# Patient Record
Sex: Male | Born: 1935 | Race: Black or African American | Hispanic: No | Smoking: Never smoker
Health system: Southern US, Community
[De-identification: ages and names within clinical notes are randomized; demographics above are authoritative.]

## PROBLEM LIST (undated history)

## (undated) DIAGNOSIS — M1990 Unspecified osteoarthritis, unspecified site: Secondary | ICD-10-CM

## (undated) DIAGNOSIS — D649 Anemia, unspecified: Secondary | ICD-10-CM

## (undated) DIAGNOSIS — R600 Localized edema: Secondary | ICD-10-CM

## (undated) DIAGNOSIS — E119 Type 2 diabetes mellitus without complications: Secondary | ICD-10-CM

## (undated) DIAGNOSIS — I209 Angina pectoris, unspecified: Secondary | ICD-10-CM

## (undated) DIAGNOSIS — K219 Gastro-esophageal reflux disease without esophagitis: Secondary | ICD-10-CM

## (undated) DIAGNOSIS — D72820 Lymphocytosis (symptomatic): Secondary | ICD-10-CM

## (undated) DIAGNOSIS — C9110 Chronic lymphocytic leukemia of B-cell type not having achieved remission: Secondary | ICD-10-CM

## (undated) DIAGNOSIS — Z8719 Personal history of other diseases of the digestive system: Secondary | ICD-10-CM

## (undated) DIAGNOSIS — Z8489 Family history of other specified conditions: Secondary | ICD-10-CM

## (undated) HISTORY — DX: Chronic lymphocytic leukemia of B-cell type not having achieved remission: C91.10

## (undated) HISTORY — DX: Unspecified osteoarthritis, unspecified site: M19.90

## (undated) HISTORY — PX: EYE SURGERY: SHX253

## (undated) HISTORY — DX: Lymphocytosis (symptomatic): D72.820

## (undated) HISTORY — PX: CATARACT EXTRACTION W/ INTRAOCULAR LENS  IMPLANT, BILATERAL: SHX1307

---

## 1999-01-14 ENCOUNTER — Encounter: Admission: RE | Admit: 1999-01-14 | Discharge: 1999-04-14 | Payer: Self-pay | Admitting: Endocrinology

## 2004-04-04 ENCOUNTER — Ambulatory Visit: Payer: Self-pay | Admitting: Oncology

## 2004-09-16 ENCOUNTER — Encounter (INDEPENDENT_AMBULATORY_CARE_PROVIDER_SITE_OTHER): Payer: Self-pay | Admitting: *Deleted

## 2004-09-16 ENCOUNTER — Ambulatory Visit (HOSPITAL_COMMUNITY): Admission: RE | Admit: 2004-09-16 | Discharge: 2004-09-16 | Payer: Self-pay | Admitting: *Deleted

## 2005-04-03 ENCOUNTER — Ambulatory Visit: Payer: Self-pay | Admitting: Oncology

## 2005-04-06 ENCOUNTER — Ambulatory Visit: Payer: Self-pay | Admitting: Oncology

## 2005-10-01 ENCOUNTER — Ambulatory Visit: Payer: Self-pay | Admitting: Oncology

## 2006-03-31 ENCOUNTER — Ambulatory Visit: Payer: Self-pay | Admitting: Oncology

## 2006-04-05 LAB — COMPREHENSIVE METABOLIC PANEL
Alkaline Phosphatase: 51 U/L (ref 39–117)
Creatinine, Ser: 1.03 mg/dL (ref 0.40–1.50)
Glucose, Bld: 154 mg/dL — ABNORMAL HIGH (ref 70–99)
Sodium: 140 mEq/L (ref 135–145)
Total Bilirubin: 0.3 mg/dL (ref 0.3–1.2)
Total Protein: 6.4 g/dL (ref 6.0–8.3)

## 2006-04-05 LAB — CBC WITH DIFFERENTIAL/PLATELET
Eosinophils Absolute: 0.9 10*3/uL — ABNORMAL HIGH (ref 0.0–0.5)
LYMPH%: 52.1 % — ABNORMAL HIGH (ref 14.0–48.0)
MCHC: 34 g/dL (ref 32.0–35.9)
MCV: 88.3 fL (ref 81.6–98.0)
MONO%: 6.1 % (ref 0.0–13.0)
NEUT#: 3.8 10*3/uL (ref 1.5–6.5)
Platelets: 257 10*3/uL (ref 145–400)
RBC: 4.65 10*6/uL (ref 4.20–5.71)

## 2006-04-05 LAB — LACTATE DEHYDROGENASE: LDH: 136 U/L (ref 94–250)

## 2007-02-16 ENCOUNTER — Emergency Department (HOSPITAL_COMMUNITY): Admission: EM | Admit: 2007-02-16 | Discharge: 2007-02-16 | Payer: Self-pay | Admitting: Emergency Medicine

## 2007-03-31 ENCOUNTER — Ambulatory Visit: Payer: Self-pay | Admitting: Oncology

## 2007-04-04 LAB — CBC WITH DIFFERENTIAL/PLATELET
BASO%: 0.3 % (ref 0.0–2.0)
EOS%: 3.7 % (ref 0.0–7.0)
MCH: 30.8 pg (ref 28.0–33.4)
MCV: 89.3 fL (ref 81.6–98.0)
MONO%: 5.5 % (ref 0.0–13.0)
NEUT#: 2.8 10*3/uL (ref 1.5–6.5)
RBC: 4.51 10*6/uL (ref 4.20–5.71)
RDW: 14.3 % (ref 11.2–14.6)

## 2007-04-04 LAB — COMPREHENSIVE METABOLIC PANEL
AST: 22 U/L (ref 0–37)
Albumin: 4 g/dL (ref 3.5–5.2)
Alkaline Phosphatase: 55 U/L (ref 39–117)
Potassium: 4.3 mEq/L (ref 3.5–5.3)
Sodium: 138 mEq/L (ref 135–145)
Total Protein: 6.2 g/dL (ref 6.0–8.3)

## 2008-03-30 ENCOUNTER — Ambulatory Visit: Payer: Self-pay | Admitting: Oncology

## 2008-04-03 ENCOUNTER — Encounter (HOSPITAL_COMMUNITY): Payer: Self-pay | Admitting: Oncology

## 2008-04-03 ENCOUNTER — Other Ambulatory Visit: Admission: RE | Admit: 2008-04-03 | Discharge: 2008-04-03 | Payer: Self-pay | Admitting: Oncology

## 2008-04-03 LAB — CBC WITH DIFFERENTIAL/PLATELET
Basophils Absolute: 0.1 10*3/uL (ref 0.0–0.1)
EOS%: 2 % (ref 0.0–7.0)
Eosinophils Absolute: 0.4 10*3/uL (ref 0.0–0.5)
HCT: 40.5 % (ref 38.4–49.9)
HGB: 13.7 g/dL (ref 13.0–17.1)
MCH: 30.6 pg (ref 27.2–33.4)
MONO#: 0.8 10*3/uL (ref 0.1–0.9)
NEUT#: 2.9 10*3/uL (ref 1.5–6.5)
NEUT%: 16.4 % — ABNORMAL LOW (ref 39.0–75.0)
RDW: 13.6 % (ref 11.0–14.6)
WBC: 17.9 10*3/uL — ABNORMAL HIGH (ref 4.0–10.3)
lymph#: 13.7 10*3/uL — ABNORMAL HIGH (ref 0.9–3.3)

## 2008-04-03 LAB — COMPREHENSIVE METABOLIC PANEL
Albumin: 4.1 g/dL (ref 3.5–5.2)
Alkaline Phosphatase: 57 U/L (ref 39–117)
BUN: 16 mg/dL (ref 6–23)
CO2: 30 mEq/L (ref 19–32)
Calcium: 9 mg/dL (ref 8.4–10.5)
Chloride: 101 mEq/L (ref 96–112)
Glucose, Bld: 191 mg/dL — ABNORMAL HIGH (ref 70–99)
Potassium: 4.2 mEq/L (ref 3.5–5.3)
Sodium: 141 mEq/L (ref 135–145)
Total Protein: 6.1 g/dL (ref 6.0–8.3)

## 2009-04-02 ENCOUNTER — Ambulatory Visit: Payer: Self-pay | Admitting: Oncology

## 2009-04-04 LAB — CBC WITH DIFFERENTIAL/PLATELET
HCT: 38.6 % (ref 38.4–49.9)
HGB: 13 g/dL (ref 13.0–17.1)
MCHC: 33.7 g/dL (ref 32.0–36.0)
WBC: 30.6 10*3/uL — ABNORMAL HIGH (ref 4.0–10.3)
nRBC: 0 % (ref 0–0)

## 2009-04-04 LAB — LACTATE DEHYDROGENASE: LDH: 153 U/L (ref 94–250)

## 2009-04-04 LAB — MANUAL DIFFERENTIAL
ANC (CHCC manual diff): 4.6 10*3/uL (ref 1.5–6.5)
Band Neutrophils: 0 % (ref 0–10)
Basophil: 0 % (ref 0–2)
Blasts: 0 % (ref 0–0)
PLT EST: ADEQUATE
PROMYELO: 0 % (ref 0–0)
SEG: 15 % — ABNORMAL LOW (ref 38–77)
nRBC: 0 % (ref 0–0)

## 2009-04-04 LAB — COMPREHENSIVE METABOLIC PANEL
ALT: 10 U/L (ref 0–53)
Albumin: 4.3 g/dL (ref 3.5–5.2)
CO2: 30 mEq/L (ref 19–32)
Calcium: 9.3 mg/dL (ref 8.4–10.5)
Chloride: 99 mEq/L (ref 96–112)
Creatinine, Ser: 1.02 mg/dL (ref 0.40–1.50)
Total Protein: 6.4 g/dL (ref 6.0–8.3)

## 2009-10-01 ENCOUNTER — Ambulatory Visit: Payer: Self-pay | Admitting: Oncology

## 2009-10-04 LAB — MANUAL DIFFERENTIAL
ANC (CHCC manual diff): 3.4 10*3/uL (ref 1.5–6.5)
Band Neutrophils: 0 % (ref 0–10)
Blasts: 0 % (ref 0–0)
EOS: 1 % (ref 0–7)
LYMPH: 88 % — ABNORMAL HIGH (ref 14–49)
Other Cell: 0 % (ref 0–0)
PROMYELO: 0 % (ref 0–0)
SEG: 9 % — ABNORMAL LOW (ref 38–77)
nRBC: 0 % (ref 0–0)

## 2009-10-04 LAB — COMPREHENSIVE METABOLIC PANEL
Albumin: 4.1 g/dL (ref 3.5–5.2)
BUN: 15 mg/dL (ref 6–23)
CO2: 27 mEq/L (ref 19–32)
Calcium: 9.1 mg/dL (ref 8.4–10.5)
Chloride: 101 mEq/L (ref 96–112)
Glucose, Bld: 236 mg/dL — ABNORMAL HIGH (ref 70–99)
Potassium: 3.8 mEq/L (ref 3.5–5.3)

## 2009-10-04 LAB — CBC WITH DIFFERENTIAL/PLATELET
HCT: 39.2 % (ref 38.4–49.9)
HGB: 13 g/dL (ref 13.0–17.1)
MCV: 90.9 fL (ref 79.3–98.0)
RDW: 15 % — ABNORMAL HIGH (ref 11.0–14.6)
WBC: 37.4 10*3/uL — ABNORMAL HIGH (ref 4.0–10.3)

## 2010-04-08 ENCOUNTER — Other Ambulatory Visit (HOSPITAL_COMMUNITY): Payer: Self-pay | Admitting: Oncology

## 2010-04-08 ENCOUNTER — Encounter (HOSPITAL_BASED_OUTPATIENT_CLINIC_OR_DEPARTMENT_OTHER): Payer: PRIVATE HEALTH INSURANCE | Admitting: Oncology

## 2010-04-08 DIAGNOSIS — C9110 Chronic lymphocytic leukemia of B-cell type not having achieved remission: Secondary | ICD-10-CM

## 2010-04-08 DIAGNOSIS — Z856 Personal history of leukemia: Secondary | ICD-10-CM

## 2010-04-08 DIAGNOSIS — D72820 Lymphocytosis (symptomatic): Secondary | ICD-10-CM

## 2010-04-08 LAB — COMPREHENSIVE METABOLIC PANEL
ALT: 10 U/L (ref 0–53)
AST: 18 U/L (ref 0–37)
Albumin: 4.4 g/dL (ref 3.5–5.2)
Alkaline Phosphatase: 66 U/L (ref 39–117)
Calcium: 9.5 mg/dL (ref 8.4–10.5)
Chloride: 96 mEq/L (ref 96–112)
Potassium: 3.9 mEq/L (ref 3.5–5.3)
Sodium: 137 mEq/L (ref 135–145)

## 2010-04-08 LAB — CBC WITH DIFFERENTIAL/PLATELET
BASO%: 0.6 % (ref 0.0–2.0)
EOS%: 0.6 % (ref 0.0–7.0)
HGB: 13 g/dL (ref 13.0–17.1)
MCH: 30.4 pg (ref 27.2–33.4)
MCHC: 33.8 g/dL (ref 32.0–36.0)
RBC: 4.26 10*6/uL (ref 4.20–5.82)
RDW: 14.2 % (ref 11.0–14.6)
lymph#: 33.4 10*3/uL — ABNORMAL HIGH (ref 0.9–3.3)

## 2010-04-09 ENCOUNTER — Other Ambulatory Visit (HOSPITAL_COMMUNITY): Payer: Self-pay | Admitting: Oncology

## 2010-04-09 ENCOUNTER — Ambulatory Visit (HOSPITAL_COMMUNITY)
Admission: RE | Admit: 2010-04-09 | Discharge: 2010-04-09 | Disposition: A | Discharge status: Home / Self Care | Payer: Medicare Other | Source: Ambulatory Visit | Attending: Oncology | Admitting: Oncology

## 2010-04-09 DIAGNOSIS — C801 Malignant (primary) neoplasm, unspecified: Secondary | ICD-10-CM

## 2010-04-09 DIAGNOSIS — Z856 Personal history of leukemia: Secondary | ICD-10-CM | POA: Insufficient documentation

## 2010-04-15 ENCOUNTER — Encounter (HOSPITAL_COMMUNITY): Payer: Self-pay

## 2010-04-15 ENCOUNTER — Ambulatory Visit (HOSPITAL_COMMUNITY)
Admission: RE | Admit: 2010-04-15 | Discharge: 2010-04-15 | Disposition: A | Discharge status: Home / Self Care | Payer: Medicare Other | Source: Ambulatory Visit | Attending: Oncology | Admitting: Oncology

## 2010-04-15 DIAGNOSIS — K4090 Unilateral inguinal hernia, without obstruction or gangrene, not specified as recurrent: Secondary | ICD-10-CM | POA: Insufficient documentation

## 2010-04-15 DIAGNOSIS — K5730 Diverticulosis of large intestine without perforation or abscess without bleeding: Secondary | ICD-10-CM | POA: Insufficient documentation

## 2010-04-15 DIAGNOSIS — N281 Cyst of kidney, acquired: Secondary | ICD-10-CM | POA: Insufficient documentation

## 2010-04-15 DIAGNOSIS — R161 Splenomegaly, not elsewhere classified: Secondary | ICD-10-CM | POA: Insufficient documentation

## 2010-04-15 DIAGNOSIS — Z856 Personal history of leukemia: Secondary | ICD-10-CM

## 2010-04-15 DIAGNOSIS — C9110 Chronic lymphocytic leukemia of B-cell type not having achieved remission: Secondary | ICD-10-CM | POA: Insufficient documentation

## 2010-04-15 DIAGNOSIS — N289 Disorder of kidney and ureter, unspecified: Secondary | ICD-10-CM | POA: Insufficient documentation

## 2010-04-15 MED ORDER — IOHEXOL 300 MG/ML  SOLN
100.0000 mL | Freq: Once | INTRAMUSCULAR | Status: AC | PRN
  Administered 2010-04-15: 100 mL via INTRAVENOUS

## 2010-04-15 MED ORDER — IOHEXOL 300 MG/ML  SOLN: 100.0000 mL | Freq: Once | INTRAMUSCULAR | Status: AC | PRN

## 2010-06-13 NOTE — Op Note (Signed)
NAME:  [name], Michael NO.:  [id number]   MEDICAL RECORD NO.:  [id number]          PATIENT TYPE:  AMB   LOCATION:  ENDO                         FACILITY:  [facility]   PHYSICIAN:  Georgiana Spinner, M.D.    DATE OF BIRTH:  1930/06/03   DATE OF PROCEDURE:  09/16/2004  DATE OF DISCHARGE:                                 OPERATIVE REPORT   PROCEDURE:  Colonoscopy.   INDICATION:  Colon cancer screening.   ANESTHESIA:  Demerol 20, Versed 2 milligrams.   PROCEDURE:  With the patient mildly sedated left lateral decubitus position,  a rectal examination was  performed which was unremarkable. The Olympus  videoscopic colonoscope was inserted into the rectum and passed under direct  vision to the cecum identified by ileocecal valve and appendiceal orifice,  both which were photographed.  From this point the colonoscope was slowly  withdrawn taking circumferential views of colonic mucosa stopping only in  the rectum which appeared normal on direct and showed hemorrhoids on  retroflexed view.  The endoscope was straightened withdrawn. The patient's  vital signs and pulse oximeter remained stable. The patient tolerated  procedure well without apparent complication.   FINDINGS:  Internal hemorrhoids, otherwise unremarkable colonoscopic  examination to the cecum.   PLAN:  Consider repeat examination possibly 5 years           ______________________________  Georgiana Spinner, M.D.     GMO/MEDQ  D:  09/16/2004  T:  09/16/2004  Job:  [id number]

## 2010-08-07 ENCOUNTER — Other Ambulatory Visit (HOSPITAL_COMMUNITY): Payer: Self-pay | Admitting: Oncology

## 2010-08-07 ENCOUNTER — Encounter (HOSPITAL_BASED_OUTPATIENT_CLINIC_OR_DEPARTMENT_OTHER): Payer: Medicare Other | Admitting: Oncology

## 2010-08-07 DIAGNOSIS — D72820 Lymphocytosis (symptomatic): Secondary | ICD-10-CM

## 2010-08-07 DIAGNOSIS — C9110 Chronic lymphocytic leukemia of B-cell type not having achieved remission: Secondary | ICD-10-CM

## 2010-08-07 LAB — COMPREHENSIVE METABOLIC PANEL
AST: 13 U/L (ref 0–37)
Albumin: 3.9 g/dL (ref 3.5–5.2)
BUN: 10 mg/dL (ref 6–23)
Calcium: 9.2 mg/dL (ref 8.4–10.5)
Chloride: 101 mEq/L (ref 96–112)
Creatinine, Ser: 0.97 mg/dL (ref 0.50–1.35)
Glucose, Bld: 232 mg/dL — ABNORMAL HIGH (ref 70–99)
Potassium: 4.4 mEq/L (ref 3.5–5.3)

## 2010-08-07 LAB — CBC WITH DIFFERENTIAL/PLATELET
Basophils Absolute: 0.2 10*3/uL — ABNORMAL HIGH (ref 0.0–0.1)
EOS%: 0.9 % (ref 0.0–7.0)
HGB: 12.3 g/dL — ABNORMAL LOW (ref 13.0–17.1)
LYMPH%: 78.5 % — ABNORMAL HIGH (ref 14.0–49.0)
MCH: 30.6 pg (ref 27.2–33.4)
MCV: 90.3 fL (ref 79.3–98.0)
MONO%: 5.4 % (ref 0.0–14.0)
Platelets: 173 10*3/uL (ref 140–400)
RBC: 4.01 10*6/uL — ABNORMAL LOW (ref 4.20–5.82)
RDW: 14.7 % — ABNORMAL HIGH (ref 11.0–14.6)

## 2010-08-07 LAB — LACTATE DEHYDROGENASE: LDH: 162 U/L (ref 94–250)

## 2010-10-09 ENCOUNTER — Other Ambulatory Visit (HOSPITAL_COMMUNITY): Payer: Self-pay | Admitting: Oncology

## 2010-10-09 ENCOUNTER — Encounter (HOSPITAL_BASED_OUTPATIENT_CLINIC_OR_DEPARTMENT_OTHER): Payer: Medicare Other | Admitting: Oncology

## 2010-10-09 DIAGNOSIS — D72820 Lymphocytosis (symptomatic): Secondary | ICD-10-CM

## 2010-10-09 DIAGNOSIS — C9110 Chronic lymphocytic leukemia of B-cell type not having achieved remission: Secondary | ICD-10-CM

## 2010-10-09 LAB — MANUAL DIFFERENTIAL
ALC: 38.5 10*3/uL — ABNORMAL HIGH (ref 0.9–3.3)
ANC (CHCC manual diff): 3.9 10*3/uL (ref 1.5–6.5)
Blasts: 0 % (ref 0–0)
LYMPH: 89 % — ABNORMAL HIGH (ref 14–49)
Metamyelocytes: 0 % (ref 0–0)
Myelocytes: 0 % (ref 0–0)
PROMYELO: 0 % (ref 0–0)
Variant Lymph: 0 % (ref 0–0)

## 2010-10-09 LAB — CBC WITH DIFFERENTIAL/PLATELET
HCT: 36.2 % — ABNORMAL LOW (ref 38.4–49.9)
MCHC: 33.8 g/dL (ref 32.0–36.0)
RBC: 3.99 10*6/uL — ABNORMAL LOW (ref 4.20–5.82)

## 2010-10-17 LAB — CBC
Hemoglobin: 13.5
MCHC: 35
MCV: 87.9
RBC: 4.39
WBC: 15.1 — ABNORMAL HIGH

## 2010-10-17 LAB — URINALYSIS, ROUTINE W REFLEX MICROSCOPIC
Glucose, UA: 100 — AB
Nitrite: NEGATIVE
Specific Gravity, Urine: 1.019
pH: 6

## 2010-10-17 LAB — BASIC METABOLIC PANEL
GFR calc Af Amer: >60
GFR calc non Af Amer: 55 — ABNORMAL LOW
Potassium: 4.1
Sodium: 137

## 2010-10-17 LAB — DIFFERENTIAL
Basophils Relative: 0
Eosinophils Relative: 0
Lymphs Abs: 6.3 — ABNORMAL HIGH
Monocytes Absolute: 0.2
Monocytes Relative: 1 — ABNORMAL LOW
Neutrophils Relative %: 57

## 2010-10-17 LAB — PATHOLOGIST SMEAR REVIEW

## 2010-10-17 LAB — INFLUENZA A+B VIRUS AG-DIRECT(RAPID)
Inflenza A Ag: NEGATIVE
Influenza B Ag: NEGATIVE

## 2010-11-06 ENCOUNTER — Encounter: Payer: Self-pay | Admitting: Nurse Practitioner

## 2010-12-08 ENCOUNTER — Encounter: Payer: Self-pay | Admitting: Oncology

## 2010-12-08 ENCOUNTER — Other Ambulatory Visit (HOSPITAL_COMMUNITY): Payer: Self-pay | Admitting: Oncology

## 2010-12-08 ENCOUNTER — Ambulatory Visit (HOSPITAL_BASED_OUTPATIENT_CLINIC_OR_DEPARTMENT_OTHER): Payer: Medicare Other | Admitting: Oncology

## 2010-12-08 ENCOUNTER — Telehealth: Payer: Self-pay | Admitting: Oncology

## 2010-12-08 ENCOUNTER — Other Ambulatory Visit (HOSPITAL_BASED_OUTPATIENT_CLINIC_OR_DEPARTMENT_OTHER): Payer: Medicare Other | Admitting: Lab

## 2010-12-08 VITALS — BP 136/64 | HR 76 | Temp 97.9°F | Ht 68.5 in | Wt 190.3 lb

## 2010-12-08 DIAGNOSIS — K4090 Unilateral inguinal hernia, without obstruction or gangrene, not specified as recurrent: Secondary | ICD-10-CM

## 2010-12-08 DIAGNOSIS — C9110 Chronic lymphocytic leukemia of B-cell type not having achieved remission: Secondary | ICD-10-CM

## 2010-12-08 DIAGNOSIS — E119 Type 2 diabetes mellitus without complications: Secondary | ICD-10-CM

## 2010-12-08 DIAGNOSIS — D72820 Lymphocytosis (symptomatic): Secondary | ICD-10-CM

## 2010-12-08 DIAGNOSIS — D7289 Other specified disorders of white blood cells: Secondary | ICD-10-CM

## 2010-12-08 DIAGNOSIS — R609 Edema, unspecified: Secondary | ICD-10-CM

## 2010-12-08 HISTORY — DX: Chronic lymphocytic leukemia of B-cell type not having achieved remission: C91.10

## 2010-12-08 LAB — COMPREHENSIVE METABOLIC PANEL
Albumin: 4.1 g/dL (ref 3.5–5.2)
Alkaline Phosphatase: 74 U/L (ref 39–117)
CO2: 30 mEq/L (ref 19–32)
Calcium: 9.6 mg/dL (ref 8.4–10.5)
Chloride: 100 mEq/L (ref 96–112)
Glucose, Bld: 123 mg/dL — ABNORMAL HIGH (ref 70–99)
Potassium: 4.1 mEq/L (ref 3.5–5.3)
Sodium: 140 mEq/L (ref 135–145)
Total Protein: 6.4 g/dL (ref 6.0–8.3)

## 2010-12-08 LAB — CBC WITH DIFFERENTIAL/PLATELET
Basophils Absolute: 0.2 10*3/uL — ABNORMAL HIGH (ref 0.0–0.1)
Eosinophils Absolute: 0.3 10*3/uL (ref 0.0–0.5)
HCT: 39.1 % (ref 38.4–49.9)
HGB: 13.1 g/dL (ref 13.0–17.1)
MONO#: 0.7 10*3/uL (ref 0.1–0.9)
NEUT#: 4.2 10*3/uL (ref 1.5–6.5)
NEUT%: 8.1 % — ABNORMAL LOW (ref 39.0–75.0)
RDW: 14.7 % — ABNORMAL HIGH (ref 11.0–14.6)
lymph#: 46.7 10*3/uL — ABNORMAL HIGH (ref 0.9–3.3)

## 2010-12-08 LAB — URIC ACID: Uric Acid, Serum: 6.4 mg/dL (ref 4.0–7.8)

## 2010-12-08 NOTE — Telephone Encounter (Signed)
gv pt appt schedule for march °

## 2010-12-08 NOTE — Progress Notes (Signed)
This office note has been dictated.  #[id number]

## 2010-12-08 NOTE — Progress Notes (Signed)
CC:   [name], M.D.  HISTORY:  I saw Famous Eisenhardt today for followup of his chronic lymphocytic leukemia confirmed by flow cytometry on 04/03/2008.  We have been following the patient since January 2000 for lymphocytosis.  Mr. Huaracha was last seen by [place] on 08/07/2010.  He denies any changes in his condition, feels generally well.  He is currently 77 years old.  He denies any fever, night sweats, any significant changes in his condition.  In general, he feels well.  Medicines are reviewed and recorded.  The patient sees Dr. Adela Lank for high diabetes mellitus insulin dependent.  That diagnosis goes back to the mid 1970s.  PHYSICAL EXAMINATION:  Mr. Ta looks well.  As stated, he is 77 years old.  Weight is 190 pounds, height 5 feet 8 and 1/2 inches, body surface area 2.06 m squared.  Blood pressure 150/74.  Other vital signs are normal.  There is no scleral icterus.  Mouth and pharynx are benign. There is no peripheral adenopathy palpable.  Heart/lungs:  Normal. Abdomen:  Benign with no organomegaly or masses palpable.  The patient continues to have a left inguinal hernia.  There is 1 to 2+ edema of the lower legs bilaterally.  No petechiae or purpura.  LABORATORY DATA:  Today white count 52.2, ANC 4.2, hemoglobin 13.1, hematocrit 39.1, platelets 169,000.  Absolute lymphocyte count is 46.7, up from 26.0 on 08/07/2010 when the white count was 33.1.  The differential is 8% neutrophils, 90% lymphocytes.  Chemistries from 08/07/2010 were notable for a glucose of 232, otherwise normal.  We have a chest x-ray 2 views from 04/09/2010.  There were no significant findings.  We have a CT scan of the abdomen and pelvis from 04/08/2010. Splenomegaly was evident.  Spleen length was 14 cm, transverse diameter 11 cm and the AP diameter was 6.9 cm.  There were no focal splenic lesions.  Liver was normal in size.  No pathologic lymph nodes were identified in the abdomen or pelvis.   There is a left inguinal/scrotal hernia.  Colonic diverticulosis was also noted.  IMPRESSION AND PLAN:  Mr. Ewalt white count continues to rise along with his ANC.  Nevertheless he feels fine.  I do not think any therapeutic intervention is indicated at this time.  I should mention that Mr. Poage was seen by Dr. Jeani Hawking on 10/27/2010.  He noted that the patient had a normal colonoscopy in 2006.  Dr. Elnoria Howard felt that the patient did not require a followup colonoscopy.  Given his age, he thought that the risks outweighed the benefits.  We will plan to see Mr. Gewirtz in 4 months.  At that time we will check CBC, chemistries and quantitative immunoglobulins.    ______________________________ Samul Dada, M.D. DSM/MEDQ  D:  12/08/2010  T:  12/08/2010  Job:  [id number]

## 2011-04-07 ENCOUNTER — Other Ambulatory Visit (HOSPITAL_BASED_OUTPATIENT_CLINIC_OR_DEPARTMENT_OTHER): Payer: Medicare Other | Admitting: Lab

## 2011-04-07 ENCOUNTER — Ambulatory Visit (HOSPITAL_BASED_OUTPATIENT_CLINIC_OR_DEPARTMENT_OTHER): Payer: Medicare Other | Admitting: Oncology

## 2011-04-07 ENCOUNTER — Encounter: Payer: Self-pay | Admitting: Oncology

## 2011-04-07 ENCOUNTER — Telehealth: Payer: Self-pay | Admitting: Oncology

## 2011-04-07 VITALS — BP 140/69 | HR 75 | Temp 98.2°F | Ht 68.5 in | Wt 192.6 lb

## 2011-04-07 DIAGNOSIS — R609 Edema, unspecified: Secondary | ICD-10-CM

## 2011-04-07 DIAGNOSIS — C9110 Chronic lymphocytic leukemia of B-cell type not having achieved remission: Secondary | ICD-10-CM

## 2011-04-07 LAB — CBC WITH DIFFERENTIAL/PLATELET
MCH: 30.4 pg (ref 27.2–33.4)
MCV: 91.7 fL (ref 79.3–98.0)
Platelets: 216 10*3/uL (ref 140–400)
RBC: 4.11 10*6/uL — ABNORMAL LOW (ref 4.20–5.82)
WBC: 68.9 10*3/uL (ref 4.0–10.3)

## 2011-04-07 LAB — MANUAL DIFFERENTIAL
Band Neutrophils: 0 % (ref 0–10)
EOS: 0 % (ref 0–7)
MONO: 1 % (ref 0–14)
Myelocytes: 0 % (ref 0–0)
Other Cell: 0 % (ref 0–0)
SEG: 6 % — ABNORMAL LOW (ref 38–77)
nRBC: 0 % (ref 0–0)

## 2011-04-07 NOTE — Progress Notes (Signed)
CC:   Michael [name], M.D.  PROBLEM LIST: 1. Chronic lymphocytic leukemia with lymphocytosis, 1st noted in     January 2000.  Confirmatory flow cytometry studies were carried out     on 04/03/2008.  The patient has had a slowly rising white count     without lymphadenopathy.  He was noted to have slight enlargement     of the spleen on CT scans carried out on 04/15/2010 and thus is Rai     stage II.  He remains asymptomatic. 2. Insulin-dependent diabetes mellitus with diagnosis going back to     the mid 1970s. 3. 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 4. 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 5. History of peptic ulcer disease. 6. Hiatal hernia. 7. Osteoarthritis. 8. Recurrent sinus problems related to allergies. 9. Colonic diverticulosis. 10.Chronic edema of the lower legs.  MEDICATIONS: 1. Aspirin 325 mg daily. 2. Donnatal 1 tablet every 8 hours as needed. 3. Glucotrol 10 mg daily. 4. Novolin 70/30, 35 units twice daily with a meal. 5. Metformin 1000 mg twice daily. 6. Prilosec 20 mg daily. 7. Demadex 20 mg daily.  HISTORY:  Michael [name] is seen today for followup of his chronic lymphocytic leukemia dating back to January 2000, when he had just a mild lymphocytosis.  He has had a rising white count, but remains asymptomatic.  Flow cytometry was carried out on 04/03/2008.  This confirmed the diagnosis of chronic lymphocytic leukemia.  The patient is without any complaints today.  His main problem has been some right shoulder pain, undergoing workup and possibly due to a torn rotator cuff.  He has not yet had an MRI, but has had x-rays.  The patient has had no infections.  According to our records, his last pneumonia shot was back on April 28, 1999.  The patient thinks he has had a pneumonia shot since then,.  We will need to check with Dr. Juleen China to see if that is true.  The patient apparently declined a flu shot a few months ago. He does suffer from chronic  edema of the legs.  I suggested that he may want to try some compressive stockings.  PHYSICAL EXAMINATION:  General:  Michael [name] is a generally healthy- appearing gentleman, now 78 years old with birthday in [birth year].  Vital Signs:  Weight is 192.6 pounds, height 5 feet 8-1/2 inches, body surface area 2.05 sq m.  Blood pressure 140/69.  Other vital signs are normal. HEENT:  There is no scleral icterus.  Mouth and pharynx are benign. Lymph:  There is no peripheral adenopathy palpable in the neck, supraclavicular, axillary, or inguinal areas.  Heart and Lungs:  Normal. Abdomen:  Benign with no organomegaly or masses palpable.  There is a left inguinal hernia.  Extremities:  The patient has 2+ edema of both lower legs.  No petechiae or purpura.  Neurologic Exam:  Grossly normal.  LABORATORY DATA:  Today white count 68.9, ANC 12.5, hemoglobin 13, hematocrit 37.7, platelets 216,000.  ANC 4.1, absolute lymphocyte count 64.1, segs were 6%, lymphs 93%.  On 12/08/2010 at the time of the patient's last office visit, his white count was 52.2 and on 09/13 was 43.3.  Chemistries today are pending.  Also, quantitative immunoglobulins are pending.  Chemistries from 12/08/2010 notable for a glucose of 123.  BUN was 15, creatinine 1.02.  Uric acid 6.4, total protein 6.4 with an albumin of 4.1, and thus globulins are 2.3.  Liver function tests are  normal.  IMAGING STUDIES: 1. Chest x-ray, 2-view, from 04/09/2010 showed no acute findings. 2. CT scan of abdomen and pelvis with IV contrast on 04/15/2010 showed     splenomegaly.  Spleen length was 14 cm.  Transverse diameter was 11     cm and AP diameter was 6.9 cm.  There were no focal splenic     lesions.  Liver was normal in size and in enhancement.  There was     diverticulosis of the colon.  There were no pathologically enlarged     lymph nodes identified in the abdomen or pelvis.  There were some     mesenteric lymph nodes and retroperitoneal lymph  nodes that were     seen, but were not felt to be enlarged.  IMPRESSION AND PLAN:  Clinically Michael [name] continues to do well with no symptoms.  His white count is slowly rising and he does have some enlargement of his spleen on CT scan.  However, the spleen is not palpable.  Hemoglobin, hematocrit, and platelets have been fairly stable and are essentially normal.  We are checking quantitative immunoglobulins today.  I suggested the patient might use some compressive stockings and apply them 1st thing in the morning to help deal with his chronic edema of both legs.  We will make inquiry as to the patient's last Pneumovax.  According to our records, Pneumovax was last given on 04/28/1999.  This probably needs to be repeated if he has not had a Pneumovax more recently or previously.  We will check with Dr. Marylen Ponto office.  Michael [name] will return for re-evaluation in 4 months, at which time we will check CBC and chemistries.    ______________________________ Samul Dada, M.D. DSM/MEDQ  D:  04/07/2011  T:  04/07/2011  Job:  [id number]

## 2011-04-07 NOTE — Telephone Encounter (Signed)
gv pt appt schedule for July.  

## 2011-04-07 NOTE — Progress Notes (Signed)
This office note has been dictated.  #[id number]

## 2011-04-08 LAB — IGA: IgA: 84 mg/dL (ref 68–379)

## 2011-04-08 LAB — COMPREHENSIVE METABOLIC PANEL
ALT: 11 U/L (ref 0–53)
AST: 21 U/L (ref 0–37)
CO2: 28 mEq/L (ref 19–32)
Calcium: 9.2 mg/dL (ref 8.4–10.5)
Chloride: 102 mEq/L (ref 96–112)
Sodium: 139 mEq/L (ref 135–145)
Total Protein: 6.1 g/dL (ref 6.0–8.3)

## 2011-04-08 LAB — LACTATE DEHYDROGENASE: LDH: 199 U/L (ref 94–250)

## 2011-08-06 ENCOUNTER — Encounter: Payer: Self-pay | Admitting: Oncology

## 2011-08-06 ENCOUNTER — Ambulatory Visit (HOSPITAL_BASED_OUTPATIENT_CLINIC_OR_DEPARTMENT_OTHER): Payer: Medicare Other | Admitting: Oncology

## 2011-08-06 ENCOUNTER — Other Ambulatory Visit (HOSPITAL_BASED_OUTPATIENT_CLINIC_OR_DEPARTMENT_OTHER): Payer: Medicare Other | Admitting: Lab

## 2011-08-06 VITALS — BP 122/64 | HR 81 | Temp 97.7°F | Ht 68.5 in | Wt 193.3 lb

## 2011-08-06 DIAGNOSIS — C9110 Chronic lymphocytic leukemia of B-cell type not having achieved remission: Secondary | ICD-10-CM

## 2011-08-06 DIAGNOSIS — E119 Type 2 diabetes mellitus without complications: Secondary | ICD-10-CM

## 2011-08-06 LAB — CBC WITH DIFFERENTIAL/PLATELET
BASO%: 0.4 % (ref 0.0–2.0)
Basophils Absolute: 0.3 10*3/uL — ABNORMAL HIGH (ref 0.0–0.1)
EOS%: 0.4 % (ref 0.0–7.0)
HCT: 36.9 % — ABNORMAL LOW (ref 38.4–49.9)
HGB: 12.1 g/dL — ABNORMAL LOW (ref 13.0–17.1)
MCH: 30.1 pg (ref 27.2–33.4)
MCHC: 32.9 g/dL (ref 32.0–36.0)
MCV: 91.4 fL (ref 79.3–98.0)
MONO%: 0.6 % (ref 0.0–14.0)
NEUT%: 5.9 % — ABNORMAL LOW (ref 39.0–75.0)
RDW: 15.2 % — ABNORMAL HIGH (ref 11.0–14.6)
lymph#: 65.6 10*3/uL — ABNORMAL HIGH (ref 0.9–3.3)

## 2011-08-06 NOTE — Progress Notes (Signed)
This office note has been dictated.  #[id number]

## 2011-08-06 NOTE — Progress Notes (Signed)
CC:   [name], M.D.  PROBLEM LIST:  1. Chronic lymphocytic leukemia with lymphocytosis, 1st noted in  January 2000. Confirmatory flow cytometry studies were carried out  on 04/03/2008. The patient has had a slowly rising white count  without lymphadenopathy. He was noted to have slight enlargement  of the spleen on CT scans carried out on 04/15/2010 and thus is Rai  stage II. He remains asymptomatic.  2. Insulin-dependent diabetes mellitus with diagnosis going back to  the mid 1970s.  3. 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  4. 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  5. History of peptic ulcer disease.  6. Hiatal hernia.  7. Osteoarthritis.  8. Recurrent sinus problems related to allergies.  9. Colonic diverticulosis.  10.Chronic edema of the lower legs. 11.Left inguinal hernia, onset 2000.   MEDICATIONS:  1. Aspirin 325 mg daily.  2. Donnatal 1 tablet every 8 hours as needed.  3. Glucotrol 10 mg daily.  4. Novolin 70/30, 35 units twice daily with a meal.  5. Metformin 1000 mg twice daily.  6. Prilosec 20 mg daily.  7. Demadex 20 mg daily.  Pneumovax given 04/28/1999, 03/26/2010.  SMOKING HISTORY:  The patient has never smoked.    HISTORY:  I saw Michael [name] today for followup of his chronic lymphocytic leukemia dating back to January 2000 when he had a mild lymphocytosis.  Over the years his white count has risen but he remains asymptomatic.  Flow studies carried out on 04/03/2008 confirmed the diagnosis of chronic lymphocytic leukemia (CLL).  The patient is without any major complaints today, states that his energy is suboptimal but denies any changes.  He is active around his home.  He occasionally has some night sweats.  Denies any fever or infections.  He bruises easily but denies any bleeding.  He is on aspirin.  He continues to have swelling of his legs and wears compressive stockings with some improvement.  PHYSICAL EXAMINATION:   General:  The patient shows little change.  He will be 78 years old on Monday.  Looks well for his age.  Vital Signs: Weight 193.3 pounds which is stable.  Height 5 feet 8-1/2 inches.  Body surface area 2.06 sq m.  Blood pressure 122/64.  Other vital signs are normal.  HEENT:  There is no scleral icterus.  Mouth and pharynx are benign.  There is no peripheral adenopathy palpable.  Heart/lungs: Normal.  Abdomen:  Benign with no organomegaly or masses palpable.  I could not appreciate any splenomegaly.  There is a large left inguinal hernia, easily reducible and asymptomatic.  Extremities:  2 to 3+ edema in both lower legs.  The patient is wearing compressive stockings.  No petechiae or purpura.  Neurologic:  Normal.  He does not have a Port-A- Cath or central catheter.  LABORATORY DATA:  White count 70.8, ANC 4.1, hemoglobin 12.1, hematocrit 36.9, platelets 161,000.  Absolute lymphocyte count 65.6.  Chemistries today are pending.  Chemistries from 04/07/2011 notable for a glucose of 124.  BUN was 13, creatinine 0.90.  Albumin 4.0, LDH 199.  Quantitative immunoglobulins were notable for an IgM level that was low at 34 with normal being 41-251.  IgG and IgA levels were normal.  IMAGING STUDIES:  1. Chest x-ray, 2-view, from 04/09/2010 showed no acute findings.  2. CT scan of abdomen and pelvis with IV contrast on 04/15/2010 showed  splenomegaly. Spleen length was 14 cm. Transverse diameter was 11  cm and  AP diameter was 6.9 cm. There were no focal splenic  lesions. Liver was normal in size and in enhancement. There was  diverticulosis of the colon. There were no pathologically enlarged  lymph nodes identified in the abdomen or pelvis. There were some  mesenteric lymph nodes and retroperitoneal lymph nodes that were  seen, but were not felt to be enlarged.   IMPRESSION AND PLAN:  Michael [name] continues to do well with essentially stable disease which is asymptomatic.  The patient's last  CT scan of the abdomen and pelvis was on 04/15/2010 and showed mild splenomegaly.  The spleen is not palpable on physical exam.  The patient was advised to obtain a flu shot in the fall.  He and his wife apparently do not get flu shots as a matter of routine. The patient does not require any diagnostic or therapeutic intervention at this time.  We will plan to see Michael [name] again in 4 months at which time we will check CBC and chemistries.  He can be see the mid-level for that appointment.    ______________________________ Samul Dada, M.D. DSM/MEDQ  D:  08/06/2011  T:  08/06/2011  Job:  [id number]

## 2011-08-07 ENCOUNTER — Telehealth: Payer: Self-pay | Admitting: Oncology

## 2011-08-07 LAB — COMPREHENSIVE METABOLIC PANEL
ALT: 11 U/L (ref 0–53)
AST: 18 U/L (ref 0–37)
Alkaline Phosphatase: 83 U/L (ref 39–117)
BUN: 13 mg/dL (ref 6–23)
Chloride: 103 mEq/L (ref 96–112)
Creatinine, Ser: 0.95 mg/dL (ref 0.50–1.35)

## 2011-08-07 NOTE — Telephone Encounter (Signed)
lmonvm for pt confirming appt for 11/11 and mailed schedule.

## 2011-11-27 ENCOUNTER — Telehealth: Payer: Self-pay | Admitting: Oncology

## 2011-11-27 NOTE — Telephone Encounter (Signed)
S/w pt re appt for 11/4. R/s from 11/11.

## 2011-11-30 ENCOUNTER — Other Ambulatory Visit (HOSPITAL_BASED_OUTPATIENT_CLINIC_OR_DEPARTMENT_OTHER): Payer: Medicare Other | Admitting: Lab

## 2011-11-30 ENCOUNTER — Encounter: Payer: Self-pay | Admitting: Family

## 2011-11-30 ENCOUNTER — Ambulatory Visit (HOSPITAL_BASED_OUTPATIENT_CLINIC_OR_DEPARTMENT_OTHER): Payer: Medicare Other | Admitting: Family

## 2011-11-30 ENCOUNTER — Telehealth: Payer: Self-pay | Admitting: Oncology

## 2011-11-30 VITALS — BP 116/82 | HR 86 | Temp 98.7°F | Resp 20 | Ht 68.5 in | Wt 195.0 lb

## 2011-11-30 DIAGNOSIS — C9110 Chronic lymphocytic leukemia of B-cell type not having achieved remission: Secondary | ICD-10-CM

## 2011-11-30 DIAGNOSIS — R161 Splenomegaly, not elsewhere classified: Secondary | ICD-10-CM

## 2011-11-30 LAB — CBC WITH DIFFERENTIAL/PLATELET
Basophils Absolute: 0.1 10*3/uL (ref 0.0–0.1)
EOS%: 0.7 % (ref 0.0–7.0)
Eosinophils Absolute: 0.5 10*3/uL (ref 0.0–0.5)
HCT: 36.9 % — ABNORMAL LOW (ref 38.4–49.9)
HGB: 12 g/dL — ABNORMAL LOW (ref 13.0–17.1)
LYMPH%: 91.8 % — ABNORMAL HIGH (ref 14.0–49.0)
MCH: 29.6 pg (ref 27.2–33.4)
MCV: 91.1 fL (ref 79.3–98.0)
MONO%: 1.9 % (ref 0.0–14.0)
NEUT#: 3.9 10*3/uL (ref 1.5–6.5)
NEUT%: 5.4 % — ABNORMAL LOW (ref 39.0–75.0)
Platelets: 149 10*3/uL (ref 140–400)
RDW: 15.3 % — ABNORMAL HIGH (ref 11.0–14.6)

## 2011-11-30 LAB — COMPREHENSIVE METABOLIC PANEL (CC13)
Alkaline Phosphatase: 94 U/L (ref 40–150)
CO2: 30 mEq/L — ABNORMAL HIGH (ref 22–29)
Creatinine: 0.9 mg/dL (ref 0.7–1.3)
Glucose: 107 mg/dl — ABNORMAL HIGH (ref 70–99)
Sodium: 140 mEq/L (ref 136–145)
Total Bilirubin: 0.23 mg/dL (ref 0.20–1.20)
Total Protein: 6.6 g/dL (ref 6.4–8.3)

## 2011-11-30 LAB — TECHNOLOGIST REVIEW

## 2011-11-30 LAB — LACTATE DEHYDROGENASE (CC13): LDH: 215 U/L (ref 125–220)

## 2011-11-30 NOTE — Patient Instructions (Addendum)
Remember to wear compression stockings as much as possible for your leg swelling. Please elevate your legs as much as possible to help alleviate some of the swelling. Please contact us if you have any questions or concerns.

## 2011-11-30 NOTE — Telephone Encounter (Signed)
gv and printed pt appt schedule for March 2014 °

## 2011-11-30 NOTE — Progress Notes (Signed)
Patient ID: Michael [name], male   DOB: Mar 26, 1930, 78 y.o.   MRN: [id number] CSN: [id number]  CC: [name], M.D.   Problem List: Michael [name] is a 78 y.o. African-American male with a problem list consisting of:  1. Chronic lymphocytic leukemia with lymphocytosis, first noted in January 2000. Confirmatory flow cytometry studies were carried out on 04/03/2008. The patient has had a slowly rising white count without lymphadenopathy. He was noted to have slight enlargement of the spleen on CT scans carried out on 04/15/2010 and thus is Rai stage II. He remains asymptomatic.  2. Insulin-dependent diabetes mellitus with diagnosis going back to the mid 1970s.  3. 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 History of peptic ulcer disease 6. Hiatal hernia 7. Osteoarthritis 8. Recurrent sinus problems related to allergies 9. Colonic diverticulosis 10.Chronic edema of the lower legs 11.Left inguinal hernia, onset 2000 12.Pneumovax given 04/28/1999, 03/26/2010.  Dr. Arline Asp and I saw Mr. [name] today for follow up of his chronic lymphocytic leukemia dating back to January 2000 when he had a mild lymphocytosis. Flow studies carried out on 04/03/2008 confirmed the diagnosis of chronic lymphocytic leukemia (CLL). Over the years his white count has risen but he remains asymptomatic.The patient is without any major complaints today.   His energy level remains suboptimal but denies any changes. He continues to have swelling of his legs and wears compressive stockings with some improvement.  He is active around his home and in his church. He denies any other symptomatology including night sweats, fever, unusual bleeding, chills, N/V/D or constipation. He bruises easily but denies any bleeding. He is on a daily aspirin regimen.     Past Medical History: Past Medical History  Diagnosis Date  . Diabetes mellitus   . cll dx'd 2000    no  rx  . Lymphocytosis   . CLL (chronic lymphoblastic leukemia)   . Arthritis   . Leukemia, chronic lymphoid 12/08/2010    Surgical History: No past surgical history on file.  Current Medications: Current Outpatient Prescriptions  Medication Sig Dispense Refill  . aspirin 325 MG tablet Take 325 mg by mouth daily.        Marland Kitchen atropine-PHENObarbital-scopolamine-hyoscyamine (DONNATAL) 16.2 MG tablet Take 1 tablet by mouth every 8 (eight) hours as needed.        Marland Kitchen glipiZIDE (GLUCOTROL) 10 MG tablet Take 10 mg by mouth daily.       . insulin NPH-insulin regular (NOVOLIN 70/30) (70-30) 100 UNIT/ML injection Inject 35 Units into the skin 2 (two) times daily with a meal. Sliding scale      . metFORMIN (GLUCOPHAGE) 1000 MG tablet Take 1,000 mg by mouth 2 (two) times daily with a meal.       . omeprazole (PRILOSEC) 20 MG capsule Take 20 mg by mouth daily.        . Tamsulosin HCl (FLOMAX) 0.4 MG CAPS Take 0.4 mg by mouth daily.      Marland Kitchen torsemide (DEMADEX) 20 MG tablet Take 20 mg by mouth daily.        . [DISCONTINUED] insulin aspart protamine-insulin aspart (NOVOLOG 70/30) (70-30) 100 UNIT/ML injection Inject 35 Units into the skin 2 (two) times daily with a meal.       . [DISCONTINUED] pantoprazole (PROTONIX) 40 MG tablet Take 40 mg by mouth daily.        . [DISCONTINUED] sitaGLIPtin (JANUVIA) 100 MG tablet Take 100 mg by mouth  daily.          Allergies: No Known Allergies  Family History: Family History  Problem Relation Age of Onset  . Cancer Mother   . Hypertension Father   . Diabetes Father     Social History: History  Substance Use Topics  . Smoking status: Never Smoker   . Smokeless tobacco: Current User    Types: Snuff  . Alcohol Use: No    Review of Systems: 10 Point review of systems was completed and is negative except as noted above.   Physical Exam:   Blood pressure 116/82, pulse 86, temperature 98.7 F (37.1 C), temperature source Oral, resp. rate 20, height 5' 8.5"  (1.74 m), weight 195 lb (88.451 kg).  General appearance: Alert, cooperative, well nourished, no apparent distress and no distress Head: Normocephalic, without obvious abnormality, atraumatic Eyes: Conjunctivae/corneas clear, arcus senilis, PERRLA, EOMI Nose: Nares, septum and mucosa are normal, no drainage, maxillary and frontal sinus tenderness Neck: No adenopathy, supple, symmetrical, trachea midline, thyroid not enlarged, no tenderness Resp: Clear to auscultation bilaterally, diminished bibasilar breath sounds Cardio: Regular rate and rhythm, S1, S2 normal, no murmur, click, rub or gallop GI: Soft, non-tender, distended, hypoactive bowel sounds, no organomegaly Extremities: Extremities atraumatic, no cyanosis, bilateral LE +3 edema, bilateral LE skin lacerations Lymph nodes: Cervical, supraclavicular, and axillary nodes normal Neurologic: Grossly normal   Laboratory Data: Results for orders placed in visit on 11/30/11 (from the past 48 hour(s))  CBC WITH DIFFERENTIAL     Status: Abnormal   Collection Time   11/30/11 12:26 PM      Component Value Range Comment   WBC 73.1 (*) 4.0 - 10.3 10e3/uL    NEUT# 3.9  1.5 - 6.5 10e3/uL    HGB 12.0 (*) 13.0 - 17.1 g/dL    HCT 16.1 (*) 09.6 - 49.9 %    Platelets 149  140 - 400 10e3/uL    MCV 91.1  79.3 - 98.0 fL    MCH 29.6  27.2 - 33.4 pg    MCHC 32.5  32.0 - 36.0 g/dL    RBC 0.45 (*) 4.09 - 5.82 10e6/uL    RDW 15.3 (*) 11.0 - 14.6 %    lymph# 67.2 (*) 0.9 - 3.3 10e3/uL    MONO# 1.4 (*) 0.1 - 0.9 10e3/uL    Eosinophils Absolute 0.5  0.0 - 0.5 10e3/uL    Basophils Absolute 0.1  0.0 - 0.1 10e3/uL    NEUT% 5.4 (*) 39.0 - 75.0 %    LYMPH% 91.8 (*) 14.0 - 49.0 %    MONO% 1.9  0.0 - 14.0 %    EOS% 0.7  0.0 - 7.0 %    BASO% 0.2  0.0 - 2.0 %   COMPREHENSIVE METABOLIC PANEL (CC13)     Status: Abnormal   Collection Time   11/30/11 12:26 PM      Component Value Range Comment   Sodium 140  136 - 145 mEq/L    Potassium 4.1  3.5 - 5.1 mEq/L     Chloride 103  98 - 107 mEq/L    CO2 30 (*) 22 - 29 mEq/L    Glucose 107 (*) 70 - 99 mg/dl    BUN 81.1  7.0 - 91.4 mg/dL    Creatinine 0.9  0.7 - 1.3 mg/dL    Total Bilirubin 7.82  0.20 - 1.20 mg/dL    Alkaline Phosphatase 94  40 - 150 U/L    AST 18  5 -  34 U/L    ALT 12  0 - 55 U/L    Total Protein 6.6  6.4 - 8.3 g/dL    Albumin 3.7  3.5 - 5.0 g/dL    Calcium 9.6  8.4 - 16.1 mg/dL   LACTATE DEHYDROGENASE (CC13)     Status: Normal   Collection Time   11/30/11 12:26 PM      Component Value Range Comment   LDH 215  125 - 220 U/L   TECHNOLOGIST REVIEW     Status: Normal   Collection Time   11/30/11 12:26 PM      Component Value Range Comment   Technologist Review Variant lymphs present, moderate smudge cells        Imaging Studies: 1. Chest x-ray, 2-view, from 04/09/2010 showed no acute findings.  2. CT scan of abdomen and pelvis with IV contrast on 04/15/2010 showed splenomegaly. Spleen length was 14 cm. Transverse diameter was 11 cm and AP diameter was 6.9 cm. There were no focal splenic lesions. Liver was normal in size and in enhancement. There was diverticulosis of the colon. There were no pathologically enlarged lymph nodes identified in the abdomen or pelvis. There were some mesenteric lymph nodes and retroperitoneal lymph nodes that were seen, but were not felt to be enlarged   Impression/Plan: Mr. Tangonan continues to do well with essentially stable disease.  He remains asymptomatic. The patient's last CT scan of the abdomen and pelvis was on 04/15/2010 and showed mild splenomegaly. The spleen is not palpable on physical exam. The patient was advised to obtain a flu shot this fall, but when he recently had an annual physical examination with his PCP, he declined the influenza vaccination.  Dr. Arline Asp states the patient does not require any diagnostic or therapeutic intervention at this time.   We will plan to see Mr. Loveday again in 4 months at which time we will check CBC  and chemistries. Mr. Kalafut is asked to contact us in the interim if he has any questions or concerns.   Larina Bras, NP-C 11/30/2011, 12:31 PM

## 2011-12-07 ENCOUNTER — Ambulatory Visit: Payer: Medicare Other | Admitting: Oncology

## 2011-12-07 ENCOUNTER — Other Ambulatory Visit: Payer: Medicare Other | Admitting: Lab

## 2012-03-07 ENCOUNTER — Telehealth: Payer: Self-pay | Admitting: Oncology

## 2012-03-07 NOTE — Telephone Encounter (Signed)
s.w. pt and advised pt that time of appt changed

## 2012-03-28 ENCOUNTER — Ambulatory Visit (HOSPITAL_BASED_OUTPATIENT_CLINIC_OR_DEPARTMENT_OTHER): Payer: Medicare Other | Admitting: Oncology

## 2012-03-28 ENCOUNTER — Encounter: Payer: Self-pay | Admitting: Oncology

## 2012-03-28 ENCOUNTER — Ambulatory Visit: Payer: Medicare Other | Admitting: Oncology

## 2012-03-28 ENCOUNTER — Other Ambulatory Visit: Payer: Medicare Other | Admitting: Lab

## 2012-03-28 ENCOUNTER — Telehealth: Payer: Self-pay | Admitting: Oncology

## 2012-03-28 ENCOUNTER — Other Ambulatory Visit (HOSPITAL_BASED_OUTPATIENT_CLINIC_OR_DEPARTMENT_OTHER): Payer: Medicare Other | Admitting: Lab

## 2012-03-28 VITALS — BP 131/66 | HR 73 | Temp 98.0°F | Resp 18 | Ht 68.5 in | Wt 199.5 lb

## 2012-03-28 DIAGNOSIS — C9110 Chronic lymphocytic leukemia of B-cell type not having achieved remission: Secondary | ICD-10-CM

## 2012-03-28 LAB — CBC WITH DIFFERENTIAL/PLATELET
BASO%: 0.3 % (ref 0.0–2.0)
EOS%: 0.5 % (ref 0.0–7.0)
MCH: 29.5 pg (ref 27.2–33.4)
MCHC: 32.2 g/dL (ref 32.0–36.0)
RDW: 16.5 % — ABNORMAL HIGH (ref 11.0–14.6)
lymph#: 85.3 10*3/uL — ABNORMAL HIGH (ref 0.9–3.3)

## 2012-03-28 LAB — COMPREHENSIVE METABOLIC PANEL (CC13)
AST: 21 U/L (ref 5–34)
Albumin: 3.5 g/dL (ref 3.5–5.0)
Alkaline Phosphatase: 91 U/L (ref 40–150)
Potassium: 4.5 mEq/L (ref 3.5–5.1)
Sodium: 140 mEq/L (ref 136–145)
Total Protein: 6.5 g/dL (ref 6.4–8.3)

## 2012-03-28 LAB — LACTATE DEHYDROGENASE (CC13): LDH: 221 U/L (ref 125–245)

## 2012-03-28 NOTE — Progress Notes (Signed)
This office note has been dictated.  #[id number]

## 2012-03-28 NOTE — Progress Notes (Signed)
CC:   Michael [name], M.D.  PROBLEM LIST:  1. Chronic lymphocytic leukemia with lymphocytosis, 1st noted in  January 2000. Confirmatory flow cytometry studies were carried out  on 04/03/2008. The patient has had a slowly rising white count  without lymphadenopathy. He was noted to have slight enlargement  of the spleen on CT scans carried out on 04/15/2010 and thus is Rai  stage II. He remains asymptomatic.  2. Insulin-dependent diabetes mellitus with diagnosis going back to  the mid 1970s.  3. 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  4. 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  5. History of peptic ulcer disease.  6. Hiatal hernia.  7. Osteoarthritis.  8. Recurrent sinus problems related to allergies.  9. Colonic diverticulosis.  10. Chronic edema of the lower legs.  11. Left inguinal hernia, onset 2000.   MEDICATIONS:  Reviewed and recorded. Current Outpatient Prescriptions  Medication Sig Dispense Refill  . aspirin 325 MG tablet Take 325 mg by mouth daily.        Marland Kitchen atropine-PHENObarbital-scopolamine-hyoscyamine (DONNATAL) 16.2 MG tablet Take 1 tablet by mouth every 8 (eight) hours as needed.        Marland Kitchen glipiZIDE (GLUCOTROL) 10 MG tablet Take 10 mg by mouth daily.       . insulin NPH-insulin regular (NOVOLIN 70/30) (70-30) 100 UNIT/ML injection Inject 35 Units into the skin 2 (two) times daily with a meal. Sliding scale      . metFORMIN (GLUCOPHAGE) 1000 MG tablet Take 1,000 mg by mouth 2 (two) times daily with a meal.       . ramipril (ALTACE) 5 MG tablet Take 5 mg by mouth daily.      . Tamsulosin HCl (FLOMAX) 0.4 MG CAPS Take 0.4 mg by mouth daily.      Marland Kitchen torsemide (DEMADEX) 20 MG tablet Take 20 mg by mouth daily.        . [DISCONTINUED] insulin aspart protamine-insulin aspart (NOVOLOG 70/30) (70-30) 100 UNIT/ML injection Inject 35 Units into the skin 2 (two) times daily with a meal.       . [DISCONTINUED] pantoprazole (PROTONIX) 40 MG tablet Take 40 mg by mouth  daily.        . [DISCONTINUED] sitaGLIPtin (JANUVIA) 100 MG tablet Take 100 mg by mouth daily.         No current facility-administered medications for this visit.    IMMUNIZATIONS: Pneumovax given 04/28/1999, 03/26/2010. Flu shot has been discussed in the past.  Patient declines.   SMOKING HISTORY: The patient has never smoked.   HISTORY:  Michael [name] was seen today for followup of his chronic lymphocytic leukemia dating back to January 2000 when he had a mild lymphocytosis.  Over the years, his white count has risen, but he remains asymptomatic.  The patient was last seen by [place] on 08/06/2011.  His major problem appears to be year-long allergies.  He denies any symptoms from his CLL, specifically infections, fever, night sweats, bleeding, or bruising problems.  He has chronic fatigue.  He will be turning 82 in July.  In general, he has been doing well without any major health problems since his last visit.  Patient was last seen by [place] on 11/30/2011 and prior to that on 08/06/2011.  PHYSICAL EXAMINATION:  Patient shows little change.  Basically looks well for his age, soon to be 43.  Weight is 199 pounds 8 ounces.  Height 5 feet 8-1/2 inches.  Body surface area 2.06 meters squared.  Blood pressure 131/66.  Other vital signs are normal.  HEENT: There is no scleral icterus. Mouth and pharynx are benign. There is no peripheral adenopathy palpable. Heart/lungs: Normal. Abdomen: Benign with no organomegaly or masses palpable. I could not appreciate any splenomegaly. There is a large left inguinal hernia, easily reducible and asymptomatic. Extremities: 2 to 3+ edema  in both lower legs. The patient is wearing compressive stockings. No  petechiae or purpura. Neurologic: Normal. He does not have a Port-A-  Cath or central catheter.   LABORATORY DATA:  White count is 70.0 with an ANC of 3.7 and an absolute lymphocyte count of 85.3.  There are 4% neutrophils, 95%  lymphocytes. Hemoglobin 11.5, hematocrit 35.7, platelets 148,000.  On 11/30/2011 white count was 73.1 and on 08/06/2011 70.8.  Chemistries from today were normal except for a glucose of 171.  Albumin was 3.5, LDH 221. Quantitative immunoglobulins on 08/06/2011 showed an IgG level of 841, IgA of 79, and an IgM of 38.  IMAGING STUDIES:  1. Chest x-ray, 2-view, from 04/09/2010 showed no acute findings.  2. CT scan of abdomen and pelvis with IV contrast on 04/15/2010 showed  splenomegaly. Spleen length was 14 cm. Transverse diameter was 11  cm and AP diameter was 6.9 cm. There were no focal splenic  lesions. Liver was normal in size and in enhancement. There was  diverticulosis of the colon. There were no pathologically enlarged  lymph nodes identified in the abdomen or pelvis. There were some  mesenteric lymph nodes and retroperitoneal lymph nodes that were  seen, but were not felt to be enlarged.   IMPRESSION AND PLAN: Mr. Michael [name] continues to do well with clinically stable disease, although his white count is rising.  The patient's last CT scan of the abdomen and pelvis on 04/15/2010 showed a mild splenomegaly.  Spleen is not palpable on physical exam.  We will check a CBC in 3 months.  We will plan to see Mr. Michael [name] again in 6 months, at which time we will check CBC and chemistries.  The patient does not require any diagnostic or therapeutic intervention at this time.    ______________________________ Samul Dada, M.D. DSM/MEDQ  D:  03/28/2012  T:  03/28/2012  Job:  [id number]

## 2012-03-28 NOTE — Telephone Encounter (Signed)
Gave pt appt for lab and MD on June 2014 and September labs and MD

## 2012-06-29 ENCOUNTER — Other Ambulatory Visit (HOSPITAL_BASED_OUTPATIENT_CLINIC_OR_DEPARTMENT_OTHER): Payer: Medicare Other | Admitting: Lab

## 2012-06-29 ENCOUNTER — Encounter: Payer: Self-pay | Admitting: Oncology

## 2012-06-29 DIAGNOSIS — C9110 Chronic lymphocytic leukemia of B-cell type not having achieved remission: Secondary | ICD-10-CM

## 2012-06-29 LAB — MANUAL DIFFERENTIAL
Band Neutrophils: 0 % (ref 0–10)
EOS: 0 % (ref 0–7)
LYMPH: 95 % — ABNORMAL HIGH (ref 14–49)
MONO: 0 % (ref 0–14)
Other Cell: 0 % (ref 0–0)
SEG: 5 % — ABNORMAL LOW (ref 38–77)
nRBC: 0 % (ref 0–0)

## 2012-06-29 LAB — CBC WITH DIFFERENTIAL/PLATELET
HCT: 36.8 % — ABNORMAL LOW (ref 38.4–49.9)
HGB: 11.7 g/dL — ABNORMAL LOW (ref 13.0–17.1)
MCV: 93.6 fL (ref 79.3–98.0)
Platelets: 130 10*3/uL — ABNORMAL LOW (ref 140–400)
RBC: 3.93 10*6/uL — ABNORMAL LOW (ref 4.20–5.82)
WBC: 102.6 10*3/uL (ref 4.0–10.3)

## 2012-06-29 NOTE — Progress Notes (Signed)
My dictated note from 03/28/2012 contains an error regarding the white count which really was 90,000, not 70,000 as stated.

## 2012-09-29 ENCOUNTER — Ambulatory Visit (HOSPITAL_BASED_OUTPATIENT_CLINIC_OR_DEPARTMENT_OTHER): Payer: Medicare Other | Admitting: Internal Medicine

## 2012-09-29 ENCOUNTER — Encounter: Payer: Self-pay | Admitting: Internal Medicine

## 2012-09-29 ENCOUNTER — Other Ambulatory Visit (HOSPITAL_BASED_OUTPATIENT_CLINIC_OR_DEPARTMENT_OTHER): Payer: Medicare Other | Admitting: Lab

## 2012-09-29 ENCOUNTER — Telehealth: Payer: Self-pay | Admitting: Internal Medicine

## 2012-09-29 VITALS — BP 120/61 | HR 78 | Temp 97.1°F | Resp 18 | Ht 68.5 in | Wt 189.3 lb

## 2012-09-29 DIAGNOSIS — D72820 Lymphocytosis (symptomatic): Secondary | ICD-10-CM

## 2012-09-29 DIAGNOSIS — R5381 Other malaise: Secondary | ICD-10-CM

## 2012-09-29 DIAGNOSIS — M7989 Other specified soft tissue disorders: Secondary | ICD-10-CM

## 2012-09-29 DIAGNOSIS — C9110 Chronic lymphocytic leukemia of B-cell type not having achieved remission: Secondary | ICD-10-CM

## 2012-09-29 LAB — CBC WITH DIFFERENTIAL/PLATELET
Basophils Absolute: 0.2 10*3/uL — ABNORMAL HIGH (ref 0.0–0.1)
EOS%: 0.5 % (ref 0.0–7.0)
LYMPH%: 93.6 % — ABNORMAL HIGH (ref 14.0–49.0)
MCH: 30.4 pg (ref 27.2–33.4)
MCV: 93.7 fL (ref 79.3–98.0)
MONO%: 1.3 % (ref 0.0–14.0)
RBC: 3.8 10*6/uL — ABNORMAL LOW (ref 4.20–5.82)
RDW: 16 % — ABNORMAL HIGH (ref 11.0–14.6)

## 2012-09-29 LAB — COMPREHENSIVE METABOLIC PANEL (CC13)
Alkaline Phosphatase: 89 U/L (ref 40–150)
Creatinine: 1 mg/dL (ref 0.7–1.3)
Glucose: 205 mg/dl — ABNORMAL HIGH (ref 70–140)
Sodium: 139 mEq/L (ref 136–145)
Total Bilirubin: 0.28 mg/dL (ref 0.20–1.20)
Total Protein: 6.4 g/dL (ref 6.4–8.3)

## 2012-09-29 LAB — LACTATE DEHYDROGENASE (CC13): LDH: 224 U/L (ref 125–245)

## 2012-09-29 NOTE — Progress Notes (Signed)
Hematology and Oncology Follow Up Visit  Michael [name] [id number] 04-Nov-1930 79 y.o. 09/29/2012 10:35 PM Michael Sites, MD   Principle Diagnosis: Chronic Lymphoid Leukemia  Prior Therapy: None  Current therapy: None  Interim History:  Michael [name] was seen today for followup of his chronic lymphocytic leukemia dating back to January 2000 when he had a mild lymphocytosis.  Over the years, his white count has risen, but he remains asymptomatic. He was last seen by Dr. Arline Asp on 03/28/2012.  His major problem appears to be year-long allergies. Again, he denies any symptoms from his CLL, specifically infections, fever, night sweats, bleeding, or bruising problems or early saiteity.  He has chronic fatigue. He reports eating well over the summer with some weight gain.  In addition, he still has swelling in his ankles and lower extremities at the end of day.   He denies recent hospitalizations and reports compliance to medicines.   Medications: I have reviewed the patient's current medications.  Current Outpatient Prescriptions  Medication Sig Dispense Refill  . aspirin 325 MG tablet Take 325 mg by mouth daily.        Marland Kitchen atropine-PHENObarbital-scopolamine-hyoscyamine (DONNATAL) 16.2 MG tablet Take 1 tablet by mouth every 8 (eight) hours as needed.        . cephALEXin (KEFLEX) 500 MG capsule 4 (four) times daily.       Marland Kitchen glipiZIDE (GLUCOTROL) 10 MG tablet Take 10 mg by mouth 2 (two) times daily before a meal.       . insulin NPH-insulin regular (NOVOLIN 70/30) (70-30) 100 UNIT/ML injection Inject 35 Units into the skin 2 (two) times daily with a meal. Sliding scale      . metFORMIN (GLUCOPHAGE) 1000 MG tablet Take 1,000 mg by mouth 2 (two) times daily with a meal.       . ramipril (ALTACE) 5 MG tablet Take 5 mg by mouth daily.      . Tamsulosin HCl (FLOMAX) 0.4 MG CAPS Take 0.4 mg by mouth daily.      Marland Kitchen torsemide (DEMADEX) 20 MG tablet Take 20 mg by mouth daily.        . [DISCONTINUED]  insulin aspart protamine-insulin aspart (NOVOLOG 70/30) (70-30) 100 UNIT/ML injection Inject 35 Units into the skin 2 (two) times daily with a meal.       . [DISCONTINUED] pantoprazole (PROTONIX) 40 MG tablet Take 40 mg by mouth daily.        . [DISCONTINUED] sitaGLIPtin (JANUVIA) 100 MG tablet Take 100 mg by mouth daily.         No current facility-administered medications for this visit.     Allergies: No Known Allergies  Past Medical History, Surgical history, Social history, and Family History were reviewed and updated.  Review of Systems: Constitutional:  Negative for fever, chills, night sweats, anorexia, weight loss, pain. Cardiovascular: no chest pain or dyspnea on exertion Respiratory: no cough, shortness of breath, or wheezing Neurological: no TIA or stroke symptoms Dermatological: negative for rash ENT: negative for - epistaxis Skin: Negative. Gastrointestinal: no abdominal pain, change in bowel habits, or black or bloody stools early satiety Genito-Urinary: no dysuria, trouble voiding, or hematuria Hematological and Lymphatic: negative for - bleeding problems or blood clots Musculoskeletal: negative for - gait disturbance Remaining ROS negative. Physical Exam: Blood pressure 120/61, pulse 78, temperature 97.1 F (36.2 C), temperature source Oral, resp. rate 18, height 5' 8.5" (1.74 m), weight 189 lb 4.8 oz (85.866 kg). ECOG:  0/1 General appearance: alert, cooperative, no distress  and mildly obese Head: Normocephalic, without obvious abnormality, atraumatic Neck: no adenopathy, supple, symmetrical, trachea midline and thyroid not enlarged, symmetric, no tenderness/mass/nodules Lymph nodes: Cervical, supraclavicular, and axillary nodes normal. Heart:regular rate and rhythm, S1, S2 normal, no murmur, click, rub or gallop Lung:chest clear, no wheezing, rales, normal symmetric air entry, Heart exam - S1, S2 normal, no murmur, no gallop, rate regular Abdomin: soft,  non-tender, without masses or organomegaly Left inguinal hernia, easily reducible and asymptomatic EXT:2+ edema bilaterally  Lab Results: Lab Results  Component Value Date   WBC 88.0* 09/29/2012   HGB 11.5* 09/29/2012   HCT 35.6* 09/29/2012   MCV 93.7 09/29/2012   PLT 146 09/29/2012     Chemistry      Component Value Date/Time   NA 139 09/29/2012 1018   NA 140 08/06/2011 1411   K 4.4 09/29/2012 1018   K 4.1 08/06/2011 1411   CL 104 03/28/2012 1426   CL 103 08/06/2011 1411   CO2 25 09/29/2012 1018   CO2 28 08/06/2011 1411   BUN 14.4 09/29/2012 1018   BUN 13 08/06/2011 1411   CREATININE 1.0 09/29/2012 1018   CREATININE 0.95 08/06/2011 1411      Component Value Date/Time   CALCIUM 9.4 09/29/2012 1018   CALCIUM 9.0 08/06/2011 1411   ALKPHOS 89 09/29/2012 1018   ALKPHOS 83 08/06/2011 1411   AST 28 09/29/2012 1018   AST 18 08/06/2011 1411   ALT 15 09/29/2012 1018   ALT 11 08/06/2011 1411   BILITOT 0.28 09/29/2012 1018   BILITOT 0.3 08/06/2011 1411      Radiological Studies: 1. Chest x-ray, 2-view, from 04/09/2010 showed no acute findings.  2. CT scan of abdomen and pelvis with IV contrast on 04/15/2010 showed splenomegaly. Spleen length was 14 cm. Transverse diameter was 11  cm and AP diameter was 6.9 cm. There were no focal splenic lesions. Liver was normal in size and in enhancement. There was  diverticulosis of the colon. There were no pathologically enlarged lymph nodes identified in the abdomen or pelvis. There were some  mesenteric lymph nodes and retroperitoneal lymph nodes that were seen, but were not felt to be enlarged.  Impression and Plan: 1. Chronic lymphocytic leukemia with lymphocytosis, 1st noted in January 2000. Confirmatory flow cytometry studies were carried out  on 04/03/2008. The patient has had a slowly rising white count without lymphadenopathy. He was noted to have slight enlargement  of the spleen on CT scans carried out on 04/15/2010 and thus is Rai stage II. He remains asymptomatic. He  continues to do well with clinically stable disease, although his white count is rising over the past year. Today's WBC value of 88K is lower than 92K collected at [facility] on 09/16/2012 and lower than 102K collected in June,2014.  Over the past six months his lymphocytes have not doubled and he has not developed any symptoms.    However, given his overall increasing trend, we  will check a CBC in 3 months.  We will  Also plan to see Mr. Vidales again at that time to assess for the presence of symptoms.   We will also obtain a CXR.   If he develops symptoms (i.e., enlarging spleen resulting in early saiety)  and/or his white count continues to increase in the presence of anemia or thrombocytopenia, we will obtain a CT of his abdomen and pelvis.  His physiological age supports he should tolarate treatment.   Spent more than half the  time coordinating care. Patient provided a CLL handout.    Sohail Capraro, MD 9/4/201410:35 PM

## 2012-09-29 NOTE — Patient Instructions (Addendum)
1. Patient should return for CXR, labs prior to visit in 3 months. 2. Call us with any additional symptoms.  Chronic Lymphocytic Leukemia Chronic lymphocytic leukemia (CLL) is a type of cancer of the bone marrow and blood cells. Bone marrow is the soft, spongy tissue inside your bone. In CLL, the bone marrow makes too many white blood cells that usually fight infection in the body (lymphocytes). CLL is the most common type of adult leukemia.  CAUSES  No one knows the exact cause of CLL. There is a higher risk of CLL in people who:   Are over 35 years of age.  Are white.  Are male.  Have a family history of CLL or other cancers of the lymph system.  Are of [place] or [place] European Jewish descent.  Have been exposed to certain chemicals, such as Agent Orange (used in the [place] War) or other herbicides or insecticides. SYMPTOMS  At first, some people do not have symptoms of chronic lymphocytic leukemia. After a while, people may notice some symptoms, such as:   Feeling more tired than usual, even after rest.  Unplanned weight loss.  Heavy sweating at night.  Fevers.  Shortness of breath.  Decreased energy.  Paleness.  Painless, swollen lymph nodes.  A feeling of fullness in the upper left part of the abdomen.  Easy bruising and/or bleeding.  More frequent infections. DIAGNOSIS   During a physical exam, your caregiver may notice an enlarged spleen, liver and/or lymph nodes.  Blood and bone marrow tests are performed to identify the presence of cancer cells.  A CT scan may be done to look for swelling or abnormalities in your spleen, liver, and lymph nodes. TREATMENT  Treatment options for CLL depend on the stage and the presence of symptoms. There are a number of types of treatment used for this condition, including:  Targeted drugs. These are drugs that interfere with chemicals that leukemia cells need in order to grow and multiply.  Chemotherapy drugs.  These medications kill cells that are multiplying quickly, such as leukemia cells.  Biological therapy. This treatment boosts the ability of the patient's own immune system to fight the leukemia cells.  Bone marrow or peripheral blood stem cell transplant. This treatment allows the patient to receive very high doses of chemotherapy and/or radiation. These high doses kill the cancer cells, but also destroy the bone marrow. After treatment is complete, the patient is given donor bone marrow or stem cells, which will replace the bone marrow. HOME CARE INSTRUCTIONS   Because you have an increased risk of infection, practice good hand washing and avoid being around people who are ill or crowded places.  Because you have an increased risk of bleeding and bruising, avoid contact sports or other rough activities.  Only take over-the-counter or prescription medicines for pain, discomfort or fever as directed by your caregiver.  Although some of your treatments might affect your appetite, try to eat regular, healthy meals.  If you develop any side effects, such as nausea, diarrhea, rash, white patches in your mouth, a sore throat, difficulty swallowing, or severe fatigue, tell your caregiver. He or she may have recommendations of things you can do to improve symptoms.  Consider learning some ways to cope with the stress of having a chronic illness, such as yoga, meditation, or participating in a support group. SEEK IMMEDIATE MEDICAL CARE IF:  You develop an unexplained oral temperature of 102 F (38.9 C) or more.  You develop chest  pains.  You develop a severe stiff neck or headache.  You have trouble breathing or feel short of breath.  You feel very lightheaded or pass out.  You notice pain, swelling or redness anywhere in your legs.  You have pain in your belly (abdomen).  You develop new bruises that are getting bigger.  You have painful or more swollen lymph nodes.  You develop  bleeding from your gums, nose, or in your urine or stools.  You are unable to stop throwing up (vomiting).  You cannot keep liquids down.  You feel depressed. Document Released: 05/31/2008 Document Revised: 04/06/2011 Document Reviewed: 05/31/2008 [facility] Patient Information 2014 [place], [place].

## 2012-09-29 NOTE — Telephone Encounter (Signed)
gv and printed appt sched and avs for Dec....pt aware of xray

## 2012-12-29 ENCOUNTER — Telehealth: Payer: Self-pay | Admitting: Internal Medicine

## 2012-12-29 ENCOUNTER — Ambulatory Visit (HOSPITAL_COMMUNITY): Payer: Medicare Other

## 2012-12-29 NOTE — Telephone Encounter (Signed)
pt was scheduled w/CP2 12/9 and moved to NG. this is a [facility] pt and was moved to CP1. s/w pt confirming appt for lb/fu 12/9 @ 10:30am. pt will also do his cxr that same day @ 9:30am. lmonvm for Sheralyn Boatman in [place] informing her that pt did not have cxr today and was planning on coming 12/9 same say as f/u about 9:30am (walk in appt

## 2013-01-03 ENCOUNTER — Ambulatory Visit (HOSPITAL_BASED_OUTPATIENT_CLINIC_OR_DEPARTMENT_OTHER): Payer: Medicare Other | Admitting: Internal Medicine

## 2013-01-03 ENCOUNTER — Ambulatory Visit (HOSPITAL_COMMUNITY)
Admission: RE | Admit: 2013-01-03 | Discharge: 2013-01-03 | Disposition: A | Discharge status: Home / Self Care | Payer: Medicare Other | Source: Ambulatory Visit | Attending: Internal Medicine | Admitting: Internal Medicine

## 2013-01-03 ENCOUNTER — Other Ambulatory Visit (HOSPITAL_BASED_OUTPATIENT_CLINIC_OR_DEPARTMENT_OTHER): Payer: Medicare Other | Admitting: Lab

## 2013-01-03 VITALS — BP 117/64 | HR 73 | Temp 96.8°F | Resp 18 | Ht 68.5 in | Wt 188.7 lb

## 2013-01-03 DIAGNOSIS — C9110 Chronic lymphocytic leukemia of B-cell type not having achieved remission: Secondary | ICD-10-CM

## 2013-01-03 LAB — COMPREHENSIVE METABOLIC PANEL (CC13)
Albumin: 3.5 g/dL (ref 3.5–5.0)
Alkaline Phosphatase: 86 U/L (ref 40–150)
BUN: 14.3 mg/dL (ref 7.0–26.0)
CO2: 27 mEq/L (ref 22–29)
Calcium: 9.1 mg/dL (ref 8.4–10.4)
Chloride: 104 mEq/L (ref 98–109)
Glucose: 133 mg/dl (ref 70–140)
Potassium: 4.4 mEq/L (ref 3.5–5.1)
Sodium: 142 mEq/L (ref 136–145)
Total Protein: 6.4 g/dL (ref 6.4–8.3)

## 2013-01-03 LAB — CBC WITH DIFFERENTIAL/PLATELET
Basophils Absolute: 0.2 10*3/uL — ABNORMAL HIGH (ref 0.0–0.1)
Eosinophils Absolute: 0.3 10*3/uL (ref 0.0–0.5)
HGB: 11.2 g/dL — ABNORMAL LOW (ref 13.0–17.1)
MONO#: 0.5 10*3/uL (ref 0.1–0.9)
MONO%: 0.5 % (ref 0.0–14.0)
NEUT#: 3.4 10*3/uL (ref 1.5–6.5)
RBC: 3.63 10*6/uL — ABNORMAL LOW (ref 4.20–5.82)
RDW: 15.3 % — ABNORMAL HIGH (ref 11.0–14.6)
WBC: 91.5 10*3/uL (ref 4.0–10.3)
lymph#: 87 10*3/uL — ABNORMAL HIGH (ref 0.9–3.3)

## 2013-01-03 LAB — TECHNOLOGIST REVIEW

## 2013-01-03 NOTE — Patient Instructions (Signed)
Chronic Lymphocytic Leukemia Chronic lymphocytic leukemia (CLL) is a type of cancer of the bone marrow and blood cells. Bone marrow is the soft, spongy tissue inside your bone. In CLL, the bone marrow makes too many white blood cells that usually fight infection in the body (lymphocytes). CLL usually gets worse slowly and is the most common type of adult leukemia.  RISK FACTORS No one knows the exact cause of CLL. There is a higher risk of CLL in people who:   Are older than 50 years.  Are white.  Are male.  Have a family history of CLL or other cancers of the lymph system.  Are of [place] Jewish or Eastern European Jewish descent.  Have been exposed to certain chemicals, such as Agent Orange (used in the [place] War) or other herbicides or insecticides. SYMPTOMS  At first, there may be no symptoms of chronic lymphocytic leukemia. After a while, some symptoms may occur, such as:   Feeling more tired than usual, even after rest.  Unplanned weight loss.  Heavy sweating at night.  Fevers.  Shortness of breath.  Decreased energy.  Paleness.  Painless, swollen lymph nodes.  A feeling of fullness in the upper left part of the abdomen.  Easy bruising or bleeding.  More frequent infections. DIAGNOSIS  Your health care provider may perform the following exams and tests to diagnose CLL:  Physical exam to check for an enlarged spleen, liver, or lymph nodes.  Blood and bone marrow tests to identify the presence of cancer cells. These may include tests such as complete blood count, flow cytometry, immunophenotyping, and fluorescence in situ hybridization (FISH).  CT scan to look for swelling or abnormalities in your spleen, liver, and lymph nodes. TREATMENT  Treatment options for CLL depend on the stage and the presence of symptoms. There are a number of types of treatment used for this condition, including:  Observation.  Targeted drugs. These are drugs that interfere with  chemicals that leukemia cells need in order to grow and multiply. They identify and attack specific cancer cells without harming normal cells.  Chemotherapy drugs. These medicines kill cells that are multiplying quickly, such as leukemia cells.  Radiation.  Surgery to remove the spleen.  Biological therapy. This treatment boosts the ability of your own immune system to fight the leukemia cells.  Bone marrow or peripheral blood stem cell transplant. This treatment allows the patient to receive very high doses of chemotherapy and/or radiation. These high doses kill the cancer cells but also destroy the bone marrow. After treatment is complete, you are given donor bone marrow or stem cells, which will replace the bone marrow. HOME CARE INSTRUCTIONS   Because you have an increased risk of infection, practice good hand washing and avoid being around people who are ill or being in crowded places.  Because you have an increased risk of bleeding and bruising, avoid contact sports or other rough activities.  Only take over-the-counter or prescription medicines for pain, discomfort, or fever as directed by your health care provider.  Although some of your treatments might affect your appetite, try to eat regular, healthy meals.  If you develop any side effects, such as nausea, diarrhea, rash, white patches in your mouth, a sore throat, difficulty swallowing, or severe fatigue, tell your health care provider. He or she may have recommendations of things you can do to improve symptoms.  Consider learning some ways to cope with the stress of having a chronic illness, such as yoga, meditation,   or participating in a support group. SEEK MEDICAL CARE IF:  You develop chest pains.  You notice pain, swelling or redness anywhere in your legs.  You have pain in your belly (abdomen).  You develop new bruises that are getting bigger.  You have painful or more swollen lymph nodes.  You develop bleeding  from your gums, nose, or in your urine or stools.  You are unable to stop throwing up (vomiting).  You cannot keep liquids down.  You feel lightheaded.  You have a fever or persistent symptoms for more than 2 3 days.  You develop a severe stiff neck or headache. SEEK IMMEDIATE MEDICAL CARE IF:  You have trouble breathing or feel short of breath.  You faint. Document Released: 05/31/2008 Document Revised: 09/14/2012 Document Reviewed: 07/07/2012 [employer] Patient Information 2014 [employer].  

## 2013-01-04 ENCOUNTER — Telehealth: Payer: Self-pay | Admitting: Internal Medicine

## 2013-01-05 NOTE — Progress Notes (Signed)
Hematology and Oncology Follow Up Visit  Michael [name] [id number] 12-14-1930 79 y.o. 01/05/2013 7:37 AM Michael Sites, MD   Chief Complaint: Chronic Lymphoid Leukemia  Principle Diagnosis: Chronic Lymphoid Leukemia  Prior Therapy: None  Current therapy: None  Interim History:  Michael [name] was seen today for followup of his chronic lymphocytic leukemia dating back to January 2000 when he had a mildlymphocytosis.  Over the years, his white count has risen, but he remains asymptomatic. He was last seen by me on 09/29/2012.  His major problem appears to be year-long allergies. Again, he denies any symptoms from his CLL, specifically infections, fever, night sweats, bleeding, or bruising problems or early saiteity.  He has chronic fatigue. He reports eating well.  In addition, he still has swelling in his ankles and lower extremities at the end of day.   He denies recent hospitalizations and reports compliance to medicines.   Medications: I have reviewed the patient's current medications.  Current Outpatient Prescriptions  Medication Sig Dispense Refill  . aspirin 325 MG tablet Take 325 mg by mouth daily.        Marland Kitchen atropine-PHENObarbital-scopolamine-hyoscyamine (DONNATAL) 16.2 MG tablet Take 1 tablet by mouth every 8 (eight) hours as needed.        . cephALEXin (KEFLEX) 500 MG capsule 4 (four) times daily.       Marland Kitchen glipiZIDE (GLUCOTROL) 10 MG tablet Take 10 mg by mouth 2 (two) times daily before a meal.       . insulin NPH-insulin regular (NOVOLIN 70/30) (70-30) 100 UNIT/ML injection Inject 35 Units into the skin 2 (two) times daily with a meal. Sliding scale      . metFORMIN (GLUCOPHAGE) 1000 MG tablet Take 1,000 mg by mouth 2 (two) times daily with a meal.       . ramipril (ALTACE) 5 MG tablet Take 5 mg by mouth daily.      . Tamsulosin HCl (FLOMAX) 0.4 MG CAPS Take 0.4 mg by mouth daily.      Marland Kitchen torsemide (DEMADEX) 20 MG tablet Take 20 mg by mouth daily.        . [DISCONTINUED]  insulin aspart protamine-insulin aspart (NOVOLOG 70/30) (70-30) 100 UNIT/ML injection Inject 35 Units into the skin 2 (two) times daily with a meal.       . [DISCONTINUED] pantoprazole (PROTONIX) 40 MG tablet Take 40 mg by mouth daily.        . [DISCONTINUED] sitaGLIPtin (JANUVIA) 100 MG tablet Take 100 mg by mouth daily.         No current facility-administered medications for this visit.     Allergies: No Known Allergies  Past Medical History, Surgical history, Social history, and Family History were reviewed and updated.  Review of Systems: Constitutional:  Negative for fever, chills, night sweats, anorexia, weight loss, pain. Cardiovascular: no chest pain or dyspnea on exertion Respiratory: no cough, shortness of breath, or wheezing Neurological: no TIA or stroke symptoms Dermatological: negative for rash ENT: negative for - epistaxis Skin: Negative. Gastrointestinal: no abdominal pain, change in bowel habits, or black or bloody stools early satiety Genito-Urinary: no dysuria, trouble voiding, or hematuria Hematological and Lymphatic: negative for - bleeding problems or blood clots Musculoskeletal: negative for - gait disturbance Remaining ROS negative. Physical Exam: Blood pressure 117/64, pulse 73, temperature 96.8 F (36 C), temperature source Oral, resp. rate 18, height 5' 8.5" (1.74 m), weight 188 lb 11.2 oz (85.594 kg). ECOG:  0/1 General appearance: alert, cooperative, no distress and mildly obese  Head: Normocephalic, without obvious abnormality, atraumatic Neck: no adenopathy, supple, symmetrical, trachea midline and thyroid not enlarged, symmetric, no tenderness/mass/nodules Lymph nodes: Cervical, supraclavicular, and axillary nodes normal. Heart:regular rate and rhythm, S1, S2 normal, no murmur, click, rub or gallop Lung:chest clear, no wheezing, rales, normal symmetric air entry, Heart exam - S1, S2 normal, no murmur, no gallop, rate regular Abdomin: soft,  non-tender, without masses or organomegaly Left inguinal hernia, easily reducible and asymptomatic EXT:2+ edema bilaterally  Lab Results: Lab Results  Component Value Date   WBC 91.5* 01/03/2013   HGB 11.2* 01/03/2013   HCT 34.7* 01/03/2013   MCV 95.6 01/03/2013   PLT 148 01/03/2013     Chemistry      Component Value Date/Time   NA 142 01/03/2013 1037   NA 140 08/06/2011 1411   K 4.4 01/03/2013 1037   K 4.1 08/06/2011 1411   CL 104 03/28/2012 1426   CL 103 08/06/2011 1411   CO2 27 01/03/2013 1037   CO2 28 08/06/2011 1411   BUN 14.3 01/03/2013 1037   BUN 13 08/06/2011 1411   CREATININE 1.1 01/03/2013 1037   CREATININE 0.95 08/06/2011 1411      Component Value Date/Time   CALCIUM 9.1 01/03/2013 1037   CALCIUM 9.0 08/06/2011 1411   ALKPHOS 86 01/03/2013 1037   ALKPHOS 83 08/06/2011 1411   AST 21 01/03/2013 1037   AST 18 08/06/2011 1411   ALT 13 01/03/2013 1037   ALT 11 08/06/2011 1411   BILITOT 0.23 01/03/2013 1037   BILITOT 0.3 08/06/2011 1411     Radiological Studies: 1. Chest x-ray, 2-view, from 04/09/2010 showed no acute findings.  2. CT scan of abdomen and pelvis with IV contrast on 04/15/2010 showed splenomegaly. Spleen length was 14 cm. Transverse diameter was 11 cm and AP diameter was 6.9 cm. There were no focal splenic lesions. Liver was normal in size and in enhancement. There was diverticulosis of the colon. There were no pathologically enlarged lymph nodes identified in the abdomen or pelvis. There were some mesenteric lymph nodes and retroperitoneal lymph nodes that were seen, but were not felt to be enlarged.  Impression and Plan: 1. Chronic lymphocytic leukemia with lymphocytosis, 1st noted in January 2000.  --  He remains asymptomatic. He continues to do well with clinically stable disease, although his white count is rising over the past year. --Confirmatory flow cytometry studies were carried out on 04/03/2008. The patient has had a slowly rising white count without  lymphadenopathy. He was noted to have slight enlargement  of the spleen on CT scans carried out on 04/15/2010 and thus is Rai stage II. He remains asymptomatic.  --Today's WBC value of 91.5 K.   Stable from 88K last visit and 92K collected at [facility] on 09/16/2012 and lower than 102K collected in June,2014.  Over the past six months his lymphocytes have not doubled and he has not developed any symptoms.  -- However, given his overall increasing trend, we  will check a CBC in 3 months.  We will  Also plan to see Michael [name] again at that time to assess for the presence of symptoms.    If he develops symptoms (i.e., enlarging spleen resulting in early saiety)  and/or his white count continues to increase in the presence of anemia or thrombocytopenia, we will obtain a CT of his abdomen and pelvis.  His physiological age supports he should tolorate treatment.   Spent more than half the time coordinating care.  Michael Acree, MD 12/11/20147:37 AM

## 2013-04-03 ENCOUNTER — Other Ambulatory Visit (HOSPITAL_BASED_OUTPATIENT_CLINIC_OR_DEPARTMENT_OTHER): Payer: Medicare Other

## 2013-04-03 DIAGNOSIS — C9110 Chronic lymphocytic leukemia of B-cell type not having achieved remission: Secondary | ICD-10-CM

## 2013-04-03 LAB — CBC WITH DIFFERENTIAL/PLATELET
BASO%: 0.2 % (ref 0.0–2.0)
BASOS ABS: 0.2 10*3/uL — AB (ref 0.0–0.1)
EOS%: 0.3 % (ref 0.0–7.0)
Eosinophils Absolute: 0.2 10*3/uL (ref 0.0–0.5)
HCT: 37.6 % — ABNORMAL LOW (ref 38.4–49.9)
HEMOGLOBIN: 11.9 g/dL — AB (ref 13.0–17.1)
LYMPH%: 95.1 % — AB (ref 14.0–49.0)
MCH: 30.3 pg (ref 27.2–33.4)
MCHC: 31.7 g/dL — ABNORMAL LOW (ref 32.0–36.0)
MCV: 95.6 fL (ref 79.3–98.0)
MONO#: 0.7 10*3/uL (ref 0.1–0.9)
MONO%: 0.8 % (ref 0.0–14.0)
NEUT%: 3.6 % — ABNORMAL LOW (ref 39.0–75.0)
NEUTROS ABS: 3 10*3/uL (ref 1.5–6.5)
PLATELETS: 133 10*3/uL — AB (ref 140–400)
RBC: 3.94 10*6/uL — AB (ref 4.20–5.82)
RDW: 15.8 % — AB (ref 11.0–14.6)
WBC: 82.5 10*3/uL — AB (ref 4.0–10.3)
lymph#: 78.5 10*3/uL — ABNORMAL HIGH (ref 0.9–3.3)

## 2013-04-03 LAB — TECHNOLOGIST REVIEW

## 2013-07-03 ENCOUNTER — Other Ambulatory Visit (HOSPITAL_BASED_OUTPATIENT_CLINIC_OR_DEPARTMENT_OTHER): Payer: Medicare Other

## 2013-07-03 ENCOUNTER — Telehealth: Payer: Self-pay | Admitting: Internal Medicine

## 2013-07-03 ENCOUNTER — Ambulatory Visit (HOSPITAL_BASED_OUTPATIENT_CLINIC_OR_DEPARTMENT_OTHER): Payer: Medicare Other | Admitting: Internal Medicine

## 2013-07-03 ENCOUNTER — Encounter: Payer: Self-pay | Admitting: Internal Medicine

## 2013-07-03 VITALS — BP 113/74 | HR 66 | Temp 98.1°F | Resp 20 | Ht 68.5 in | Wt 185.3 lb

## 2013-07-03 DIAGNOSIS — C9110 Chronic lymphocytic leukemia of B-cell type not having achieved remission: Secondary | ICD-10-CM

## 2013-07-03 DIAGNOSIS — R5381 Other malaise: Secondary | ICD-10-CM

## 2013-07-03 DIAGNOSIS — D72820 Lymphocytosis (symptomatic): Secondary | ICD-10-CM

## 2013-07-03 DIAGNOSIS — R5383 Other fatigue: Secondary | ICD-10-CM

## 2013-07-03 LAB — COMPREHENSIVE METABOLIC PANEL (CC13)
ALT: 10 U/L (ref 0–55)
ANION GAP: 8 meq/L (ref 3–11)
AST: 20 U/L (ref 5–34)
Albumin: 3.6 g/dL (ref 3.5–5.0)
Alkaline Phosphatase: 71 U/L (ref 40–150)
BILIRUBIN TOTAL: 0.32 mg/dL (ref 0.20–1.20)
BUN: 13.9 mg/dL (ref 7.0–26.0)
CO2: 29 meq/L (ref 22–29)
CREATININE: 1 mg/dL (ref 0.7–1.3)
Calcium: 9 mg/dL (ref 8.4–10.4)
Chloride: 105 mEq/L (ref 98–109)
GLUCOSE: 141 mg/dL — AB (ref 70–140)
Potassium: 4.4 mEq/L (ref 3.5–5.1)
Sodium: 142 mEq/L (ref 136–145)
Total Protein: 6.3 g/dL — ABNORMAL LOW (ref 6.4–8.3)

## 2013-07-03 LAB — CBC WITH DIFFERENTIAL/PLATELET
BASO%: 0.2 % (ref 0.0–2.0)
Basophils Absolute: 0.2 10e3/uL — ABNORMAL HIGH (ref 0.0–0.1)
EOS%: 0.3 % (ref 0.0–7.0)
Eosinophils Absolute: 0.3 10e3/uL (ref 0.0–0.5)
HCT: 35.7 % — ABNORMAL LOW (ref 38.4–49.9)
HGB: 11.5 g/dL — ABNORMAL LOW (ref 13.0–17.1)
LYMPH%: 93.7 % — ABNORMAL HIGH (ref 14.0–49.0)
MCH: 31 pg (ref 27.2–33.4)
MCHC: 32.3 g/dL (ref 32.0–36.0)
MCV: 96.1 fL (ref 79.3–98.0)
MONO#: 1.5 10e3/uL — ABNORMAL HIGH (ref 0.1–0.9)
MONO%: 1.9 % (ref 0.0–14.0)
NEUT#: 3 10e3/uL (ref 1.5–6.5)
NEUT%: 3.9 % — ABNORMAL LOW (ref 39.0–75.0)
Platelets: 148 10e3/uL (ref 140–400)
RBC: 3.71 10e6/uL — ABNORMAL LOW (ref 4.20–5.82)
RDW: 15.1 % — ABNORMAL HIGH (ref 11.0–14.6)
WBC: 78 10e3/uL (ref 4.0–10.3)
lymph#: 73.1 10e3/uL — ABNORMAL HIGH (ref 0.9–3.3)

## 2013-07-03 LAB — LACTATE DEHYDROGENASE (CC13): LDH: 205 U/L (ref 125–245)

## 2013-07-03 LAB — TECHNOLOGIST REVIEW

## 2013-07-03 NOTE — Telephone Encounter (Signed)
Gave  pt appt for lab on Sept and December lab and MD

## 2013-07-03 NOTE — Progress Notes (Signed)
Hematology and Oncology Follow Up Visit  Michael [name] [id number] 05-02-1930 80 y.o. 07/03/2013 10:46 AM Dwan Bolt, MD   Chief Complaint: Chronic Lymphoid Leukemia  Principle Diagnosis: Chronic Lymphoid Leukemia  Prior Therapy: None  Current therapy: None  Interim History:  Michael [name] was seen today for followup of his chronic lymphocytic leukemia dating back to January 2000 when he had a mild lymphocytosis.  Over the years, his white count has risen, but he remains asymptomatic. He was last seen by me on 01/03/2013.  His major problem appears to be year-long allergies. Again, he denies any symptoms from his CLL, specifically infections, fever, night sweats, bleeding, or bruising problems or early saiteity.  He has chronic fatigue. He reports eating well.  He denies recent hospitalizations and reports compliance to medicines. He will see Dr. Wilson Singer next week on 07/07/2013.   Medications: I have reviewed the patient's current medications.  Current Outpatient Prescriptions  Medication Sig Dispense Refill  . aspirin 325 MG tablet Take 325 mg by mouth daily.        Marland Kitchen atropine-PHENObarbital-scopolamine-hyoscyamine (DONNATAL) 16.2 MG tablet Take 1 tablet by mouth every 8 (eight) hours as needed.        Marland Kitchen glipiZIDE (GLUCOTROL) 10 MG tablet Take 10 mg by mouth 2 (two) times daily before a meal.       . insulin NPH-insulin regular (NOVOLIN 70/30) (70-30) 100 UNIT/ML injection Inject 35 Units into the skin 2 (two) times daily with a meal. Sliding scale      . metFORMIN (GLUCOPHAGE) 1000 MG tablet Take 1,000 mg by mouth 2 (two) times daily with a meal.       . ONE TOUCH ULTRA TEST test strip       . ramipril (ALTACE) 5 MG tablet Take 5 mg by mouth daily.      . Tamsulosin HCl (FLOMAX) 0.4 MG CAPS Take 0.4 mg by mouth daily.      Marland Kitchen torsemide (DEMADEX) 20 MG tablet Take 20 mg by mouth daily.        . [DISCONTINUED] insulin aspart protamine-insulin aspart (NOVOLOG 70/30) (70-30) 100  UNIT/ML injection Inject 35 Units into the skin 2 (two) times daily with a meal.       . [DISCONTINUED] pantoprazole (PROTONIX) 40 MG tablet Take 40 mg by mouth daily.        . [DISCONTINUED] sitaGLIPtin (JANUVIA) 100 MG tablet Take 100 mg by mouth daily.         No current facility-administered medications for this visit.     Allergies: No Known Allergies  Past Medical History, Surgical history, Social history, and Family History were reviewed and updated.  Review of Systems: Constitutional:  Negative for fever, chills, night sweats, anorexia, weight loss, pain. Cardiovascular: no chest pain or dyspnea on exertion Respiratory: no cough, shortness of breath, or wheezing Neurological: no TIA or stroke symptoms Dermatological: negative for rash ENT: negative for - epistaxis Skin: Negative. Gastrointestinal: no abdominal pain, change in bowel habits, or black or bloody stools early satiety Genito-Urinary: no dysuria, trouble voiding, or hematuria Hematological and Lymphatic: negative for - bleeding problems or blood clots Musculoskeletal: negative for - gait disturbance Remaining ROS negative. Physical Exam: Blood pressure 113/74, pulse 66, temperature 98.1 F (36.7 C), temperature source Oral, resp. rate 20, height 5' 8.5" (1.74 m), weight 185 lb 4.8 oz (84.052 kg). ECOG:  0/1 General appearance: alert, cooperative, no distress and mildly obese Head: Normocephalic, without obvious abnormality, atraumatic Neck: no adenopathy, supple, symmetrical, trachea  midline and thyroid not enlarged, symmetric, no tenderness/mass/nodules Lymph nodes: Cervical, supraclavicular, and axillary nodes normal. Heart:regular rate and rhythm, S1, S2 normal, no murmur, click, rub or gallop Lung:chest clear, no wheezing, rales, normal symmetric air entry, Heart exam - S1, S2 normal, no murmur, no gallop, rate regular Abdomin: soft, non-tender, without masses or organomegaly Left inguinal hernia, easily  reducible and asymptomatic EXT:2+ edema bilaterally  Lab Results: Lab Results  Component Value Date   WBC 78.0* 07/03/2013   HGB 11.5* 07/03/2013   HCT 35.7* 07/03/2013   MCV 96.1 07/03/2013   PLT 148 07/03/2013     Chemistry      Component Value Date/Time   NA 142 01/03/2013 1037   NA 140 08/06/2011 1411   K 4.4 01/03/2013 1037   K 4.1 08/06/2011 1411   CL 104 03/28/2012 1426   CL 103 08/06/2011 1411   CO2 27 01/03/2013 1037   CO2 28 08/06/2011 1411   BUN 14.3 01/03/2013 1037   BUN 13 08/06/2011 1411   CREATININE 1.1 01/03/2013 1037   CREATININE 0.95 08/06/2011 1411      Component Value Date/Time   CALCIUM 9.1 01/03/2013 1037   CALCIUM 9.0 08/06/2011 1411   ALKPHOS 86 01/03/2013 1037   ALKPHOS 83 08/06/2011 1411   AST 21 01/03/2013 1037   AST 18 08/06/2011 1411   ALT 13 01/03/2013 1037   ALT 11 08/06/2011 1411   BILITOT 0.23 01/03/2013 1037   BILITOT 0.3 08/06/2011 1411     Radiological Studies: 1. Chest x-ray, 2-view, from 04/09/2010 showed no acute findings.  2. CT scan of abdomen and pelvis with IV contrast on 04/15/2010 showed splenomegaly. Spleen length was 14 cm. Transverse diameter was 11 cm and AP diameter was 6.9 cm. There were no focal splenic lesions. Liver was normal in size and in enhancement. There was diverticulosis of the colon. There were no pathologically enlarged lymph nodes identified in the abdomen or pelvis. There were some mesenteric lymph nodes and retroperitoneal lymph nodes that were seen, but were not felt to be enlarged.  Impression and Plan: 1. Chronic lymphocytic leukemia with lymphocytosis, 1st noted in January 2000.  --  He remains asymptomatic. He continues to do well with clinically stable disease, although his white count is rising over the past year. --Confirmatory flow cytometry studies were carried out on 04/03/2008. The patient has had a slowly rising white count without lymphadenopathy. He was noted to have slight enlargement  of the spleen on CT scans carried  out on 04/15/2010 and thus is Rai stage II. He remains asymptomatic.  --Today's WBC value of 78 is stable from 82.5 K three months ago.    Stable from 91.5K last visit and 88K collected on 09/29/2012.  Over the past six months his lymphocytes have not doubled and he has not developed any symptoms.  -- However, given his overall increasing trend, we  will check a CBC in 3 months.  We will  Also plan to see Michael [name] again at that time to assess for the presence of symptoms.    If he develops symptoms (i.e., enlarging spleen resulting in early saiety)  and/or his white count continues to increase in the presence of anemia or thrombocytopenia, we will obtain a CT of his abdomen and pelvis.  His physiological age supports he should tolorate treatment.   Spent more than half the time coordinating care.    Concha Norway, MD 6/8/201510:46 AM

## 2013-10-04 ENCOUNTER — Other Ambulatory Visit (HOSPITAL_BASED_OUTPATIENT_CLINIC_OR_DEPARTMENT_OTHER): Payer: Medicare Other

## 2013-10-04 DIAGNOSIS — C9110 Chronic lymphocytic leukemia of B-cell type not having achieved remission: Secondary | ICD-10-CM

## 2013-10-04 LAB — CBC WITH DIFFERENTIAL/PLATELET
BASO%: 1 % (ref 0.0–2.0)
BASOS ABS: 0.8 10*3/uL — AB (ref 0.0–0.1)
EOS%: 0.3 % (ref 0.0–7.0)
Eosinophils Absolute: 0.3 10*3/uL (ref 0.0–0.5)
HEMATOCRIT: 36.1 % — AB (ref 38.4–49.9)
HEMOGLOBIN: 11.3 g/dL — AB (ref 13.0–17.1)
LYMPH%: 94 % — AB (ref 14.0–49.0)
MCH: 30.8 pg (ref 27.2–33.4)
MCHC: 31.3 g/dL — ABNORMAL LOW (ref 32.0–36.0)
MCV: 98.4 fL — AB (ref 79.3–98.0)
MONO#: 0.4 10*3/uL (ref 0.1–0.9)
MONO%: 0.6 % (ref 0.0–14.0)
NEUT#: 3.3 10*3/uL (ref 1.5–6.5)
NEUT%: 4.1 % — AB (ref 39.0–75.0)
PLATELETS: 159 10*3/uL (ref 140–400)
RBC: 3.67 10*6/uL — ABNORMAL LOW (ref 4.20–5.82)
RDW: 15.5 % — ABNORMAL HIGH (ref 11.0–14.6)
WBC: 79.1 10*3/uL (ref 4.0–10.3)
lymph#: 74.3 10*3/uL — ABNORMAL HIGH (ref 0.9–3.3)

## 2013-10-04 LAB — TECHNOLOGIST REVIEW

## 2013-12-25 ENCOUNTER — Telehealth: Payer: Self-pay | Admitting: Hematology and Oncology

## 2013-12-25 NOTE — Telephone Encounter (Signed)
moved CP1 12/8 to NG 12/17. s/w wife and mailed schedule.

## 2014-01-02 ENCOUNTER — Ambulatory Visit: Payer: Medicare Other

## 2014-01-02 ENCOUNTER — Other Ambulatory Visit: Payer: Medicare Other

## 2014-01-11 ENCOUNTER — Telehealth: Payer: Self-pay | Admitting: Hematology and Oncology

## 2014-01-11 ENCOUNTER — Other Ambulatory Visit (HOSPITAL_BASED_OUTPATIENT_CLINIC_OR_DEPARTMENT_OTHER): Payer: Medicare Other

## 2014-01-11 ENCOUNTER — Ambulatory Visit (HOSPITAL_BASED_OUTPATIENT_CLINIC_OR_DEPARTMENT_OTHER): Payer: Medicare Other | Admitting: Hematology and Oncology

## 2014-01-11 ENCOUNTER — Encounter: Payer: Self-pay | Admitting: Hematology and Oncology

## 2014-01-11 VITALS — BP 132/55 | HR 66 | Temp 98.0°F | Resp 18 | Ht 68.5 in | Wt 185.7 lb

## 2014-01-11 DIAGNOSIS — C9110 Chronic lymphocytic leukemia of B-cell type not having achieved remission: Secondary | ICD-10-CM

## 2014-01-11 DIAGNOSIS — D630 Anemia in neoplastic disease: Secondary | ICD-10-CM

## 2014-01-11 LAB — COMPREHENSIVE METABOLIC PANEL (CC13)
ALT: 13 U/L (ref 0–55)
AST: 22 U/L (ref 5–34)
Albumin: 3.8 g/dL (ref 3.5–5.0)
Alkaline Phosphatase: 90 U/L (ref 40–150)
Anion Gap: 9 mEq/L (ref 3–11)
BUN: 16.4 mg/dL (ref 7.0–26.0)
CO2: 27 mEq/L (ref 22–29)
Calcium: 9.1 mg/dL (ref 8.4–10.4)
Chloride: 104 mEq/L (ref 98–109)
Creatinine: 1.1 mg/dL (ref 0.7–1.3)
EGFR: 70 mL/min/{1.73_m2} — AB (ref >90)
GLUCOSE: 127 mg/dL (ref 70–140)
Potassium: 4.4 mEq/L (ref 3.5–5.1)
Sodium: 140 mEq/L (ref 136–145)
Total Bilirubin: 0.37 mg/dL (ref 0.20–1.20)
Total Protein: 6.5 g/dL (ref 6.4–8.3)

## 2014-01-11 LAB — CBC WITH DIFFERENTIAL/PLATELET
BASO%: 0.1 % (ref 0.0–2.0)
Basophils Absolute: 0.1 10*3/uL (ref 0.0–0.1)
EOS ABS: 0.2 10*3/uL (ref 0.0–0.5)
EOS%: 0.2 % (ref 0.0–7.0)
HCT: 36.2 % — ABNORMAL LOW (ref 38.4–49.9)
HGB: 11.6 g/dL — ABNORMAL LOW (ref 13.0–17.1)
LYMPH%: 94.5 % — AB (ref 14.0–49.0)
MCH: 31.5 pg (ref 27.2–33.4)
MCHC: 31.9 g/dL — ABNORMAL LOW (ref 32.0–36.0)
MCV: 98.9 fL — AB (ref 79.3–98.0)
MONO#: 0.6 10*3/uL (ref 0.1–0.9)
MONO%: 0.7 % (ref 0.0–14.0)
NEUT%: 4.5 % — ABNORMAL LOW (ref 39.0–75.0)
NEUTROS ABS: 3.6 10*3/uL (ref 1.5–6.5)
PLATELETS: 160 10*3/uL (ref 140–400)
RBC: 3.67 10*6/uL — ABNORMAL LOW (ref 4.20–5.82)
RDW: 15 % — ABNORMAL HIGH (ref 11.0–14.6)
WBC: 78.7 10*3/uL (ref 4.0–10.3)
lymph#: 74.4 10*3/uL — ABNORMAL HIGH (ref 0.9–3.3)

## 2014-01-11 LAB — LACTATE DEHYDROGENASE (CC13): LDH: 241 U/L (ref 125–245)

## 2014-01-11 LAB — TECHNOLOGIST REVIEW

## 2014-01-11 NOTE — Assessment & Plan Note (Signed)
This is likely anemia of chronic disease. The patient denies recent history of bleeding such as epistaxis, hematuria or hematochezia. He is asymptomatic from the anemia. We will observe for now.  

## 2014-01-11 NOTE — Telephone Encounter (Signed)
Gave avs & cal for Sept.  °

## 2014-01-11 NOTE — Progress Notes (Signed)
[place] progress notes  Patient Care Team: Anda Kraft, MD as PCP - General (Endocrinology) Heath Lark, MD as Consulting Physician (Hematology and Oncology)  CHIEF COMPLAINTS/PURPOSE OF VISIT:  CLL with mild anemia  HISTORY OF PRESENTING ILLNESS:  Michael [name] 80 y.o. male was transferred to my care after his prior physician has left.  I reviewed the patient's records extensive and collaborated the history with the patient. Summary of his history is as follows: The patient was diagnosed with CLL when he was found to have lymphocytosis since the year of 2000. He was observed. He denies new lymphadenopathy or recent infection. Appetite is stable, denies recent weight loss. No fevers or chills.  MEDICAL HISTORY:  Past Medical History  Diagnosis Date  . Diabetes mellitus   . cll dx'd 2000    no rx  . Lymphocytosis   . CLL (chronic lymphoblastic leukemia)   . Arthritis   . Leukemia, chronic lymphoid 12/08/2010    SURGICAL HISTORY: Past Surgical History  Procedure Laterality Date  . Cataract extraction      SOCIAL HISTORY: History   Social History  . Marital Status: Married    Spouse Name: N/A    Number of Children: N/A  . Years of Education: N/A   Occupational History  . Not on file.   Social History Main Topics  . Smoking status: Never Smoker   . Smokeless tobacco: Current User    Types: Snuff  . Alcohol Use: No  . Drug Use: No  . Sexual Activity: Not on file   Other Topics Concern  . Not on file   Social History Narrative    FAMILY HISTORY: Family History  Problem Relation Age of Onset  . Cancer Mother   . Hypertension Father   . Diabetes Father     ALLERGIES:  has No Known Allergies.  MEDICATIONS:  Current Outpatient Prescriptions  Medication Sig Dispense Refill  . aspirin 325 MG tablet Take 325 mg by mouth daily.      Marland Kitchen atropine-PHENObarbital-scopolamine-hyoscyamine (DONNATAL) 16.2 MG tablet Take 1 tablet by  mouth every 8 (eight) hours as needed.      Marland Kitchen glipiZIDE (GLUCOTROL) 10 MG tablet Take 10 mg by mouth 2 (two) times daily before a meal.     . insulin NPH-insulin regular (NOVOLIN 70/30) (70-30) 100 UNIT/ML injection Inject 35 Units into the skin 2 (two) times daily with a meal. Sliding scale    . metFORMIN (GLUCOPHAGE) 1000 MG tablet Take 1,000 mg by mouth 2 (two) times daily with a meal.     . ONE TOUCH ULTRA TEST test strip     . ramipril (ALTACE) 5 MG tablet Take 5 mg by mouth daily.    . Tamsulosin HCl (FLOMAX) 0.4 MG CAPS Take 0.4 mg by mouth daily.    Marland Kitchen torsemide (DEMADEX) 20 MG tablet Take 20 mg by mouth daily.      . [DISCONTINUED] insulin aspart protamine-insulin aspart (NOVOLOG 70/30) (70-30) 100 UNIT/ML injection Inject 35 Units into the skin 2 (two) times daily with a meal.     . [DISCONTINUED] pantoprazole (PROTONIX) 40 MG tablet Take 40 mg by mouth daily.      . [DISCONTINUED] sitaGLIPtin (JANUVIA) 100 MG tablet Take 100 mg by mouth daily.       No current facility-administered medications for this visit.    REVIEW OF SYSTEMS:   Constitutional: Denies fevers, chills or abnormal night sweats Eyes: Denies blurriness of vision, double vision or watery  eyes Ears, nose, mouth, throat, and face: Denies mucositis or sore throat Respiratory: Denies cough, dyspnea or wheezes Cardiovascular: Denies palpitation, chest discomfort or lower extremity swelling Gastrointestinal:  Denies nausea, heartburn or change in bowel habits Skin: Denies abnormal skin rashes Lymphatics: Denies new lymphadenopathy or easy bruising Neurological:Denies numbness, tingling or new weaknesses Behavioral/Psych: Mood is stable, no new changes  All other systems were reviewed with the patient and are negative.  PHYSICAL EXAMINATION: ECOG PERFORMANCE STATUS: 0 - Asymptomatic  Filed Vitals:   01/11/14 1239  BP: 132/55  Pulse: 66  Temp: 98 F (36.7 C)  Resp: 18   Filed Weights   01/11/14 1239  Weight:  185 lb 11.2 oz (84.233 kg)    GENERAL:alert, no distress and comfortable SKIN: skin color, texture, turgor are normal, no rashes or significant lesions EYES: normal, conjunctiva are pink and non-injected, sclera clear OROPHARYNX:no exudate, normal lips, buccal mucosa, and tongue  NECK: supple, thyroid normal size, non-tender, without nodularity LYMPH:  no palpable lymphadenopathy in the cervical, axillary or inguinal LUNGS: clear to auscultation and percussion with normal breathing effort HEART: regular rate & rhythm and no murmurs without lower extremity edema ABDOMEN:abdomen soft, non-tender and normal bowel sounds Musculoskeletal:no cyanosis of digits and no clubbing  PSYCH: alert & oriented x 3 with fluent speech NEURO: no focal motor/sensory deficits  LABORATORY DATA:  I have reviewed the data as listed Lab Results  Component Value Date   WBC 78.7* 01/11/2014   HGB 11.6* 01/11/2014   HCT 36.2* 01/11/2014   MCV 98.9* 01/11/2014   PLT 160 01/11/2014    Recent Labs  07/03/13 1017 01/11/14 1227  NA 142 140  K 4.4 4.4  CO2 29 27  GLUCOSE 141* 127  BUN 13.9 16.4  CREATININE 1.0 1.1  CALCIUM 9.0 9.1  PROT 6.3* 6.5  ALBUMIN 3.6 3.8  AST 20 22  ALT 10 13  ALKPHOS 71 90  BILITOT 0.32 0.37   ASSESSMENT & PLAN:  Leukemia, chronic lymphoid His total white blood cell count is stable. I recommend 9 months visit with history, physical examination and blood work. He has no symptoms. Continue observation.  Anemia in neoplastic disease This is likely anemia of chronic disease. The patient denies recent history of bleeding such as epistaxis, hematuria or hematochezia. He is asymptomatic from the anemia. We will observe for now.    Orders Placed This Encounter  Procedures  . CBC with Differential    Standing Status: Future     Number of Occurrences:      Standing Expiration Date: 02/15/2015  . Comprehensive metabolic panel    Standing Status: Future     Number of  Occurrences:      Standing Expiration Date: 02/15/2015  . Lactate dehydrogenase    Standing Status: Future     Number of Occurrences:      Standing Expiration Date: 02/15/2015    All questions were answered. The patient knows to call the clinic with any problems, questions or concerns. I spent 15 minutes counseling the patient face to face. The total time spent in the appointment was 20 minutes and more than 50% was on counseling.     [facility], [place], MD 01/11/2014 2:59 PM

## 2014-01-11 NOTE — Assessment & Plan Note (Signed)
His total white blood cell count is stable. I recommend 9 months visit with history, physical examination and blood work. He has no symptoms. Continue observation.

## 2014-02-02 ENCOUNTER — Telehealth: Payer: Self-pay | Admitting: *Deleted

## 2014-02-02 NOTE — Telephone Encounter (Signed)
Opened in error

## 2014-07-11 ENCOUNTER — Other Ambulatory Visit: Payer: Self-pay | Admitting: Hematology and Oncology

## 2014-07-11 DIAGNOSIS — C9110 Chronic lymphocytic leukemia of B-cell type not having achieved remission: Secondary | ICD-10-CM

## 2014-10-11 ENCOUNTER — Other Ambulatory Visit (HOSPITAL_BASED_OUTPATIENT_CLINIC_OR_DEPARTMENT_OTHER): Payer: Medicare Other

## 2014-10-11 ENCOUNTER — Other Ambulatory Visit (HOSPITAL_COMMUNITY)
Admission: RE | Admit: 2014-10-11 | Discharge: 2014-10-11 | Disposition: A | Discharge status: Home / Self Care | Payer: Medicare Other | Source: Ambulatory Visit | Attending: Hematology and Oncology | Admitting: Hematology and Oncology

## 2014-10-11 ENCOUNTER — Encounter: Payer: Self-pay | Admitting: Hematology and Oncology

## 2014-10-11 ENCOUNTER — Ambulatory Visit (HOSPITAL_BASED_OUTPATIENT_CLINIC_OR_DEPARTMENT_OTHER): Payer: Medicare Other | Admitting: Hematology and Oncology

## 2014-10-11 ENCOUNTER — Telehealth: Payer: Self-pay | Admitting: Hematology and Oncology

## 2014-10-11 VITALS — BP 109/49 | HR 60 | Temp 98.5°F | Resp 18 | Wt 183.8 lb

## 2014-10-11 DIAGNOSIS — D630 Anemia in neoplastic disease: Secondary | ICD-10-CM

## 2014-10-11 DIAGNOSIS — D6959 Other secondary thrombocytopenia: Secondary | ICD-10-CM | POA: Diagnosis not present

## 2014-10-11 DIAGNOSIS — D696 Thrombocytopenia, unspecified: Secondary | ICD-10-CM | POA: Diagnosis not present

## 2014-10-11 DIAGNOSIS — C9110 Chronic lymphocytic leukemia of B-cell type not having achieved remission: Secondary | ICD-10-CM | POA: Diagnosis not present

## 2014-10-11 LAB — COMPREHENSIVE METABOLIC PANEL (CC13)
ALT: 10 U/L (ref 0–55)
AST: 21 U/L (ref 5–34)
Albumin: 3.7 g/dL (ref 3.5–5.0)
Alkaline Phosphatase: 75 U/L (ref 40–150)
Anion Gap: 4 mEq/L (ref 3–11)
BUN: 17.8 mg/dL (ref 7.0–26.0)
CHLORIDE: 109 meq/L (ref 98–109)
CO2: 29 mEq/L (ref 22–29)
Calcium: 9.4 mg/dL (ref 8.4–10.4)
Creatinine: 1 mg/dL (ref 0.7–1.3)
EGFR: 79 mL/min/{1.73_m2} — ABNORMAL LOW (ref >90)
Glucose: 101 mg/dl (ref 70–140)
POTASSIUM: 4.8 meq/L (ref 3.5–5.1)
Sodium: 142 mEq/L (ref 136–145)
Total Bilirubin: 0.38 mg/dL (ref 0.20–1.20)
Total Protein: 6.3 g/dL — ABNORMAL LOW (ref 6.4–8.3)

## 2014-10-11 LAB — CBC WITH DIFFERENTIAL/PLATELET
BASO%: 0.2 % (ref 0.0–2.0)
Basophils Absolute: 0.2 10*3/uL — ABNORMAL HIGH (ref 0.0–0.1)
EOS%: 0.2 % (ref 0.0–7.0)
Eosinophils Absolute: 0.2 10*3/uL (ref 0.0–0.5)
HCT: 33.5 % — ABNORMAL LOW (ref 38.4–49.9)
HGB: 10.9 g/dL — ABNORMAL LOW (ref 13.0–17.1)
LYMPH%: 94.2 % — ABNORMAL HIGH (ref 14.0–49.0)
MCH: 32.4 pg (ref 27.2–33.4)
MCHC: 32.4 g/dL (ref 32.0–36.0)
MCV: 99.9 fL — ABNORMAL HIGH (ref 79.3–98.0)
MONO#: 0.8 10*3/uL (ref 0.1–0.9)
MONO%: 1 % (ref 0.0–14.0)
NEUT#: 3.4 10*3/uL (ref 1.5–6.5)
NEUT%: 4.4 % — ABNORMAL LOW (ref 39.0–75.0)
Platelets: 131 10*3/uL — ABNORMAL LOW (ref 140–400)
RBC: 3.36 10*6/uL — ABNORMAL LOW (ref 4.20–5.82)
RDW: 15.1 % — ABNORMAL HIGH (ref 11.0–14.6)
WBC: 78 10*3/uL (ref 4.0–10.3)
lymph#: 73.5 10*3/uL — ABNORMAL HIGH (ref 0.9–3.3)

## 2014-10-11 LAB — TECHNOLOGIST REVIEW

## 2014-10-11 LAB — LACTATE DEHYDROGENASE (CC13): LDH: 244 U/L (ref 125–245)

## 2014-10-11 NOTE — Assessment & Plan Note (Signed)
This is likely anemia of chronic disease. The patient denies recent history of bleeding such as epistaxis, hematuria or hematochezia. He is asymptomatic from the anemia. We will observe for now.  

## 2014-10-11 NOTE — Progress Notes (Signed)
[name] PROGRESS NOTE  Patient Care Team: Anda Kraft, MD as PCP - General (Endocrinology) Heath Lark, MD as Consulting Physician (Hematology and Oncology)  SUMMARY OF ONCOLOGIC HISTORY:  I reviewed the patient's records extensive and collaborated the history with the patient. Summary of his history is as follows: The patient was diagnosed with CLL when he was found to have lymphocytosis since the year of 2000. He was observed.   INTERVAL HISTORY: Please see below for problem oriented charting. He feels well. He denies new lymphadenopathy or recent infection. Appetite is stable, denies recent weight loss. No fevers or chills  REVIEW OF SYSTEMS:   Constitutional: Denies fevers, chills or abnormal weight loss Eyes: Denies blurriness of vision Ears, nose, mouth, throat, and face: Denies mucositis or sore throat Respiratory: Denies cough, dyspnea or wheezes Cardiovascular: Denies palpitation, chest discomfort or lower extremity swelling Gastrointestinal:  Denies nausea, heartburn or change in bowel habits Skin: Denies abnormal skin rashes Lymphatics: Denies new lymphadenopathy or easy bruising Neurological:Denies numbness, tingling or new weaknesses Behavioral/Psych: Mood is stable, no new changes  All other systems were reviewed with the patient and are negative.  I have reviewed the past medical history, past surgical history, social history and family history with the patient and they are unchanged from previous note.  ALLERGIES:  has No Known Allergies.  MEDICATIONS:  Current Outpatient Prescriptions  Medication Sig Dispense Refill  . aspirin 325 MG tablet Take 325 mg by mouth daily.      Marland Kitchen atropine-PHENObarbital-scopolamine-hyoscyamine (DONNATAL) 16.2 MG tablet Take 1 tablet by mouth every 8 (eight) hours as needed.      Marland Kitchen glipiZIDE (GLUCOTROL) 10 MG tablet Take 10 mg by mouth 2 (two) times daily before a meal.     . insulin NPH-insulin regular  (NOVOLIN 70/30) (70-30) 100 UNIT/ML injection Inject 35 Units into the skin 2 (two) times daily with a meal. Sliding scale    . metFORMIN (GLUCOPHAGE) 1000 MG tablet Take 1,000 mg by mouth 2 (two) times daily with a meal.     . ONE TOUCH ULTRA TEST test strip     . ramipril (ALTACE) 5 MG tablet Take 5 mg by mouth daily.    . Tamsulosin HCl (FLOMAX) 0.4 MG CAPS Take 0.4 mg by mouth daily.    Marland Kitchen torsemide (DEMADEX) 20 MG tablet Take 20 mg by mouth daily.      . [DISCONTINUED] insulin aspart protamine-insulin aspart (NOVOLOG 70/30) (70-30) 100 UNIT/ML injection Inject 35 Units into the skin 2 (two) times daily with a meal.     . [DISCONTINUED] pantoprazole (PROTONIX) 40 MG tablet Take 40 mg by mouth daily.      . [DISCONTINUED] sitaGLIPtin (JANUVIA) 100 MG tablet Take 100 mg by mouth daily.       No current facility-administered medications for this visit.    PHYSICAL EXAMINATION: ECOG PERFORMANCE STATUS: 0 - Asymptomatic  Filed Vitals:   10/11/14 1144  BP: 109/49  Pulse: 60  Temp: 98.5 F (36.9 C)  Resp: 18   Filed Weights   10/11/14 1144  Weight: 183 lb 12.8 oz (83.371 kg)    GENERAL:alert, no distress and comfortable SKIN: skin color, texture, turgor are normal, no rashes or significant lesions EYES: normal, Conjunctiva are pink and non-injected, sclera clear OROPHARYNX:no exudate, no erythema and lips, buccal mucosa, and tongue normal  NECK: supple, thyroid normal size, non-tender, without nodularity LYMPH:  no palpable lymphadenopathy in the cervical, axillary or inguinal LUNGS: clear to auscultation  and percussion with normal breathing effort HEART: regular rate & rhythm and no murmurs and no lower extremity edema ABDOMEN:abdomen soft, non-tender and normal bowel sounds Musculoskeletal:no cyanosis of digits and no clubbing  NEURO: alert & oriented x 3 with fluent speech, no focal motor/sensory deficits  LABORATORY DATA:  I have reviewed the data as listed    Component  Value Date/Time   NA 142 10/11/2014 1116   NA 140 08/06/2011 1411   K 4.8 10/11/2014 1116   K 4.1 08/06/2011 1411   CL 104 03/28/2012 1426   CL 103 08/06/2011 1411   CO2 29 10/11/2014 1116   CO2 28 08/06/2011 1411   GLUCOSE 101 10/11/2014 1116   GLUCOSE 171* 03/28/2012 1426   GLUCOSE 154* 08/06/2011 1411   BUN 17.8 10/11/2014 1116   BUN 13 08/06/2011 1411   CREATININE 1.0 10/11/2014 1116   CREATININE 0.95 08/06/2011 1411   CALCIUM 9.4 10/11/2014 1116   CALCIUM 9.0 08/06/2011 1411   PROT 6.3* 10/11/2014 1116   PROT 6.3 08/06/2011 1411   ALBUMIN 3.7 10/11/2014 1116   ALBUMIN 4.0 08/06/2011 1411   AST 21 10/11/2014 1116   AST 18 08/06/2011 1411   ALT 10 10/11/2014 1116   ALT 11 08/06/2011 1411   ALKPHOS 75 10/11/2014 1116   ALKPHOS 83 08/06/2011 1411   BILITOT 0.38 10/11/2014 1116   BILITOT 0.3 08/06/2011 1411   GFRNONAA 55* 02/16/2007 0805   GFRAA  02/16/2007 0805    >60        The eGFR has been calculated using the MDRD equation. This calculation has not been validated in all clinical    No results found for: SPEP, UPEP  Lab Results  Component Value Date   WBC 78.0* 10/11/2014   NEUTROABS 3.4 10/11/2014   HGB 10.9* 10/11/2014   HCT 33.5* 10/11/2014   MCV 99.9* 10/11/2014   PLT 131* 10/11/2014      Chemistry      Component Value Date/Time   NA 142 10/11/2014 1116   NA 140 08/06/2011 1411   K 4.8 10/11/2014 1116   K 4.1 08/06/2011 1411   CL 104 03/28/2012 1426   CL 103 08/06/2011 1411   CO2 29 10/11/2014 1116   CO2 28 08/06/2011 1411   BUN 17.8 10/11/2014 1116   BUN 13 08/06/2011 1411   CREATININE 1.0 10/11/2014 1116   CREATININE 0.95 08/06/2011 1411      Component Value Date/Time   CALCIUM 9.4 10/11/2014 1116   CALCIUM 9.0 08/06/2011 1411   ALKPHOS 75 10/11/2014 1116   ALKPHOS 83 08/06/2011 1411   AST 21 10/11/2014 1116   AST 18 08/06/2011 1411   ALT 10 10/11/2014 1116   ALT 11 08/06/2011 1411   BILITOT 0.38 10/11/2014 1116   BILITOT 0.3  08/06/2011 1411      ASSESSMENT & PLAN:  Leukemia, chronic lymphoid His total white blood cell count is stable. I recommend 12 months visit with history, physical examination and blood work. He has no symptoms. Continue observation.    Anemia in neoplastic disease This is likely anemia of chronic disease. The patient denies recent history of bleeding such as epistaxis, hematuria or hematochezia. He is asymptomatic from the anemia. We will observe for now.     Thrombocytopenia This is likely related to the CLL. He is not symptomatic. Continue observation only.   Orders Placed This Encounter  Procedures  . CBC with Differential/Platelet    Standing Status: Future     Number  of Occurrences:      Standing Expiration Date: 11/15/2015  . Comprehensive metabolic panel    Standing Status: Future     Number of Occurrences:      Standing Expiration Date: 11/15/2015  . Lactate dehydrogenase    Standing Status: Future     Number of Occurrences:      Standing Expiration Date: 11/15/2015   All questions were answered. The patient knows to call the clinic with any problems, questions or concerns. No barriers to learning was detected. I spent 15 minutes counseling the patient face to face. The total time spent in the appointment was 20 minutes and more than 50% was on counseling and review of test results     [facility], [place], MD 10/11/2014 3:16 PM

## 2014-10-11 NOTE — Telephone Encounter (Signed)
per pof to sch pt appt-gave pt copy of avs °

## 2014-10-11 NOTE — Assessment & Plan Note (Signed)
His total white blood cell count is stable. I recommend 12 months visit with history, physical examination and blood work. He has no symptoms. Continue observation.

## 2014-10-11 NOTE — Assessment & Plan Note (Signed)
This is likely related to the CLL. He is not symptomatic. Continue observation only.

## 2014-10-18 ENCOUNTER — Encounter: Payer: Self-pay | Admitting: Hematology and Oncology

## 2014-10-18 LAB — TISSUE HYBRIDIZATION TO NCBH

## 2014-10-18 LAB — FISH, PERIPHERAL BLOOD

## 2014-10-23 ENCOUNTER — Other Ambulatory Visit: Payer: Self-pay | Admitting: Hematology and Oncology

## 2014-10-23 ENCOUNTER — Ambulatory Visit: Payer: Medicare Other | Admitting: Hematology and Oncology

## 2014-10-23 ENCOUNTER — Telehealth: Payer: Self-pay | Admitting: Hematology and Oncology

## 2014-10-23 ENCOUNTER — Other Ambulatory Visit (HOSPITAL_BASED_OUTPATIENT_CLINIC_OR_DEPARTMENT_OTHER): Payer: Medicare Other

## 2014-10-23 DIAGNOSIS — D630 Anemia in neoplastic disease: Secondary | ICD-10-CM

## 2014-10-23 DIAGNOSIS — C9110 Chronic lymphocytic leukemia of B-cell type not having achieved remission: Secondary | ICD-10-CM

## 2014-10-23 DIAGNOSIS — D696 Thrombocytopenia, unspecified: Secondary | ICD-10-CM | POA: Diagnosis not present

## 2014-10-23 LAB — CBC WITH DIFFERENTIAL/PLATELET
BASO%: 0.1 % (ref 0.0–2.0)
Basophils Absolute: 0.1 10*3/uL (ref 0.0–0.1)
EOS%: 0.2 % (ref 0.0–7.0)
Eosinophils Absolute: 0.2 10*3/uL (ref 0.0–0.5)
HCT: 33.6 % — ABNORMAL LOW (ref 38.4–49.9)
HGB: 10.9 g/dL — ABNORMAL LOW (ref 13.0–17.1)
LYMPH%: 94.4 % — AB (ref 14.0–49.0)
MCH: 32.3 pg (ref 27.2–33.4)
MCHC: 32.5 g/dL (ref 32.0–36.0)
MCV: 99.6 fL — AB (ref 79.3–98.0)
MONO#: 0.6 10*3/uL (ref 0.1–0.9)
MONO%: 0.7 % (ref 0.0–14.0)
NEUT#: 3.8 10*3/uL (ref 1.5–6.5)
NEUT%: 4.6 % — AB (ref 39.0–75.0)
PLATELETS: 159 10*3/uL (ref 140–400)
RBC: 3.38 10*6/uL — AB (ref 4.20–5.82)
RDW: 15 % — ABNORMAL HIGH (ref 11.0–14.6)
WBC: 83 10*3/uL (ref 4.0–10.3)
lymph#: 78.3 10*3/uL — ABNORMAL HIGH (ref 0.9–3.3)

## 2014-10-23 LAB — COMPREHENSIVE METABOLIC PANEL (CC13)
ALT: 11 U/L (ref 0–55)
ANION GAP: 8 meq/L (ref 3–11)
AST: 21 U/L (ref 5–34)
Albumin: 3.8 g/dL (ref 3.5–5.0)
Alkaline Phosphatase: 87 U/L (ref 40–150)
BUN: 15.2 mg/dL (ref 7.0–26.0)
CHLORIDE: 104 meq/L (ref 98–109)
CO2: 28 meq/L (ref 22–29)
Calcium: 9.1 mg/dL (ref 8.4–10.4)
Creatinine: 1.1 mg/dL (ref 0.7–1.3)
EGFR: 73 mL/min/{1.73_m2} — AB (ref >90)
Glucose: 119 mg/dl (ref 70–140)
POTASSIUM: 4.6 meq/L (ref 3.5–5.1)
Sodium: 140 mEq/L (ref 136–145)
Total Bilirubin: 0.33 mg/dL (ref 0.20–1.20)
Total Protein: 6.4 g/dL (ref 6.4–8.3)

## 2014-10-23 LAB — LACTATE DEHYDROGENASE (CC13): LDH: 258 U/L — ABNORMAL HIGH (ref 125–245)

## 2014-10-23 LAB — TECHNOLOGIST REVIEW

## 2014-10-23 NOTE — Telephone Encounter (Signed)
s.w. pt wife and advised on March 2017 appt....ok and aware

## 2015-04-10 ENCOUNTER — Telehealth: Payer: Self-pay | Admitting: Hematology and Oncology

## 2015-04-10 NOTE — Telephone Encounter (Signed)
returned call adn s.w pt and r/s appt pt ok adn aware of new d.t

## 2015-04-11 ENCOUNTER — Ambulatory Visit: Payer: Medicare Other | Admitting: Hematology and Oncology

## 2015-04-11 ENCOUNTER — Other Ambulatory Visit: Payer: Medicare Other

## 2015-04-18 ENCOUNTER — Other Ambulatory Visit: Payer: Self-pay | Admitting: Hematology and Oncology

## 2015-04-18 DIAGNOSIS — C9110 Chronic lymphocytic leukemia of B-cell type not having achieved remission: Secondary | ICD-10-CM

## 2015-04-22 ENCOUNTER — Other Ambulatory Visit (HOSPITAL_BASED_OUTPATIENT_CLINIC_OR_DEPARTMENT_OTHER): Payer: Medicare Other

## 2015-04-22 ENCOUNTER — Encounter: Payer: Self-pay | Admitting: Hematology and Oncology

## 2015-04-22 ENCOUNTER — Encounter: Payer: Self-pay | Admitting: *Deleted

## 2015-04-22 ENCOUNTER — Telehealth: Payer: Self-pay | Admitting: Hematology and Oncology

## 2015-04-22 ENCOUNTER — Ambulatory Visit (HOSPITAL_BASED_OUTPATIENT_CLINIC_OR_DEPARTMENT_OTHER): Payer: Medicare Other | Admitting: Hematology and Oncology

## 2015-04-22 VITALS — BP 117/62 | HR 69 | Temp 98.4°F | Resp 18 | Ht 68.5 in | Wt 173.8 lb

## 2015-04-22 DIAGNOSIS — R634 Abnormal weight loss: Secondary | ICD-10-CM

## 2015-04-22 DIAGNOSIS — D61818 Other pancytopenia: Secondary | ICD-10-CM | POA: Insufficient documentation

## 2015-04-22 DIAGNOSIS — C9110 Chronic lymphocytic leukemia of B-cell type not having achieved remission: Secondary | ICD-10-CM

## 2015-04-22 DIAGNOSIS — R5383 Other fatigue: Secondary | ICD-10-CM | POA: Diagnosis not present

## 2015-04-22 LAB — COMPREHENSIVE METABOLIC PANEL
ALBUMIN: 3.6 g/dL (ref 3.5–5.0)
ALK PHOS: 59 U/L (ref 40–150)
ALT: 11 U/L (ref 0–55)
ANION GAP: 6 meq/L (ref 3–11)
AST: 23 U/L (ref 5–34)
BILIRUBIN TOTAL: 0.38 mg/dL (ref 0.20–1.20)
BUN: 18.4 mg/dL (ref 7.0–26.0)
CO2: 30 mEq/L — ABNORMAL HIGH (ref 22–29)
Calcium: 9.1 mg/dL (ref 8.4–10.4)
Chloride: 105 mEq/L (ref 98–109)
Creatinine: 1.1 mg/dL (ref 0.7–1.3)
EGFR: 71 mL/min/{1.73_m2} — AB (ref >90)
Glucose: 103 mg/dl (ref 70–140)
Potassium: 4.5 mEq/L (ref 3.5–5.1)
Sodium: 141 mEq/L (ref 136–145)
TOTAL PROTEIN: 6.1 g/dL — AB (ref 6.4–8.3)

## 2015-04-22 LAB — TECHNOLOGIST REVIEW

## 2015-04-22 LAB — CBC WITH DIFFERENTIAL/PLATELET
BASO%: 0.2 % (ref 0.0–2.0)
Basophils Absolute: 0.2 10*3/uL — ABNORMAL HIGH (ref 0.0–0.1)
EOS%: 0.1 % (ref 0.0–7.0)
Eosinophils Absolute: 0.1 10*3/uL (ref 0.0–0.5)
HCT: 30.8 % — ABNORMAL LOW (ref 38.4–49.9)
HGB: 9.8 g/dL — ABNORMAL LOW (ref 13.0–17.1)
LYMPH%: 95.4 % — ABNORMAL HIGH (ref 14.0–49.0)
MCH: 32.1 pg (ref 27.2–33.4)
MCHC: 32 g/dL (ref 32.0–36.0)
MCV: 100.5 fL — ABNORMAL HIGH (ref 79.3–98.0)
MONO#: 1 10*3/uL — ABNORMAL HIGH (ref 0.1–0.9)
MONO%: 1.3 % (ref 0.0–14.0)
NEUT#: 2.3 10*3/uL (ref 1.5–6.5)
NEUT%: 3 % — ABNORMAL LOW (ref 39.0–75.0)
Platelets: 94 10*3/uL — ABNORMAL LOW (ref 140–400)
RBC: 3.06 10*6/uL — ABNORMAL LOW (ref 4.20–5.82)
RDW: 16.8 % — ABNORMAL HIGH (ref 11.0–14.6)
WBC: 75.4 10*3/uL (ref 4.0–10.3)
lymph#: 71.8 10*3/uL — ABNORMAL HIGH (ref 0.9–3.3)

## 2015-04-22 LAB — LACTATE DEHYDROGENASE: LDH: 216 U/L (ref 125–245)

## 2015-04-22 MED ORDER — IBRUTINIB 140 MG PO CAPS
420.0000 mg | ORAL_CAPSULE | Freq: Every day | ORAL | Status: DC
Start: 2015-04-22 — End: 2015-10-14

## 2015-04-22 NOTE — Progress Notes (Signed)
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 He returns for further follow-up. He has progressive fatigue and weight loss of 10 pounds since I saw him. Denies recent infection. 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 on Tuesdays, Thursdays and Saturdays  REVIEW OF SYSTEMS:   Constitutional: Denies fevers, chills or abnormal weight loss Eyes: Denies blurriness of vision Ears, nose, mouth, throat, and face: Denies mucositis or sore throat Respiratory: Denies cough, dyspnea or wheezes Cardiovascular: Denies palpitation, chest discomfort or lower extremity swelling Gastrointestinal:  Denies nausea, heartburn or change in bowel habits Skin: Denies abnormal skin rashes Lymphatics: Denies new lymphadenopathy or easy bruising Neurological:Denies numbness, tingling or new weaknesses Behavioral/Psych: Mood is stable, no new changes  All other systems were reviewed with the patient and are negative.  I have reviewed the past medical history, past surgical history, social history and family history with the patient and they are unchanged from previous note.  ALLERGIES:  has No Known Allergies.  MEDICATIONS:  Current Outpatient Prescriptions  Medication Sig Dispense Refill  . aspirin 325 MG tablet Take 325 mg by mouth daily.      Marland Kitchen atropine-PHENObarbital-scopolamine-hyoscyamine (DONNATAL) 16.2 MG tablet Take 1 tablet by mouth every 8 (eight) hours as needed.      Marland Kitchen glipiZIDE (GLUCOTROL) 10 MG tablet  Take 10 mg by mouth 2 (two) times daily before a meal.     . insulin NPH-insulin regular (NOVOLIN 70/30) (70-30) 100 UNIT/ML injection Inject 35 Units into the skin 2 (two) times daily with a meal. Sliding scale    . metFORMIN (GLUCOPHAGE) 1000 MG tablet Take 1,000 mg by mouth 2 (two) times daily with a meal.     . ONE TOUCH ULTRA TEST test strip     . ramipril (ALTACE) 5 MG tablet Take 5 mg by mouth daily.    . Tamsulosin HCl (FLOMAX) 0.4 MG CAPS Take 0.4 mg by mouth daily.    Marland Kitchen torsemide (DEMADEX) 20 MG tablet Take 20 mg by mouth daily.      Marland Kitchen ibrutinib (IMBRUVICA) 140 MG capsul Take 3 capsules (420 mg total) by mouth daily. 90 capsule 6  . [DISCONTINUED] insulin aspart protamine-insulin aspart (NOVOLOG 70/30) (70-30) 100 UNIT/ML injection Inject 35 Units into the skin 2 (two) times daily with a meal.     . [DISCONTINUED] pantoprazole (PROTONIX) 40 MG tablet Take 40 mg by mouth daily.      . [DISCONTINUED] sitaGLIPtin (JANUVIA) 100 MG tablet Take 100 mg by mouth daily.       No current facility-administered medications for this visit.    PHYSICAL EXAMINATION: ECOG PERFORMANCE STATUS: 1 - Symptomatic but completely ambulatory  Filed Vitals:   04/22/15 1142  BP: 117/62  Pulse: 69  Temp: 98.4 F (36.9 C)  Resp: 18   Filed Weights   04/22/15 1142  Weight: 173 lb 12.8 oz (78.835 kg)    GENERAL:alert, no distress and comfortable SKIN: skin color, texture, turgor are normal, no rashes or significant lesions EYES: normal, Conjunctiva are pink and non-injected, sclera clear OROPHARYNX:no  exudate, no erythema and lips, buccal mucosa, and tongue normal  NECK: supple, thyroid normal size, non-tender, without nodularity LYMPH:  He has palpable lymphadenopathy at the base of his neck and axilla LUNGS: clear to auscultation and percussion with normal breathing effort HEART: regular rate & rhythm and no murmurs and no lower extremity edema ABDOMEN:abdomen soft, non-tender and normal bowel  sounds Musculoskeletal:no cyanosis of digits and no clubbing  NEURO: alert & oriented x 3 with fluent speech, no focal motor/sensory deficits  LABORATORY DATA:  I have reviewed the data as listed    Component Value Date/Time   NA 141 04/22/2015 1133   NA 140 08/06/2011 1411   K 4.5 04/22/2015 1133   K 4.1 08/06/2011 1411   CL 104 03/28/2012 1426   CL 103 08/06/2011 1411   CO2 30* 04/22/2015 1133   CO2 28 08/06/2011 1411   GLUCOSE 103 04/22/2015 1133   GLUCOSE 171* 03/28/2012 1426   GLUCOSE 154* 08/06/2011 1411   BUN 18.4 04/22/2015 1133   BUN 13 08/06/2011 1411   CREATININE 1.1 04/22/2015 1133   CREATININE 0.95 08/06/2011 1411   CALCIUM 9.1 04/22/2015 1133   CALCIUM 9.0 08/06/2011 1411   PROT 6.1* 04/22/2015 1133   PROT 6.3 08/06/2011 1411   ALBUMIN 3.6 04/22/2015 1133   ALBUMIN 4.0 08/06/2011 1411   AST 23 04/22/2015 1133   AST 18 08/06/2011 1411   ALT 11 04/22/2015 1133   ALT 11 08/06/2011 1411   ALKPHOS 59 04/22/2015 1133   ALKPHOS 83 08/06/2011 1411   BILITOT 0.38 04/22/2015 1133   BILITOT 0.3 08/06/2011 1411   GFRNONAA 55* 02/16/2007 0805   GFRAA  02/16/2007 0805    >60        The eGFR has been calculated using the MDRD equation. This calculation has not been validated in all clinical    No results found for: SPEP, UPEP  Lab Results  Component Value Date   WBC 75.4* 04/22/2015   NEUTROABS 2.3 04/22/2015   HGB 9.8* 04/22/2015   HCT 30.8* 04/22/2015   MCV 100.5* 04/22/2015   PLT 94* 04/22/2015      Chemistry      Component Value Date/Time   NA 141 04/22/2015 1133   NA 140 08/06/2011 1411   K 4.5 04/22/2015 1133   K 4.1 08/06/2011 1411   CL 104 03/28/2012 1426   CL 103 08/06/2011 1411   CO2 30* 04/22/2015 1133   CO2 28 08/06/2011 1411   BUN 18.4 04/22/2015 1133   BUN 13 08/06/2011 1411   CREATININE 1.1 04/22/2015 1133   CREATININE 0.95 08/06/2011 1411      Component Value Date/Time   CALCIUM 9.1 04/22/2015 1133   CALCIUM 9.0 08/06/2011  1411   ALKPHOS 59 04/22/2015 1133   ALKPHOS 83 08/06/2011 1411   AST 23 04/22/2015 1133   AST 18 08/06/2011 1411   ALT 11 04/22/2015 1133   ALT 11 08/06/2011 1411   BILITOT 0.38 04/22/2015 1133   BILITOT 0.3 08/06/2011 1411      ASSESSMENT & PLAN:  CLL (chronic lymphocytic leukemia) The patient had progressive fatigue, weight loss and pancytopenia with worsening anemia and thrombocytopenia. He has clinical palpable lymphadenopathy. I recommend treatment due to symptomatic disease. I will order a CT scan of the chest, abdomen and pelvis for evaluation. As the patient is a diabetic, I recommend holding off metformin, ACE inhibitor and diuretic therapy the day before, the day of and the day after CT scan and  I will result with him next month. The decision was made based on publication at the [facility]. It is a category 1 recommendation from [employer]CC[employer] for first line treatment for elderly patients.  Ibrutinib versus Ofatumumab in Previously Treated Chronic Lymphoid Leukemia Gasper Lloyd, M.D., Edmonia Lynch. Owens Shark, M.D., Ph.D., Douglass Rivers, M.D., Link Snuffer, M.D., Jeanne Ivan, M.D., Serita Sheller, M.B., B.S., Lauris Chroman, M.D., Hilary Hertz. Marti Sleigh, M.D., Macarthur Critchley, M.B., B.S., Ph.D., Chrisandra Carota, M.D., Milly Jakob, M.D., Carey Bullocks. Aris Lot, M.D., Jeanine Luz. Valera Castle, M.D., Mahala Menghini, M.D., Yvonne Kendall, M.D., Harland German, M.D., Ph.D., Laury Deep, M.D., Evelena Leyden, M.D., Assunta Gambles, M.D., Esau Grew, M.D., Wyatt Mage, M.D., Joyce Gross, M.D., Olena Mater, M.D., Minda Ditto, M.D., William Dalton, M.D., Stark Klein, Ph.D., F.R.C.Path., Carmelina Peal, M.D., Neal Dy, M.D., Gentry Fitz, M.D., Vida Rigger. Sherrian Divers, M.D., Lenoard Aden, Ph.D., Earnestine Mealing, M.D., Oretha Caprice, D.Rippey., Lowry Ram, M.D., and Raford Pitcher, M.B., Ch.B., Ph.D., for the [employer]* [facility] [id number] 17, 2014DOI:  [id number]  The chemotherapy consists of Ibrutinib, a Bruton's tyrosine kinase inhibitor. We discussed the role of chemotherapy. The intent is for palliative.   Ibrutinib is taken as a single agent oral treatment indefinitely until unexpected side-effects or lack of response.  In this multicenter, open-label, phase 3 study, we randomly assigned 391 patients with relapsed or refractory CLL or SLL to receive daily ibrutinib or the anti-CD20 antibody ofatumumab. The primary end point was the duration of progression-free survival, with the duration of overall survival and the overall response rate as secondary end points.  At a median follow-up of 9.4 months, ibrutinib significantly improved progression-free survival; the median duration was not reached in the ibrutinib group (with a rate of progression-free survival of 88% at 6 months), as compared with a median of 8.1 months in the ofatumumab group (hazard ratio for progression or death in the ibrutinib group, 0.22; P<0.001). Ibrutinib also significantly improved overall survival (hazard ratio for death, 0.43; P=0.005). At 12 months, the overall survival rate was 90% in the ibrutinib group and 81% in the ofatumumab group. The overall response rate was significantly higher in the ibrutinib group than in the ofatumumab group (42.6% vs. 4.1%, P<0.001). An additional 20% of ibrutinib-treated patients had a partial response with lymphocytosis. Similar effects were observed regardless of whether patients had a chromosome 17p13.1 deletion or resistance to purine analogues.   The most frequent nonhematologic adverse events were diarrhea, fatigue, pyrexia, and nausea in the ibrutinib group and fatigue, infusion-related reactions, and cough in the ofatumumab group.  Some of the short term side-effects included, though not limited to, risk of fatigue, weight loss, tumor lysis syndrome, risk of allergic reactions, pancytopenia, bruising, diarrhea,  transient leukocytosis, life-threatening infections, need for transfusions of blood products, nausea, vomiting, change in bowel habits, admission to hospital for various reasons, and risks of death.   The patient is aware that the response rates discussed earlier is not guaranteed.    After a long discussion, patient made an informed decision to proceed with the prescribed plan of care.   I will get my pharmacist involved in insurance prior authorization. I plan to recheck CBC on a weekly basis and see him back a month for toxicity review.  Pancytopenia, acquired [facility]) This is likely due to her disease. The patient denies recent history of bleeding such as epistaxis, hematuria or hematochezia. He is asymptomatic from the anemia & low platelets. We  will observe for now.    Orders Placed This Encounter  Procedures  . CT Chest W Contrast    Standing Status: Future     Number of Occurrences:      Standing Expiration Date: 06/21/2016    Order Specific Question:  Reason for Exam (SYMPTOM  OR DIAGNOSIS REQUIRED)    Answer:  staging CLL/lymphoma    Order Specific Question:  Preferred imaging location?    Answer:  [facility]  . CT Abdomen Pelvis W Contrast    Standing Status: Future     Number of Occurrences:      Standing Expiration Date: 07/22/2016    Order Specific Question:  Reason for Exam (SYMPTOM  OR DIAGNOSIS REQUIRED)    Answer:  staging CLL/lymphoma    Order Specific Question:  Preferred imaging location?    Answer:  [facility]  . CBC with Differential/Platelet    Standing Status: Standing     Number of Occurrences: 9     Standing Expiration Date: 04/21/2016  . Comprehensive metabolic panel    Standing Status: Standing     Number of Occurrences: 9     Standing Expiration Date: 04/21/2016   All questions were answered. The patient knows to call the clinic with any problems, questions or concerns. No barriers to learning was detected. I spent 30 minutes  counseling the patient face to face. The total time spent in the appointment was 40 minutes and more than 50% was on counseling and review of test results     [facility], [place], MD 04/22/2015 12:38 PM

## 2015-04-22 NOTE — Patient Instructions (Signed)
Ibrutinib capsules What is this medicine? IBRUTINIB (eye BROO ti nib) is a medicine that targets proteins in cancer cells and stops the cancer cells from growing. It is used to treat mantle cell lymphoma, chronic lymphocytic leukemia, small lymphocytic lymphoma, and Waldenstrom macroglobulinemia. This medicine may be used for other purposes; ask your health care provider or pharmacist if you have questions. What should I tell my health care provider before I take this medicine? They need to know if you have any of these conditions: -bleeding disorders -diabetes -heart disease -high blood pressure -high cholesterol -history of irregular heartbeat -infection -liver disease -recent surgery -smoke tobacco -take medicines that treat or prevent blood clots -an unusual or allergic reaction to ibrutinib, other medicines, foods, dyes, or preservatives -pregnant or trying to get pregnant -breast-feeding How should I use this medicine? Take this medicine by mouth with a glass of water. Follow the directions on the prescription label. Do not cut, crush or chew this medicine. Do not take with grapefruit juice or eat [place] oranges. Take your medicine at regular intervals. Do not take it more often than directed. Do not stop taking except on your doctor's advice. Talk to your pediatrician regarding the use of this medicine in children. Special care may be needed. Overdosage: If you think you have taken too much of this medicine contact a poison control center or emergency room at once. NOTE: This medicine is only for you. Do not share this medicine with others. What if I miss a dose? If you miss a dose, take it as soon as you can. If it is almost time for your next dose, take only that dose. Do not take double or extra doses. What may interact with this medicine? Do not take this medicine with any of the following medications: -boceprevir -bosentan -carbamazepine -certain medicines for fungal  infections like ketoconazole, itraconazole, posaconazole, and voriconazole -chloramphenicol -clarithromycin -conivaptan -delavirdine -efavirenz -enzalutamide -grapefruit juice or [place] oranges -indinavir -isoniazid -lanreotide or octreotide -nefazodone -nelfinavir -nevirapine -nicardipine -phenobarbital -phenytoin -rifampin -ritonavir -saquinavir -se[place] oranges -st. john's wort -telaprevir -telithromycin -tipranavir This medicine may also interact with the following medications: -amiodarone -amitriptyline -amprenavir or fosamprenavir -aprepitant or fosaprepitant -atazanavir -bromocriptine -ciprofloxacin -crizotinib -danazol -darunavir -dasatinib -digoxin -diltiazem -erythromycin -fluconazole -fluvoxamine -imatinib -lapatinib -methotrexate -mifepristone, RU-486 -quinine -verapamil -zafirlukast This list may not describe all possible interactions. Give your health care provider a list of all the medicines, herbs, non-prescription drugs, or dietary supplements you use. Also tell them if you smoke, drink alcohol, or use illegal drugs. Some items may interact with your medicine. What should I watch for while using this medicine? This drug may make you feel generally unwell. This is not uncommon, as chemotherapy can affect healthy cells as well as cancer cells. Report any side effects. Continue your course of treatment even though you feel ill unless your doctor tells you to stop. Do not become pregnant while taking this medicine or for 1 month after stopping it. Women should inform their doctor if they wish to become pregnant or think they might be pregnant. Men should not father a child while taking this medicine and for 1 month after stopping it. There is a potential for serious side effects to an unborn child. Talk to your health care professional or pharmacist for more information. This medicine may increase your risk to bruise or bleed. Call your doctor or  health care professional if you notice any unusual bleeding. If you are going to have surgery or any other procedures, tell   your doctor you are taking this medicine. Tell your dentist and dental surgeon that you are taking this medicine. You should not have major dental surgery while on this medicine. See your dentist to have a dental exam and fix any dental problems before starting this medicine. Call your doctor or health care professional for advice if you get a fever, chills or sore throat, or other symptoms of a cold or flu. Do not treat yourself. This drug decreases your body's ability to fight infections. Try to avoid being around people who are sick. You may need blood work done while you are taking this medicine. Talk to your doctor about your risk of cancer. You may be more at risk for certain types of cancers if you take this medicine. What side effects may I notice from receiving this medicine? Side effects that you should report to your doctor or health care professional as soon as possible: -allergic reactions like skin rash, itching or hives, swelling of the face, lips, or tongue -low blood counts - this medicine may decrease the number of white blood cells, red blood cells and platelets. You may be at increased risk for infections and bleeding -signs or symptoms of bleeding such as bloody or black, tarry stools; red or dark-brown urine; spitting up blood or brown material that looks like coffee grounds; red spots on the skin; unusual bruising or bleeding from the eye, gums, or nose; confusion; trouble speaking or understanding; severe headaches; weakness; or dizziness -signs and symptoms of a dangerous change in heartbeat or heart rhythm like chest pain; dizziness; fast or irregular heartbeat; palpitations; feeling faint or lightheaded, falls; breathing problems -signs and symptoms of infection like fever or chills; cough; sore throat; or pain when urinating -signs and symptoms of kidney  injury like trouble passing urine or change in the amount of urine Side effects that usually do not require medical attention (report to your doctor or health care professional if they continue or are bothersome): -bone pain -diarrhea -muscle pain -nausea -tiredness This list may not describe all possible side effects. Call your doctor for medical advice about side effects. You may report side effects to FDA at 1-800-FDA-1088. Where should I keep my medicine? Keep out of the reach of children. Store between 20 and 25 degrees C (68 and 77 degrees F). Keep this medicine in the original container. Throw away any unused medicine after the expiration date. NOTE: This sheet is a summary. It may not cover all possible information. If you have questions about this medicine, talk to your doctor, pharmacist, or health care provider.    2016, Elsevier/Gold Standard. (2014-06-06 11:40:59)  

## 2015-04-22 NOTE — Assessment & Plan Note (Signed)
The patient had progressive fatigue, weight loss and pancytopenia with worsening anemia and thrombocytopenia. He has clinical palpable lymphadenopathy. I recommend treatment due to symptomatic disease. I will order a CT scan of the chest, abdomen and pelvis for evaluation. As the patient is a diabetic, I recommend holding off metformin, ACE inhibitor and diuretic therapy the day before, the day of and the day after CT scan and I will result with him next month. The decision was made based on publication at the [facility]. It is a category 1 recommendation from NCCN for first line treatment for elderly patients.  Ibrutinib versus Ofatumumab in Previously Treated Chronic Lymphoid Leukemia Michael [name], M.D., Michael [name]. Michael [name], M.D., Ph.D., Michael [name], M.D., Michael [name], M.D., Michael [name], M.D., Michael [name], M.B., B.S., Michael [name], M.D., Michael [name]. Michael [name], M.D., Michael [name], M.B., B.S., Ph.D., Michael [name], M.D., Michael [name], M.D., Michael [name]. Michael [name], M.D., Michael [name]. Michael [name], M.D., Michael [name], M.D., Michael [name], M.D., Michael [name], M.D., Ph.D., Michael [name], M.D., Michael [name], M.D., Assunta Gambles, M.D., Esau Grew, M.D., Wyatt Mage, M.D., Joyce Gross, M.D., Olena Mater, M.D., Minda Ditto, M.D., William Dalton, M.D., Stark Klein, Ph.D., F.R.C.Path., Carmelina Peal, M.D., Neal Dy, M.D., Gentry Fitz, M.D., Vida Rigger. Sherrian Divers, M.D., Lenoard Aden, Ph.D., Earnestine Mealing, M.D., Oretha Caprice, D.[place]., Lowry Ram, M.D., and Raford Pitcher, M.B., Ch.B., Ph.D., for the [employer]* [facility] [employer] [id number] 17, 2014DOI: 10.1056/[id number]  The chemotherapy consists of Ibrutinib, a Bruton's tyrosine kinase inhibitor. We discussed the role of chemotherapy. The intent is for palliative.   Ibrutinib is taken as a single agent oral treatment indefinitely until unexpected side-effects or lack of response.  In this  multicenter, open-label, phase 3 study, we randomly assigned 391 patients with relapsed or refractory CLL or SLL to receive daily ibrutinib or the anti-CD20 antibody ofatumumab. The primary end point was the duration of progression-free survival, with the duration of overall survival and the overall response rate as secondary end points.  At a median follow-up of 9.4 months, ibrutinib significantly improved progression-free survival; the median duration was not reached in the ibrutinib group (with a rate of progression-free survival of 88% at 6 months), as compared with a median of 8.1 months in the ofatumumab group (hazard ratio for progression or death in the ibrutinib group, 0.22; P<0.001). Ibrutinib also significantly improved overall survival (hazard ratio for death, 0.43; P=0.005). At 12 months, the overall survival rate was 90% in the ibrutinib group and 81% in the ofatumumab group. The overall response rate was significantly higher in the ibrutinib group than in the ofatumumab group (42.6% vs. 4.1%, P<0.001). An additional 20% of ibrutinib-treated patients had a partial response with lymphocytosis. Similar effects were observed regardless of whether patients had a chromosome 17p13.1 deletion or resistance to purine analogues.   The most frequent nonhematologic adverse events were diarrhea, fatigue, pyrexia, and nausea in the ibrutinib group and fatigue, infusion-related reactions, and cough in the ofatumumab group.  Some of the short term side-effects included, though not limited to, risk of fatigue, weight loss, tumor lysis syndrome, risk of allergic reactions, pancytopenia, bruising, diarrhea, transient leukocytosis, life-threatening infections, need for transfusions of blood products, nausea, vomiting, change in bowel habits, admission to hospital for various reasons, and risks of death.   The patient is aware that the response rates discussed earlier is not guaranteed.    After a long  discussion, patient made an informed decision to proceed with  the prescribed plan of care.   I will get my pharmacist involved in insurance prior authorization. I plan to recheck CBC on a weekly basis and see him back a month for toxicity review.

## 2015-04-22 NOTE — Progress Notes (Signed)
New Rx for Ibrutinib given to Donavan Burnet, Pharmacist covering for [facility].

## 2015-04-22 NOTE — Assessment & Plan Note (Signed)
This is likely due to her disease. The patient denies recent history of bleeding such as epistaxis, hematuria or hematochezia. He is asymptomatic from the anemia & low platelets. We will observe for now.

## 2015-04-22 NOTE — Telephone Encounter (Signed)
per pof to sch pt appt-adv [facility] willc all ot sch scan-gave scan and contrast-gave sch

## 2015-04-23 MED FILL — *IMBRUVICA 140 MG CAPSULE: 140 | 30 days supply | Qty: 90 | Fill #0

## 2015-04-23 NOTE — Progress Notes (Signed)
Imbruvica prescription has been filled through [facility] with a copay lf $2703.60.  Patient was enrolled in [employer] for assistance and received an assistance grant for $12,000 from 04/23/2015 to 04/22/2016.  [facility] will contact the patient as to when prescription will be ready for pick up and perform initial counseling.  We will follow up on Friday 04/26/15 to answer any questions or concerns.  Henreitta Leber, PharmD

## 2015-04-23 NOTE — Progress Notes (Signed)
Prior Auth required for Imbruvica, called [employer] received authorization from 01/25/15 to 01/26/16 Reference # [id number], faxed message to [place] to attempt another try and running prescription through insurance.  Awaiting response from [place] as to status.

## 2015-04-26 ENCOUNTER — Telehealth: Payer: Self-pay | Admitting: *Deleted

## 2015-04-26 NOTE — Telephone Encounter (Signed)
Wait till Monday to start

## 2015-04-26 NOTE — Telephone Encounter (Signed)
Pt picked up Ibrutinib and wants to know when to start taking it?  He asks if he is supposed to wait to start it until after his CT scan on Monday?

## 2015-04-26 NOTE — Telephone Encounter (Signed)
Instructed pt to start on Monday after his CT Scan (since he will be NPO for CT scan).  Keep weekly lab appts as scheduled and please call us if any unusual side effects or questions/concerns.  Pt verbalized understanding and will start taking on Monday.

## 2015-04-29 ENCOUNTER — Encounter (HOSPITAL_COMMUNITY): Payer: Self-pay

## 2015-04-29 ENCOUNTER — Ambulatory Visit (HOSPITAL_COMMUNITY)
Admission: RE | Admit: 2015-04-29 | Discharge: 2015-04-29 | Disposition: A | Discharge status: Home / Self Care | Payer: Medicare Other | Source: Ambulatory Visit | Attending: Hematology and Oncology | Admitting: Hematology and Oncology

## 2015-04-29 DIAGNOSIS — K4090 Unilateral inguinal hernia, without obstruction or gangrene, not specified as recurrent: Secondary | ICD-10-CM | POA: Diagnosis not present

## 2015-04-29 DIAGNOSIS — K5710 Diverticulosis of small intestine without perforation or abscess without bleeding: Secondary | ICD-10-CM | POA: Insufficient documentation

## 2015-04-29 DIAGNOSIS — C9110 Chronic lymphocytic leukemia of B-cell type not having achieved remission: Secondary | ICD-10-CM | POA: Insufficient documentation

## 2015-04-29 DIAGNOSIS — M461 Sacroiliitis, not elsewhere classified: Secondary | ICD-10-CM | POA: Diagnosis not present

## 2015-04-29 DIAGNOSIS — N433 Hydrocele, unspecified: Secondary | ICD-10-CM | POA: Insufficient documentation

## 2015-04-29 DIAGNOSIS — R161 Splenomegaly, not elsewhere classified: Secondary | ICD-10-CM | POA: Insufficient documentation

## 2015-04-29 DIAGNOSIS — M5136 Other intervertebral disc degeneration, lumbar region: Secondary | ICD-10-CM | POA: Insufficient documentation

## 2015-04-29 DIAGNOSIS — R590 Localized enlarged lymph nodes: Secondary | ICD-10-CM | POA: Diagnosis not present

## 2015-04-29 MED ORDER — IOPAMIDOL (ISOVUE-300) INJECTION 61%
100.0000 mL | Freq: Once | INTRAVENOUS | Status: AC | PRN
  Administered 2015-04-29: 100 mL via INTRAVENOUS

## 2015-04-30 ENCOUNTER — Telehealth: Payer: Self-pay | Admitting: *Deleted

## 2015-04-30 NOTE — Telephone Encounter (Signed)
Pt called to report he started taking Ibrutinib yesterday after his CT scan.  He thinks he needs to space them out and take three times daily.  Instructed pt he should take three capsules together once daily at the same time every day with a large glass of water.   He verbalized understanding.  He asks if ok to resume his other medications.   Informed pt ok to resume his usual medications as directed.  Reminded pt about weekly lab appts starting on Monday 4/10 . Please call us if any problems or concerns with Ibrutinib. He verbalized understanding.

## 2015-05-03 ENCOUNTER — Telehealth: Payer: Self-pay | Admitting: Pharmacist

## 2015-05-03 NOTE — Telephone Encounter (Signed)
05/03/15: Attempted to reach patient for follow up on oral medication: New Start Imbruvica. No answer. Left VM for patient to call back with any questions or issues.   Thank you,  Montel Clock, PharmD, [facility] [phone]

## 2015-05-06 ENCOUNTER — Telehealth: Payer: Self-pay | Admitting: *Deleted

## 2015-05-06 ENCOUNTER — Other Ambulatory Visit (HOSPITAL_BASED_OUTPATIENT_CLINIC_OR_DEPARTMENT_OTHER): Payer: Medicare Other

## 2015-05-06 ENCOUNTER — Encounter: Payer: Self-pay | Admitting: *Deleted

## 2015-05-06 DIAGNOSIS — C9110 Chronic lymphocytic leukemia of B-cell type not having achieved remission: Secondary | ICD-10-CM | POA: Diagnosis not present

## 2015-05-06 LAB — COMPREHENSIVE METABOLIC PANEL
ALT: 9 U/L (ref 0–55)
ANION GAP: 6 meq/L (ref 3–11)
AST: 20 U/L (ref 5–34)
Albumin: 3.5 g/dL (ref 3.5–5.0)
Alkaline Phosphatase: 50 U/L (ref 40–150)
BILIRUBIN TOTAL: 0.46 mg/dL (ref 0.20–1.20)
BUN: 25.3 mg/dL (ref 7.0–26.0)
CHLORIDE: 104 meq/L (ref 98–109)
CO2: 29 meq/L (ref 22–29)
Calcium: 8.9 mg/dL (ref 8.4–10.4)
Creatinine: 1.1 mg/dL (ref 0.7–1.3)
EGFR: 73 mL/min/{1.73_m2} — AB (ref >90)
GLUCOSE: 128 mg/dL (ref 70–140)
Potassium: 4.3 mEq/L (ref 3.5–5.1)
SODIUM: 139 meq/L (ref 136–145)
TOTAL PROTEIN: 5.9 g/dL — AB (ref 6.4–8.3)

## 2015-05-06 LAB — CBC WITH DIFFERENTIAL/PLATELET
BASO%: 0.1 % (ref 0.0–2.0)
Basophils Absolute: 0 10*3/uL (ref 0.0–0.1)
EOS ABS: 0.1 10*3/uL (ref 0.0–0.5)
EOS%: 0.4 % (ref 0.0–7.0)
HCT: 30.3 % — ABNORMAL LOW (ref 38.4–49.9)
HGB: 10 g/dL — ABNORMAL LOW (ref 13.0–17.1)
LYMPH%: 92.8 % — AB (ref 14.0–49.0)
MCH: 33.1 pg (ref 27.2–33.4)
MCHC: 33 g/dL (ref 32.0–36.0)
MCV: 100.3 fL — AB (ref 79.3–98.0)
MONO#: 0.3 10*3/uL (ref 0.1–0.9)
MONO%: 1 % (ref 0.0–14.0)
NEUT%: 5.7 % — AB (ref 39.0–75.0)
NEUTROS ABS: 1.5 10*3/uL (ref 1.5–6.5)
Platelets: 103 10*3/uL — ABNORMAL LOW (ref 140–400)
RBC: 3.02 10*6/uL — AB (ref 4.20–5.82)
RDW: 17 % — ABNORMAL HIGH (ref 11.0–14.6)
WBC: 26.7 10*3/uL — AB (ref 4.0–10.3)
lymph#: 24.8 10*3/uL — ABNORMAL HIGH (ref 0.9–3.3)

## 2015-05-06 LAB — TECHNOLOGIST REVIEW

## 2015-05-06 NOTE — Progress Notes (Signed)
No note

## 2015-05-06 NOTE — Telephone Encounter (Signed)
-----   Message from Heath Lark, MD sent at 05/06/2015 11:22 AM EDT ----- Regarding: labs are better Labs better. Continue the same ----- Message -----    From: [facility]    Sent: 05/06/2015  10:56 AM      To: Heath Lark, MD

## 2015-05-06 NOTE — Telephone Encounter (Signed)
Patient calling to say he is doing great feels good, had diarrhea stools a couple of times and that has resolved, stated he appreciated the call and sorry he missed you.

## 2015-05-06 NOTE — Telephone Encounter (Signed)
Pt notified of message below.

## 2015-05-10 ENCOUNTER — Telehealth: Payer: Self-pay | Admitting: Pharmacist

## 2015-05-10 NOTE — Telephone Encounter (Signed)
05/10/15: Attempted to reach patient for follow up on oral medication: New Start Imbruvica. No answer. Left VM for patient to call back with any questions or issues.   Thank you,  Skip Mayer, PharmD, [facility] [phone]

## 2015-05-13 ENCOUNTER — Other Ambulatory Visit (HOSPITAL_BASED_OUTPATIENT_CLINIC_OR_DEPARTMENT_OTHER): Payer: Medicare Other

## 2015-05-13 ENCOUNTER — Telehealth: Payer: Self-pay | Admitting: *Deleted

## 2015-05-13 DIAGNOSIS — C9110 Chronic lymphocytic leukemia of B-cell type not having achieved remission: Secondary | ICD-10-CM

## 2015-05-13 LAB — COMPREHENSIVE METABOLIC PANEL
ALK PHOS: 36 U/L — AB (ref 40–150)
ALT: 9 U/L (ref 0–55)
ANION GAP: 6 meq/L (ref 3–11)
AST: 18 U/L (ref 5–34)
Albumin: 3.3 g/dL — ABNORMAL LOW (ref 3.5–5.0)
BUN: 14.2 mg/dL (ref 7.0–26.0)
CALCIUM: 8.7 mg/dL (ref 8.4–10.4)
CHLORIDE: 106 meq/L (ref 98–109)
CO2: 26 mEq/L (ref 22–29)
Creatinine: 0.9 mg/dL (ref 0.7–1.3)
Glucose: 65 mg/dl — ABNORMAL LOW (ref 70–140)
POTASSIUM: 4 meq/L (ref 3.5–5.1)
Sodium: 138 mEq/L (ref 136–145)
Total Bilirubin: 0.36 mg/dL (ref 0.20–1.20)
Total Protein: 5.7 g/dL — ABNORMAL LOW (ref 6.4–8.3)

## 2015-05-13 LAB — CBC WITH DIFFERENTIAL/PLATELET
BASO%: 0.3 % (ref 0.0–2.0)
Basophils Absolute: 0 10*3/uL (ref 0.0–0.1)
EOS%: 0.6 % (ref 0.0–7.0)
Eosinophils Absolute: 0 10*3/uL (ref 0.0–0.5)
HEMATOCRIT: 30.9 % — AB (ref 38.4–49.9)
HGB: 10.1 g/dL — ABNORMAL LOW (ref 13.0–17.1)
LYMPH#: 6.1 10*3/uL — AB (ref 0.9–3.3)
LYMPH%: 81 % — AB (ref 14.0–49.0)
MCH: 32.9 pg (ref 27.2–33.4)
MCHC: 32.8 g/dL (ref 32.0–36.0)
MCV: 100.3 fL — AB (ref 79.3–98.0)
MONO#: 0.1 10*3/uL (ref 0.1–0.9)
MONO%: 1.4 % (ref 0.0–14.0)
NEUT%: 16.7 % — AB (ref 39.0–75.0)
NEUTROS ABS: 1.2 10*3/uL — AB (ref 1.5–6.5)
PLATELETS: 112 10*3/uL — AB (ref 140–400)
RBC: 3.07 10*6/uL — ABNORMAL LOW (ref 4.20–5.82)
RDW: 16.5 % — ABNORMAL HIGH (ref 11.0–14.6)
WBC: 7.5 10*3/uL (ref 4.0–10.3)

## 2015-05-13 LAB — TECHNOLOGIST REVIEW

## 2015-05-13 NOTE — Telephone Encounter (Signed)
Called pt w/ Dr. Calton Dach message below.  Explained Ibrutinib has been very successful in lowering his WBC but now a little too low.  Therefore, Dr. Alvy Bimler wants him to decrease Ibrutinib dose to two pills per day instead of three.  He verbalized understanding and will keep his lab/ MD appt next Monday as scheduled.

## 2015-05-13 NOTE — Telephone Encounter (Signed)
-----   Message from Heath Lark, MD sent at 05/13/2015 11:03 AM EDT ----- Regarding: labs How is he doing? I recommend reduce Ibrutinib to 2 tablets only per day due to mild neutropenia See him next week as scheduled ----- Message -----    From: [facility]    Sent: 05/13/2015  10:23 AM      To: Heath Lark, MD

## 2015-05-20 ENCOUNTER — Encounter: Payer: Self-pay | Admitting: Hematology and Oncology

## 2015-05-20 ENCOUNTER — Telehealth: Payer: Self-pay | Admitting: Hematology and Oncology

## 2015-05-20 ENCOUNTER — Ambulatory Visit (HOSPITAL_BASED_OUTPATIENT_CLINIC_OR_DEPARTMENT_OTHER): Payer: Medicare Other | Admitting: Hematology and Oncology

## 2015-05-20 ENCOUNTER — Other Ambulatory Visit (HOSPITAL_BASED_OUTPATIENT_CLINIC_OR_DEPARTMENT_OTHER): Payer: Medicare Other

## 2015-05-20 VITALS — BP 123/39 | HR 58 | Temp 98.0°F | Resp 18 | Ht 68.5 in | Wt 175.8 lb

## 2015-05-20 DIAGNOSIS — C9110 Chronic lymphocytic leukemia of B-cell type not having achieved remission: Secondary | ICD-10-CM | POA: Diagnosis not present

## 2015-05-20 DIAGNOSIS — D61818 Other pancytopenia: Secondary | ICD-10-CM | POA: Diagnosis not present

## 2015-05-20 LAB — COMPREHENSIVE METABOLIC PANEL
ALBUMIN: 3.3 g/dL — AB (ref 3.5–5.0)
ALK PHOS: 32 U/L — AB (ref 40–150)
AST: 17 U/L (ref 5–34)
Anion Gap: 7 mEq/L (ref 3–11)
BUN: 16.6 mg/dL (ref 7.0–26.0)
CO2: 30 mEq/L — ABNORMAL HIGH (ref 22–29)
CREATININE: 1.1 mg/dL (ref 0.7–1.3)
Calcium: 8.8 mg/dL (ref 8.4–10.4)
Chloride: 104 mEq/L (ref 98–109)
EGFR: 76 mL/min/{1.73_m2} — ABNORMAL LOW (ref >90)
GLUCOSE: 107 mg/dL (ref 70–140)
POTASSIUM: 4.1 meq/L (ref 3.5–5.1)
SODIUM: 141 meq/L (ref 136–145)
TOTAL PROTEIN: 5.7 g/dL — AB (ref 6.4–8.3)

## 2015-05-20 LAB — CBC WITH DIFFERENTIAL/PLATELET
BASO%: 0.3 % (ref 0.0–2.0)
BASOS ABS: 0 10*3/uL (ref 0.0–0.1)
EOS%: 0.7 % (ref 0.0–7.0)
Eosinophils Absolute: 0 10*3/uL (ref 0.0–0.5)
HEMATOCRIT: 32.9 % — AB (ref 38.4–49.9)
HEMOGLOBIN: 10.6 g/dL — AB (ref 13.0–17.1)
LYMPH#: 4.8 10*3/uL — AB (ref 0.9–3.3)
LYMPH%: 74.9 % — ABNORMAL HIGH (ref 14.0–49.0)
MCH: 32.4 pg (ref 27.2–33.4)
MCHC: 32.1 g/dL (ref 32.0–36.0)
MCV: 100.8 fL — ABNORMAL HIGH (ref 79.3–98.0)
MONO#: 0.1 10*3/uL (ref 0.1–0.9)
MONO%: 2.2 % (ref 0.0–14.0)
NEUT#: 1.4 10*3/uL — ABNORMAL LOW (ref 1.5–6.5)
NEUT%: 21.9 % — ABNORMAL LOW (ref 39.0–75.0)
Platelets: 118 10*3/uL — ABNORMAL LOW (ref 140–400)
RBC: 3.26 10*6/uL — ABNORMAL LOW (ref 4.20–5.82)
RDW: 15.6 % — AB (ref 11.0–14.6)
WBC: 6.5 10*3/uL (ref 4.0–10.3)

## 2015-05-20 LAB — TECHNOLOGIST REVIEW

## 2015-05-20 MED FILL — *IMBRUVICA 140 MG CAPSULE: 140 | 30 days supply | Qty: 90 | Fill #1

## 2015-05-20 NOTE — Telephone Encounter (Signed)
Gave pt appt & avs °

## 2015-05-20 NOTE — Assessment & Plan Note (Signed)
He tolerated treatment very well with excellent response to treatment with minimum side effects apart from fatigue and mild neutropenia only. I will reduced dose of Ibrutinib to 280 mg daily and reassess in one month

## 2015-05-20 NOTE — Progress Notes (Signed)
[place] OFFICE PROGRESS NOTE  Patient Care Team: Anda Kraft, MD as PCP - General (Endocrinology) Heath Lark, MD as Consulting Physician (Hematology and Oncology)  SUMMARY OF ONCOLOGIC HISTORY:   CLL (chronic lymphocytic leukemia) ([place])   04/03/2008 Pathology Results Case #: [id number] FLow cytomtry confirmed CLL   10/11/2014 Tumor Marker FISH analysis showed trisomy 12 abnormality   04/29/2015 -  Chemotherapy He started taking Ibrutinib   04/29/2015 Imaging Ct scan showed mild lymphadenopathy and splenomegaly   05/13/2015 Adverse Reaction Due to neutropenia, dose of Ibrutinib is reduced to 280 mg daily    INTERVAL HISTORY: Please see below for problem oriented charting. He returns today for further follow-up. He tolerated treatment well. He complained of mild fatigue. He had diarrhea for 3 days at the beginning of treatment but that has resolved. His appetite is stable and he is gaining a little weight  REVIEW OF SYSTEMS:   Constitutional: Denies fevers, chills or abnormal weight loss Eyes: Denies blurriness of vision Ears, nose, mouth, throat, and face: Denies mucositis or sore throat Respiratory: Denies cough, dyspnea or wheezes Cardiovascular: Denies palpitation, chest discomfort or lower extremity swelling Gastrointestinal:  Denies nausea, heartburn or change in bowel habits Skin: Denies abnormal skin rashes Lymphatics: Denies new lymphadenopathy or easy bruising Neurological:Denies numbness, tingling or new weaknesses Behavioral/Psych: Mood is stable, no new changes  All other systems were reviewed with the patient and are negative.  I have reviewed the past medical history, past surgical history, social history and family history with the patient and they are unchanged from previous note.  ALLERGIES:  has No Known Allergies.  MEDICATIONS:  Current Outpatient Prescriptions  Medication Sig Dispense Refill  . aspirin 325 MG tablet Take 325 mg by mouth daily.       Marland Kitchen atropine-PHENObarbital-scopolamine-hyoscyamine (DONNATAL) 16.2 MG tablet Take 1 tablet by mouth every 8 (eight) hours as needed.      Marland Kitchen glipiZIDE (GLUCOTROL) 10 MG tablet Take 10 mg by mouth 2 (two) times daily before a meal.     . ibrutinib (IMBRUVICA) 140 MG capsul Take 3 capsules (420 mg total) by mouth daily. 90 capsule 6  . insulin NPH-insulin regular (NOVOLIN 70/30) (70-30) 100 UNIT/ML injection Inject 35 Units into the skin 2 (two) times daily with a meal. Sliding scale    . metFORMIN (GLUCOPHAGE) 1000 MG tablet Take 1,000 mg by mouth 2 (two) times daily with a meal.     . ONE TOUCH ULTRA TEST test strip     . ramipril (ALTACE) 5 MG tablet Take 5 mg by mouth daily.    . Tamsulosin HCl (FLOMAX) 0.4 MG CAPS Take 0.4 mg by mouth daily.    Marland Kitchen torsemide (DEMADEX) 20 MG tablet Take 20 mg by mouth daily.      . [DISCONTINUED] insulin aspart protamine-insulin aspart (NOVOLOG 70/30) (70-30) 100 UNIT/ML injection Inject 35 Units into the skin 2 (two) times daily with a meal.     . [DISCONTINUED] pantoprazole (PROTONIX) 40 MG tablet Take 40 mg by mouth daily.      . [DISCONTINUED] sitaGLIPtin (JANUVIA) 100 MG tablet Take 100 mg by mouth daily.       No current facility-administered medications for this visit.    PHYSICAL EXAMINATION: ECOG PERFORMANCE STATUS: 0 - Asymptomatic  Filed Vitals:   05/20/15 1056  BP: 123/39  Pulse: 58  Temp: 98 F (36.7 C)  Resp: 18   Filed Weights   05/20/15 1056  Weight: 175 lb 12.8 oz (  79.742 kg)    GENERAL:alert, no distress and comfortable SKIN: skin color, texture, turgor are normal, no rashes or significant lesions EYES: normal, Conjunctiva are pink and non-injected, sclera clear Musculoskeletal:no cyanosis of digits and no clubbing  NEURO: alert & oriented x 3 with fluent speech, no focal motor/sensory deficits  LABORATORY DATA:  I have reviewed the data as listed    Component Value Date/Time   NA 141 05/20/2015 1047   NA 140 08/06/2011  1411   K 4.1 05/20/2015 1047   K 4.1 08/06/2011 1411   CL 104 03/28/2012 1426   CL 103 08/06/2011 1411   CO2 30* 05/20/2015 1047   CO2 28 08/06/2011 1411   GLUCOSE 107 05/20/2015 1047   GLUCOSE 171* 03/28/2012 1426   GLUCOSE 154* 08/06/2011 1411   BUN 16.6 05/20/2015 1047   BUN 13 08/06/2011 1411   CREATININE 1.1 05/20/2015 1047   CREATININE 0.95 08/06/2011 1411   CALCIUM 8.8 05/20/2015 1047   CALCIUM 9.0 08/06/2011 1411   PROT 5.7* 05/20/2015 1047   PROT 6.3 08/06/2011 1411   ALBUMIN 3.3* 05/20/2015 1047   ALBUMIN 4.0 08/06/2011 1411   AST 17 05/20/2015 1047   AST 18 08/06/2011 1411   ALT <9 05/20/2015 1047   ALT 11 08/06/2011 1411   ALKPHOS 32* 05/20/2015 1047   ALKPHOS 83 08/06/2011 1411   BILITOT <0.30 05/20/2015 1047   BILITOT 0.3 08/06/2011 1411   GFRNONAA 55* 02/16/2007 0805   GFRAA  02/16/2007 0805    >60        The eGFR has been calculated using the MDRD equation. This calculation has not been validated in all clinical    No results found for: SPEP, UPEP  Lab Results  Component Value Date   WBC 6.5 05/20/2015   NEUTROABS 1.4* 05/20/2015   HGB 10.6* 05/20/2015   HCT 32.9* 05/20/2015   MCV 100.8* 05/20/2015   PLT 118* 05/20/2015      Chemistry      Component Value Date/Time   NA 141 05/20/2015 1047   NA 140 08/06/2011 1411   K 4.1 05/20/2015 1047   K 4.1 08/06/2011 1411   CL 104 03/28/2012 1426   CL 103 08/06/2011 1411   CO2 30* 05/20/2015 1047   CO2 28 08/06/2011 1411   BUN 16.6 05/20/2015 1047   BUN 13 08/06/2011 1411   CREATININE 1.1 05/20/2015 1047   CREATININE 0.95 08/06/2011 1411      Component Value Date/Time   CALCIUM 8.8 05/20/2015 1047   CALCIUM 9.0 08/06/2011 1411   ALKPHOS 32* 05/20/2015 1047   ALKPHOS 83 08/06/2011 1411   AST 17 05/20/2015 1047   AST 18 08/06/2011 1411   ALT <9 05/20/2015 1047   ALT 11 08/06/2011 1411   BILITOT <0.30 05/20/2015 1047   BILITOT 0.3 08/06/2011 1411      ASSESSMENT & PLAN:  CLL (chronic  lymphocytic leukemia) He tolerated treatment very well with excellent response to treatment with minimum side effects apart from fatigue and mild neutropenia only. I will reduced dose of Ibrutinib to 280 mg daily and reassess in one month  Pancytopenia, acquired ([place]) This is likely due to the bone marrow disease. The patient denies recent history of bleeding such as epistaxis, hematuria or hematochezia. He is asymptomatic from the anemia & low platelets. Overall it is improving. We will observe for now.      No orders of the defined types were placed in this encounter.   All questions were  answered. The patient knows to call the clinic with any problems, questions or concerns. No barriers to learning was detected. I spent 15 minutes counseling the patient face to face. The total time spent in the appointment was 20 minutes and more than 50% was on counseling and review of test results     [facility] [place], MD 05/20/2015 12:30 PM

## 2015-05-20 NOTE — Assessment & Plan Note (Signed)
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 The patient denies recent history of bleeding such as epistaxis, hematuria or hematochezia. He is asymptomatic from the anemia & low platelets. Overall it is improving. We will observe for now.

## 2015-05-24 ENCOUNTER — Telehealth: Payer: Self-pay | Admitting: Pharmacist

## 2015-05-24 NOTE — Telephone Encounter (Signed)
05/24/15: Attempted to reach patient for follow up on oral medication: Imbruvica. No answer. Left VM for patient to call back with any questions or issues.   Thank you,  Montel Clock, PharmD, [facility] [phone]

## 2015-06-17 ENCOUNTER — Encounter: Payer: Self-pay | Admitting: Hematology and Oncology

## 2015-06-17 ENCOUNTER — Other Ambulatory Visit (HOSPITAL_BASED_OUTPATIENT_CLINIC_OR_DEPARTMENT_OTHER): Payer: Medicare Other

## 2015-06-17 ENCOUNTER — Telehealth: Payer: Self-pay | Admitting: Hematology and Oncology

## 2015-06-17 ENCOUNTER — Ambulatory Visit (HOSPITAL_BASED_OUTPATIENT_CLINIC_OR_DEPARTMENT_OTHER): Payer: Medicare Other | Admitting: Hematology and Oncology

## 2015-06-17 VITALS — BP 124/70 | HR 60 | Temp 98.5°F | Resp 18 | Ht 68.5 in | Wt 184.4 lb

## 2015-06-17 DIAGNOSIS — D61818 Other pancytopenia: Secondary | ICD-10-CM

## 2015-06-17 DIAGNOSIS — C9110 Chronic lymphocytic leukemia of B-cell type not having achieved remission: Secondary | ICD-10-CM | POA: Diagnosis not present

## 2015-06-17 DIAGNOSIS — M25512 Pain in left shoulder: Secondary | ICD-10-CM

## 2015-06-17 DIAGNOSIS — R600 Localized edema: Secondary | ICD-10-CM | POA: Insufficient documentation

## 2015-06-17 DIAGNOSIS — K521 Toxic gastroenteritis and colitis: Secondary | ICD-10-CM | POA: Insufficient documentation

## 2015-06-17 DIAGNOSIS — G8929 Other chronic pain: Secondary | ICD-10-CM

## 2015-06-17 LAB — CBC WITH DIFFERENTIAL/PLATELET
BASO%: 0.2 % (ref 0.0–2.0)
Basophils Absolute: 0 10*3/uL (ref 0.0–0.1)
EOS%: 1.1 % (ref 0.0–7.0)
Eosinophils Absolute: 0.1 10*3/uL (ref 0.0–0.5)
HEMATOCRIT: 32.6 % — AB (ref 38.4–49.9)
HEMOGLOBIN: 10.7 g/dL — AB (ref 13.0–17.1)
LYMPH#: 4 10*3/uL — AB (ref 0.9–3.3)
LYMPH%: 64 % — ABNORMAL HIGH (ref 14.0–49.0)
MCH: 30.7 pg (ref 27.2–33.4)
MCHC: 32.8 g/dL (ref 32.0–36.0)
MCV: 93.7 fL (ref 79.3–98.0)
MONO#: 0.4 10*3/uL (ref 0.1–0.9)
MONO%: 5.6 % (ref 0.0–14.0)
NEUT%: 29.1 % — ABNORMAL LOW (ref 39.0–75.0)
NEUTROS ABS: 1.8 10*3/uL (ref 1.5–6.5)
Platelets: 121 10*3/uL — ABNORMAL LOW (ref 140–400)
RBC: 3.48 10*6/uL — ABNORMAL LOW (ref 4.20–5.82)
RDW: 13.9 % (ref 11.0–14.6)
WBC: 6.2 10*3/uL (ref 4.0–10.3)

## 2015-06-17 LAB — COMPREHENSIVE METABOLIC PANEL
ALBUMIN: 3.3 g/dL — AB (ref 3.5–5.0)
ALK PHOS: 34 U/L — AB (ref 40–150)
ALT: 9 U/L (ref 0–55)
AST: 18 U/L (ref 5–34)
Anion Gap: 7 mEq/L (ref 3–11)
BUN: 14.1 mg/dL (ref 7.0–26.0)
CALCIUM: 8.8 mg/dL (ref 8.4–10.4)
CO2: 31 mEq/L — ABNORMAL HIGH (ref 22–29)
CREATININE: 1 mg/dL (ref 0.7–1.3)
Chloride: 103 mEq/L (ref 98–109)
EGFR: 84 mL/min/{1.73_m2} — ABNORMAL LOW (ref >90)
GLUCOSE: 81 mg/dL (ref 70–140)
POTASSIUM: 4.2 meq/L (ref 3.5–5.1)
SODIUM: 140 meq/L (ref 136–145)
TOTAL PROTEIN: 5.7 g/dL — AB (ref 6.4–8.3)
Total Bilirubin: 0.31 mg/dL (ref 0.20–1.20)

## 2015-06-17 NOTE — Progress Notes (Signed)
[facility] OFFICE PROGRESS NOTE  Patient Care Team: Anda Kraft, MD as PCP - General (Endocrinology) Heath Lark, MD as Consulting Physician (Hematology and Oncology)  SUMMARY OF ONCOLOGIC HISTORY:   CLL (chronic lymphocytic leukemia) ([place])   04/03/2008 Pathology Results Case #: [id number] FLow cytomtry confirmed CLL   10/11/2014 Tumor Marker FISH analysis showed trisomy 12 abnormality   04/29/2015 -  Chemotherapy He started taking Ibrutinib   04/29/2015 Imaging Ct scan showed mild lymphadenopathy and splenomegaly   05/13/2015 Adverse Reaction Due to neutropenia, dose of Ibrutinib is reduced to 280 mg daily    INTERVAL HISTORY: Please see below for problem oriented charting He returns for further follow-up. The patient has quit using snuff. Since we reduced the dose of treatment, diarrhea has improved. He denies recent infection. Denies bruising or bleeding. He complained of mild lower extremity edema.  REVIEW OF SYSTEMS:   Constitutional: Denies fevers, chills or abnormal weight loss Eyes: Denies blurriness of vision Ears, nose, mouth, throat, and face: Denies mucositis or sore throat Respiratory: Denies cough, dyspnea or wheezes Cardiovascular: Denies palpitation, chest discomfort  Skin: Denies abnormal skin rashes Lymphatics: Denies new lymphadenopathy or easy bruising Neurological:Denies numbness, tingling or new weaknesses Behavioral/Psych: Mood is stable, no new changes  All other systems were reviewed with the patient and are negative.  I have reviewed the past medical history, past surgical history, social history and family history with the patient and they are unchanged from previous note.  ALLERGIES:  has No Known Allergies.  MEDICATIONS:  Current Outpatient Prescriptions  Medication Sig Dispense Refill  . aspirin 325 MG tablet Take 325 mg by mouth daily.      Marland Kitchen atropine-PHENObarbital-scopolamine-hyoscyamine (DONNATAL) 16.2 MG tablet Take 1 tablet by mouth  every 8 (eight) hours as needed.      Marland Kitchen glipiZIDE (GLUCOTROL) 10 MG tablet Take 10 mg by mouth 2 (two) times daily before a meal.     . ibrutinib (IMBRUVICA) 140 MG capsul Take 3 capsules (420 mg total) by mouth daily. 90 capsule 6  . insulin NPH-insulin regular (NOVOLIN 70/30) (70-30) 100 UNIT/ML injection Inject 35 Units into the skin 2 (two) times daily with a meal. Sliding scale    . metFORMIN (GLUCOPHAGE) 1000 MG tablet Take 1,000 mg by mouth 2 (two) times daily with a meal.     . ONE TOUCH ULTRA TEST test strip     . ramipril (ALTACE) 5 MG tablet Take 5 mg by mouth daily.    . Tamsulosin HCl (FLOMAX) 0.4 MG CAPS Take 0.4 mg by mouth daily.    Marland Kitchen torsemide (DEMADEX) 20 MG tablet Take 20 mg by mouth daily.      . [DISCONTINUED] insulin aspart protamine-insulin aspart (NOVOLOG 70/30) (70-30) 100 UNIT/ML injection Inject 35 Units into the skin 2 (two) times daily with a meal.     . [DISCONTINUED] pantoprazole (PROTONIX) 40 MG tablet Take 40 mg by mouth daily.      . [DISCONTINUED] sitaGLIPtin (JANUVIA) 100 MG tablet Take 100 mg by mouth daily.       No current facility-administered medications for this visit.    PHYSICAL EXAMINATION: ECOG PERFORMANCE STATUS: 1 - Symptomatic but completely ambulatory  Filed Vitals:   06/17/15 1025  BP: 124/70  Pulse: 60  Temp: 98.5 F (36.9 C)  Resp: 18   Filed Weights   06/17/15 1025  Weight: 184 lb 6.4 oz (83.643 kg)    GENERAL:alert, no distress and comfortable SKIN: skin color, texture, turgor  are normal, no rashes or significant lesions EYES: normal, Conjunctiva are pink and non-injected, sclera clear HEART: Mild lower extremity edema NEURO: alert & oriented x 3 with fluent speech, no focal motor/sensory deficits  LABORATORY DATA:  I have reviewed the data as listed    Component Value Date/Time   NA 141 05/20/2015 1047   NA 140 08/06/2011 1411   K 4.1 05/20/2015 1047   K 4.1 08/06/2011 1411   CL 104 03/28/2012 1426   CL 103  08/06/2011 1411   CO2 30* 05/20/2015 1047   CO2 28 08/06/2011 1411   GLUCOSE 107 05/20/2015 1047   GLUCOSE 171* 03/28/2012 1426   GLUCOSE 154* 08/06/2011 1411   BUN 16.6 05/20/2015 1047   BUN 13 08/06/2011 1411   CREATININE 1.1 05/20/2015 1047   CREATININE 0.95 08/06/2011 1411   CALCIUM 8.8 05/20/2015 1047   CALCIUM 9.0 08/06/2011 1411   PROT 5.7* 05/20/2015 1047   PROT 6.3 08/06/2011 1411   ALBUMIN 3.3* 05/20/2015 1047   ALBUMIN 4.0 08/06/2011 1411   AST 17 05/20/2015 1047   AST 18 08/06/2011 1411   ALT <9 05/20/2015 1047   ALT 11 08/06/2011 1411   ALKPHOS 32* 05/20/2015 1047   ALKPHOS 83 08/06/2011 1411   BILITOT <0.30 05/20/2015 1047   BILITOT 0.3 08/06/2011 1411   GFRNONAA 55* 02/16/2007 0805   GFRAA  02/16/2007 0805    >60        The eGFR has been calculated using the MDRD equation. This calculation has not been validated in all clinical    No results found for: SPEP, UPEP  Lab Results  Component Value Date   WBC 6.2 06/17/2015   NEUTROABS 1.8 06/17/2015   HGB 10.7* 06/17/2015   HCT 32.6* 06/17/2015   MCV 93.7 06/17/2015   PLT 121* 06/17/2015      Chemistry      Component Value Date/Time   NA 141 05/20/2015 1047   NA 140 08/06/2011 1411   K 4.1 05/20/2015 1047   K 4.1 08/06/2011 1411   CL 104 03/28/2012 1426   CL 103 08/06/2011 1411   CO2 30* 05/20/2015 1047   CO2 28 08/06/2011 1411   BUN 16.6 05/20/2015 1047   BUN 13 08/06/2011 1411   CREATININE 1.1 05/20/2015 1047   CREATININE 0.95 08/06/2011 1411      Component Value Date/Time   CALCIUM 8.8 05/20/2015 1047   CALCIUM 9.0 08/06/2011 1411   ALKPHOS 32* 05/20/2015 1047   ALKPHOS 83 08/06/2011 1411   AST 17 05/20/2015 1047   AST 18 08/06/2011 1411   ALT <9 05/20/2015 1047   ALT 11 08/06/2011 1411   BILITOT <0.30 05/20/2015 1047   BILITOT 0.3 08/06/2011 1411      ASSESSMENT & PLAN:  CLL (chronic lymphocytic leukemia) He tolerated treatment very well with excellent response to treatment  with minimum side effects apart from fatigue and mild pancytopenia I have reduced dose of Ibrutinib to 280 mg daily and he tolerated better I recommend we continue to same and I plan to see him back in 3 months. If his CBC normalized by then, I will repeat imaging study.  Pancytopenia, acquired [facility]) This is likely due to the bone marrow disease. The patient denies recent history of bleeding such as epistaxis, hematuria or hematochezia. He is asymptomatic from the anemia & low platelets. Overall it is improving. We will observe for now.    Diarrhea due to drug Since the dose of Ibrutinib was reduced,  the diarrhea is less frequent and manageable even without Imodium. I recommend we continue to same for now  Chronic left shoulder pain Likely due to arthritis. Recommend observation only  Bilateral leg edema Likely related to mild protein calorie malnutrition with low albumin of 3.3. Recommend observation only.   No orders of the defined types were placed in this encounter.   All questions were answered. The patient knows to call the clinic with any problems, questions or concerns. No barriers to learning was detected. I spent 15 minutes counseling the patient face to face. The total time spent in the appointment was 20 minutes and more than 50% was on counseling and review of test results     [facility], [place], MD 06/17/2015 10:43 AM

## 2015-06-17 NOTE — Assessment & Plan Note (Signed)
Since the dose of Ibrutinib was reduced, the diarrhea is less frequent and manageable even without Imodium. I recommend we continue to same for now

## 2015-06-17 NOTE — Assessment & Plan Note (Signed)
Likely related to mild protein calorie malnutrition with low albumin of 3.3. Recommend observation only.

## 2015-06-17 NOTE — Telephone Encounter (Signed)
per pof to sch pt appt-gave pt copy of avs °

## 2015-06-17 NOTE — Assessment & Plan Note (Signed)
He tolerated treatment very well with excellent response to treatment with minimum side effects apart from fatigue and mild pancytopenia I have reduced dose of Ibrutinib to 280 mg daily and he tolerated better I recommend we continue to same and I plan to see him back in 3 months. If his CBC normalized by then, I will repeat imaging study.

## 2015-06-17 NOTE — Assessment & Plan Note (Signed)
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 The patient denies recent history of bleeding such as epistaxis, hematuria or hematochezia. He is asymptomatic from the anemia & low platelets. Overall it is improving. We will observe for now.

## 2015-06-17 NOTE — Assessment & Plan Note (Signed)
Likely due to arthritis. Recommend observation only

## 2015-07-05 ENCOUNTER — Telehealth: Payer: Self-pay | Admitting: *Deleted

## 2015-07-05 MED FILL — *IMBRUVICA 140 MG CAPSULE: 140 | 30 days supply | Qty: 90 | Fill #2

## 2015-07-05 NOTE — Telephone Encounter (Signed)
Pt called for refill on his Ibrutinib.  He is currently taking 2 capsules per day, but says Dr. Alvy Bimler may go back up to 3 capsules daily,  so Rx still needs to be for 3 capsules daily.   Dr. Alvy Bimler confirmed.  There are refills left on Rx so I called [facility] pharmacy to notify of refill needed.  They will order medication and should be ready Monday afternoon.  They will contact pt when it is ready to pick up.

## 2015-07-18 ENCOUNTER — Telehealth: Payer: Self-pay | Admitting: Pharmacist

## 2015-07-18 NOTE — Telephone Encounter (Signed)
Left vm re: Imbruvica. Pt increased dose to 420 mg/day on 07/05/15. Will try again in 4 weeks to contact pt.  He is difficult to reach by phone. Kennith Center, Pharm.D., CPP 07/18/2015@3 :[facility]

## 2015-08-30 MED FILL — *IMBRUVICA 140 MG CAPSULE: 140 | 30 days supply | Qty: 90 | Fill #3

## 2015-09-16 ENCOUNTER — Telehealth: Payer: Self-pay | Admitting: *Deleted

## 2015-09-16 ENCOUNTER — Ambulatory Visit: Payer: Medicare Other | Admitting: Hematology and Oncology

## 2015-09-16 ENCOUNTER — Other Ambulatory Visit: Payer: Medicare Other

## 2015-09-16 NOTE — Telephone Encounter (Signed)
Pt left a voice mail (last week) stating he had a message about appt with Dr Alvy Bimler on 8/21 or 9/11. He is OK with the 9/11 appt. Unsure who called patient regarding this appt.

## 2015-09-18 ENCOUNTER — Telehealth: Payer: Self-pay | Admitting: Hematology and Oncology

## 2015-09-18 NOTE — Telephone Encounter (Signed)
Spoke with patient this morning re new appointment for lab/fu 9/18 @ 1 pm. R/s from 8/21 per patient.

## 2015-10-14 ENCOUNTER — Other Ambulatory Visit (HOSPITAL_BASED_OUTPATIENT_CLINIC_OR_DEPARTMENT_OTHER): Payer: Medicare Other

## 2015-10-14 ENCOUNTER — Ambulatory Visit (HOSPITAL_BASED_OUTPATIENT_CLINIC_OR_DEPARTMENT_OTHER): Payer: Medicare Other | Admitting: Hematology and Oncology

## 2015-10-14 ENCOUNTER — Telehealth: Payer: Self-pay | Admitting: Hematology and Oncology

## 2015-10-14 ENCOUNTER — Encounter: Payer: Self-pay | Admitting: Hematology and Oncology

## 2015-10-14 VITALS — BP 125/48 | HR 57 | Temp 98.4°F | Resp 18 | Ht 68.5 in | Wt 193.0 lb

## 2015-10-14 DIAGNOSIS — R600 Localized edema: Secondary | ICD-10-CM

## 2015-10-14 DIAGNOSIS — C9110 Chronic lymphocytic leukemia of B-cell type not having achieved remission: Secondary | ICD-10-CM | POA: Diagnosis not present

## 2015-10-14 DIAGNOSIS — D630 Anemia in neoplastic disease: Secondary | ICD-10-CM | POA: Diagnosis not present

## 2015-10-14 LAB — CBC WITH DIFFERENTIAL/PLATELET
BASO%: 0.5 % (ref 0.0–2.0)
BASOS ABS: 0 10*3/uL (ref 0.0–0.1)
EOS%: 1.7 % (ref 0.0–7.0)
Eosinophils Absolute: 0.1 10*3/uL (ref 0.0–0.5)
HEMATOCRIT: 32.6 % — AB (ref 38.4–49.9)
HGB: 10.7 g/dL — ABNORMAL LOW (ref 13.0–17.1)
LYMPH#: 3.2 10*3/uL (ref 0.9–3.3)
LYMPH%: 50.9 % — AB (ref 14.0–49.0)
MCH: 27.3 pg (ref 27.2–33.4)
MCHC: 32.8 g/dL (ref 32.0–36.0)
MCV: 83.2 fL (ref 79.3–98.0)
MONO#: 0.4 10*3/uL (ref 0.1–0.9)
MONO%: 6.5 % (ref 0.0–14.0)
NEUT#: 2.6 10*3/uL (ref 1.5–6.5)
NEUT%: 40.4 % (ref 39.0–75.0)
PLATELETS: 158 10*3/uL (ref 140–400)
RBC: 3.92 10*6/uL — ABNORMAL LOW (ref 4.20–5.82)
RDW: 16 % — ABNORMAL HIGH (ref 11.0–14.6)
WBC: 6.4 10*3/uL (ref 4.0–10.3)

## 2015-10-14 LAB — COMPREHENSIVE METABOLIC PANEL
ALT: 10 U/L (ref 0–55)
ANION GAP: 7 meq/L (ref 3–11)
AST: 18 U/L (ref 5–34)
Albumin: 2.9 g/dL — ABNORMAL LOW (ref 3.5–5.0)
Alkaline Phosphatase: 51 U/L (ref 40–150)
BUN: 14.5 mg/dL (ref 7.0–26.0)
CALCIUM: 8.5 mg/dL (ref 8.4–10.4)
CHLORIDE: 106 meq/L (ref 98–109)
CO2: 28 mEq/L (ref 22–29)
CREATININE: 1 mg/dL (ref 0.7–1.3)
EGFR: 80 mL/min/{1.73_m2} — AB (ref >90)
Glucose: 128 mg/dl (ref 70–140)
POTASSIUM: 4.2 meq/L (ref 3.5–5.1)
Sodium: 142 mEq/L (ref 136–145)
Total Bilirubin: <0.3 mg/dL (ref 0.20–1.20)
Total Protein: 5.7 g/dL — ABNORMAL LOW (ref 6.4–8.3)

## 2015-10-14 MED ORDER — IBRUTINIB 140 MG PO CAPS: 280.0000 mg | ORAL_CAPSULE | Freq: Every day | ORAL | 6 refills | Status: DC

## 2015-10-14 MED FILL — *IMBRUVICA 140 MG 120CT: 140 | 30 days supply | Qty: 60 | Fill #0

## 2015-10-14 NOTE — Assessment & Plan Note (Signed)
This is likely anemia of chronic disease. The patient denies recent history of bleeding such as epistaxis, hematuria or hematochezia. He is asymptomatic from the anemia. We will observe for now.  

## 2015-10-14 NOTE — Progress Notes (Signed)
[facility] [place] OFFICE PROGRESS NOTE  Patient Care Team: Anda Kraft, MD as PCP - General (Endocrinology) Heath Lark, MD as Consulting Physician (Hematology and Oncology)  SUMMARY OF ONCOLOGIC HISTORY:   CLL (chronic lymphocytic leukemia) ([place])   04/03/2008 Pathology Results    Case #: [id number] FLow cytomtry confirmed CLL      10/11/2014 Tumor Marker    FISH analysis showed trisomy 12 abnormality      04/29/2015 -  Chemotherapy    He started taking Ibrutinib      04/29/2015 Imaging    Ct scan showed mild lymphadenopathy and splenomegaly      05/13/2015 Adverse Reaction    Due to neutropenia, dose of Ibrutinib is reduced to 280 mg daily       INTERVAL HISTORY: Please see below for problem oriented charting. He returns for follow-up. He feels well. He has chronic lower extremity edema, unchanged. Denies recent diarrhea. No increased recent bruising.   REVIEW OF SYSTEMS:   Constitutional: Denies fevers, chills or abnormal weight loss Eyes: Denies blurriness of vision Ears, nose, mouth, throat, and face: Denies mucositis or sore throat Respiratory: Denies cough, dyspnea or wheezes Cardiovascular: Denies palpitation, chest discomfort Gastrointestinal:  Denies nausea, heartburn or change in bowel habits Skin: Denies abnormal skin rashes Lymphatics: Denies new lymphadenopathy or easy bruising Neurological:Denies numbness, tingling or new weaknesses Behavioral/Psych: Mood is stable, no new changes  All other systems were reviewed with the patient and are negative.  I have reviewed the past medical history, past surgical history, social history and family history with the patient and they are unchanged from previous note.  ALLERGIES:  has No Known Allergies.  MEDICATIONS:  Current Outpatient Prescriptions  Medication Sig Dispense Refill  . aspirin 325 MG tablet Take 325 mg by mouth daily.      Marland Kitchen atropine-PHENObarbital-scopolamine-hyoscyamine (DONNATAL) 16.2 MG  tablet Take 1 tablet by mouth every 8 (eight) hours as needed.      Marland Kitchen glipiZIDE (GLUCOTROL) 10 MG tablet Take 10 mg by mouth 2 (two) times daily before a meal.     . ibrutinib (IMBRUVICA) 140 MG capsul Take 2 capsules (280 mg total) by mouth daily. 90 capsule 6  . insulin NPH-insulin regular (NOVOLIN 70/30) (70-30) 100 UNIT/ML injection Inject 35 Units into the skin 2 (two) times daily with a meal. Sliding scale    . metFORMIN (GLUCOPHAGE) 1000 MG tablet Take 1,000 mg by mouth 2 (two) times daily with a meal.     . ONE TOUCH ULTRA TEST test strip     . ramipril (ALTACE) 5 MG tablet Take 5 mg by mouth daily.    . Tamsulosin HCl (FLOMAX) 0.4 MG CAPS Take 0.4 mg by mouth daily.    Marland Kitchen torsemide (DEMADEX) 20 MG tablet Take 20 mg by mouth daily.       No current facility-administered medications for this visit.     PHYSICAL EXAMINATION: ECOG PERFORMANCE STATUS: 1 - Symptomatic but completely ambulatory  Vitals:   10/14/15 1305  BP: (!) 125/48  Pulse: (!) 57  Resp: 18  Temp: 98.4 F (36.9 C)   Filed Weights   10/14/15 1305  Weight: 193 lb (87.5 kg)    GENERAL:alert, no distress and comfortable SKIN: skin color, texture, turgor are normal, no rashes or significant lesions EYES: normal, Conjunctiva are pink and non-injected, sclera clear OROPHARYNX:no exudate, no erythema and lips, buccal mucosa, and tongue normal  NECK: supple, thyroid normal size, non-tender, without nodularity LYMPH:  no palpable  lymphadenopathy in the cervical, axillary or inguinal LUNGS: clear to auscultation and percussion with normal breathing effort HEART: regular rate & rhythm and no murmurs with moderate bilateral lower extremity edema ABDOMEN:abdomen soft, non-tender and normal bowel sounds Musculoskeletal:no cyanosis of digits and no clubbing  NEURO: alert & oriented x 3 with fluent speech, no focal motor/sensory deficits  LABORATORY DATA:  I have reviewed the data as listed    Component Value Date/Time    NA 142 10/14/2015 1239   K 4.2 10/14/2015 1239   CL 104 03/28/2012 1426   CO2 28 10/14/2015 1239   GLUCOSE 128 10/14/2015 1239   GLUCOSE 171 (H) 03/28/2012 1426   BUN 14.5 10/14/2015 1239   CREATININE 1.0 10/14/2015 1239   CALCIUM 8.5 10/14/2015 1239   PROT 5.7 (L) 10/14/2015 1239   ALBUMIN 2.9 (L) 10/14/2015 1239   AST 18 10/14/2015 1239   ALT 10 10/14/2015 1239   ALKPHOS 51 10/14/2015 1239   BILITOT <0.30 10/14/2015 1239   GFRNONAA 55 (L) 02/16/2007 0805   GFRAA  02/16/2007 0805    >60        The eGFR has been calculated using the MDRD equation. This calculation has not been validated in all clinical    No results found for: SPEP, UPEP  Lab Results  Component Value Date   WBC 6.4 10/14/2015   NEUTROABS 2.6 10/14/2015   HGB 10.7 (L) 10/14/2015   HCT 32.6 (L) 10/14/2015   MCV 83.2 10/14/2015   PLT 158 10/14/2015      Chemistry      Component Value Date/Time   NA 142 10/14/2015 1239   K 4.2 10/14/2015 1239   CL 104 03/28/2012 1426   CO2 28 10/14/2015 1239   BUN 14.5 10/14/2015 1239   CREATININE 1.0 10/14/2015 1239      Component Value Date/Time   CALCIUM 8.5 10/14/2015 1239   ALKPHOS 51 10/14/2015 1239   AST 18 10/14/2015 1239   ALT 10 10/14/2015 1239   BILITOT <0.30 10/14/2015 1239     ASSESSMENT & PLAN:  CLL (chronic lymphocytic leukemia) He tolerated treatment very well with excellent response to treatment with minimum side effects apart from fatigue and mild pancytopenia I have reduced dose of Ibrutinib to 280 mg daily and he tolerated better I recommend we continue to same and I plan to see him back in 3 months with repeat imaging study.  Anemia in neoplastic disease This is likely anemia of chronic disease. The patient denies recent history of bleeding such as epistaxis, hematuria or hematochezia. He is asymptomatic from the anemia. We will observe for now.   Bilateral leg edema Likely related to mild protein calorie malnutrition with mildly  reduced low albumin. Recommend observation only.   Orders Placed This Encounter  Procedures  . CT CHEST W CONTRAST    Standing Status:   Future    Standing Expiration Date:   11/17/2016    Order Specific Question:   Reason for exam:    Answer:   staging CLL, assess response to Rx    Order Specific Question:   Preferred imaging location?    Answer:   [facility]  . CT ABDOMEN PELVIS W CONTRAST    Standing Status:   Future    Standing Expiration Date:   11/17/2016    Order Specific Question:   Reason for exam:    Answer:   staging CLL, assess response to Rx    Order Specific Question:  Preferred imaging location?    Answer:   [facility]  . CBC with Differential/Platelet    Standing Status:   Future    Standing Expiration Date:   11/17/2016  . Comprehensive metabolic panel    Standing Status:   Future    Standing Expiration Date:   11/17/2016  . Lactate dehydrogenase    Standing Status:   Future    Standing Expiration Date:   11/17/2016   All questions were answered. The patient knows to call the clinic with any problems, questions or concerns. No barriers to learning was detected. I spent 15 minutes counseling the patient face to face. The total time spent in the appointment was 20 minutes and more than 50% was on counseling and review of test results     [facility], [place], MD 10/14/2015 1:31 PM

## 2015-10-14 NOTE — Telephone Encounter (Signed)
Avs report and appointment schedule given to patient, per 10/14/15 los. °

## 2015-10-14 NOTE — Assessment & Plan Note (Signed)
He tolerated treatment very well with excellent response to treatment with minimum side effects apart from fatigue and mild pancytopenia I have reduced dose of Ibrutinib to 280 mg daily and he tolerated better I recommend we continue to same and I plan to see him back in 3 months with repeat imaging study.

## 2015-10-14 NOTE — Assessment & Plan Note (Signed)
Likely related to mild protein calorie malnutrition with mildly reduced low albumin. Recommend observation only.

## 2015-11-13 ENCOUNTER — Other Ambulatory Visit: Payer: Self-pay | Admitting: *Deleted

## 2015-11-13 MED FILL — IMBRUVICA 140 MG CAPSULE: 140 | 30 days supply | Qty: 60 | Fill #1

## 2015-12-13 ENCOUNTER — Telehealth: Payer: Self-pay | Admitting: *Deleted

## 2015-12-13 MED FILL — IMBRUVICA 140 MG CAPSULE: 140 | 30 days supply | Qty: 60 | Fill #2

## 2015-12-13 NOTE — Telephone Encounter (Signed)
Pt left VM this morning stating he needed a refill on a "medication" sent to his "pharmacy."   Called pt back and s/w his wife who is unsure what Rx he needs.  Called his cell phone and left detailed message informing him that there are refills left on Ibrutinib.   Instructed to call Pharmacy on the [facility] [place]) to request refill.  Please call our office back if he was referring to another medication or has any problems getting Ibrutinib refilled.

## 2016-01-08 ENCOUNTER — Other Ambulatory Visit (HOSPITAL_BASED_OUTPATIENT_CLINIC_OR_DEPARTMENT_OTHER): Payer: Medicare Other

## 2016-01-08 ENCOUNTER — Encounter (HOSPITAL_COMMUNITY): Payer: Self-pay

## 2016-01-08 ENCOUNTER — Ambulatory Visit (HOSPITAL_COMMUNITY)
Admission: RE | Admit: 2016-01-08 | Discharge: 2016-01-08 | Disposition: A | Discharge status: Home / Self Care | Payer: Medicare Other | Source: Ambulatory Visit | Attending: Hematology and Oncology | Admitting: Hematology and Oncology

## 2016-01-08 DIAGNOSIS — N433 Hydrocele, unspecified: Secondary | ICD-10-CM | POA: Insufficient documentation

## 2016-01-08 DIAGNOSIS — D630 Anemia in neoplastic disease: Secondary | ICD-10-CM

## 2016-01-08 DIAGNOSIS — C9110 Chronic lymphocytic leukemia of B-cell type not having achieved remission: Secondary | ICD-10-CM | POA: Diagnosis not present

## 2016-01-08 DIAGNOSIS — K4090 Unilateral inguinal hernia, without obstruction or gangrene, not specified as recurrent: Secondary | ICD-10-CM | POA: Insufficient documentation

## 2016-01-08 DIAGNOSIS — I700 Atherosclerosis of aorta: Secondary | ICD-10-CM | POA: Diagnosis not present

## 2016-01-08 LAB — COMPREHENSIVE METABOLIC PANEL
ALBUMIN: 3.2 g/dL — AB (ref 3.5–5.0)
ALK PHOS: 57 U/L (ref 40–150)
ALT: 12 U/L (ref 0–55)
ANION GAP: 7 meq/L (ref 3–11)
AST: 20 U/L (ref 5–34)
BUN: 19.7 mg/dL (ref 7.0–26.0)
CALCIUM: 9.5 mg/dL (ref 8.4–10.4)
CO2: 30 mEq/L — ABNORMAL HIGH (ref 22–29)
Chloride: 104 mEq/L (ref 98–109)
Creatinine: 1 mg/dL (ref 0.7–1.3)
EGFR: 81 mL/min/{1.73_m2} — AB (ref >90)
Glucose: 92 mg/dl (ref 70–140)
Potassium: 5 mEq/L (ref 3.5–5.1)
Sodium: 141 mEq/L (ref 136–145)
TOTAL PROTEIN: 6.3 g/dL — AB (ref 6.4–8.3)

## 2016-01-08 LAB — CBC WITH DIFFERENTIAL/PLATELET
BASO%: 1 % (ref 0.0–2.0)
Basophils Absolute: 0.1 10*3/uL (ref 0.0–0.1)
EOS ABS: 0.1 10*3/uL (ref 0.0–0.5)
EOS%: 1.3 % (ref 0.0–7.0)
HCT: 34.7 % — ABNORMAL LOW (ref 38.4–49.9)
HEMOGLOBIN: 10.9 g/dL — AB (ref 13.0–17.1)
LYMPH%: 43.1 % (ref 14.0–49.0)
MCH: 26.3 pg — ABNORMAL LOW (ref 27.2–33.4)
MCHC: 31.3 g/dL — ABNORMAL LOW (ref 32.0–36.0)
MCV: 83.8 fL (ref 79.3–98.0)
MONO#: 0.4 10*3/uL (ref 0.1–0.9)
MONO%: 6.7 % (ref 0.0–14.0)
NEUT%: 47.9 % (ref 39.0–75.0)
NEUTROS ABS: 2.9 10*3/uL (ref 1.5–6.5)
PLATELETS: 192 10*3/uL (ref 140–400)
RBC: 4.14 10*6/uL — ABNORMAL LOW (ref 4.20–5.82)
RDW: 15.1 % — AB (ref 11.0–14.6)
WBC: 6.1 10*3/uL (ref 4.0–10.3)
lymph#: 2.6 10*3/uL (ref 0.9–3.3)

## 2016-01-08 LAB — LACTATE DEHYDROGENASE: LDH: 237 U/L (ref 125–245)

## 2016-01-08 MED ORDER — SODIUM CHLORIDE 0.9 % IJ SOLN
INTRAMUSCULAR | Status: AC
  Filled 2016-01-08: qty 50

## 2016-01-08 MED ORDER — IOPAMIDOL (ISOVUE-300) INJECTION 61%
INTRAVENOUS | Status: AC
  Administered 2016-01-08: 100 mL
  Filled 2016-01-08: qty 100

## 2016-01-10 ENCOUNTER — Encounter: Payer: Self-pay | Admitting: Hematology and Oncology

## 2016-01-10 ENCOUNTER — Ambulatory Visit (HOSPITAL_BASED_OUTPATIENT_CLINIC_OR_DEPARTMENT_OTHER): Payer: Medicare Other | Admitting: Hematology and Oncology

## 2016-01-10 ENCOUNTER — Telehealth: Payer: Self-pay | Admitting: Hematology and Oncology

## 2016-01-10 DIAGNOSIS — D630 Anemia in neoplastic disease: Secondary | ICD-10-CM

## 2016-01-10 DIAGNOSIS — E441 Mild protein-calorie malnutrition: Secondary | ICD-10-CM | POA: Insufficient documentation

## 2016-01-10 DIAGNOSIS — R600 Localized edema: Secondary | ICD-10-CM | POA: Diagnosis not present

## 2016-01-10 DIAGNOSIS — C9110 Chronic lymphocytic leukemia of B-cell type not having achieved remission: Secondary | ICD-10-CM

## 2016-01-10 NOTE — Assessment & Plan Note (Signed)
Likely related to mild protein calorie malnutrition with mildly reduced low albumin. Recommend observation only. He is on low dose diuretuic

## 2016-01-10 NOTE — Assessment & Plan Note (Signed)
This is likely anemia of chronic disease. The patient denies recent history of bleeding such as epistaxis, hematuria or hematochezia. He is asymptomatic from the anemia. We will observe for now.  

## 2016-01-10 NOTE — Telephone Encounter (Signed)
Gave patient avs report and appointments for March  °

## 2016-01-10 NOTE — Progress Notes (Signed)
[facility] OFFICE PROGRESS NOTE  Patient Care Team: Anda Kraft, MD as PCP - General (Endocrinology) Heath Lark, MD as Consulting Physician (Hematology and Oncology)  SUMMARY OF ONCOLOGIC HISTORY:   CLL (chronic lymphocytic leukemia) ([place])   04/03/2008 Pathology Results    Case #: [id number] FLow cytomtry confirmed CLL      10/11/2014 Tumor Marker    FISH analysis showed trisomy 12 abnormality      04/29/2015 -  Chemotherapy    He started taking Ibrutinib      04/29/2015 Imaging    Ct scan showed mild lymphadenopathy and splenomegaly      05/13/2015 Adverse Reaction    Due to neutropenia, dose of Ibrutinib is reduced to 280 mg daily      01/08/2016 Imaging    Response to therapy. Resolution of thoracic and abdominal adenopathy. Resolution of splenomegaly. 2. No new or progressive disease. 3. Small bowel containing left inguinal hernia and right-sided hydrocele, as before. 4. Esophageal air fluid level suggests dysmotility or gastroesophageal reflux. 5. Possible constipation. 6. Suspect hepatic hemangiomas, similar.       INTERVAL HISTORY: Please see below for problem oriented charting. He feels well. He complained of mild fatigue. He has chronic bilateral lower extremity edema. Denies recent infection. The patient denies any recent signs or symptoms of bleeding such as spontaneous epistaxis, hematuria or hematochezia. Denies bruising or irregular heartbeat. No diarrhea  REVIEW OF SYSTEMS:   Constitutional: Denies fevers, chills or abnormal weight loss Eyes: Denies blurriness of vision Ears, nose, mouth, throat, and face: Denies mucositis or sore throat Respiratory: Denies cough, dyspnea or wheezes Cardiovascular: Denies palpitation, chest discomfort  Gastrointestinal:  Denies nausea, heartburn or change in bowel habits Skin: Denies abnormal skin rashes Lymphatics: Denies new lymphadenopathy or easy bruising Neurological:Denies numbness, tingling or new  weaknesses Behavioral/Psych: Mood is stable, no new changes  All other systems were reviewed with the patient and are negative.  I have reviewed the past medical history, past surgical history, social history and family history with the patient and they are unchanged from previous note.  ALLERGIES:  has No Known Allergies.  MEDICATIONS:  Current Outpatient Prescriptions  Medication Sig Dispense Refill  . aspirin 325 MG tablet Take 325 mg by mouth daily.      Marland Kitchen atropine-PHENObarbital-scopolamine-hyoscyamine (DONNATAL) 16.2 MG tablet Take 1 tablet by mouth every 8 (eight) hours as needed.      Marland Kitchen glipiZIDE (GLUCOTROL) 10 MG tablet Take 10 mg by mouth 2 (two) times daily before a meal.     . ibrutinib (IMBRUVICA) 140 MG capsul Take 2 capsules (280 mg total) by mouth daily. 90 capsule 6  . insulin NPH-insulin regular (NOVOLIN 70/30) (70-30) 100 UNIT/ML injection Inject 35 Units into the skin 2 (two) times daily with a meal. Sliding scale    . metFORMIN (GLUCOPHAGE) 1000 MG tablet Take 1,000 mg by mouth 2 (two) times daily with a meal.     . ONE TOUCH ULTRA TEST test strip     . ramipril (ALTACE) 5 MG tablet Take 5 mg by mouth daily.    . Tamsulosin HCl (FLOMAX) 0.4 MG CAPS Take 0.4 mg by mouth daily.    Marland Kitchen torsemide (DEMADEX) 20 MG tablet Take 20 mg by mouth daily.       No current facility-administered medications for this visit.     PHYSICAL EXAMINATION: ECOG PERFORMANCE STATUS: 1 - Symptomatic but completely ambulatory  Vitals:   01/10/16 1327  BP: (!) 127/44  Pulse: 77  Resp: 18  Temp: 97.8 F (36.6 C)   Filed Weights   01/10/16 1327  Weight: 195 lb (88.5 kg)    GENERAL:alert, no distress and comfortable SKIN: skin color, texture, turgor are normal, no rashes or significant lesions EYES: normal, Conjunctiva are pink and non-injected, sclera clear HEART: regular rate & rhythm and no murmurs with moderate lower extremity edema ABDOMEN:abdomen soft, non-tender and normal bowel  sounds Musculoskeletal:no cyanosis of digits and no clubbing  NEURO: alert & oriented x 3 with fluent speech, no focal motor/sensory deficits  LABORATORY DATA:  I have reviewed the data as listed    Component Value Date/Time   NA 141 01/08/2016 1230   K 5.0 01/08/2016 1230   CL 104 03/28/2012 1426   CO2 30 (H) 01/08/2016 1230   GLUCOSE 92 01/08/2016 1230   GLUCOSE 171 (H) 03/28/2012 1426   BUN 19.7 01/08/2016 1230   CREATININE 1.0 01/08/2016 1230   CALCIUM 9.5 01/08/2016 1230   PROT 6.3 (L) 01/08/2016 1230   ALBUMIN 3.2 (L) 01/08/2016 1230   AST 20 01/08/2016 1230   ALT 12 01/08/2016 1230   ALKPHOS 57 01/08/2016 1230   BILITOT <0.22 01/08/2016 1230   GFRNONAA 55 (L) 02/16/2007 0805   GFRAA  02/16/2007 0805    >60        The eGFR has been calculated using the MDRD equation. This calculation has not been validated in all clinical    No results found for: SPEP, UPEP  Lab Results  Component Value Date   WBC 6.1 01/08/2016   NEUTROABS 2.9 01/08/2016   HGB 10.9 (L) 01/08/2016   HCT 34.7 (L) 01/08/2016   MCV 83.8 01/08/2016   PLT 192 01/08/2016      Chemistry      Component Value Date/Time   NA 141 01/08/2016 1230   K 5.0 01/08/2016 1230   CL 104 03/28/2012 1426   CO2 30 (H) 01/08/2016 1230   BUN 19.7 01/08/2016 1230   CREATININE 1.0 01/08/2016 1230      Component Value Date/Time   CALCIUM 9.5 01/08/2016 1230   ALKPHOS 57 01/08/2016 1230   AST 20 01/08/2016 1230   ALT 12 01/08/2016 1230   BILITOT <0.22 01/08/2016 1230       RADIOGRAPHIC STUDIES: I have personally reviewed the radiological images as listed and agreed with the findings in the report. Ct Chest W Contrast  Result Date: 01/08/2016 CLINICAL DATA:  Chronic lymphocytic leukemia. Restaging. Diagnosed multiple times, including 2017. Diabetes. EXAM: CT CHEST, ABDOMEN, AND PELVIS WITH CONTRAST TECHNIQUE: Multidetector CT imaging of the chest, abdomen and pelvis was performed following the standard  protocol during bolus administration of intravenous contrast. CONTRAST:  1 ISOVUE-300 IOPAMIDOL (ISOVUE-300) INJECTION 61% COMPARISON:  04/29/2015 FINDINGS: CT CHEST FINDINGS Cardiovascular: Bovine arch. Normal heart size, without pericardial effusion. No central pulmonary embolism, on this non-dedicated study. Mediastinum/Nodes: No axillary adenopathy. No mediastinal adenopathy. Borderline right hilar adenopathy has resolved. Contrast level in the esophagus on image 23/series 2. Lungs/Pleura: No pleural fluid.  Minimal motion degradation. Possibly calcified 4 mm superior segment left lower lobe pulmonary nodule is unchanged and favored to be benign. Musculoskeletal: No acute osseous abnormality. CT ABDOMEN PELVIS FINDINGS Hepatobiliary: Suspect a perfusion anomaly or flash fill hemangioma within the hepatic dome at 7 mm on image 48/series 2. Likely present on the prior. A right hepatic lobe vague hypoattenuating lesion is similar at 10 mm and has a suggestion of peripheral nodular enhancement. Normal gallbladder, without  biliary ductal dilatation. Pancreas: Mildly prominent pancreatic duct is unchanged in the region of the head. No acute pancreatitis or pancreatic mass. Spleen: 10.2 cm craniocaudal splenic dimension, normal. Compare 16.3 cm previously. Adrenals/Urinary Tract: Normal adrenal glands. Mild renal cortical thinning bilaterally. Too small to characterize lesions in both kidneys. No hydronephrosis. Normal urinary bladder. Stomach/Bowel: Normal stomach, without wall thickening. Colonic stool burden suggests constipation. Normal terminal ileum. Nonobstructive small bowel positioned within all large left inguinal hernia, similar. Periampullary duodenal diverticulum. Vascular/Lymphatic: Multiple left renal arteries. Aortic and branch vessel atherosclerosis. Resolution of portal caval and celiac adenopathy. No pelvic sidewall adenopathy. Reproductive: Normal prostate.  A moderate right hydrocele. Other: No  significant free fluid. Musculoskeletal: Disc bulge at L4-5 and less so L5-S1. IMPRESSION: 1. Response to therapy. Resolution of thoracic and abdominal adenopathy. Resolution of splenomegaly. 2. No new or progressive disease. 3. Small bowel containing left inguinal hernia and right-sided hydrocele, as before. 4. Esophageal air fluid level suggests dysmotility or gastroesophageal reflux. 5.  Possible constipation. 6. Suspect hepatic hemangiomas, similar. Electronically Signed   By: Abigail Miyamoto M.D.   On: 01/08/2016 16:21   Ct Abdomen Pelvis W Contrast  Result Date: 01/08/2016 CLINICAL DATA:  Chronic lymphocytic leukemia. Restaging. Diagnosed multiple times, including 2017. Diabetes. EXAM: CT CHEST, ABDOMEN, AND PELVIS WITH CONTRAST TECHNIQUE: Multidetector CT imaging of the chest, abdomen and pelvis was performed following the standard protocol during bolus administration of intravenous contrast. CONTRAST:  1 ISOVUE-300 IOPAMIDOL (ISOVUE-300) INJECTION 61% COMPARISON:  04/29/2015 FINDINGS: CT CHEST FINDINGS Cardiovascular: Bovine arch. Normal heart size, without pericardial effusion. No central pulmonary embolism, on this non-dedicated study. Mediastinum/Nodes: No axillary adenopathy. No mediastinal adenopathy. Borderline right hilar adenopathy has resolved. Contrast level in the esophagus on image 23/series 2. Lungs/Pleura: No pleural fluid.  Minimal motion degradation. Possibly calcified 4 mm superior segment left lower lobe pulmonary nodule is unchanged and favored to be benign. Musculoskeletal: No acute osseous abnormality. CT ABDOMEN PELVIS FINDINGS Hepatobiliary: Suspect a perfusion anomaly or flash fill hemangioma within the hepatic dome at 7 mm on image 48/series 2. Likely present on the prior. A right hepatic lobe vague hypoattenuating lesion is similar at 10 mm and has a suggestion of peripheral nodular enhancement. Normal gallbladder, without biliary ductal dilatation. Pancreas: Mildly prominent  pancreatic duct is unchanged in the region of the head. No acute pancreatitis or pancreatic mass. Spleen: 10.2 cm craniocaudal splenic dimension, normal. Compare 16.3 cm previously. Adrenals/Urinary Tract: Normal adrenal glands. Mild renal cortical thinning bilaterally. Too small to characterize lesions in both kidneys. No hydronephrosis. Normal urinary bladder. Stomach/Bowel: Normal stomach, without wall thickening. Colonic stool burden suggests constipation. Normal terminal ileum. Nonobstructive small bowel positioned within all large left inguinal hernia, similar. Periampullary duodenal diverticulum. Vascular/Lymphatic: Multiple left renal arteries. Aortic and branch vessel atherosclerosis. Resolution of portal caval and celiac adenopathy. No pelvic sidewall adenopathy. Reproductive: Normal prostate.  A moderate right hydrocele. Other: No significant free fluid. Musculoskeletal: Disc bulge at L4-5 and less so L5-S1. IMPRESSION: 1. Response to therapy. Resolution of thoracic and abdominal adenopathy. Resolution of splenomegaly. 2. No new or progressive disease. 3. Small bowel containing left inguinal hernia and right-sided hydrocele, as before. 4. Esophageal air fluid level suggests dysmotility or gastroesophageal reflux. 5.  Possible constipation. 6. Suspect hepatic hemangiomas, similar. Electronically Signed   By: Abigail Miyamoto M.D.   On: 01/08/2016 16:21    ASSESSMENT & PLAN:  CLL (chronic lymphocytic leukemia) He tolerated treatment very well with excellent response to treatment with minimum  side effects apart from fatigue and mild pancytopenia I have reduced dose of Ibrutinib to 280 mg daily and he tolerated better I recommend we continue to same and I plan to see him back in 3 months with blood work, history and physical examination  Anemia in neoplastic disease This is likely anemia of chronic disease. The patient denies recent history of bleeding such as epistaxis, hematuria or hematochezia. He is  asymptomatic from the anemia. We will observe for now.   Protein-calorie malnutrition, mild ([place]) He has mild protein calorie malnutrition Overall, this is likely due to his disease His imaging study showed excellent response to treatment. I recommend the patient to increase oral intake as tolerated  Bilateral leg edema Likely related to mild protein calorie malnutrition with mildly reduced low albumin. Recommend observation only. He is on low dose diuretuic   No orders of the defined types were placed in this encounter.  All questions were answered. The patient knows to call the clinic with any problems, questions or concerns. No barriers to learning was detected. I spent 20 minutes counseling the patient face to face. The total time spent in the appointment was 25 minutes and more than 50% was on counseling and review of test results     Heath Lark, MD 01/10/2016 1:40 PM

## 2016-01-10 NOTE — Assessment & Plan Note (Signed)
He has mild protein calorie malnutrition Overall, this is likely due to his disease His imaging study showed excellent response to treatment. I recommend the patient to increase oral intake as tolerated

## 2016-01-10 NOTE — Assessment & Plan Note (Signed)
He tolerated treatment very well with excellent response to treatment with minimum side effects apart from fatigue and mild pancytopenia I have reduced dose of Ibrutinib to 280 mg daily and he tolerated better I recommend we continue to same and I plan to see him back in 3 months with blood work, history and physical examination

## 2016-01-14 ENCOUNTER — Telehealth: Payer: Self-pay | Admitting: Hematology and Oncology

## 2016-01-14 MED FILL — IMBRUVICA 140 MG CAPSULE: 140 | 30 days supply | Qty: 60 | Fill #3

## 2016-01-14 NOTE — Telephone Encounter (Signed)
Mailed records to [facility]

## 2016-02-11 DIAGNOSIS — R69 Illness, unspecified: Secondary | ICD-10-CM | POA: Diagnosis not present

## 2016-02-13 MED FILL — IMBRUVICA 140 MG CAPSULE: 140 | 30 days supply | Qty: 60 | Fill #4

## 2016-03-04 ENCOUNTER — Emergency Department (HOSPITAL_COMMUNITY)
Admission: EM | Admit: 2016-03-04 | Discharge: 2016-03-04 | Disposition: A | Discharge status: Home / Self Care | Payer: Medicare HMO | Attending: Emergency Medicine | Admitting: Emergency Medicine

## 2016-03-04 ENCOUNTER — Emergency Department (HOSPITAL_COMMUNITY): Payer: Medicare HMO

## 2016-03-04 ENCOUNTER — Encounter (HOSPITAL_COMMUNITY): Payer: Self-pay | Admitting: Emergency Medicine

## 2016-03-04 DIAGNOSIS — E11649 Type 2 diabetes mellitus with hypoglycemia without coma: Secondary | ICD-10-CM | POA: Diagnosis not present

## 2016-03-04 DIAGNOSIS — Z794 Long term (current) use of insulin: Secondary | ICD-10-CM | POA: Insufficient documentation

## 2016-03-04 DIAGNOSIS — Y939 Activity, unspecified: Secondary | ICD-10-CM | POA: Diagnosis not present

## 2016-03-04 DIAGNOSIS — W228XXA Striking against or struck by other objects, initial encounter: Secondary | ICD-10-CM | POA: Insufficient documentation

## 2016-03-04 DIAGNOSIS — S00412A Abrasion of left ear, initial encounter: Secondary | ICD-10-CM | POA: Diagnosis not present

## 2016-03-04 DIAGNOSIS — S4992XA Unspecified injury of left shoulder and upper arm, initial encounter: Secondary | ICD-10-CM | POA: Diagnosis not present

## 2016-03-04 DIAGNOSIS — Z7982 Long term (current) use of aspirin: Secondary | ICD-10-CM | POA: Insufficient documentation

## 2016-03-04 DIAGNOSIS — S0990XA Unspecified injury of head, initial encounter: Secondary | ICD-10-CM | POA: Diagnosis not present

## 2016-03-04 DIAGNOSIS — S0991XA Unspecified injury of ear, initial encounter: Secondary | ICD-10-CM | POA: Diagnosis present

## 2016-03-04 DIAGNOSIS — S199XXA Unspecified injury of neck, initial encounter: Secondary | ICD-10-CM | POA: Diagnosis not present

## 2016-03-04 DIAGNOSIS — Y92002 Bathroom of unspecified non-institutional (private) residence single-family (private) house as the place of occurrence of the external cause: Secondary | ICD-10-CM | POA: Diagnosis not present

## 2016-03-04 DIAGNOSIS — M25512 Pain in left shoulder: Secondary | ICD-10-CM | POA: Diagnosis not present

## 2016-03-04 DIAGNOSIS — W19XXXA Unspecified fall, initial encounter: Secondary | ICD-10-CM

## 2016-03-04 DIAGNOSIS — Y999 Unspecified external cause status: Secondary | ICD-10-CM | POA: Insufficient documentation

## 2016-03-04 DIAGNOSIS — R42 Dizziness and giddiness: Secondary | ICD-10-CM | POA: Diagnosis not present

## 2016-03-04 DIAGNOSIS — R404 Transient alteration of awareness: Secondary | ICD-10-CM | POA: Diagnosis not present

## 2016-03-04 DIAGNOSIS — S299XXA Unspecified injury of thorax, initial encounter: Secondary | ICD-10-CM | POA: Diagnosis not present

## 2016-03-04 DIAGNOSIS — Z79899 Other long term (current) drug therapy: Secondary | ICD-10-CM | POA: Diagnosis not present

## 2016-03-04 DIAGNOSIS — E162 Hypoglycemia, unspecified: Secondary | ICD-10-CM

## 2016-03-04 LAB — CBC WITH DIFFERENTIAL/PLATELET
BASOS PCT: 0 %
Basophils Absolute: 0 10*3/uL (ref 0.0–0.1)
EOS ABS: 0 10*3/uL (ref 0.0–0.7)
Eosinophils Relative: 0 %
HCT: 35.1 % — ABNORMAL LOW (ref 39.0–52.0)
Hemoglobin: 11.3 g/dL — ABNORMAL LOW (ref 13.0–17.0)
Lymphocytes Relative: 14 %
Lymphs Abs: 1.2 10*3/uL (ref 0.7–4.0)
MCH: 26.4 pg (ref 26.0–34.0)
MCHC: 32.2 g/dL (ref 30.0–36.0)
MCV: 82 fL (ref 78.0–100.0)
MONO ABS: 0.4 10*3/uL (ref 0.1–1.0)
Monocytes Relative: 5 %
NEUTROS PCT: 81 %
Neutro Abs: 7 10*3/uL (ref 1.7–7.7)
PLATELETS: 172 10*3/uL (ref 150–400)
RBC: 4.28 MIL/uL (ref 4.22–5.81)
RDW: 15.2 % (ref 11.5–15.5)
WBC: 8.7 10*3/uL (ref 4.0–10.5)

## 2016-03-04 LAB — COMPREHENSIVE METABOLIC PANEL
ALBUMIN: 3.7 g/dL (ref 3.5–5.0)
ALT: 13 U/L — ABNORMAL LOW (ref 17–63)
ANION GAP: 9 (ref 5–15)
AST: 29 U/L (ref 15–41)
Alkaline Phosphatase: 45 U/L (ref 38–126)
BUN: 22 mg/dL — ABNORMAL HIGH (ref 6–20)
CO2: 27 mmol/L (ref 22–32)
Calcium: 9.2 mg/dL (ref 8.9–10.3)
Chloride: 102 mmol/L (ref 101–111)
Creatinine, Ser: 1.04 mg/dL (ref 0.61–1.24)
GFR calc Af Amer: >60 mL/min (ref >60)
GFR calc non Af Amer: >60 mL/min (ref >60)
GLUCOSE: 112 mg/dL — AB (ref 65–99)
POTASSIUM: 4.1 mmol/L (ref 3.5–5.1)
SODIUM: 138 mmol/L (ref 135–145)
Total Bilirubin: 0.5 mg/dL (ref 0.3–1.2)
Total Protein: 6.3 g/dL — ABNORMAL LOW (ref 6.5–8.1)

## 2016-03-04 LAB — TROPONIN I: Troponin I: 0.05 ng/mL (ref <0.03)

## 2016-03-04 LAB — CBG MONITORING, ED: Glucose-Capillary: 85 mg/dL (ref 65–99)

## 2016-03-04 MED ORDER — SODIUM CHLORIDE 0.9 % IV SOLN
INTRAVENOUS | Status: DC
  Administered 2016-03-04: 12:00:00 via INTRAVENOUS

## 2016-03-04 NOTE — ED Notes (Signed)
Bed: WA02 Expected date:  Expected time:  Means of arrival:  Comments: EMS- 83yo M, hypoglycemia/fall/head injury

## 2016-03-04 NOTE — ED Notes (Signed)
RN notified of troponin of 0.05

## 2016-03-04 NOTE — ED Notes (Signed)
Patient transported to CT 

## 2016-03-04 NOTE — ED Triage Notes (Signed)
Per EMS, patient's sugar 87 this morning at 6:30 and took insulin. Patient fell in bathroom shortly after. Patient hit head on sink Hematoma behind left ear; laceration to left ear, hematoma to right back of head. Patient received 25 grams of d10. Patient is now alert and oriented. Patient has also ate a peanut butter sandwich and 2 cups of orange juice. Patient is complaining of left head, left shoulder, left leg pain. Denies blood thinner use. Patient is from home.

## 2016-03-04 NOTE — Discharge Instructions (Signed)
As discussed, your evaluation today has been largely reassuring.  But, it is important that you monitor your condition carefully, and do not hesitate to return to the ED if you develop new, or concerning changes in your condition. ? ?Otherwise, please follow-up with your physician for appropriate ongoing care. ? ?

## 2016-03-04 NOTE — ED Notes (Signed)
ED Provider at bedside. 

## 2016-03-04 NOTE — ED Provider Notes (Signed)
[facility] DEPT Provider Note   CSN: [id number] Arrival date & time: 03/04/16  1058     History   Chief Complaint Chief Complaint  Patient presents with  . Hypoglycemia  . Fall    HPI Michael [name] is a 83 y.o. male.  HPI  Patient presents via EMS after an episode of lightheadedness and fall. Currently the patient is awake, alert, providing details of his history. He has minor discomfort about the left superior ear, no focal pain. He acknowledges feeling lightheaded, while standing up, and the bathroom, starting his day, after receiving insulin, blood prior to eating breakfast. Patient recalls awakening on the floor, in the bathroom, with pain in his left superior auricular area. EMS reports the patient was hypoglycemic on arrival, received D50, and his last Accu-Chek was 89.  Patient states that he has been in his usual state of health, including taking all medication as directed. He acknowledges a history of insulin diabetes, as well as leukemia for which he takes oral chemotherapeutic agents.    Past Medical History:  Diagnosis Date  . Arthritis   . cll dx'd 2000   no rx  . CLL (chronic lymphoblastic leukemia)   . Diabetes mellitus   . Leukemia, chronic lymphoid ([place]) 12/08/2010  . Lymphocytosis     Patient Active Problem List   Diagnosis Date Noted  . Protein-calorie malnutrition, mild ([place]) 01/10/2016  . Diarrhea due to drug 06/17/2015  . Chronic left shoulder pain 06/17/2015  . Bilateral leg edema 06/17/2015  . Pancytopenia, acquired ([place]) 04/22/2015  . Thrombocytopenia ([place]) 10/11/2014  . 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 CLL (chronic lymphocytic leukemia) ([place]) 12/08/2010    Past Surgical History:  Procedure Laterality Date  . CATARACT EXTRACTION         Home Medications    Prior to Admission medications   Medication Sig Start Date End Date Taking? Authorizing Provider  aspirin 325 MG tablet Take 325 mg by mouth daily.       Historical Provider, MD  atropine-PHENObarbital-scopolamine-hyoscyamine (DONNATAL) 16.2 MG tablet Take 1 tablet by mouth every 8 (eight) hours as needed.      Historical Provider, MD  glipiZIDE (GLUCOTROL) 10 MG tablet Take 10 mg by mouth 2 (two) times daily before a meal.     Historical Provider, MD  ibrutinib (IMBRUVICA) 140 MG capsul Take 2 capsules (280 mg total) by mouth daily. 10/14/15   Heath Lark, MD  insulin NPH-insulin regular (NOVOLIN 70/30) (70-30) 100 UNIT/ML injection Inject 35 Units into the skin 2 (two) times daily with a meal. Sliding scale    Historical Provider, MD  metFORMIN (GLUCOPHAGE) 1000 MG tablet Take 1,000 mg by mouth 2 (two) times daily with a meal.     Historical Provider, MD  ONE TOUCH ULTRA TEST test strip  05/16/13   Historical Provider, MD  ramipril (ALTACE) 5 MG tablet Take 5 mg by mouth daily.    Historical Provider, MD  Tamsulosin HCl (FLOMAX) 0.4 MG CAPS Take 0.4 mg by mouth daily.    Historical Provider, MD  torsemide (DEMADEX) 20 MG tablet Take 20 mg by mouth daily.      Historical Provider, MD    Family History Family History  Problem Relation Age of Onset  . Cancer Mother   . Hypertension Father   . Diabetes Father     Social History Social History  Substance Use Topics  . Smoking status: Never Smoker  . Smokeless tobacco: Former Systems developer    Types:  Snuff  . Alcohol use No     Allergies   Patient has no known allergies.   Review of Systems Review of Systems  Constitutional:       Per HPI, otherwise negative  HENT:       Per HPI, otherwise negative  Respiratory:       Per HPI, otherwise negative  Cardiovascular:       Per HPI, otherwise negative  Gastrointestinal: Negative for vomiting.  Endocrine:       Negative aside from HPI  Genitourinary:       Neg aside from HPI   Musculoskeletal:       Per HPI, otherwise negative  Skin: Positive for wound.  Allergic/Immunologic: Positive for immunocompromised state.  Neurological: Positive  for syncope.     Physical Exam Updated Vital Signs BP (!) 127/52   Pulse 72   Temp 97.3 F (36.3 C) (Oral)   Resp 19   Ht 5\' 8"  (1.727 m)   Wt 195 lb (88.5 kg)   SpO2 100%   BMI 29.65 kg/m   Physical Exam  Constitutional: He is oriented to person, place, and time. He appears well-developed. No distress.  HENT:  Head: Normocephalic.    Eyes: Conjunctivae and EOM are normal.  Neck:  Tenderness throughout the mid cervical spine central, and paraspinal right and left no change in range of motion, no gross deformity  Cardiovascular: Normal rate and regular rhythm.   Pulmonary/Chest: Effort normal. No stridor. No respiratory distress.  Abdominal: He exhibits no distension.  Musculoskeletal: He exhibits no edema.  Neurological: He is alert and oriented to person, place, and time. He displays no atrophy and no tremor. No cranial nerve deficit. He exhibits normal muscle tone.  Skin: Skin is warm and dry.  Psychiatric: He has a normal mood and affect.  Nursing note and vitals reviewed.    ED Treatments / Results  Labs (all labs ordered are listed, but only abnormal results are displayed) Labs Reviewed  CBC WITH DIFFERENTIAL/PLATELET - Abnormal; Notable for the following:       Result Value   Hemoglobin 11.3 (*)    HCT 35.1 (*)    All other components within normal limits  COMPREHENSIVE METABOLIC PANEL - Abnormal; Notable for the following:    Glucose, Bld 112 (*)    BUN 22 (*)    Total Protein 6.3 (*)    ALT 13 (*)    All other components within normal limits  TROPONIN I - Abnormal; Notable for the following:    Troponin I 0.05 (*)    All other components within normal limits  CBG MONITORING, ED  POCT CBG (FASTING - GLUCOSE)-MANUAL ENTRY    EKG  EKG Interpretation  Date/Time:  Wednesday March 04 2016 11:06:11 EST Ventricular Rate:  80 PR Interval:    QRS Duration: 95 QT Interval:  379 QTC Calculation: 438 R Axis:   66 Text Interpretation:  Sinus rhythm  ST-t wave abnormality Artifact Abnormal ekg Confirmed by Carmin Muskrat  MD [phone]) on 03/04/2016 11:12:27 AM       Radiology Dg Chest 2 View  Result Date: 03/04/2016 CLINICAL DATA:  Status post fall in the bathroom with a blow to the head. EXAM: CHEST  2 VIEW COMPARISON:  None. FINDINGS: The heart size and mediastinal contours are within normal limits. Both lungs are clear. The visualized skeletal structures are unremarkable. IMPRESSION: No acute disease. Electronically Signed   By: Inge Rise M.D.   On: 03/04/2016  12:34   Ct Head Wo Contrast  Result Date: 03/04/2016 CLINICAL DATA:  Fall.  Low blood sugar. EXAM: CT HEAD WITHOUT CONTRAST CT CERVICAL SPINE WITHOUT CONTRAST TECHNIQUE: Multidetector CT imaging of the head and cervical spine was performed following the standard protocol without intravenous contrast. Multiplanar CT image reconstructions of the cervical spine were also generated. COMPARISON:  None. FINDINGS: CT HEAD FINDINGS Brain: No intracranial hemorrhage. No parenchymal contusion. No midline shift or mass effect. Basilar cisterns are patent. No skull base fracture. No fluid in the paranasal sinuses or mastoid air cells. Orbits are normal. Vascular: No hyperdense vessel or unexpected calcification. Skull: Normal. Negative for fracture or focal lesion. Sinuses/Orbits: Paranasal sinuses and mastoid air cells are clear. Orbits are clear. Other: None. CT CERVICAL SPINE FINDINGS Alignment: Normal alignment. Skull base and vertebrae: Normal craniocervical junction. Normal alignment of bodies. No loss vertebral body or height disc height. Avulsion fracture from the spinous process at C7 appears well corticated and likely chronic (image 55, series 8). No evidence of soft tissue changes adjacent to this problem fracture. Calcification of the nuchal ligament more superiorly. Soft tissues and spinal canal: No epidural or paraspinal Disc levels:  Endplate spurring joint space narrowing C5 through C7  Upper chest: Clear Other: None IMPRESSION: 1. No intracranial trauma. 2. No acute cervical spine fracture. 3. Multilevel disc osteophytic disease. 4. Probable remote avulsion fracture of the spinous process at C7. Electronically Signed   By: Suzy Bouchard M.D.   On: 03/04/2016 12:52   Ct Cervical Spine Wo Contrast  Result Date: 03/04/2016 CLINICAL DATA:  Fall.  Low blood sugar. EXAM: CT HEAD WITHOUT CONTRAST CT CERVICAL SPINE WITHOUT CONTRAST TECHNIQUE: Multidetector CT imaging of the head and cervical spine was performed following the standard protocol without intravenous contrast. Multiplanar CT image reconstructions of the cervical spine were also generated. COMPARISON:  None. FINDINGS: CT HEAD FINDINGS Brain: No intracranial hemorrhage. No parenchymal contusion. No midline shift or mass effect. Basilar cisterns are patent. No skull base fracture. No fluid in the paranasal sinuses or mastoid air cells. Orbits are normal. Vascular: No hyperdense vessel or unexpected calcification. Skull: Normal. Negative for fracture or focal lesion. Sinuses/Orbits: Paranasal sinuses and mastoid air cells are clear. Orbits are clear. Other: None. CT CERVICAL SPINE FINDINGS Alignment: Normal alignment. Skull base and vertebrae: Normal craniocervical junction. Normal alignment of bodies. No loss vertebral body or height disc height. Avulsion fracture from the spinous process at C7 appears well corticated and likely chronic (image 55, series 8). No evidence of soft tissue changes adjacent to this problem fracture. Calcification of the nuchal ligament more superiorly. Soft tissues and spinal canal: No epidural or paraspinal Disc levels:  Endplate spurring joint space narrowing C5 through C7 Upper chest: Clear Other: None IMPRESSION: 1. No intracranial trauma. 2. No acute cervical spine fracture. 3. Multilevel disc osteophytic disease. 4. Probable remote avulsion fracture of the spinous process at C7. Electronically Signed   By:  Suzy Bouchard M.D.   On: 03/04/2016 12:52   Dg Shoulder Left  Result Date: 03/04/2016 CLINICAL DATA:  83 year old male fell in the bathroom this morning on his left side with superior left shoulder pain. Initial encounter. EXAM: LEFT SHOULDER - 2+ VIEW COMPARISON:  Chest radiographs 02/12/2015 and earlier. FINDINGS: There is discontinuity of the tip of the acromion, but this appears to be well corticated (image 2 arrows). The left clavicle appears intact. The left scapula elsewhere appears intact. There is superior subluxation of the left humeral head  but no glenohumeral joint dislocation. Proximal left humerus appears intact. Visible left ribs and lung parenchyma appear negative. IMPRESSION: No acute fracture or dislocation identified about the left shoulder. Chronic acromial fracture or os acromiale. Suspect chronic rotator cuff tear. Electronically Signed   By: Genevie Ann M.D.   On: 03/04/2016 13:11    Procedures Procedures (including critical care time)  Medications Ordered in ED Medications  0.9 %  sodium chloride infusion ( Intravenous New Bag/Given 03/04/16 1158)     Initial Impression / Assessment and Plan / ED Course  I have reviewed the triage vital signs and the nursing notes.  Pertinent labs & imaging results that were available during my care of the patient were reviewed by me and considered in my medical decision making (see chart for details).  On repeat exam the patient is in no distress, smiling. I discussed all findings with patient and his family member. We discussed the generally reassuring results. Patient does have trivially elevated troponin, but hasn't chest pain, for or afterwards, has no chest pain now, has no evidence for ongoing ischemia or EKG.  Patient presents after episodes of lightheadedness, fall. There suspicion for hypo-glycemic event given the patient's description of the fall, low suspicion for cardiogenic, neurogenic etiology. Patient had broad labs, CT  scans, x-rays given his history, reassuring results throughout. On repeat exam with no ongoing complaints, no evidence for worsening condition, no evidence for arrhythmia, and discharged in stable condition. Final Clinical Impressions(s) / ED Diagnoses  Lightheadedness Fall Hypoglycemia    Carmin Muskrat, MD 03/04/16 1404

## 2016-03-05 DIAGNOSIS — E118 Type 2 diabetes mellitus with unspecified complications: Secondary | ICD-10-CM | POA: Diagnosis not present

## 2016-03-05 DIAGNOSIS — E162 Hypoglycemia, unspecified: Secondary | ICD-10-CM | POA: Diagnosis not present

## 2016-03-12 MED FILL — IMBRUVICA 140 MG CAPSULE: 140 | 30 days supply | Qty: 60 | Fill #5

## 2016-03-18 DIAGNOSIS — Z9181 History of falling: Secondary | ICD-10-CM | POA: Diagnosis not present

## 2016-03-18 DIAGNOSIS — Z87891 Personal history of nicotine dependence: Secondary | ICD-10-CM | POA: Diagnosis not present

## 2016-03-18 DIAGNOSIS — I10 Essential (primary) hypertension: Secondary | ICD-10-CM | POA: Diagnosis not present

## 2016-03-18 DIAGNOSIS — E118 Type 2 diabetes mellitus with unspecified complications: Secondary | ICD-10-CM | POA: Diagnosis not present

## 2016-03-18 DIAGNOSIS — Z794 Long term (current) use of insulin: Secondary | ICD-10-CM | POA: Diagnosis not present

## 2016-03-18 DIAGNOSIS — E784 Other hyperlipidemia: Secondary | ICD-10-CM | POA: Diagnosis not present

## 2016-03-18 DIAGNOSIS — Z7982 Long term (current) use of aspirin: Secondary | ICD-10-CM | POA: Diagnosis not present

## 2016-03-18 DIAGNOSIS — Z7984 Long term (current) use of oral hypoglycemic drugs: Secondary | ICD-10-CM | POA: Diagnosis not present

## 2016-03-18 DIAGNOSIS — Z0000 Encounter for general adult medical examination without abnormal findings: Secondary | ICD-10-CM | POA: Diagnosis not present

## 2016-03-18 DIAGNOSIS — C9111 Chronic lymphocytic leukemia of B-cell type in remission: Secondary | ICD-10-CM | POA: Diagnosis not present

## 2016-03-18 DIAGNOSIS — D291 Benign neoplasm of prostate: Secondary | ICD-10-CM | POA: Diagnosis not present

## 2016-03-18 DIAGNOSIS — K219 Gastro-esophageal reflux disease without esophagitis: Secondary | ICD-10-CM | POA: Diagnosis not present

## 2016-03-18 DIAGNOSIS — J302 Other seasonal allergic rhinitis: Secondary | ICD-10-CM | POA: Diagnosis not present

## 2016-03-18 DIAGNOSIS — M1380 Other specified arthritis, unspecified site: Secondary | ICD-10-CM | POA: Diagnosis not present

## 2016-03-18 DIAGNOSIS — N329 Bladder disorder, unspecified: Secondary | ICD-10-CM | POA: Diagnosis not present

## 2016-03-20 DIAGNOSIS — E118 Type 2 diabetes mellitus with unspecified complications: Secondary | ICD-10-CM | POA: Diagnosis not present

## 2016-03-20 DIAGNOSIS — E162 Hypoglycemia, unspecified: Secondary | ICD-10-CM | POA: Diagnosis not present

## 2016-03-30 DIAGNOSIS — R69 Illness, unspecified: Secondary | ICD-10-CM | POA: Diagnosis not present

## 2016-04-10 ENCOUNTER — Ambulatory Visit (HOSPITAL_BASED_OUTPATIENT_CLINIC_OR_DEPARTMENT_OTHER): Payer: Medicare HMO | Admitting: Hematology and Oncology

## 2016-04-10 ENCOUNTER — Telehealth: Payer: Self-pay | Admitting: Hematology and Oncology

## 2016-04-10 ENCOUNTER — Other Ambulatory Visit (HOSPITAL_BASED_OUTPATIENT_CLINIC_OR_DEPARTMENT_OTHER): Payer: Medicare HMO

## 2016-04-10 ENCOUNTER — Encounter: Payer: Self-pay | Admitting: Hematology and Oncology

## 2016-04-10 DIAGNOSIS — C9110 Chronic lymphocytic leukemia of B-cell type not having achieved remission: Secondary | ICD-10-CM

## 2016-04-10 DIAGNOSIS — D630 Anemia in neoplastic disease: Secondary | ICD-10-CM | POA: Diagnosis not present

## 2016-04-10 LAB — COMPREHENSIVE METABOLIC PANEL
ALT: 10 U/L (ref 0–55)
ANION GAP: 8 meq/L (ref 3–11)
AST: 17 U/L (ref 5–34)
Albumin: 3.3 g/dL — ABNORMAL LOW (ref 3.5–5.0)
Alkaline Phosphatase: 50 U/L (ref 40–150)
BUN: 17.8 mg/dL (ref 7.0–26.0)
CHLORIDE: 101 meq/L (ref 98–109)
CO2: 29 meq/L (ref 22–29)
CREATININE: 1.3 mg/dL (ref 0.7–1.3)
Calcium: 8.8 mg/dL (ref 8.4–10.4)
EGFR: 59 mL/min/{1.73_m2} — ABNORMAL LOW (ref >90)
Glucose: 221 mg/dl — ABNORMAL HIGH (ref 70–140)
POTASSIUM: 4.9 meq/L (ref 3.5–5.1)
Sodium: 138 mEq/L (ref 136–145)
Total Bilirubin: 0.27 mg/dL (ref 0.20–1.20)
Total Protein: 5.9 g/dL — ABNORMAL LOW (ref 6.4–8.3)

## 2016-04-10 LAB — CBC WITH DIFFERENTIAL/PLATELET
BASO%: 1 % (ref 0.0–2.0)
Basophils Absolute: 0.1 10*3/uL (ref 0.0–0.1)
EOS%: 1.8 % (ref 0.0–7.0)
Eosinophils Absolute: 0.1 10*3/uL (ref 0.0–0.5)
HCT: 34.2 % — ABNORMAL LOW (ref 38.4–49.9)
HGB: 11.1 g/dL — ABNORMAL LOW (ref 13.0–17.1)
LYMPH%: 51.2 % — AB (ref 14.0–49.0)
MCH: 27.1 pg — AB (ref 27.2–33.4)
MCHC: 32.5 g/dL (ref 32.0–36.0)
MCV: 83.3 fL (ref 79.3–98.0)
MONO#: 0.4 10*3/uL (ref 0.1–0.9)
MONO%: 6.4 % (ref 0.0–14.0)
NEUT#: 2.3 10*3/uL (ref 1.5–6.5)
NEUT%: 39.6 % (ref 39.0–75.0)
PLATELETS: 184 10*3/uL (ref 140–400)
RBC: 4.1 10*6/uL — AB (ref 4.20–5.82)
RDW: 17.1 % — ABNORMAL HIGH (ref 11.0–14.6)
WBC: 5.8 10*3/uL (ref 4.0–10.3)
lymph#: 2.9 10*3/uL (ref 0.9–3.3)

## 2016-04-10 MED ORDER — PANTOPRAZOLE SODIUM 40 MG PO TBEC: 40.0000 mg | DELAYED_RELEASE_TABLET | Freq: Every day | ORAL | 11 refills | Status: DC

## 2016-04-10 NOTE — Progress Notes (Signed)
[facility] OFFICE PROGRESS NOTE  Patient Care Team: Anda Kraft, MD as PCP - General (Endocrinology) Heath Lark, MD as Consulting Physician (Hematology and Oncology)  SUMMARY OF ONCOLOGIC HISTORY:   CLL (chronic lymphocytic leukemia) ([place])   04/03/2008 Pathology Results    Case #: [id number] FLow cytomtry confirmed CLL      10/11/2014 Tumor Marker    FISH analysis showed trisomy 12 abnormality      04/29/2015 -  Chemotherapy    He started taking Ibrutinib      04/29/2015 Imaging    Ct scan showed mild lymphadenopathy and splenomegaly      05/13/2015 Adverse Reaction    Due to neutropenia, dose of Ibrutinib is reduced to 280 mg daily      01/08/2016 Imaging    Response to therapy. Resolution of thoracic and abdominal adenopathy. Resolution of splenomegaly. 2. No new or progressive disease. 3. Small bowel containing left inguinal hernia and right-sided hydrocele, as before. 4. Esophageal air fluid level suggests dysmotility or gastroesophageal reflux. 5. Possible constipation. 6. Suspect hepatic hemangiomas, similar.       INTERVAL HISTORY: Please see below for problem oriented charting. He returns for further follow-up. He is compliant taking ibrutinib as prescribed. He had recent trip to the emergency department due to hypoglycemia. His medication has been adjusted. He denies problem.  No recent infection. Denies recent diarrhea  REVIEW OF SYSTEMS:   Constitutional: Denies fevers, chills or abnormal weight loss Eyes: Denies blurriness of vision Ears, nose, mouth, throat, and face: Denies mucositis or sore throat Respiratory: Denies cough, dyspnea or wheezes Cardiovascular: Denies palpitation, chest discomfort or lower extremity swelling Gastrointestinal:  Denies nausea, heartburn or change in bowel habits Skin: Denies abnormal skin rashes Lymphatics: Denies new lymphadenopathy or easy bruising Neurological:Denies numbness, tingling or new  weaknesses Behavioral/Psych: Mood is stable, no new changes  All other systems were reviewed with the patient and are negative.  I have reviewed the past medical history, past surgical history, social history and family history with the patient and they are unchanged from previous note.  ALLERGIES:  has No Known Allergies.  MEDICATIONS:  Current Outpatient Prescriptions  Medication Sig Dispense Refill  . aspirin 325 MG tablet Take 325 mg by mouth daily.      Marland Kitchen atropine-PHENObarbital-scopolamine-hyoscyamine (DONNATAL) 16.2 MG tablet Take 1 tablet by mouth every 8 (eight) hours as needed.      . ibrutinib (IMBRUVICA) 140 MG capsul Take 2 capsules (280 mg total) by mouth daily. 90 capsule 6  . insulin NPH-insulin regular (NOVOLIN 70/30) (70-30) 100 UNIT/ML injection Inject 35 Units into the skin 2 (two) times daily with a meal. Sliding scale    . metFORMIN (GLUCOPHAGE) 1000 MG tablet Take 1,000 mg by mouth 2 (two) times daily with a meal.     . ONE TOUCH ULTRA TEST test strip     . ramipril (ALTACE) 5 MG tablet Take 5 mg by mouth daily.    . Tamsulosin HCl (FLOMAX) 0.4 MG CAPS Take 0.4 mg by mouth daily.    Marland Kitchen torsemide (DEMADEX) 20 MG tablet Take 20 mg by mouth daily.      . pantoprazole (PROTONIX) 40 MG tablet Take 1 tablet (40 mg total) by mouth daily. 30 tablet 11   No current facility-administered medications for this visit.     PHYSICAL EXAMINATION: ECOG PERFORMANCE STATUS: 1 - Symptomatic but completely ambulatory  Vitals:   04/10/16 1109  BP: 116/74  Pulse: 66  Resp: 18  Temp: 97.6 F (36.4 C)   Filed Weights   04/10/16 1109  Weight: 187 lb 9.6 oz (85.1 kg)    GENERAL:alert, no distress and comfortable SKIN: skin color, texture, turgor are normal, no rashes or significant lesions EYES: normal, Conjunctiva are pink and non-injected, sclera clear OROPHARYNX:no exudate, no erythema and lips, buccal mucosa, and tongue normal  NECK: supple, thyroid normal size, non-tender,  without nodularity LYMPH:  no palpable lymphadenopathy in the cervical, axillary or inguinal LUNGS: clear to auscultation and percussion with normal breathing effort HEART: regular rate & rhythm and no murmurs and no lower extremity edema ABDOMEN:abdomen soft, non-tender and normal bowel sounds Musculoskeletal:no cyanosis of digits and no clubbing  NEURO: alert & oriented x 3 with fluent speech, no focal motor/sensory deficits  LABORATORY DATA:  I have reviewed the data as listed    Component Value Date/Time   NA 138 04/10/2016 1056   K 4.9 04/10/2016 1056   CL 102 03/04/2016 1155   CL 104 03/28/2012 1426   CO2 29 04/10/2016 1056   GLUCOSE 221 (H) 04/10/2016 1056   GLUCOSE 171 (H) 03/28/2012 1426   BUN 17.8 04/10/2016 1056   CREATININE 1.3 04/10/2016 1056   CALCIUM 8.8 04/10/2016 1056   PROT 5.9 (L) 04/10/2016 1056   ALBUMIN 3.3 (L) 04/10/2016 1056   AST 17 04/10/2016 1056   ALT 10 04/10/2016 1056   ALKPHOS 50 04/10/2016 1056   BILITOT 0.27 04/10/2016 1056   GFRNONAA >60 03/04/2016 1155   GFRAA >60 03/04/2016 1155    No results found for: SPEP, UPEP  Lab Results  Component Value Date   WBC 5.8 04/10/2016   NEUTROABS 2.3 04/10/2016   HGB 11.1 (L) 04/10/2016   HCT 34.2 (L) 04/10/2016   MCV 83.3 04/10/2016   PLT 184 04/10/2016      Chemistry      Component Value Date/Time   NA 138 04/10/2016 1056   K 4.9 04/10/2016 1056   CL 102 03/04/2016 1155   CL 104 03/28/2012 1426   CO2 29 04/10/2016 1056   BUN 17.8 04/10/2016 1056   CREATININE 1.3 04/10/2016 1056      Component Value Date/Time   CALCIUM 8.8 04/10/2016 1056   ALKPHOS 50 04/10/2016 1056   AST 17 04/10/2016 1056   ALT 10 04/10/2016 1056   BILITOT 0.27 04/10/2016 1056       ASSESSMENT & PLAN:  CLL (chronic lymphocytic leukemia) He tolerated treatment very well with excellent response to treatment with minimum side effects apart from fatigue and mild pancytopenia I have reduced dose of Ibrutinib to  280 mg daily and he tolerated better I recommend we continue to same and I plan to see him back in 3 months with blood work, history and physical examination I would defer imaging study until December 2018. The plan would be to continue ibrutinib for at least 2 years, possibly indefinitely  Anemia in neoplastic disease This is likely anemia of chronic disease. The patient denies recent history of bleeding such as epistaxis, hematuria or hematochezia. He is asymptomatic from the anemia. We will observe for now.    No orders of the defined types were placed in this encounter.  All questions were answered. The patient knows to call the clinic with any problems, questions or concerns. No barriers to learning was detected. I spent 15 minutes counseling the patient face to face. The total time spent in the appointment was 20 minutes and more than 50% was on counseling and  review of test results     Heath Lark, MD 04/10/2016 1:05 PM

## 2016-04-10 NOTE — Telephone Encounter (Signed)
Appointments scheduled per 3.16.18 LOS. Patient given AVS report and calendars with future scheduled appointments.  °

## 2016-04-10 NOTE — Assessment & Plan Note (Signed)
This is likely anemia of chronic disease. The patient denies recent history of bleeding such as epistaxis, hematuria or hematochezia. He is asymptomatic from the anemia. We will observe for now.  

## 2016-04-10 NOTE — Assessment & Plan Note (Signed)
He tolerated treatment very well with excellent response to treatment with minimum side effects apart from fatigue and mild pancytopenia I have reduced dose of Ibrutinib to 280 mg daily and he tolerated better I recommend we continue to same and I plan to see him back in 3 months with blood work, history and physical examination I would defer imaging study until December 2018. The plan would be to continue ibrutinib for at least 2 years, possibly indefinitely

## 2016-04-13 MED FILL — IMBRUVICA 140 MG CAPSULE: 140 | 30 days supply | Qty: 60 | Fill #6

## 2016-04-20 DIAGNOSIS — I340 Nonrheumatic mitral (valve) insufficiency: Secondary | ICD-10-CM | POA: Diagnosis not present

## 2016-04-20 DIAGNOSIS — I472 Ventricular tachycardia: Secondary | ICD-10-CM | POA: Diagnosis not present

## 2016-04-20 DIAGNOSIS — R55 Syncope and collapse: Secondary | ICD-10-CM | POA: Diagnosis not present

## 2016-05-13 MED FILL — IMBRUVICA 140 MG CAPSULE: 140 | 30 days supply | Qty: 60 | Fill #7

## 2016-05-14 ENCOUNTER — Telehealth: Payer: Self-pay | Admitting: Pharmacist

## 2016-05-14 NOTE — Telephone Encounter (Signed)
Oral Chemotherapy Pharmacist Encounter  Received notification from Stefanie Libel, financial advocate at [facility], that she had received voicemail from patient requesting copayment support for his Imbruvica. I called [facility] for further information. Patient has a $400 copay due for new fill of Imbruvica as he has depleted current copayment grant from [facility] secured on 04/23/15 [facility] left 2 messages for patient on 05/13/16 to call them back to assist with locating new copayment support. I will not reach out to patient at this time as the pharmacy is already doing that.  Oral Oncology Clinic will continue to follow.  Johny Drilling, PharmD, BCPS, BCOP 05/14/2016  12:20 PM Oral Oncology Clinic [phone]

## 2016-05-22 DIAGNOSIS — R69 Illness, unspecified: Secondary | ICD-10-CM | POA: Diagnosis not present

## 2016-05-22 NOTE — Telephone Encounter (Signed)
Oral Chemotherapy Pharmacist Encounter  [facility] was able to sign patient up for copayment assistance with the [employer]. Physician form completed and faxed back to [facility]  Received form back today with request for MD note to be sent it as there was a discrepancy with the ICD-10 code provided by the pharmacy and the one provided by the office. MD notes and [facility] physician form faxed back to direct supervisors line at [phone]  I updated [facility] on the status.  Oral oncology Clinic will continue to follow.  Johny Drilling, PharmD, BCPS, BCOP 05/22/2016  3:39 PM Oral Oncology Clinic [phone]

## 2016-06-10 DIAGNOSIS — E118 Type 2 diabetes mellitus with unspecified complications: Secondary | ICD-10-CM | POA: Diagnosis not present

## 2016-06-10 DIAGNOSIS — Z79899 Other long term (current) drug therapy: Secondary | ICD-10-CM | POA: Diagnosis not present

## 2016-06-10 MED FILL — IMBRUVICA 140 MG CAPSULE: 140 | 30 days supply | Qty: 60 | Fill #8

## 2016-06-17 DIAGNOSIS — I10 Essential (primary) hypertension: Secondary | ICD-10-CM | POA: Diagnosis not present

## 2016-06-17 DIAGNOSIS — N400 Enlarged prostate without lower urinary tract symptoms: Secondary | ICD-10-CM | POA: Diagnosis not present

## 2016-06-17 DIAGNOSIS — E118 Type 2 diabetes mellitus with unspecified complications: Secondary | ICD-10-CM | POA: Diagnosis not present

## 2016-07-03 MED FILL — IMBRUVICA 140 MG CAPSULE: 140 | 30 days supply | Qty: 60 | Fill #9

## 2016-07-04 ENCOUNTER — Encounter (HOSPITAL_COMMUNITY): Payer: Self-pay | Admitting: Emergency Medicine

## 2016-07-04 ENCOUNTER — Emergency Department (HOSPITAL_COMMUNITY)
Admission: EM | Admit: 2016-07-04 | Discharge: 2016-07-04 | Disposition: A | Discharge status: Home / Self Care | Payer: Medicare HMO | Attending: Emergency Medicine | Admitting: Emergency Medicine

## 2016-07-04 ENCOUNTER — Emergency Department (HOSPITAL_COMMUNITY): Payer: Medicare HMO

## 2016-07-04 DIAGNOSIS — E119 Type 2 diabetes mellitus without complications: Secondary | ICD-10-CM | POA: Diagnosis not present

## 2016-07-04 DIAGNOSIS — M545 Low back pain, unspecified: Secondary | ICD-10-CM

## 2016-07-04 DIAGNOSIS — Y929 Unspecified place or not applicable: Secondary | ICD-10-CM | POA: Diagnosis not present

## 2016-07-04 DIAGNOSIS — Y9389 Activity, other specified: Secondary | ICD-10-CM | POA: Insufficient documentation

## 2016-07-04 DIAGNOSIS — Z7982 Long term (current) use of aspirin: Secondary | ICD-10-CM | POA: Diagnosis not present

## 2016-07-04 DIAGNOSIS — Y999 Unspecified external cause status: Secondary | ICD-10-CM | POA: Diagnosis not present

## 2016-07-04 DIAGNOSIS — M549 Dorsalgia, unspecified: Secondary | ICD-10-CM | POA: Diagnosis not present

## 2016-07-04 DIAGNOSIS — X58XXXA Exposure to other specified factors, initial encounter: Secondary | ICD-10-CM | POA: Insufficient documentation

## 2016-07-04 DIAGNOSIS — Z794 Long term (current) use of insulin: Secondary | ICD-10-CM | POA: Diagnosis not present

## 2016-07-04 DIAGNOSIS — M7918 Myalgia, other site: Secondary | ICD-10-CM

## 2016-07-04 DIAGNOSIS — S3992XA Unspecified injury of lower back, initial encounter: Secondary | ICD-10-CM | POA: Diagnosis not present

## 2016-07-04 MED ORDER — MORPHINE SULFATE (PF) 4 MG/ML IV SOLN
4.0000 mg | Freq: Once | INTRAVENOUS | Status: DC
  Filled 2016-07-04: qty 1

## 2016-07-04 MED ORDER — ACETAMINOPHEN 325 MG PO TABS
650.0000 mg | ORAL_TABLET | Freq: Once | ORAL | Status: AC
  Administered 2016-07-04: 650 mg via ORAL
  Filled 2016-07-04: qty 2

## 2016-07-04 MED ORDER — DIAZEPAM 5 MG/ML IJ SOLN: 5.0000 mg | Freq: Once | INTRAMUSCULAR | Status: DC

## 2016-07-04 MED ORDER — DIAZEPAM 5 MG/ML IJ SOLN
2.0000 mg | Freq: Once | INTRAMUSCULAR | Status: AC
  Administered 2016-07-04: 2 mg via INTRAVENOUS
  Filled 2016-07-04: qty 2

## 2016-07-04 MED ORDER — ACETAMINOPHEN 325 MG PO TABS: 650.0000 mg | ORAL_TABLET | Freq: Once | ORAL | Status: DC

## 2016-07-04 MED ORDER — DEXAMETHASONE SODIUM PHOSPHATE 10 MG/ML IJ SOLN
10.0000 mg | Freq: Once | INTRAMUSCULAR | Status: DC
  Filled 2016-07-04: qty 1

## 2016-07-04 MED ORDER — DEXAMETHASONE SODIUM PHOSPHATE 10 MG/ML IJ SOLN
10.0000 mg | Freq: Once | INTRAMUSCULAR | Status: AC
  Administered 2016-07-04: 10 mg via INTRAVENOUS

## 2016-07-04 MED ORDER — OXYCODONE-ACETAMINOPHEN 5-325 MG PO TABS
1.0000 | ORAL_TABLET | Freq: Once | ORAL | Status: AC
Start: 2016-07-04 — End: 2016-07-04
  Administered 2016-07-04: 1 via ORAL
  Filled 2016-07-04: qty 1

## 2016-07-04 MED ORDER — DIAZEPAM 5 MG PO TABS
5.0000 mg | ORAL_TABLET | Freq: Once | ORAL | Status: AC
  Administered 2016-07-04: 5 mg via ORAL
  Filled 2016-07-04: qty 1

## 2016-07-04 MED ORDER — MORPHINE SULFATE (PF) 4 MG/ML IV SOLN
4.0000 mg | Freq: Once | INTRAVENOUS | Status: AC
  Administered 2016-07-04: 4 mg via INTRAVENOUS

## 2016-07-04 MED ORDER — OXYCODONE-ACETAMINOPHEN 5-325 MG PO TABS: 1.0000 | ORAL_TABLET | ORAL | 0 refills | Status: DC | PRN

## 2016-07-04 NOTE — Discharge Instructions (Signed)
Please read and follow all provided instructions.  Your diagnoses today include:  1. Acute right-sided low back pain without sciatica   2. Musculoskeletal pain     Tests performed today include: Vital signs - see below for your results today  Medications prescribed:   Take any prescribed medications only as directed.  Home care instructions:  Follow any educational materials contained in this packet Please rest, use ice or heat on your back for the next several days Do not lift, push, pull anything more than 10 pounds for the next week  Follow-up instructions: Please follow-up with your primary care provider in the next 1 week for further evaluation of your symptoms.   Return instructions:  SEEK IMMEDIATE MEDICAL ATTENTION IF YOU HAVE: New numbness, tingling, weakness, or problem with the use of your arms or legs Severe back pain not relieved with medications Loss control of your bowels or bladder Increasing pain in any areas of the body (such as chest or abdominal pain) Shortness of breath, dizziness, or fainting.  Worsening nausea (feeling sick to your stomach), vomiting, fever, or sweats Any other emergent concerns regarding your health   Additional Information:  Your vital signs today were: BP (!) 116/59    Pulse 66    Temp 98.3 F (36.8 C) (Oral)    Resp 18    Ht 5\' 7"  (1.702 m)    Wt 88.9 kg (196 lb)    SpO2 98%    BMI 30.70 kg/m  If your blood pressure (BP) was elevated above 135/85 this visit, please have this repeated by your doctor within one month. --------------

## 2016-07-04 NOTE — ED Triage Notes (Signed)
Pt c/o back pain and leg pain that has been intermittent for past 3 weeks. Pt reports that his wife has been falling a lot and when he attempted to catch her is when his pain began.

## 2016-07-04 NOTE — ED Provider Notes (Signed)
[facility] DEPT Provider Note   CSN: [id number] Arrival date & time: 07/04/16  4401     History   Chief Complaint Chief Complaint  Patient presents with  . Back Pain  . Leg Pain    HPI Michael [name] is a 83 y.o. male.  HPI  83 y.o. male with a hx of DM, CLL, presents to the Emergency Department today due to back and leg pain that has been intermittent for the past 3 weeks. Notes that his wife has been falling a lot and attempted to catch her when symptoms began. Saw his PCP afterwards and given pain medication with improvement of lower back pain on right side. States that once he ran out of pain medications he felt pain again. This time he felt pain in bilateral lower extremities along quadriceps on anterior aspect as well as bilateral lower lumbar spine. Noted midline tenderness. No loss of bowel or bladder function. No saddle anesthesia. No CP/SOB/ABD pain. No numbness/tingling. No fevers. No dysuria. Pt states that he has been unable to put on his compression stockings and has now developed some BLE edema. Takes Lasix daily for this. Of note, Currently being treated for CLL with Ibrutinib and followed by Dr. Alvy Bimler. No other symptoms noted.   Past Medical History:  Diagnosis Date  . Arthritis   . cll dx'd 2000   no rx  . CLL (chronic lymphoblastic leukemia)   . Diabetes mellitus   . Leukemia, chronic lymphoid ([place]) 12/08/2010  . Lymphocytosis     Patient Active Problem List   Diagnosis Date Noted  . Protein-calorie malnutrition, mild ([place]) 01/10/2016  . Diarrhea due to drug 06/17/2015  . Chronic left shoulder pain 06/17/2015  . Bilateral leg edema 06/17/2015  . Pancytopenia, acquired ([place]) 04/22/2015  . Thrombocytopenia ([place]) 10/11/2014  . 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 CLL (chronic lymphocytic leukemia) ([place]) 12/08/2010    Past Surgical History:  Procedure Laterality Date  . CATARACT EXTRACTION         Home Medications    Prior to Admission  medications   Medication Sig Start Date End Date Taking? Authorizing Provider  aspirin 325 MG tablet Take 325 mg by mouth daily.      [provider]  atropine-PHENObarbital-scopolamine-hyoscyamine (DONNATAL) 16.2 MG tablet Take 1 tablet by mouth every 8 (eight) hours as needed.      [provider]  ibrutinib (IMBRUVICA) 140 MG capsul Take 2 capsules (280 mg total) by mouth daily. 10/14/15   Heath Lark, MD  insulin NPH-insulin regular (NOVOLIN 70/30) (70-30) 100 UNIT/ML injection Inject 35 Units into the skin 2 (two) times daily with a meal. Sliding scale    [provider]  metFORMIN (GLUCOPHAGE) 1000 MG tablet Take 1,000 mg by mouth 2 (two) times daily with a meal.     [provider]  ONE TOUCH ULTRA TEST test strip  05/16/13   [provider]  pantoprazole (PROTONIX) 40 MG tablet Take 1 tablet (40 mg total) by mouth daily. 04/10/16   Heath Lark, MD  ramipril (ALTACE) 5 MG tablet Take 5 mg by mouth daily.    [provider]  Tamsulosin HCl (FLOMAX) 0.4 MG CAPS Take 0.4 mg by mouth daily.    [provider]  torsemide (DEMADEX) 20 MG tablet Take 20 mg by mouth daily.      [provider]    Family History Family History  Problem Relation Age of Onset  . Cancer Mother   .  Hypertension Father   . Diabetes Father     Social History Social History  Substance Use Topics  . Smoking status: Never Smoker  . Smokeless tobacco: Former Systems developer    Types: Snuff  . Alcohol use No     Allergies   Patient has no known allergies.   Review of Systems Review of Systems ROS reviewed and all are negative for acute change except as noted in the HPI.  Physical Exam Updated Vital Signs BP (!) 145/63   Pulse 86   Temp 98.3 F (36.8 C) (Oral)   Resp 18   Ht 5\' 7"  (1.702 m)   Wt 88.9 kg (196 lb)   SpO2 100%   BMI 30.70 kg/m   Physical Exam  Constitutional: He is oriented to person, place, and time. Vital signs are  normal. He appears well-developed and well-nourished.  HENT:  Head: Normocephalic and atraumatic.  Right Ear: Hearing normal.  Left Ear: Hearing normal.  Eyes: Conjunctivae and EOM are normal. Pupils are equal, round, and reactive to light.  Neck: Normal range of motion. Neck supple.  Cardiovascular: Normal rate, regular rhythm, normal heart sounds and intact distal pulses.   Pulmonary/Chest: Effort normal and breath sounds normal.  Abdominal: Soft. Bowel sounds are normal.  Musculoskeletal: Normal range of motion.  BLE edema 2+. NVI. Distal pulses appreciated. TTP lower lumbar spine as well as bilateral lower lumbar musculature R > L. No palpable or visible deformities.   Neurological: He is alert and oriented to person, place, and time. He has normal strength. No sensory deficit.  BLE Motor/senastion intact. Strength equal bilaterally. Able to ambulate without difficulty. DTRs intact.   Skin: Skin is warm and dry.  Psychiatric: He has a normal mood and affect. His speech is normal and behavior is normal. Thought content normal.  Nursing note and vitals reviewed.  ED Treatments / Results  Labs (all labs ordered are listed, but only abnormal results are displayed) Labs Reviewed - No data to display  EKG  EKG Interpretation None       Radiology Dg Lumbar Spine Complete  Result Date: 07/04/2016 CLINICAL DATA:  Low back and right leg pain intermittently for the past 3 weeks. The pain began when catching his wife who was falling. EXAM: LUMBAR SPINE - COMPLETE 4+ VIEW COMPARISON:  Chest, abdomen and pelvis CT dated 01/08/2016. FINDINGS: Five non-rib-bearing lumbar vertebrae. Extensive facet degenerative changes at the L4-5 and L5-S1 levels and to lesser degree at the remaining levels of the lumbar spine. Mild anterior spur formation at multiple levels and in the lower thoracic spine. Grade 1 anterolisthesis at the L4-5 level with mild progression. Possible interval L5 pars defects with  normal alignment at the L5-S1 level. IMPRESSION: 1. Possible interval L5 spondylolysis. 2. Mildly progressive grade 1 spondylolisthesis at the L4-5 level due to facet degenerative changes at that level. 3. Multilevel degenerative changes, including marked facet degenerative changes at the L4-5 and L5-S1 levels. Electronically Signed   By: Claudie Revering M.D.   On: 07/04/2016 12:03    Procedures Procedures (including critical care time)  Medications Ordered in ED Medications - No data to display   Initial Impression / Assessment and Plan / ED Course  I have reviewed the triage vital signs and the nursing notes.  Pertinent labs & imaging results that were available during my care of the patient were reviewed by me and considered in my medical decision making (see chart for details).  Final Clinical Impressions(s) / ED  Diagnoses  {I have reviewed and evaluated the relevant imaging studies.  {I have reviewed the relevant previous healthcare records.  {I obtained HPI from historian. {Patient discussed with supervising physician.  ED Course:  Assessment: Pt is a 83 y.o. male with hx DM, CLL who presents with x 3 weeks lower lumbar pain s/p mechanical injury trying to catch his wife. Notes seeing PCP initially with improvement with pain medication, but returned once pain meds ran out. States pain in bilateral lower lumbar spine as well as midline. Also noted pain in anterior quadriceps muscles. No fevers. No dysuria. No loss of bowel or bladder function. No saddle anesthesia. Ambulates well, but difficult due to pain. On exam, pt in NAD. Nontoxic/nonseptic appearing. VSS. Afebrile. Lungs CTA. Heart RRR. Abdomen nontender soft. Noted pitting edema bilaterally 2+, but pt unable to put on compression stockings. Motor/sensation intact. DTR intact. Strength equal bilaterally. Given valium as well as Tylenol with improvement. DG lumbar ordered. Concern for possible mets as well given CLL, but BLE neurological  exam unremarkable. Imaging unremarkable. Discussed with supervising physician who has seen patient. Likely musculoskeletal. Plan is to DC home with analgesia. I have reviewed the [place] Controlled Substance Reporting System. Given Rx percocet #12. At time of discharge, Patient is in no acute distress. Vital Signs are stable. Patient is able to ambulate. Patient able to tolerate PO.   Disposition/Plan:  DC Home Additional Verbal discharge instructions given and discussed with patient.  Pt Instructed to f/u with PCP in the next week for evaluation and treatment of symptoms. Return precautions given Pt acknowledges and agrees with plan  Supervising Physician Lacretia Leigh, MD  Final diagnoses:  Acute right-sided low back pain without sciatica  Musculoskeletal pain    New Prescriptions New Prescriptions   No medications on file     Shary Decamp, PA-C 07/04/16 1440

## 2016-07-04 NOTE — ED Provider Notes (Signed)
Medical screening examination/treatment/procedure(s) were conducted as a shared visit with non-physician practitioner(s) and myself.  I personally evaluated the patient during the encounter.   EKG Interpretation None     83 year old male presents with lower lumbar spine pain after mechanical injury several weeks ago. No red flags for cauda equina appreciated. X-ray shows no evidence of acute fracture. He is tender at the paraspinal muscles lower lumbar spine. We'll medicate here and discharged with follow-up   Lacretia Leigh, MD 07/04/16 1217

## 2016-07-06 DIAGNOSIS — M479 Spondylosis, unspecified: Secondary | ICD-10-CM | POA: Diagnosis not present

## 2016-07-06 DIAGNOSIS — M5441 Lumbago with sciatica, right side: Secondary | ICD-10-CM | POA: Diagnosis not present

## 2016-07-07 DIAGNOSIS — R69 Illness, unspecified: Secondary | ICD-10-CM | POA: Diagnosis not present

## 2016-07-08 DIAGNOSIS — M5136 Other intervertebral disc degeneration, lumbar region: Secondary | ICD-10-CM | POA: Diagnosis not present

## 2016-07-08 DIAGNOSIS — M5416 Radiculopathy, lumbar region: Secondary | ICD-10-CM | POA: Diagnosis not present

## 2016-07-09 DIAGNOSIS — M5416 Radiculopathy, lumbar region: Secondary | ICD-10-CM | POA: Diagnosis not present

## 2016-07-10 ENCOUNTER — Other Ambulatory Visit: Payer: Self-pay | Admitting: Hematology and Oncology

## 2016-07-10 DIAGNOSIS — C9110 Chronic lymphocytic leukemia of B-cell type not having achieved remission: Secondary | ICD-10-CM

## 2016-07-13 ENCOUNTER — Other Ambulatory Visit (HOSPITAL_BASED_OUTPATIENT_CLINIC_OR_DEPARTMENT_OTHER): Payer: Medicare HMO

## 2016-07-13 ENCOUNTER — Ambulatory Visit (HOSPITAL_BASED_OUTPATIENT_CLINIC_OR_DEPARTMENT_OTHER): Payer: Medicare HMO | Admitting: Hematology and Oncology

## 2016-07-13 VITALS — BP 144/46 | HR 77 | Temp 98.4°F | Resp 18 | Ht 67.0 in | Wt 194.4 lb

## 2016-07-13 DIAGNOSIS — M545 Low back pain: Secondary | ICD-10-CM

## 2016-07-13 DIAGNOSIS — D630 Anemia in neoplastic disease: Secondary | ICD-10-CM | POA: Diagnosis not present

## 2016-07-13 DIAGNOSIS — C9110 Chronic lymphocytic leukemia of B-cell type not having achieved remission: Secondary | ICD-10-CM

## 2016-07-13 DIAGNOSIS — M5416 Radiculopathy, lumbar region: Secondary | ICD-10-CM | POA: Diagnosis not present

## 2016-07-13 DIAGNOSIS — M5136 Other intervertebral disc degeneration, lumbar region: Secondary | ICD-10-CM | POA: Diagnosis not present

## 2016-07-13 LAB — CBC WITH DIFFERENTIAL/PLATELET
BASO%: 0.6 % (ref 0.0–2.0)
Basophils Absolute: 0 10*3/uL (ref 0.0–0.1)
EOS ABS: 0 10*3/uL (ref 0.0–0.5)
EOS%: 0.5 % (ref 0.0–7.0)
HCT: 31.6 % — ABNORMAL LOW (ref 38.4–49.9)
HEMOGLOBIN: 10.5 g/dL — AB (ref 13.0–17.1)
LYMPH%: 20.8 % (ref 14.0–49.0)
MCH: 27.8 pg (ref 27.2–33.4)
MCHC: 33.1 g/dL (ref 32.0–36.0)
MCV: 83.9 fL (ref 79.3–98.0)
MONO#: 0.4 10*3/uL (ref 0.1–0.9)
MONO%: 4.7 % (ref 0.0–14.0)
NEUT%: 73.4 % (ref 39.0–75.0)
NEUTROS ABS: 6 10*3/uL (ref 1.5–6.5)
PLATELETS: 361 10*3/uL (ref 140–400)
RBC: 3.77 10*6/uL — ABNORMAL LOW (ref 4.20–5.82)
RDW: 16.4 % — ABNORMAL HIGH (ref 11.0–14.6)
WBC: 8.2 10*3/uL (ref 4.0–10.3)
lymph#: 1.7 10*3/uL (ref 0.9–3.3)

## 2016-07-13 LAB — TECHNOLOGIST REVIEW

## 2016-07-14 ENCOUNTER — Encounter: Payer: Self-pay | Admitting: Hematology and Oncology

## 2016-07-14 NOTE — Assessment & Plan Note (Signed)
He tolerated treatment very well with excellent response to treatment with minimum side effects apart from fatigue and mild pancytopenia I have reduced dose of Ibrutinib to 280 mg daily and he tolerated better I recommend we continue to same and I plan to see him back in 3 months with blood work, history, CT scan and physical examination The plan would be to continue ibrutinib for at least 2 years, possibly indefinitely

## 2016-07-14 NOTE — Progress Notes (Signed)
[facility] OFFICE PROGRESS NOTE  Patient Care Team: Anda Kraft, MD as PCP - General (Endocrinology) Heath Lark, MD as Consulting Physician (Hematology and Oncology)  SUMMARY OF ONCOLOGIC HISTORY:   CLL (chronic lymphocytic leukemia) ([place])   04/03/2008 Pathology Results    Case #: [id number] FLow cytomtry confirmed CLL      10/11/2014 Tumor Marker    FISH analysis showed trisomy 12 abnormality      04/29/2015 -  Chemotherapy    He started taking Ibrutinib      04/29/2015 Imaging    Ct scan showed mild lymphadenopathy and splenomegaly      05/13/2015 Adverse Reaction    Due to neutropenia, dose of Ibrutinib is reduced to 280 mg daily      01/08/2016 Imaging    Response to therapy. Resolution of thoracic and abdominal adenopathy. Resolution of splenomegaly. 2. No new or progressive disease. 3. Small bowel containing left inguinal hernia and right-sided hydrocele, as before. 4. Esophageal air fluid level suggests dysmotility or gastroesophageal reflux. 5. Possible constipation. 6. Suspect hepatic hemangiomas, similar.       INTERVAL HISTORY: Please see below for problem oriented charting. He returns for further follow-up He tolerated treatment well He denies irregular heartbeat, diarrhea or skin bruising Denies recent infection His main complaint is recent back pain due to strain muscle He has appointment to see orthopedist for further evaluation  REVIEW OF SYSTEMS:   Constitutional: Denies fevers, chills or abnormal weight loss Eyes: Denies blurriness of vision Ears, nose, mouth, throat, and face: Denies mucositis or sore throat Respiratory: Denies cough, dyspnea or wheezes Cardiovascular: Denies palpitation, chest discomfort or lower extremity swelling Gastrointestinal:  Denies nausea, heartburn or change in bowel habits Skin: Denies abnormal skin rashes Lymphatics: Denies new lymphadenopathy or easy bruising Neurological:Denies numbness, tingling or new  weaknesses Behavioral/Psych: Mood is stable, no new changes  All other systems were reviewed with the patient and are negative.  I have reviewed the past medical history, past surgical history, social history and family history with the patient and they are unchanged from previous note.  ALLERGIES:  has No Known Allergies.  MEDICATIONS:  Current Outpatient Prescriptions  Medication Sig Dispense Refill  . aspirin 325 MG tablet Take 325 mg by mouth daily.      Marland Kitchen atropine-PHENObarbital-scopolamine-hyoscyamine (DONNATAL) 16.2 MG tablet Take 1 tablet by mouth every 8 (eight) hours as needed for cramping.     . ibrutinib (IMBRUVICA) 140 MG capsul Take 2 capsules (280 mg total) by mouth daily. 90 capsule 6  . insulin NPH-insulin regular (NOVOLIN 70/30) (70-30) 100 UNIT/ML injection Inject 20 Units into the skin 2 (two) times daily with a meal. Sliding scale    . metFORMIN (GLUCOPHAGE) 1000 MG tablet Take 1,000 mg by mouth 2 (two) times daily with a meal.     . Multiple Vitamin (MULTIVITAMIN WITH MINERALS) TABS tablet Take 1 tablet by mouth daily.    Marland Kitchen oxyCODONE-acetaminophen (PERCOCET/ROXICET) 5-325 MG tablet Take 1 tablet by mouth every 4 (four) hours as needed for severe pain. 12 tablet 0  . pantoprazole (PROTONIX) 40 MG tablet Take 1 tablet (40 mg total) by mouth daily. 30 tablet 11  . ramipril (ALTACE) 5 MG tablet Take 5 mg by mouth daily.    . Tamsulosin HCl (FLOMAX) 0.4 MG CAPS Take 0.4 mg by mouth daily after supper.     . torsemide (DEMADEX) 20 MG tablet Take 20 mg by mouth daily.       No  current facility-administered medications for this visit.     PHYSICAL EXAMINATION: ECOG PERFORMANCE STATUS: 1 - Symptomatic but completely ambulatory  Vitals:   07/13/16 1149  BP: (!) 144/46  Pulse: 77  Resp: 18  Temp: 98.4 F (36.9 C)   Filed Weights   07/13/16 1149  Weight: 194 lb 6.4 oz (88.2 kg)    GENERAL:alert, no distress and comfortable SKIN: skin color, texture, turgor are  normal, no rashes or significant lesions EYES: normal, Conjunctiva are pink and non-injected, sclera clear OROPHARYNX:no exudate, no erythema and lips, buccal mucosa, and tongue normal  NECK: supple, thyroid normal size, non-tender, without nodularity LYMPH:  no palpable lymphadenopathy in the cervical, axillary or inguinal LUNGS: clear to auscultation and percussion with normal breathing effort HEART: regular rate & rhythm and no murmurs and no lower extremity edema ABDOMEN:abdomen soft, non-tender and normal bowel sounds Musculoskeletal:no cyanosis of digits and no clubbing  NEURO: alert & oriented x 3 with fluent speech, no focal motor/sensory deficits  LABORATORY DATA:  I have reviewed the data as listed    Component Value Date/Time   NA 138 04/10/2016 1056   K 4.9 04/10/2016 1056   CL 102 03/04/2016 1155   CL 104 03/28/2012 1426   CO2 29 04/10/2016 1056   GLUCOSE 221 (H) 04/10/2016 1056   GLUCOSE 171 (H) 03/28/2012 1426   BUN 17.8 04/10/2016 1056   CREATININE 1.3 04/10/2016 1056   CALCIUM 8.8 04/10/2016 1056   PROT 5.9 (L) 04/10/2016 1056   ALBUMIN 3.3 (L) 04/10/2016 1056   AST 17 04/10/2016 1056   ALT 10 04/10/2016 1056   ALKPHOS 50 04/10/2016 1056   BILITOT 0.27 04/10/2016 1056   GFRNONAA >60 03/04/2016 1155   GFRAA >60 03/04/2016 1155    No results found for: SPEP, UPEP  Lab Results  Component Value Date   WBC 8.2 07/13/2016   NEUTROABS 6.0 07/13/2016   HGB 10.5 (L) 07/13/2016   HCT 31.6 (L) 07/13/2016   MCV 83.9 07/13/2016   PLT 361 07/13/2016      Chemistry      Component Value Date/Time   NA 138 04/10/2016 1056   K 4.9 04/10/2016 1056   CL 102 03/04/2016 1155   CL 104 03/28/2012 1426   CO2 29 04/10/2016 1056   BUN 17.8 04/10/2016 1056   CREATININE 1.3 04/10/2016 1056      Component Value Date/Time   CALCIUM 8.8 04/10/2016 1056   ALKPHOS 50 04/10/2016 1056   AST 17 04/10/2016 1056   ALT 10 04/10/2016 1056   BILITOT 0.27 04/10/2016 1056        RADIOGRAPHIC STUDIES: I have personally reviewed the radiological images as listed and agreed with the findings in the report. Dg Lumbar Spine Complete  Result Date: 07/04/2016 CLINICAL DATA:  Low back and right leg pain intermittently for the past 3 weeks. The pain began when catching his wife who was falling. EXAM: LUMBAR SPINE - COMPLETE 4+ VIEW COMPARISON:  Chest, abdomen and pelvis CT dated 01/08/2016. FINDINGS: Five non-rib-bearing lumbar vertebrae. Extensive facet degenerative changes at the L4-5 and L5-S1 levels and to lesser degree at the remaining levels of the lumbar spine. Mild anterior spur formation at multiple levels and in the lower thoracic spine. Grade 1 anterolisthesis at the L4-5 level with mild progression. Possible interval L5 pars defects with normal alignment at the L5-S1 level. IMPRESSION: 1. Possible interval L5 spondylolysis. 2. Mildly progressive grade 1 spondylolisthesis at the L4-5 level due to facet degenerative changes  at that level. 3. Multilevel degenerative changes, including marked facet degenerative changes at the L4-5 and L5-S1 levels. Electronically Signed   By: Claudie Revering M.D.   On: 07/04/2016 12:03    ASSESSMENT & PLAN:  CLL (chronic lymphocytic leukemia) He tolerated treatment very well with excellent response to treatment with minimum side effects apart from fatigue and mild pancytopenia I have reduced dose of Ibrutinib to 280 mg daily and he tolerated better I recommend we continue to same and I plan to see him back in 3 months with blood work, history, CT scan and physical examination The plan would be to continue ibrutinib for at least 2 years, possibly indefinitely  Anemia in neoplastic disease This is likely anemia of chronic disease. The patient denies recent history of bleeding such as epistaxis, hematuria or hematochezia. He is asymptomatic from the anemia. We will observe for now.    Orders Placed This Encounter  Procedures  . CT  ABDOMEN PELVIS W CONTRAST    Standing Status:   Future    Standing Expiration Date:   10/13/2017    Order Specific Question:   Reason for Exam (SYMPTOM  OR DIAGNOSIS REQUIRED)    Answer:   CLL staging    Order Specific Question:   Preferred imaging location?    Answer:   [facility]  . Comprehensive metabolic panel    Standing Status:   Future    Standing Expiration Date:   10/13/2017  . CBC with Differential/Platelet    Standing Status:   Future    Standing Expiration Date:   10/13/2017  . Lactate dehydrogenase    Standing Status:   Future    Standing Expiration Date:   10/13/2017   All questions were answered. The patient knows to call the clinic with any problems, questions or concerns. No barriers to learning was detected. I spent 15 minutes counseling the patient face to face. The total time spent in the appointment was 20 minutes and more than 50% was on counseling and review of test results     Heath Lark, MD 07/14/2016 5:27 PM

## 2016-07-14 NOTE — Assessment & Plan Note (Signed)
This is likely anemia of chronic disease. The patient denies recent history of bleeding such as epistaxis, hematuria or hematochezia. He is asymptomatic from the anemia. We will observe for now.  

## 2016-07-16 ENCOUNTER — Telehealth: Payer: Self-pay

## 2016-07-16 NOTE — Telephone Encounter (Signed)
Spoke with patient and mailed a calendar with appts   Michael [name]

## 2016-07-17 ENCOUNTER — Ambulatory Visit
Admission: RE | Admit: 2016-07-17 | Discharge: 2016-07-17 | Disposition: A | Discharge status: Home / Self Care | Payer: Medicare HMO | Source: Ambulatory Visit | Attending: Physical Medicine and Rehabilitation | Admitting: Physical Medicine and Rehabilitation

## 2016-07-17 ENCOUNTER — Other Ambulatory Visit: Payer: Self-pay | Admitting: Physical Medicine and Rehabilitation

## 2016-07-17 DIAGNOSIS — M5136 Other intervertebral disc degeneration, lumbar region: Secondary | ICD-10-CM

## 2016-07-17 DIAGNOSIS — M545 Low back pain: Secondary | ICD-10-CM | POA: Diagnosis not present

## 2016-07-24 DIAGNOSIS — M5136 Other intervertebral disc degeneration, lumbar region: Secondary | ICD-10-CM | POA: Diagnosis not present

## 2016-07-24 DIAGNOSIS — M5416 Radiculopathy, lumbar region: Secondary | ICD-10-CM | POA: Diagnosis not present

## 2016-07-27 DIAGNOSIS — E118 Type 2 diabetes mellitus with unspecified complications: Secondary | ICD-10-CM | POA: Diagnosis not present

## 2016-07-27 DIAGNOSIS — Z0000 Encounter for general adult medical examination without abnormal findings: Secondary | ICD-10-CM | POA: Diagnosis not present

## 2016-07-27 DIAGNOSIS — E7800 Pure hypercholesterolemia, unspecified: Secondary | ICD-10-CM | POA: Diagnosis not present

## 2016-07-27 DIAGNOSIS — C9110 Chronic lymphocytic leukemia of B-cell type not having achieved remission: Secondary | ICD-10-CM | POA: Diagnosis not present

## 2016-07-27 DIAGNOSIS — R609 Edema, unspecified: Secondary | ICD-10-CM | POA: Diagnosis not present

## 2016-07-27 MED FILL — IMBRUVICA 140 MG CAPSULE: 140 | 15 days supply | Qty: 30 | Fill #10

## 2016-07-30 ENCOUNTER — Encounter: Payer: Self-pay | Admitting: *Deleted

## 2016-07-30 ENCOUNTER — Other Ambulatory Visit: Payer: Self-pay | Admitting: *Deleted

## 2016-07-30 ENCOUNTER — Telehealth: Payer: Self-pay | Admitting: Pharmacist

## 2016-07-30 MED ORDER — IBRUTINIB 140 MG PO CAPS: 280.0000 mg | ORAL_CAPSULE | Freq: Every day | ORAL | 6 refills | Status: DC

## 2016-07-30 NOTE — Telephone Encounter (Signed)
Daughter left message that patient only received a 15 day supply of Imbruvica.   Had been written for # 90 capsules in original prescription with 6 refills. # 30 capsules was all that was left to be filled. New prescription was sent to [facility].  Pt notified that he can pick up a 30 day supply in 2 weeks

## 2016-07-30 NOTE — Telephone Encounter (Signed)
Oral Chemotherapy Pharmacist Encounter  Received call from triage from patient's daughter, Michael [name], with questions about Imbruvica quantity dispensed from the pharmacy.  I called Michael [name], all questions answered. Patient has missed 0 doses of Imbruvica 140mg  capsules, 2 capsules (280mg  total) by mouth once daily. Start date 04/29/15  Michael [name] did express some concerns about Michael [name] current status.  He is experiencing continued back pain, lumbar spine CT performed 07/17/16 showed arthritic and bulging disc changes. PCP has prescribed morphine immediate release 15mg  by mouth every 4-6 hours, which patient is taking that frequently. Michael [name] did endorse some somnolence from Michael father and tries to wait the 6 hours between doses if possible. She states the pain is better on the morphine, but could use additional control. They are also supplementing with Ibuprofen  Patient is moving bowels ~ once per week. He is receiving daily MiraLax, prune juice, and milk of magnesia. They have increased fruits and vegetables in patient's diet.  Patient is experiencing increased leg edema that waxes and wanes as he puts his feet up. He is taking his diuretic twice daily with some relief. Last CMET in March 2018, may need repeat labs with Ibuprofen and increased diuretic use.   Patient is sleeping in a kitchen chair at night due to back pain and edema.  Next MD appointment in September. Michael [name] requested to be seen in office within next few weeks as she thinks Michael [name] status is declining. They need Monday or Wednesday appointments due to their mother's dialysis schedule.  Note will be forwarded to MD. Oral oncology Clinic will continue to follow.  Michael [name], PharmD, BCPS, BCOP 07/30/2016  1:08 PM Oral Oncology Clinic [phone]

## 2016-07-31 NOTE — Telephone Encounter (Signed)
I will place scheduling msg to see him on 7/16

## 2016-08-06 ENCOUNTER — Telehealth: Payer: Self-pay | Admitting: Hematology and Oncology

## 2016-08-06 NOTE — Telephone Encounter (Signed)
lvm to inform pt of 7/16 appt at 1 pm per sch msg

## 2016-08-07 MED FILL — IMBRUVICA 140 MG CAPSULE: 140 | 30 days supply | Qty: 60 | Fill #0

## 2016-08-08 ENCOUNTER — Other Ambulatory Visit: Payer: Self-pay | Admitting: Hematology and Oncology

## 2016-08-08 DIAGNOSIS — C9110 Chronic lymphocytic leukemia of B-cell type not having achieved remission: Secondary | ICD-10-CM

## 2016-08-10 ENCOUNTER — Other Ambulatory Visit (HOSPITAL_BASED_OUTPATIENT_CLINIC_OR_DEPARTMENT_OTHER): Payer: Medicare HMO

## 2016-08-10 ENCOUNTER — Ambulatory Visit (HOSPITAL_BASED_OUTPATIENT_CLINIC_OR_DEPARTMENT_OTHER): Payer: Medicare HMO | Admitting: Hematology and Oncology

## 2016-08-10 DIAGNOSIS — C9110 Chronic lymphocytic leukemia of B-cell type not having achieved remission: Secondary | ICD-10-CM

## 2016-08-10 DIAGNOSIS — D630 Anemia in neoplastic disease: Secondary | ICD-10-CM

## 2016-08-10 DIAGNOSIS — R600 Localized edema: Secondary | ICD-10-CM

## 2016-08-10 LAB — CBC WITH DIFFERENTIAL/PLATELET
BASO%: 0.3 % (ref 0.0–2.0)
BASOS ABS: 0 10*3/uL (ref 0.0–0.1)
EOS%: 2.1 % (ref 0.0–7.0)
Eosinophils Absolute: 0.1 10*3/uL (ref 0.0–0.5)
HEMATOCRIT: 32.9 % — AB (ref 38.4–49.9)
HEMOGLOBIN: 10.3 g/dL — AB (ref 13.0–17.1)
LYMPH#: 2.7 10*3/uL (ref 0.9–3.3)
LYMPH%: 40.6 % (ref 14.0–49.0)
MCH: 26.6 pg — AB (ref 27.2–33.4)
MCHC: 31.3 g/dL — AB (ref 32.0–36.0)
MCV: 85 fL (ref 79.3–98.0)
MONO#: 0.5 10*3/uL (ref 0.1–0.9)
MONO%: 6.8 % (ref 0.0–14.0)
NEUT#: 3.4 10*3/uL (ref 1.5–6.5)
NEUT%: 50.2 % (ref 39.0–75.0)
Platelets: 245 10*3/uL (ref 140–400)
RBC: 3.87 10*6/uL — ABNORMAL LOW (ref 4.20–5.82)
RDW: 16.1 % — AB (ref 11.0–14.6)
WBC: 6.8 10*3/uL (ref 4.0–10.3)

## 2016-08-10 LAB — COMPREHENSIVE METABOLIC PANEL
ALBUMIN: 2.8 g/dL — AB (ref 3.5–5.0)
ALK PHOS: 46 U/L (ref 40–150)
ALT: 10 U/L (ref 0–55)
ANION GAP: 9 meq/L (ref 3–11)
AST: 15 U/L (ref 5–34)
BILIRUBIN TOTAL: 0.26 mg/dL (ref 0.20–1.20)
BUN: 18 mg/dL (ref 7.0–26.0)
CALCIUM: 8.8 mg/dL (ref 8.4–10.4)
CO2: 28 meq/L (ref 22–29)
CREATININE: 1.2 mg/dL (ref 0.7–1.3)
Chloride: 104 mEq/L (ref 98–109)
EGFR: 66 mL/min/{1.73_m2} — AB (ref >90)
Glucose: 114 mg/dl (ref 70–140)
Potassium: 4.5 mEq/L (ref 3.5–5.1)
Sodium: 141 mEq/L (ref 136–145)
TOTAL PROTEIN: 5.7 g/dL — AB (ref 6.4–8.3)

## 2016-08-12 ENCOUNTER — Encounter: Payer: Self-pay | Admitting: Hematology and Oncology

## 2016-08-12 NOTE — Progress Notes (Signed)
[place] OFFICE PROGRESS NOTE  Patient Care Team: Anda Kraft, MD as PCP - General (Endocrinology) Heath Lark, MD as Consulting Physician (Hematology and Oncology)  SUMMARY OF ONCOLOGIC HISTORY:   CLL (chronic lymphocytic leukemia) ([place])   04/03/2008 Pathology Results    Case #: [id number] FLow cytomtry confirmed CLL      10/11/2014 Tumor Marker    FISH analysis showed trisomy 12 abnormality      04/29/2015 -  Chemotherapy    He started taking Ibrutinib      04/29/2015 Imaging    Ct scan showed mild lymphadenopathy and splenomegaly      05/13/2015 Adverse Reaction    Due to neutropenia, dose of Ibrutinib is reduced to 280 mg daily      01/08/2016 Imaging    Response to therapy. Resolution of thoracic and abdominal adenopathy. Resolution of splenomegaly. 2. No new or progressive disease. 3. Small bowel containing left inguinal hernia and right-sided hydrocele, as before. 4. Esophageal air fluid level suggests dysmotility or gastroesophageal reflux. 5. Possible constipation. 6. Suspect hepatic hemangiomas, similar.       INTERVAL HISTORY: Please see below for problem oriented charting. The patient was seen earlier than his anticipated appointment because of concern from his family member. His wife is weak.  He recently tripped when he tried to catch his wife from falling.  He has significant bruising from the fall.  He denies other problems.  No infection.  Denies dizziness or headache.  REVIEW OF SYSTEMS:   Constitutional: Denies fevers, chills or abnormal weight loss Eyes: Denies blurriness of vision Ears, nose, mouth, throat, and face: Denies mucositis or sore throat Respiratory: Denies cough, dyspnea or wheezes Cardiovascular: Denies palpitation, chest discomfort  Gastrointestinal:  Denies nausea, heartburn or change in bowel habits Skin: Denies abnormal skin rashes Lymphatics: Denies new lymphadenopathy  Neurological:Denies numbness, tingling or new  weaknesses Behavioral/Psych: Mood is stable, no new changes  All other systems were reviewed with the patient and are negative.  I have reviewed the past medical history, past surgical history, social history and family history with the patient and they are unchanged from previous note.  ALLERGIES:  has No Known Allergies.  MEDICATIONS:  Current Outpatient Prescriptions  Medication Sig Dispense Refill  . aspirin 325 MG tablet Take 325 mg by mouth daily.      Marland Kitchen atropine-PHENObarbital-scopolamine-hyoscyamine (DONNATAL) 16.2 MG tablet Take 1 tablet by mouth every 8 (eight) hours as needed for cramping.     . ibrutinib (IMBRUVICA) 140 MG capsul Take 2 capsules (280 mg total) by mouth daily. 60 capsule 6  . insulin NPH-insulin regular (NOVOLIN 70/30) (70-30) 100 UNIT/ML injection Inject 20 Units into the skin 2 (two) times daily with a meal. Sliding scale    . metFORMIN (GLUCOPHAGE) 1000 MG tablet Take 1,000 mg by mouth 2 (two) times daily with a meal.     . Multiple Vitamin (MULTIVITAMIN WITH MINERALS) TABS tablet Take 1 tablet by mouth daily.    Marland Kitchen oxyCODONE-acetaminophen (PERCOCET/ROXICET) 5-325 MG tablet Take 1 tablet by mouth every 4 (four) hours as needed for severe pain. 12 tablet 0  . pantoprazole (PROTONIX) 40 MG tablet Take 1 tablet (40 mg total) by mouth daily. 30 tablet 11  . ramipril (ALTACE) 5 MG tablet Take 5 mg by mouth daily.    . Tamsulosin HCl (FLOMAX) 0.4 MG CAPS Take 0.4 mg by mouth daily after supper.     . torsemide (DEMADEX) 20 MG tablet Take 20 mg by  mouth daily.       No current facility-administered medications for this visit.     PHYSICAL EXAMINATION: ECOG PERFORMANCE STATUS: 1 - Symptomatic but completely ambulatory  Vitals:   08/10/16 1302  BP: (!) 126/45  Pulse: 73  Resp: 18  Temp: 99 F (37.2 C)   Filed Weights   08/10/16 1302  Weight: 187 lb 6.4 oz (85 kg)    GENERAL:alert, no distress and comfortable SKIN: Noted skin bruising. EYES: normal,  Conjunctiva are pink and non-injected, sclera clear OROPHARYNX:no exudate, no erythema and lips, buccal mucosa, and tongue normal  NECK: supple, thyroid normal size, non-tender, without nodularity LYMPH:  no palpable lymphadenopathy in the cervical, axillary or inguinal LUNGS: clear to auscultation and percussion with normal breathing effort HEART: regular rate & rhythm and no murmurs and no lower extremity edema ABDOMEN:abdomen soft, non-tender and normal bowel sounds Musculoskeletal:no cyanosis of digits and no clubbing  NEURO: alert & oriented x 3 with fluent speech, no focal motor/sensory deficits  LABORATORY DATA:  I have reviewed the data as listed    Component Value Date/Time   NA 141 08/10/2016 1248   K 4.5 08/10/2016 1248   CL 102 03/04/2016 1155   CL 104 03/28/2012 1426   CO2 28 08/10/2016 1248   GLUCOSE 114 08/10/2016 1248   GLUCOSE 171 (H) 03/28/2012 1426   BUN 18.0 08/10/2016 1248   CREATININE 1.2 08/10/2016 1248   CALCIUM 8.8 08/10/2016 1248   PROT 5.7 (L) 08/10/2016 1248   ALBUMIN 2.8 (L) 08/10/2016 1248   AST 15 08/10/2016 1248   ALT 10 08/10/2016 1248   ALKPHOS 46 08/10/2016 1248   BILITOT 0.26 08/10/2016 1248   GFRNONAA >60 03/04/2016 1155   GFRAA >60 03/04/2016 1155    No results found for: SPEP, UPEP  Lab Results  Component Value Date   WBC 6.8 08/10/2016   NEUTROABS 3.4 08/10/2016   HGB 10.3 (L) 08/10/2016   HCT 32.9 (L) 08/10/2016   MCV 85.0 08/10/2016   PLT 245 08/10/2016      Chemistry      Component Value Date/Time   NA 141 08/10/2016 1248   K 4.5 08/10/2016 1248   CL 102 03/04/2016 1155   CL 104 03/28/2012 1426   CO2 28 08/10/2016 1248   BUN 18.0 08/10/2016 1248   CREATININE 1.2 08/10/2016 1248      Component Value Date/Time   CALCIUM 8.8 08/10/2016 1248   ALKPHOS 46 08/10/2016 1248   AST 15 08/10/2016 1248   ALT 10 08/10/2016 1248   BILITOT 0.26 08/10/2016 1248       RADIOGRAPHIC STUDIES: I have personally reviewed the  radiological images as listed and agreed with the findings in the report. Ct Lumbar Spine Wo Contrast  Result Date: 07/17/2016 CLINICAL DATA:  Lumbar disc degeneration. Low back pain located in the midline and right. Right leg and hip pain. Weakness in the right leg. Symptoms for 3 weeks. History of chronic lymphoblastic leukemia. EXAM: CT LUMBAR SPINE WITHOUT CONTRAST TECHNIQUE: Multidetector CT imaging of the lumbar spine was performed without intravenous contrast administration. Multiplanar CT image reconstructions were also generated. COMPARISON:  Lumbar spine radiographs 07/04/2016. CT abdomen and pelvis 01/08/2016. FINDINGS: Segmentation: 5 lumbar type vertebrae. Alignment: Facet mediated anterolisthesis of L4 on L5 measuring 5 mm, unchanged from the prior CT. Trace retrolisthesis of T12 on L1 and L1 on L2. Vertebrae: Preserved vertebral body heights without evidence of fracture. No L5 pars defects as were questioned on recent radiographs.  Paraspinal and other soft tissues: Partially visualized dense material in the alimentary tract at the level the cecum and appendix. Mild abdominal aortic atherosclerosis. Disc levels: T12-L1: Mild disc space narrowing. Mild disc bulging, shallow left paracentral disc protrusion, and mild ligamentum flavum thickening and calcification without evidence of significant stenosis. L1-2:  Negative. L2-3:  Minimal disc bulging without stenosis. L3-4: Mild circumferential disc bulging, ligamentum flavum thickening, and moderate facet arthrosis result in mild spinal stenosis without significant neural foraminal stenosis. L4-5: Mild disc space narrowing. Anterolisthesis with bulging uncovered disc and severe facet arthrosis result in mild spinal stenosis, right lateral recess stenosis, and mild bilateral neural foraminal stenosis. Associated facet erosions are present and have progressed from 12/20 17. L5-S1: Mild disc bulging and severe right and mild left facet arthrosis result in  mild right neural foraminal stenosis. There is a shallow left paracentral disc protrusion without significant spinal or lateral recess stenosis. IMPRESSION: 1. Severe L4-5 facet arthropathy with grade 1 anterolisthesis, mild spinal stenosis, right lateral recess stenosis, and mild foraminal stenosis. 2. Severe right L5-S1 facet arthrosis. 3. Mild multifactorial spinal stenosis at L3-4. Electronically Signed   By: Logan Bores M.D.   On: 07/17/2016 15:49    ASSESSMENT & PLAN:  CLL (chronic lymphocytic leukemia) He tolerated treatment very well with excellent response to treatment with minimum side effects apart from fatigue and mild pancytopenia I have reduced dose of Ibrutinib to 280 mg daily and he tolerated better I recommend we continue to same and I plan to see him back in September with blood work, history, CT scan and physical examination The plan would be to continue ibrutinib for at least 2 years, possibly indefinitely  Anemia in neoplastic disease This is likely anemia of chronic disease. The patient denies recent history of bleeding such as epistaxis, hematuria or hematochezia. He is asymptomatic from the anemia. We will observe for now.   Bilateral leg edema Likely related to mild protein calorie malnutrition with mildly reduced low albumin. Recommend observation only. He is on low dose diuretic   No orders of the defined types were placed in this encounter.  All questions were answered. The patient knows to call the clinic with any problems, questions or concerns. No barriers to learning was detected. I spent 15 minutes counseling the patient face to face. The total time spent in the appointment was 20 minutes and more than 50% was on counseling and review of test results     Heath Lark, MD 08/12/2016 4:21 PM

## 2016-08-12 NOTE — Assessment & Plan Note (Signed)
Likely related to mild protein calorie malnutrition with mildly reduced low albumin. Recommend observation only. He is on low dose diuretic

## 2016-08-12 NOTE — Assessment & Plan Note (Addendum)
He tolerated treatment very well with excellent response to treatment with minimum side effects apart from fatigue and mild pancytopenia I have reduced dose of Ibrutinib to 280 mg daily and he tolerated better I recommend we continue to same and I plan to see him back in September with blood work, history, CT scan and physical examination The plan would be to continue ibrutinib for at least 2 years, possibly indefinitely

## 2016-08-12 NOTE — Assessment & Plan Note (Signed)
This is likely anemia of chronic disease. The patient denies recent history of bleeding such as epistaxis, hematuria or hematochezia. He is asymptomatic from the anemia. We will observe for now.  

## 2016-08-14 DIAGNOSIS — R69 Illness, unspecified: Secondary | ICD-10-CM | POA: Diagnosis not present

## 2016-08-17 DIAGNOSIS — H35371 Puckering of macula, right eye: Secondary | ICD-10-CM | POA: Diagnosis not present

## 2016-08-17 DIAGNOSIS — H31009 Unspecified chorioretinal scars, unspecified eye: Secondary | ICD-10-CM | POA: Diagnosis not present

## 2016-08-17 DIAGNOSIS — H35043 Retinal micro-aneurysms, unspecified, bilateral: Secondary | ICD-10-CM | POA: Diagnosis not present

## 2016-08-17 DIAGNOSIS — E113553 Type 2 diabetes mellitus with stable proliferative diabetic retinopathy, bilateral: Secondary | ICD-10-CM | POA: Diagnosis not present

## 2016-08-31 DIAGNOSIS — G894 Chronic pain syndrome: Secondary | ICD-10-CM | POA: Diagnosis not present

## 2016-08-31 DIAGNOSIS — M5416 Radiculopathy, lumbar region: Secondary | ICD-10-CM | POA: Diagnosis not present

## 2016-08-31 DIAGNOSIS — M5136 Other intervertebral disc degeneration, lumbar region: Secondary | ICD-10-CM | POA: Diagnosis not present

## 2016-09-21 MED FILL — IMBRUVICA 140 MG CAPSULE: 140 | 30 days supply | Qty: 60 | Fill #1

## 2016-10-08 DIAGNOSIS — R69 Illness, unspecified: Secondary | ICD-10-CM | POA: Diagnosis not present

## 2016-10-09 ENCOUNTER — Other Ambulatory Visit (HOSPITAL_BASED_OUTPATIENT_CLINIC_OR_DEPARTMENT_OTHER): Payer: Medicare HMO

## 2016-10-09 ENCOUNTER — Ambulatory Visit (HOSPITAL_COMMUNITY)
Admission: RE | Admit: 2016-10-09 | Discharge: 2016-10-09 | Disposition: A | Discharge status: Home / Self Care | Payer: Medicare HMO | Source: Ambulatory Visit | Attending: Hematology and Oncology | Admitting: Hematology and Oncology

## 2016-10-09 ENCOUNTER — Encounter (HOSPITAL_COMMUNITY): Payer: Self-pay

## 2016-10-09 DIAGNOSIS — K4090 Unilateral inguinal hernia, without obstruction or gangrene, not specified as recurrent: Secondary | ICD-10-CM | POA: Diagnosis not present

## 2016-10-09 DIAGNOSIS — C9110 Chronic lymphocytic leukemia of B-cell type not having achieved remission: Secondary | ICD-10-CM

## 2016-10-09 DIAGNOSIS — C9190 Lymphoid leukemia, unspecified not having achieved remission: Secondary | ICD-10-CM | POA: Insufficient documentation

## 2016-10-09 LAB — COMPREHENSIVE METABOLIC PANEL
ALT: 8 U/L (ref 0–55)
ANION GAP: 7 meq/L (ref 3–11)
AST: 17 U/L (ref 5–34)
Albumin: 3.1 g/dL — ABNORMAL LOW (ref 3.5–5.0)
Alkaline Phosphatase: 46 U/L (ref 40–150)
BILIRUBIN TOTAL: 0.23 mg/dL (ref 0.20–1.20)
BUN: 20.8 mg/dL (ref 7.0–26.0)
CALCIUM: 9.1 mg/dL (ref 8.4–10.4)
CHLORIDE: 106 meq/L (ref 98–109)
CO2: 28 mEq/L (ref 22–29)
CREATININE: 1.1 mg/dL (ref 0.7–1.3)
EGFR: 69 mL/min/{1.73_m2} — ABNORMAL LOW (ref >90)
Glucose: 127 mg/dl (ref 70–140)
Potassium: 4.7 mEq/L (ref 3.5–5.1)
Sodium: 140 mEq/L (ref 136–145)
TOTAL PROTEIN: 5.8 g/dL — AB (ref 6.4–8.3)

## 2016-10-09 LAB — LACTATE DEHYDROGENASE: LDH: 158 U/L (ref 125–245)

## 2016-10-09 LAB — CBC WITH DIFFERENTIAL/PLATELET
BASO%: 0.3 % (ref 0.0–2.0)
Basophils Absolute: 0 10*3/uL (ref 0.0–0.1)
EOS%: 1.4 % (ref 0.0–7.0)
Eosinophils Absolute: 0.1 10*3/uL (ref 0.0–0.5)
HCT: 34.7 % — ABNORMAL LOW (ref 38.4–49.9)
HGB: 11 g/dL — ABNORMAL LOW (ref 13.0–17.1)
LYMPH%: 36.7 % (ref 14.0–49.0)
MCH: 27.1 pg — ABNORMAL LOW (ref 27.2–33.4)
MCHC: 31.7 g/dL — ABNORMAL LOW (ref 32.0–36.0)
MCV: 85.5 fL (ref 79.3–98.0)
MONO#: 0.3 10*3/uL (ref 0.1–0.9)
MONO%: 4.2 % (ref 0.0–14.0)
NEUT%: 57.4 % (ref 39.0–75.0)
NEUTROS ABS: 4 10*3/uL (ref 1.5–6.5)
Platelets: 200 10*3/uL (ref 140–400)
RBC: 4.06 10*6/uL — ABNORMAL LOW (ref 4.20–5.82)
RDW: 17.8 % — AB (ref 11.0–14.6)
WBC: 6.9 10*3/uL (ref 4.0–10.3)
lymph#: 2.5 10*3/uL (ref 0.9–3.3)

## 2016-10-09 MED ORDER — IOPAMIDOL (ISOVUE-300) INJECTION 61%
INTRAVENOUS | Status: AC
  Filled 2016-10-09: qty 100

## 2016-10-09 MED ORDER — IOPAMIDOL (ISOVUE-300) INJECTION 61%
100.0000 mL | Freq: Once | INTRAVENOUS | Status: AC | PRN
  Administered 2016-10-09: 100 mL via INTRAVENOUS

## 2016-10-12 ENCOUNTER — Ambulatory Visit (HOSPITAL_BASED_OUTPATIENT_CLINIC_OR_DEPARTMENT_OTHER): Payer: Medicare HMO | Admitting: Hematology and Oncology

## 2016-10-12 ENCOUNTER — Encounter: Payer: Self-pay | Admitting: Hematology and Oncology

## 2016-10-12 ENCOUNTER — Telehealth: Payer: Self-pay | Admitting: Hematology and Oncology

## 2016-10-12 DIAGNOSIS — C9110 Chronic lymphocytic leukemia of B-cell type not having achieved remission: Secondary | ICD-10-CM

## 2016-10-12 DIAGNOSIS — K4090 Unilateral inguinal hernia, without obstruction or gangrene, not specified as recurrent: Secondary | ICD-10-CM | POA: Diagnosis not present

## 2016-10-12 DIAGNOSIS — D630 Anemia in neoplastic disease: Secondary | ICD-10-CM | POA: Diagnosis not present

## 2016-10-12 NOTE — Progress Notes (Signed)
[place] OFFICE PROGRESS NOTE  Patient Care Team: Anda Kraft, MD as PCP - General (Endocrinology) Heath Lark, MD as Consulting Physician (Hematology and Oncology)  SUMMARY OF ONCOLOGIC HISTORY:   CLL (chronic lymphocytic leukemia) ([place])   04/03/2008 Pathology Results    Case #: [id number] FLow cytomtry confirmed CLL      10/11/2014 Tumor Marker    FISH analysis showed trisomy 12 abnormality      04/29/2015 -  Chemotherapy    He started taking Ibrutinib      04/29/2015 Imaging    Ct scan showed mild lymphadenopathy and splenomegaly      05/13/2015 Adverse Reaction    Due to neutropenia, dose of Ibrutinib is reduced to 280 mg daily      01/08/2016 Imaging    Response to therapy. Resolution of thoracic and abdominal adenopathy. Resolution of splenomegaly. 2. No new or progressive disease. 3. Small bowel containing left inguinal hernia and right-sided hydrocele, as before. 4. Esophageal air fluid level suggests dysmotility or gastroesophageal reflux. 5. Possible constipation. 6. Suspect hepatic hemangiomas, similar.      10/09/2016 Imaging    No findings suspicious for active lymphomatous involvement.  No abdominopelvic lymphadenopathy. Spleen is normal in size.  Moderate to large left inguinal/scrotal hernia containing small bowel and fat. No evidence of bowel obstruction.  Additional stable ancillary findings as above.       INTERVAL HISTORY: Please see below for problem oriented charting. He returns for further follow-up He feels well Denies chest pain or shortness of breath Denies symptoms of pain at the left inguinal sites No recent bruising, diarrhea or dizziness with reduced dose ibrutinib  REVIEW OF SYSTEMS:   Constitutional: Denies fevers, chills or abnormal weight loss Eyes: Denies blurriness of vision Ears, nose, mouth, throat, and face: Denies mucositis or sore throat Respiratory: Denies cough, dyspnea or wheezes Cardiovascular: Denies  palpitation, chest discomfort or lower extremity swelling Gastrointestinal:  Denies nausea, heartburn or change in bowel habits Skin: Denies abnormal skin rashes Lymphatics: Denies new lymphadenopathy or easy bruising Neurological:Denies numbness, tingling or new weaknesses Behavioral/Psych: Mood is stable, no new changes  All other systems were reviewed with the patient and are negative.  I have reviewed the past medical history, past surgical history, social history and family history with the patient and they are unchanged from previous note.  ALLERGIES:  has No Known Allergies.  MEDICATIONS:  Current Outpatient Prescriptions  Medication Sig Dispense Refill  . aspirin 325 MG tablet Take 325 mg by mouth daily.      Marland Kitchen atropine-PHENObarbital-scopolamine-hyoscyamine (DONNATAL) 16.2 MG tablet Take 1 tablet by mouth every 8 (eight) hours as needed for cramping.     . ibrutinib (IMBRUVICA) 140 MG capsul Take 2 capsules (280 mg total) by mouth daily. 60 capsule 6  . insulin NPH-insulin regular (NOVOLIN 70/30) (70-30) 100 UNIT/ML injection Inject 20 Units into the skin 2 (two) times daily with a meal. Sliding scale    . metFORMIN (GLUCOPHAGE) 1000 MG tablet Take 1,000 mg by mouth 2 (two) times daily with a meal.     . Multiple Vitamin (MULTIVITAMIN WITH MINERALS) TABS tablet Take 1 tablet by mouth daily.    Marland Kitchen oxyCODONE-acetaminophen (PERCOCET/ROXICET) 5-325 MG tablet Take 1 tablet by mouth every 4 (four) hours as needed for severe pain. 12 tablet 0  . pantoprazole (PROTONIX) 40 MG tablet Take 1 tablet (40 mg total) by mouth daily. 30 tablet 11  . ramipril (ALTACE) 5 MG tablet Take 5 mg  by mouth daily.    . Tamsulosin HCl (FLOMAX) 0.4 MG CAPS Take 0.4 mg by mouth daily after supper.     . torsemide (DEMADEX) 20 MG tablet Take 20 mg by mouth daily.       No current facility-administered medications for this visit.     PHYSICAL EXAMINATION: ECOG PERFORMANCE STATUS: 0 - Asymptomatic  Vitals:    10/12/16 1005  BP: (!) 131/43  Pulse: 70  Resp: 17  Temp: 98.6 F (37 C)  SpO2: 98%   Filed Weights   10/12/16 1005  Weight: 193 lb 12.8 oz (87.9 kg)    GENERAL:alert, no distress and comfortable SKIN: skin color, texture, turgor are normal, no rashes or significant lesions EYES: normal, Conjunctiva are pink and non-injected, sclera clear OROPHARYNX:no exudate, no erythema and lips, buccal mucosa, and tongue normal  NECK: supple, thyroid normal size, non-tender, without nodularity LYMPH:  no palpable lymphadenopathy in the cervical, axillary or inguinal LUNGS: clear to auscultation and percussion with normal breathing effort HEART: regular rate & rhythm and no murmurs and no lower extremity edema ABDOMEN:abdomen soft, non-tender and normal bowel sounds.  He has large palpable inguinal hernia Musculoskeletal:no cyanosis of digits and no clubbing  NEURO: alert & oriented x 3 with fluent speech, no focal motor/sensory deficits  LABORATORY DATA:  I have reviewed the data as listed    Component Value Date/Time   NA 140 10/09/2016 1106   K 4.7 10/09/2016 1106   CL 102 03/04/2016 1155   CL 104 03/28/2012 1426   CO2 28 10/09/2016 1106   GLUCOSE 127 10/09/2016 1106   GLUCOSE 171 (H) 03/28/2012 1426   BUN 20.8 10/09/2016 1106   CREATININE 1.1 10/09/2016 1106   CALCIUM 9.1 10/09/2016 1106   PROT 5.8 (L) 10/09/2016 1106   ALBUMIN 3.1 (L) 10/09/2016 1106   AST 17 10/09/2016 1106   ALT 8 10/09/2016 1106   ALKPHOS 46 10/09/2016 1106   BILITOT 0.23 10/09/2016 1106   GFRNONAA >60 03/04/2016 1155   GFRAA >60 03/04/2016 1155    No results found for: SPEP, UPEP  Lab Results  Component Value Date   WBC 6.9 10/09/2016   NEUTROABS 4.0 10/09/2016   HGB 11.0 (L) 10/09/2016   HCT 34.7 (L) 10/09/2016   MCV 85.5 10/09/2016   PLT 200 10/09/2016      Chemistry      Component Value Date/Time   NA 140 10/09/2016 1106   K 4.7 10/09/2016 1106   CL 102 03/04/2016 1155   CL 104  03/28/2012 1426   CO2 28 10/09/2016 1106   BUN 20.8 10/09/2016 1106   CREATININE 1.1 10/09/2016 1106      Component Value Date/Time   CALCIUM 9.1 10/09/2016 1106   ALKPHOS 46 10/09/2016 1106   AST 17 10/09/2016 1106   ALT 8 10/09/2016 1106   BILITOT 0.23 10/09/2016 1106       RADIOGRAPHIC STUDIES: I have reviewed imaging study with the patient I have personally reviewed the radiological images as listed and agreed with the findings in the report. Ct Abdomen Pelvis W Contrast  Result Date: 10/09/2016 CLINICAL DATA:  Follow-up CLL, Imbruvica ongoing EXAM: CT ABDOMEN AND PELVIS WITH CONTRAST TECHNIQUE: Multidetector CT imaging of the abdomen and pelvis was performed using the standard protocol following bolus administration of intravenous contrast. CONTRAST:  163mL ISOVUE-300 IOPAMIDOL (ISOVUE-300) INJECTION 61% COMPARISON:  CT chest abdomen pelvis dated 01/08/2016 FINDINGS: Lower chest: Calcified granuloma in the left lower lobe (series 6/ image 17),  benign. Hepatobiliary: 1.7 cm benign hemangioma in the posterior segment right hepatic lobe (series 2/ image 24). Additional 8 mm cyst versus hemangioma in the posterior right hepatic dome (series 2/image 15). Gallbladder is unremarkable. No intrahepatic or extrahepatic ductal dilatation. Pancreas: Within normal limits. Spleen: Spleen is normal size. Adrenals/Urinary Tract: Adrenal glands are within normal limits. Small bilateral renal cysts measuring up to 14 mm in the right upper kidney (series 2/image 28). No hydronephrosis. Bladder is mildly thick-walled although underdistended. Stomach/Bowel: Stomach is within normal limits. No evidence of bowel obstruction. Appendix is not discretely visualized. Mild to moderate colonic stool burden. Vascular/Lymphatic: No evidence of abdominal aortic aneurysm. Mild atherosclerotic calcifications the abdominal aorta. No suspicious abdominopelvic lymphadenopathy. Reproductive: Prostate is unremarkable. Other: No  abdominopelvic ascites. Moderate to large left inguinal/ scrotal hernia containing multiple loops of small bowel and fat (series 2/image 87). Tiny fat containing right inguinal hernia (series 2/image 75). Musculoskeletal: Mild degenerative changes of the visualized thoracolumbar spine. IMPRESSION: No findings suspicious for active lymphomatous involvement. No abdominopelvic lymphadenopathy.  Spleen is normal in size. Moderate to large left inguinal/scrotal hernia containing small bowel and fat. No evidence of bowel obstruction. Additional stable ancillary findings as above. Electronically Signed   By: Julian Hy M.D.   On: 10/09/2016 14:19    ASSESSMENT & PLAN:  CLL (chronic lymphocytic leukemia) He is doing well and tolerated treatment well CT scan show complete response to treatment We will continue on reduced dose ibrutinib indefinitely I will see him back in 6 months with history, physical examination and blood work  Left inguinal hernia He has large inguinal hernia but remain asymptomatic We discussed the risk and benefit of surgery referral to general surgery The patient's decline surgical evaluation right now  Anemia in neoplastic disease This is likely anemia of chronic disease. The patient denies recent history of bleeding such as epistaxis, hematuria or hematochezia. He is asymptomatic from the anemia. We will observe for now.    No orders of the defined types were placed in this encounter.  All questions were answered. The patient knows to call the clinic with any problems, questions or concerns. No barriers to learning was detected. I spent 15 minutes counseling the patient face to face. The total time spent in the appointment was 20 minutes and more than 50% was on counseling and review of test results     Heath Lark, MD 10/12/2016 11:29 AM

## 2016-10-12 NOTE — Assessment & Plan Note (Signed)
This is likely anemia of chronic disease. The patient denies recent history of bleeding such as epistaxis, hematuria or hematochezia. He is asymptomatic from the anemia. We will observe for now.  

## 2016-10-12 NOTE — Telephone Encounter (Signed)
Gave avs and calendar for march 2019 

## 2016-10-12 NOTE — Assessment & Plan Note (Signed)
He is doing well and tolerated treatment well CT scan show complete response to treatment We will continue on reduced dose ibrutinib indefinitely I will see him back in 6 months with history, physical examination and blood work

## 2016-10-12 NOTE — Assessment & Plan Note (Signed)
He has large inguinal hernia but remain asymptomatic We discussed the risk and benefit of surgery referral to general surgery The patient's decline surgical evaluation right now

## 2016-10-14 MED FILL — IMBRUVICA 140 MG CAPSULE: 140 | 30 days supply | Qty: 60 | Fill #2

## 2016-10-16 ENCOUNTER — Other Ambulatory Visit: Payer: Self-pay | Admitting: Pharmacist

## 2016-10-19 ENCOUNTER — Encounter (HOSPITAL_COMMUNITY): Payer: Self-pay | Admitting: *Deleted

## 2016-10-19 ENCOUNTER — Observation Stay (HOSPITAL_COMMUNITY): Payer: Medicare HMO

## 2016-10-19 ENCOUNTER — Observation Stay (HOSPITAL_COMMUNITY)
Admission: EM | Admit: 2016-10-19 | Discharge: 2016-10-21 | Disposition: A | Discharge status: Home / Self Care | Payer: Medicare HMO | Attending: Internal Medicine | Admitting: Internal Medicine

## 2016-10-19 ENCOUNTER — Emergency Department (HOSPITAL_COMMUNITY): Payer: Medicare HMO

## 2016-10-19 DIAGNOSIS — I700 Atherosclerosis of aorta: Secondary | ICD-10-CM | POA: Insufficient documentation

## 2016-10-19 DIAGNOSIS — Z794 Long term (current) use of insulin: Secondary | ICD-10-CM | POA: Diagnosis not present

## 2016-10-19 DIAGNOSIS — R531 Weakness: Secondary | ICD-10-CM | POA: Insufficient documentation

## 2016-10-19 DIAGNOSIS — R600 Localized edema: Secondary | ICD-10-CM | POA: Diagnosis not present

## 2016-10-19 DIAGNOSIS — R202 Paresthesia of skin: Secondary | ICD-10-CM | POA: Diagnosis not present

## 2016-10-19 DIAGNOSIS — I672 Cerebral atherosclerosis: Secondary | ICD-10-CM | POA: Diagnosis not present

## 2016-10-19 DIAGNOSIS — I10 Essential (primary) hypertension: Secondary | ICD-10-CM | POA: Diagnosis present

## 2016-10-19 DIAGNOSIS — R079 Chest pain, unspecified: Secondary | ICD-10-CM | POA: Insufficient documentation

## 2016-10-19 DIAGNOSIS — Z9221 Personal history of antineoplastic chemotherapy: Secondary | ICD-10-CM | POA: Insufficient documentation

## 2016-10-19 DIAGNOSIS — G8929 Other chronic pain: Secondary | ICD-10-CM | POA: Diagnosis not present

## 2016-10-19 DIAGNOSIS — M542 Cervicalgia: Secondary | ICD-10-CM | POA: Insufficient documentation

## 2016-10-19 DIAGNOSIS — I081 Rheumatic disorders of both mitral and tricuspid valves: Secondary | ICD-10-CM | POA: Insufficient documentation

## 2016-10-19 DIAGNOSIS — M25511 Pain in right shoulder: Secondary | ICD-10-CM | POA: Insufficient documentation

## 2016-10-19 DIAGNOSIS — D630 Anemia in neoplastic disease: Secondary | ICD-10-CM | POA: Diagnosis not present

## 2016-10-19 DIAGNOSIS — C9110 Chronic lymphocytic leukemia of B-cell type not having achieved remission: Secondary | ICD-10-CM | POA: Diagnosis not present

## 2016-10-19 DIAGNOSIS — M1611 Unilateral primary osteoarthritis, right hip: Secondary | ICD-10-CM | POA: Insufficient documentation

## 2016-10-19 DIAGNOSIS — R42 Dizziness and giddiness: Secondary | ICD-10-CM | POA: Insufficient documentation

## 2016-10-19 DIAGNOSIS — R200 Anesthesia of skin: Secondary | ICD-10-CM

## 2016-10-19 DIAGNOSIS — Z7982 Long term (current) use of aspirin: Secondary | ICD-10-CM | POA: Insufficient documentation

## 2016-10-19 DIAGNOSIS — R51 Headache: Secondary | ICD-10-CM | POA: Insufficient documentation

## 2016-10-19 DIAGNOSIS — M4802 Spinal stenosis, cervical region: Secondary | ICD-10-CM | POA: Insufficient documentation

## 2016-10-19 DIAGNOSIS — E11649 Type 2 diabetes mellitus with hypoglycemia without coma: Secondary | ICD-10-CM | POA: Insufficient documentation

## 2016-10-19 DIAGNOSIS — Z8673 Personal history of transient ischemic attack (TIA), and cerebral infarction without residual deficits: Secondary | ICD-10-CM | POA: Insufficient documentation

## 2016-10-19 DIAGNOSIS — Z79899 Other long term (current) drug therapy: Secondary | ICD-10-CM | POA: Diagnosis not present

## 2016-10-19 DIAGNOSIS — M47812 Spondylosis without myelopathy or radiculopathy, cervical region: Secondary | ICD-10-CM | POA: Diagnosis not present

## 2016-10-19 DIAGNOSIS — M7989 Other specified soft tissue disorders: Secondary | ICD-10-CM | POA: Diagnosis not present

## 2016-10-19 DIAGNOSIS — M19011 Primary osteoarthritis, right shoulder: Secondary | ICD-10-CM | POA: Insufficient documentation

## 2016-10-19 DIAGNOSIS — E1142 Type 2 diabetes mellitus with diabetic polyneuropathy: Secondary | ICD-10-CM | POA: Diagnosis not present

## 2016-10-19 DIAGNOSIS — K4090 Unilateral inguinal hernia, without obstruction or gangrene, not specified as recurrent: Secondary | ICD-10-CM | POA: Diagnosis not present

## 2016-10-19 DIAGNOSIS — M25551 Pain in right hip: Secondary | ICD-10-CM | POA: Insufficient documentation

## 2016-10-19 DIAGNOSIS — R001 Bradycardia, unspecified: Secondary | ICD-10-CM | POA: Insufficient documentation

## 2016-10-19 HISTORY — DX: Personal history of other diseases of the digestive system: Z87.19

## 2016-10-19 HISTORY — DX: Type 2 diabetes mellitus without complications: E11.9

## 2016-10-19 HISTORY — DX: Family history of other specified conditions: Z84.89

## 2016-10-19 HISTORY — DX: Gastro-esophageal reflux disease without esophagitis: K21.9

## 2016-10-19 LAB — RAPID URINE DRUG SCREEN, HOSP PERFORMED
Amphetamines: NOT DETECTED
BENZODIAZEPINES: NOT DETECTED
Barbiturates: NOT DETECTED
COCAINE: NOT DETECTED
OPIATES: POSITIVE — AB
Tetrahydrocannabinol: NOT DETECTED

## 2016-10-19 LAB — COMPREHENSIVE METABOLIC PANEL
ALT: 12 U/L — ABNORMAL LOW (ref 17–63)
ANION GAP: 10 (ref 5–15)
AST: 23 U/L (ref 15–41)
Albumin: 3.2 g/dL — ABNORMAL LOW (ref 3.5–5.0)
Alkaline Phosphatase: 40 U/L (ref 38–126)
BUN: 16 mg/dL (ref 6–20)
CHLORIDE: 104 mmol/L (ref 101–111)
CO2: 24 mmol/L (ref 22–32)
Calcium: 8.7 mg/dL — ABNORMAL LOW (ref 8.9–10.3)
Creatinine, Ser: 1.17 mg/dL (ref 0.61–1.24)
GFR, EST NON AFRICAN AMERICAN: 55 mL/min — AB (ref >60)
Glucose, Bld: 107 mg/dL — ABNORMAL HIGH (ref 65–99)
POTASSIUM: 4.1 mmol/L (ref 3.5–5.1)
Sodium: 138 mmol/L (ref 135–145)
Total Bilirubin: 0.5 mg/dL (ref 0.3–1.2)
Total Protein: 5.4 g/dL — ABNORMAL LOW (ref 6.5–8.1)

## 2016-10-19 LAB — CBC
HEMATOCRIT: 33.7 % — AB (ref 39.0–52.0)
HEMOGLOBIN: 10.7 g/dL — AB (ref 13.0–17.0)
MCH: 26.9 pg (ref 26.0–34.0)
MCHC: 31.8 g/dL (ref 30.0–36.0)
MCV: 84.7 fL (ref 78.0–100.0)
Platelets: 212 10*3/uL (ref 150–400)
RBC: 3.98 MIL/uL — ABNORMAL LOW (ref 4.22–5.81)
RDW: 17.6 % — AB (ref 11.5–15.5)
WBC: 7.7 10*3/uL (ref 4.0–10.5)

## 2016-10-19 LAB — I-STAT CHEM 8, ED
BUN: 19 mg/dL (ref 6–20)
CALCIUM ION: 1.15 mmol/L (ref 1.15–1.40)
CREATININE: 1.1 mg/dL (ref 0.61–1.24)
Chloride: 103 mmol/L (ref 101–111)
GLUCOSE: 106 mg/dL — AB (ref 65–99)
HCT: 35 % — ABNORMAL LOW (ref 39.0–52.0)
HEMOGLOBIN: 11.9 g/dL — AB (ref 13.0–17.0)
POTASSIUM: 4.3 mmol/L (ref 3.5–5.1)
Sodium: 140 mmol/L (ref 135–145)
TCO2: 27 mmol/L (ref 22–32)

## 2016-10-19 LAB — URINALYSIS, ROUTINE W REFLEX MICROSCOPIC
Bacteria, UA: NONE SEEN
Bilirubin Urine: NEGATIVE
Glucose, UA: 50 mg/dL — AB
KETONES UR: NEGATIVE mg/dL
LEUKOCYTES UA: NEGATIVE
Nitrite: NEGATIVE
PROTEIN: NEGATIVE mg/dL
Specific Gravity, Urine: 1.013 (ref 1.005–1.030)
WBC UA: NONE SEEN WBC/hpf (ref 0–5)
pH: 6 (ref 5.0–8.0)

## 2016-10-19 LAB — APTT: aPTT: 30 seconds (ref 24–36)

## 2016-10-19 LAB — DIFFERENTIAL
BASOS ABS: 0 10*3/uL (ref 0.0–0.1)
BASOS PCT: 0 %
EOS ABS: 0.1 10*3/uL (ref 0.0–0.7)
Eosinophils Relative: 1 %
LYMPHS ABS: 3.2 10*3/uL (ref 0.7–4.0)
Lymphocytes Relative: 42 %
MONO ABS: 0.4 10*3/uL (ref 0.1–1.0)
MONOS PCT: 5 %
Neutro Abs: 4 10*3/uL (ref 1.7–7.7)
Neutrophils Relative %: 52 %

## 2016-10-19 LAB — PROTIME-INR
INR: 1
Prothrombin Time: 13.1 seconds (ref 11.4–15.2)

## 2016-10-19 LAB — I-STAT TROPONIN, ED: TROPONIN I, POC: 0 ng/mL (ref 0.00–0.08)

## 2016-10-19 LAB — ETHANOL

## 2016-10-19 NOTE — ED Notes (Signed)
Patient transported to MRI 

## 2016-10-19 NOTE — ED Notes (Signed)
Pt states he just went to the bathroom and cant give urine sample right now. Pt asked to push call light when he can go again to obtain urine sample

## 2016-10-19 NOTE — ED Provider Notes (Signed)
[facility] DEPT Provider Note   CSN: [id number] Arrival date & time: 10/19/16  1332     History   Chief Complaint Chief Complaint  Patient presents with  . Numbness    HPI Michael [name] is a 83 y.o. male.  The history is provided by the patient. No language interpreter was used.    Michael [name] is a 83 y.o. male who presents to the Emergency Department complaining of numbness/weakness.  He reports intermittent dizziness for the last several days, one episode of chest pain today. For the last few days he's experienced intermittent numbness and weakness on the right side. Symptoms seem to come and go and are much worse after periods of rest. Symptoms are the worst after getting up in the morning from sleeping at night. This morning it took him several hours to get out of bed because of numbness and inability to move his right arm and leg. Currently his weakness has significantly improved but he does have some ongoing numbness in his right leg.  Past Medical History:  Diagnosis Date  . Arthritis   . cll dx'd 2000   no rx  . CLL (chronic lymphoblastic leukemia)   . Diabetes mellitus   . Leukemia, chronic lymphoid ([place]) 12/08/2010  . Lymphocytosis     Patient Active Problem List   Diagnosis Date Noted  . Paresthesias 10/19/2016  . Left inguinal hernia 10/12/2016  . Protein-calorie malnutrition, mild ([place]) 01/10/2016  . Diarrhea due to drug 06/17/2015  . Chronic left shoulder pain 06/17/2015  . Bilateral leg edema 06/17/2015  . Pancytopenia, acquired ([place]) 04/22/2015  . Thrombocytopenia ([place]) 10/11/2014  . 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 CLL (chronic lymphocytic leukemia) ([place]) 12/08/2010    Past Surgical History:  Procedure Laterality Date  . CATARACT EXTRACTION         Home Medications    Prior to Admission medications   Medication Sig Start Date End Date Taking? Authorizing Provider  aspirin 325 MG tablet Take 325 mg by mouth daily.     Yes  [provider]  ibrutinib (IMBRUVICA) 140 MG capsul Take 2 capsules (280 mg total) by mouth daily. 07/30/16  Yes Gorsuch, Ni, MD  insulin NPH-insulin regular (NOVOLIN 70/30) (70-30) 100 UNIT/ML injection Inject 15-20 Units into the skin 2 (two) times daily as needed (CBG >100). Sliding scale   Yes [provider]  metFORMIN (GLUCOPHAGE) 1000 MG tablet Take 1,000 mg by mouth 2 (two) times daily with a meal.    Yes [provider]  morphine (MSIR) 15 MG tablet Take 15 mg by mouth every 6 (six) hours as needed for severe pain.  10/01/16  Yes [provider]  Multiple Vitamin (MULTIVITAMIN WITH MINERALS) TABS tablet Take 1 tablet by mouth daily.   Yes [provider]  pantoprazole (PROTONIX) 40 MG tablet Take 1 tablet (40 mg total) by mouth daily. 04/10/16  Yes Gorsuch, Ni, MD  ramipril (ALTACE) 5 MG capsule Take 5 mg by mouth daily. 10/16/16  Yes [provider]  Tamsulosin HCl (FLOMAX) 0.4 MG CAPS Take 0.4 mg by mouth daily after supper.    Yes [provider]  torsemide (DEMADEX) 20 MG tablet Take 20 mg by mouth daily after supper.    Yes [provider]  oxyCODONE-acetaminophen (PERCOCET/ROXICET) 5-325 MG tablet Take 1 tablet by mouth every 4 (four) hours as needed for severe pain. Patient not taking: Reported on 10/19/2016 07/04/16   Shary Decamp, PA-C    Family History  Family History  Problem Relation Age of Onset  . Cancer Mother   . Hypertension Father   . Diabetes Father     Social History Social History  Substance Use Topics  . Smoking status: Never Smoker  . Smokeless tobacco: Former Systems developer    Types: Snuff  . Alcohol use No     Allergies   Patient has no known allergies.   Review of Systems Review of Systems  All other systems reviewed and are negative.    Physical Exam Updated Vital Signs BP (!) 146/56   Pulse (!) 59   Temp 98.3 F (36.8 C) (Oral)   Resp (!) 22   SpO2 99%   Physical Exam    Constitutional: He is oriented to person, place, and time. He appears well-developed and well-nourished.  HENT:  Head: Normocephalic and atraumatic.  Cardiovascular: Normal rate and regular rhythm.   No murmur heard. Pulmonary/Chest: Effort normal and breath sounds normal. No respiratory distress.  Abdominal: Soft. There is no tenderness. There is no rebound and no guarding.  Musculoskeletal: He exhibits no tenderness.  2-3+ pitting edema to bilateral lower extremities. Decreased sensation to the light touch in the right lower extremities.  Neurological: He is alert and oriented to person, place, and time.  Skin: Skin is warm and dry.  Psychiatric: He has a normal mood and affect. His behavior is normal.  Nursing note and vitals reviewed.    ED Treatments / Results  Labs (all labs ordered are listed, but only abnormal results are displayed) Labs Reviewed  CBC - Abnormal; Notable for the following:       Result Value   RBC 3.98 (*)    Hemoglobin 10.7 (*)    HCT 33.7 (*)    RDW 17.6 (*)    All other components within normal limits  COMPREHENSIVE METABOLIC PANEL - Abnormal; Notable for the following:    Glucose, Bld 107 (*)    Calcium 8.7 (*)    Total Protein 5.4 (*)    Albumin 3.2 (*)    ALT 12 (*)    GFR calc non Af Amer 55 (*)    All other components within normal limits  RAPID URINE DRUG SCREEN, HOSP PERFORMED - Abnormal; Notable for the following:    Opiates POSITIVE (*)    All other components within normal limits  URINALYSIS, ROUTINE W REFLEX MICROSCOPIC - Abnormal; Notable for the following:    Glucose, UA 50 (*)    Hgb urine dipstick SMALL (*)    Squamous Epithelial / LPF 0-5 (*)    All other components within normal limits  I-STAT CHEM 8, ED - Abnormal; Notable for the following:    Glucose, Bld 106 (*)    Hemoglobin 11.9 (*)    HCT 35.0 (*)    All other components within normal limits  ETHANOL  PROTIME-INR  APTT  DIFFERENTIAL  I-STAT TROPONIN, ED     EKG  EKG Interpretation  Date/Time:  Monday October 19 2016 13:40:24 EDT Ventricular Rate:  65 PR Interval:  154 QRS Duration: 82 QT Interval:  394 QTC Calculation: 409 R Axis:   58 Text Interpretation:  Normal sinus rhythm with sinus arrhythmia Cannot rule out Anterior infarct , age undetermined Abnormal ECG Confirmed by Quintella Reichert [phone]) on 10/19/2016 10:29:04 PM       Radiology Ct Head Wo Contrast  Result Date: 10/19/2016 CLINICAL DATA:  Dizziness for 1 month. History of leukemia and diabetes. EXAM: CT HEAD WITHOUT CONTRAST TECHNIQUE:  Contiguous axial images were obtained from the base of the skull through the vertex without intravenous contrast. COMPARISON:  CT HEAD March 04, 2016 FINDINGS: BRAIN: No intraparenchymal hemorrhage, mass effect nor midline shift. The ventricles and sulci are normal for age. Patchy supratentorial white matter hypodensities less than expected for patient's age, though non-specific are most compatible with chronic small vessel ischemic disease. Old small RIGHT cerebellar infarct. No acute large vascular territory infarcts. No abnormal extra-axial fluid collections. Basal cisterns are patent. VASCULAR: Mild calcific atherosclerosis of the carotid siphons. SKULL: No skull fracture. No significant scalp soft tissue swelling. SINUSES/ORBITS: Mild paranasal sinus mucosal thickening. Mastoid air cells are well aerated.The included ocular globes and orbital contents are non-suspicious. Status post bilateral ocular lens implants. OTHER: None. IMPRESSION: 1. No acute intracranial process. 2. Old small RIGHT cerebellar infarct ; otherwise negative noncontrast CT HEAD for age. Electronically Signed   By: Elon Alas M.D.   On: 10/19/2016 15:49    Procedures Procedures (including critical care time)  Medications Ordered in ED Medications - No data to display   Initial Impression / Assessment and Plan / ED Course  I have reviewed the triage vital  signs and the nursing notes.  Pertinent labs & imaging results that were available during my care of the patient were reviewed by me and considered in my medical decision making (see chart for details).     Patient with history of CLL here for progressive right-sided numbness and weakness. His symptoms do appear to be waxing and waning and currently are improved. He does have some numbness on examination but appears to be strong in all 4 extremities. Discussed with neurology who recommends admission for further evaluation. Hospitalist consulted for admission.  Final Clinical Impressions(s) / ED Diagnoses   Final diagnoses:  None    New Prescriptions New Prescriptions   No medications on file     Quintella Reichert, MD 10/20/16 0003

## 2016-10-19 NOTE — ED Notes (Signed)
Pt ambulatory around nurse's station with 4 point cane and standby assistance. Pt able to walk but reports dizziness when standing up and turning too quickly.

## 2016-10-19 NOTE — Consult Note (Signed)
Requesting Physician: Dr. Evelene Croon    Chief Complaint: right-sided weakness and right-side numbness  History obtained from:   Patient and Chart     HPI:                                                                                                                                       Michael [name] is an 83 y.o. male African-American, right-handed with past medical history significant for CLL, diabetes who presents with three-day history of intermittent right-sided weakness,numbness.  The patient states that when he woke up today morning around 8:00 am he noticed that he could not get up out of be because he is unable to move and feel his right  arm and leg. His symptoms improved gradually over the next few hours and by 11 AM he felt that most of his strength  had returned, however still felt numb on the right face arm and leg. He did not call 911 but eventually presented to [facility] ER. He also states that his symptoms started on Friday morning however resolved and occurred again on Sunday morning. He also complains of  Intermittent dizziness as well as chest pain. He takes aspirin daily. Denies any other symptoms such as slurred speech, difficulty getting words out, lack of balance, visual changes. He also denies shooting pain from his neck down to his arms or legs. CT head was obtained in the emergency room  which was negative for an acute stroke. He was diagnosed with CLL 2 years ago, and currently taking ibrutinib.     Date last known well:  9.21..18 Time last known well: Morning tPA Given: outside window NIHSS 1 MRS 1  Past Medical History:  Diagnosis Date  . Arthritis   . cll dx'd 2000   no rx  . CLL (chronic lymphoblastic leukemia)   . Diabetes mellitus   . Leukemia, chronic lymphoid ([place]) 12/08/2010  . Lymphocytosis     Past Surgical History:  Procedure Laterality Date  . CATARACT EXTRACTION      Family History  Problem Relation Age of Onset  . Cancer Mother   .  Hypertension Father   . Diabetes Father    Social History:  reports that he has never smoked. He has quit using smokeless tobacco. His smokeless tobacco use included Snuff. He reports that he does not drink alcohol or use drugs.  Allergies: No Known Allergies  Medications:  Reviewed   ROS:                                                                                                                                        General ROS: negative for - chills, fatigue, fever, night sweats, weight gain or weight loss Psychological ROS: negative for - behavioral disorder, hallucinations, memory difficulties, mood swings or suicidal ideation Ophthalmic ROS: negative for - blurry vision, double vision, eye pain or loss of vision ENT ROS: negative for - epistaxis, nasal discharge, oral lesions, sore throat, tinnitus or vertigo Allergy and Immunology ROS: negative for - hives or itchy/watery eyes Hematological and Lymphatic ROS: negative for - bleeding problems, bruising or swollen lymph nodes Endocrine ROS: negative for - galactorrhea, hair pattern changes, polydipsia/polyuria or temperature intolerance Respiratory ROS: negative for - cough, hemoptysis, shortness of breath or wheezing Cardiovascular ROS: negative for - chest pain, dyspnea on exertion, edema or irregular heartbeat Gastrointestinal ROS: negative for - abdominal pain, diarrhea, hematemesis, nausea/vomiting or stool incontinence Genito-Urinary ROS: negative for - dysuria, hematuria, incontinence or urinary frequency/urgency Musculoskeletal ROS: negative for - joint swelling or muscular weakness Neurological ROS: as noted in HPI Dermatological ROS: negative for rash and skin lesion changes   Examination:                                                                                                      General:  Appears well-developed and well-nourished.  Psych: Affect appropriate to situation Eyes: No scleral injection HENT: No OP obstrucion Head: Normocephalic.  Cardiovascular: Normal rate and regular rhythm.  Respiratory: Effort normal and breath sounds normal to anterior ascultation GI: Soft.  No distension. There is no tenderness.  Skin: WDI   Neurological Examination Mental Status: Alert, oriented, thought content appropriate.  Speech fluent without evidence of aphasia.  Able to follow 3 step commands without difficulty. Cranial Nerves: II: ; Visual fields grossly normal,  III,IV, VI: ptosis not present, extra-ocular motions intact bilaterally, pupils equal, round, reactive to light and accommodation V,VII: smile symmetric, reduced right face sensation  VIII: hearing normal bilaterally IX,X: uvula rises symmetrically XI: bilateral shoulder shrug XII: midline tongue extension Motor: Right : Upper extremity   5/5    Left:     Upper extremity   5/5  Lower extremity   5/5     Lower extremity   5/5 Tone and bulk:normal tone throughout; no atrophy noted Sensory: reduced sensation over right face, arm and leg Deep Tendon  Reflexes: 2+ and symmetric throughout Plantars: Right: downgoing   Left: downgoing Cerebellar: normal finger-to-nose, normal rapid alternating movements and normal heel-to-shin test Gait: normal gait and station     Lab Results: Basic Metabolic Panel:  Recent Labs Lab 10/19/16 1338 10/19/16 1356  NA 138 140  K 4.1 4.3  CL 104 103  CO2 24  --   GLUCOSE 107* 106*  BUN 16 19  CREATININE 1.17 1.10  CALCIUM 8.7*  --     CBC:  Recent Labs Lab 10/19/16 1338 10/19/16 1356  WBC 7.7  --   NEUTROABS 4.0  --   HGB 10.7* 11.9*  HCT 33.7* 35.0*  MCV 84.7  --   PLT 212  --     Coagulation Studies:  Recent Labs  10/19/16 1338  LABPROT 13.1  INR 1.00    Imaging: Ct Head Wo Contrast  Result Date: 10/19/2016 CLINICAL DATA:  Dizziness for 1 month.  History of leukemia and diabetes. EXAM: CT HEAD WITHOUT CONTRAST TECHNIQUE: Contiguous axial images were obtained from the base of the skull through the vertex without intravenous contrast. COMPARISON:  CT HEAD March 04, 2016 FINDINGS: BRAIN: No intraparenchymal hemorrhage, mass effect nor midline shift. The ventricles and sulci are normal for age. Patchy supratentorial white matter hypodensities less than expected for patient's age, though non-specific are most compatible with chronic small vessel ischemic disease. Old small RIGHT cerebellar infarct. No acute large vascular territory infarcts. No abnormal extra-axial fluid collections. Basal cisterns are patent. VASCULAR: Mild calcific atherosclerosis of the carotid siphons. SKULL: No skull fracture. No significant scalp soft tissue swelling. SINUSES/ORBITS: Mild paranasal sinus mucosal thickening. Mastoid air cells are well aerated.The included ocular globes and orbital contents are non-suspicious. Status post bilateral ocular lens implants. OTHER: None. IMPRESSION: 1. No acute intracranial process. 2. Old small RIGHT cerebellar infarct ; otherwise negative noncontrast CT HEAD for age. Electronically Signed   By: Elon Alas M.D.   On: 10/19/2016 15:49     ASSESSMENT AND PLAN  83 y.o. male African-American, right-handed with past medical history significant for CLL, diabetes who presents with three-day history of intermittent right-sided weakness,numbness. Given the number risk factors include prudent to rule out the patient had a small lacunar stroke that can be missed on CT scan.  Suspected Acute Ischemic Stroke  Risk factors: CLL, DM, HTN   Recommend # MRI of the brain without contrast #MRA Head and neck  #Transthoracic Echo  # Start patient on ASA 81mg ,load with 600 mg Plavix today and continue 75mg  daily (initial dual antiplatelet therapy per results of POINT, [facility]ANCE trials). Duration of dual antiplatelet treatment usually 3 weeks to 3  months and will decided by stroke team based on mechanism of stroke.  #Start or continue Atorvastatin 80 mg/other high intensity statin # BP goal: permissive HTN upto 086 systolic, PRNs above 21 # HBAIC and Lipid profile # Telemetry monitoring # Frequent neuro checks #Swallow screen  Please page stroke NP  Or  PA  Or MD from 8am -4 pm  as this patient from this time will be  followed by the stroke.   You can look them up on [email or web address]  Password [facility]    Tylisha Danis [facility] Pager Number [phone]

## 2016-10-19 NOTE — ED Triage Notes (Signed)
Pt in c/o right arm and leg numbness for the last three days that seems to be getting worse, today it effected his gait and he had to use a walker to get around, pt also reports episode of chest pain that occurred this AM that was relieved by nitro SL x1 at home, denies pain at this time, alert and oriented, answers questions appropriately

## 2016-10-20 ENCOUNTER — Other Ambulatory Visit (HOSPITAL_COMMUNITY): Payer: Medicare HMO

## 2016-10-20 ENCOUNTER — Observation Stay (HOSPITAL_COMMUNITY): Payer: Medicare HMO

## 2016-10-20 ENCOUNTER — Encounter (HOSPITAL_COMMUNITY): Payer: Self-pay | Admitting: Family Medicine

## 2016-10-20 DIAGNOSIS — R42 Dizziness and giddiness: Secondary | ICD-10-CM | POA: Diagnosis not present

## 2016-10-20 DIAGNOSIS — D630 Anemia in neoplastic disease: Secondary | ICD-10-CM

## 2016-10-20 DIAGNOSIS — R202 Paresthesia of skin: Secondary | ICD-10-CM | POA: Diagnosis not present

## 2016-10-20 DIAGNOSIS — I10 Essential (primary) hypertension: Secondary | ICD-10-CM

## 2016-10-20 DIAGNOSIS — C9190 Lymphoid leukemia, unspecified not having achieved remission: Secondary | ICD-10-CM

## 2016-10-20 DIAGNOSIS — R531 Weakness: Secondary | ICD-10-CM | POA: Diagnosis not present

## 2016-10-20 DIAGNOSIS — R51 Headache: Secondary | ICD-10-CM | POA: Diagnosis not present

## 2016-10-20 DIAGNOSIS — R600 Localized edema: Secondary | ICD-10-CM

## 2016-10-20 DIAGNOSIS — R200 Anesthesia of skin: Secondary | ICD-10-CM | POA: Diagnosis not present

## 2016-10-20 LAB — GLUCOSE, CAPILLARY
GLUCOSE-CAPILLARY: 143 mg/dL — AB (ref 65–99)
GLUCOSE-CAPILLARY: 85 mg/dL (ref 65–99)
GLUCOSE-CAPILLARY: 94 mg/dL (ref 65–99)
GLUCOSE-CAPILLARY: 158 mg/dL — AB (ref 65–99)
GLUCOSE-CAPILLARY: 98 mg/dL (ref 65–99)

## 2016-10-20 LAB — LIPID PANEL
Cholesterol: 95 mg/dL (ref 0–200)
HDL: 52 mg/dL (ref >40)
LDL CALC: 34 mg/dL (ref 0–99)
TRIGLYCERIDES: 44 mg/dL (ref <150)
Total CHOL/HDL Ratio: 1.8 RATIO
VLDL: 9 mg/dL (ref 0–40)

## 2016-10-20 LAB — HEMOGLOBIN A1C
Hgb A1c MFr Bld: 6.2 % — ABNORMAL HIGH (ref 4.8–5.6)
Mean Plasma Glucose: 131.24 mg/dL

## 2016-10-20 LAB — BRAIN NATRIURETIC PEPTIDE: B Natriuretic Peptide: 187.5 pg/mL — ABNORMAL HIGH (ref 0.0–100.0)

## 2016-10-20 MED ORDER — ENOXAPARIN SODIUM 40 MG/0.4ML ~~LOC~~ SOLN
40.0000 mg | SUBCUTANEOUS | Status: DC
  Administered 2016-10-20 – 2016-10-21 (×2): 40 mg via SUBCUTANEOUS
  Filled 2016-10-20 (×3): qty 0.4

## 2016-10-20 MED ORDER — RAMIPRIL 2.5 MG PO CAPS
5.0000 mg | ORAL_CAPSULE | Freq: Every day | ORAL | Status: DC
  Administered 2016-10-20 – 2016-10-21 (×2): 5 mg via ORAL
  Filled 2016-10-20 (×2): qty 2

## 2016-10-20 MED ORDER — ASPIRIN 325 MG PO TABS
325.0000 mg | ORAL_TABLET | Freq: Every day | ORAL | Status: DC
  Administered 2016-10-20 – 2016-10-21 (×2): 325 mg via ORAL
  Filled 2016-10-20 (×2): qty 1

## 2016-10-20 MED ORDER — INSULIN ASPART 100 UNIT/ML ~~LOC~~ SOLN: 0.0000 [IU] | Freq: Every day | SUBCUTANEOUS | Status: DC

## 2016-10-20 MED ORDER — IBRUTINIB 140 MG PO CAPS
280.0000 mg | ORAL_CAPSULE | Freq: Every day | ORAL | Status: DC
  Administered 2016-10-21: 280 mg via ORAL
  Filled 2016-10-20 (×2): qty 2

## 2016-10-20 MED ORDER — INSULIN ASPART PROT & ASPART (70-30 MIX) 100 UNIT/ML ~~LOC~~ SUSP
15.0000 [IU] | Freq: Two times a day (BID) | SUBCUTANEOUS | Status: DC
  Administered 2016-10-20 (×2): 15 [IU] via SUBCUTANEOUS
  Filled 2016-10-20: qty 10

## 2016-10-20 MED ORDER — ACETAMINOPHEN 160 MG/5ML PO SOLN: 650.0000 mg | ORAL | Status: DC | PRN

## 2016-10-20 MED ORDER — TAMSULOSIN HCL 0.4 MG PO CAPS
0.4000 mg | ORAL_CAPSULE | Freq: Every day | ORAL | Status: DC
  Administered 2016-10-20: 0.4 mg via ORAL
  Filled 2016-10-20: qty 1

## 2016-10-20 MED ORDER — INSULIN ASPART 100 UNIT/ML ~~LOC~~ SOLN
0.0000 [IU] | Freq: Three times a day (TID) | SUBCUTANEOUS | Status: DC
  Administered 2016-10-20 – 2016-10-21 (×3): 3 [IU] via SUBCUTANEOUS

## 2016-10-20 MED ORDER — ACETAMINOPHEN 650 MG RE SUPP: 650.0000 mg | RECTAL | Status: DC | PRN

## 2016-10-20 MED ORDER — TORSEMIDE 20 MG PO TABS
20.0000 mg | ORAL_TABLET | Freq: Every day | ORAL | Status: DC
  Administered 2016-10-20: 20 mg via ORAL
  Filled 2016-10-20: qty 1

## 2016-10-20 MED ORDER — IOPAMIDOL (ISOVUE-370) INJECTION 76%
INTRAVENOUS | Status: AC
Start: 2016-10-20 — End: 2016-10-20
  Administered 2016-10-20: 50 mL via INTRAVENOUS
  Filled 2016-10-20: qty 50

## 2016-10-20 MED ORDER — MORPHINE SULFATE 15 MG PO TABS: 15.0000 mg | ORAL_TABLET | Freq: Four times a day (QID) | ORAL | Status: DC | PRN

## 2016-10-20 MED ORDER — STROKE: EARLY STAGES OF RECOVERY BOOK
Freq: Once | Status: AC
  Administered 2016-10-20: 02:00:00
  Filled 2016-10-20: qty 1

## 2016-10-20 MED ORDER — ACETAMINOPHEN 325 MG PO TABS: 650.0000 mg | ORAL_TABLET | ORAL | Status: DC | PRN

## 2016-10-20 NOTE — Progress Notes (Signed)
STROKE TEAM PROGRESS NOTE   HISTORY OF PRESENT ILLNESS (per record) Michael [name] is an 83 y.o. male African-American, right-handed with past medical history significant for CLL, diabetes who presents with three-day history of intermittent right-sided weakness, numbness.  The patient states that when he woke up on around 8:00 am on 10/19/2016 and noticed that he could not get up out of bed because he was unable to move and feel his right arm and leg.  His symptoms improved gradually over the next few hours and by 11 AM he felt that most of his strength had returned.  However, he still felt numb on the right face arm and leg. He did not call 911 but eventually presented to [facility] ER. He also states that his symptoms started on Friday morning however resolved and occurred again on Sunday morning. He also complains of  Intermittent dizziness as well as chest pain. He takes aspirin daily. Denies any other symptoms such as slurred speech, difficulty getting words out, lack of balance, visual changes. He also denies shooting pain from his neck down to his arms or legs. CT head was obtained in the emergency room  which was negative for an acute stroke. He was diagnosed with CLL 2 years ago, and currently taking ibrutinib.   Date last known well:  9.21..18 Time last known well: Morning NIHSS 1 MRS 1   Patient was not administered IV t-PA secondary to arriving outside of the tPA treatment window. He was admitted to General neurology for further evaluation and treatment.   SUBJECTIVE (INTERVAL HISTORY) No family is at the bedside.  The patient is awake, alert, and follows all commands appropriately. He stated that he had right arm and leg weakness since 2 months ago, especially on waking up in the morning, he felt morning stiffness with decreased movement of right shoulder and right hip with achy pain at right shoulder and right hip. As the days go by, right-sided weakness and achy pain improve, so  that at night he is near at baseline. However the second day, it is all over again. For the last 1 week, he felt weaker and more achy pain in the morning when he woke up. He had chronic right shoulder achy pain and morning stiffness for long time, left lower extremity never had such problem. On exam, he had subjective right-sided decreased sensation, 90% to the left, and giveaway weakness on the right arm and leg.   OBJECTIVE Temp:  [97.8 F (36.6 C)-99.1 F (37.3 C)] 98.9 F (37.2 C) (09/25 1612) Pulse Rate:  [48-71] 56 (09/25 1612) Cardiac Rhythm: Sinus bradycardia (09/25 0700) Resp:  [10-22] 18 (09/25 1612) BP: (114-173)/(50-72) 119/51 (09/25 1612) SpO2:  [94 %-100 %] 100 % (09/25 1612)  CBC:   Recent Labs Lab 10/19/16 1338 10/19/16 1356  WBC 7.7  --   NEUTROABS 4.0  --   HGB 10.7* 11.9*  HCT 33.7* 35.0*  MCV 84.7  --   PLT 212  --     Basic Metabolic Panel:   Recent Labs Lab 10/19/16 1338 10/19/16 1356  NA 138 140  K 4.1 4.3  CL 104 103  CO2 24  --   GLUCOSE 107* 106*  BUN 16 19  CREATININE 1.17 1.10  CALCIUM 8.7*  --     Lipid Panel:     Component Value Date/Time   CHOL 95 10/20/2016 0732   TRIG 44 10/20/2016 0732   HDL 52 10/20/2016 0732   CHOLHDL 1.8 10/20/2016 0732  VLDL 9 10/20/2016 0732   LDLCALC 34 10/20/2016 0732   HgbA1c:  Lab Results  Component Value Date   HGBA1C 6.2 (H) 10/20/2016   Urine Drug Screen:     Component Value Date/Time   LABOPIA POSITIVE (A) 10/19/2016 2239   COCAINSCRNUR NONE DETECTED 10/19/2016 2239   LABBENZ NONE DETECTED 10/19/2016 2239   AMPHETMU NONE DETECTED 10/19/2016 2239   THCU NONE DETECTED 10/19/2016 2239   LABBARB NONE DETECTED 10/19/2016 2239    Alcohol Level     Component Value Date/Time   ETH <5 10/19/2016 1338    IMAGING I have personally reviewed the radiological images below and agree with the radiology interpretations.  Ct Angio Head W Or Wo Contrast Ct Angio Neck W Or Wo  Contrast 10/20/2016 IMPRESSION: CTA NECK: 1. No hemodynamically significant stenosis or acute vascular process in the neck. CTA HEAD: 1. No emergent large vessel occlusion or significant stenosis. 2. Mild intracranial atherosclerosis. Moderate stenosis LEFT P2, RIGHT M3 segments, LEFT M2 origin. Aortic Atherosclerosis (ICD10-I70.0).  Ct Head Wo Contrast 10/19/2016 IMPRESSION: 1. No acute intracranial process. 2. Old small RIGHT cerebellar infarct ; otherwise negative noncontrast CT HEAD for age.  Mr Brain Wo Contrast 10/20/2016 IMPRESSION: No acute intracranial abnormality. 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  TTE 10/20/2016 pending  MRI C-spine pending   PHYSICAL EXAM  Temp:  [97.8 F (36.6 C)-99.1 F (37.3 C)] 98.4 F (36.9 C) (09/25 2125) Pulse Rate:  [48-71] 55 (09/25 2125) Resp:  [10-22] 18 (09/25 2125) BP: (107-151)/(50-62) 107/53 (09/25 2125) SpO2:  [94 %-100 %] 100 % (09/25 2125)  General - Well nourished, well developed, in no apparent distress.  Ophthalmologic - Fundi not visualized due to small pupils.  Cardiovascular - Regular rate and rhythm.  Mental Status -  Level of arousal and orientation to time, place, and person were intact. Language including expression, naming, repetition, comprehension was assessed and found intact. Fund of Knowledge was assessed and was intact.  Cranial Nerves II - XII - II - Visual field intact OU. III, IV, VI - Extraocular movements intact. V - subjective right facial decreased light touch sensation, 80-90% of the left, however also complained of right-sided decreased vibration sensation on frontal tuning fork test. VII - Facial movement intact bilaterally. VIII - Hearing & vestibular intact bilaterally. X - Palate elevates symmetrically. XI - Chin turning & shoulder shrug intact bilaterally. XII - Tongue protrusion intact.  Motor Strength - The patient's strength was normal in all extremities  except RUE and RLE mild giveaway weakness and pronator drift was absent.  Bulk was normal and fasciculations were absent.   Motor Tone - Muscle tone was assessed at the neck and appendages and was normal.  Reflexes - The patient's reflexes were 1+ in all extremities and he had no pathological reflexes.  Sensory - Light touch, temperature/pinprick were assessed and subjective right decreased sensation, 80-90% of the left.    Coordination - The patient had normal movements in the hands with no ataxia or dysmetria.  Tremor was absent.  Gait and Station - deferred   ASSESSMENT/PLAN Mr. Cooper Moroney is a 83 y.o. male with history of CLL, diabetes who presents with three-day history of intermittent right-sided weakness, numbness. He did not receive IV t-PA due to arriving outside of the tPA treatment window.   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  No LVO or high-grade stenosis.  2D Echo:  Pending  MRI C-spine pending  LDL 34  HgbA1c 6.2  SCDs for VTE prophylaxis Diet heart healthy/carb modified Room service appropriate? Yes; Fluid consistency: Thin  aspirin 325 mg daily prior to admission, now on aspirin 325 mg daily  Patient counseled to be compliant with his antithrombotic medications  Ongoing aggressive stroke risk factor management  Therapy recommendations:  Home health OT/PT  Disposition: pending  Hypertension  Stable   homemaking including for torsemide and ramipril  Long-term BP goal normotensive  Diabetes  HgbA1c 6.2, goal < 7.0  Controlled  Other Stroke Risk Factors  CLL on chemo for 2 years  Other Active Problems  None  Hospital day # 0  Rosalin Hawking, MD PhD Stroke Neurology 10/20/2016 9:53 PM   To contact Stroke Continuity provider, please refer to  [email or web address] After hours, contact General Neurology

## 2016-10-20 NOTE — Care Management Obs Status (Signed)
[facility] NOTIFICATION   Patient Details  Name: Michael [name] MRN: [id number] Date of Birth: 12-Apr-1930   Medicare Observation Status Notification Given:  Yes    Carles Collet, RN 10/20/2016, 12:03 PM

## 2016-10-20 NOTE — H&P (Signed)
History and Physical  Patient Name: Michael [name]     YIR:[id number]    DOB: 01/07/1931    DOA: 10/19/2016 PCP: Anda Kraft, MD   Patient coming from: Home     Chief Complaint: Paresthesias  HPI: Michael [name] is a 83 y.o. male with a past medical history significant for IDDM, CLL, and HTN who presents with paresthesias.  The patient was in his usual state of health until about 3-4 days ago when he started to notice "tingling from the tips of my hand to the tips of my toes on the right".  This would start gradually whenever he lay down to sleep, would get progressively worse all night until morning when it would be severe, so that for an hour or two the past few mornings he could barely walk until the tingling went away.  Then today he talked to his daughter who is a Marine scientist and was afraid it was a stroke and so he came to the ER.  No focal weakness, no slurred speech, no ataxia, no facial droop.  No fever, chills, cough, sputum production, dysuria, hematuria, flank pain.  No dyspnea, chest pain, orthopnea, PND, change in his chronic leg swelling.  ED course: -Afebrile, heart rate 58, respirations and pulse ox normal, BP 123/62 -Na 138, K 4.1, Cr 1.17 (baseline 1.1), WBC 7.7K, Hgb 10.7 (stable) -LFTs normal -ETOH negative -Coags normal -Troponin negative -UDS positive for opiates -UA unremarkable -CT head with no acute change -Neuro were consulted who recommended stroke work up and TRH were asked to make arranagements for admission     Review of systems:  Review of Systems  Constitutional: Negative for fever and malaise/fatigue.  Neurological: Positive for tingling. Negative for dizziness, tremors, sensory change, speech change, focal weakness, seizures, loss of consciousness, weakness and headaches.  All other systems reviewed and are negative.        Past Medical History:  Diagnosis Date  . Arthritis   . cll dx'd 2000   no rx  . CLL (chronic lymphoblastic leukemia)    . Diabetes mellitus   . Leukemia, chronic lymphoid ([place]) 12/08/2010  . Lymphocytosis     Past Surgical History:  Procedure Laterality Date  . CATARACT EXTRACTION      Social History: Patient lives with his wife.  Patient walks with a cane.  He is not a smoker.    No Known Allergies  Family history: family history includes Cancer in his mother; Diabetes in his father; Hypertension in his father.  Prior to Admission medications   Medication Sig Start Date End Date Taking? Authorizing Provider  aspirin 325 MG tablet Take 325 mg by mouth daily.     Yes [provider]  ibrutinib (IMBRUVICA) 140 MG capsul Take 2 capsules (280 mg total) by mouth daily. 07/30/16  Yes Gorsuch, Ni, MD  insulin NPH-insulin regular (NOVOLIN 70/30) (70-30) 100 UNIT/ML injection Inject 15-20 Units into the skin 2 (two) times daily as needed (CBG >100). Sliding scale   Yes [provider]  metFORMIN (GLUCOPHAGE) 1000 MG tablet Take 1,000 mg by mouth 2 (two) times daily with a meal.    Yes [provider]  morphine (MSIR) 15 MG tablet Take 15 mg by mouth every 6 (six) hours as needed for severe pain.  10/01/16  Yes [provider]  Multiple Vitamin (MULTIVITAMIN WITH MINERALS) TABS tablet Take 1 tablet by mouth daily.   Yes [provider]  pantoprazole (PROTONIX) 40 MG tablet Take 1 tablet (40  mg total) by mouth daily. 04/10/16  Yes Gorsuch, Ni, MD  ramipril (ALTACE) 5 MG capsule Take 5 mg by mouth daily. 10/16/16  Yes [provider]  Tamsulosin HCl (FLOMAX) 0.4 MG CAPS Take 0.4 mg by mouth daily after supper.    Yes [provider]  torsemide (DEMADEX) 20 MG tablet Take 20 mg by mouth daily after supper.    Yes [provider]  oxyCODONE-acetaminophen (PERCOCET/ROXICET) 5-325 MG tablet Take 1 tablet by mouth every 4 (four) hours as needed for severe pain. Patient not taking: Reported on 10/19/2016 07/04/16   Shary Decamp, PA-C     Physical Exam: BP  (!) 151/51 (BP Location: Left Arm)   Pulse (!) 48   Temp 97.9 F (36.6 C) (Oral)   Resp 20   SpO2 100%  General appearance: Well-developed, adult male, alert and in no acute distress.   Eyes: Anicteric, conjunctiva pink, lids and lashes normal. PERRL.    ENT: No nasal deformity, discharge, epistaxis.  Hearing normal. OP moist without lesions.    Lymph: No cervical, supraclavicular or axillary lymphadenopathy. Skin: Warm and dry.  No jaundice.  No suspicious rashes or lesions. Cardiac: RRR, nl S1-S2, no murmurs appreciated.  Capillary refill is brisk.  JVP normal. Radial pulses 2+ and symmetric.  2+ chronic appearing LE edema.  No carotid bruits. Respiratory: Normal respiratory rate and rhythm.  CTAB without rales or wheezes. GI: Abdomen soft without rigidity.  No TTP. No ascites, distension, no hepatosplenomegaly.   MSK: No deformities or effusions. Neuro: Pupils are 4 mm and reactive to 3 mm. Extraocular movements are intact, without nystagmus. Cranial nerve 5 is within normal limits. Cranial nerve 7 is symmetrical. Cranial nerve 8 is within normal limits. Cranial nerves 9 and 10 reveal equal palate elevation. Cranial nerve 11 reveals sternocleidomastoid strong. Cranial nerve 12 is midline. I do not note a deficit in motor strength testing in the upper and lower extremities bilaterally with normal motor, tone and bulk. Romberg maneuver is negative for pathology. Finger-to-nose testing is within normal limits. Speech is fluent. Naming is grossly intact. Attention span and concentration are within normal limits.   Psych: The patient is oriented to time, place and person. Behavior appropriate.  Affect normal.  Recall, recent and remote, as well as general fund of knowledge seem within normal limits. No evidence of aural or visual hallucinations or delusions.       Labs on Admission:  I have personally reviewed following labs and imaging studies: CBC:  Recent Labs Lab 10/19/16 1338  10/19/16 1356  WBC 7.7  --   NEUTROABS 4.0  --   HGB 10.7* 11.9*  HCT 33.7* 35.0*  MCV 84.7  --   PLT 212  --    Basic Metabolic Panel:  Recent Labs Lab 10/19/16 1338 10/19/16 1356  NA 138 140  K 4.1 4.3  CL 104 103  CO2 24  --   GLUCOSE 107* 106*  BUN 16 19  CREATININE 1.17 1.10  CALCIUM 8.7*  --    GFR: Estimated Creatinine Clearance: 52.9 mL/min (by C-G formula based on SCr of 1.1 mg/dL). Liver Function Tests:  Recent Labs Lab 10/19/16 1338  AST 23  ALT 12*  ALKPHOS 40  BILITOT 0.5  PROT 5.4*  ALBUMIN 3.2*   No results for input(s): LIPASE, AMYLASE in the last 168 hours. No results for input(s): AMMONIA in the last 168 hours. Coagulation Profile:  Recent Labs Lab 10/19/16 1338  INR 1.00  Cardiac Enzymes: No results for input(s): CKTOTAL, CKMB, CKMBINDEX, TROPONINI in the last 168 hours. BNP (last 3 results) No results for input(s): PROBNP in the last 8760 hours. HbA1C: No results for input(s): HGBA1C in the last 72 hours. CBG:  Recent Labs Lab 10/20/16 0228  GLUCAP 143*   Lipid Profile: No results for input(s): CHOL, HDL, LDLCALC, TRIG, CHOLHDL, LDLDIRECT in the last 72 hours. Thyroid Function Tests: No results for input(s): TSH, T4TOTAL, FREET4, T3FREE, THYROIDAB in the last 72 hours. Anemia Panel: No results for input(s): VITAMINB12, FOLATE, FERRITIN, TIBC, IRON, RETICCTPCT in the last 72 hours. Sepsis Labs:  Invalid input(s): PROCALCITONIN, LACTICIDVEN No results found for this or any previous visit (from the past 240 hour(s)).    Radiological Exams on Admission: Personally reviewed CT head report: Ct Head Wo Contrast  Result Date: 10/19/2016 CLINICAL DATA:  Dizziness for 1 month. History of leukemia and diabetes. EXAM: CT HEAD WITHOUT CONTRAST TECHNIQUE: Contiguous axial images were obtained from the base of the skull through the vertex without intravenous contrast. COMPARISON:  CT HEAD March 04, 2016 FINDINGS: BRAIN: No  intraparenchymal hemorrhage, mass effect nor midline shift. The ventricles and sulci are normal for age. Patchy supratentorial white matter hypodensities less than expected for patient's age, though non-specific are most compatible with chronic small vessel ischemic disease. Old small RIGHT cerebellar infarct. No acute large vascular territory infarcts. No abnormal extra-axial fluid collections. Basal cisterns are patent. VASCULAR: Mild calcific atherosclerosis of the carotid siphons. SKULL: No skull fracture. No significant scalp soft tissue swelling. SINUSES/ORBITS: Mild paranasal sinus mucosal thickening. Mastoid air cells are well aerated.The included ocular globes and orbital contents are non-suspicious. Status post bilateral ocular lens implants. OTHER: None. IMPRESSION: 1. No acute intracranial process. 2. Old small RIGHT cerebellar infarct ; otherwise negative noncontrast CT HEAD for age. Electronically Signed   By: Elon Alas M.D.   On: 10/19/2016 15:49   Mr Brain Wo Contrast  Result Date: 10/20/2016 CLINICAL DATA:  Right arm and leg numbness EXAM: MRI HEAD WITHOUT CONTRAST TECHNIQUE: Multiplanar, multiecho pulse sequences of the brain and surrounding structures were obtained without intravenous contrast. COMPARISON:  Head CT 10/19/2016 FINDINGS: Brain: The midline structures are normal. There is no focal diffusion restriction to indicate acute infarct. The brain parenchymal signal is normal and there is no mass lesion. No intraparenchymal hematoma or chronic microhemorrhage. Brain volume is normal for age without lobar predominant atrophy. The dura is normal and there is no extra-axial collection. Vascular: Major intracranial arterial and venous sinus flow voids are preserved. Skull and upper cervical spine: The visualized skull base, calvarium, upper cervical spine and extracranial soft tissues are normal. Sinuses/Orbits: No fluid levels or advanced mucosal thickening. Small amount of left  mastoid fluid. Bilateral lens replacements. IMPRESSION: No acute intracranial abnormality. 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 Electronically Signed   By: Ulyses Jarred M.D.   On: 10/20/2016 00:17     EKG: Independently reviewed. Rate 65, QTc 409, no ST changes.       Assessment/Plan Principal Problem:   Paresthesias Active Problems:   CLL (chronic lymphocytic leukemia) (HCC)   Anemia in neoplastic disease   Bilateral leg edema   Essential hypertension  1. Paresthesias:  Temporal pattern (progressive with laying down) not typical for stroke, but this is obvious concern in this patient with DM and HTN.  MRI pending.  Other considerations include polyneuropathy from his CLL medicine, or other polyneuropathy. -Admit to telemetry -Neuro checks,  NIHSS per protocol -Daily aspirin 325 mg for now, further recs per Neurology pending MRI -Permissive hypertension for now, restart ramipril if MRI normal -Lipids, hemoglobin A1c in AM -MRA head and neck if MR brain abnormal -Echocardiogram ordered -PT/OT/SLP consultation -Consult to Neurology, appreciate recommendations   2. Leg swelling:  Chronic -Check BNP -Continue torsemide  3. HTN:  -Permissive hypertension for now, hold ramipril, restart if MRI normal tonight  4. Diabetes:  Stable.  -Continue 70/30 -SSI with meals -Hold metformin       DVT prophylaxis: Lovenox  Code Status: FULL  Family Communication: None presentt  Disposition Plan: Anticipate Stroke work up as above and consult to ancillary services.  Expect discharge within 1-2 days. Consults called: Neurology, Dr. Lorraine Lax has seen patient. Admission status: Telemetry, OBS status  Core measures: -VTE prophylaxis ordered at time of admission -Aspirin ordered at admission -Atrial fibrillation: not present -tPA not given because of outside stroke window -Dysphagia screen ordered in ER -Lipids ordered -PT eval  ordered -Nonsmoker    Medical decision making: Patient seen at 1:30 AM on 10/20/2016.  The patient was discussed with Dr. Ralene Bathe. What exists of the patient's chart was reviewed in depth and summarized above.  Clinical condition: stable.       [name] [facility] Pager [phone]

## 2016-10-20 NOTE — Progress Notes (Signed)
[facility]   Patient admitted after midnight, see H&P for further details. Patient seen, chart reviewed, no significant changes awaiting neurological evaluation and ECHO   Polyneuropathy from DM 2 Continue to monitor   Leg swelling  Continue torsemide  Order TED Hose   Rest per H&P   Chipper Oman, MD

## 2016-10-20 NOTE — Evaluation (Signed)
Physical Therapy Evaluation Patient Details Name: Michael [name] MRN: [id number] DOB: 01-03-1931 Today's Date: 10/20/2016   History of Present Illness  Michael [name] is a 83 y.o. male with a past medical history significant for IDDM, CLL, and HTN who presents with paresthesias. 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 Pt currently with functional limitations due to the deficits listed below (see PT Problem List). Pt with decreased motor control RLE which is causing him to be unsafe with ambulation with his quad cane which was his AD PTA. Did better with RW, min-guard A, but still occasionally catching R toes on floor. High fall risk at this point.  Pt will benefit from skilled PT to increase their independence and safety with mobility to allow discharge to the venue listed below.       Follow Up Recommendations Home health PT    Equipment Recommendations  Rolling walker with 5" wheels    Recommendations for Other Services       Precautions / Restrictions Precautions Precautions: Fall Restrictions Weight Bearing Restrictions: No      Mobility  Bed Mobility Overal bed mobility: Modified Independent             General bed mobility comments: pt able to get to EOB with momentum and increased time  Transfers Overall transfer level: Modified independent Equipment used: Rolling walker (2 wheeled)             General transfer comment: pt stood safely at EOB and can maintain static balance but as soon as he begins to take a step, needs support  Ambulation/Gait Ambulation/Gait assistance: Min assist;Min guard Ambulation Distance (Feet): 75 Feet Assistive device: Rolling walker (2 wheeled);Quad cane Gait Pattern/deviations: Step-through pattern;Decreased weight shift to right;Decreased stance time - right Gait velocity: decreased Gait velocity interpretation: <1.8 ft/sec, indicative of risk for recurrent  falls General Gait Details: pt able to ambulate with RW with min-guard A, needed vc's to attend to step height of RLE as he is having difficulty with hip flex and df on that side. When using quad cane, needed min A, was not safe with this and nearly caught R toes even on smooth, level surface  Stairs            Wheelchair Mobility    Modified Rankin (Stroke Patients Only) Modified Rankin (Stroke Patients Only) Pre-Morbid Rankin Score: No significant disability Modified Rankin: Moderately severe disability     Balance Overall balance assessment: Needs assistance Sitting-balance support: No upper extremity supported Sitting balance-Leahy Scale: Good     Standing balance support: No upper extremity supported Standing balance-Leahy Scale: Fair Standing balance comment: needs UE support for dynamic balance Single Leg Stance - Right Leg: 0                           Pertinent Vitals/Pain Pain Assessment: 0-10 Pain Score: 4  Pain Location: R hip Pain Descriptors / Indicators: Aching Pain Intervention(s): Limited activity within patient's tolerance;Monitored during session    Home Living Family/patient expects to be discharged to:: Private residence Living Arrangements: Spouse/significant other Available Help at Discharge: Family;Available PRN/intermittently Type of Home: House Home Access: Ramped entrance;Stairs to enter Entrance Stairs-Rails: Right Entrance Stairs-Number of Steps: 4 Home Layout: One level Home Equipment: Walker - 4 wheels;Shower seat - built in;Grab bars - toilet;Grab bars - tub/shower;Cane - quad Additional Comments: wife has a BKA and he was helping  her with her prosthesis and has been unable to do so past few days so daughters have been coming over to help them both. Wife cannot assist him.     Prior Function Level of Independence: Independent with assistive device(s)         Comments: at baseline ambulates with quad cane     Hand  Dominance   Dominant Hand: Right    Extremity/Trunk Assessment   Upper Extremity Assessment Upper Extremity Assessment: Defer to OT evaluation RUE Deficits / Details: decreased fine motor coordination RUE:  (r shld flex 90 degrees. pnt states it is limited from OA) RUE Sensation: decreased light touch (pnt able to detect light touch, different from l) RUE Coordination: decreased fine motor (pnt has decreased ability to write but is able to use functi) LUE Deficits / Details: l shld 90 degrees and is limted from oa    Lower Extremity Assessment Lower Extremity Assessment: RLE deficits/detail RLE Deficits / Details: decreased R active df, hip flex 4/5, knee ext 4/5 but pt with decreased control of muscualr contraction, decreased ability to maintain contraction RLE Sensation: decreased light touch;decreased proprioception RLE Coordination: decreased fine motor;decreased gross motor    Cervical / Trunk Assessment Cervical / Trunk Assessment: Normal  Communication   Communication: No difficulties  Cognition Arousal/Alertness: Awake/alert Behavior During Therapy: WFL for tasks assessed/performed Overall Cognitive Status: Within Functional Limits for tasks assessed                                        General Comments General comments (skin integrity, edema, etc.): with ther ex, instructed pt to perform LE exercises and then try to perform the next set faster as he is finding RLE to be "sluggish"    Exercises General Exercises - Lower Extremity Ankle Circles/Pumps: AROM;Both;10 reps;Seated Long Arc Quad: AROM;Right;10 reps;Seated Hip Flexion/Marching: AROM;Right;10 reps;Seated   Assessment/Plan    PT Assessment Patient needs continued PT services  PT Problem List Decreased strength;Decreased balance;Decreased mobility;Decreased coordination;Decreased knowledge of use of DME;Decreased knowledge of precautions;Impaired sensation       PT Treatment Interventions  DME instruction;Gait training;Stair training;Functional mobility training;Therapeutic activities;Therapeutic exercise;Balance training;Neuromuscular re-education;Patient/family education    PT Goals (Current goals can be found in the Care Plan section)  Acute Rehab PT Goals Patient Stated Goal: go home PT Goal Formulation: With patient Time For Goal Achievement: 11/03/16 Potential to Achieve Goals: Good    Frequency Min 3X/week   Barriers to discharge Decreased caregiver support wife unable to assist    Co-evaluation               AM-PAC PT "6 Clicks" Daily Activity  Outcome Measure Difficulty turning over in bed (including adjusting bedclothes, sheets and blankets)?: None Difficulty moving from lying on back to sitting on the side of the bed? : None Difficulty sitting down on and standing up from a chair with arms (e.g., wheelchair, bedside commode, etc,.)?: None Help needed moving to and from a bed to chair (including a wheelchair)?: A Little Help needed walking in hospital room?: A Little Help needed climbing 3-5 steps with a railing? : A Lot 6 Click Score: 20    End of Session Equipment Utilized During Treatment: Gait belt Activity Tolerance: Patient tolerated treatment well Patient left: in chair;with call bell/phone within reach Nurse Communication: Mobility status PT Visit Diagnosis: Unsteadiness on feet (R26.81);Hemiplegia and hemiparesis;Other abnormalities of gait and  mobility (R26.89) Hemiplegia - Right/Left: Right Hemiplegia - dominant/non-dominant: Dominant Hemiplegia - caused by: Unspecified    Time: 8882-8003 PT Time Calculation (min) (ACUTE ONLY): 21 min   Charges:   PT Evaluation $PT Eval Moderate Complexity: 1 Mod     PT G Codes:   PT G-Codes **NOT FOR INPATIENT CLASS** Functional Assessment Tool Used: AM-PAC 6 Clicks Basic Mobility Functional Limitation: Mobility: Walking and moving around Mobility: Walking and Moving Around Current Status  (K9179): At least 20 percent but less than 40 percent impaired, limited or restricted Mobility: Walking and Moving Around Goal Status [phone]): At least 1 percent but less than 20 percent impaired, limited or restricted    [place], [place]  [place] 10/20/2016, 11:42 AM

## 2016-10-20 NOTE — Progress Notes (Signed)
Pt. transported from ER to 5W-13; alert and oriented x4; oriented to room and call button. Lower legs swollen bil. and discolorarion both legs and greater (R) leg with some cracking of skin (R) lower leg.

## 2016-10-20 NOTE — Plan of Care (Signed)
Problem: Education: Goal: Knowledge of  General Education information/materials will improve Outcome: Progressing POC and orders reviewed with pt.   

## 2016-10-20 NOTE — Care Management Note (Signed)
Case Management Note  Patient Details  Name: Michael [name] MRN: [id number] Date of Birth: 04-28-1930  Subjective/Objective:   Polyneuropathy, DM                   Action/Plan: Discharge Planning: NCM spoke to pt and states he lives at home with wife. States he still drives to his appts. Has cane at home. Requesting RW. Contacted [facility] DME rep for RW for home to be delivered to room prior to dc. \  PCP Anda Kraft MD  Expected Discharge Date:                Expected Discharge Plan:  Home/Self Care  In-House Referral:  NA  Discharge planning Services  CM Consult  Post Acute Care Choice:  NA Choice offered to:  NA  DME Arranged:  Walker rolling DME Agency:  [place]:  NA HH Agency:  NA  Status of Service:  Completed, signed off  If discussed at [place] of Stay Meetings, dates discussed:    Additional Comments:  Erenest Rasher, RN 10/20/2016, 4:14 PM

## 2016-10-20 NOTE — Evaluation (Signed)
Occupational Therapy Evaluation Patient Details Name: Michael [name] MRN: [id number] DOB: 03-14-1930 Today's Date: 10/20/2016    History of Present Illness [name] is a 83 y.o. male with a past medical history significant for IDDM, CLL, and HTN who presents with paresthesias. 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 Is having decreased functional use of dominant r hand. Pt. Was educated on performing r hand coordination exercises. Pt. Is motivated to increase I with adls and mobility.     Follow Up Recommendations  Home health OT    Equipment Recommendations  None recommended by OT    Recommendations for Other Services       Precautions / Restrictions Precautions Precautions: Fall Restrictions Weight Bearing Restrictions: No      Mobility Bed Mobility Overal bed mobility: Modified Independent             General bed mobility comments: pt able to get to EOB with momentum and increased time  Transfers Overall transfer level: Modified independent Equipment used: Rolling walker (2 wheeled)             General transfer comment: pt stood safely at EOB and can maintain static balance but as soon as he begins to take a step, needs support    Balance Overall balance assessment: Needs assistance Sitting-balance support: No upper extremity supported Sitting balance-Leahy Scale: Good     Standing balance support: No upper extremity supported Standing balance-Leahy Scale: Fair Standing balance comment: needs UE support for dynamic balance Single Leg Stance - Right Leg: 0                         ADL either performed or assessed with clinical judgement   ADL Overall ADL's : Needs assistance/impaired Eating/Feeding: Independent   Grooming: Wash/dry hands;Wash/dry face;Supervision/safety;Standing   Upper Body Bathing: Set up;Sitting   Lower Body Bathing: Supervison/ safety;With adaptive equipment;Sit  to/from stand   Upper Body Dressing : Set up;Sitting   Lower Body Dressing: Supervision/safety;Set up;With adaptive equipment;Sit to/from stand   Toilet Transfer: Supervision/safety;Ambulation;Regular Toilet;Grab bars;RW       Tub/ Shower Transfer: Supervision/safety;Ambulation;Grab bars;Rolling walker   Functional mobility during ADLs: Supervision/safety;Rolling walker General ADL Comments:  (Pnt educated on use of sock donner and given instructions.)     Vision Baseline Vision/History: Wears glasses Wears Glasses: At all times Patient Visual Report: No change from baseline Vision Assessment?: No apparent visual deficits     Perception     Praxis      Pertinent Vitals/Pain Pain Assessment: 0-10 Pain Score: 4  Pain Location: R hip Pain Descriptors / Indicators: Aching Pain Intervention(s): Limited activity within patient's tolerance;Monitored during session     Hand Dominance Right   Extremity/Trunk Assessment Upper Extremity Assessment Upper Extremity Assessment: Defer to OT evaluation RUE Deficits / Details: decreased fine motor coordination RUE:  (r shld flex 90 degrees. pnt states it is limited from OA) RUE Sensation: decreased light touch (pnt able to detect light touch, different from l) RUE Coordination: decreased fine motor (pnt has decreased ability to write but is able to use functi) LUE Deficits / Details: l shld 90 degrees and is limted from oa   Lower Extremity Assessment Lower Extremity Assessment: RLE deficits/detail RLE Deficits / Details: decreased R active df, hip flex 4/5, knee ext 4/5 but pt with decreased control of muscualr contraction, decreased ability to maintain contraction RLE Sensation: decreased light touch;decreased proprioception RLE  Coordination: decreased fine motor;decreased gross motor   Cervical / Trunk Assessment Cervical / Trunk Assessment: Normal   Communication Communication Communication: No difficulties   Cognition  Arousal/Alertness: Awake/alert Behavior During Therapy: WFL for tasks assessed/performed Overall Cognitive Status: Within Functional Limits for tasks assessed                                     General Comments  with ther ex, instructed pt to perform LE exercises and then try to perform the next set faster as he is finding RLE to be "sluggish"    Exercises Exercises: General Lower Extremity General Exercises - Lower Extremity Ankle Circles/Pumps: AROM;Both;10 reps;Seated Long Arc Quad: AROM;Right;10 reps;Seated Hip Flexion/Marching: AROM;Right;10 reps;Seated   Shoulder Instructions      Home Living Family/patient expects to be discharged to:: Private residence Living Arrangements: Spouse/significant other Available Help at Discharge: Family;Available PRN/intermittently Type of Home: House Home Access: Ramped entrance;Stairs to enter Entrance Stairs-Number of Steps: 4 Entrance Stairs-Rails: Right Home Layout: One level     Bathroom Shower/Tub: Occupational psychologist: Handicapped height Bathroom Accessibility: Yes   Home Equipment: Environmental consultant - 4 wheels;Shower seat - built in;Grab bars - toilet;Grab bars - tub/shower;Cane - quad   Additional Comments: wife has a BKA and he was helping her with her prosthesis and has been unable to do so past few days so daughters have been coming over to help them both. Wife cannot assist him.       Prior Functioning/Environment Level of Independence: Independent with assistive device(s)        Comments: at baseline ambulates with quad cane        OT Problem List: Decreased strength;Decreased coordination;Decreased knowledge of use of DME or AE      OT Treatment/Interventions: Self-care/ADL training;Neuromuscular education;DME and/or AE instruction;Therapeutic activities;Patient/family education    OT Goals(Current goals can be found in the care plan section) Acute Rehab OT Goals Patient Stated Goal: go  home OT Goal Formulation: With patient Time For Goal Achievement: 10/27/16 Potential to Achieve Goals: Good ADL Goals Pt Will Perform Lower Body Bathing: with modified independence;sit to/from stand Pt Will Perform Lower Body Dressing: with modified independence;with adaptive equipment;sit to/from stand Pt Will Transfer to Toilet: with modified independence;ambulating;grab bars;regular height toilet Pt Will Perform Tub/Shower Transfer: with modified independence;ambulating;grab bars Pt/caregiver will Perform Home Exercise Program: Right Upper extremity;With written HEP provided (hep for fine motor coordination)  OT Frequency: Min 2X/week   Barriers to D/C:            Co-evaluation              AM-PAC PT "6 Clicks" Daily Activity     Outcome Measure Help from another person eating meals?: A Little Help from another person taking care of personal grooming?: A Little Help from another person toileting, which includes using toliet, bedpan, or urinal?: A Little Help from another person bathing (including washing, rinsing, drying)?: A Little Help from another person to put on and taking off regular upper body clothing?: A Little Help from another person to put on and taking off regular lower body clothing?: A Little 6 Click Score: 18   End of Session Equipment Utilized During Treatment: Gait belt;Rolling walker  Activity Tolerance: Patient tolerated treatment well Patient left: in chair;with call bell/phone within reach;with family/visitor present  OT Visit Diagnosis: Unsteadiness on feet (R26.81);Muscle weakness (generalized) (M62.81);Hemiplegia and hemiparesis  Hemiplegia - Right/Left: Right Hemiplegia - dominant/non-dominant: Dominant Hemiplegia - caused by: Cerebral infarction                Time: 8338-2505 OT Time Calculation (min): 43 min Charges:  OT General Charges $OT Visit: 1 Visit OT Evaluation $OT Eval Low Complexity: 1 Low OT Treatments $Self Care/Home  Management : 8-22 mins $Neuromuscular Re-education: 8-22 mins G-Codes: OT G-codes **NOT FOR INPATIENT CLASS** Functional Limitation: Self care Self Care Current Status (L9767): At least 1 percent but less than 20 percent impaired, limited or restricted Self Care Goal Status (H4193): 0 percent impaired, limited or restricted   6 clicks  Andon Villard 10/20/2016, 12:24 PM

## 2016-10-21 ENCOUNTER — Observation Stay (HOSPITAL_BASED_OUTPATIENT_CLINIC_OR_DEPARTMENT_OTHER): Payer: Medicare HMO

## 2016-10-21 ENCOUNTER — Observation Stay (HOSPITAL_COMMUNITY): Payer: Medicare HMO

## 2016-10-21 DIAGNOSIS — M25651 Stiffness of right hip, not elsewhere classified: Secondary | ICD-10-CM

## 2016-10-21 DIAGNOSIS — I10 Essential (primary) hypertension: Secondary | ICD-10-CM | POA: Diagnosis not present

## 2016-10-21 DIAGNOSIS — M2560 Stiffness of unspecified joint, not elsewhere classified: Secondary | ICD-10-CM

## 2016-10-21 DIAGNOSIS — C9190 Lymphoid leukemia, unspecified not having achieved remission: Secondary | ICD-10-CM | POA: Diagnosis not present

## 2016-10-21 DIAGNOSIS — I340 Nonrheumatic mitral (valve) insufficiency: Secondary | ICD-10-CM

## 2016-10-21 DIAGNOSIS — R202 Paresthesia of skin: Secondary | ICD-10-CM | POA: Diagnosis not present

## 2016-10-21 DIAGNOSIS — D630 Anemia in neoplastic disease: Secondary | ICD-10-CM | POA: Diagnosis not present

## 2016-10-21 DIAGNOSIS — R600 Localized edema: Secondary | ICD-10-CM | POA: Diagnosis not present

## 2016-10-21 DIAGNOSIS — M542 Cervicalgia: Secondary | ICD-10-CM | POA: Diagnosis not present

## 2016-10-21 LAB — GLUCOSE, CAPILLARY
GLUCOSE-CAPILLARY: 38 mg/dL — AB (ref 65–99)
GLUCOSE-CAPILLARY: 142 mg/dL — AB (ref 65–99)
GLUCOSE-CAPILLARY: 192 mg/dL — AB (ref 65–99)
Glucose-Capillary: 158 mg/dL — ABNORMAL HIGH (ref 65–99)

## 2016-10-21 LAB — MAGNESIUM: Magnesium: 1.8 mg/dL (ref 1.7–2.4)

## 2016-10-21 LAB — COMPREHENSIVE METABOLIC PANEL
ALT: 10 U/L — AB (ref 17–63)
AST: 17 U/L (ref 15–41)
Albumin: 2.7 g/dL — ABNORMAL LOW (ref 3.5–5.0)
Alkaline Phosphatase: 32 U/L — ABNORMAL LOW (ref 38–126)
Anion gap: 6 (ref 5–15)
BUN: 10 mg/dL (ref 6–20)
CHLORIDE: 106 mmol/L (ref 101–111)
CO2: 27 mmol/L (ref 22–32)
CREATININE: 0.9 mg/dL (ref 0.61–1.24)
Calcium: 8.3 mg/dL — ABNORMAL LOW (ref 8.9–10.3)
GFR calc non Af Amer: >60 mL/min (ref >60)
Glucose, Bld: 163 mg/dL — ABNORMAL HIGH (ref 65–99)
POTASSIUM: 3.7 mmol/L (ref 3.5–5.1)
SODIUM: 139 mmol/L (ref 135–145)
Total Bilirubin: 0.4 mg/dL (ref 0.3–1.2)
Total Protein: 4.8 g/dL — ABNORMAL LOW (ref 6.5–8.1)

## 2016-10-21 LAB — CBC WITH DIFFERENTIAL/PLATELET
BASOS ABS: 0 10*3/uL (ref 0.0–0.1)
Basophils Relative: 1 %
EOS ABS: 0.1 10*3/uL (ref 0.0–0.7)
EOS PCT: 2 %
HCT: 32 % — ABNORMAL LOW (ref 39.0–52.0)
Hemoglobin: 10.1 g/dL — ABNORMAL LOW (ref 13.0–17.0)
Lymphocytes Relative: 53 %
Lymphs Abs: 2.9 10*3/uL (ref 0.7–4.0)
MCH: 26.6 pg (ref 26.0–34.0)
MCHC: 31.6 g/dL (ref 30.0–36.0)
MCV: 84.2 fL (ref 78.0–100.0)
Monocytes Absolute: 0.3 10*3/uL (ref 0.1–1.0)
Monocytes Relative: 6 %
Neutro Abs: 2 10*3/uL (ref 1.7–7.7)
Neutrophils Relative %: 38 %
PLATELETS: 179 10*3/uL (ref 150–400)
RBC: 3.8 MIL/uL — AB (ref 4.22–5.81)
RDW: 17.8 % — ABNORMAL HIGH (ref 11.5–15.5)
WBC: 5.4 10*3/uL (ref 4.0–10.5)

## 2016-10-21 LAB — ECHOCARDIOGRAM COMPLETE

## 2016-10-21 LAB — PHOSPHORUS: PHOSPHORUS: 3.3 mg/dL (ref 2.5–4.6)

## 2016-10-21 MED ORDER — ACETAMINOPHEN 325 MG PO TABS
650.0000 mg | ORAL_TABLET | ORAL | 0 refills | Status: DC | PRN
Start: 2016-10-21 — End: 2017-10-29

## 2016-10-21 MED ORDER — FUROSEMIDE 10 MG/ML IJ SOLN
20.0000 mg | Freq: Once | INTRAMUSCULAR | Status: AC
  Administered 2016-10-21: 20 mg via INTRAVENOUS
  Filled 2016-10-21: qty 2

## 2016-10-21 MED ORDER — INSULIN ASPART PROT & ASPART (70-30 MIX) 100 UNIT/ML ~~LOC~~ SUSP
10.0000 [IU] | Freq: Two times a day (BID) | SUBCUTANEOUS | Status: DC
  Filled 2016-10-21: qty 10

## 2016-10-21 NOTE — Progress Notes (Signed)
Pt given discharge instructions, prescriptions, and care notes. Pt verbalized understanding AEB no further questions or concerns at this time. IV was discontinued, no redness, pain, or swelling noted at this time. Telemetry discontinued and Centralized Telemetry was notified. Pt left the floor via wheelchair with staff in stable condition. 

## 2016-10-21 NOTE — Progress Notes (Signed)
SLP Cancellation Note  Patient Details Name: Michael [name] MRN: [id number] DOB: 1930-03-17   Cancelled treatment:       Reason Eval/Treat Not Completed: SLP screened, no needs identified, will sign off. Pt and family deny acute cognitive linguistic changes. Neurological imaging negative for acute infarction.   Arvil Chaco MA, CCC-SLP Acute Care Speech Language Pathologist    Levi Aland 10/21/2016, 2:27 PM

## 2016-10-21 NOTE — Progress Notes (Signed)
STROKE TEAM PROGRESS NOTE   SUBJECTIVE (INTERVAL HISTORY) One male family member at bedside. He felt right arm and leg better than yesterday. Still has morning stiffness and right hip pain and shoulder pain. MRI C-spine negative for spinal cord abnormalities.   OBJECTIVE Temp:  [98.4 F (36.9 C)-98.6 F (37 C)] 98.6 F (37 C) (09/26 1349) Pulse Rate:  [52-64] 64 (09/26 1349) Cardiac Rhythm: Normal sinus rhythm (09/26 0701) Resp:  [18-20] 20 (09/26 1349) BP: (107-125)/(50-53) 120/53 (09/26 1349) SpO2:  [99 %-100 %] 99 % (09/26 1349)  CBC:   Recent Labs Lab 10/19/16 1338 10/19/16 1356 10/21/16 0812  WBC 7.7  --  5.4  NEUTROABS 4.0  --  2.0  HGB 10.7* 11.9* 10.1*  HCT 33.7* 35.0* 32.0*  MCV 84.7  --  84.2  PLT 212  --  950    Basic Metabolic Panel:   Recent Labs Lab 10/19/16 1338 10/19/16 1356 10/21/16 0812  NA 138 140 139  K 4.1 4.3 3.7  CL 104 103 106  CO2 24  --  27  GLUCOSE 107* 106* 163*  BUN 16 19 10   CREATININE 1.17 1.10 0.90  CALCIUM 8.7*  --  8.3*  MG  --   --  1.8  PHOS  --   --  3.3    Lipid Panel:     Component Value Date/Time   CHOL 95 10/20/2016 0732   TRIG 44 10/20/2016 0732   HDL 52 10/20/2016 0732   CHOLHDL 1.8 10/20/2016 0732   VLDL 9 10/20/2016 0732   LDLCALC 34 10/20/2016 0732   HgbA1c:  Lab Results  Component Value Date   HGBA1C 6.2 (H) 10/20/2016   Urine Drug Screen:     Component Value Date/Time   LABOPIA POSITIVE (A) 10/19/2016 2239   COCAINSCRNUR NONE DETECTED 10/19/2016 2239   LABBENZ NONE DETECTED 10/19/2016 2239   AMPHETMU NONE DETECTED 10/19/2016 2239   THCU NONE DETECTED 10/19/2016 2239   LABBARB NONE DETECTED 10/19/2016 2239    Alcohol Level     Component Value Date/Time   ETH <5 10/19/2016 1338    IMAGING I have personally reviewed the radiological images below and agree with the radiology interpretations.  Ct Angio Head W Or Wo Contrast Ct Angio Neck W Or Wo Contrast 10/20/2016 IMPRESSION: CTA NECK:  1. No hemodynamically significant stenosis or acute vascular process in the neck. CTA HEAD: 1. No emergent large vessel occlusion or significant stenosis. 2. Mild intracranial atherosclerosis. Moderate stenosis LEFT P2, RIGHT M3 segments, LEFT M2 origin. Aortic Atherosclerosis (ICD10-I70.0).  Ct Head Wo Contrast 10/19/2016 IMPRESSION: 1. No acute intracranial process. 2. Old small RIGHT cerebellar infarct ; otherwise negative noncontrast CT HEAD for age.  Mr Brain Wo Contrast 10/20/2016 IMPRESSION: No acute intracranial abnormality. 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  TTE 10/20/2016 - Left ventricle: The cavity size was normal. Wall thickness was   normal. Systolic function was normal. The estimated ejection   fraction was in the range of 60% to 65%. Left ventricular   diastolic function parameters were normal. - Mitral valve: There was mild regurgitation. - Atrial septum: No defect or patent foramen ovale was identified.  Mr Cervical Spine Wo Contrast 10/21/2016 IMPRESSION: 1. No acute osseous abnormality or abnormal cord signal. 2. Moderate cervical spondylosis with multilevel and degenerative. 3. Mild canal stenosis at the C3-4, C4-5, and C5-6 levels. No high-grade canal stenosis. No cord compression. 4. Multilevel mild and moderate foraminal stenosis. Severe left-sided  C5-6 foraminal stenosis.      PHYSICAL EXAM  Temp:  [98.4 F (36.9 C)-98.6 F (37 C)] 98.6 F (37 C) (09/26 1349) Pulse Rate:  [52-64] 64 (09/26 1349) Resp:  [18-20] 20 (09/26 1349) BP: (107-125)/(50-53) 120/53 (09/26 1349) SpO2:  [99 %-100 %] 99 % (09/26 1349)  General - Well nourished, well developed, in no apparent distress.  Ophthalmologic - Fundi not visualized due to small pupils.  Cardiovascular - Regular rate and rhythm.  Mental Status -  Level of arousal and orientation to time, place, and person were intact. Language including expression, naming, repetition,  comprehension was assessed and found intact. Fund of Knowledge was assessed and was intact.  Cranial Nerves II - XII - II - Visual field intact OU. III, IV, VI - Extraocular movements intact. V - subjective right facial decreased light touch sensation, 80-90% of the left, however also complained of right-sided decreased vibration sensation on frontal tuning fork test. VII - Facial movement intact bilaterally. VIII - Hearing & vestibular intact bilaterally. X - Palate elevates symmetrically. XI - Chin turning & shoulder shrug intact bilaterally. XII - Tongue protrusion intact.  Motor Strength - The patient's strength was normal in all extremities except RUE and RLE mild giveaway weakness and pronator drift was absent.  Bulk was normal and fasciculations were absent.   Motor Tone - Muscle tone was assessed at the neck and appendages and was normal.  Reflexes - The patient's reflexes were 1+ in all extremities and he had no pathological reflexes.  Sensory - Light touch, temperature/pinprick were assessed and subjective right decreased sensation, 80-90% of the left.    Coordination - The patient had normal movements in the hands with no ataxia or dysmetria.  Tremor was absent.  Gait and Station - deferred   ASSESSMENT/PLAN Mr. Michael [name] is a 83 y.o. male with history of CLL, diabetes who presents with three-day history of intermittent right-sided weakness, numbness. He did not receive IV t-PA due to arriving outside of the tPA treatment window.   Right shoulder and hip arthritis   Resultant  right hip and shoulder morning stiffness  CT head: no acute stroke  MRI head: no acute stroke was well   CTA head/neck: Moderate L M2 and R M3 stenosis.  No LVO or high-grade stenosis.  2D Echo:  EF 60-65%  MRI C-spine no spinal cord abnormality  will consider outpt patient EMG/NCS to rule out neuropathy versus radiculopathy  LDL 34  HgbA1c 6.2  SCDs for VTE prophylaxis Diet  heart healthy/carb modified Room service appropriate? Yes; Fluid consistency: Thin Diet - low sodium heart healthy  aspirin 325 mg daily prior to admission, now on aspirin 325 mg daily  Patient counseled to be compliant with his antithrombotic medications  Ongoing aggressive stroke risk factor management  Therapy recommendations:  Home health OT/PT  Disposition: pending  Recommend PCP and orthopedic follow-up for possible degenerative arthritis treatment  Hypertension  Stable   homemaking including for torsemide and ramipril  Long-term BP goal normotensive  Diabetes  HgbA1c 6.2, goal < 7.0  Controlled  Other Stroke Risk Factors  CLL on chemo for 2 years  Other Active Problems  None  Hospital day # 1  Neurology will sign off. Please call with questions. Pt will follow up with Cecille Rubin, NP, at [facility] in about 6 weeks. Thanks for the consult.  Rosalin Hawking, MD PhD Stroke Neurology 10/21/2016 6:26 PM   To contact Stroke Continuity provider, please refer to [email or web address]  After hours, contact General Neurology

## 2016-10-21 NOTE — Progress Notes (Signed)
Physical Therapy Treatment Patient Details Name: Michael [name] MRN: [id number] DOB: 1930/05/17 Today's Date: 10/21/2016    History of Present Illness Michael [name] is a 83 y.o. male with a past medical history significant for IDDM, CLL, and HTN who presents with paresthesias. MRI showed old cerebellar CVA but no acute findings    PT Comments    Progressing well.  Still continuing to display signs of stiffness and fatigue.  Should benefit from HHPT to address strengthen, conditioning and pain issues   Follow Up Recommendations  Home health PT     Equipment Recommendations  Rolling walker with 5" wheels    Recommendations for Other Services       Precautions / Restrictions      Mobility  Bed Mobility               General bed mobility comments: NT  Transfers Overall transfer level: Modified independent Equipment used: Rolling walker (2 wheeled)             General transfer comment: pt stood safely from the recliner, no need for assist  Ambulation/Gait Ambulation/Gait assistance: Min guard Ambulation Distance (Feet): 180 Feet Assistive device: Rolling walker (2 wheeled);Quad cane Gait Pattern/deviations: Step-through pattern Gait velocity: decreased Gait velocity interpretation: Below normal speed for age/gender General Gait Details: generally safe and fluid with pt using RW well.  Pt's speed lower than appropriate to age.   Stairs            Wheelchair Mobility    Modified Rankin (Stroke Patients Only)       Balance Overall balance assessment: Needs assistance   Sitting balance-Leahy Scale: Good     Standing balance support: No upper extremity supported Standing balance-Leahy Scale: Fair                              Cognition Arousal/Alertness: Awake/alert Behavior During Therapy: WFL for tasks assessed/performed Overall Cognitive Status: Within Functional Limits for tasks assessed                                         Exercises      General Comments        Pertinent Vitals/Pain Pain Assessment: Faces Faces Pain Scale: Hurts little more Pain Location: R hip Pain Descriptors / Indicators: Aching Pain Intervention(s): Monitored during session    Home Living                      Prior Function            PT Goals (current goals can now be found in the care plan section) Acute Rehab PT Goals Patient Stated Goal: go home PT Goal Formulation: With patient Time For Goal Achievement: 11/03/16 Potential to Achieve Goals: Good Progress towards PT goals: Progressing toward goals    Frequency    Min 3X/week      PT Plan Current plan remains appropriate    Co-evaluation              AM-PAC PT "6 Clicks" Daily Activity  Outcome Measure  Difficulty turning over in bed (including adjusting bedclothes, sheets and blankets)?: None Difficulty moving from lying on back to sitting on the side of the bed? : None Difficulty sitting down on and standing up from a chair with arms (e.g., wheelchair, bedside commode,  etc,.)?: None Help needed moving to and from a bed to chair (including a wheelchair)?: A Little Help needed walking in hospital room?: A Little Help needed climbing 3-5 steps with a railing? : A Little 6 Click Score: 21    End of Session   Activity Tolerance: Patient tolerated treatment well Patient left: in chair;with call bell/phone within reach;with family/visitor present Nurse Communication: Mobility status PT Visit Diagnosis: Unsteadiness on feet (R26.81)     Time: 3662-9476 PT Time Calculation (min) (ACUTE ONLY): 22 min  Charges:  $Gait Training: 8-22 mins                    G Codes:       11-07-16  Donnella Sham, PT [phone]  (pager)   Michael [name] 2016-11-07, 6:04 PM

## 2016-10-21 NOTE — Progress Notes (Signed)
*  PRELIMINARY RESULTS* Echocardiogram 2D Echocardiogram has been performed.  Leavy Cella 10/21/2016, 12:08 PM

## 2016-10-21 NOTE — Care Management Note (Signed)
Case Management Note  Patient Details  Name: Michael [name] MRN: [id number] Date of Birth: 02-07-1930  Subjective/Objective:       Present with paresthesias, hx of IDDM, CLL, and HTN.              [address] (Spouse) Darci Needle (Daughter)    [phone]      PCP: Jani Gravel  Action/Plan: Plan is to d/c to home today pending MRI and echo results.  Expected Discharge Date:   10/21/2016           Expected Discharge Plan:  Home/Self Care  In-House Referral:  NA  Discharge planning Services  CM Consult  Post Acute Care Choice:  NA Choice offered to:  patient  DME Arranged:  Walker rolling DME Agency:  [place]:  PT, OT [facility] Agency:  [facility] [place], referral made pending MD's order. CM has requested orders from MD.  Status of Service:  Completed, signed off  If discussed at [place] of Stay Meetings, dates discussed:    Additional Comments:  Sharin Mons, RN 10/21/2016, 10:30 AM

## 2016-10-21 NOTE — Discharge Summary (Signed)
Physician Discharge Summary  Michael [name] DQQ:[id number] DOB: 02-13-1930 DOA: 10/19/2016  PCP: Jani Gravel, MD  Admit date: 10/19/2016 Discharge date: 10/21/2016  Admitted From: Home Disposition:  Home with Home Health PT/OT  Recommendations for Outpatient Follow-up:  1. Follow up with PCP in 1-2 weeks 2. Follow up with Neurology as an outpatient for EMG/NCS to r/o Neuropathy vs. Radiculopathy 3. Follow up with Orthopedic Surgery for Arthritis Evaluation and for possible Steroid Injections 4. Follow up with Rhuematology as an outpatient 5. Follow up with Oncology Dr. Alvy Bimler for continued management of CLL and Chemotherapy 6. Please obtain CMP/CBC, CMP, Mag, Phos in one week 7. Please follow up on the following pending results:  Home Health: Yes  Equipment/Devices: Conservation officer, nature with 5" Wheels   Discharge Condition: Stable CODE STATUS: FULL CODE Diet recommendation: Heart Healthy/Carb Modified Diet  Brief/Interim Summary: Michael [name] is a 83 y.o. male with a past medical history significant for IDDM, CLL, and HTN who presents with paresthesias and stiffness of Right arm and Leg in the AM. The patient was in his usual state of health until about 3-4 days ago when he started to notice "tingling from the tips of my hand to the tips of my toes on the right".  This would start gradually whenever he lay down to sleep, would get progressively worse all night until morning when it would be severe, so that for an hour or two the past few mornings he could barely walk until the tingling went away.  Then today he talked to his daughter who is a Marine scientist and was afraid it was a stroke and so he came to the ER.  No focal weakness, no slurred speech, no ataxia, no facial droop.  No fever, chills, cough, sputum production, dysuria, hematuria, flank pain.  No dyspnea, chest pain, orthopnea, PND, change in his chronic leg swelling. Patient complains of Right sided pain and paraesthesias with inability to  move it when he first wakes up. Neurology was consulted for further evaluation and felt he did not have a CVA but felt he had right shoulder and Hip Arthritis. An MRI of the Neck was ordered which showed no spinal cord abnormality but he had some mild canal stenosis and mulitlevel mild and moderate foraminal stenosis. PT evaluated and recommended Home Health. Patient was deemed medically stable to D/C Home with [facility] and will need to follow up with PCP, Orthopedics, Rheumatology, and Neurology as an outpatient. Patient will also need to follow up with his Oncologist for continued management of CLL.   Discharge Diagnoses:  Principal Problem:   Paresthesias Active Problems:   CLL (chronic lymphocytic leukemia) (HCC)   Anemia in neoplastic disease   Bilateral leg edema   Essential hypertension  Parasthesias/Polyneuropathy/Arthritis of Shoulder and Hip -Temporal pattern (progressive with laying down) not typical for stroke, but this is obvious concern in this patient with DM and HTN. -Patient describes Morning Stiffness   -MRI Brain showed No acute intracranial abnormality. 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  -MRI C-Spine showed No acute osseous abnormality or abnormal cord signal. Moderate cervical spondylosis with multilevel and degenerative. ild canal stenosis at the C3-4, C4-5, and C5-6 levels. No high-grade canal stenosis. No cord compression. Multilevel mild and moderate foraminal stenosis. Severe left-sided C5-6 foraminal stenosis -Other considerations include polyneuropathy from his CLL medicine, or other polyneuropathy. Neurology recommending Outpatient EMG-NCS to r/o Neuropathy vs. Radiculopathy in 6 weeks -Recommending Outpatient Orthopedic Eval and will need to  see Rheumatology as an outapatinet  -Neuro checks, NIHSS per protocol -C/w aspirin 325 mg for now, further recs per Neurology pending MRI -Permissive hypertension for now, restart ramipril  if MRI normal -Lipids showed LDL of 34, hemoglobin A1c was 6.2 -Echocardiogram showed EF of 60-65% -PT/OT recommend Home Health PT with [facility] to Neurology, appreciated recommendations -Follow up with PCP and Ortho as an outpatient   Leg swelling Chronic -Checked BNP and was 187 -Continue torsemide at home; Given IV Furosemide 20 mg once  HTN -C/w Home Ramipril  Diabetes Mellitus Type 2 Stable.  -Continue 70/30 and oral Meformin  -Had Hypoglycemic episode this AM but likley from too much 70/30 as patient does not take that regularly   CLL -C/w Chemotherapy per Oncology -Follow up with Dr. Alvy Bimler as an outpatient  Discharge Instructions  Discharge Instructions    Ambulatory referral to Neurology    Complete by:  As directed    An appointment is requested in approximately: 1-2 weeks for EMG-NCS   Call MD for:  difficulty breathing, headache or visual disturbances    Complete by:  As directed    Call MD for:  extreme fatigue    Complete by:  As directed    Call MD for:  hives    Complete by:  As directed    Call MD for:  persistant dizziness or light-headedness    Complete by:  As directed    Call MD for:  persistant nausea and vomiting    Complete by:  As directed    Call MD for:  redness, tenderness, or signs of infection (pain, swelling, redness, odor or green/yellow discharge around incision site)    Complete by:  As directed    Call MD for:  severe uncontrolled pain    Complete by:  As directed    Call MD for:  temperature >100.4    Complete by:  As directed    Diet - low sodium heart healthy    Complete by:  As directed    Discharge instructions    Complete by:  As directed    Follow up with PCP, Oncology, Orthopedic Surgery, Rhuematology and Neurology as an outpatient. Have EMG-NCS for evaluation at Neurology office. Take all medications as prescribed. If symptoms change or worsen please return to the ED for evaluation.   Increase  activity slowly    Complete by:  As directed      Allergies as of 10/21/2016   No Known Allergies     Medication List    STOP taking these medications   oxyCODONE-acetaminophen 5-325 MG tablet Commonly known as:  PERCOCET/ROXICET     TAKE these medications   acetaminophen 325 MG tablet Commonly known as:  TYLENOL Take 2 tablets (650 mg total) by mouth every 4 (four) hours as needed for mild pain (or temp > 37.5 C (99.5 F)).   aspirin 325 MG tablet Take 325 mg by mouth daily.   ibrutinib 140 MG capsul Commonly known as:  IMBRUVICA Take 2 capsules (280 mg total) by mouth daily.   insulin NPH-regular Human (70-30) 100 UNIT/ML injection Commonly known as:  NOVOLIN 70/30 Inject 15-20 Units into the skin 2 (two) times daily as needed (CBG >100). Sliding scale   metFORMIN 1000 MG tablet Commonly known as:  GLUCOPHAGE Take 1,000 mg by mouth 2 (two) times daily with a meal.   morphine 15 MG tablet Commonly known as:  MSIR Take 15 mg by mouth every 6 (  six) hours as needed for severe pain.   multivitamin with minerals Tabs tablet Take 1 tablet by mouth daily.   pantoprazole 40 MG tablet Commonly known as:  PROTONIX Take 1 tablet (40 mg total) by mouth daily.   ramipril 5 MG capsule Commonly known as:  ALTACE Take 5 mg by mouth daily.   tamsulosin 0.4 MG Caps capsule Commonly known as:  FLOMAX Take 0.4 mg by mouth daily after supper.   torsemide 20 MG tablet Commonly known as:  DEMADEX Take 20 mg by mouth daily after supper.            Durable Medical Equipment        Start     Ordered   10/21/16 1430  For home use only DME Walker rolling  [facility])  Once    Question:  Patient needs a walker to treat with the following condition  Answer:  Weakness   10/21/16 1434   10/20/16 1611  For home use only DME Walker rolling  Once    Question:  Patient needs a walker to treat with the following condition  Answer:  Polyneuropathy   10/20/16 1611       Discharge  Care Instructions        Start     Ordered   10/21/16 0000  acetaminophen (TYLENOL) 325 MG tablet  Every 4 hours PRN     10/21/16 1434   10/21/16 0000  Ambulatory referral to Neurology    Comments:  An appointment is requested in approximately: 1-2 weeks for EMG-NCS   10/21/16 1434   10/21/16 0000  Increase activity slowly     10/21/16 1434   10/21/16 0000  Diet - low sodium heart healthy     10/21/16 1434   10/21/16 0000  Call MD for:  temperature >100.4     10/21/16 1434   10/21/16 0000  Call MD for:  persistant nausea and vomiting     10/21/16 1434   10/21/16 0000  Call MD for:  severe uncontrolled pain     10/21/16 1434   10/21/16 0000  Call MD for:  redness, tenderness, or signs of infection (pain, swelling, redness, odor or green/yellow discharge around incision site)     10/21/16 1434   10/21/16 0000  Call MD for:  difficulty breathing, headache or visual disturbances     10/21/16 1434   10/21/16 0000  Call MD for:  hives     10/21/16 1434   10/21/16 0000  Call MD for:  persistant dizziness or light-headedness     10/21/16 1434   10/21/16 0000  Call MD for:  extreme fatigue     10/21/16 1434   10/21/16 0000  Discharge instructions    Comments:  Follow up with PCP, Oncology, Orthopedic Surgery, Rhuematology and Neurology as an outpatient. Have EMG-NCS for evaluation at Neurology office. Take all medications as prescribed. If symptoms change or worsen please return to the ED for evaluation.   10/21/16 [facility] Follow up.   Why:  rolling walker to be delivered to room prior to discharge Contact information: [place] [address] [phone]        [facility] Follow up.   Specialty:  Home Health Services Why:  home health services arranged, office will call and set up home visits Contact information: [address] [place] [address] [phone]  Jani Gravel, MD. Call in 1 week(s).   Specialty:  Internal Medicine Why:  Follow up within 1 week Contact information: [address] [place] [address] [phone]        Suella Broad, MD. Call.   Specialty:  Physical Medicine and Rehabilitation Why:  Follow up for Arthritis and Morning Stiffness for possible injections.  Contact information: [address] [place] [address] [phone]        Gavin Pound, MD Follow up.   Specialty:  Rheumatology Why:  Have PCP refer to Dr. Trudie Reed office for Evaluation of Morning stiffness and Arthritis  Contact information: [address] [place] [address] [phone]        Dennie Bible, NP. Schedule an appointment as soon as possible for a visit in 6 week(s).   Specialty:  Family Medicine Contact information: [address] [place] [address] [phone]          No Known Allergies  Consultations:  Neurology  Procedures/Studies: Ct Angio Head W Or Wo Contrast  Result Date: 10/20/2016 CLINICAL DATA:  RIGHT-sided weakness and dizziness. Symptoms predominately resolved. Frontal headache. EXAM: CT ANGIOGRAPHY HEAD AND NECK TECHNIQUE: Multidetector CT imaging of the head and neck was performed using the standard protocol during bolus administration of intravenous contrast. Multiplanar CT image reconstructions and MIPs were obtained to evaluate the vascular anatomy. Carotid stenosis measurements (when applicable) are obtained utilizing NASCET criteria, using the distal internal carotid diameter as the denominator. CONTRAST:  50 cc Isovue 370 COMPARISON:  CT HEAD and MRI head October 19, 2016 FINDINGS: CTA NECK AORTIC ARCH: Normal appearance of the thoracic arch, normal branch pattern. Mild calcific atherosclerosis arch vessel origins. The origins of the innominate, left Common carotid artery and subclavian artery are widely patent. RIGHT CAROTID  SYSTEM: Common carotid artery is widely patent, coursing in a straight line fashion. Normal appearance of the carotid bifurcation without hemodynamically significant stenosis by NASCET criteria. RIGHT tonsillar loop. Internal carotid artery is widely patent. LEFT CAROTID SYSTEM: Common carotid artery is widely patent, coursing in a straight line fashion. Normal appearance of the carotid bifurcation without hemodynamically significant stenosis by NASCET criteria, trace calcific atherosclerosis. Normal appearance of the included internal carotid artery. VERTEBRAL ARTERIES:Codominant vertebral artery is. Normal appearance of the vertebral arteries, which appear widely patent. SKELETON: No acute osseous process though bone windows have not been submitted. Multilevel moderate to severe degenerative disc. Moderate LEFT C5-6 neural foraminal narrowing. Mildly sclerotic axial skeleton, nonspecific. OTHER NECK: Soft tissues of the neck are nonacute though, not tailored for evaluation. UPPER CHEST: Included lung apices are clear. Mild centrilobular emphysema, incompletely evaluated. No superior mediastinal lymphadenopathy. CTA HEAD ANTERIOR CIRCULATION: Patent cervical internal carotid arteries, petrous, cavernous and supra clinoid internal carotid arteries. Widely patent anterior communicating artery. Patent anterior and middle cerebral arteries, mild luminal irregularity compatible with atherosclerosis. Moderate stenosis RIGHT M3 segment and LEFT M2 superior segment origin. No large vessel occlusion, significant stenosis, contrast extravasation or aneurysm. POSTERIOR CIRCULATION: Fenestrated LEFT posterior-inferior cerebellar artery origin. Patent vertebral arteries, vertebrobasilar junction and basilar artery, as well as main branch vessels. Patent posterior cerebral arteries, mild luminal regularity compatible with atherosclerosis. Moderate stenosis LEFT P2 segment. No large vessel occlusion, significant stenosis,  contrast extravasation or aneurysm. VENOUS SINUSES: Major dural venous sinuses are patent though not tailored for evaluation on this angiographic examination. ANATOMIC VARIANTS: Hypoplastic RIGHT A1 segment. DELAYED PHASE: No abnormal intracranial enhancement. MIP images reviewed.  IMPRESSION: CTA NECK: 1. No hemodynamically significant stenosis or acute vascular process in the neck. CTA HEAD: 1. No emergent large vessel occlusion or significant stenosis. 2. Mild intracranial atherosclerosis. Moderate stenosis LEFT P2, RIGHT M3 segments, LEFT M2 origin. Aortic Atherosclerosis (ICD10-I70.0). Electronically Signed   By: Elon Alas M.D.   On: 10/20/2016 13:56   Ct Head Wo Contrast  Result Date: 10/19/2016 CLINICAL DATA:  Dizziness for 1 month. History of leukemia and diabetes. EXAM: CT HEAD WITHOUT CONTRAST TECHNIQUE: Contiguous axial images were obtained from the base of the skull through the vertex without intravenous contrast. COMPARISON:  CT HEAD March 04, 2016 FINDINGS: BRAIN: No intraparenchymal hemorrhage, mass effect nor midline shift. The ventricles and sulci are normal for age. Patchy supratentorial white matter hypodensities less than expected for patient's age, though non-specific are most compatible with chronic small vessel ischemic disease. Old small RIGHT cerebellar infarct. No acute large vascular territory infarcts. No abnormal extra-axial fluid collections. Basal cisterns are patent. VASCULAR: Mild calcific atherosclerosis of the carotid siphons. SKULL: No skull fracture. No significant scalp soft tissue swelling. SINUSES/ORBITS: Mild paranasal sinus mucosal thickening. Mastoid air cells are well aerated.The included ocular globes and orbital contents are non-suspicious. Status post bilateral ocular lens implants. OTHER: None. IMPRESSION: 1. No acute intracranial process. 2. Old small RIGHT cerebellar infarct ; otherwise negative noncontrast CT HEAD for age. Electronically Signed   By:  Elon Alas M.D.   On: 10/19/2016 15:49   Ct Angio Neck W Or Wo Contrast  Result Date: 10/20/2016 CLINICAL DATA:  RIGHT-sided weakness and dizziness. Symptoms predominately resolved. Frontal headache. EXAM: CT ANGIOGRAPHY HEAD AND NECK TECHNIQUE: Multidetector CT imaging of the head and neck was performed using the standard protocol during bolus administration of intravenous contrast. Multiplanar CT image reconstructions and MIPs were obtained to evaluate the vascular anatomy. Carotid stenosis measurements (when applicable) are obtained utilizing NASCET criteria, using the distal internal carotid diameter as the denominator. CONTRAST:  50 cc Isovue 370 COMPARISON:  CT HEAD and MRI head October 19, 2016 FINDINGS: CTA NECK AORTIC ARCH: Normal appearance of the thoracic arch, normal branch pattern. Mild calcific atherosclerosis arch vessel origins. The origins of the innominate, left Common carotid artery and subclavian artery are widely patent. RIGHT CAROTID SYSTEM: Common carotid artery is widely patent, coursing in a straight line fashion. Normal appearance of the carotid bifurcation without hemodynamically significant stenosis by NASCET criteria. RIGHT tonsillar loop. Internal carotid artery is widely patent. LEFT CAROTID SYSTEM: Common carotid artery is widely patent, coursing in a straight line fashion. Normal appearance of the carotid bifurcation without hemodynamically significant stenosis by NASCET criteria, trace calcific atherosclerosis. Normal appearance of the included internal carotid artery. VERTEBRAL ARTERIES:Codominant vertebral artery is. Normal appearance of the vertebral arteries, which appear widely patent. SKELETON: No acute osseous process though bone windows have not been submitted. Multilevel moderate to severe degenerative disc. Moderate LEFT C5-6 neural foraminal narrowing. Mildly sclerotic axial skeleton, nonspecific. OTHER NECK: Soft tissues of the neck are nonacute though, not  tailored for evaluation. UPPER CHEST: Included lung apices are clear. Mild centrilobular emphysema, incompletely evaluated. No superior mediastinal lymphadenopathy. CTA HEAD ANTERIOR CIRCULATION: Patent cervical internal carotid arteries, petrous, cavernous and supra clinoid internal carotid arteries. Widely patent anterior communicating artery. Patent anterior and middle cerebral arteries, mild luminal irregularity compatible with atherosclerosis. Moderate stenosis RIGHT M3 segment and LEFT M2 superior segment origin. No large vessel occlusion, significant stenosis, contrast extravasation or aneurysm. POSTERIOR CIRCULATION: Fenestrated LEFT posterior-inferior cerebellar artery origin. Patent  vertebral arteries, vertebrobasilar junction and basilar artery, as well as main branch vessels. Patent posterior cerebral arteries, mild luminal regularity compatible with atherosclerosis. Moderate stenosis LEFT P2 segment. No large vessel occlusion, significant stenosis, contrast extravasation or aneurysm. VENOUS SINUSES: Major dural venous sinuses are patent though not tailored for evaluation on this angiographic examination. ANATOMIC VARIANTS: Hypoplastic RIGHT A1 segment. DELAYED PHASE: No abnormal intracranial enhancement. MIP images reviewed. IMPRESSION: CTA NECK: 1. No hemodynamically significant stenosis or acute vascular process in the neck. CTA HEAD: 1. No emergent large vessel occlusion or significant stenosis. 2. Mild intracranial atherosclerosis. Moderate stenosis LEFT P2, RIGHT M3 segments, LEFT M2 origin. Aortic Atherosclerosis (ICD10-I70.0). Electronically Signed   By: Elon Alas M.D.   On: 10/20/2016 13:56   Mr Brain Wo Contrast  Result Date: 10/20/2016 CLINICAL DATA:  Right arm and leg numbness EXAM: MRI HEAD WITHOUT CONTRAST TECHNIQUE: Multiplanar, multiecho pulse sequences of the brain and surrounding structures were obtained without intravenous contrast. COMPARISON:  Head CT 10/19/2016 FINDINGS:  Brain: The midline structures are normal. There is no focal diffusion restriction to indicate acute infarct. The brain parenchymal signal is normal and there is no mass lesion. No intraparenchymal hematoma or chronic microhemorrhage. Brain volume is normal for age without lobar predominant atrophy. The dura is normal and there is no extra-axial collection. Vascular: Major intracranial arterial and venous sinus flow voids are preserved. Skull and upper cervical spine: The visualized skull base, calvarium, upper cervical spine and extracranial soft tissues are normal. Sinuses/Orbits: No fluid levels or advanced mucosal thickening. Small amount of left mastoid fluid. Bilateral lens replacements. IMPRESSION: No acute intracranial abnormality. 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 Electronically Signed   By: Ulyses Jarred M.D.   On: 10/20/2016 00:17   Mr Cervical Spine Wo Contrast  Result Date: 10/21/2016 CLINICAL DATA:  83 y/o M; 2 months of right arm and leg weakness. Chronic neck pain. EXAM: MRI CERVICAL SPINE WITHOUT CONTRAST TECHNIQUE: Multiplanar, multisequence MR imaging of the cervical spine was performed. No intravenous contrast was administered. COMPARISON:  03/04/2016 CT cervical spine. FINDINGS: Alignment: Straightening of cervical lordosis.  No listhesis. Vertebrae: No fracture, evidence of discitis, or bone lesion. Cord: No abnormal cord signal. Posterior Fossa, vertebral arteries, paraspinal tissues: Negative. Disc levels: C2-3: Mild bilateral uncovertebral and facet hypertrophy with mild left neural foraminal stenosis. No significant canal stenosis. C3-4: Disc osteophyte complex eccentric to the right subarticular zone and right greater than left uncovertebral and facet hypertrophy. Mild left and moderate right foraminal stenosis. Mild canal stenosis with right anterior cord contact and mild flattening. C4-5: Disc osteophyte complex eccentric to the right and  predominant left-sided uncovertebral and facet hypertrophy. Mild left foraminal stenosis and mild canal stenosis. C5-6: Disc osteophyte complex with left-greater-than-right uncovertebral and facet hypertrophy. Moderate right and severe left foraminal stenosis. Mild canal stenosis. C6-7: Disc osteophyte complex bilateral uncovertebral and facet hypertrophy. Mild bilateral foraminal stenosis. No canal stenosis. C7-T1: No significant disc displacement, foraminal stenosis, or canal stenosis. IMPRESSION: 1. No acute osseous abnormality or abnormal cord signal. 2. Moderate cervical spondylosis with multilevel and degenerative. 3. Mild canal stenosis at the C3-4, C4-5, and C5-6 levels. No high-grade canal stenosis. No cord compression. 4. Multilevel mild and moderate foraminal stenosis. Severe left-sided C5-6 foraminal stenosis. Electronically Signed   By: Kristine Garbe M.D.   On: 10/21/2016 05:02   Ct Abdomen Pelvis W Contrast  Result Date: 10/09/2016 CLINICAL DATA:  Follow-up CLL, Imbruvica ongoing EXAM: CT ABDOMEN AND  PELVIS WITH CONTRAST TECHNIQUE: Multidetector CT imaging of the abdomen and pelvis was performed using the standard protocol following bolus administration of intravenous contrast. CONTRAST:  16mL ISOVUE-300 IOPAMIDOL (ISOVUE-300) INJECTION 61% COMPARISON:  CT chest abdomen pelvis dated 01/08/2016 FINDINGS: Lower chest: Calcified granuloma in the left lower lobe (series 6/ image 17), benign. Hepatobiliary: 1.7 cm benign hemangioma in the posterior segment right hepatic lobe (series 2/ image 24). Additional 8 mm cyst versus hemangioma in the posterior right hepatic dome (series 2/image 15). Gallbladder is unremarkable. No intrahepatic or extrahepatic ductal dilatation. Pancreas: Within normal limits. Spleen: Spleen is normal size. Adrenals/Urinary Tract: Adrenal glands are within normal limits. Small bilateral renal cysts measuring up to 14 mm in the right upper kidney (series 2/image 28).  No hydronephrosis. Bladder is mildly thick-walled although underdistended. Stomach/Bowel: Stomach is within normal limits. No evidence of bowel obstruction. Appendix is not discretely visualized. Mild to moderate colonic stool burden. Vascular/Lymphatic: No evidence of abdominal aortic aneurysm. Mild atherosclerotic calcifications the abdominal aorta. No suspicious abdominopelvic lymphadenopathy. Reproductive: Prostate is unremarkable. Other: No abdominopelvic ascites. Moderate to large left inguinal/ scrotal hernia containing multiple loops of small bowel and fat (series 2/image 87). Tiny fat containing right inguinal hernia (series 2/image 75). Musculoskeletal: Mild degenerative changes of the visualized thoracolumbar spine. IMPRESSION: No findings suspicious for active lymphomatous involvement. No abdominopelvic lymphadenopathy.  Spleen is normal in size. Moderate to large left inguinal/scrotal hernia containing small bowel and fat. No evidence of bowel obstruction. Additional stable ancillary findings as above. Electronically Signed   By: Julian Hy M.D.   On: 10/09/2016 14:19   ECHOCARDIOGRAM Study Conclusions  - Left ventricle: The cavity size was normal. Wall thickness was   normal. Systolic function was normal. The estimated ejection   fraction was in the range of 60% to 65%. Left ventricular   diastolic function parameters were normal. - Mitral valve: There was mild regurgitation. - Atrial septum: No defect or patent foramen ovale was identified.  Subjective: Seen and examined at beside and states paraesthesias resolved and states it has been happening when he first gets up in the AM and his Right side is stiff. No CP or SOB. Denies any other complaints and ready to go home.  Discharge Exam: Vitals:   10/21/16 0543 10/21/16 1349  BP: (!) 125/50 (!) 120/53  Pulse: (!) 52 64  Resp:  20  Temp: 98.5 F (36.9 C) 98.6 F (37 C)  SpO2: 100% 99%   Vitals:   10/20/16 1612 10/20/16  2125 10/21/16 0543 10/21/16 1349  BP: (!) 119/51 (!) 107/53 (!) 125/50 (!) 120/53  Pulse: (!) 56 (!) 55 (!) 52 64  Resp: 18 18  20   Temp: 98.9 F (37.2 C) 98.4 F (36.9 C) 98.5 F (36.9 C) 98.6 F (37 C)  TempSrc:  Oral Oral Oral  SpO2: 100% 100% 100% 99%   General: Pt is alert, awake, not in acute distress Cardiovascular: RRR, S1/S2 +, no rubs, no gallops Respiratory: CTA bilaterally, no wheezing, no rhonchi Abdominal: Soft, NT, ND, bowel sounds + Extremities: 1-2+ LEedema, no cyanosis  The results of significant diagnostics from this hospitalization (including imaging, microbiology, ancillary and laboratory) are listed below for reference.    Microbiology: No results found for this or any previous visit (from the past 240 hour(s)).   Labs: BNP (last 3 results)  Recent Labs  10/20/16 0732  BNP 144.8*   Basic Metabolic Panel:  Recent Labs Lab 10/19/16 1338 10/19/16 1356 10/21/16 0812  NA  138 140 139  K 4.1 4.3 3.7  CL 104 103 106  CO2 24  --  27  GLUCOSE 107* 106* 163*  BUN 16 19 10   CREATININE 1.17 1.10 0.90  CALCIUM 8.7*  --  8.3*  MG  --   --  1.8  PHOS  --   --  3.3   Liver Function Tests:  Recent Labs Lab 10/19/16 1338 10/21/16 0812  AST 23 17  ALT 12* 10*  ALKPHOS 40 32*  BILITOT 0.5 0.4  PROT 5.4* 4.8*  ALBUMIN 3.2* 2.7*   No results for input(s): LIPASE, AMYLASE in the last 168 hours. No results for input(s): AMMONIA in the last 168 hours. CBC:  Recent Labs Lab 10/19/16 1338 10/19/16 1356 10/21/16 0812  WBC 7.7  --  5.4  NEUTROABS 4.0  --  2.0  HGB 10.7* 11.9* 10.1*  HCT 33.7* 35.0* 32.0*  MCV 84.7  --  84.2  PLT 212  --  179   Cardiac Enzymes: No results for input(s): CKTOTAL, CKMB, CKMBINDEX, TROPONINI in the last 168 hours. BNP: Invalid input(s): POCBNP CBG:  Recent Labs Lab 10/20/16 2124 10/21/16 0132 10/21/16 0233 10/21/16 0758 10/21/16 1151  GLUCAP 98 38* 142* 158* 192*   D-Dimer No results for input(s):  DDIMER in the last 72 hours. Hgb A1c  Recent Labs  10/20/16 0732  HGBA1C 6.2*   Lipid Profile  Recent Labs  10/20/16 0732  CHOL 95  HDL 52  LDLCALC 34  TRIG 44  CHOLHDL 1.8   Thyroid function studies No results for input(s): TSH, T4TOTAL, T3FREE, THYROIDAB in the last 72 hours.  Invalid input(s): FREET3 Anemia work up No results for input(s): VITAMINB12, FOLATE, FERRITIN, TIBC, IRON, RETICCTPCT in the last 72 hours. Urinalysis    Component Value Date/Time   COLORURINE YELLOW 10/19/2016 2240   APPEARANCEUR CLEAR 10/19/2016 2240   LABSPEC 1.013 10/19/2016 2240   PHURINE 6.0 10/19/2016 2240   GLUCOSEU 50 (A) 10/19/2016 2240   HGBUR SMALL (A) 10/19/2016 2240   BILIRUBINUR NEGATIVE 10/19/2016 2240   KETONESUR NEGATIVE 10/19/2016 2240   PROTEINUR NEGATIVE 10/19/2016 2240   UROBILINOGEN 0.2 02/16/2007 0834   NITRITE NEGATIVE 10/19/2016 2240   LEUKOCYTESUR NEGATIVE 10/19/2016 2240   Sepsis Labs Invalid input(s): PROCALCITONIN,  WBC,  LACTICIDVEN Microbiology No results found for this or any previous visit (from the past 240 hour(s)).  Time coordinating discharge: 35 minutes  SIGNED:  Kerney Elbe, DO [facility] 10/21/2016, 2:35 PM Pager [phone]  If 7PM-7AM, please contact night-coverage [email or web address] Password [email or web address]

## 2016-10-23 DIAGNOSIS — D649 Anemia, unspecified: Secondary | ICD-10-CM | POA: Diagnosis not present

## 2016-10-23 DIAGNOSIS — G8929 Other chronic pain: Secondary | ICD-10-CM | POA: Diagnosis not present

## 2016-10-23 DIAGNOSIS — M1611 Unilateral primary osteoarthritis, right hip: Secondary | ICD-10-CM | POA: Diagnosis not present

## 2016-10-23 DIAGNOSIS — K4090 Unilateral inguinal hernia, without obstruction or gangrene, not specified as recurrent: Secondary | ICD-10-CM | POA: Diagnosis not present

## 2016-10-23 DIAGNOSIS — E119 Type 2 diabetes mellitus without complications: Secondary | ICD-10-CM | POA: Diagnosis not present

## 2016-10-23 DIAGNOSIS — C9110 Chronic lymphocytic leukemia of B-cell type not having achieved remission: Secondary | ICD-10-CM | POA: Diagnosis not present

## 2016-10-23 DIAGNOSIS — E441 Mild protein-calorie malnutrition: Secondary | ICD-10-CM | POA: Diagnosis not present

## 2016-10-23 DIAGNOSIS — I10 Essential (primary) hypertension: Secondary | ICD-10-CM | POA: Diagnosis not present

## 2016-10-23 DIAGNOSIS — M19011 Primary osteoarthritis, right shoulder: Secondary | ICD-10-CM | POA: Diagnosis not present

## 2016-10-23 DIAGNOSIS — M25512 Pain in left shoulder: Secondary | ICD-10-CM | POA: Diagnosis not present

## 2016-10-26 DIAGNOSIS — Z13820 Encounter for screening for osteoporosis: Secondary | ICD-10-CM | POA: Diagnosis not present

## 2016-10-26 DIAGNOSIS — Z79899 Other long term (current) drug therapy: Secondary | ICD-10-CM | POA: Diagnosis not present

## 2016-10-26 DIAGNOSIS — E7800 Pure hypercholesterolemia, unspecified: Secondary | ICD-10-CM | POA: Diagnosis not present

## 2016-10-26 DIAGNOSIS — E118 Type 2 diabetes mellitus with unspecified complications: Secondary | ICD-10-CM | POA: Diagnosis not present

## 2016-10-27 DIAGNOSIS — E119 Type 2 diabetes mellitus without complications: Secondary | ICD-10-CM | POA: Diagnosis not present

## 2016-10-27 DIAGNOSIS — E441 Mild protein-calorie malnutrition: Secondary | ICD-10-CM | POA: Diagnosis not present

## 2016-10-27 DIAGNOSIS — M25512 Pain in left shoulder: Secondary | ICD-10-CM | POA: Diagnosis not present

## 2016-10-27 DIAGNOSIS — C9110 Chronic lymphocytic leukemia of B-cell type not having achieved remission: Secondary | ICD-10-CM | POA: Diagnosis not present

## 2016-10-27 DIAGNOSIS — M19011 Primary osteoarthritis, right shoulder: Secondary | ICD-10-CM | POA: Diagnosis not present

## 2016-10-27 DIAGNOSIS — G8929 Other chronic pain: Secondary | ICD-10-CM | POA: Diagnosis not present

## 2016-10-27 DIAGNOSIS — I10 Essential (primary) hypertension: Secondary | ICD-10-CM | POA: Diagnosis not present

## 2016-10-27 DIAGNOSIS — D649 Anemia, unspecified: Secondary | ICD-10-CM | POA: Diagnosis not present

## 2016-10-27 DIAGNOSIS — K4090 Unilateral inguinal hernia, without obstruction or gangrene, not specified as recurrent: Secondary | ICD-10-CM | POA: Diagnosis not present

## 2016-10-27 DIAGNOSIS — M1611 Unilateral primary osteoarthritis, right hip: Secondary | ICD-10-CM | POA: Diagnosis not present

## 2016-10-28 DIAGNOSIS — C9110 Chronic lymphocytic leukemia of B-cell type not having achieved remission: Secondary | ICD-10-CM | POA: Diagnosis not present

## 2016-10-28 DIAGNOSIS — G8929 Other chronic pain: Secondary | ICD-10-CM | POA: Diagnosis not present

## 2016-10-28 DIAGNOSIS — D649 Anemia, unspecified: Secondary | ICD-10-CM | POA: Diagnosis not present

## 2016-10-28 DIAGNOSIS — K4090 Unilateral inguinal hernia, without obstruction or gangrene, not specified as recurrent: Secondary | ICD-10-CM | POA: Diagnosis not present

## 2016-10-28 DIAGNOSIS — I10 Essential (primary) hypertension: Secondary | ICD-10-CM | POA: Diagnosis not present

## 2016-10-28 DIAGNOSIS — E441 Mild protein-calorie malnutrition: Secondary | ICD-10-CM | POA: Diagnosis not present

## 2016-10-28 DIAGNOSIS — M25512 Pain in left shoulder: Secondary | ICD-10-CM | POA: Diagnosis not present

## 2016-10-28 DIAGNOSIS — M19011 Primary osteoarthritis, right shoulder: Secondary | ICD-10-CM | POA: Diagnosis not present

## 2016-10-28 DIAGNOSIS — M1611 Unilateral primary osteoarthritis, right hip: Secondary | ICD-10-CM | POA: Diagnosis not present

## 2016-10-28 DIAGNOSIS — E119 Type 2 diabetes mellitus without complications: Secondary | ICD-10-CM | POA: Diagnosis not present

## 2016-10-29 DIAGNOSIS — K4090 Unilateral inguinal hernia, without obstruction or gangrene, not specified as recurrent: Secondary | ICD-10-CM | POA: Diagnosis not present

## 2016-10-29 DIAGNOSIS — M19011 Primary osteoarthritis, right shoulder: Secondary | ICD-10-CM | POA: Diagnosis not present

## 2016-10-29 DIAGNOSIS — E119 Type 2 diabetes mellitus without complications: Secondary | ICD-10-CM | POA: Diagnosis not present

## 2016-10-29 DIAGNOSIS — D649 Anemia, unspecified: Secondary | ICD-10-CM | POA: Diagnosis not present

## 2016-10-29 DIAGNOSIS — E441 Mild protein-calorie malnutrition: Secondary | ICD-10-CM | POA: Diagnosis not present

## 2016-10-29 DIAGNOSIS — M1611 Unilateral primary osteoarthritis, right hip: Secondary | ICD-10-CM | POA: Diagnosis not present

## 2016-10-29 DIAGNOSIS — I10 Essential (primary) hypertension: Secondary | ICD-10-CM | POA: Diagnosis not present

## 2016-10-29 DIAGNOSIS — G8929 Other chronic pain: Secondary | ICD-10-CM | POA: Diagnosis not present

## 2016-10-29 DIAGNOSIS — M25512 Pain in left shoulder: Secondary | ICD-10-CM | POA: Diagnosis not present

## 2016-10-29 DIAGNOSIS — C9110 Chronic lymphocytic leukemia of B-cell type not having achieved remission: Secondary | ICD-10-CM | POA: Diagnosis not present

## 2016-10-30 DIAGNOSIS — I10 Essential (primary) hypertension: Secondary | ICD-10-CM | POA: Diagnosis not present

## 2016-10-30 DIAGNOSIS — D649 Anemia, unspecified: Secondary | ICD-10-CM | POA: Diagnosis not present

## 2016-10-30 DIAGNOSIS — M1611 Unilateral primary osteoarthritis, right hip: Secondary | ICD-10-CM | POA: Diagnosis not present

## 2016-10-30 DIAGNOSIS — G8929 Other chronic pain: Secondary | ICD-10-CM | POA: Diagnosis not present

## 2016-10-30 DIAGNOSIS — M25512 Pain in left shoulder: Secondary | ICD-10-CM | POA: Diagnosis not present

## 2016-10-30 DIAGNOSIS — C9110 Chronic lymphocytic leukemia of B-cell type not having achieved remission: Secondary | ICD-10-CM | POA: Diagnosis not present

## 2016-10-30 DIAGNOSIS — E441 Mild protein-calorie malnutrition: Secondary | ICD-10-CM | POA: Diagnosis not present

## 2016-10-30 DIAGNOSIS — M19011 Primary osteoarthritis, right shoulder: Secondary | ICD-10-CM | POA: Diagnosis not present

## 2016-10-30 DIAGNOSIS — K4090 Unilateral inguinal hernia, without obstruction or gangrene, not specified as recurrent: Secondary | ICD-10-CM | POA: Diagnosis not present

## 2016-10-30 DIAGNOSIS — E119 Type 2 diabetes mellitus without complications: Secondary | ICD-10-CM | POA: Diagnosis not present

## 2016-11-02 DIAGNOSIS — E441 Mild protein-calorie malnutrition: Secondary | ICD-10-CM | POA: Diagnosis not present

## 2016-11-02 DIAGNOSIS — I10 Essential (primary) hypertension: Secondary | ICD-10-CM | POA: Diagnosis not present

## 2016-11-02 DIAGNOSIS — G8929 Other chronic pain: Secondary | ICD-10-CM | POA: Diagnosis not present

## 2016-11-02 DIAGNOSIS — K4090 Unilateral inguinal hernia, without obstruction or gangrene, not specified as recurrent: Secondary | ICD-10-CM | POA: Diagnosis not present

## 2016-11-02 DIAGNOSIS — M25512 Pain in left shoulder: Secondary | ICD-10-CM | POA: Diagnosis not present

## 2016-11-02 DIAGNOSIS — M1611 Unilateral primary osteoarthritis, right hip: Secondary | ICD-10-CM | POA: Diagnosis not present

## 2016-11-02 DIAGNOSIS — D649 Anemia, unspecified: Secondary | ICD-10-CM | POA: Diagnosis not present

## 2016-11-02 DIAGNOSIS — E119 Type 2 diabetes mellitus without complications: Secondary | ICD-10-CM | POA: Diagnosis not present

## 2016-11-02 DIAGNOSIS — M19011 Primary osteoarthritis, right shoulder: Secondary | ICD-10-CM | POA: Diagnosis not present

## 2016-11-02 DIAGNOSIS — C9110 Chronic lymphocytic leukemia of B-cell type not having achieved remission: Secondary | ICD-10-CM | POA: Diagnosis not present

## 2016-11-03 DIAGNOSIS — E119 Type 2 diabetes mellitus without complications: Secondary | ICD-10-CM | POA: Diagnosis not present

## 2016-11-03 DIAGNOSIS — C9110 Chronic lymphocytic leukemia of B-cell type not having achieved remission: Secondary | ICD-10-CM | POA: Diagnosis not present

## 2016-11-03 DIAGNOSIS — E441 Mild protein-calorie malnutrition: Secondary | ICD-10-CM | POA: Diagnosis not present

## 2016-11-03 DIAGNOSIS — D649 Anemia, unspecified: Secondary | ICD-10-CM | POA: Diagnosis not present

## 2016-11-03 DIAGNOSIS — M25512 Pain in left shoulder: Secondary | ICD-10-CM | POA: Diagnosis not present

## 2016-11-03 DIAGNOSIS — G8929 Other chronic pain: Secondary | ICD-10-CM | POA: Diagnosis not present

## 2016-11-03 DIAGNOSIS — M19011 Primary osteoarthritis, right shoulder: Secondary | ICD-10-CM | POA: Diagnosis not present

## 2016-11-03 DIAGNOSIS — I10 Essential (primary) hypertension: Secondary | ICD-10-CM | POA: Diagnosis not present

## 2016-11-03 DIAGNOSIS — K4090 Unilateral inguinal hernia, without obstruction or gangrene, not specified as recurrent: Secondary | ICD-10-CM | POA: Diagnosis not present

## 2016-11-03 DIAGNOSIS — M1611 Unilateral primary osteoarthritis, right hip: Secondary | ICD-10-CM | POA: Diagnosis not present

## 2016-11-04 ENCOUNTER — Telehealth: Payer: Self-pay | Admitting: Hematology and Oncology

## 2016-11-04 DIAGNOSIS — D649 Anemia, unspecified: Secondary | ICD-10-CM | POA: Diagnosis not present

## 2016-11-04 DIAGNOSIS — K4090 Unilateral inguinal hernia, without obstruction or gangrene, not specified as recurrent: Secondary | ICD-10-CM | POA: Diagnosis not present

## 2016-11-04 DIAGNOSIS — M19011 Primary osteoarthritis, right shoulder: Secondary | ICD-10-CM | POA: Diagnosis not present

## 2016-11-04 DIAGNOSIS — M1611 Unilateral primary osteoarthritis, right hip: Secondary | ICD-10-CM | POA: Diagnosis not present

## 2016-11-04 DIAGNOSIS — C9110 Chronic lymphocytic leukemia of B-cell type not having achieved remission: Secondary | ICD-10-CM | POA: Diagnosis not present

## 2016-11-04 DIAGNOSIS — I10 Essential (primary) hypertension: Secondary | ICD-10-CM | POA: Diagnosis not present

## 2016-11-04 DIAGNOSIS — G8929 Other chronic pain: Secondary | ICD-10-CM | POA: Diagnosis not present

## 2016-11-04 DIAGNOSIS — E119 Type 2 diabetes mellitus without complications: Secondary | ICD-10-CM | POA: Diagnosis not present

## 2016-11-04 DIAGNOSIS — M25512 Pain in left shoulder: Secondary | ICD-10-CM | POA: Diagnosis not present

## 2016-11-04 DIAGNOSIS — E441 Mild protein-calorie malnutrition: Secondary | ICD-10-CM | POA: Diagnosis not present

## 2016-11-04 NOTE — Telephone Encounter (Signed)
Faxed records to [facility] [phone] °

## 2016-11-17 DIAGNOSIS — I10 Essential (primary) hypertension: Secondary | ICD-10-CM | POA: Diagnosis not present

## 2016-11-17 DIAGNOSIS — C9110 Chronic lymphocytic leukemia of B-cell type not having achieved remission: Secondary | ICD-10-CM | POA: Diagnosis not present

## 2016-11-17 DIAGNOSIS — E441 Mild protein-calorie malnutrition: Secondary | ICD-10-CM | POA: Diagnosis not present

## 2016-11-17 DIAGNOSIS — M25512 Pain in left shoulder: Secondary | ICD-10-CM | POA: Diagnosis not present

## 2016-11-17 DIAGNOSIS — D649 Anemia, unspecified: Secondary | ICD-10-CM | POA: Diagnosis not present

## 2016-11-17 DIAGNOSIS — K4090 Unilateral inguinal hernia, without obstruction or gangrene, not specified as recurrent: Secondary | ICD-10-CM | POA: Diagnosis not present

## 2016-11-17 DIAGNOSIS — M19011 Primary osteoarthritis, right shoulder: Secondary | ICD-10-CM | POA: Diagnosis not present

## 2016-11-17 DIAGNOSIS — M1611 Unilateral primary osteoarthritis, right hip: Secondary | ICD-10-CM | POA: Diagnosis not present

## 2016-11-17 DIAGNOSIS — E119 Type 2 diabetes mellitus without complications: Secondary | ICD-10-CM | POA: Diagnosis not present

## 2016-11-17 DIAGNOSIS — G8929 Other chronic pain: Secondary | ICD-10-CM | POA: Diagnosis not present

## 2016-11-17 MED FILL — IMBRUVICA 140 MG CAPSULE: 140 | 30 days supply | Qty: 60 | Fill #3

## 2016-11-20 DIAGNOSIS — Z0000 Encounter for general adult medical examination without abnormal findings: Secondary | ICD-10-CM | POA: Diagnosis not present

## 2016-11-20 DIAGNOSIS — E118 Type 2 diabetes mellitus with unspecified complications: Secondary | ICD-10-CM | POA: Diagnosis not present

## 2016-11-23 DIAGNOSIS — E441 Mild protein-calorie malnutrition: Secondary | ICD-10-CM | POA: Diagnosis not present

## 2016-11-23 DIAGNOSIS — M1611 Unilateral primary osteoarthritis, right hip: Secondary | ICD-10-CM | POA: Diagnosis not present

## 2016-11-23 DIAGNOSIS — K4090 Unilateral inguinal hernia, without obstruction or gangrene, not specified as recurrent: Secondary | ICD-10-CM | POA: Diagnosis not present

## 2016-11-23 DIAGNOSIS — M19011 Primary osteoarthritis, right shoulder: Secondary | ICD-10-CM | POA: Diagnosis not present

## 2016-11-23 DIAGNOSIS — I10 Essential (primary) hypertension: Secondary | ICD-10-CM | POA: Diagnosis not present

## 2016-11-23 DIAGNOSIS — D649 Anemia, unspecified: Secondary | ICD-10-CM | POA: Diagnosis not present

## 2016-11-23 DIAGNOSIS — G8929 Other chronic pain: Secondary | ICD-10-CM | POA: Diagnosis not present

## 2016-11-23 DIAGNOSIS — C9110 Chronic lymphocytic leukemia of B-cell type not having achieved remission: Secondary | ICD-10-CM | POA: Diagnosis not present

## 2016-11-23 DIAGNOSIS — M25512 Pain in left shoulder: Secondary | ICD-10-CM | POA: Diagnosis not present

## 2016-11-23 DIAGNOSIS — E119 Type 2 diabetes mellitus without complications: Secondary | ICD-10-CM | POA: Diagnosis not present

## 2016-11-25 DIAGNOSIS — G8929 Other chronic pain: Secondary | ICD-10-CM | POA: Diagnosis not present

## 2016-11-25 DIAGNOSIS — M1611 Unilateral primary osteoarthritis, right hip: Secondary | ICD-10-CM | POA: Diagnosis not present

## 2016-11-25 DIAGNOSIS — M19011 Primary osteoarthritis, right shoulder: Secondary | ICD-10-CM | POA: Diagnosis not present

## 2016-11-25 DIAGNOSIS — E119 Type 2 diabetes mellitus without complications: Secondary | ICD-10-CM | POA: Diagnosis not present

## 2016-11-25 DIAGNOSIS — D649 Anemia, unspecified: Secondary | ICD-10-CM | POA: Diagnosis not present

## 2016-11-25 DIAGNOSIS — M25512 Pain in left shoulder: Secondary | ICD-10-CM | POA: Diagnosis not present

## 2016-11-25 DIAGNOSIS — E441 Mild protein-calorie malnutrition: Secondary | ICD-10-CM | POA: Diagnosis not present

## 2016-11-25 DIAGNOSIS — I10 Essential (primary) hypertension: Secondary | ICD-10-CM | POA: Diagnosis not present

## 2016-11-25 DIAGNOSIS — C9110 Chronic lymphocytic leukemia of B-cell type not having achieved remission: Secondary | ICD-10-CM | POA: Diagnosis not present

## 2016-11-25 DIAGNOSIS — K4090 Unilateral inguinal hernia, without obstruction or gangrene, not specified as recurrent: Secondary | ICD-10-CM | POA: Diagnosis not present

## 2016-11-26 DIAGNOSIS — C9110 Chronic lymphocytic leukemia of B-cell type not having achieved remission: Secondary | ICD-10-CM | POA: Diagnosis not present

## 2016-11-26 DIAGNOSIS — E441 Mild protein-calorie malnutrition: Secondary | ICD-10-CM | POA: Diagnosis not present

## 2016-11-26 DIAGNOSIS — I10 Essential (primary) hypertension: Secondary | ICD-10-CM | POA: Diagnosis not present

## 2016-11-26 DIAGNOSIS — K4090 Unilateral inguinal hernia, without obstruction or gangrene, not specified as recurrent: Secondary | ICD-10-CM | POA: Diagnosis not present

## 2016-11-26 DIAGNOSIS — M25512 Pain in left shoulder: Secondary | ICD-10-CM | POA: Diagnosis not present

## 2016-11-26 DIAGNOSIS — M1611 Unilateral primary osteoarthritis, right hip: Secondary | ICD-10-CM | POA: Diagnosis not present

## 2016-11-26 DIAGNOSIS — M19011 Primary osteoarthritis, right shoulder: Secondary | ICD-10-CM | POA: Diagnosis not present

## 2016-11-26 DIAGNOSIS — E119 Type 2 diabetes mellitus without complications: Secondary | ICD-10-CM | POA: Diagnosis not present

## 2016-11-26 DIAGNOSIS — G8929 Other chronic pain: Secondary | ICD-10-CM | POA: Diagnosis not present

## 2016-11-26 DIAGNOSIS — D649 Anemia, unspecified: Secondary | ICD-10-CM | POA: Diagnosis not present

## 2016-11-30 DIAGNOSIS — D649 Anemia, unspecified: Secondary | ICD-10-CM | POA: Diagnosis not present

## 2016-11-30 DIAGNOSIS — M1611 Unilateral primary osteoarthritis, right hip: Secondary | ICD-10-CM | POA: Diagnosis not present

## 2016-11-30 DIAGNOSIS — E441 Mild protein-calorie malnutrition: Secondary | ICD-10-CM | POA: Diagnosis not present

## 2016-11-30 DIAGNOSIS — C9110 Chronic lymphocytic leukemia of B-cell type not having achieved remission: Secondary | ICD-10-CM | POA: Diagnosis not present

## 2016-11-30 DIAGNOSIS — M19011 Primary osteoarthritis, right shoulder: Secondary | ICD-10-CM | POA: Diagnosis not present

## 2016-11-30 DIAGNOSIS — K4090 Unilateral inguinal hernia, without obstruction or gangrene, not specified as recurrent: Secondary | ICD-10-CM | POA: Diagnosis not present

## 2016-11-30 DIAGNOSIS — I10 Essential (primary) hypertension: Secondary | ICD-10-CM | POA: Diagnosis not present

## 2016-11-30 DIAGNOSIS — G8929 Other chronic pain: Secondary | ICD-10-CM | POA: Diagnosis not present

## 2016-11-30 DIAGNOSIS — E119 Type 2 diabetes mellitus without complications: Secondary | ICD-10-CM | POA: Diagnosis not present

## 2016-11-30 DIAGNOSIS — M25512 Pain in left shoulder: Secondary | ICD-10-CM | POA: Diagnosis not present

## 2016-12-01 DIAGNOSIS — M19011 Primary osteoarthritis, right shoulder: Secondary | ICD-10-CM | POA: Diagnosis not present

## 2016-12-01 DIAGNOSIS — E119 Type 2 diabetes mellitus without complications: Secondary | ICD-10-CM | POA: Diagnosis not present

## 2016-12-01 DIAGNOSIS — M25512 Pain in left shoulder: Secondary | ICD-10-CM | POA: Diagnosis not present

## 2016-12-01 DIAGNOSIS — K4090 Unilateral inguinal hernia, without obstruction or gangrene, not specified as recurrent: Secondary | ICD-10-CM | POA: Diagnosis not present

## 2016-12-01 DIAGNOSIS — M1611 Unilateral primary osteoarthritis, right hip: Secondary | ICD-10-CM | POA: Diagnosis not present

## 2016-12-01 DIAGNOSIS — C9110 Chronic lymphocytic leukemia of B-cell type not having achieved remission: Secondary | ICD-10-CM | POA: Diagnosis not present

## 2016-12-01 DIAGNOSIS — D649 Anemia, unspecified: Secondary | ICD-10-CM | POA: Diagnosis not present

## 2016-12-01 DIAGNOSIS — G8929 Other chronic pain: Secondary | ICD-10-CM | POA: Diagnosis not present

## 2016-12-01 DIAGNOSIS — E441 Mild protein-calorie malnutrition: Secondary | ICD-10-CM | POA: Diagnosis not present

## 2016-12-01 DIAGNOSIS — I10 Essential (primary) hypertension: Secondary | ICD-10-CM | POA: Diagnosis not present

## 2016-12-02 DIAGNOSIS — M1611 Unilateral primary osteoarthritis, right hip: Secondary | ICD-10-CM | POA: Diagnosis not present

## 2016-12-02 DIAGNOSIS — G8929 Other chronic pain: Secondary | ICD-10-CM | POA: Diagnosis not present

## 2016-12-02 DIAGNOSIS — K4090 Unilateral inguinal hernia, without obstruction or gangrene, not specified as recurrent: Secondary | ICD-10-CM | POA: Diagnosis not present

## 2016-12-02 DIAGNOSIS — M5416 Radiculopathy, lumbar region: Secondary | ICD-10-CM | POA: Diagnosis not present

## 2016-12-02 DIAGNOSIS — M19011 Primary osteoarthritis, right shoulder: Secondary | ICD-10-CM | POA: Diagnosis not present

## 2016-12-02 DIAGNOSIS — E441 Mild protein-calorie malnutrition: Secondary | ICD-10-CM | POA: Diagnosis not present

## 2016-12-02 DIAGNOSIS — I10 Essential (primary) hypertension: Secondary | ICD-10-CM | POA: Diagnosis not present

## 2016-12-02 DIAGNOSIS — E119 Type 2 diabetes mellitus without complications: Secondary | ICD-10-CM | POA: Diagnosis not present

## 2016-12-02 DIAGNOSIS — D649 Anemia, unspecified: Secondary | ICD-10-CM | POA: Diagnosis not present

## 2016-12-02 DIAGNOSIS — C9110 Chronic lymphocytic leukemia of B-cell type not having achieved remission: Secondary | ICD-10-CM | POA: Diagnosis not present

## 2016-12-02 DIAGNOSIS — G894 Chronic pain syndrome: Secondary | ICD-10-CM | POA: Diagnosis not present

## 2016-12-02 DIAGNOSIS — M961 Postlaminectomy syndrome, not elsewhere classified: Secondary | ICD-10-CM | POA: Diagnosis not present

## 2016-12-02 DIAGNOSIS — M25512 Pain in left shoulder: Secondary | ICD-10-CM | POA: Diagnosis not present

## 2016-12-07 DIAGNOSIS — E119 Type 2 diabetes mellitus without complications: Secondary | ICD-10-CM | POA: Diagnosis not present

## 2016-12-07 DIAGNOSIS — G8929 Other chronic pain: Secondary | ICD-10-CM | POA: Diagnosis not present

## 2016-12-07 DIAGNOSIS — M1611 Unilateral primary osteoarthritis, right hip: Secondary | ICD-10-CM | POA: Diagnosis not present

## 2016-12-07 DIAGNOSIS — E441 Mild protein-calorie malnutrition: Secondary | ICD-10-CM | POA: Diagnosis not present

## 2016-12-07 DIAGNOSIS — I10 Essential (primary) hypertension: Secondary | ICD-10-CM | POA: Diagnosis not present

## 2016-12-07 DIAGNOSIS — K4090 Unilateral inguinal hernia, without obstruction or gangrene, not specified as recurrent: Secondary | ICD-10-CM | POA: Diagnosis not present

## 2016-12-07 DIAGNOSIS — D649 Anemia, unspecified: Secondary | ICD-10-CM | POA: Diagnosis not present

## 2016-12-07 DIAGNOSIS — M25512 Pain in left shoulder: Secondary | ICD-10-CM | POA: Diagnosis not present

## 2016-12-07 DIAGNOSIS — C9110 Chronic lymphocytic leukemia of B-cell type not having achieved remission: Secondary | ICD-10-CM | POA: Diagnosis not present

## 2016-12-07 DIAGNOSIS — M19011 Primary osteoarthritis, right shoulder: Secondary | ICD-10-CM | POA: Diagnosis not present

## 2016-12-11 DIAGNOSIS — C9110 Chronic lymphocytic leukemia of B-cell type not having achieved remission: Secondary | ICD-10-CM | POA: Diagnosis not present

## 2016-12-11 DIAGNOSIS — E119 Type 2 diabetes mellitus without complications: Secondary | ICD-10-CM | POA: Diagnosis not present

## 2016-12-11 DIAGNOSIS — M25512 Pain in left shoulder: Secondary | ICD-10-CM | POA: Diagnosis not present

## 2016-12-11 DIAGNOSIS — K4090 Unilateral inguinal hernia, without obstruction or gangrene, not specified as recurrent: Secondary | ICD-10-CM | POA: Diagnosis not present

## 2016-12-11 DIAGNOSIS — E441 Mild protein-calorie malnutrition: Secondary | ICD-10-CM | POA: Diagnosis not present

## 2016-12-11 DIAGNOSIS — M1611 Unilateral primary osteoarthritis, right hip: Secondary | ICD-10-CM | POA: Diagnosis not present

## 2016-12-11 DIAGNOSIS — I10 Essential (primary) hypertension: Secondary | ICD-10-CM | POA: Diagnosis not present

## 2016-12-11 DIAGNOSIS — M19011 Primary osteoarthritis, right shoulder: Secondary | ICD-10-CM | POA: Diagnosis not present

## 2016-12-11 DIAGNOSIS — D649 Anemia, unspecified: Secondary | ICD-10-CM | POA: Diagnosis not present

## 2016-12-11 DIAGNOSIS — G8929 Other chronic pain: Secondary | ICD-10-CM | POA: Diagnosis not present

## 2016-12-14 DIAGNOSIS — K4090 Unilateral inguinal hernia, without obstruction or gangrene, not specified as recurrent: Secondary | ICD-10-CM | POA: Diagnosis not present

## 2016-12-14 DIAGNOSIS — G8929 Other chronic pain: Secondary | ICD-10-CM | POA: Diagnosis not present

## 2016-12-14 DIAGNOSIS — D649 Anemia, unspecified: Secondary | ICD-10-CM | POA: Diagnosis not present

## 2016-12-14 DIAGNOSIS — C9110 Chronic lymphocytic leukemia of B-cell type not having achieved remission: Secondary | ICD-10-CM | POA: Diagnosis not present

## 2016-12-14 DIAGNOSIS — E441 Mild protein-calorie malnutrition: Secondary | ICD-10-CM | POA: Diagnosis not present

## 2016-12-14 DIAGNOSIS — M19011 Primary osteoarthritis, right shoulder: Secondary | ICD-10-CM | POA: Diagnosis not present

## 2016-12-14 DIAGNOSIS — M1611 Unilateral primary osteoarthritis, right hip: Secondary | ICD-10-CM | POA: Diagnosis not present

## 2016-12-14 DIAGNOSIS — I10 Essential (primary) hypertension: Secondary | ICD-10-CM | POA: Diagnosis not present

## 2016-12-14 DIAGNOSIS — M25512 Pain in left shoulder: Secondary | ICD-10-CM | POA: Diagnosis not present

## 2016-12-14 DIAGNOSIS — E119 Type 2 diabetes mellitus without complications: Secondary | ICD-10-CM | POA: Diagnosis not present

## 2016-12-18 DIAGNOSIS — K4090 Unilateral inguinal hernia, without obstruction or gangrene, not specified as recurrent: Secondary | ICD-10-CM | POA: Diagnosis not present

## 2016-12-18 DIAGNOSIS — I10 Essential (primary) hypertension: Secondary | ICD-10-CM | POA: Diagnosis not present

## 2016-12-18 DIAGNOSIS — M25512 Pain in left shoulder: Secondary | ICD-10-CM | POA: Diagnosis not present

## 2016-12-18 DIAGNOSIS — C9110 Chronic lymphocytic leukemia of B-cell type not having achieved remission: Secondary | ICD-10-CM | POA: Diagnosis not present

## 2016-12-18 DIAGNOSIS — M1611 Unilateral primary osteoarthritis, right hip: Secondary | ICD-10-CM | POA: Diagnosis not present

## 2016-12-18 DIAGNOSIS — M19011 Primary osteoarthritis, right shoulder: Secondary | ICD-10-CM | POA: Diagnosis not present

## 2016-12-18 DIAGNOSIS — D649 Anemia, unspecified: Secondary | ICD-10-CM | POA: Diagnosis not present

## 2016-12-18 DIAGNOSIS — E119 Type 2 diabetes mellitus without complications: Secondary | ICD-10-CM | POA: Diagnosis not present

## 2016-12-18 DIAGNOSIS — E441 Mild protein-calorie malnutrition: Secondary | ICD-10-CM | POA: Diagnosis not present

## 2016-12-18 DIAGNOSIS — G8929 Other chronic pain: Secondary | ICD-10-CM | POA: Diagnosis not present

## 2016-12-21 MED FILL — IMBRUVICA 140 MG CAPSULE: 140 | 30 days supply | Qty: 60 | Fill #4

## 2016-12-23 DIAGNOSIS — R69 Illness, unspecified: Secondary | ICD-10-CM | POA: Diagnosis not present

## 2016-12-28 DIAGNOSIS — I340 Nonrheumatic mitral (valve) insufficiency: Secondary | ICD-10-CM | POA: Diagnosis not present

## 2016-12-28 DIAGNOSIS — R55 Syncope and collapse: Secondary | ICD-10-CM | POA: Diagnosis not present

## 2016-12-28 DIAGNOSIS — I472 Ventricular tachycardia: Secondary | ICD-10-CM | POA: Diagnosis not present

## 2017-01-13 ENCOUNTER — Telehealth: Payer: Self-pay | Admitting: Pharmacy Technician

## 2017-01-13 NOTE — Telephone Encounter (Signed)
Oral Oncology Patient Advocate Encounter  Notified by [place] that the patient's existing copay grant from [employer] was almost depleted of funds.  Was successful in securing patient an $ 7600 grant from Patient [employer] [facility]) to provide continued copayment coverage for his Imbruvica.  This will keep the out of pocket expense at $0.    I have spoken with the patient.    The billing information is as follows and has been shared with [place].   Member ID: [id number]  Group ID: [id number] RxBin: [id number] Dates of Eligibility: 10/15/2016 through 10/14/2017  Fabio Asa. Melynda Keller, [place] [facility] [phone] 01/13/2017 3:05 PM

## 2017-01-20 NOTE — Progress Notes (Signed)
[facility]  PATIENT: Michael [name] DOB: October 30, 1930   REASON FOR VISIT: Follow-up for intermittent right-sided weakness and numbness,  HISTORY FROM: Patient alone at visit    HISTORY OF PRESENT ILLNESS:Michael Dilworthis an 83 y.o.maleAfrican-American, right-handed with past medical history significant for CLL, diabetes who presents with three-day history of intermittent right-sided weakness, numbness.  The patient states that when he woke up on around 8:00 am on 10/19/2016 and noticed that he could not get up out of bed because he was unable to move and feel his rightarm and leg.  His symptoms improved gradually over the next few hours and by 11 AM he felt that most of his strength had returned.  However, he still felt numb on the right face arm and leg. He did not call 911 but eventually presented to [facility] ER. He also states that his symptoms started on Friday morning however resolved and occurred again on Sunday morning. He also complains of Intermittent dizziness as well as chest pain. He takes aspirin daily. Denies any other symptoms such as slurred speech, difficulty getting words out, lack of balance, visual changes. He also denies shooting pain from his neck down to his arms or legs. CT head was obtained in the emergency room which was negative for an acute stroke. He was diagnosed with CLL 2 years ago, and currently taking ibrutinib.  Date last known well: 9.21..18 Time last known well: Morning NIHSS 1 MRS 1 Patient was not administered IV t-PA secondary to arriving outside of the tPA treatment window. He was admitted to General neurology for further evaluation and treatment.   SUBJECTIVE (INTERVAL HISTORY) No family is at the bedside.  The patient is awake, alert, and follows all commands appropriately. He stated that he had right arm and leg weakness since 2 months ago, especially on waking up in the morning, he felt morning stiffness with  decreased movement of right shoulder and right hip with achy pain at right shoulder and right hip. As the days go by, right-sided weakness and achy pain improve, so that at night he is near at baseline. However the second day, it is all over again. For the last 1 week, he felt weaker and more achy pain in the morning when he woke up. He had chronic right shoulder achy pain and morning stiffness for long time, left lower extremity never had such problem. On exam, he had subjective right-sided decreased sensation, 90% to the left, and giveaway weakness on the right arm and leg. Interval history December 27, 2018CM [name], 83 year old male returns for follow-up with history of for right-sided weakness and numbness.  He also has a history of CLL.  He had no slurred speech problems with balance or visual changes.  CT negative for acute stroke MRI no acute stroke.  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  Echo 60-65% EF.  LDL 34 hemoglobin A1c 6.2.  MRI of the cervical spine without acute abnormality or abnormal cord signal.  Patient does have some right hip and shoulder stiffness.  He remains on aspirin for secondary stroke prevention.  Blood pressure in the office today 125/64.  He does not wish to have further workup for his paresthesias at this point.  He reports that his wife died about 3 months ago and that has been an adjustment.  He denies any falls.  He continues to drive without difficulty.  He returns for reevaluation  REVIEW OF SYSTEMS: Full 14 system  review of systems performed and notable only for those listed, all others are neg:  Constitutional: neg  Cardiovascular: Leg swelling Ear/Nose/Throat: neg  Skin: neg Eyes: neg Respiratory: neg Gastroitestinal: neg  Hematology/Lymphatic: neg  Endocrine: neg Musculoskeletal:neg Allergy/Immunology: neg Neurological: neg Psychiatric: neg Sleep : neg   ALLERGIES: No Known Allergies  HOME MEDICATIONS: Outpatient  Medications Prior to Visit  Medication Sig Dispense Refill  . acetaminophen (TYLENOL) 325 MG tablet Take 2 tablets (650 mg total) by mouth every 4 (four) hours as needed for mild pain (or temp > 37.5 C (99.5 F)). 30 tablet 0  . aspirin 325 MG tablet Take 325 mg by mouth daily.      Marland Kitchen ibrutinib (IMBRUVICA) 140 MG capsul Take 2 capsules (280 mg total) by mouth daily. 60 capsule 6  . insulin NPH-insulin regular (NOVOLIN 70/30) (70-30) 100 UNIT/ML injection Inject 15-20 Units into the skin 2 (two) times daily as needed (CBG >100). Sliding scale    . metFORMIN (GLUCOPHAGE) 1000 MG tablet Take 1,000 mg by mouth 2 (two) times daily with a meal.     . morphine (MSIR) 15 MG tablet Take 15 mg by mouth every 6 (six) hours as needed for severe pain.   0  . Multiple Vitamin (MULTIVITAMIN WITH MINERALS) TABS tablet Take 1 tablet by mouth daily.    . pantoprazole (PROTONIX) 40 MG tablet Take 1 tablet (40 mg total) by mouth daily. 30 tablet 11  . ramipril (ALTACE) 5 MG capsule Take 5 mg by mouth daily.  0  . Tamsulosin HCl (FLOMAX) 0.4 MG CAPS Take 0.4 mg by mouth daily after supper.     . torsemide (DEMADEX) 20 MG tablet Take 20 mg by mouth daily after supper.      No facility-administered medications prior to visit.     PAST MEDICAL HISTORY: Past Medical History:  Diagnosis Date  . Arthritis    "left shoulder, lower back" (10/20/2016)  . CLL (chronic lymphoblastic leukemia) dx'd 2000  . Family history of adverse reaction to anesthesia    "daughter's BP drops"   . GERD (gastroesophageal reflux disease)   . History of hiatal hernia   . 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 Lymphocytosis   . Type II diabetes mellitus ([place])     PAST SURGICAL HISTORY: Past Surgical History:  Procedure Laterality Date  . CATARACT EXTRACTION W/ INTRAOCULAR LENS  IMPLANT, BILATERAL Bilateral     FAMILY HISTORY: Family History  Problem Relation Age of Onset  . Cancer Mother   . Hypertension Father   .  Diabetes Father     SOCIAL HISTORY: Social History   Socioeconomic History  . Marital status: Married    Spouse name: Not on file  . Number of children: Not on file  . Years of education: Not on file  . Highest education level: Not on file  Social Needs  . Financial resource strain: Not on file  . Food insecurity - worry: Not on file  . Food insecurity - inability: Not on file  . Transportation needs - medical: Not on file  . Transportation needs - non-medical: Not on file  Occupational History  . Not on file  Tobacco Use  . Smoking status: Never Smoker  . Smokeless tobacco: Former Systems developer    Types: Snuff  Substance and Sexual Activity  . Alcohol use: No  . Drug use: No  . Sexual activity: Not on file  Other Topics Concern  . Not on file  Social History  Narrative  . Not on file     PHYSICAL EXAM  Vitals:   01/21/17 1401  BP: 125/64  Pulse: 61  Weight: 196 lb 6.4 oz (89.1 kg)  Height: 5\' 7"  (1.702 m)   Body mass index is 30.76 kg/m.  Generalized: Well developed, in no acute distress  Head: normocephalic and atraumatic,. Oropharynx benign  Neck: Supple, no carotid bruits  Cardiac: Regular rate rhythm, no murmur  Musculoskeletal: No deformity  Skin 1-2+ edema both lower extremities  Neurological examination   Mentation: Alert oriented to time, place, history taking. Attention span and concentration appropriate. Recent and remote memory intact.  Follows all commands speech and language fluent.   Cranial nerve II-XII: .Pupils were equal round reactive to light extraocular movements were full, visual field were full on confrontational test. Facial sensation and strength were normal. hearing was intact to finger rubbing bilaterally. Uvula tongue midline. head turning and shoulder shrug were normal and symmetric.Tongue protrusion into cheek strength was normal. Motor: normal bulk and tone, full strength in the BUE, BLE, except mild way weakness right upper  extremity Sensory: normal and symmetric to light touch, pinprick, and  Vibration, in the upper and lower extremities Coordination: finger-nose-finger, heel-to-shin bilaterally, no dysmetria Reflexes: 1+ upper and lower and symmetric, plantar responses were flexor bilaterally. Gait and Station: Rising up from seated position without assistance, normal stance,  moderate stride, good arm swing, smooth turning, able to perform tiptoe, and heel walking without difficulty. Tandem gait is unsteady.  Romberg negative.  No assistive device  DIAGNOSTIC DATA (LABS, IMAGING, TESTING) - I reviewed patient records, labs, notes, testing and imaging myself where available.  Lab Results  Component Value Date   WBC 5.4 10/21/2016   HGB 10.1 (L) 10/21/2016   HCT 32.0 (L) 10/21/2016   MCV 84.2 10/21/2016   PLT 179 10/21/2016      Component Value Date/Time   NA 139 10/21/2016 0812   NA 140 10/09/2016 1106   K 3.7 10/21/2016 0812   K 4.7 10/09/2016 1106   CL 106 10/21/2016 0812   CL 104 03/28/2012 1426   CO2 27 10/21/2016 0812   CO2 28 10/09/2016 1106   GLUCOSE 163 (H) 10/21/2016 0812   GLUCOSE 127 10/09/2016 1106   GLUCOSE 171 (H) 03/28/2012 1426   BUN 10 10/21/2016 0812   BUN 20.8 10/09/2016 1106   CREATININE 0.90 10/21/2016 0812   CREATININE 1.1 10/09/2016 1106   CALCIUM 8.3 (L) 10/21/2016 0812   CALCIUM 9.1 10/09/2016 1106   PROT 4.8 (L) 10/21/2016 0812   PROT 5.8 (L) 10/09/2016 1106   ALBUMIN 2.7 (L) 10/21/2016 0812   ALBUMIN 3.1 (L) 10/09/2016 1106   AST 17 10/21/2016 0812   AST 17 10/09/2016 1106   ALT 10 (L) 10/21/2016 0812   ALT 8 10/09/2016 1106   ALKPHOS 32 (L) 10/21/2016 0812   ALKPHOS 46 10/09/2016 1106   BILITOT 0.4 10/21/2016 0812   BILITOT 0.23 10/09/2016 1106   GFRNONAA >60 10/21/2016 0812   GFRAA >60 10/21/2016 0812   Lab Results  Component Value Date   CHOL 95 10/20/2016   HDL 52 10/20/2016   LDLCALC 34 10/20/2016   TRIG 44 10/20/2016   CHOLHDL 1.8 10/20/2016    Lab Results  Component Value Date   HGBA1C 6.2 (H) 10/20/2016   ASSESSMENT AND PLAN  83 y.o. 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 here for hospital follow-up for intermittent right sided numbness and weakness which is intermittent.CT negative for acute stroke MRI no acute stroke.  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  Echo 60-65% EF.  LDL 34 hemoglobin A1c 6.2.  MRI of the cervical spine without acute abnormality or abnormal cord signal. The patient is a current patient of Dr. Erlinda Hong who is out of the office today . This note is sent to the work in doctor.      Stressed the importance of management of risk factors to prevent further stroke like symptoms Continue aspirin for secondary stroke prevention Maintain strict control of hypertension with blood pressure goal below 130/90, today's reading 125/64 continue antihypertensive medications Control of diabetes with hemoglobin A1c below 6.5 followed by primary care most recent hemoglobin A1c6.2 continue diabetic medications Metformin Cholesterol with LDL cholesterol less than 70, followed by primary care,  most recent 34 Exercise by walking, 30 minutes daily with cane if outside eat healthy diet with whole grains,  fresh fruits and vegetables Patient does not wish any further workup for his intermittent paresthesias at this time F/U 6 months Discussed risk for recurrent stroke/ TIA and answered additional questions This was a visit requiring 25 minutes and medical decision making of high complexity with extensive review of history, hospital chart, counseling and answering questions Dennie Bible, [facility], [facility], APRN  [facility] [address] [place], [place] [address] [phone]

## 2017-01-21 ENCOUNTER — Encounter: Payer: Self-pay | Admitting: Nurse Practitioner

## 2017-01-21 ENCOUNTER — Ambulatory Visit: Payer: Medicare HMO | Admitting: Nurse Practitioner

## 2017-01-21 VITALS — BP 125/64 | HR 61 | Ht 67.0 in | Wt 196.4 lb

## 2017-01-21 DIAGNOSIS — I10 Essential (primary) hypertension: Secondary | ICD-10-CM

## 2017-01-21 DIAGNOSIS — R600 Localized edema: Secondary | ICD-10-CM

## 2017-01-21 DIAGNOSIS — R202 Paresthesia of skin: Secondary | ICD-10-CM

## 2017-01-21 DIAGNOSIS — C9190 Lymphoid leukemia, unspecified not having achieved remission: Secondary | ICD-10-CM

## 2017-01-21 DIAGNOSIS — C9110 Chronic lymphocytic leukemia of B-cell type not having achieved remission: Secondary | ICD-10-CM

## 2017-01-21 MED FILL — IMBRUVICA 140 MG CAPSULE: 140 | 30 days supply | Qty: 60 | Fill #5

## 2017-01-21 NOTE — Patient Instructions (Signed)
Stressed the importance of management of risk factors to prevent further stroke like symptoms Continue aspirin for secondary stroke prevention Maintain strict control of hypertension with blood pressure goal below 130/90, today's reading 125/64 continue antihypertensive medications Control of diabetes with hemoglobin A1c below 6.5 followed by primary care most recent hemoglobin A1c6.2 continue diabetic medications Metformin Cholesterol with LDL cholesterol less than 70, followed by primary care,  most recent 34 Exercise by walking, s30 minutes daily with cane if outside eat healthy diet with whole grains,  fresh fruits and vegetables F/U 6 months

## 2017-01-22 NOTE — Progress Notes (Signed)
I agree with the assessment and plan as directed by NP .The patient is not known to me .   Anaisha Mago, MD

## 2017-01-25 DIAGNOSIS — E118 Type 2 diabetes mellitus with unspecified complications: Secondary | ICD-10-CM | POA: Diagnosis not present

## 2017-02-17 DIAGNOSIS — R69 Illness, unspecified: Secondary | ICD-10-CM | POA: Diagnosis not present

## 2017-02-18 DIAGNOSIS — E7800 Pure hypercholesterolemia, unspecified: Secondary | ICD-10-CM | POA: Diagnosis not present

## 2017-02-18 DIAGNOSIS — N390 Urinary tract infection, site not specified: Secondary | ICD-10-CM | POA: Diagnosis not present

## 2017-02-18 DIAGNOSIS — Z125 Encounter for screening for malignant neoplasm of prostate: Secondary | ICD-10-CM | POA: Diagnosis not present

## 2017-02-18 DIAGNOSIS — E118 Type 2 diabetes mellitus with unspecified complications: Secondary | ICD-10-CM | POA: Diagnosis not present

## 2017-02-18 DIAGNOSIS — Z0000 Encounter for general adult medical examination without abnormal findings: Secondary | ICD-10-CM | POA: Diagnosis not present

## 2017-02-22 MED FILL — IMBRUVICA 140 MG CAPSULE: 140 | 30 days supply | Qty: 60 | Fill #6

## 2017-02-25 DIAGNOSIS — Z0000 Encounter for general adult medical examination without abnormal findings: Secondary | ICD-10-CM | POA: Diagnosis not present

## 2017-02-25 DIAGNOSIS — D649 Anemia, unspecified: Secondary | ICD-10-CM | POA: Diagnosis not present

## 2017-02-25 DIAGNOSIS — E118 Type 2 diabetes mellitus with unspecified complications: Secondary | ICD-10-CM | POA: Diagnosis not present

## 2017-03-03 DIAGNOSIS — Z79891 Long term (current) use of opiate analgesic: Secondary | ICD-10-CM | POA: Diagnosis not present

## 2017-03-03 DIAGNOSIS — G894 Chronic pain syndrome: Secondary | ICD-10-CM | POA: Diagnosis not present

## 2017-03-03 DIAGNOSIS — M545 Low back pain: Secondary | ICD-10-CM | POA: Diagnosis not present

## 2017-03-15 DIAGNOSIS — I10 Essential (primary) hypertension: Secondary | ICD-10-CM | POA: Diagnosis not present

## 2017-03-15 DIAGNOSIS — J309 Allergic rhinitis, unspecified: Secondary | ICD-10-CM | POA: Diagnosis not present

## 2017-03-15 DIAGNOSIS — E1140 Type 2 diabetes mellitus with diabetic neuropathy, unspecified: Secondary | ICD-10-CM | POA: Diagnosis not present

## 2017-03-15 DIAGNOSIS — H547 Unspecified visual loss: Secondary | ICD-10-CM | POA: Diagnosis not present

## 2017-03-15 DIAGNOSIS — K08409 Partial loss of teeth, unspecified cause, unspecified class: Secondary | ICD-10-CM | POA: Diagnosis not present

## 2017-03-15 DIAGNOSIS — C9590 Leukemia, unspecified not having achieved remission: Secondary | ICD-10-CM | POA: Diagnosis not present

## 2017-03-15 DIAGNOSIS — Z794 Long term (current) use of insulin: Secondary | ICD-10-CM | POA: Diagnosis not present

## 2017-03-15 DIAGNOSIS — E1143 Type 2 diabetes mellitus with diabetic autonomic (poly)neuropathy: Secondary | ICD-10-CM | POA: Diagnosis not present

## 2017-03-15 DIAGNOSIS — G8929 Other chronic pain: Secondary | ICD-10-CM | POA: Diagnosis not present

## 2017-03-15 DIAGNOSIS — R69 Illness, unspecified: Secondary | ICD-10-CM | POA: Diagnosis not present

## 2017-03-17 ENCOUNTER — Other Ambulatory Visit: Payer: Self-pay | Admitting: Hematology and Oncology

## 2017-03-23 MED FILL — IMBRUVICA 140 MG CAPSULE: 140 | 30 days supply | Qty: 60 | Fill #0

## 2017-04-09 ENCOUNTER — Other Ambulatory Visit: Payer: Self-pay | Admitting: Hematology and Oncology

## 2017-04-09 DIAGNOSIS — R69 Illness, unspecified: Secondary | ICD-10-CM | POA: Diagnosis not present

## 2017-04-09 DIAGNOSIS — C9110 Chronic lymphocytic leukemia of B-cell type not having achieved remission: Secondary | ICD-10-CM

## 2017-04-10 DIAGNOSIS — E119 Type 2 diabetes mellitus without complications: Secondary | ICD-10-CM | POA: Diagnosis not present

## 2017-04-12 ENCOUNTER — Encounter: Payer: Self-pay | Admitting: Hematology and Oncology

## 2017-04-12 ENCOUNTER — Inpatient Hospital Stay: Payer: Medicare HMO | Attending: Hematology and Oncology | Admitting: Hematology and Oncology

## 2017-04-12 ENCOUNTER — Inpatient Hospital Stay: Payer: Medicare HMO

## 2017-04-12 VITALS — BP 144/63 | HR 73 | Temp 97.8°F | Resp 18 | Ht 67.0 in | Wt 183.8 lb

## 2017-04-12 DIAGNOSIS — C9110 Chronic lymphocytic leukemia of B-cell type not having achieved remission: Secondary | ICD-10-CM

## 2017-04-12 DIAGNOSIS — R634 Abnormal weight loss: Secondary | ICD-10-CM | POA: Diagnosis not present

## 2017-04-12 DIAGNOSIS — Z794 Long term (current) use of insulin: Secondary | ICD-10-CM | POA: Insufficient documentation

## 2017-04-12 DIAGNOSIS — C9190 Lymphoid leukemia, unspecified not having achieved remission: Secondary | ICD-10-CM

## 2017-04-12 DIAGNOSIS — R600 Localized edema: Secondary | ICD-10-CM | POA: Diagnosis not present

## 2017-04-12 DIAGNOSIS — D630 Anemia in neoplastic disease: Secondary | ICD-10-CM | POA: Diagnosis not present

## 2017-04-12 DIAGNOSIS — Z7982 Long term (current) use of aspirin: Secondary | ICD-10-CM | POA: Insufficient documentation

## 2017-04-12 DIAGNOSIS — Z79899 Other long term (current) drug therapy: Secondary | ICD-10-CM | POA: Diagnosis not present

## 2017-04-12 LAB — CBC WITH DIFFERENTIAL/PLATELET
Basophils Absolute: 0 10*3/uL (ref 0.0–0.1)
Basophils Relative: 0 %
EOS ABS: 0.1 10*3/uL (ref 0.0–0.5)
Eosinophils Relative: 1 %
HEMATOCRIT: 34.9 % — AB (ref 38.4–49.9)
HEMOGLOBIN: 11.1 g/dL — AB (ref 13.0–17.1)
LYMPHS ABS: 3.5 10*3/uL — AB (ref 0.9–3.3)
LYMPHS PCT: 41 %
MCH: 26.4 pg — AB (ref 27.2–33.4)
MCHC: 31.8 g/dL — ABNORMAL LOW (ref 32.0–36.0)
MCV: 82.9 fL (ref 79.3–98.0)
Monocytes Absolute: 0.4 10*3/uL (ref 0.1–0.9)
Monocytes Relative: 4 %
NEUTROS ABS: 4.6 10*3/uL (ref 1.5–6.5)
NEUTROS PCT: 54 %
Platelets: 270 10*3/uL (ref 140–400)
RBC: 4.21 MIL/uL (ref 4.20–5.82)
RDW: 16 % — ABNORMAL HIGH (ref 11.0–14.6)
WBC: 8.5 10*3/uL (ref 4.0–10.3)

## 2017-04-12 LAB — COMPREHENSIVE METABOLIC PANEL WITH GFR
ALT: 8 U/L (ref 0–55)
AST: 14 U/L (ref 5–34)
Albumin: 3.4 g/dL — ABNORMAL LOW (ref 3.5–5.0)
Alkaline Phosphatase: 44 U/L (ref 40–150)
Anion gap: 8 (ref 3–11)
BUN: 19 mg/dL (ref 7–26)
CO2: 27 mmol/L (ref 22–29)
Calcium: 9.4 mg/dL (ref 8.4–10.4)
Chloride: 103 mmol/L (ref 98–109)
Creatinine, Ser: 1.03 mg/dL (ref 0.70–1.30)
GFR calc Af Amer: >60 mL/min (ref >60)
GFR calc non Af Amer: >60 mL/min (ref >60)
Glucose, Bld: 100 mg/dL (ref 70–140)
Potassium: 4.3 mmol/L (ref 3.5–5.1)
Sodium: 138 mmol/L (ref 136–145)
Total Bilirubin: 0.3 mg/dL (ref 0.2–1.2)
Total Protein: 6.2 g/dL — ABNORMAL LOW (ref 6.4–8.3)

## 2017-04-12 NOTE — Assessment & Plan Note (Signed)
He had moderate to severe bilateral lower extremity edema His last echocardiogram last year did not show evidence of congestive heart failure but examination today is quite concerning for that We will order echocardiogram for evaluation

## 2017-04-12 NOTE — Assessment & Plan Note (Signed)
I am concerned about potential disease progression The patient is noted to have significantly edema and I am concerned about congestive heart failure I recommend CT scan of the chest, abdomen and pelvis for evaluation I also recommend echocardiogram to exclude congestive heart failure I will  schedule follow-up appointments once I know when these tests will be done In the meantime, he will continue taking ibrutinib

## 2017-04-12 NOTE — Assessment & Plan Note (Signed)
He has significant weight loss since the last time I saw him The patient feels unwell I am concerned about disease progression and recommend CT imaging and he agreed to proceed

## 2017-04-12 NOTE — Progress Notes (Signed)
[facility] OFFICE PROGRESS NOTE  Patient Care Team: Jani Gravel, MD as PCP - General (Internal Medicine) Heath Lark, MD as Consulting Physician (Hematology and Oncology)  ASSESSMENT & PLAN:  CLL (chronic lymphocytic leukemia) ([place]) I am concerned about potential disease progression The patient is noted to have significantly edema and I am concerned about congestive heart failure I recommend CT scan of the chest, abdomen and pelvis for evaluation I also recommend echocardiogram to exclude congestive heart failure I will  schedule follow-up appointments once I know when these tests will be done In the meantime, he will continue taking ibrutinib  Anemia in neoplastic disease This is likely anemia of chronic disease. The patient denies recent history of bleeding such as epistaxis, hematuria or hematochezia. He is asymptomatic from the anemia. We will observe for now.   Bilateral leg edema He had moderate to severe bilateral lower extremity edema His last echocardiogram last year did not show evidence of congestive heart failure but examination today is quite concerning for that We will order echocardiogram for evaluation  Weight loss, non-intentional He has significant weight loss since the last time I saw him The patient feels unwell I am concerned about disease progression and recommend CT imaging and he agreed to proceed   Orders Placed This Encounter  Procedures  . CT ABDOMEN PELVIS W CONTRAST    Standing Status:   Future    Standing Expiration Date:   04/13/2018    Order Specific Question:   If indicated for the ordered procedure, I authorize the administration of contrast media per Radiology protocol    Answer:   Yes    Order Specific Question:   Preferred imaging location?    Answer:   [facility]    Order Specific Question:   Radiology Contrast Protocol - do NOT remove file path    Answer:   \\[email or web address].[email or web address]  . CT CHEST W  CONTRAST    Standing Status:   Future    Standing Expiration Date:   04/13/2018    Order Specific Question:   If indicated for the ordered procedure, I authorize the administration of contrast media per Radiology protocol    Answer:   Yes    Order Specific Question:   Preferred imaging location?    Answer:   [facility]    Order Specific Question:   Radiology Contrast Protocol - do NOT remove file path    Answer:   \\[email or web address].[email or web address]  . ECHOCARDIOGRAM COMPLETE    Standing Status:   Future    Standing Expiration Date:   07/14/2018    Order Specific Question:   Where should this test be performed    Answer:   [place]    Order Specific Question:   Perflutren DEFINITY (image enhancing agent) should be administered unless hypersensitivity or allergy exist    Answer:   Administer Perflutren    Order Specific Question:   Expected Date:    Answer:   1 week    INTERVAL HISTORY: Please see below for problem oriented charting. He returns for further follow-up He is compliant taking ibrutinib as directed He denies irregular heartbeat, recent infection, bruising or bleeding or diarrhea He complained of altered taste sensation and significant weight loss over the past month and a half He denies recent fever or chills He has noted bilateral lower extremity edema, worse than before  SUMMARY OF ONCOLOGIC HISTORY:   CLL (chronic lymphocytic leukemia) ([place])   04/03/2008 Pathology Results  Case #: [id number] FLow cytomtry confirmed CLL      10/11/2014 Tumor Marker    FISH analysis showed trisomy 12 abnormality      04/29/2015 -  Chemotherapy    He started taking Ibrutinib      04/29/2015 Imaging    Ct scan showed mild lymphadenopathy and splenomegaly      05/13/2015 Adverse Reaction    Due to neutropenia, dose of Ibrutinib is reduced to 280 mg daily      01/08/2016 Imaging    Response to therapy. Resolution of thoracic and abdominal adenopathy. Resolution  of splenomegaly. 2. No new or progressive disease. 3. Small bowel containing left inguinal hernia and right-sided hydrocele, as before. 4. Esophageal air fluid level suggests dysmotility or gastroesophageal reflux. 5. Possible constipation. 6. Suspect hepatic hemangiomas, similar.      10/09/2016 Imaging    No findings suspicious for active lymphomatous involvement.  No abdominopelvic lymphadenopathy. Spleen is normal in size.  Moderate to large left inguinal/scrotal hernia containing small bowel and fat. No evidence of bowel obstruction.  Additional stable ancillary findings as above.       REVIEW OF SYSTEMS:   Constitutional: Denies fevers, chills Eyes: Denies blurriness of vision Ears, nose, mouth, throat, and face: Denies mucositis or sore throat Respiratory: Denies cough, dyspnea or wheezes Gastrointestinal:  Denies nausea, heartburn or change in bowel habits Skin: Denies abnormal skin rashes Lymphatics: Denies new lymphadenopathy or easy bruising Neurological:Denies numbness, tingling or new weaknesses Behavioral/Psych: Mood is stable, no new changes  All other systems were reviewed with the patient and are negative.  I have reviewed the past medical history, past surgical history, social history and family history with the patient and they are unchanged from previous note.  ALLERGIES:  has No Known Allergies.  MEDICATIONS:  Current Outpatient Medications  Medication Sig Dispense Refill  . acetaminophen (TYLENOL) 325 MG tablet Take 2 tablets (650 mg total) by mouth every 4 (four) hours as needed for mild pain (or temp > 37.5 C (99.5 F)). 30 tablet 0  . aspirin 325 MG tablet Take 325 mg by mouth daily.      . IMBRUVICA 140 MG capsul TAKE 2 CAPSULES (280 MG TOTAL) BY MOUTH DAILY. 60 capsule 6  . insulin NPH-insulin regular (NOVOLIN 70/30) (70-30) 100 UNIT/ML injection Inject 15-20 Units into the skin 2 (two) times daily as needed (CBG >100). Sliding scale    . metFORMIN  (GLUCOPHAGE) 1000 MG tablet Take 1,000 mg by mouth 2 (two) times daily with a meal.     . morphine (MSIR) 15 MG tablet Take 15 mg by mouth every 6 (six) hours as needed for severe pain.   0  . Multiple Vitamin (MULTIVITAMIN WITH MINERALS) TABS tablet Take 1 tablet by mouth daily.    . pantoprazole (PROTONIX) 40 MG tablet Take 1 tablet (40 mg total) by mouth daily. 30 tablet 11  . ramipril (ALTACE) 5 MG capsule Take 5 mg by mouth daily.  0  . Tamsulosin HCl (FLOMAX) 0.4 MG CAPS Take 0.4 mg by mouth daily after supper.     . torsemide (DEMADEX) 20 MG tablet Take 20 mg by mouth daily after supper.      No current facility-administered medications for this visit.     PHYSICAL EXAMINATION: ECOG PERFORMANCE STATUS: 2 - Symptomatic, <50% confined to bed  Vitals:   04/12/17 1053  BP: (!) 144/63  Pulse: 73  Resp: 18  Temp: 97.8 F (36.6 C)  SpO2: 100%  Filed Weights   04/12/17 1053  Weight: 183 lb 12.8 oz (83.4 kg)    GENERAL:alert, no distress and comfortable SKIN: skin color, texture, turgor are normal, no rashes or significant lesions EYES: normal, Conjunctiva are pink and non-injected, sclera clear OROPHARYNX:no exudate, no erythema and lips, buccal mucosa, and tongue normal  NECK: supple, thyroid normal size, non-tender, without nodularity LYMPH:  no palpable lymphadenopathy in the cervical, axillary or inguinal LUNGS: clear to auscultation and percussion with normal breathing effort HEART: regular rate & rhythm and no murmurs with moderate bilateral lower extremity edema ABDOMEN:abdomen soft, non-tender and normal bowel sounds Musculoskeletal:no cyanosis of digits and no clubbing  NEURO: alert & oriented x 3 with fluent speech, no focal motor/sensory deficits  LABORATORY DATA:  I have reviewed the data as listed    Component Value Date/Time   NA 138 04/12/2017 1004   NA 140 10/09/2016 1106   K 4.3 04/12/2017 1004   K 4.7 10/09/2016 1106   CL 103 04/12/2017 1004   CL 104  03/28/2012 1426   CO2 27 04/12/2017 1004   CO2 28 10/09/2016 1106   GLUCOSE 100 04/12/2017 1004   GLUCOSE 127 10/09/2016 1106   GLUCOSE 171 (H) 03/28/2012 1426   BUN 19 04/12/2017 1004   BUN 20.8 10/09/2016 1106   CREATININE 1.03 04/12/2017 1004   CREATININE 1.1 10/09/2016 1106   CALCIUM 9.4 04/12/2017 1004   CALCIUM 9.1 10/09/2016 1106   PROT 6.2 (L) 04/12/2017 1004   PROT 5.8 (L) 10/09/2016 1106   ALBUMIN 3.4 (L) 04/12/2017 1004   ALBUMIN 3.1 (L) 10/09/2016 1106   AST 14 04/12/2017 1004   AST 17 10/09/2016 1106   ALT 8 04/12/2017 1004   ALT 8 10/09/2016 1106   ALKPHOS 44 04/12/2017 1004   ALKPHOS 46 10/09/2016 1106   BILITOT 0.3 04/12/2017 1004   BILITOT 0.23 10/09/2016 1106   GFRNONAA >60 04/12/2017 1004   GFRAA >60 04/12/2017 1004    No results found for: SPEP, UPEP  Lab Results  Component Value Date   WBC 8.5 04/12/2017   NEUTROABS 4.6 04/12/2017   HGB 11.1 (L) 04/12/2017   HCT 34.9 (L) 04/12/2017   MCV 82.9 04/12/2017   PLT 270 04/12/2017      Chemistry      Component Value Date/Time   NA 138 04/12/2017 1004   NA 140 10/09/2016 1106   K 4.3 04/12/2017 1004   K 4.7 10/09/2016 1106   CL 103 04/12/2017 1004   CL 104 03/28/2012 1426   CO2 27 04/12/2017 1004   CO2 28 10/09/2016 1106   BUN 19 04/12/2017 1004   BUN 20.8 10/09/2016 1106   CREATININE 1.03 04/12/2017 1004   CREATININE 1.1 10/09/2016 1106      Component Value Date/Time   CALCIUM 9.4 04/12/2017 1004   CALCIUM 9.1 10/09/2016 1106   ALKPHOS 44 04/12/2017 1004   ALKPHOS 46 10/09/2016 1106   AST 14 04/12/2017 1004   AST 17 10/09/2016 1106   ALT 8 04/12/2017 1004   ALT 8 10/09/2016 1106   BILITOT 0.3 04/12/2017 1004   BILITOT 0.23 10/09/2016 1106      All questions were answered. The patient knows to call the clinic with any problems, questions or concerns. No barriers to learning was detected.  I spent 25 minutes counseling the patient face to face. The total time spent in the appointment  was 30 minutes and more than 50% was on counseling and review of test results  Heath Lark, MD 04/12/2017 12:25 PM

## 2017-04-12 NOTE — Assessment & Plan Note (Signed)
This is likely anemia of chronic disease. The patient denies recent history of bleeding such as epistaxis, hematuria or hematochezia. He is asymptomatic from the anemia. We will observe for now.  

## 2017-04-13 ENCOUNTER — Telehealth: Payer: Self-pay

## 2017-04-13 NOTE — Telephone Encounter (Signed)
-----   Message from Heath Lark, MD sent at 04/12/2017 12:27 PM EDT ----- Regarding: CT and ECHO I just completed my notes Please try to get ECHO and CT done and let me know when they are scheduled

## 2017-04-13 NOTE — Telephone Encounter (Signed)
Called and given appt for Echo at [facility] for 3/25 at 1000 am. Instructed to check in at admitting at 9:45 am. Verbalized understanding.

## 2017-04-16 ENCOUNTER — Telehealth: Payer: Self-pay

## 2017-04-16 NOTE — Telephone Encounter (Signed)
Noted CT of chest/abd have been authorized- notified scheduling Charmilla and she will contact pt to schedule date/time.

## 2017-04-19 ENCOUNTER — Ambulatory Visit (HOSPITAL_COMMUNITY)
Admission: RE | Admit: 2017-04-19 | Discharge: 2017-04-19 | Disposition: A | Discharge status: Home / Self Care | Payer: Medicare HMO | Source: Ambulatory Visit | Attending: Hematology and Oncology | Admitting: Hematology and Oncology

## 2017-04-19 DIAGNOSIS — C9110 Chronic lymphocytic leukemia of B-cell type not having achieved remission: Secondary | ICD-10-CM

## 2017-04-19 DIAGNOSIS — I10 Essential (primary) hypertension: Secondary | ICD-10-CM | POA: Diagnosis not present

## 2017-04-19 DIAGNOSIS — E119 Type 2 diabetes mellitus without complications: Secondary | ICD-10-CM | POA: Diagnosis not present

## 2017-04-19 DIAGNOSIS — C9190 Lymphoid leukemia, unspecified not having achieved remission: Secondary | ICD-10-CM

## 2017-04-19 DIAGNOSIS — R600 Localized edema: Secondary | ICD-10-CM | POA: Diagnosis not present

## 2017-04-19 NOTE — Progress Notes (Signed)
  Echocardiogram 2D Echocardiogram has been performed.  Bobbye Charleston 04/19/2017, 10:47 AM

## 2017-04-21 MED FILL — IMBRUVICA 140 MG CAPSULE: 140 | 30 days supply | Qty: 60 | Fill #1

## 2017-04-26 ENCOUNTER — Ambulatory Visit (HOSPITAL_COMMUNITY)
Admission: RE | Admit: 2017-04-26 | Discharge: 2017-04-26 | Disposition: A | Discharge status: Home / Self Care | Payer: Medicare HMO | Source: Ambulatory Visit | Attending: Hematology and Oncology | Admitting: Hematology and Oncology

## 2017-04-26 DIAGNOSIS — K769 Liver disease, unspecified: Secondary | ICD-10-CM | POA: Diagnosis not present

## 2017-04-26 DIAGNOSIS — N329 Bladder disorder, unspecified: Secondary | ICD-10-CM | POA: Insufficient documentation

## 2017-04-26 DIAGNOSIS — R600 Localized edema: Secondary | ICD-10-CM | POA: Insufficient documentation

## 2017-04-26 DIAGNOSIS — N289 Disorder of kidney and ureter, unspecified: Secondary | ICD-10-CM | POA: Insufficient documentation

## 2017-04-26 DIAGNOSIS — K4090 Unilateral inguinal hernia, without obstruction or gangrene, not specified as recurrent: Secondary | ICD-10-CM | POA: Insufficient documentation

## 2017-04-26 DIAGNOSIS — R590 Localized enlarged lymph nodes: Secondary | ICD-10-CM | POA: Diagnosis not present

## 2017-04-26 DIAGNOSIS — C9110 Chronic lymphocytic leukemia of B-cell type not having achieved remission: Secondary | ICD-10-CM | POA: Insufficient documentation

## 2017-04-26 DIAGNOSIS — N433 Hydrocele, unspecified: Secondary | ICD-10-CM | POA: Diagnosis not present

## 2017-04-26 MED ORDER — IOPAMIDOL (ISOVUE-300) INJECTION 61%
INTRAVENOUS | Status: AC
  Filled 2017-04-26: qty 100

## 2017-04-26 MED ORDER — IOPAMIDOL (ISOVUE-300) INJECTION 61%
100.0000 mL | Freq: Once | INTRAVENOUS | Status: AC | PRN
  Administered 2017-04-26: 100 mL via INTRAVENOUS

## 2017-04-27 ENCOUNTER — Telehealth: Payer: Self-pay | Admitting: Hematology and Oncology

## 2017-04-27 NOTE — Telephone Encounter (Signed)
Mailed patient calendar of upcoming April appointments per 4/2 sch message

## 2017-04-28 ENCOUNTER — Telehealth: Payer: Self-pay

## 2017-04-28 ENCOUNTER — Telehealth: Payer: Self-pay | Admitting: Hematology and Oncology

## 2017-04-28 NOTE — Telephone Encounter (Signed)
-----   Message from Heath Lark, MD sent at 04/28/2017  9:34 AM EDT ----- Regarding: CT and echo tests PLs let him know ECHo and CT were all normal I dont know why he has edema in his previous visit. I recommend follow-up with primary care doctor. If he is ok with that, we can cancel his appt this month and reschedule with labs/see me in 3 months. Proceed to send scheduling msg

## 2017-04-28 NOTE — Telephone Encounter (Signed)
Called and left message. Ask him to call the nurse back.

## 2017-04-28 NOTE — Telephone Encounter (Signed)
Mailed patient calendar of upcoming July appointments per 4/3 sch message.

## 2017-04-28 NOTE — Telephone Encounter (Signed)
Called and given below message. Verbalized understanding. He will contact his PCP and schedule follow up. Scheduling message sent appt in 3 months.

## 2017-05-06 DIAGNOSIS — N400 Enlarged prostate without lower urinary tract symptoms: Secondary | ICD-10-CM | POA: Diagnosis not present

## 2017-05-06 DIAGNOSIS — I10 Essential (primary) hypertension: Secondary | ICD-10-CM | POA: Diagnosis not present

## 2017-05-06 DIAGNOSIS — R609 Edema, unspecified: Secondary | ICD-10-CM | POA: Diagnosis not present

## 2017-05-06 DIAGNOSIS — E118 Type 2 diabetes mellitus with unspecified complications: Secondary | ICD-10-CM | POA: Diagnosis not present

## 2017-05-11 ENCOUNTER — Ambulatory Visit: Payer: Medicare HMO | Admitting: Hematology and Oncology

## 2017-05-19 MED FILL — IMBRUVICA 140 MG CAPSULE: 140 | 30 days supply | Qty: 60 | Fill #2

## 2017-06-17 DIAGNOSIS — R69 Illness, unspecified: Secondary | ICD-10-CM | POA: Diagnosis not present

## 2017-06-24 MED FILL — IMBRUVICA 140 MG CAPSULE: 140 | 30 days supply | Qty: 60 | Fill #3

## 2017-06-28 DIAGNOSIS — R55 Syncope and collapse: Secondary | ICD-10-CM | POA: Diagnosis not present

## 2017-06-28 DIAGNOSIS — I472 Ventricular tachycardia: Secondary | ICD-10-CM | POA: Diagnosis not present

## 2017-06-28 DIAGNOSIS — I340 Nonrheumatic mitral (valve) insufficiency: Secondary | ICD-10-CM | POA: Diagnosis not present

## 2017-07-21 ENCOUNTER — Other Ambulatory Visit: Payer: Self-pay | Admitting: Hematology and Oncology

## 2017-07-21 DIAGNOSIS — C9110 Chronic lymphocytic leukemia of B-cell type not having achieved remission: Secondary | ICD-10-CM

## 2017-07-21 NOTE — Progress Notes (Signed)
[facility]  PATIENT: Michael [name] DOB: 04/28/1930   REASON FOR VISIT: Follow-up for intermittent right-sided weakness and numbness,  HISTORY FROM: Patient alone at visit    HISTORY OF PRESENT ILLNESS:Taven Dilworthis an 84 y.o.maleAfrican-American, right-handed with past medical history significant for CLL, diabetes who presents with three-day history of intermittent right-sided weakness, numbness.  The patient states that when he woke up on around 8:00 am on 10/19/2016 and noticed that he could not get up out of bed because he was unable to move and feel his rightarm and leg.  His symptoms improved gradually over the next few hours and by 11 AM he felt that most of his strength had returned.  However, he still felt numb on the right face arm and leg. He did not call 911 but eventually presented to [facility]. He also states that his symptoms started on Friday morning however resolved and occurred again on Sunday morning. He also complains of Intermittent dizziness as well as chest pain. He takes aspirin daily. Denies any other symptoms such as slurred speech, difficulty getting words out, lack of balance, visual changes. He also denies shooting pain from his neck down to his arms or legs. CT head was obtained in the emergency room which was negative for an acute stroke. He was diagnosed with CLL 2 years ago, and currently taking ibrutinib.  Date last known well: 9.21..18 Time last known well: Morning NIHSS 1 MRS 1 Patient was not administered IV t-PA secondary to arriving outside of the tPA treatment window. He was admitted to General neurology for further evaluation and treatment.   SUBJECTIVE (INTERVAL HISTORY) No family is at the bedside.  The patient is awake, alert, and follows all commands appropriately. He stated that he had right arm and leg weakness since 2 months ago, especially on waking up in the morning, he felt morning stiffness with  decreased movement of right shoulder and right hip with achy pain at right shoulder and right hip. As the days go by, right-sided weakness and achy pain improve, so that at night he is near at baseline. However the second day, it is all over again. For the last 1 week, he felt weaker and more achy pain in the morning when he woke up. He had chronic right shoulder achy pain and morning stiffness for long time, left lower extremity never had such problem. On exam, he had subjective right-sided decreased sensation, 90% to the left, and giveaway weakness on the right arm and leg. Interval history December 27, 2018CM [name], 84 year old male returns for follow-up with history of for right-sided weakness and numbness.  He also has a history of CLL.  He had no slurred speech problems with balance or visual changes.  CT negative for acute stroke MRI no acute stroke.  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  Echo 60-65% EF.  LDL 34 hemoglobin A1c 6.2.  MRI of the cervical spine without acute abnormality or abnormal cord signal.  Patient does have some right hip and shoulder stiffness.  He remains on aspirin for secondary stroke prevention.  Blood pressure in the office today 125/64.  He does not wish to have further workup for his paresthesias at this point.  He reports that his wife died about 3 months ago and that has been an adjustment.  He denies any falls.  He continues to drive without difficulty.  He returns for reevaluation UPDATE 6/27/2019CM.   [name], 84 year old  male returns for follow-up with history of right-sided weakness and numbness.  He has a history of CLL CT negative for acute stroke MRI no acute stroke MRI of the cervical spine without acute abnormality he also has insulin-dependent diabetes he remains on aspirin for secondary stroke prevention with minimal bruising and no bleeding.  He has not had further right-sided weakness symptoms like his presentation to the hospital.   Blood pressure in the office today 102 52 he says he is well-hydrated.  Both lower extremities are edematous he is unable to wear his compression stockings because he cannot get them on.  He has not wished to have further work-up for his paresthesias.  He is independent in all activities of daily living.  He continues to drive without difficulty.  He denies any falls.  He ambulates with a single-point cane.  He returns for reevaluation REVIEW OF SYSTEMS: Full 14 system review of systems performed and notable only for those listed, all others are neg:  Constitutional: neg  Cardiovascular: Leg swelling Ear/Nose/Throat: neg  Skin: neg Eyes: neg Respiratory: neg Gastroitestinal: neg  Hematology/Lymphatic: neg  Endocrine: neg Musculoskeletal: Walking difficulty Allergy/Immunology: neg Neurological: neg Psychiatric: neg Sleep : neg   ALLERGIES: No Known Allergies  HOME MEDICATIONS: Outpatient Medications Prior to Visit  Medication Sig Dispense Refill  . acetaminophen (TYLENOL) 325 MG tablet Take 2 tablets (650 mg total) by mouth every 4 (four) hours as needed for mild pain (or temp > 37.5 C (99.5 F)). 30 tablet 0  . aspirin EC 81 MG tablet Take 81 mg by mouth daily.    . IMBRUVICA 140 MG capsul TAKE 2 CAPSULES (280 MG TOTAL) BY MOUTH DAILY. 60 capsule 6  . insulin NPH-insulin regular (NOVOLIN 70/30) (70-30) 100 UNIT/ML injection Inject 15-20 Units into the skin 2 (two) times daily as needed (CBG >100). Sliding scale    . metFORMIN (GLUCOPHAGE) 1000 MG tablet Take 1,000 mg by mouth 2 (two) times daily with a meal.     . morphine (MSIR) 15 MG tablet Take 15 mg by mouth every 6 (six) hours as needed for severe pain.   0  . Multiple Vitamin (MULTIVITAMIN WITH MINERALS) TABS tablet Take 1 tablet by mouth daily.    . pantoprazole (PROTONIX) 40 MG tablet Take 1 tablet (40 mg total) by mouth daily. 30 tablet 11  . ramipril (ALTACE) 5 MG capsule Take 5 mg by mouth daily.  0  . Tamsulosin HCl  (FLOMAX) 0.4 MG CAPS Take 0.4 mg by mouth daily after supper.     . torsemide (DEMADEX) 20 MG tablet Take 20 mg by mouth daily after supper.     Marland Kitchen aspirin 325 MG tablet Take 325 mg by mouth daily.       No facility-administered medications prior to visit.     PAST MEDICAL HISTORY: Past Medical History:  Diagnosis Date  . Arthritis    "left shoulder, lower back" (10/20/2016)  . CLL (chronic lymphoblastic leukemia) dx'd 2000  . Family history of adverse reaction to anesthesia    "daughter's BP drops"   . GERD (gastroesophageal reflux disease)   . History of hiatal hernia   . Leukemia, chronic lymphoid ([place]) 12/08/2010  . Lymphocytosis   . Type II diabetes mellitus ([place])     PAST SURGICAL HISTORY: Past Surgical History:  Procedure Laterality Date  . CATARACT EXTRACTION W/ INTRAOCULAR LENS  IMPLANT, BILATERAL Bilateral     FAMILY HISTORY: Family History  Problem Relation Age of Onset  .  Cancer Mother   . Hypertension Father   . Diabetes Father     SOCIAL HISTORY: Social History   Socioeconomic History  . Marital status: Widowed    Spouse name: Not on file  . Number of children: Not on file  . Years of education: Not on file  . Highest education level: Not on file  Occupational History  . Not on file  Social Needs  . Financial resource strain: Not on file  . Food insecurity:    Worry: Not on file    Inability: Not on file  . Transportation needs:    Medical: Not on file    Non-medical: Not on file  Tobacco Use  . Smoking status: Never Smoker  . Smokeless tobacco: Former Systems developer    Types: Snuff  Substance and Sexual Activity  . Alcohol use: No  . Drug use: No  . Sexual activity: Not on file  Lifestyle  . Physical activity:    Days per week: Not on file    Minutes per session: Not on file  . Stress: Not on file  Relationships  . Social connections:    Talks on phone: Not on file    Gets together: Not on file    Attends religious service: Not on file     Active member of club or organization: Not on file    Attends meetings of clubs or organizations: Not on file    Relationship status: Not on file  . Intimate partner violence:    Fear of current or ex partner: Not on file    Emotionally abused: Not on file    Physically abused: Not on file    Forced sexual activity: Not on file  Other Topics Concern  . Not on file  Social History Narrative  . Not on file     PHYSICAL EXAM  Vitals:   07/22/17 1346  Weight: 184 lb 12.8 oz (83.8 kg)  Height: 5\' 7"  (1.702 m)   Body mass index is 28.94 kg/m.  Generalized: Well developed, in no acute distress  Head: normocephalic and atraumatic,. Oropharynx benign  Neck: Supple, no carotid bruits  Cardiac: Regular rate rhythm, no murmur  Musculoskeletal: No deformity  Skin 2-3+ edema both lower extremities  Neurological examination   Mentation: Alert oriented to time, place, history taking. Attention span and concentration appropriate. Recent and remote memory intact.  Follows all commands speech and language fluent.   Cranial nerve II-XII: .Pupils were equal round reactive to light extraocular movements were full, visual field were full on confrontational test. Facial sensation and strength were normal. hearing was intact to finger rubbing bilaterally. Uvula tongue midline. head turning and shoulder shrug were normal and symmetric.Tongue protrusion into cheek strength was normal. Motor: normal bulk and tone, full strength in the BUE, BLE,  Sensory: normal and symmetric to light touch, pinprick, and  Vibration, in the upper and lower extremities Coordination: finger-nose-finger, heel-to-shin bilaterally, no dysmetria Reflexes: 1+ upper and lower and symmetric, plantar responses were flexor bilaterally. Gait and Station: Rising up from seated position without assistance, normal stance,  moderate stride, good arm swing, smooth turning, able to perform tiptoe, and heel walking without difficulty.  Tandem gait is unsteady.  Romberg negative.  Ambulates with single-point cane   DIAGNOSTIC DATA (LABS, IMAGING, TESTING) - I reviewed patient records, labs, notes, testing and imaging myself where available.  Lab Results  Component Value Date   WBC 8.5 04/12/2017   HGB 11.1 (L) 04/12/2017  HCT 34.9 (L) 04/12/2017   MCV 82.9 04/12/2017   PLT 270 04/12/2017      Component Value Date/Time   NA 138 04/12/2017 1004   NA 140 10/09/2016 1106   K 4.3 04/12/2017 1004   K 4.7 10/09/2016 1106   CL 103 04/12/2017 1004   CL 104 03/28/2012 1426   CO2 27 04/12/2017 1004   CO2 28 10/09/2016 1106   GLUCOSE 100 04/12/2017 1004   GLUCOSE 127 10/09/2016 1106   GLUCOSE 171 (H) 03/28/2012 1426   BUN 19 04/12/2017 1004   BUN 20.8 10/09/2016 1106   CREATININE 1.03 04/12/2017 1004   CREATININE 1.1 10/09/2016 1106   CALCIUM 9.4 04/12/2017 1004   CALCIUM 9.1 10/09/2016 1106   PROT 6.2 (L) 04/12/2017 1004   PROT 5.8 (L) 10/09/2016 1106   ALBUMIN 3.4 (L) 04/12/2017 1004   ALBUMIN 3.1 (L) 10/09/2016 1106   AST 14 04/12/2017 1004   AST 17 10/09/2016 1106   ALT 8 04/12/2017 1004   ALT 8 10/09/2016 1106   ALKPHOS 44 04/12/2017 1004   ALKPHOS 46 10/09/2016 1106   BILITOT 0.3 04/12/2017 1004   BILITOT 0.23 10/09/2016 1106   GFRNONAA >60 04/12/2017 1004   GFRAA >60 04/12/2017 1004   Lab Results  Component Value Date   CHOL 95 10/20/2016   HDL 52 10/20/2016   LDLCALC 34 10/20/2016   TRIG 44 10/20/2016   CHOLHDL 1.8 10/20/2016   Lab Results  Component Value Date   HGBA1C 6.2 (H) 10/20/2016   ASSESSMENT AND PLAN  84 y.o. year old male  has a past medical history of Arthritis, CLL (chronic lymphoblastic leukemia) (dx'd 2000),  Leukemia, chronic lymphoid ([place]) (12/08/2010), Lymphocytosis, and Type II diabetes mellitus ([place]). here for follow-up for intermittent right sided numbness and weakness .CT negative for acute stroke MRI no acute stroke.  CTA head and neck moderate left M2 and right M3  stenosis.  Echo 60-65% EF.  LDL 34 hemoglobin A1c 6.2.  MRI of the cervical spine without acute abnormality or abnormal cord signal. The patient is a current patient of Dr. Erlinda Hong who is no longer at this office.       Stressed the importance of management of risk factors to prevent further stroke like symptoms Continue aspirin for secondary stroke prevention Maintain strict control of hypertension with blood pressure goal below 130/90, today's reading 102/52 Control of diabetes with hemoglobin A1c below 6.5 followed by primary care  continue diabetic medications Metformin Cholesterol with LDL cholesterol less than 70, followed by primary care,   Exercise by walking, 30 minutes daily with cane if outside Keep legs elevated when sitting eat healthy diet with whole grains,  fresh fruits and vegetables Patient does not wish any further workup for his intermittent paresthesias at this time Discharge from stroke clinic [facility]enn[facility], [facility], [facility], [facility] [address] [place], [place] [address] [phone]

## 2017-07-22 ENCOUNTER — Encounter: Payer: Self-pay | Admitting: Nurse Practitioner

## 2017-07-22 ENCOUNTER — Ambulatory Visit: Payer: Medicare HMO | Admitting: Nurse Practitioner

## 2017-07-22 VITALS — Ht 67.0 in | Wt 184.8 lb

## 2017-07-22 DIAGNOSIS — R202 Paresthesia of skin: Secondary | ICD-10-CM

## 2017-07-22 NOTE — Patient Instructions (Addendum)
Stressed the importance of management of risk factors to prevent further stroke like symptoms Continue aspirin for secondary stroke prevention Maintain strict control of hypertension with blood pressure goal below 130/90, today's reading 102/52 Control of diabetes with hemoglobin A1c below 6.5 followed by primary care  continue diabetic medications Metformin Cholesterol with LDL cholesterol less than 70, followed by primary care,   Exercise by walking, 30 minutes daily with cane if outside Keep legs elevated when sitting eat healthy diet with whole grains,  fresh fruits and vegetables Discharge from stroke clinic  Stroke Prevention Some health problems and behaviors may make it more likely for you to have a stroke. Below are ways to lessen your risk of having a stroke.  Be active for at least 30 minutes on most or all days.  Do not smoke. Try not to be around others who smoke.  Do not drink too much alcohol. ? Do not have more than 2 drinks a day if you are a man. ? Do not have more than 1 drink a day if you are a woman and are not pregnant.  Eat healthy foods, such as fruits and vegetables. If you were put on a specific diet, follow the diet as told.  Keep your cholesterol levels under control through diet and medicines. Look for foods that are low in saturated fat, trans fat, cholesterol, and are high in fiber.  If you have diabetes, follow all diet plans and take your medicine as told.  Ask your doctor if you need treatment to lower your blood pressure. If you have high blood pressure (hypertension), follow all diet plans and take your medicine as told by your doctor.  If you are 39-2 years old, have your blood pressure checked every 3-5 years. If you are age 77 or older, have your blood pressure checked every year.  Keep a healthy weight. Eat foods that are low in calories, salt, saturated fat, trans fat, and cholesterol.  Do not take drugs.  Avoid birth control pills, if this  applies. Talk to your doctor about the risks of taking birth control pills.  Talk to your doctor if you have sleep problems (sleep apnea).  Take all medicine as told by your doctor. ? You may be told to take aspirin or blood thinner medicine. Take this medicine as told by your doctor. ? Understand your medicine instructions.  Make sure any other conditions you have are being taken care of.  Get help right away if:  You suddenly lose feeling (you feel numb) or have weakness in your face, arm, or leg.  Your face or eyelid hangs down to one side.  You suddenly feel confused.  You have trouble talking (aphasia) or understanding what people are saying.  You suddenly have trouble seeing in one or both eyes.  You suddenly have trouble walking.  You are dizzy.  You lose your balance or your movements are clumsy (uncoordinated).  You suddenly have a very bad headache and you do not know the cause.  You have new chest pain.  Your heart feels like it is fluttering or skipping a beat (irregular heartbeat). Do not wait to see if the symptoms above go away. Get help right away. Call your local emergency services (911 in [place] Do not drive yourself to the hospital. This information is not intended to replace advice given to you by your health care provider. Make sure you discuss any questions you have with your health care provider. Document Released: 07/14/2011  Document Revised: 06/20/2015 Document Reviewed: 07/15/2012 Elsevier Interactive Patient Education  [employer].

## 2017-07-27 ENCOUNTER — Inpatient Hospital Stay (HOSPITAL_BASED_OUTPATIENT_CLINIC_OR_DEPARTMENT_OTHER): Payer: Medicare HMO | Admitting: Hematology and Oncology

## 2017-07-27 ENCOUNTER — Encounter: Payer: Self-pay | Admitting: Hematology and Oncology

## 2017-07-27 ENCOUNTER — Telehealth: Payer: Self-pay | Admitting: Hematology and Oncology

## 2017-07-27 ENCOUNTER — Inpatient Hospital Stay: Payer: Medicare HMO | Attending: Hematology and Oncology

## 2017-07-27 VITALS — BP 115/52 | HR 77 | Temp 98.2°F | Resp 18 | Ht 67.0 in | Wt 182.8 lb

## 2017-07-27 DIAGNOSIS — C9190 Lymphoid leukemia, unspecified not having achieved remission: Secondary | ICD-10-CM | POA: Insufficient documentation

## 2017-07-27 DIAGNOSIS — K4090 Unilateral inguinal hernia, without obstruction or gangrene, not specified as recurrent: Secondary | ICD-10-CM

## 2017-07-27 DIAGNOSIS — C9110 Chronic lymphocytic leukemia of B-cell type not having achieved remission: Secondary | ICD-10-CM

## 2017-07-27 DIAGNOSIS — R600 Localized edema: Secondary | ICD-10-CM | POA: Diagnosis not present

## 2017-07-27 LAB — CBC WITH DIFFERENTIAL/PLATELET
Basophils Absolute: 0.1 10*3/uL (ref 0.0–0.1)
Basophils Relative: 1 %
Eosinophils Absolute: 0.1 10*3/uL (ref 0.0–0.5)
Eosinophils Relative: 2 %
HCT: 32.7 % — ABNORMAL LOW (ref 38.4–49.9)
HEMOGLOBIN: 10.5 g/dL — AB (ref 13.0–17.1)
LYMPHS PCT: 45 %
Lymphs Abs: 3.2 10*3/uL (ref 0.9–3.3)
MCH: 26.6 pg — AB (ref 27.2–33.4)
MCHC: 32.2 g/dL (ref 32.0–36.0)
MCV: 82.7 fL (ref 79.3–98.0)
Monocytes Absolute: 0.4 10*3/uL (ref 0.1–0.9)
Monocytes Relative: 6 %
NEUTROS PCT: 46 %
Neutro Abs: 3.3 10*3/uL (ref 1.5–6.5)
Platelets: 328 10*3/uL (ref 140–400)
RBC: 3.95 MIL/uL — AB (ref 4.20–5.82)
RDW: 16 % — ABNORMAL HIGH (ref 11.0–14.6)
WBC: 7.2 10*3/uL (ref 4.0–10.3)

## 2017-07-27 LAB — COMPREHENSIVE METABOLIC PANEL
ALK PHOS: 52 U/L (ref 38–126)
ALT: 8 U/L (ref 0–44)
AST: 15 U/L (ref 15–41)
Albumin: 3.4 g/dL — ABNORMAL LOW (ref 3.5–5.0)
Anion gap: 5 (ref 5–15)
BUN: 27 mg/dL — ABNORMAL HIGH (ref 8–23)
CALCIUM: 9 mg/dL (ref 8.9–10.3)
CO2: 34 mmol/L — ABNORMAL HIGH (ref 22–32)
Chloride: 98 mmol/L (ref 98–111)
Creatinine, Ser: 1.13 mg/dL (ref 0.61–1.24)
GFR, EST NON AFRICAN AMERICAN: 58 mL/min — AB (ref >60)
Glucose, Bld: 178 mg/dL — ABNORMAL HIGH (ref 70–99)
Potassium: 4.1 mmol/L (ref 3.5–5.1)
Sodium: 137 mmol/L (ref 135–145)
Total Bilirubin: <0.2 mg/dL — ABNORMAL LOW (ref 0.3–1.2)
Total Protein: 6.3 g/dL — ABNORMAL LOW (ref 6.5–8.1)

## 2017-07-27 LAB — LACTATE DEHYDROGENASE: LDH: 154 U/L (ref 98–192)

## 2017-07-27 MED FILL — IMBRUVICA 140 MG CAPSULE: 140 | 30 days supply | Qty: 60 | Fill #4

## 2017-07-27 NOTE — Telephone Encounter (Signed)
Gave patient avs and calendar of upcoming November appts.  °

## 2017-07-27 NOTE — Assessment & Plan Note (Signed)
He has profound hernia limiting his physical activity We discussed the risk and benefits of obtaining second opinion from general surgery to see if hernia repair is feasible as it is impairing his mobility He is interested to be referred

## 2017-07-27 NOTE — Assessment & Plan Note (Signed)
His recent CT imaging showed no evidence of cancer progression/recurrence In the meantime, he will continue taking ibrutinib

## 2017-07-27 NOTE — Assessment & Plan Note (Signed)
The cause of bilateral lower extremity edema is unknown Recent echocardiogram showed no evidence of congestive heart failure CT scan showed no evidence of disease/new lymphadenopathy causing venous obstruction I suspect this is due to compression of his inguinal region from severe hernia For now, I recommend conservative management only

## 2017-07-27 NOTE — Progress Notes (Signed)
[place] OFFICE PROGRESS NOTE  Patient Care Team: Jani Gravel, MD as PCP - General (Internal Medicine) Heath Lark, MD as Consulting Physician (Hematology and Oncology)  ASSESSMENT & PLAN:  CLL (chronic lymphocytic leukemia) ([place]) His recent CT imaging showed no evidence of cancer progression/recurrence In the meantime, he will continue taking ibrutinib  Bilateral leg edema The cause of bilateral lower extremity edema is unknown Recent echocardiogram showed no evidence of congestive heart failure CT scan showed no evidence of disease/new lymphadenopathy causing venous obstruction I suspect this is due to compression of his inguinal region from severe hernia For now, I recommend conservative management only  Left inguinal hernia He has profound hernia limiting his physical activity We discussed the risk and benefits of obtaining second opinion from general surgery to see if hernia repair is feasible as it is impairing his mobility He is interested to be referred   Orders Placed This Encounter  Procedures  . Ambulatory referral to General Surgery    Referral Priority:   Routine    Referral Type:   Surgical    Referral Reason:   Specialty Services Required    Referred to Provider:   Coralie Keens, MD    Requested Specialty:   General Surgery    Number of Visits Requested:   1    INTERVAL HISTORY: Please see below for problem oriented charting. He returns for further follow-up He feels well He has no difficulties getting his prescription chemotherapy He denies recent infection No new lymphadenopathy He is troubled by persistent leg edema and poor mobility secondary to hernia  SUMMARY OF ONCOLOGIC HISTORY:   CLL (chronic lymphocytic leukemia) ([place])   04/03/2008 Pathology Results    Case #: [id number] FLow cytomtry confirmed CLL      10/11/2014 Tumor Marker    FISH analysis showed trisomy 12 abnormality      04/29/2015 -  Chemotherapy    He started  taking Ibrutinib      04/29/2015 Imaging    Ct scan showed mild lymphadenopathy and splenomegaly      05/13/2015 Adverse Reaction    Due to neutropenia, dose of Ibrutinib is reduced to 280 mg daily      01/08/2016 Imaging    Response to therapy. Resolution of thoracic and abdominal adenopathy. Resolution of splenomegaly. 2. No new or progressive disease. 3. Small bowel containing left inguinal hernia and right-sided hydrocele, as before. 4. Esophageal air fluid level suggests dysmotility or gastroesophageal reflux. 5. Possible constipation. 6. Suspect hepatic hemangiomas, similar.      10/09/2016 Imaging    No findings suspicious for active lymphomatous involvement.  No abdominopelvic lymphadenopathy. Spleen is normal in size.  Moderate to large left inguinal/scrotal hernia containing small bowel and fat. No evidence of bowel obstruction.  Additional stable ancillary findings as above.      04/26/2017 Imaging    1. No lymphadenopathy in the chest, abdomen, or pelvis. 2. Mild circumferential bladder wall thickening. Incomplete distention may contribute to this appearance, but bladder infection is also consideration. 3. Stable appearance small hepatic lesions. Imaging features most suggestive of benign cavernous hemangiomas. 4. Stable tiny low-density renal lesions compatible with cysts. 5. Large left groin hernia contains numerous small bowel loops without complicating features. 6. Right-sided hydrocele.       REVIEW OF SYSTEMS:   Constitutional: Denies fevers, chills or abnormal weight loss Eyes: Denies blurriness of vision Ears, nose, mouth, throat, and face: Denies mucositis or sore throat Respiratory: Denies cough, dyspnea or  wheezes Cardiovascular: Denies palpitation, chest discomfort Gastrointestinal:  Denies nausea, heartburn or change in bowel habits Skin: Denies abnormal skin rashes Lymphatics: Denies new lymphadenopathy or easy bruising Neurological:Denies numbness,  tingling or new weaknesses Behavioral/Psych: Mood is stable, no new changes  All other systems were reviewed with the patient and are negative.  I have reviewed the past medical history, past surgical history, social history and family history with the patient and they are unchanged from previous note.  ALLERGIES:  has No Known Allergies.  MEDICATIONS:  Current Outpatient Medications  Medication Sig Dispense Refill  . acetaminophen (TYLENOL) 325 MG tablet Take 2 tablets (650 mg total) by mouth every 4 (four) hours as needed for mild pain (or temp > 37.5 C (99.5 F)). 30 tablet 0  . aspirin EC 81 MG tablet Take 81 mg by mouth daily.    . IMBRUVICA 140 MG capsul TAKE 2 CAPSULES (280 MG TOTAL) BY MOUTH DAILY. 60 capsule 6  . insulin NPH-insulin regular (NOVOLIN 70/30) (70-30) 100 UNIT/ML injection Inject 15-20 Units into the skin 2 (two) times daily as needed (CBG >100). Sliding scale    . metFORMIN (GLUCOPHAGE) 1000 MG tablet Take 1,000 mg by mouth 2 (two) times daily with a meal.     . morphine (MSIR) 15 MG tablet Take 15 mg by mouth every 6 (six) hours as needed for severe pain.   0  . Multiple Vitamin (MULTIVITAMIN WITH MINERALS) TABS tablet Take 1 tablet by mouth daily.    . pantoprazole (PROTONIX) 40 MG tablet Take 1 tablet (40 mg total) by mouth daily. 30 tablet 11  . ramipril (ALTACE) 5 MG capsule Take 5 mg by mouth daily.  0  . Tamsulosin HCl (FLOMAX) 0.4 MG CAPS Take 0.4 mg by mouth daily after supper.     . torsemide (DEMADEX) 20 MG tablet Take 20 mg by mouth daily after supper.      No current facility-administered medications for this visit.     PHYSICAL EXAMINATION: ECOG PERFORMANCE STATUS: 2 - Symptomatic, <50% confined to bed  Vitals:   07/27/17 1307  BP: (!) 115/52  Pulse: 77  Resp: 18  Temp: 98.2 F (36.8 C)  SpO2: 99%   Filed Weights   07/27/17 1307  Weight: 182 lb 12.8 oz (82.9 kg)    GENERAL:alert, no distress and comfortable SKIN: skin color, texture,  turgor are normal, no rashes or significant lesions EYES: normal, Conjunctiva are pink and non-injected, sclera clear OROPHARYNX:no exudate, no erythema and lips, buccal mucosa, and tongue normal  NECK: supple, thyroid normal size, non-tender, without nodularity LYMPH:  no palpable lymphadenopathy in the cervical, axillary or inguinal LUNGS: clear to auscultation and percussion with normal breathing effort HEART: regular rate & rhythm and no murmurs mild bilateral lower extremity edema ABDOMEN:abdomen soft, non-tender and normal bowel sounds.  Large inguinal hernia bilaterally Musculoskeletal:no cyanosis of digits and no clubbing  NEURO: alert & oriented x 3 with fluent speech, no focal motor/sensory deficits  LABORATORY DATA:  I have reviewed the data as listed    Component Value Date/Time   NA 137 07/27/2017 1230   NA 140 10/09/2016 1106   K 4.1 07/27/2017 1230   K 4.7 10/09/2016 1106   CL 98 07/27/2017 1230   CL 104 03/28/2012 1426   CO2 34 (H) 07/27/2017 1230   CO2 28 10/09/2016 1106   GLUCOSE 178 (H) 07/27/2017 1230   GLUCOSE 127 10/09/2016 1106   GLUCOSE 171 (H) 03/28/2012 1426   BUN  27 (H) 07/27/2017 1230   BUN 20.8 10/09/2016 1106   CREATININE 1.13 07/27/2017 1230   CREATININE 1.1 10/09/2016 1106   CALCIUM 9.0 07/27/2017 1230   CALCIUM 9.1 10/09/2016 1106   PROT 6.3 (L) 07/27/2017 1230   PROT 5.8 (L) 10/09/2016 1106   ALBUMIN 3.4 (L) 07/27/2017 1230   ALBUMIN 3.1 (L) 10/09/2016 1106   AST 15 07/27/2017 1230   AST 17 10/09/2016 1106   ALT 8 07/27/2017 1230   ALT 8 10/09/2016 1106   ALKPHOS 52 07/27/2017 1230   ALKPHOS 46 10/09/2016 1106   BILITOT <0.2 (L) 07/27/2017 1230   BILITOT 0.23 10/09/2016 1106   GFRNONAA 58 (L) 07/27/2017 1230   GFRAA >60 07/27/2017 1230    No results found for: SPEP, UPEP  Lab Results  Component Value Date   WBC 7.2 07/27/2017   NEUTROABS 3.3 07/27/2017   HGB 10.5 (L) 07/27/2017   HCT 32.7 (L) 07/27/2017   MCV 82.7 07/27/2017    PLT 328 07/27/2017      Chemistry      Component Value Date/Time   NA 137 07/27/2017 1230   NA 140 10/09/2016 1106   K 4.1 07/27/2017 1230   K 4.7 10/09/2016 1106   CL 98 07/27/2017 1230   CL 104 03/28/2012 1426   CO2 34 (H) 07/27/2017 1230   CO2 28 10/09/2016 1106   BUN 27 (H) 07/27/2017 1230   BUN 20.8 10/09/2016 1106   CREATININE 1.13 07/27/2017 1230   CREATININE 1.1 10/09/2016 1106      Component Value Date/Time   CALCIUM 9.0 07/27/2017 1230   CALCIUM 9.1 10/09/2016 1106   ALKPHOS 52 07/27/2017 1230   ALKPHOS 46 10/09/2016 1106   AST 15 07/27/2017 1230   AST 17 10/09/2016 1106   ALT 8 07/27/2017 1230   ALT 8 10/09/2016 1106   BILITOT <0.2 (L) 07/27/2017 1230   BILITOT 0.23 10/09/2016 1106      All questions were answered. The patient knows to call the clinic with any problems, questions or concerns. No barriers to learning was detected.  I spent 15 minutes counseling the patient face to face. The total time spent in the appointment was 20 minutes and more than 50% was on counseling and review of test results  Heath Lark, MD 07/27/2017 4:18 PM

## 2017-07-28 ENCOUNTER — Telehealth: Payer: Self-pay

## 2017-07-28 NOTE — Telephone Encounter (Signed)
Faxed referral to [facility] [phone].

## 2017-08-16 DIAGNOSIS — D649 Anemia, unspecified: Secondary | ICD-10-CM | POA: Diagnosis not present

## 2017-08-16 DIAGNOSIS — E118 Type 2 diabetes mellitus with unspecified complications: Secondary | ICD-10-CM | POA: Diagnosis not present

## 2017-08-16 DIAGNOSIS — E7800 Pure hypercholesterolemia, unspecified: Secondary | ICD-10-CM | POA: Diagnosis not present

## 2017-08-23 DIAGNOSIS — E118 Type 2 diabetes mellitus with unspecified complications: Secondary | ICD-10-CM | POA: Diagnosis not present

## 2017-08-23 DIAGNOSIS — Z79899 Other long term (current) drug therapy: Secondary | ICD-10-CM | POA: Diagnosis not present

## 2017-08-23 DIAGNOSIS — Z0000 Encounter for general adult medical examination without abnormal findings: Secondary | ICD-10-CM | POA: Diagnosis not present

## 2017-08-23 DIAGNOSIS — C9110 Chronic lymphocytic leukemia of B-cell type not having achieved remission: Secondary | ICD-10-CM | POA: Diagnosis not present

## 2017-08-23 DIAGNOSIS — I10 Essential (primary) hypertension: Secondary | ICD-10-CM | POA: Diagnosis not present

## 2017-08-25 MED FILL — IMBRUVICA 140 MG CAPSULE: 140 | 30 days supply | Qty: 60 | Fill #5

## 2017-08-28 DIAGNOSIS — R69 Illness, unspecified: Secondary | ICD-10-CM | POA: Diagnosis not present

## 2017-08-30 DIAGNOSIS — H35043 Retinal micro-aneurysms, unspecified, bilateral: Secondary | ICD-10-CM | POA: Diagnosis not present

## 2017-08-30 DIAGNOSIS — H31009 Unspecified chorioretinal scars, unspecified eye: Secondary | ICD-10-CM | POA: Diagnosis not present

## 2017-08-30 DIAGNOSIS — H35371 Puckering of macula, right eye: Secondary | ICD-10-CM | POA: Diagnosis not present

## 2017-08-30 DIAGNOSIS — E113553 Type 2 diabetes mellitus with stable proliferative diabetic retinopathy, bilateral: Secondary | ICD-10-CM | POA: Diagnosis not present

## 2017-08-31 DIAGNOSIS — Z79899 Other long term (current) drug therapy: Secondary | ICD-10-CM | POA: Diagnosis not present

## 2017-08-31 DIAGNOSIS — M5416 Radiculopathy, lumbar region: Secondary | ICD-10-CM | POA: Diagnosis not present

## 2017-08-31 DIAGNOSIS — M545 Low back pain: Secondary | ICD-10-CM | POA: Diagnosis not present

## 2017-09-08 ENCOUNTER — Other Ambulatory Visit: Payer: Self-pay | Admitting: Surgery

## 2017-09-08 DIAGNOSIS — K4090 Unilateral inguinal hernia, without obstruction or gangrene, not specified as recurrent: Secondary | ICD-10-CM | POA: Diagnosis not present

## 2017-09-13 ENCOUNTER — Encounter (HOSPITAL_COMMUNITY): Payer: Self-pay | Admitting: *Deleted

## 2017-09-13 ENCOUNTER — Telehealth: Payer: Self-pay

## 2017-09-13 ENCOUNTER — Other Ambulatory Visit: Payer: Self-pay

## 2017-09-13 NOTE — Telephone Encounter (Signed)
He called and left a message to call him.  Called back. He is is having surgery on 8/21 for a hernia. He was told to call and ask about how to take the [place].

## 2017-09-13 NOTE — Progress Notes (Signed)
Spoke with pt for pre-op call. Pt denies cardiac history or HTN. Pt is a type 2 Diabetic. Last A1C was 6.9 on 08/16/17. Pt states his fasting blood sugar is usually between 78-95. Pt instructed to take 1/2 of his regular sliding scale insulin dose for his Novolin 70/30 Insulin Tuesday PM. Instructed him not to use his Novolin 70/30 morning of surgery and not to take his Metformin morning of surgery. Instructed pt to check his blood sugar when he gets up Wednesday AM and every 2 hours until he leaves for the hospital. If blood sugar is 70 or below, treat with 1/2 cup of clear juice (apple or cranberry) and recheck blood sugar 15 minutes after drinking juice. If blood sugar continues to be 70 or below, call the Short Stay department and ask to speak to a nurse. He voiced understanding. Pt also instructed to call Dr. Calton Dach office to ask him if he needs to stop the Imbruvica prior to surgery. I told him to call him today and find out and to follow whatever instructions they give him. He again voiced understanding. Pt instructed to hold his Ibuprofen and Multivitamins as of today prior to surgery.

## 2017-09-14 NOTE — Telephone Encounter (Signed)
Called back per Dr. Alvy Bimler and told him to contact the surgeon and reschedule the surgery. He needs to stop the Ibrutinib 5 days prior to surgery and resume 1 week after surgery. He verbalized understanding. Instructed to call back with new surgery date and office will  go over instructions again. He verbalized understanding.

## 2017-09-14 NOTE — Telephone Encounter (Signed)
Called and given below message. He said he took the Ibrutinib today at 6 am. Told him that I will give Dr. Alvy Bimler the above message and call him back. He verbalized understanding.

## 2017-09-14 NOTE — Telephone Encounter (Signed)
Stop ibrutinib today Resume 1 week after surgery

## 2017-09-14 NOTE — Progress Notes (Signed)
Spoke with patient by phone and patient stated his blood thinning medication imbruvica must be stopped for 5 days per dr Ronnald Nian oncology prior to surgery, called particia at [facility] and let her know surgery must be rescheduled per dr Ronnald Nian due to imbruvica not held prior to surgery. Mardene Celeste to make dr Nathaneil Canary blackman aware.

## 2017-09-14 NOTE — H&P (Signed)
Jillyn Hidden Documented: 09/08/2017 3:22 PM Location: [facility] Patient #: [id number] DOB: [birth year] Widowed / Language: Cleophus Molt / Race: Black or African American Male   History of Present Illness Nathaneil Canary A. Ninfa Linden MD; 09/08/2017 3:38 PM) The patient is a 84 year old male who presents with an inguinal hernia. This is a pleasant gentleman referred by Dr. Jani Gravel for evaluation of a large left inguinal hernia. He has a significant history of CLL. He is seen by oncology for this. He has had a large left inguinal hernia for several years. It is now getting much larger. A CT scan shows a moderate amount of small bowel in the left inguinal hernia. He currently has had no nausea or vomiting. He reports discomfort daily which is mild.   Past Surgical History Malachi Bonds, CMA; 09/08/2017 3:23 PM) Cataract Surgery  Bilateral. Colon Polyp Removal - Colonoscopy   Diagnostic Studies History Malachi Bonds, CMA; 09/08/2017 3:23 PM) Colonoscopy  within last year  Allergies Malachi Bonds, CMA; 09/08/2017 3:23 PM) No Known Drug Allergies [09/08/2017]:  Medication History Malachi Bonds, CMA; 09/08/2017 3:25 PM) Kate Sable (140MG  Capsule, Oral) Active. NovoLIN 70/30 ((70-30) 100UNIT/ML Suspension, Subcutaneous) Active. MetFORMIN HCl (1000MG  Tablet, Oral) Active. Pantoprazole Sodium (40MG  Tablet DR, Oral) Active. Morphine Sulfate (15MG  Tablet, Oral) Active. Ramipril (5MG  Capsule, Oral) Active. Torsemide (20MG  Tablet, Oral) Active. Tamsulosin HCl (0.4MG  Capsule, Oral) Active. Multivitamin Adult (Oral) Active. Aspirin (81MG  Tablet, Oral) Active. Medications Reconciled  Social History Malachi Bonds, CMA; 09/08/2017 3:23 PM) Alcohol use  Remotely quit alcohol use. Caffeine use  Carbonated beverages, Coffee, Tea. No drug use   Family History Malachi Bonds, CMA; 09/08/2017 3:23 PM) Arthritis  Mother. Bleeding disorder  Mother. Colon Polyps   Daughter. Depression  Daughter. Diabetes Mellitus  Daughter, Mother. Heart Disease  Daughter. Hypertension  Daughter, Mother, Son. Melanoma  Mother. Respiratory Condition  Daughter. Seizure disorder  Daughter. Thyroid problems  Daughter, Son.  Other Problems Malachi Bonds, CMA; 09/08/2017 3:23 PM) Arthritis  Back Pain  Chest pain  Congestive Heart Failure  Diabetes Mellitus  Enlarged Prostate  Gastroesophageal Reflux Disease  Hemorrhoids  High blood pressure  Inguinal Hernia  Prostate Cancer  Seizure Disorder     Review of Systems Malachi Bonds CMA; 09/08/2017 3:23 PM) General Present- Appetite Loss and Fatigue. Not Present- Chills, Fever, Night Sweats, Weight Gain and Weight Loss. Skin Present- Dryness. Not Present- Change in Wart/Mole, Hives, Jaundice, New Lesions, Non-Healing Wounds, Rash and Ulcer. HEENT Present- Seasonal Allergies, Sinus Pain and Wears glasses/contact lenses. Not Present- Earache, Hearing Loss, Hoarseness, Nose Bleed, Oral Ulcers, Ringing in the Ears, Sore Throat, Visual Disturbances and Yellow Eyes. Respiratory Present- Difficulty Breathing. Not Present- Bloody sputum, Chronic Cough, Snoring and Wheezing. Breast Not Present- Breast Mass, Breast Pain, Nipple Discharge and Skin Changes. Cardiovascular Present- Chest Pain, Leg Cramps, Rapid Heart Rate, Shortness of Breath and Swelling of Extremities. Not Present- Difficulty Breathing Lying Down and Palpitations. Gastrointestinal Present- Abdominal Pain, Bloating, Constipation, Excessive gas, Gets full quickly at meals and Indigestion. Not Present- Bloody Stool, Change in Bowel Habits, Chronic diarrhea, Difficulty Swallowing, Hemorrhoids, Nausea, Rectal Pain and Vomiting. Male Genitourinary Present- Impotence, Painful Urination and Urgency. Not Present- Blood in Urine, Change in Urinary Stream, Frequency, Nocturia and Urine Leakage.  Vitals (Chemira Jones CMA; 09/08/2017 3:23 PM) 09/08/2017  3:23 PM Weight: 184.8 lb Height: 67in Body Surface Area: 1.96 m Body Mass Index: 28.94 kg/m  BP: 120/60 (Sitting, Left Arm, Standard)       Physical Exam (Hailly Fess A.  Ninfa Linden MD; 09/08/2017 3:39 PM) General Mental Status-Alert. General Appearance-Consistent with stated age. Hydration-Well hydrated. Voice-Normal.  Head and Neck Head-normocephalic, atraumatic with no lesions or palpable masses. Trachea-midline.  Eye Eyeball - Bilateral-Extraocular movements intact. Sclera/Conjunctiva - Bilateral-No scleral icterus.  Chest and Lung Exam Chest and lung exam reveals -quiet, even and easy respiratory effort with no use of accessory muscles and on auscultation, normal breath sounds, no adventitious sounds and normal vocal resonance. Inspection Chest Wall - Normal. Back - normal.  Cardiovascular Cardiovascular examination reveals -normal heart sounds, regular rate and rhythm with no murmurs and normal pedal pulses bilaterally. Note: There is a large amount of peripheral edema in his lower extremities   Abdomen Inspection Skin - Scar - no surgical scars. Hernias - Inguinal hernia - Left - Reducible. Note: This is a very large left inguinal hernia which I was able to reduce completely but it immediately bulged back out when he stood up. Palpation/Percussion Palpation and Percussion of the abdomen reveal - Soft, Non Tender, No Rebound tenderness, No Rigidity (guarding) and No hepatosplenomegaly. Auscultation Auscultation of the abdomen reveals - Bowel sounds normal.  Neurologic - Did not examine.  Musculoskeletal Normal Exam - Left-Upper Extremity Strength Normal and Lower Extremity Strength Normal. Normal Exam - Right-Upper Extremity Strength Normal, Lower Extremity Weakness.    Assessment & Plan ([name] A. Ninfa Linden MD; 09/08/2017 3:40 PM) LEFT INGUINAL HERNIA (K40.90) Impression: I discussed the diagnosis of inguinal hernia with him in  detail. This is quite a large hernia containing a moderate amount of small bowel. I am worried about his potential risk of incarceration and circulation with the need for emergent surgery. He is very interested in having surgery as well given his symptoms. I would recommend an open left inguinal hernia repair with mesh. I discussed the surgical procedure with him in detail. I discussed the risk which includes but is not limited to bleeding, infection, injury to surrounding structures, hernia recurrence, use of mesh, cardiopulmonary issues, postoperative recovery, etc. He understands and wishes to proceed with surgery which will be scheduled

## 2017-09-15 ENCOUNTER — Ambulatory Visit (HOSPITAL_COMMUNITY): Admission: RE | Admit: 2017-09-15 | Payer: Medicare HMO | Source: Ambulatory Visit | Admitting: Surgery

## 2017-09-15 HISTORY — DX: Anemia, unspecified: D64.9

## 2017-09-15 HISTORY — DX: Localized edema: R60.0

## 2017-09-15 SURGERY — REPAIR, HERNIA, INGUINAL, ADULT
Anesthesia: General | Laterality: Left

## 2017-09-23 MED FILL — IMBRUVICA 140 MG CAPSULE: 140 | 30 days supply | Qty: 60 | Fill #6

## 2017-10-01 ENCOUNTER — Telehealth: Payer: Self-pay

## 2017-10-01 NOTE — Telephone Encounter (Signed)
IN basket message sent to Johny Drilling regarding need to re enroll for financial assistance through Patient [employer] for [employer]

## 2017-10-01 NOTE — Telephone Encounter (Signed)
Oral Oncology Patient Advocate Encounter  I called the patient and left him a message to assure him I have him in my calendar to renew his grant on 9/19 as long as there are funds available.   [place] Patient [place] Phone [phone] Fax [phone]

## 2017-10-05 ENCOUNTER — Telehealth: Payer: Self-pay

## 2017-10-05 NOTE — Telephone Encounter (Signed)
Oral Oncology Patient Advocate Encounter  Mr. Michael [name] brought a letter from Patient [facility]) that stated his grant needs to be renewed on 10/14/2017.   I have it down in my calendar to renew his grant on 10/14/17.  I attempted to contact the patient about this but had to leave a message.   [place] Patient [place] Phone [phone] Fax [phone]

## 2017-10-12 NOTE — Telephone Encounter (Signed)
Oral Oncology Patient Advocate Encounter  I was able to re-new the [place] for $7900. The information is as follows and has been shared with [place].   BIN: [id number] Group: [id number] PCN: [id number] ID: [id number]  I spoke to Mr. Baumgarten and he verbalized understanding and great appreciation.  [place] Patient [place] Phone [phone] Fax [phone]

## 2017-10-15 ENCOUNTER — Other Ambulatory Visit: Payer: Self-pay | Admitting: Hematology and Oncology

## 2017-10-15 DIAGNOSIS — R69 Illness, unspecified: Secondary | ICD-10-CM | POA: Diagnosis not present

## 2017-10-21 MED FILL — IMBRUVICA 140 MG CAPSULE: 140 | 30 days supply | Qty: 60 | Fill #0

## 2017-11-02 NOTE — Pre-Procedure Instructions (Signed)
Michael [name]  11/02/2017      [id number] - [place], [place] AT [address] [place] [address] Phone: [phone] Fax: [place], [place] - [place] [address] Phone: [phone] Fax: [phone]    Your procedure is scheduled on Wednesday October 16.  Report to [facility] Admitting at 12:00 A.M.  Call this number if you have problems the morning of surgery:  [phone]   Remember:  Do not eat or drink after midnight.  You may drink clear liquids until 11:00 AM (3 hours prior to surgery) .  Clear liquids allowed are:  Water, Juice (non-citric and without pulp), Carbonated beverages, Black Coffee only and Gatorade    Take these medicines the morning of surgery with A SIP OF WATER:  Imbruvica Pantoprazole (protonix) Morphine (MSIR) if needed Acetaminophen (tylenol) if needed  DO NOT take Diabetic pills the morning of surgery (Metformin-Glucophage)  DO NOT TAKE Novolin 70/30 insulin the morning of surgery  TAKE 70% of Novolin 70/30 insulin the day before surgery (or contact your endocrinologist for instructions)  7 days prior to surgery STOP taking any Aleve, Naproxen, Ibuprofen, Motrin, Advil, Goody's, BC's, all herbal medications, fish oil, and all vitamins  Follow your surgeon's instructions on stopping Aspirin.      How to Manage Your Diabetes Before and After Surgery  Why is it important to control my blood sugar before and after surgery? . Improving blood sugar levels before and after surgery helps healing and can limit problems. . A way of improving blood sugar control is eating a healthy diet by: o  Eating less sugar and carbohydrates o  Increasing activity/exercise o  Talking with your doctor about reaching your blood sugar goals . High blood sugars  (greater than 180 mg/dL) can raise your risk of infections and slow your recovery, so you will need to focus on controlling your diabetes during the weeks before surgery. . Make sure that the doctor who takes care of your diabetes knows about your planned surgery including the date and location.  How do I manage my blood sugar before surgery? . Check your blood sugar at least 4 times a day, starting 2 days before surgery, to make sure that the level is not too high or low. o Check your blood sugar the morning of your surgery when you wake up and every 2 hours until you get to the Short Stay unit. . If your blood sugar is less than 70 mg/dL, you will need to treat for low blood sugar: o Do not take insulin. o Treat a low blood sugar (less than 70 mg/dL) with  cup of clear juice (cranberry or apple), 4 glucose tablets, OR glucose gel. Recheck blood sugar in 15 minutes after treatment (to make sure it is greater than 70 mg/dL). If your blood sugar is not greater than 70 mg/dL on recheck, call [phone] o  for further instructions. . Report your blood sugar to the short stay nurse when you get to Short Stay.  . If you are admitted to the hospital after surgery: o Your blood sugar will be checked by the staff and you will probably be given insulin after surgery (instead of oral diabetes medicines) to make sure you have good blood sugar levels. o The goal for blood sugar control after surgery is 80-180  mg/dL.               Do not wear jewelry, make-up or nail polish.  Do not wear lotions, powders, or perfumes, or deodorant.  Do not shave 48 hours prior to surgery.  Men may shave face and neck.  Do not bring valuables to the hospital.  [facility] is not responsible for any belongings or valuables.  Contacts, dentures or bridgework may not be worn into surgery.  Leave your suitcase in the car.  After surgery it may be brought to your room.  For patients admitted to the hospital,  discharge time will be determined by your treatment team.  Patients discharged the day of surgery will not be allowed to drive home.    Special instructions:    [facility]- Preparing For Surgery  Before surgery, you can play an important role. Because skin is not sterile, your skin needs to be as free of germs as possible. You can reduce the number of germs on your skin by washing with CHG (chlorahexidine gluconate) Soap before surgery.  CHG is an antiseptic cleaner which kills germs and bonds with the skin to continue killing germs even after washing.    Oral Hygiene is also important to reduce your risk of infection.  Remember - BRUSH YOUR TEETH THE MORNING OF SURGERY WITH YOUR REGULAR TOOTHPASTE  Please do not use if you have an allergy to CHG or antibacterial soaps. If your skin becomes reddened/irritated stop using the CHG.  Do not shave (including legs and underarms) for at least 48 hours prior to first CHG shower. It is OK to shave your face.  Please follow these instructions carefully.   1. Shower the NIGHT BEFORE SURGERY and the MORNING OF SURGERY with CHG.   2. If you chose to wash your hair, wash your hair first as usual with your normal shampoo.  3. After you shampoo, rinse your hair and body thoroughly to remove the shampoo.  4. Use CHG as you would any other liquid soap. You can apply CHG directly to the skin and wash gently with a scrungie or a clean washcloth.   5. Apply the CHG Soap to your body ONLY FROM THE NECK DOWN.  Do not use on open wounds or open sores. Avoid contact with your eyes, ears, mouth and genitals (private parts). Wash Face and genitals (private parts)  with your normal soap.  6. Wash thoroughly, paying special attention to the area where your surgery will be performed.  7. Thoroughly rinse your body with warm water from the neck down.  8. DO NOT shower/wash with your normal soap after using and rinsing off the CHG Soap.  9. Pat yourself dry with  a CLEAN TOWEL.  10. Wear CLEAN PAJAMAS to bed the night before surgery, wear comfortable clothes the morning of surgery  11. Place CLEAN SHEETS on your bed the night of your first shower and DO NOT SLEEP WITH PETS.    Day of Surgery:  Do not apply any deodorants/lotions.  Please wear clean clothes to the hospital/surgery center.   Remember to brush your teeth WITH YOUR REGULAR TOOTHPASTE.    Please read over the following fact sheets that you were given. Coughing and Deep Breathing and Surgical Site Infection Prevention

## 2017-11-04 ENCOUNTER — Encounter (HOSPITAL_COMMUNITY): Payer: Self-pay

## 2017-11-04 ENCOUNTER — Other Ambulatory Visit: Payer: Self-pay

## 2017-11-04 ENCOUNTER — Encounter (HOSPITAL_COMMUNITY)
Admission: RE | Admit: 2017-11-04 | Discharge: 2017-11-04 | Disposition: A | Discharge status: Home / Self Care | Payer: Medicare HMO | Source: Ambulatory Visit | Attending: Surgery | Admitting: Surgery

## 2017-11-04 DIAGNOSIS — N3001 Acute cystitis with hematuria: Secondary | ICD-10-CM | POA: Diagnosis not present

## 2017-11-04 DIAGNOSIS — R300 Dysuria: Secondary | ICD-10-CM | POA: Diagnosis not present

## 2017-11-04 DIAGNOSIS — N390 Urinary tract infection, site not specified: Secondary | ICD-10-CM | POA: Diagnosis not present

## 2017-11-04 DIAGNOSIS — K4090 Unilateral inguinal hernia, without obstruction or gangrene, not specified as recurrent: Secondary | ICD-10-CM | POA: Insufficient documentation

## 2017-11-04 DIAGNOSIS — Z01818 Encounter for other preprocedural examination: Secondary | ICD-10-CM | POA: Diagnosis not present

## 2017-11-04 DIAGNOSIS — E119 Type 2 diabetes mellitus without complications: Secondary | ICD-10-CM | POA: Insufficient documentation

## 2017-11-04 HISTORY — DX: Angina pectoris, unspecified: I20.9

## 2017-11-04 LAB — CBC
HEMATOCRIT: 36.1 % — AB (ref 39.0–52.0)
HEMOGLOBIN: 10.3 g/dL — AB (ref 13.0–17.0)
MCH: 25.6 pg — AB (ref 26.0–34.0)
MCHC: 28.5 g/dL — ABNORMAL LOW (ref 30.0–36.0)
MCV: 89.8 fL (ref 80.0–100.0)
Platelets: 296 10*3/uL (ref 150–400)
RBC: 4.02 MIL/uL — AB (ref 4.22–5.81)
RDW: 17 % — ABNORMAL HIGH (ref 11.5–15.5)
WBC: 7.4 10*3/uL (ref 4.0–10.5)
nRBC: 0 % (ref 0.0–0.2)

## 2017-11-04 LAB — BASIC METABOLIC PANEL
ANION GAP: 11 (ref 5–15)
BUN: 19 mg/dL (ref 8–23)
CALCIUM: 8.8 mg/dL — AB (ref 8.9–10.3)
CO2: 21 mmol/L — AB (ref 22–32)
Chloride: 107 mmol/L (ref 98–111)
Creatinine, Ser: 1.06 mg/dL (ref 0.61–1.24)
GFR calc Af Amer: >60 mL/min (ref >60)
GFR calc non Af Amer: >60 mL/min (ref >60)
Glucose, Bld: 155 mg/dL — ABNORMAL HIGH (ref 70–99)
POTASSIUM: 4.5 mmol/L (ref 3.5–5.1)
Sodium: 139 mmol/L (ref 135–145)

## 2017-11-04 LAB — HEMOGLOBIN A1C
HEMOGLOBIN A1C: 7 % — AB (ref 4.8–5.6)
Mean Plasma Glucose: 154.2 mg/dL

## 2017-11-04 LAB — GLUCOSE, CAPILLARY: Glucose-Capillary: 167 mg/dL — ABNORMAL HIGH (ref 70–99)

## 2017-11-04 NOTE — Progress Notes (Addendum)
Anesthesia Chart Review:   Case:  [id number] Date/Time:  11/10/17 1345   Procedures:      LEFT INGUINAL HERNIA REPAIR ERAS PATHWAY (Left )     INSERTION OF MESH (Left )   Anesthesia type:  General   Pre-op diagnosis:  left inguinal hernia   Location:  [facility] OR ROOM 08 / [facility]   Surgeon:  Coralie Keens, MD      DISCUSSION: Patient is an 84 year old male scheduled for the above procedure.   Surgery was initially scheduled in August, but had to be rescheduled due to [place] not being held for 5 days prior to surgery (due to potential bleeding risk). He reports he was instructed to hold Imbruvica and ASA for 5 days prior to surgery.  History includes chronic lymphoblastic leukemia (CLL; diagnosed '00), DM2, never smoker, hiatal hernia, GERD, edema, anemia.  Reported chronic intermittent dry cough that improves with Tylenol Allergy. This morning he reported SOB with mid chest "burning" after walking quickly from hospital entrance to Admitting back to entrance (to retrieve his phone) then to Short Stay. Symptoms improved after a couple minutes of rest. He has had this in the past, but difficult for him to give me a specific timeline--as typically he will just not over-exert himself to avoid the symptoms. He reports it's not the distance that causes chest pain it's when he's walking a further distance at a fast pace. He denied chest pain at rest. He denied any recent syncope. He reports chronic BLE edema (progressively worse over past 10 years). He walks with a cane when out of the house more for balance and right knee arthritis. He denied fever and does not feel sick. He does not think he has had further right sided weakness/paresthesia (since 09/2016 admission), but at 84 year old says he gets all sorts of intermittent aches and pains. PAT EKG showed NSR.  Since patient reported exertional dyspnea with chest "burning" this morning, I called his cardiologist's Dr. Woody Seller' office. Dr. Woody Seller was not in, but I  did speak with Jeri Lager, NP to inquire if patient's symptoms known and previously evaluated. She does confirm that patient was seen last in June 2019 and with known history of angina, but last ischemic testing in 2017 and patient denied recurrence chest pain at that visit. Given patient's recurrent exertional chest pain, CAD risk factors, and surgery plans a preoperative stress test was recommended. I discussed with patient. He is somewhat frustrated about needing more tests, so he was told that he could further discuss with cardiology before making a final decision, but ultimately he agreed to stress test on 11/05/17 at 9:15 AM. ([facility] triage nurse notified.) He was given pre-stress test instructions as provided by staff at [facility]. Patient says he will not take his DM medications prior to the stress test. Discussed that he should seek emergency medical evaluation if he develops unstable chest pain symptoms such as chest pain at rest. He lives alone. An overnight hospital stay is planned.    Chart will be left for follow-up cardiology records and testing results.  ADDENDUM 11/09/17 10:51 AM: Records from [facility] received. Last office visit with Dr. Woody Seller was on 06/28/17. He had not had any further dizziness, near-syncope/syncope, or recurrent chest pain at that time. Leg edema felt due to diabetic neuropathy or venous incompetence. As above, stress test was ordered by Jeri Lager, NP last week after patient had a single episode of exertional chest pain after walking  quickly back and forth down the hospital halls while trying to get his phone from inside his car before the [place] parked his car. The stress test showed normal perfusion, EF, and no significant change since 2017 study. I have updated [facility] triage nurse Armen.   VS: BP (!) 140/56   Pulse 89   Temp 36.8 C   Resp 20   Ht 5\' 6"  (1.676 m)   Wt 88.5 kg   SpO2 100%   BMI 31.49 kg/m  Patient is a pleasant black  male in NAD. No conversational dyspnea. Heart RRR, no murmur. Lungs clear. BLE large, edematous, 2+ (R > L).    PROVIDERS: Jani Gravel, MD is PCP  - Heath Lark, MD is HEM-ONC. Last visit 07/27/17. Of note, he had ordered echo and CT scan to further evaluate BLE edema. Echo showed normal EF with no significant valvular disease and CT showed no new lymphadenopathy causing venous obstruction, so Dr. Clemon Chambers suspected edema may be caused by compression for his large inguinal hernia.  Rosalin Hawking, MD is neurologist. Last visit at [facility] with Evlyn Courier, NP on 01/21/17 for follow-up intermittent right sided weakness/paresthesia without definitive etiology documented (hospitalized 09/2016). Patient thinks may have been related to hypoglycemic event.  Vear Clock, MD is cardiologist [facility]). Records pending.   LABS: Labs reviewed: Acceptable for surgery. (all labs ordered are listed, but only abnormal results are displayed)  Labs Reviewed  GLUCOSE, CAPILLARY - Abnormal; Notable for the following components:      Result Value   Glucose-Capillary 167 (*)    All other components within normal limits  HEMOGLOBIN A1C - Abnormal; Notable for the following components:   Hgb A1c MFr Bld 7.0 (*)    All other components within normal limits  BASIC METABOLIC PANEL - Abnormal; Notable for the following components:   CO2 21 (*)    Glucose, Bld 155 (*)    Calcium 8.8 (*)    All other components within normal limits  CBC - Abnormal; Notable for the following components:   RBC 4.02 (*)    Hemoglobin 10.3 (*)    HCT 36.1 (*)    MCH 25.6 (*)    MCHC 28.5 (*)    RDW 17.0 (*)    All other components within normal limits    IMAGES: CT Chest 04/26/17: IMPRESSION: 1. No lymphadenopathy in the chest, abdomen, or pelvis. 2. Mild circumferential bladder wall thickening. Incomplete distention may contribute to this appearance, but bladder infection is also  consideration. 3. Stable appearance small hepatic lesions. Imaging features most suggestive of benign cavernous hemangiomas. 4. Stable tiny low-density renal lesions compatible with cysts. 5. Large left groin hernia contains numerous small bowel loops without complicating features. 6. Right-sided hydrocele.  CTA head/neck 10/20/16: IMPRESSION: CTA NECK: 1. No hemodynamically significant stenosis or acute vascular process in the neck. CTA HEAD: 1. No emergent large vessel occlusion or significant stenosis. 2. Mild intracranial atherosclerosis. Moderate stenosis LEFT P2, RIGHT M3 segments, LEFT M2 origin. Aortic Atherosclerosis (ICD10-I70.0).   EKG: 11/04/17: NSR   CV: Nuclear stress test 11/05/17 [facility]): Impression: 1. The resting ECG demonstrated NSR and normal resting conduction. Poor R wave progression. Stress EKG is non diagnostic for ischemia as it is a pharmacologic stress using Lexiscan. Stress symptoms included dizziness and stomach pain. 2. Myocardial perfusion imaging is normal.  Overall left ventricular systolic function was normal without regional wall motion abnormalities.  The left ventricular ejection fraction was 62%.  Low risk study.  No significant change from 12/06/2015 study.  Echo 04/19/17: Study Conclusions - Left ventricle: The cavity size was normal. Wall thickness was   increased in a pattern of mild LVH. Systolic function was   vigorous. The estimated ejection fraction was in the range of 65%   to 70%. Wall motion was normal; there were no regional wall   motion abnormalities. Doppler parameters are consistent with   abnormal left ventricular relaxation (grade 1 diastolic   dysfunction). GLS -22.5%, normal. - Aortic valve: There was no stenosis. - Mitral valve: There was trivial regurgitation. - Right ventricle: The cavity size was normal. Systolic function   was normal. - Atrial septum: Cannot rule out small PFO but not definite. -  Tricuspid valve: Peak RV-RA gradient (S): 16 mm Hg. - Pulmonary arteries: PA peak pressure: 19 mm Hg (S). - Inferior vena cava: The vessel was normal in size. The   respirophasic diameter changes were in the normal range (>= 50%),   consistent with normal central venous pressure. Impressions: - Normal LV size with mild LV hypertrophy. EF 65-70%. Normal RV   size and systolic function. No significant valvular   abnormalities.   Past Medical History:  Diagnosis Date  . Anemia   . Anginal pain (HCC)    upon exertion  . Arthritis    "left shoulder, lower back" (10/20/2016)  . CLL (chronic lymphoblastic leukemia) dx'd 2000  . Edema leg   . Family history of adverse reaction to anesthesia    "daughter's BP drops"   . GERD (gastroesophageal reflux disease)   . History of hiatal hernia   . Leukemia, chronic lymphoid ([place]) 12/08/2010  . Lymphocytosis   . Type II diabetes mellitus ([place])     Past Surgical History:  Procedure Laterality Date  . CATARACT EXTRACTION W/ INTRAOCULAR LENS  IMPLANT, BILATERAL Bilateral   . EYE SURGERY     Cataracts (bilateral)    MEDICATIONS: . acetaminophen (TYLENOL) 650 MG CR tablet  . aspirin EC 81 MG tablet  . Cholecalciferol (VITAMIN D3) 1000 units CAPS  . docusate sodium (COLACE) 50 MG capsule  . IMBRUVICA 140 MG capsul  . insulin NPH-insulin regular (NOVOLIN 70/30) (70-30) 100 UNIT/ML injection  . metFORMIN (GLUCOPHAGE) 1000 MG tablet  . morphine (MSIR) 15 MG tablet  . Multiple Vitamin (MULTIVITAMIN WITH MINERALS) TABS tablet  . pantoprazole (PROTONIX) 40 MG tablet  . Polyethyl Glycol-Propyl Glycol (SYSTANE OP)  . ramipril (ALTACE) 5 MG capsule  . Tamsulosin HCl (FLOMAX) 0.4 MG CAPS  . torsemide (DEMADEX) 20 MG tablet  . vitamin B-12 (CYANOCOBALAMIN) 1000 MCG tablet   No current facility-administered medications for this encounter.     George Hugh [facility] Short Stay Center/Anesthesiology Phone [phone] 11/04/2017 11:26  AM

## 2017-11-04 NOTE — Pre-Procedure Instructions (Addendum)
Michael [name]  11/04/2017      [id number] - [place], [place] - [place] AT [address] [place] [address] Phone: [phone] Fax: [phone]  [place], [place] - [place] [address] Phone: [phone] Fax: [phone]    Your procedure is scheduled on Wednesday October 16.  Report to [facility] Admitting at 12:00 A.M.  Call this number if you have problems the morning of surgery:  [phone]   Remember:  Do not eat or drink after midnight.  You may drink clear liquids until 11:00 AM (3 hours prior to surgery) .   Clear liquids allowed are:  Water, Juice (non-citric and without pulp), Carbonated beverages, Black Coffee only and Gatorade    Take these medicines the morning of surgery with A SIP OF WATER:   Pantoprazole (protonix)  Morphine (MSIR) if needed  Acetaminophen (tylenol) if needed  DO NOT take Diabetic pills the morning of surgery (Metformin-Glucophage)  DO NOT TAKE Novolin 70/30 insulin the morning of surgery  TAKE 70% of Novolin 70/30 insulin the day before surgery (or contact your endocrinologist for instructions)  7 days prior to surgery STOP taking any Aleve, Naproxen, Ibuprofen, Motrin, Advil, Goody's, BC's, all herbal medications, fish oil, and all vitamins  Follow your surgeon's instructions on stopping Aspirin.      How to Manage Your Diabetes Before and After Surgery  Why is it important to control my blood sugar before and after surgery? . Improving blood sugar levels before and after surgery helps healing and can limit problems. . A way of improving blood sugar control is eating a healthy diet by: o  Eating less sugar and carbohydrates o  Increasing activity/exercise o  Talking with your doctor about reaching your blood sugar goals . High blood sugars (greater  than 180 mg/dL) can raise your risk of infections and slow your recovery, so you will need to focus on controlling your diabetes during the weeks before surgery. . Make sure that the doctor who takes care of your diabetes knows about your planned surgery including the date and location.  How do I manage my blood sugar before surgery? . Check your blood sugar at least 4 times a day, starting 2 days before surgery, to make sure that the level is not too high or low. o Check your blood sugar the morning of your surgery when you wake up and every 2 hours until you get to the Short Stay unit. . If your blood sugar is less than 70 mg/dL, you will need to treat for low blood sugar: o Do not take insulin. o Treat a low blood sugar (less than 70 mg/dL) with  cup of clear juice (cranberry or apple), 4 glucose tablets, OR glucose gel. Recheck blood sugar in 15 minutes after treatment (to make sure it is greater than 70 mg/dL). If your blood sugar is not greater than 70 mg/dL on recheck, call [phone] o  for further instructions. . Report your blood sugar to the short stay nurse when you get to Short Stay.  . If you are admitted to the hospital after surgery: o Your blood sugar will be checked by the staff and you will probably be given insulin after surgery (instead of oral diabetes medicines) to make sure you have good blood sugar levels. o The goal for blood sugar control after  surgery is 80-180 mg/dL.      Do not wear jewelry, make-up or nail polish.  Do not wear lotions, powders, or perfumes, or deodorant.  Do not shave 48 hours prior to surgery.  Men may shave face and neck.  Do not bring valuables to the hospital.  [facility] is not responsible for any belongings or valuables.  Contacts, dentures or bridgework may not be worn into surgery.  Leave your suitcase in the car.  After surgery it may be brought to your room.  For patients admitted to the hospital, discharge time will be  determined by your treatment team.  Patients discharged the day of surgery will not be allowed to drive home.    Special instructions:    [facility]- Preparing For Surgery  Before surgery, you can play an important role. Because skin is not sterile, your skin needs to be as free of germs as possible. You can reduce the number of germs on your skin by washing with CHG (chlorahexidine gluconate) Soap before surgery.  CHG is an antiseptic cleaner which kills germs and bonds with the skin to continue killing germs even after washing.    Oral Hygiene is also important to reduce your risk of infection.  Remember - BRUSH YOUR TEETH THE MORNING OF SURGERY WITH YOUR REGULAR TOOTHPASTE  Please do not use if you have an allergy to CHG or antibacterial soaps. If your skin becomes reddened/irritated stop using the CHG.  Do not shave (including legs and underarms) for at least 48 hours prior to first CHG shower. It is OK to shave your face.  Please follow these instructions carefully.   1. Shower the NIGHT BEFORE SURGERY and the MORNING OF SURGERY with CHG.   2. If you chose to wash your hair, wash your hair first as usual with your normal shampoo.  3. After you shampoo, rinse your hair and body thoroughly to remove the shampoo.  4. Use CHG as you would any other liquid soap. You can apply CHG directly to the skin and wash gently with a scrungie or a clean washcloth.   5. Apply the CHG Soap to your body ONLY FROM THE NECK DOWN.  Do not use on open wounds or open sores. Avoid contact with your eyes, ears, mouth and genitals (private parts). Wash Face and genitals (private parts)  with your normal soap.  6. Wash thoroughly, paying special attention to the area where your surgery will be performed.  7. Thoroughly rinse your body with warm water from the neck down.  8. DO NOT shower/wash with your normal soap after using and rinsing off the CHG Soap.  9. Pat yourself dry with a CLEAN  TOWEL.  10. Wear CLEAN PAJAMAS to bed the night before surgery, wear comfortable clothes the morning of surgery  11. Place CLEAN SHEETS on your bed the night of your first shower and DO NOT SLEEP WITH PETS.    Day of Surgery:  Do not apply any deodorants/lotions.  Please wear clean clothes to the hospital/surgery center.   Remember to brush your teeth WITH YOUR REGULAR TOOTHPASTE.    Please read over the following fact sheets that you were given. Coughing and Deep Breathing and Surgical Site Infection Prevention

## 2017-11-04 NOTE — Progress Notes (Signed)
PCP: Jani Gravel  DM: Type II Fasting Sugars: 90-110s PCP handles diabetes  Stress Test: >5 years  Pt states he has chest pain upon exertion, and has occasional cough. Denies fever and SOB. Ebony Hail notified and coming to speak with pt during PAT appointment.  Cardiologist: doesn't recall who it is  Pt states understanding of instructions for day of surgery.

## 2017-11-05 DIAGNOSIS — I200 Unstable angina: Secondary | ICD-10-CM | POA: Diagnosis not present

## 2017-11-09 NOTE — H&P (Signed)
Jillyn Hidden Documented: 09/08/2017 3:22 PM Location: Colon Surgery Patient #: [id number] DOB: 1930/04/28 Widowed / Language: Cleophus Molt / Race: Black or African American Male   History of Present Illness Nathaneil Canary A. Ninfa Linden MD; 09/08/2017 3:38 PM) The patient is a 84 year old male who presents with an inguinal hernia. This is a pleasant gentleman referred by Dr. Jani Gravel for evaluation of a large left inguinal hernia. He has a significant history of CLL. He is seen by oncology for this. He has had a large left inguinal hernia for several years. It is now getting much larger. A CT scan shows a moderate amount of small bowel in the left inguinal hernia. He currently has had no nausea or vomiting. He reports discomfort daily which is mild.   Past Surgical History Malachi Bonds, CMA; 09/08/2017 3:23 PM) Cataract Surgery  Bilateral. Colon Polyp Removal - Colonoscopy   Diagnostic Studies History Malachi Bonds, CMA; 09/08/2017 3:23 PM) Colonoscopy  within last year  Allergies Malachi Bonds, CMA; 09/08/2017 3:23 PM) No Known Drug Allergies [09/08/2017]:  Medication History Malachi Bonds, CMA; 09/08/2017 3:25 PM) Kate Sable (140MG  Capsule, Oral) Active. NovoLIN 70/30 ((70-30) 100UNIT/ML Suspension, Subcutaneous) Active. MetFORMIN HCl (1000MG  Tablet, Oral) Active. Pantoprazole Sodium (40MG  Tablet DR, Oral) Active. Morphine Sulfate (15MG  Tablet, Oral) Active. Ramipril (5MG  Capsule, Oral) Active. Torsemide (20MG  Tablet, Oral) Active. Tamsulosin HCl (0.4MG  Capsule, Oral) Active. Multivitamin Adult (Oral) Active. Aspirin (81MG  Tablet, Oral) Active. Medications Reconciled  Social History Malachi Bonds, CMA; 09/08/2017 3:23 PM) Alcohol use  Remotely quit alcohol use. Caffeine use  Carbonated beverages, Coffee, Tea. No drug use   Family History Malachi Bonds, CMA; 09/08/2017 3:23 PM) Arthritis  Mother. Bleeding disorder  Mother. Colon Polyps   Daughter. Depression  Daughter. Diabetes Mellitus  Daughter, Mother. Heart Disease  Daughter. Hypertension  Daughter, Mother, Son. Melanoma  Mother. Respiratory Condition  Daughter. Seizure disorder  Daughter. Thyroid problems  Daughter, Son.  Other Problems Malachi Bonds, CMA; 09/08/2017 3:23 PM) Arthritis  Back Pain  Chest pain  Congestive Heart Failure  Diabetes Mellitus  Enlarged Prostate  Gastroesophageal Reflux Disease  Hemorrhoids  High blood pressure  Inguinal Hernia  Prostate Cancer  Seizure Disorder     Review of Systems Malachi Bonds CMA; 09/08/2017 3:23 PM) General Present- Appetite Loss and Fatigue. Not Present- Chills, Fever, Night Sweats, Weight Gain and Weight Loss. Skin Present- Dryness. Not Present- Change in Wart/Mole, Hives, Jaundice, New Lesions, Non-Healing Wounds, Rash and Ulcer. HEENT Present- Seasonal Allergies, Sinus Pain and Wears glasses/contact lenses. Not Present- Earache, Hearing Loss, Hoarseness, Nose Bleed, Oral Ulcers, Ringing in the Ears, Sore Throat, Visual Disturbances and Yellow Eyes. Respiratory Present- Difficulty Breathing. Not Present- Bloody sputum, Chronic Cough, Snoring and Wheezing. Breast Not Present- Breast Mass, Breast Pain, Nipple Discharge and Skin Changes. Cardiovascular Present- Chest Pain, Leg Cramps, Rapid Heart Rate, Shortness of Breath and Swelling of Extremities. Not Present- Difficulty Breathing Lying Down and Palpitations. Gastrointestinal Present- Abdominal Pain, Bloating, Constipation, Excessive gas, Gets full quickly at meals and Indigestion. Not Present- Bloody Stool, Change in Bowel Habits, Chronic diarrhea, Difficulty Swallowing, Hemorrhoids, Nausea, Rectal Pain and Vomiting. Male Genitourinary Present- Impotence, Painful Urination and Urgency. Not Present- Blood in Urine, Change in Urinary Stream, Frequency, Nocturia and Urine Leakage.  Vitals (Chemira Jones CMA; 09/08/2017 3:23 PM) 09/08/2017  3:23 PM Weight: 184.8 lb Height: 67in Body Surface Area: 1.96 m Body Mass Index: 28.94 kg/m  BP: 120/60 (Sitting, Left Arm, Standard)       Physical Exam Nathaneil Canary  A. Ninfa Linden MD; 09/08/2017 3:39 PM) General Mental Status-Alert. General Appearance-Consistent with stated age. Hydration-Well hydrated. Voice-Normal.  Head and Neck Head-normocephalic, atraumatic with no lesions or palpable masses. Trachea-midline.  Eye Eyeball - Bilateral-Extraocular movements intact. Sclera/Conjunctiva - Bilateral-No scleral icterus.  Chest and Lung Exam Chest and lung exam reveals -quiet, even and easy respiratory effort with no use of accessory muscles and on auscultation, normal breath sounds, no adventitious sounds and normal vocal resonance. Inspection Chest Wall - Normal. Back - normal.  Cardiovascular Cardiovascular examination reveals -normal heart sounds, regular rate and rhythm with no murmurs and normal pedal pulses bilaterally. Note: There is a large amount of peripheral edema in his lower extremities   Abdomen Inspection Skin - Scar - no surgical scars. Hernias - Inguinal hernia - Left - Reducible. Note: This is a very large left inguinal hernia which I was able to reduce completely but it immediately bulged back out when he stood up. Palpation/Percussion Palpation and Percussion of the abdomen reveal - Soft, Non Tender, No Rebound tenderness, No Rigidity (guarding) and No hepatosplenomegaly. Auscultation Auscultation of the abdomen reveals - Bowel sounds normal.  Neurologic - Did not examine.  Musculoskeletal Normal Exam - Left-Upper Extremity Strength Normal and Lower Extremity Strength Normal. Normal Exam - Right-Upper Extremity Strength Normal, Lower Extremity Weakness.    Assessment & Plan (Shanequia Kendrick A. Ninfa Linden MD; 09/08/2017 3:40 PM) LEFT INGUINAL HERNIA (K40.90) Impression: I discussed the diagnosis of inguinal hernia with him in  detail. This is quite a large hernia containing a moderate amount of small bowel. I am worried about his potential risk of incarceration and circulation with the need for emergent surgery. He is very interested in having surgery as well given his symptoms. I would recommend an open left inguinal hernia repair with mesh. I discussed the surgical procedure with him in detail. I discussed the risk which includes but is not limited to bleeding, infection, injury to surrounding structures, hernia recurrence, use of mesh, cardiopulmonary issues, postoperative recovery, etc. He understands and wishes to proceed with surgery which will be scheduled

## 2017-11-10 ENCOUNTER — Encounter (HOSPITAL_COMMUNITY): Payer: Self-pay

## 2017-11-10 ENCOUNTER — Encounter (HOSPITAL_COMMUNITY)
Admission: AD | Disposition: A | Discharge status: Skilled Nursing Facility | Payer: Self-pay | Source: Home / Self Care | Attending: Surgery

## 2017-11-10 ENCOUNTER — Inpatient Hospital Stay (HOSPITAL_COMMUNITY)
Admission: AD | Admit: 2017-11-10 | Discharge: 2017-12-05 | DRG: 329 | Disposition: A | Discharge status: Skilled Nursing Facility | Payer: Medicare HMO | Attending: Surgery | Admitting: Surgery

## 2017-11-10 ENCOUNTER — Ambulatory Visit (HOSPITAL_COMMUNITY): Payer: Medicare HMO | Admitting: Vascular Surgery

## 2017-11-10 ENCOUNTER — Ambulatory Visit (HOSPITAL_COMMUNITY): Payer: Medicare HMO | Admitting: Anesthesiology

## 2017-11-10 DIAGNOSIS — K4031 Unilateral inguinal hernia, with obstruction, without gangrene, recurrent: Secondary | ICD-10-CM | POA: Diagnosis not present

## 2017-11-10 DIAGNOSIS — K219 Gastro-esophageal reflux disease without esophagitis: Secondary | ICD-10-CM | POA: Diagnosis present

## 2017-11-10 DIAGNOSIS — Z9842 Cataract extraction status, left eye: Secondary | ICD-10-CM

## 2017-11-10 DIAGNOSIS — Z808 Family history of malignant neoplasm of other organs or systems: Secondary | ICD-10-CM

## 2017-11-10 DIAGNOSIS — E440 Moderate protein-calorie malnutrition: Secondary | ICD-10-CM | POA: Diagnosis not present

## 2017-11-10 DIAGNOSIS — Z833 Family history of diabetes mellitus: Secondary | ICD-10-CM

## 2017-11-10 DIAGNOSIS — Z961 Presence of intraocular lens: Secondary | ICD-10-CM | POA: Diagnosis present

## 2017-11-10 DIAGNOSIS — Z4659 Encounter for fitting and adjustment of other gastrointestinal appliance and device: Secondary | ICD-10-CM

## 2017-11-10 DIAGNOSIS — D729 Disorder of white blood cells, unspecified: Secondary | ICD-10-CM

## 2017-11-10 DIAGNOSIS — E870 Hyperosmolality and hypernatremia: Secondary | ICD-10-CM | POA: Diagnosis not present

## 2017-11-10 DIAGNOSIS — Z79899 Other long term (current) drug therapy: Secondary | ICD-10-CM

## 2017-11-10 DIAGNOSIS — Z8249 Family history of ischemic heart disease and other diseases of the circulatory system: Secondary | ICD-10-CM

## 2017-11-10 DIAGNOSIS — K559 Vascular disorder of intestine, unspecified: Secondary | ICD-10-CM | POA: Diagnosis not present

## 2017-11-10 DIAGNOSIS — N170 Acute kidney failure with tubular necrosis: Secondary | ICD-10-CM | POA: Diagnosis not present

## 2017-11-10 DIAGNOSIS — K567 Ileus, unspecified: Secondary | ICD-10-CM | POA: Diagnosis not present

## 2017-11-10 DIAGNOSIS — C9110 Chronic lymphocytic leukemia of B-cell type not having achieved remission: Secondary | ICD-10-CM | POA: Diagnosis present

## 2017-11-10 DIAGNOSIS — M19012 Primary osteoarthritis, left shoulder: Secondary | ICD-10-CM | POA: Diagnosis present

## 2017-11-10 DIAGNOSIS — Z7982 Long term (current) use of aspirin: Secondary | ICD-10-CM

## 2017-11-10 DIAGNOSIS — I10 Essential (primary) hypertension: Secondary | ICD-10-CM | POA: Diagnosis present

## 2017-11-10 DIAGNOSIS — Z9889 Other specified postprocedural states: Secondary | ICD-10-CM

## 2017-11-10 DIAGNOSIS — K30 Functional dyspepsia: Secondary | ICD-10-CM | POA: Diagnosis not present

## 2017-11-10 DIAGNOSIS — K4090 Unilateral inguinal hernia, without obstruction or gangrene, not specified as recurrent: Secondary | ICD-10-CM | POA: Diagnosis not present

## 2017-11-10 DIAGNOSIS — Z978 Presence of other specified devices: Secondary | ICD-10-CM

## 2017-11-10 DIAGNOSIS — L8990 Pressure ulcer of unspecified site, unspecified stage: Secondary | ICD-10-CM

## 2017-11-10 DIAGNOSIS — E119 Type 2 diabetes mellitus without complications: Secondary | ICD-10-CM | POA: Diagnosis present

## 2017-11-10 DIAGNOSIS — T8149XA Infection following a procedure, other surgical site, initial encounter: Secondary | ICD-10-CM | POA: Diagnosis not present

## 2017-11-10 DIAGNOSIS — Z87891 Personal history of nicotine dependence: Secondary | ICD-10-CM

## 2017-11-10 DIAGNOSIS — M4690 Unspecified inflammatory spondylopathy, site unspecified: Secondary | ICD-10-CM | POA: Diagnosis present

## 2017-11-10 DIAGNOSIS — R140 Abdominal distension (gaseous): Secondary | ICD-10-CM

## 2017-11-10 DIAGNOSIS — Z8042 Family history of malignant neoplasm of prostate: Secondary | ICD-10-CM

## 2017-11-10 DIAGNOSIS — G8918 Other acute postprocedural pain: Secondary | ICD-10-CM | POA: Diagnosis not present

## 2017-11-10 DIAGNOSIS — L7634 Postprocedural seroma of skin and subcutaneous tissue following other procedure: Secondary | ICD-10-CM | POA: Diagnosis not present

## 2017-11-10 DIAGNOSIS — Z794 Long term (current) use of insulin: Secondary | ICD-10-CM

## 2017-11-10 DIAGNOSIS — Z8719 Personal history of other diseases of the digestive system: Secondary | ICD-10-CM

## 2017-11-10 DIAGNOSIS — Z9841 Cataract extraction status, right eye: Secondary | ICD-10-CM

## 2017-11-10 DIAGNOSIS — Y838 Other surgical procedures as the cause of abnormal reaction of the patient, or of later complication, without mention of misadventure at the time of the procedure: Secondary | ICD-10-CM | POA: Diagnosis not present

## 2017-11-10 DIAGNOSIS — Z6827 Body mass index (BMI) 27.0-27.9, adult: Secondary | ICD-10-CM

## 2017-11-10 DIAGNOSIS — Z8371 Family history of colonic polyps: Secondary | ICD-10-CM

## 2017-11-10 DIAGNOSIS — Z452 Encounter for adjustment and management of vascular access device: Secondary | ICD-10-CM | POA: Diagnosis not present

## 2017-11-10 DIAGNOSIS — D638 Anemia in other chronic diseases classified elsewhere: Secondary | ICD-10-CM | POA: Diagnosis present

## 2017-11-10 DIAGNOSIS — Z8261 Family history of arthritis: Secondary | ICD-10-CM

## 2017-11-10 HISTORY — PX: INSERTION OF MESH: SHX5868

## 2017-11-10 HISTORY — PX: INGUINAL HERNIA REPAIR: SHX194

## 2017-11-10 LAB — GLUCOSE, CAPILLARY
GLUCOSE-CAPILLARY: 90 mg/dL (ref 70–99)
GLUCOSE-CAPILLARY: 124 mg/dL — AB (ref 70–99)
GLUCOSE-CAPILLARY: 151 mg/dL — AB (ref 70–99)
Glucose-Capillary: 144 mg/dL — ABNORMAL HIGH (ref 70–99)
Glucose-Capillary: 165 mg/dL — ABNORMAL HIGH (ref 70–99)

## 2017-11-10 SURGERY — REPAIR, HERNIA, INGUINAL, ADULT
Anesthesia: General | Site: Abdomen | Laterality: Left

## 2017-11-10 MED ORDER — DIPHENHYDRAMINE HCL 12.5 MG/5ML PO ELIX
12.5000 mg | ORAL_SOLUTION | Freq: Four times a day (QID) | ORAL | Status: DC | PRN
  Administered 2017-11-28: 12.5 mg via ORAL
  Filled 2017-11-10: qty 10

## 2017-11-10 MED ORDER — FENTANYL CITRATE (PF) 250 MCG/5ML IJ SOLN
INTRAMUSCULAR | Status: AC
  Filled 2017-11-10: qty 5

## 2017-11-10 MED ORDER — MIDAZOLAM HCL 2 MG/2ML IJ SOLN
INTRAMUSCULAR | Status: AC
  Filled 2017-11-10: qty 2

## 2017-11-10 MED ORDER — FENTANYL CITRATE (PF) 100 MCG/2ML IJ SOLN
50.0000 ug | Freq: Once | INTRAMUSCULAR | Status: AC
  Administered 2017-11-10: 50 ug via INTRAVENOUS

## 2017-11-10 MED ORDER — PROPOFOL 10 MG/ML IV BOLUS
INTRAVENOUS | Status: DC | PRN
  Administered 2017-11-10: 110 mg via INTRAVENOUS

## 2017-11-10 MED ORDER — LACTATED RINGERS IV SOLN
INTRAVENOUS | Status: DC
  Administered 2017-11-10: 13:00:00 via INTRAVENOUS

## 2017-11-10 MED ORDER — LIDOCAINE 2% (20 MG/ML) 5 ML SYRINGE
INTRAMUSCULAR | Status: DC | PRN
  Administered 2017-11-10: 60 mg via INTRAVENOUS

## 2017-11-10 MED ORDER — FENTANYL CITRATE (PF) 100 MCG/2ML IJ SOLN
INTRAMUSCULAR | Status: AC
  Administered 2017-11-10: 50 ug via INTRAVENOUS
  Filled 2017-11-10: qty 2

## 2017-11-10 MED ORDER — PROPOFOL 10 MG/ML IV BOLUS
INTRAVENOUS | Status: AC
  Filled 2017-11-10: qty 20

## 2017-11-10 MED ORDER — TRAMADOL HCL 50 MG PO TABS
ORAL_TABLET | ORAL | Status: AC
  Filled 2017-11-10: qty 1

## 2017-11-10 MED ORDER — CEFAZOLIN SODIUM-DEXTROSE 2-4 GM/100ML-% IV SOLN
2.0000 g | INTRAVENOUS | Status: AC
  Administered 2017-11-10: 2 g via INTRAVENOUS

## 2017-11-10 MED ORDER — CHLORHEXIDINE GLUCONATE CLOTH 2 % EX PADS: 6.0000 | MEDICATED_PAD | Freq: Once | CUTANEOUS | Status: DC

## 2017-11-10 MED ORDER — CEFAZOLIN SODIUM-DEXTROSE 2-4 GM/100ML-% IV SOLN
INTRAVENOUS | Status: AC
  Filled 2017-11-10: qty 100

## 2017-11-10 MED ORDER — SUGAMMADEX SODIUM 200 MG/2ML IV SOLN
INTRAVENOUS | Status: DC | PRN
  Administered 2017-11-10: 200 mg via INTRAVENOUS

## 2017-11-10 MED ORDER — LIDOCAINE 2% (20 MG/ML) 5 ML SYRINGE
INTRAMUSCULAR | Status: AC
  Filled 2017-11-10: qty 5

## 2017-11-10 MED ORDER — 0.9 % SODIUM CHLORIDE (POUR BTL) OPTIME
TOPICAL | Status: DC | PRN
  Administered 2017-11-10: 1000 mL

## 2017-11-10 MED ORDER — OXYCODONE HCL 5 MG PO TABS
5.0000 mg | ORAL_TABLET | ORAL | Status: DC | PRN
  Administered 2017-11-10: 5 mg via ORAL
  Administered 2017-11-11 – 2017-11-18 (×12): 10 mg via ORAL
  Administered 2017-11-19: 5 mg via ORAL
  Administered 2017-11-20: 10 mg via ORAL
  Administered 2017-11-20 (×2): 5 mg via ORAL
  Administered 2017-11-21: 10 mg via ORAL
  Administered 2017-11-21: 5 mg via ORAL
  Administered 2017-11-22 – 2017-11-26 (×6): 10 mg via ORAL
  Administered 2017-11-27: 5 mg via ORAL
  Administered 2017-11-27 – 2017-11-28 (×2): 10 mg via ORAL
  Administered 2017-11-28 (×2): 5 mg via ORAL
  Administered 2017-11-29 – 2017-12-03 (×17): 10 mg via ORAL
  Administered 2017-12-04 (×2): 5 mg via ORAL
  Administered 2017-12-04 – 2017-12-05 (×2): 10 mg via ORAL
  Filled 2017-11-10 (×6): qty 2
  Filled 2017-11-10: qty 1
  Filled 2017-11-10 (×11): qty 2
  Filled 2017-11-10 (×2): qty 1
  Filled 2017-11-10: qty 2
  Filled 2017-11-10 (×2): qty 1
  Filled 2017-11-10 (×5): qty 2
  Filled 2017-11-10 (×2): qty 1
  Filled 2017-11-10 (×2): qty 2
  Filled 2017-11-10: qty 1
  Filled 2017-11-10: qty 2
  Filled 2017-11-10: qty 1
  Filled 2017-11-10 (×17): qty 2
  Filled 2017-11-10: qty 1

## 2017-11-10 MED ORDER — INSULIN ASPART 100 UNIT/ML ~~LOC~~ SOLN
0.0000 [IU] | Freq: Every day | SUBCUTANEOUS | Status: DC
  Administered 2017-11-12: 3 [IU] via SUBCUTANEOUS
  Administered 2017-11-13 – 2017-11-14 (×2): 2 [IU] via SUBCUTANEOUS

## 2017-11-10 MED ORDER — BUPIVACAINE-EPINEPHRINE (PF) 0.5% -1:200000 IJ SOLN
INTRAMUSCULAR | Status: DC | PRN
  Administered 2017-11-10: 30 mL

## 2017-11-10 MED ORDER — DEXAMETHASONE SODIUM PHOSPHATE 10 MG/ML IJ SOLN
INTRAMUSCULAR | Status: AC
  Filled 2017-11-10: qty 1

## 2017-11-10 MED ORDER — ONDANSETRON 4 MG PO TBDP: 4.0000 mg | ORAL_TABLET | Freq: Four times a day (QID) | ORAL | Status: DC | PRN

## 2017-11-10 MED ORDER — ENOXAPARIN SODIUM 40 MG/0.4ML ~~LOC~~ SOLN: 40.0000 mg | SUBCUTANEOUS | Status: DC

## 2017-11-10 MED ORDER — GABAPENTIN 300 MG PO CAPS
300.0000 mg | ORAL_CAPSULE | ORAL | Status: AC
  Administered 2017-11-10: 300 mg via ORAL

## 2017-11-10 MED ORDER — ACETAMINOPHEN 500 MG PO TABS
ORAL_TABLET | ORAL | Status: AC
  Administered 2017-11-10: 1000 mg via ORAL
  Filled 2017-11-10: qty 2

## 2017-11-10 MED ORDER — WHITE PETROLATUM EX OINT
TOPICAL_OINTMENT | CUTANEOUS | Status: AC
  Administered 2017-11-11: 01:00:00
  Filled 2017-11-10: qty 28.35

## 2017-11-10 MED ORDER — TRAMADOL HCL 50 MG PO TABS
50.0000 mg | ORAL_TABLET | Freq: Four times a day (QID) | ORAL | Status: DC | PRN
  Administered 2017-11-10 – 2017-12-02 (×11): 50 mg via ORAL
  Filled 2017-11-10 (×10): qty 1

## 2017-11-10 MED ORDER — ROCURONIUM BROMIDE 10 MG/ML (PF) SYRINGE
PREFILLED_SYRINGE | INTRAVENOUS | Status: DC | PRN
  Administered 2017-11-10: 50 mg via INTRAVENOUS

## 2017-11-10 MED ORDER — ONDANSETRON HCL 4 MG/2ML IJ SOLN
4.0000 mg | Freq: Four times a day (QID) | INTRAMUSCULAR | Status: DC | PRN
  Administered 2017-11-14 – 2017-11-28 (×4): 4 mg via INTRAVENOUS
  Filled 2017-11-10 (×4): qty 2

## 2017-11-10 MED ORDER — ONDANSETRON HCL 4 MG/2ML IJ SOLN
INTRAMUSCULAR | Status: AC
  Filled 2017-11-10: qty 2

## 2017-11-10 MED ORDER — FENTANYL CITRATE (PF) 250 MCG/5ML IJ SOLN
INTRAMUSCULAR | Status: DC | PRN
  Administered 2017-11-10 (×2): 50 ug via INTRAVENOUS

## 2017-11-10 MED ORDER — DEXAMETHASONE SODIUM PHOSPHATE 10 MG/ML IJ SOLN
INTRAMUSCULAR | Status: DC | PRN
  Administered 2017-11-10: 10 mg via INTRAVENOUS

## 2017-11-10 MED ORDER — POTASSIUM CHLORIDE IN NACL 20-0.9 MEQ/L-% IV SOLN
INTRAVENOUS | Status: DC
  Administered 2017-11-10 – 2017-11-15 (×7): via INTRAVENOUS
  Filled 2017-11-10 (×9): qty 1000

## 2017-11-10 MED ORDER — BUPIVACAINE-EPINEPHRINE 0.5% -1:200000 IJ SOLN
INTRAMUSCULAR | Status: DC | PRN
  Administered 2017-11-10: 10 mL

## 2017-11-10 MED ORDER — INSULIN ASPART 100 UNIT/ML ~~LOC~~ SOLN
0.0000 [IU] | Freq: Three times a day (TID) | SUBCUTANEOUS | Status: DC
  Administered 2017-11-10: 3 [IU] via SUBCUTANEOUS
  Administered 2017-11-11 (×2): 5 [IU] via SUBCUTANEOUS
  Administered 2017-11-11: 3 [IU] via SUBCUTANEOUS
  Administered 2017-11-12: 5 [IU] via SUBCUTANEOUS
  Administered 2017-11-12 (×2): 3 [IU] via SUBCUTANEOUS
  Administered 2017-11-13: 8 [IU] via SUBCUTANEOUS
  Administered 2017-11-13: 5 [IU] via SUBCUTANEOUS
  Administered 2017-11-14: 8 [IU] via SUBCUTANEOUS
  Administered 2017-11-14: 5 [IU] via SUBCUTANEOUS

## 2017-11-10 MED ORDER — TORSEMIDE 20 MG PO TABS
20.0000 mg | ORAL_TABLET | Freq: Every day | ORAL | Status: DC
  Administered 2017-11-10 – 2017-11-13 (×4): 20 mg via ORAL
  Filled 2017-11-10 (×4): qty 1

## 2017-11-10 MED ORDER — HYDROMORPHONE HCL 1 MG/ML IJ SOLN
1.0000 mg | INTRAMUSCULAR | Status: DC | PRN
  Administered 2017-11-10 – 2017-11-14 (×9): 1 mg via INTRAVENOUS
  Filled 2017-11-10 (×10): qty 1

## 2017-11-10 MED ORDER — GABAPENTIN 300 MG PO CAPS
ORAL_CAPSULE | ORAL | Status: AC
  Administered 2017-11-10: 300 mg via ORAL
  Filled 2017-11-10: qty 1

## 2017-11-10 MED ORDER — RAMIPRIL 5 MG PO CAPS
5.0000 mg | ORAL_CAPSULE | Freq: Every day | ORAL | Status: DC
  Administered 2017-11-11 – 2017-11-13 (×3): 5 mg via ORAL
  Filled 2017-11-10 (×6): qty 1

## 2017-11-10 MED ORDER — TAMSULOSIN HCL 0.4 MG PO CAPS
0.4000 mg | ORAL_CAPSULE | Freq: Every day | ORAL | Status: DC
  Administered 2017-11-10 – 2017-12-04 (×22): 0.4 mg via ORAL
  Filled 2017-11-10 (×22): qty 1

## 2017-11-10 MED ORDER — ONDANSETRON HCL 4 MG/2ML IJ SOLN
INTRAMUSCULAR | Status: DC | PRN
  Administered 2017-11-10: 4 mg via INTRAVENOUS

## 2017-11-10 MED ORDER — DIPHENHYDRAMINE HCL 50 MG/ML IJ SOLN: 12.5000 mg | Freq: Four times a day (QID) | INTRAMUSCULAR | Status: DC | PRN

## 2017-11-10 MED ORDER — ROCURONIUM BROMIDE 50 MG/5ML IV SOSY
PREFILLED_SYRINGE | INTRAVENOUS | Status: AC
  Filled 2017-11-10: qty 5

## 2017-11-10 MED ORDER — ACETAMINOPHEN 500 MG PO TABS
1000.0000 mg | ORAL_TABLET | ORAL | Status: AC
  Administered 2017-11-10: 1000 mg via ORAL

## 2017-11-10 SURGICAL SUPPLY — 38 items
BLADE CLIPPER SURG (BLADE) ×2 IMPLANT
BLADE SURG 10 STRL SS (BLADE) ×2 IMPLANT
BLADE SURG 15 STRL LF DISP TIS (BLADE) ×1 IMPLANT
BLADE SURG 15 STRL SS (BLADE) ×1
CHLORAPREP W/TINT 26ML (MISCELLANEOUS) ×2 IMPLANT
COVER SURGICAL LIGHT HANDLE (MISCELLANEOUS) ×2 IMPLANT
COVER WAND RF STERILE (DRAPES) ×2 IMPLANT
DERMABOND ADVANCED (GAUZE/BANDAGES/DRESSINGS) ×1
DERMABOND ADVANCED .7 DNX12 (GAUZE/BANDAGES/DRESSINGS) ×1 IMPLANT
DRAIN PENROSE 1/2X12 LTX STRL (WOUND CARE) ×2 IMPLANT
DRAPE LAPAROTOMY TRNSV 102X78 (DRAPE) ×2 IMPLANT
DRAPE UTILITY XL STRL (DRAPES) ×2 IMPLANT
ELECT CAUTERY BLADE 6.4 (BLADE) ×2 IMPLANT
ELECT REM PT RETURN 9FT ADLT (ELECTROSURGICAL) ×2
ELECTRODE REM PT RTRN 9FT ADLT (ELECTROSURGICAL) ×1 IMPLANT
GLOVE SURG SIGNA 7.5 PF LTX (GLOVE) ×2 IMPLANT
GOWN STRL REUS W/ TWL LRG LVL3 (GOWN DISPOSABLE) ×2 IMPLANT
GOWN STRL REUS W/ TWL XL LVL3 (GOWN DISPOSABLE) ×1 IMPLANT
GOWN STRL REUS W/TWL LRG LVL3 (GOWN DISPOSABLE) ×2
GOWN STRL REUS W/TWL XL LVL3 (GOWN DISPOSABLE) ×1
KIT BASIN OR (CUSTOM PROCEDURE TRAY) ×2 IMPLANT
KIT TURNOVER KIT B (KITS) ×2 IMPLANT
MESH PARIETEX PROGRIP LEFT (Mesh General) ×2 IMPLANT
NEEDLE HYPO 25GX1X1/2 BEV (NEEDLE) ×2 IMPLANT
NS IRRIG 1000ML POUR BTL (IV SOLUTION) ×2 IMPLANT
PACK SURGICAL SETUP 50X90 (CUSTOM PROCEDURE TRAY) ×2 IMPLANT
PAD ARMBOARD 7.5X6 YLW CONV (MISCELLANEOUS) ×2 IMPLANT
PENCIL BUTTON HOLSTER BLD 10FT (ELECTRODE) ×2 IMPLANT
SPONGE LAP 18X18 X RAY DECT (DISPOSABLE) ×2 IMPLANT
SUT MON AB 4-0 PC3 18 (SUTURE) ×2 IMPLANT
SUT SILK 2 0 SH (SUTURE) ×2 IMPLANT
SUT VIC AB 2-0 CT1 27 (SUTURE) ×1
SUT VIC AB 2-0 CT1 TAPERPNT 27 (SUTURE) ×1 IMPLANT
SUT VIC AB 3-0 CT1 27 (SUTURE) ×1
SUT VIC AB 3-0 CT1 TAPERPNT 27 (SUTURE) ×1 IMPLANT
SYR CONTROL 10ML LL (SYRINGE) ×2 IMPLANT
TOWEL OR 17X24 6PK STRL BLUE (TOWEL DISPOSABLE) ×2 IMPLANT
TOWEL OR 17X26 10 PK STRL BLUE (TOWEL DISPOSABLE) ×2 IMPLANT

## 2017-11-10 NOTE — Anesthesia Postprocedure Evaluation (Signed)
Anesthesia Post Note  Patient: Michael [name]  Procedure(s) Performed: LEFT INGUINAL HERNIA REPAIR ERAS PATHWAY (Left Abdomen) INSERTION OF MESH (Left Abdomen)     Patient location during evaluation: PACU Anesthesia Type: General and Regional Level of consciousness: awake and alert Pain management: pain level controlled Vital Signs Assessment: post-procedure vital signs reviewed and stable Respiratory status: spontaneous breathing, nonlabored ventilation and respiratory function stable Cardiovascular status: blood pressure returned to baseline and stable Postop Assessment: no apparent nausea or vomiting Anesthetic complications: no    Last Vitals:  Vitals:   11/10/17 1615 11/10/17 1620  BP:  117/61  Pulse: 62 63  Resp: 15 15  Temp:    SpO2: 93% 92%    Last Pain:  Vitals:   11/10/17 1615  TempSrc:   PainSc: Asleep                 Ariele Vidrio,W. EDMOND

## 2017-11-10 NOTE — Op Note (Signed)
LEFT INGUINAL HERNIA REPAIR ERAS PATHWAY, INSERTION OF MESH  Procedure Note  Shaka Cardin 11/10/2017   Pre-op Diagnosis: left inguinal hernia     Post-op Diagnosis: same  Procedure(s): LEFT INGUINAL HERNIA REPAIR ERAS PATHWAY INSERTION OF MESH  Surgeon(s): Coralie Keens, MD  Anesthesia: General  Staff:  Circulator: Hal Morales, RN Relief Scrub: Rolan Bucco Scrub Person: Dollene Cleveland T Circulator Assistant: Beryle Lathe, RN  Estimated Blood Loss: Minimal               Findings: The patient was found to have a large left direct inguinal hernia containing small bowel.  It was repaired with a piece of large pro-grip Prolene mesh  Procedure: The patient was brought to the operating room and identified as correct patient.  He was placed supine on the operating table and general anesthesia was induced.  His abdomen was then prepped and draped in usual sterile fashion.  I anesthetized the skin in the left inguinal area with Marcaine.  I made incision with a scalpel and then dissected down through Scarpa's fascia with electrocautery.  I then opened the external oblique fascia exposing the very large hernia sac.  I separated from the cord structures.  It was a direct hernia defect.  I opened up a large sac and reduced approximately foot of small bowel back into the abdominal cavity.  I then tied off the base of the sac with a silk suture and excised the redundant sac.  I then reapproximated the floor of the inguinal canal with a 2-0 Vicryl suture.  A large piece of Prolene pro-grip mesh was brought to the field.  I placed against the pubic tubercle and then brought around the cord structures.  I then sutured in place with several interrupted 2-0 Vicryl sutures.  There was no further external oblique fascia closed over this.  I then reapproximated Scarpa's fascia with interrupted 3-0 Vicryl sutures and closed the skin with a running 4-0 Monocryl.  Dermabond was then applied.   The patient tolerated the procedure well.  All the counts were correct at the end of the procedure.  Patient was then extubated in the operating room and taken in a stable condition to the recovery room.          Mashawn Brazil A   Date: 11/10/2017  Time: 3:23 PM

## 2017-11-10 NOTE — Anesthesia Preprocedure Evaluation (Addendum)
Anesthesia Evaluation  Patient identified by MRN, date of birth, ID band Patient awake    Reviewed: Allergy & Precautions, NPO status , Patient's Chart, lab work & pertinent test results  Airway Mallampati: II  TM Distance: >3 FB Neck ROM: Full    Dental  (+) Chipped,    Pulmonary neg pulmonary ROS,    breath sounds clear to auscultation       Cardiovascular hypertension, Pt. on medications  Rhythm:Regular Rate:Normal     Neuro/Psych negative neurological ROS     GI/Hepatic Neg liver ROS, hiatal hernia, GERD  Medicated,  Endo/Other  diabetes, Type 2, Insulin Dependent  Renal/GU negative Renal ROS     Musculoskeletal  (+) Arthritis , Osteoarthritis,    Abdominal Normal abdominal exam  (+)   Peds  Hematology   Anesthesia Other Findings   Reproductive/Obstetrics                            Lab Results  Component Value Date   WBC 7.4 11/04/2017   HGB 10.3 (L) 11/04/2017   HCT 36.1 (L) 11/04/2017   MCV 89.8 11/04/2017   PLT 296 11/04/2017   Lab Results  Component Value Date   CREATININE 1.06 11/04/2017   BUN 19 11/04/2017   NA 139 11/04/2017   K 4.5 11/04/2017   CL 107 11/04/2017   CO2 21 (L) 11/04/2017   Lab Results  Component Value Date   INR 1.00 10/19/2016    Echo: - Left ventricle: The cavity size was normal. Wall thickness was   increased in a pattern of mild LVH. Systolic function was   vigorous. The estimated ejection fraction was in the range of 65%   to 70%. Wall motion was normal; there were no regional wall   motion abnormalities. Doppler parameters are consistent with   abnormal left ventricular relaxation (grade 1 diastolic   dysfunction). GLS -22.5%, normal. - Aortic valve: There was no stenosis. - Mitral valve: There was trivial regurgitation. - Right ventricle: The cavity size was normal. Systolic function   was normal. - Atrial septum: Cannot rule out small  PFO but not definite. - Tricuspid valve: Peak RV-RA gradient (S): 16 mm Hg. - Pulmonary arteries: PA peak pressure: 19 mm Hg (S). - Inferior vena cava: The vessel was normal in size. The   respirophasic diameter changes were in the normal range (>= 50%),   consistent with normal central venous pressure.  EKG: normal sinus rhythm.   Anesthesia Physical Anesthesia Plan  ASA: III  Anesthesia Plan: General   Post-op Pain Management: GA combined w/ Regional for post-op pain   Induction: Intravenous  PONV Risk Score and Plan: 3 and Ondansetron, Dexamethasone and Treatment may vary due to age or medical condition  Airway Management Planned: Oral ETT  Additional Equipment: None  Intra-op Plan:   Post-operative Plan: Extubation in OR  Informed Consent: I have reviewed the patients History and Physical, chart, labs and discussed the procedure including the risks, benefits and alternatives for the proposed anesthesia with the patient or authorized representative who has indicated his/her understanding and acceptance.   Dental advisory given  Plan Discussed with: CRNA  Anesthesia Plan Comments:        Anesthesia Quick Evaluation

## 2017-11-10 NOTE — Interval H&P Note (Signed)
History and Physical Interval Note:no change in H and P  11/10/2017 2:07 PM  Michael [name]  has presented today for surgery, with the diagnosis of left inguinal hernia  The various methods of treatment have been discussed with the patient and family. After consideration of risks, benefits and other options for treatment, the patient has consented to  Procedure(s): [place] (Left) INSERTION OF MESH (Left) as a surgical intervention .  The patient's history has been reviewed, patient examined, no change in status, stable for surgery.  I have reviewed the patient's chart and labs.  Questions were answered to the patient's satisfaction.     [name]

## 2017-11-10 NOTE — Anesthesia Procedure Notes (Signed)
Procedure Name: Intubation Date/Time: 11/10/2017 2:49 PM Performed by: Marsa Aris, CRNA Pre-anesthesia Checklist: Patient identified, Emergency Drugs available, Suction available and Patient being monitored Patient Re-evaluated:Patient Re-evaluated prior to induction Oxygen Delivery Method: Circle System Utilized Preoxygenation: Pre-oxygenation with 100% oxygen Induction Type: IV induction Ventilation: Mask ventilation without difficulty Laryngoscope Size: Miller and 2 Grade View: Grade II Tube type: Oral Tube size: 7.5 mm Number of attempts: 1 Airway Equipment and Method: Stylet and Oral airway Placement Confirmation: ETT inserted through vocal cords under direct vision,  positive ETCO2 and breath sounds checked- equal and bilateral Secured at: 22 cm Tube secured with: Tape Dental Injury: Teeth and Oropharynx as per pre-operative assessment

## 2017-11-10 NOTE — Anesthesia Procedure Notes (Signed)
Anesthesia Regional Block: TAP block   Pre-Anesthetic Checklist: ,, timeout performed, Correct Patient, Correct Site, Correct Laterality, Correct Procedure, Correct Position, site marked, Risks and benefits discussed,  Surgical consent,  Pre-op evaluation,  At surgeon's request and post-op pain management  Laterality: Left  Prep: chloraprep       Needles:  Injection technique: Single-shot  Needle Type: Echogenic Stimulator Needle     Needle Length: 9cm  Needle Gauge: 21     Additional Needles:   Procedures:,,,, ultrasound used (permanent image in chart),,,,  Narrative:  Start time: 11/10/2017 2:25 PM End time: 11/10/2017 2:31 PM Injection made incrementally with aspirations every 5 mL.  Performed by: Personally  Anesthesiologist: Effie Berkshire, MD  Additional Notes: Patient tolerated the procedure well. Local anesthetic introduced in an incremental fashion under minimal resistance after negative aspirations. No paresthesias were elicited. After completion of the procedure, no acute issues were identified and patient continued to be monitored by RN.

## 2017-11-10 NOTE — Progress Notes (Signed)
Patient transferred to [phone], receiving RN at bedside, family notified of pts room via volunteers, call bell in reach, bed in lowest position.  Rowe Pavy, RN

## 2017-11-10 NOTE — Transfer of Care (Signed)
Immediate Anesthesia Transfer of Care Note  Patient: Michael [name]  Procedure(s) Performed: LEFT INGUINAL HERNIA REPAIR ERAS PATHWAY (Left Abdomen) INSERTION OF MESH (Left Abdomen)  Patient Location: PACU  Anesthesia Type:GA combined with regional for post-op pain  Level of Consciousness: awake, alert  and oriented  Airway & Oxygen Therapy: Patient Spontanous Breathing  Post-op Assessment: Report given to RN and Post -op Vital signs reviewed and stable  Post vital signs: Reviewed and stable  Last Vitals:  Vitals Value Taken Time  BP 129/50 11/10/2017  3:32 PM  Temp    Pulse 67 11/10/2017  3:36 PM  Resp 16 11/10/2017  3:36 PM  SpO2 98 % 11/10/2017  3:36 PM  Vitals shown include unvalidated device data.  Last Pain:  Vitals:   11/10/17 1229  TempSrc:   PainSc: 4          Complications: No apparent anesthesia complications

## 2017-11-11 ENCOUNTER — Encounter (HOSPITAL_COMMUNITY): Payer: Self-pay | Admitting: Surgery

## 2017-11-11 LAB — GLUCOSE, CAPILLARY
GLUCOSE-CAPILLARY: 228 mg/dL — AB (ref 70–99)
GLUCOSE-CAPILLARY: 208 mg/dL — AB (ref 70–99)
GLUCOSE-CAPILLARY: 188 mg/dL — AB (ref 70–99)
Glucose-Capillary: 183 mg/dL — ABNORMAL HIGH (ref 70–99)

## 2017-11-11 MED ORDER — SENNOSIDES-DOCUSATE SODIUM 8.6-50 MG PO TABS
2.0000 | ORAL_TABLET | Freq: Every day | ORAL | Status: DC | PRN
  Administered 2017-11-11 – 2017-11-13 (×2): 2 via ORAL
  Filled 2017-11-11 (×2): qty 2

## 2017-11-11 NOTE — Progress Notes (Signed)
Inpatient Diabetes Program Recommendations  AACE/ADA: New Consensus Statement on Inpatient Glycemic Control (2015)  Target Ranges:  Prepandial:   less than 140 mg/dL      Peak postprandial:   less than 180 mg/dL (1-2 hours)      Critically ill patients:  140 - 180 mg/dL   Lab Results  Component Value Date   GLUCAP 208 (H) 11/11/2017   HGBA1C 7.0 (H) 11/04/2017    Review of Glycemic ControlResults for Michael [name], Michael [name] (MRN [id number]) as of 11/11/2017 13:04  Ref. Range 11/10/2017 15:39 11/10/2017 17:22 11/10/2017 20:47 11/11/2017 07:50 11/11/2017 12:07  Glucose-Capillary Latest Ref Range: 70 - 99 mg/dL 124 (H) 165 (H) 151 (H) 228 (H) 208 (H)    Diabetes history: Type 2 DM  Outpatient Diabetes medications:  Novolin 70/30  15-20 units bid, Metformin 1000 mg bid Current orders for Inpatient glycemic control:  Novolog moderate tid with meals and HS Inpatient Diabetes Program Recommendations:   Consider adding Lantus 12 units daily while patient is in the hospital.  Thanks,  Adah Perl, RN, BC-ADM Inpatient Diabetes Coordinator Pager [phone] (8a-5p)

## 2017-11-11 NOTE — Progress Notes (Signed)
1 Day Post-Op   Subjective/Chief Complaint: Complains of incisional pain   Objective: Vital signs in last 24 hours: Temp:  [97.4 F (36.3 C)-98.2 F (36.8 C)] 97.5 F (36.4 C) (10/17 0538) Pulse Rate:  [53-74] 62 (10/17 0538) Resp:  [11-30] 18 (10/17 0538) BP: (117-162)/(50-85) 153/70 (10/17 0538) SpO2:  [91 %-100 %] 96 % (10/17 0538) Last BM Date: 11/09/17  Intake/Output from previous day: 10/16 0701 - 10/17 0700 In: 1757.6 [P.O.:480; I.V.:1277.6] Out: 325 [Urine:300; Blood:25] Intake/Output this shift: Total I/O In: 837.6 [P.O.:360; I.V.:477.6] Out: 300 [Urine:300]  Exam: Awake and alert Postop hematoma/seroma  in the left groin Lab Results:  No results for input(s): WBC, HGB, HCT, PLT in the last 72 hours. BMET No results for input(s): NA, K, CL, CO2, GLUCOSE, BUN, CREATININE, CALCIUM in the last 72 hours. PT/INR No results for input(s): LABPROT, INR in the last 72 hours. ABG No results for input(s): PHART, HCO3 in the last 72 hours.  Invalid input(s): PCO2, PO2  Studies/Results: No results found.  Anti-infectives: Anti-infectives (From admission, onward)   Start     Dose/Rate Route Frequency Ordered Stop   11/10/17 1210  ceFAZolin (ANCEF) 2-4 GM/100ML-% IVPB    Note to Pharmacy:  Michael [name]   : cabinet override      11/10/17 1210 11/10/17 1451   11/10/17 1200  ceFAZolin (ANCEF) IVPB 2g/100 mL premix     2 g 200 mL/hr over 30 Minutes Intravenous On call to O.R. 11/10/17 1158 11/10/17 1501      Assessment/Plan: s/p Procedure(s): LEFT INGUINAL HERNIA REPAIR ERAS PATHWAY (Left) INSERTION OF MESH (Left)  Unfortunately, he developed [name] hematoma at the operative site of the large left inguinal hernia.  His pain from this is improving but he still requires some IV meds. We discussed evacuation of the hematoma in the OR vs letting it liquify over time and aspiration in the office.  He wants to hold on any surgery for now which is reasonable as he is not in  any acute distress.  Will continue pain control.  Hold lovenox. Hopefully will be ready to d/c tomorrow.  LOS: 0 days    Michael [name] 11/11/2017

## 2017-11-12 ENCOUNTER — Other Ambulatory Visit: Payer: Self-pay

## 2017-11-12 LAB — GLUCOSE, CAPILLARY
GLUCOSE-CAPILLARY: 189 mg/dL — AB (ref 70–99)
Glucose-Capillary: 168 mg/dL — ABNORMAL HIGH (ref 70–99)
Glucose-Capillary: 221 mg/dL — ABNORMAL HIGH (ref 70–99)
Glucose-Capillary: 294 mg/dL — ABNORMAL HIGH (ref 70–99)

## 2017-11-12 MED ORDER — BISACODYL 10 MG RE SUPP
10.0000 mg | Freq: Once | RECTAL | Status: AC
  Administered 2017-11-12: 10 mg via RECTAL
  Filled 2017-11-12: qty 1

## 2017-11-12 MED ORDER — OXYCODONE HCL 5 MG PO TABS: 5.0000 mg | ORAL_TABLET | Freq: Four times a day (QID) | ORAL | 0 refills | Status: DC | PRN

## 2017-11-12 MED ORDER — BISACODYL 10 MG RE SUPP
10.0000 mg | Freq: Once | RECTAL | Status: AC
  Administered 2017-11-13: 10 mg via RECTAL
  Filled 2017-11-12: qty 1

## 2017-11-12 MED ORDER — POLYETHYLENE GLYCOL 3350 17 G PO PACK
17.0000 g | PACK | Freq: Every day | ORAL | Status: DC
  Administered 2017-11-12 – 2017-11-13 (×2): 17 g via ORAL
  Filled 2017-11-12 (×2): qty 1

## 2017-11-12 NOTE — Discharge Summary (Signed)
Physician Discharge Summary  Patient ID: Michael [name] MRN: [id number] DOB/AGE: 1930-07-25 84 y.o.  Admit date: 11/10/2017 Discharge date: 11/12/2017  Admission Diagnoses:  Discharge Diagnoses:  Active Problems:   S/P left inguinal hernia repair   Discharged Condition: good  Hospital Course: admitted s/p left inguinal hernia repair for chronically incarcerated hernia.  Developed left inguinal hematoma/seroma post op.  We discussed evacuation in the OR vs aspiration in the office in a few weeks.  He decided to hold on further surgery and let me aspirate it in the office when if liquefies. Discharged home pod #2  Consults: None  Significant Diagnostic Studies:   Treatments: surgery: repair incarcerated left inguinal hernia with mesh  Discharge Exam: Blood pressure 116/61, pulse 75, temperature 98.1 F (36.7 C), temperature source Oral, resp. rate 18, SpO2 95 %. General appearance: alert, cooperative and no distress Resp: clear to auscultation bilaterally Cardio: regular rate and rhythm, S1, S2 normal, no murmur, click, rub or gallop Incision/Wound: incision clean, moderate to large left groin hematoma/seroma, non-tender  Disposition:     Follow-up Information    Coralie Keens, MD. Schedule an appointment as soon as possible for a visit in 3 week(s).   Specialty:  General Surgery Contact information: [address] [place]  [address] [phone]           Signed: Harl Bowie 11/12/2017, 6:51 AM

## 2017-11-12 NOTE — Progress Notes (Signed)
Patient ID: Michael [name], male   DOB: 1930/12/06, 84 y.o.   MRN: [id number]   More comfortable this morning Ambulating Wants to have a BM.  Has chronic constipation. Wants to go home today  Exam: Left groin hematoma/seroma stable Looks comfortable  Plan: Home today after lunch

## 2017-11-12 NOTE — Plan of Care (Signed)
  Problem: Clinical Measurements: Goal: Ability to maintain clinical measurements within normal limits will improve Outcome: Not Progressing Goal: Diagnostic test results will improve Outcome: Not Progressing   Problem: Activity: Goal: Risk for activity intolerance will decrease Outcome: Not Progressing   Problem: Nutrition: Goal: Adequate nutrition will be maintained Outcome: Not Progressing

## 2017-11-12 NOTE — Progress Notes (Signed)
Patient ID: Michael [name], male   DOB: 1930-01-30, 84 y.o.   MRN: [id number]   Wound draining seroma fluid in left groin  Had small hard BM  Given drainage, will keep him for wound care and more miralax and suppos

## 2017-11-13 DIAGNOSIS — K559 Vascular disorder of intestine, unspecified: Secondary | ICD-10-CM | POA: Diagnosis not present

## 2017-11-13 DIAGNOSIS — E870 Hyperosmolality and hypernatremia: Secondary | ICD-10-CM | POA: Diagnosis not present

## 2017-11-13 DIAGNOSIS — C9110 Chronic lymphocytic leukemia of B-cell type not having achieved remission: Secondary | ICD-10-CM | POA: Diagnosis not present

## 2017-11-13 DIAGNOSIS — K567 Ileus, unspecified: Secondary | ICD-10-CM | POA: Diagnosis not present

## 2017-11-13 DIAGNOSIS — E440 Moderate protein-calorie malnutrition: Secondary | ICD-10-CM | POA: Diagnosis not present

## 2017-11-13 DIAGNOSIS — T8149XA Infection following a procedure, other surgical site, initial encounter: Secondary | ICD-10-CM | POA: Diagnosis not present

## 2017-11-13 DIAGNOSIS — N170 Acute kidney failure with tubular necrosis: Secondary | ICD-10-CM | POA: Diagnosis not present

## 2017-11-13 DIAGNOSIS — K4031 Unilateral inguinal hernia, with obstruction, without gangrene, recurrent: Secondary | ICD-10-CM | POA: Diagnosis not present

## 2017-11-13 DIAGNOSIS — I10 Essential (primary) hypertension: Secondary | ICD-10-CM | POA: Diagnosis not present

## 2017-11-13 DIAGNOSIS — L7634 Postprocedural seroma of skin and subcutaneous tissue following other procedure: Secondary | ICD-10-CM | POA: Diagnosis not present

## 2017-11-13 LAB — GLUCOSE, CAPILLARY
GLUCOSE-CAPILLARY: 234 mg/dL — AB (ref 70–99)
GLUCOSE-CAPILLARY: 192 mg/dL — AB (ref 70–99)
GLUCOSE-CAPILLARY: 206 mg/dL — AB (ref 70–99)
Glucose-Capillary: 251 mg/dL — ABNORMAL HIGH (ref 70–99)

## 2017-11-13 LAB — TROPONIN I: Troponin I: <0.03 ng/mL (ref <0.03)

## 2017-11-13 LAB — CBC
HCT: 34.3 % — ABNORMAL LOW (ref 39.0–52.0)
Hemoglobin: 10.9 g/dL — ABNORMAL LOW (ref 13.0–17.0)
MCH: 26.5 pg (ref 26.0–34.0)
MCHC: 31.8 g/dL (ref 30.0–36.0)
MCV: 83.3 fL (ref 80.0–100.0)
PLATELETS: 220 10*3/uL (ref 150–400)
RBC: 4.12 MIL/uL — ABNORMAL LOW (ref 4.22–5.81)
RDW: 16.8 % — ABNORMAL HIGH (ref 11.5–15.5)
WBC: 18.1 10*3/uL — ABNORMAL HIGH (ref 4.0–10.5)
nRBC: 0.1 % (ref 0.0–0.2)

## 2017-11-13 LAB — BASIC METABOLIC PANEL
Anion gap: 12 (ref 5–15)
BUN: 16 mg/dL (ref 8–23)
CHLORIDE: 98 mmol/L (ref 98–111)
CO2: 30 mmol/L (ref 22–32)
CREATININE: 1.03 mg/dL (ref 0.61–1.24)
Calcium: 9.8 mg/dL (ref 8.9–10.3)
GFR calc Af Amer: >60 mL/min (ref >60)
GLUCOSE: 232 mg/dL — AB (ref 70–99)
POTASSIUM: 4 mmol/L (ref 3.5–5.1)
SODIUM: 140 mmol/L (ref 135–145)

## 2017-11-13 MED ORDER — METOCLOPRAMIDE HCL 5 MG/ML IJ SOLN
5.0000 mg | Freq: Three times a day (TID) | INTRAMUSCULAR | Status: DC | PRN
  Administered 2017-11-13: 5 mg via INTRAVENOUS
  Filled 2017-11-13: qty 2

## 2017-11-13 MED ORDER — PANTOPRAZOLE SODIUM 40 MG PO TBEC
40.0000 mg | DELAYED_RELEASE_TABLET | Freq: Every day | ORAL | Status: DC
  Administered 2017-11-13: 40 mg via ORAL
  Filled 2017-11-13: qty 1

## 2017-11-13 MED ORDER — SIMETHICONE 40 MG/0.6ML PO SUSP
80.0000 mg | Freq: Four times a day (QID) | ORAL | Status: DC | PRN
  Administered 2017-11-13: 80 mg via ORAL
  Filled 2017-11-13 (×2): qty 1.2

## 2017-11-13 MED ORDER — ALUM & MAG HYDROXIDE-SIMETH 200-200-20 MG/5ML PO SUSP
30.0000 mL | ORAL | Status: DC | PRN
  Administered 2017-11-13 – 2017-11-27 (×3): 30 mL via ORAL
  Filled 2017-11-13 (×3): qty 30

## 2017-11-13 NOTE — Progress Notes (Signed)
3 Days Post-Op   Subjective/Chief Complaint: Complains of chest pain this am. Vomited once   Objective: Vital signs in last 24 hours: Temp:  [97.8 F (36.6 C)-98.7 F (37.1 C)] 97.8 F (36.6 C) (10/19 0525) Pulse Rate:  [66-94] 94 (10/19 0525) Resp:  [16-20] 16 (10/19 0525) BP: (145-157)/(60-75) 152/74 (10/19 0525) SpO2:  [98 %] 98 % (10/19 0525) Last BM Date: 11/12/17  Intake/Output from previous day: 10/18 0701 - 10/19 0700 In: 5956 [P.O.:600; I.V.:651] Out: 1575 [Urine:1575] Intake/Output this shift: Total I/O In: 170.1 [P.O.:120; I.V.:50.1] Out: -   General appearance: alert and cooperative Resp: clear to auscultation bilaterally Cardio: regular rate and rhythm GI: soft, mild tenderness. groin incision ok  Lab Results:  Recent Labs    11/13/17 0303  WBC 18.1*  HGB 10.9*  HCT 34.3*  PLT 220   BMET Recent Labs    11/13/17 0303  NA 140  K 4.0  CL 98  CO2 30  GLUCOSE 232*  BUN 16  CREATININE 1.03  CALCIUM 9.8   PT/INR No results for input(s): LABPROT, INR in the last 72 hours. ABG No results for input(s): PHART, HCO3 in the last 72 hours.  Invalid input(s): PCO2, PO2  Studies/Results: No results found.  Anti-infectives: Anti-infectives (From admission, onward)   Start     Dose/Rate Route Frequency Ordered Stop   11/10/17 1210  ceFAZolin (ANCEF) 2-4 GM/100ML-% IVPB    Note to Pharmacy:  [place]   : cabinet override      11/10/17 1210 11/10/17 1451   11/10/17 1200  ceFAZolin (ANCEF) IVPB 2g/100 mL premix     2 g 200 mL/hr over 30 Minutes Intravenous On call to O.R. 11/10/17 1158 11/10/17 1501      Assessment/Plan: s/p Procedure(s): LEFT INGUINAL HERNIA REPAIR ERAS PATHWAY (Left) INSERTION OF MESH (Left) Maalox for indigestion  EKG showed no acute changes. Will check troponin Will add reglan for ileus as well Will need continued monitoring  LOS: 0 days    Autumn Messing III 11/13/2017

## 2017-11-14 ENCOUNTER — Inpatient Hospital Stay (HOSPITAL_COMMUNITY): Payer: Medicare HMO

## 2017-11-14 ENCOUNTER — Observation Stay (HOSPITAL_COMMUNITY): Payer: Medicare HMO

## 2017-11-14 DIAGNOSIS — N170 Acute kidney failure with tubular necrosis: Secondary | ICD-10-CM | POA: Diagnosis not present

## 2017-11-14 DIAGNOSIS — K55019 Acute (reversible) ischemia of small intestine, extent unspecified: Secondary | ICD-10-CM | POA: Diagnosis not present

## 2017-11-14 DIAGNOSIS — R41841 Cognitive communication deficit: Secondary | ICD-10-CM | POA: Diagnosis not present

## 2017-11-14 DIAGNOSIS — D638 Anemia in other chronic diseases classified elsewhere: Secondary | ICD-10-CM | POA: Diagnosis not present

## 2017-11-14 DIAGNOSIS — N19 Unspecified kidney failure: Secondary | ICD-10-CM | POA: Diagnosis not present

## 2017-11-14 DIAGNOSIS — C9110 Chronic lymphocytic leukemia of B-cell type not having achieved remission: Secondary | ICD-10-CM | POA: Diagnosis not present

## 2017-11-14 DIAGNOSIS — E1129 Type 2 diabetes mellitus with other diabetic kidney complication: Secondary | ICD-10-CM | POA: Diagnosis not present

## 2017-11-14 DIAGNOSIS — I10 Essential (primary) hypertension: Secondary | ICD-10-CM | POA: Diagnosis not present

## 2017-11-14 DIAGNOSIS — J9811 Atelectasis: Secondary | ICD-10-CM | POA: Diagnosis not present

## 2017-11-14 DIAGNOSIS — K4031 Unilateral inguinal hernia, with obstruction, without gangrene, recurrent: Secondary | ICD-10-CM | POA: Diagnosis not present

## 2017-11-14 DIAGNOSIS — I119 Hypertensive heart disease without heart failure: Secondary | ICD-10-CM | POA: Diagnosis not present

## 2017-11-14 DIAGNOSIS — L7634 Postprocedural seroma of skin and subcutaneous tissue following other procedure: Secondary | ICD-10-CM | POA: Diagnosis not present

## 2017-11-14 DIAGNOSIS — M19012 Primary osteoarthritis, left shoulder: Secondary | ICD-10-CM | POA: Diagnosis present

## 2017-11-14 DIAGNOSIS — I998 Other disorder of circulatory system: Secondary | ICD-10-CM | POA: Diagnosis not present

## 2017-11-14 DIAGNOSIS — R2689 Other abnormalities of gait and mobility: Secondary | ICD-10-CM | POA: Diagnosis not present

## 2017-11-14 DIAGNOSIS — H04129 Dry eye syndrome of unspecified lacrimal gland: Secondary | ICD-10-CM | POA: Diagnosis not present

## 2017-11-14 DIAGNOSIS — K567 Ileus, unspecified: Secondary | ICD-10-CM | POA: Diagnosis not present

## 2017-11-14 DIAGNOSIS — R5381 Other malaise: Secondary | ICD-10-CM | POA: Diagnosis not present

## 2017-11-14 DIAGNOSIS — Z6827 Body mass index (BMI) 27.0-27.9, adult: Secondary | ICD-10-CM | POA: Diagnosis not present

## 2017-11-14 DIAGNOSIS — R1312 Dysphagia, oropharyngeal phase: Secondary | ICD-10-CM | POA: Diagnosis not present

## 2017-11-14 DIAGNOSIS — T8149XA Infection following a procedure, other surgical site, initial encounter: Secondary | ICD-10-CM | POA: Diagnosis not present

## 2017-11-14 DIAGNOSIS — K559 Vascular disorder of intestine, unspecified: Secondary | ICD-10-CM | POA: Diagnosis not present

## 2017-11-14 DIAGNOSIS — Z8719 Personal history of other diseases of the digestive system: Secondary | ICD-10-CM | POA: Diagnosis not present

## 2017-11-14 DIAGNOSIS — K219 Gastro-esophageal reflux disease without esophagitis: Secondary | ICD-10-CM | POA: Diagnosis not present

## 2017-11-14 DIAGNOSIS — D649 Anemia, unspecified: Secondary | ICD-10-CM | POA: Diagnosis not present

## 2017-11-14 DIAGNOSIS — Z452 Encounter for adjustment and management of vascular access device: Secondary | ICD-10-CM | POA: Diagnosis not present

## 2017-11-14 DIAGNOSIS — K30 Functional dyspepsia: Secondary | ICD-10-CM | POA: Diagnosis not present

## 2017-11-14 DIAGNOSIS — M4690 Unspecified inflammatory spondylopathy, site unspecified: Secondary | ICD-10-CM | POA: Diagnosis present

## 2017-11-14 DIAGNOSIS — E870 Hyperosmolality and hypernatremia: Secondary | ICD-10-CM | POA: Diagnosis not present

## 2017-11-14 DIAGNOSIS — Y838 Other surgical procedures as the cause of abnormal reaction of the patient, or of later complication, without mention of misadventure at the time of the procedure: Secondary | ICD-10-CM | POA: Diagnosis not present

## 2017-11-14 DIAGNOSIS — K4090 Unilateral inguinal hernia, without obstruction or gangrene, not specified as recurrent: Secondary | ICD-10-CM | POA: Diagnosis not present

## 2017-11-14 DIAGNOSIS — M2550 Pain in unspecified joint: Secondary | ICD-10-CM | POA: Diagnosis not present

## 2017-11-14 DIAGNOSIS — L8990 Pressure ulcer of unspecified site, unspecified stage: Secondary | ICD-10-CM | POA: Diagnosis present

## 2017-11-14 DIAGNOSIS — E119 Type 2 diabetes mellitus without complications: Secondary | ICD-10-CM | POA: Diagnosis not present

## 2017-11-14 DIAGNOSIS — Z7401 Bed confinement status: Secondary | ICD-10-CM | POA: Diagnosis not present

## 2017-11-14 DIAGNOSIS — T82898A Other specified complication of vascular prosthetic devices, implants and grafts, initial encounter: Secondary | ICD-10-CM | POA: Diagnosis not present

## 2017-11-14 DIAGNOSIS — Z4682 Encounter for fitting and adjustment of non-vascular catheter: Secondary | ICD-10-CM | POA: Diagnosis not present

## 2017-11-14 DIAGNOSIS — Z9889 Other specified postprocedural states: Secondary | ICD-10-CM | POA: Diagnosis not present

## 2017-11-14 DIAGNOSIS — K55021 Focal (segmental) acute infarction of small intestine: Secondary | ICD-10-CM | POA: Diagnosis not present

## 2017-11-14 DIAGNOSIS — R140 Abdominal distension (gaseous): Secondary | ICD-10-CM | POA: Diagnosis not present

## 2017-11-14 DIAGNOSIS — E440 Moderate protein-calorie malnutrition: Secondary | ICD-10-CM | POA: Diagnosis not present

## 2017-11-14 DIAGNOSIS — R531 Weakness: Secondary | ICD-10-CM | POA: Diagnosis not present

## 2017-11-14 DIAGNOSIS — M6281 Muscle weakness (generalized): Secondary | ICD-10-CM | POA: Diagnosis not present

## 2017-11-14 LAB — CBC WITH DIFFERENTIAL/PLATELET
Band Neutrophils: 19 %
Basophils Absolute: 0 10*3/uL (ref 0.0–0.1)
Basophils Relative: 0 %
Blasts: 12 %
EOS ABS: 0 10*3/uL (ref 0.0–0.5)
EOS PCT: 0 %
HEMATOCRIT: 36.3 % — AB (ref 39.0–52.0)
HEMOGLOBIN: 11.3 g/dL — AB (ref 13.0–17.0)
LYMPHS ABS: 4 10*3/uL (ref 0.7–4.0)
LYMPHS PCT: 19 %
MCH: 26.3 pg (ref 26.0–34.0)
MCHC: 31.1 g/dL (ref 30.0–36.0)
MCV: 84.6 fL (ref 80.0–100.0)
METAMYELOCYTES PCT: 6 %
MONO ABS: 1.1 10*3/uL — AB (ref 0.1–1.0)
Monocytes Relative: 5 %
Neutro Abs: 13.4 10*3/uL — ABNORMAL HIGH (ref 1.7–7.7)
Neutrophils Relative %: 39 %
PLATELETS: 231 10*3/uL (ref 150–400)
RBC: 4.29 MIL/uL (ref 4.22–5.81)
RDW: 16.8 % — ABNORMAL HIGH (ref 11.5–15.5)
WBC: 21.2 10*3/uL — ABNORMAL HIGH (ref 4.0–10.5)
nRBC: 0.3 % — ABNORMAL HIGH (ref 0.0–0.2)
nRBC: 2 /100 WBC — ABNORMAL HIGH

## 2017-11-14 LAB — GLUCOSE, CAPILLARY
GLUCOSE-CAPILLARY: 244 mg/dL — AB (ref 70–99)
Glucose-Capillary: 288 mg/dL — ABNORMAL HIGH (ref 70–99)
Glucose-Capillary: 232 mg/dL — ABNORMAL HIGH (ref 70–99)
Glucose-Capillary: 221 mg/dL — ABNORMAL HIGH (ref 70–99)

## 2017-11-14 LAB — BASIC METABOLIC PANEL
Anion gap: 15 (ref 5–15)
BUN: 30 mg/dL — ABNORMAL HIGH (ref 8–23)
CHLORIDE: 99 mmol/L (ref 98–111)
CO2: 28 mmol/L (ref 22–32)
Calcium: 9.3 mg/dL (ref 8.9–10.3)
Creatinine, Ser: 1.82 mg/dL — ABNORMAL HIGH (ref 0.61–1.24)
GFR calc Af Amer: 37 mL/min — ABNORMAL LOW (ref >60)
GFR, EST NON AFRICAN AMERICAN: 32 mL/min — AB (ref >60)
GLUCOSE: 269 mg/dL — AB (ref 70–99)
POTASSIUM: 3.7 mmol/L (ref 3.5–5.1)
Sodium: 142 mmol/L (ref 135–145)

## 2017-11-14 MED ORDER — FAMOTIDINE IN NACL 20-0.9 MG/50ML-% IV SOLN
20.0000 mg | Freq: Every day | INTRAVENOUS | Status: DC
  Administered 2017-11-14 – 2017-11-21 (×8): 20 mg via INTRAVENOUS
  Filled 2017-11-14 (×9): qty 50

## 2017-11-14 MED ORDER — SODIUM CHLORIDE 0.45 % IV BOLUS
500.0000 mL | Freq: Once | INTRAVENOUS | Status: AC
  Administered 2017-11-14: 500 mL via INTRAVENOUS

## 2017-11-14 MED ORDER — ORAL CARE MOUTH RINSE
15.0000 mL | Freq: Two times a day (BID) | OROMUCOSAL | Status: DC
  Administered 2017-11-14 – 2017-12-03 (×37): 15 mL via OROMUCOSAL

## 2017-11-14 MED ORDER — PHENOL 1.4 % MT LIQD
1.0000 | OROMUCOSAL | Status: DC | PRN
  Administered 2017-11-14 – 2017-11-23 (×2): 1 via OROMUCOSAL
  Filled 2017-11-14: qty 177

## 2017-11-14 MED ORDER — FUROSEMIDE 10 MG/ML IJ SOLN
20.0000 mg | Freq: Every day | INTRAMUSCULAR | Status: DC
  Administered 2017-11-14 – 2017-11-22 (×9): 20 mg via INTRAVENOUS
  Filled 2017-11-14 (×9): qty 2

## 2017-11-14 MED ORDER — ACETAMINOPHEN 325 MG PO TABS
650.0000 mg | ORAL_TABLET | Freq: Four times a day (QID) | ORAL | Status: DC | PRN
  Administered 2017-11-14 – 2017-11-15 (×2): 650 mg
  Filled 2017-11-14 (×2): qty 2

## 2017-11-14 MED ORDER — METOCLOPRAMIDE HCL 5 MG/ML IJ SOLN
5.0000 mg | Freq: Three times a day (TID) | INTRAMUSCULAR | Status: AC
  Administered 2017-11-14 – 2017-11-15 (×3): 5 mg via INTRAVENOUS
  Filled 2017-11-14 (×3): qty 2

## 2017-11-14 NOTE — Plan of Care (Signed)
  Problem: Education: Goal: Knowledge of General Education information will improve Description: Including pain rating scale, medication(s)/side effects and non-pharmacologic comfort measures Outcome: Progressing   Problem: Activity: Goal: Risk for activity intolerance will decrease Outcome: Progressing   

## 2017-11-14 NOTE — Progress Notes (Signed)
Ongoing nausea and vomiting, place NG and change PO meds to IV.

## 2017-11-14 NOTE — Progress Notes (Addendum)
Patient lying curled up down in bed, complaining of continued pain, trouble catching breath. Repositioned in bed, elevated head. SpO2 95 lying in bed on RA. Will re eval. in 15 min, as pain meds should be starting to work by that time.    0100 patient breathing ok, "feeling better" but still can't get comfortable, pain lower abdomen. Admin IV pain med, will cont to monitor.

## 2017-11-14 NOTE — Progress Notes (Signed)
4 Days Post-Op   Subjective/Chief Complaint: Still having nausea and vomiting   Objective: Vital signs in last 24 hours: Temp:  [98.1 F (36.7 C)-99.2 F (37.3 C)] 99.2 F (37.3 C) (10/20 0431) Pulse Rate:  [85-126] 126 (10/20 0431) Resp:  [17-20] 17 (10/20 0431) BP: (127-146)/(57-72) 127/57 (10/20 0431) SpO2:  [91 %-99 %] 91 % (10/20 0431) Last BM Date: 11/13/17  Intake/Output from previous day: 10/19 0701 - 10/20 0700 In: 1138 [P.O.:360; I.V.:778] Out: 1500 [Urine:1300; Emesis/NG output:200] Intake/Output this shift: No intake/output data recorded.  General appearance: alert and cooperative Resp: clear to auscultation bilaterally Cardio: regular rate and rhythm GI: distended. mild tenderness  Lab Results:  Recent Labs    11/13/17 0303  WBC 18.1*  HGB 10.9*  HCT 34.3*  PLT 220   BMET Recent Labs    11/13/17 0303  NA 140  K 4.0  CL 98  CO2 30  GLUCOSE 232*  BUN 16  CREATININE 1.03  CALCIUM 9.8   PT/INR No results for input(s): LABPROT, INR in the last 72 hours. ABG No results for input(s): PHART, HCO3 in the last 72 hours.  Invalid input(s): PCO2, PO2  Studies/Results: No results found.  Anti-infectives: Anti-infectives (From admission, onward)   Start     Dose/Rate Route Frequency Ordered Stop   11/10/17 1210  ceFAZolin (ANCEF) 2-4 GM/100ML-% IVPB    Note to Pharmacy:  [place]   : cabinet override      11/10/17 1210 11/10/17 1451   11/10/17 1200  ceFAZolin (ANCEF) IVPB 2g/100 mL premix     2 g 200 mL/hr over 30 Minutes Intravenous On call to O.R. 11/10/17 1158 11/10/17 1501      Assessment/Plan: s/p Procedure(s): LEFT INGUINAL HERNIA REPAIR ERAS PATHWAY (Left) INSERTION OF MESH (Left) persistent ileus. Will make npo. May need to place ng if he continues to vomit. Add reglan. Check wbc and lytes Check abd xrays today Continue to monitor  LOS: 0 days    Autumn Messing III 11/14/2017

## 2017-11-14 NOTE — Progress Notes (Signed)
Pt with persistent N/V and miserable.  Placed 12 F NGT left nare with immediate brown golden drainage returned.  Order for ABD 1 view placed to verify placement.

## 2017-11-14 NOTE — Progress Notes (Signed)
Pt has had over 2000 cc brown/goldenish drainage from the NGT since placed, abdomen much softer.

## 2017-11-15 ENCOUNTER — Encounter (HOSPITAL_COMMUNITY): Payer: Self-pay | Admitting: Certified Registered"

## 2017-11-15 ENCOUNTER — Inpatient Hospital Stay (HOSPITAL_COMMUNITY): Payer: Medicare HMO | Admitting: Certified Registered"

## 2017-11-15 ENCOUNTER — Encounter (HOSPITAL_COMMUNITY)
Admission: AD | Disposition: A | Discharge status: Skilled Nursing Facility | Payer: Self-pay | Source: Home / Self Care | Attending: Surgery

## 2017-11-15 ENCOUNTER — Inpatient Hospital Stay (HOSPITAL_COMMUNITY): Payer: Medicare HMO

## 2017-11-15 HISTORY — PX: BOWEL RESECTION: SHX1257

## 2017-11-15 HISTORY — PX: GROIN DISSECTION: SHX5250

## 2017-11-15 HISTORY — PX: INGUINAL HERNIA REPAIR: SHX194

## 2017-11-15 LAB — GLUCOSE, CAPILLARY
GLUCOSE-CAPILLARY: 144 mg/dL — AB (ref 70–99)
GLUCOSE-CAPILLARY: 134 mg/dL — AB (ref 70–99)
GLUCOSE-CAPILLARY: 135 mg/dL — AB (ref 70–99)
Glucose-Capillary: 116 mg/dL — ABNORMAL HIGH (ref 70–99)
Glucose-Capillary: 120 mg/dL — ABNORMAL HIGH (ref 70–99)
Glucose-Capillary: 125 mg/dL — ABNORMAL HIGH (ref 70–99)

## 2017-11-15 SURGERY — REPAIR, HERNIA, INGUINAL, ADULT
Anesthesia: General | Site: Groin

## 2017-11-15 MED ORDER — SUCCINYLCHOLINE CHLORIDE 20 MG/ML IJ SOLN
INTRAMUSCULAR | Status: DC | PRN
  Administered 2017-11-15: 140 mg via INTRAVENOUS

## 2017-11-15 MED ORDER — ONDANSETRON HCL 4 MG/2ML IJ SOLN: 4.0000 mg | Freq: Once | INTRAMUSCULAR | Status: DC | PRN

## 2017-11-15 MED ORDER — PROPOFOL 10 MG/ML IV BOLUS
INTRAVENOUS | Status: DC | PRN
  Administered 2017-11-15: 150 mg via INTRAVENOUS

## 2017-11-15 MED ORDER — POTASSIUM CHLORIDE IN NACL 20-0.9 MEQ/L-% IV SOLN
INTRAVENOUS | Status: DC
  Administered 2017-11-15 – 2017-11-18 (×5): via INTRAVENOUS
  Filled 2017-11-15 (×3): qty 1000

## 2017-11-15 MED ORDER — ONDANSETRON HCL 4 MG/2ML IJ SOLN
INTRAMUSCULAR | Status: DC | PRN
  Administered 2017-11-15: 4 mg via INTRAVENOUS

## 2017-11-15 MED ORDER — PIPERACILLIN-TAZOBACTAM 3.375 G IVPB 30 MIN
3.3750 g | Freq: Once | INTRAVENOUS | Status: AC
  Administered 2017-11-15: 3.375 g via INTRAVENOUS
  Filled 2017-11-15 (×2): qty 50

## 2017-11-15 MED ORDER — FENTANYL CITRATE (PF) 100 MCG/2ML IJ SOLN
25.0000 ug | INTRAMUSCULAR | Status: DC | PRN
  Administered 2017-11-15 (×3): 25 ug via INTRAVENOUS

## 2017-11-15 MED ORDER — FENTANYL CITRATE (PF) 100 MCG/2ML IJ SOLN
INTRAMUSCULAR | Status: AC
  Filled 2017-11-15: qty 2

## 2017-11-15 MED ORDER — PHENYLEPHRINE 40 MCG/ML (10ML) SYRINGE FOR IV PUSH (FOR BLOOD PRESSURE SUPPORT)
PREFILLED_SYRINGE | INTRAVENOUS | Status: DC | PRN
  Administered 2017-11-15: 80 ug via INTRAVENOUS
  Administered 2017-11-15: 160 ug via INTRAVENOUS
  Administered 2017-11-15 (×2): 80 ug via INTRAVENOUS

## 2017-11-15 MED ORDER — FENTANYL CITRATE (PF) 250 MCG/5ML IJ SOLN
INTRAMUSCULAR | Status: AC
  Filled 2017-11-15: qty 5

## 2017-11-15 MED ORDER — LIDOCAINE 2% (20 MG/ML) 5 ML SYRINGE
INTRAMUSCULAR | Status: DC | PRN
  Administered 2017-11-15: 80 mg via INTRAVENOUS

## 2017-11-15 MED ORDER — LACTATED RINGERS IV SOLN: INTRAVENOUS | Status: DC

## 2017-11-15 MED ORDER — LIDOCAINE 2% (20 MG/ML) 5 ML SYRINGE
INTRAMUSCULAR | Status: AC
  Filled 2017-11-15: qty 5

## 2017-11-15 MED ORDER — PROPOFOL 10 MG/ML IV BOLUS
INTRAVENOUS | Status: AC
  Filled 2017-11-15: qty 20

## 2017-11-15 MED ORDER — SUCCINYLCHOLINE CHLORIDE 200 MG/10ML IV SOSY
PREFILLED_SYRINGE | INTRAVENOUS | Status: AC
  Filled 2017-11-15: qty 10

## 2017-11-15 MED ORDER — ROCURONIUM BROMIDE 50 MG/5ML IV SOSY
PREFILLED_SYRINGE | INTRAVENOUS | Status: AC
  Filled 2017-11-15: qty 5

## 2017-11-15 MED ORDER — INSULIN ASPART 100 UNIT/ML ~~LOC~~ SOLN
0.0000 [IU] | SUBCUTANEOUS | Status: DC
  Administered 2017-11-15 – 2017-11-17 (×11): 2 [IU] via SUBCUTANEOUS
  Administered 2017-11-17: 3 [IU] via SUBCUTANEOUS
  Administered 2017-11-17 – 2017-11-18 (×2): 2 [IU] via SUBCUTANEOUS
  Administered 2017-11-18: 5 [IU] via SUBCUTANEOUS
  Administered 2017-11-18 (×2): 3 [IU] via SUBCUTANEOUS
  Administered 2017-11-18: 2 [IU] via SUBCUTANEOUS
  Administered 2017-11-19: 3 [IU] via SUBCUTANEOUS
  Administered 2017-11-19: 2 [IU] via SUBCUTANEOUS
  Administered 2017-11-19: 8 [IU] via SUBCUTANEOUS
  Administered 2017-11-19: 3 [IU] via SUBCUTANEOUS
  Administered 2017-11-20: 5 [IU] via SUBCUTANEOUS
  Administered 2017-11-20: 3 [IU] via SUBCUTANEOUS
  Administered 2017-11-20: 5 [IU] via SUBCUTANEOUS
  Administered 2017-11-20: 3 [IU] via SUBCUTANEOUS
  Administered 2017-11-20: 2 [IU] via SUBCUTANEOUS
  Administered 2017-11-20: 3 [IU] via SUBCUTANEOUS
  Administered 2017-11-21: 8 [IU] via SUBCUTANEOUS
  Administered 2017-11-21: 11 [IU] via SUBCUTANEOUS
  Administered 2017-11-21: 8 [IU] via SUBCUTANEOUS
  Administered 2017-11-21: 11 [IU] via SUBCUTANEOUS
  Administered 2017-11-21 – 2017-11-22 (×3): 8 [IU] via SUBCUTANEOUS
  Administered 2017-11-22: 5 [IU] via SUBCUTANEOUS

## 2017-11-15 MED ORDER — SODIUM CHLORIDE 0.9 % IV SOLN
INTRAVENOUS | Status: DC | PRN
  Administered 2017-11-15: 50 ug/min via INTRAVENOUS

## 2017-11-15 MED ORDER — BUPIVACAINE-EPINEPHRINE (PF) 0.25% -1:200000 IJ SOLN
INTRAMUSCULAR | Status: AC
  Filled 2017-11-15: qty 30

## 2017-11-15 MED ORDER — BUPIVACAINE-EPINEPHRINE 0.5% -1:200000 IJ SOLN
INTRAMUSCULAR | Status: AC
  Filled 2017-11-15: qty 1

## 2017-11-15 MED ORDER — ONDANSETRON HCL 4 MG/2ML IJ SOLN
INTRAMUSCULAR | Status: AC
  Filled 2017-11-15: qty 2

## 2017-11-15 MED ORDER — BUPIVACAINE-EPINEPHRINE 0.5% -1:200000 IJ SOLN
INTRAMUSCULAR | Status: DC | PRN
  Administered 2017-11-15: 20 mL

## 2017-11-15 MED ORDER — HYDROMORPHONE HCL 1 MG/ML IJ SOLN
1.0000 mg | INTRAMUSCULAR | Status: DC | PRN
  Administered 2017-11-15 – 2017-12-01 (×54): 1 mg via INTRAVENOUS
  Filled 2017-11-15 (×59): qty 1

## 2017-11-15 MED ORDER — 0.9 % SODIUM CHLORIDE (POUR BTL) OPTIME
TOPICAL | Status: DC | PRN
  Administered 2017-11-15: 1000 mL

## 2017-11-15 MED ORDER — LACTATED RINGERS IV SOLN
INTRAVENOUS | Status: DC
  Administered 2017-11-15 (×2): via INTRAVENOUS
  Administered 2017-11-22: 10 mL/h via INTRAVENOUS

## 2017-11-15 MED ORDER — SUGAMMADEX SODIUM 200 MG/2ML IV SOLN
INTRAVENOUS | Status: DC | PRN
  Administered 2017-11-15: 180 mg via INTRAVENOUS

## 2017-11-15 MED ORDER — FENTANYL CITRATE (PF) 100 MCG/2ML IJ SOLN
INTRAMUSCULAR | Status: DC | PRN
  Administered 2017-11-15 (×3): 50 ug via INTRAVENOUS

## 2017-11-15 MED ORDER — PIPERACILLIN-TAZOBACTAM 3.375 G IVPB
3.3750 g | Freq: Three times a day (TID) | INTRAVENOUS | Status: DC
  Administered 2017-11-15: 3.375 mg via INTRAVENOUS
  Administered 2017-11-15 – 2017-11-26 (×32): 3.375 g via INTRAVENOUS
  Filled 2017-11-15 (×27): qty 50

## 2017-11-15 MED ORDER — ROCURONIUM BROMIDE 50 MG/5ML IV SOSY
PREFILLED_SYRINGE | INTRAVENOUS | Status: DC | PRN
  Administered 2017-11-15: 30 mg via INTRAVENOUS
  Administered 2017-11-15: 10 mg via INTRAVENOUS

## 2017-11-15 MED ORDER — PHENYLEPHRINE 40 MCG/ML (10ML) SYRINGE FOR IV PUSH (FOR BLOOD PRESSURE SUPPORT)
PREFILLED_SYRINGE | INTRAVENOUS | Status: AC
  Filled 2017-11-15: qty 10

## 2017-11-15 SURGICAL SUPPLY — 48 items
BLADE CLIPPER SURG (BLADE) IMPLANT
BLADE SURG 10 STRL SS (BLADE) ×3 IMPLANT
BLADE SURG 15 STRL LF DISP TIS (BLADE) ×2 IMPLANT
BLADE SURG 15 STRL SS (BLADE) ×1
CHLORAPREP W/TINT 26ML (MISCELLANEOUS) ×3 IMPLANT
CONT SPEC 4OZ CLIKSEAL STRL BL (MISCELLANEOUS) ×3 IMPLANT
COVER SURGICAL LIGHT HANDLE (MISCELLANEOUS) ×3 IMPLANT
COVER WAND RF STERILE (DRAPES) IMPLANT
DERMABOND ADVANCED (GAUZE/BANDAGES/DRESSINGS) ×1
DERMABOND ADVANCED .7 DNX12 (GAUZE/BANDAGES/DRESSINGS) ×2 IMPLANT
DRAIN PENROSE 1/2X12 LTX STRL (WOUND CARE) ×3 IMPLANT
DRAPE LAPAROTOMY TRNSV 102X78 (DRAPE) ×3 IMPLANT
DRAPE UTILITY XL STRL (DRAPES) ×3 IMPLANT
ELECT CAUTERY BLADE 6.4 (BLADE) ×3 IMPLANT
ELECT REM PT RETURN 9FT ADLT (ELECTROSURGICAL) ×3
ELECTRODE REM PT RTRN 9FT ADLT (ELECTROSURGICAL) ×2 IMPLANT
GAUZE SPONGE 4X4 12PLY STRL LF (GAUZE/BANDAGES/DRESSINGS) ×3 IMPLANT
GLOVE SURG SIGNA 7.5 PF LTX (GLOVE) ×3 IMPLANT
GOWN STRL REUS W/ TWL LRG LVL3 (GOWN DISPOSABLE) ×2 IMPLANT
GOWN STRL REUS W/ TWL XL LVL3 (GOWN DISPOSABLE) ×2 IMPLANT
GOWN STRL REUS W/TWL LRG LVL3 (GOWN DISPOSABLE) ×1
GOWN STRL REUS W/TWL XL LVL3 (GOWN DISPOSABLE) ×1
KIT BASIN OR (CUSTOM PROCEDURE TRAY) ×3 IMPLANT
KIT TURNOVER KIT B (KITS) ×3 IMPLANT
LIGASURE IMPACT 36 18CM CVD LR (INSTRUMENTS) ×3 IMPLANT
MESH VICRYL KNITTED 12X12 (Mesh General) ×3 IMPLANT
NEEDLE HYPO 25GX1X1/2 BEV (NEEDLE) ×3 IMPLANT
NS IRRIG 1000ML POUR BTL (IV SOLUTION) ×3 IMPLANT
PACK SURGICAL SETUP 50X90 (CUSTOM PROCEDURE TRAY) ×3 IMPLANT
PAD ARMBOARD 7.5X6 YLW CONV (MISCELLANEOUS) ×3 IMPLANT
PENCIL BUTTON HOLSTER BLD 10FT (ELECTRODE) ×3 IMPLANT
RELOAD PROXIMATE 75MM BLUE (ENDOMECHANICALS) ×6 IMPLANT
SPONGE LAP 18X18 RF (DISPOSABLE) ×3 IMPLANT
SPONGE LAP 18X18 X RAY DECT (DISPOSABLE) ×3 IMPLANT
STAPLER GUN LINEAR PROX 60 (STAPLE) ×3 IMPLANT
STAPLER PROXIMATE 75MM BLUE (STAPLE) ×3 IMPLANT
STAPLER VISISTAT 35W (STAPLE) ×3 IMPLANT
SUT NOVA NAB DX-16 0-1 5-0 T12 (SUTURE) ×3 IMPLANT
SUT SILK 2 0 SH (SUTURE) IMPLANT
SUT SILK 2 0 SH CR/8 (SUTURE) ×6 IMPLANT
SUT VIC AB 2-0 CT1 27 (SUTURE) ×4
SUT VIC AB 2-0 CT1 TAPERPNT 27 (SUTURE) ×8 IMPLANT
SUT VIC AB 3-0 CT1 27 (SUTURE) ×1
SUT VIC AB 3-0 CT1 TAPERPNT 27 (SUTURE) ×2 IMPLANT
SYR CONTROL 10ML LL (SYRINGE) ×3 IMPLANT
TAPE CLOTH SURG 4X10 WHT LF (GAUZE/BANDAGES/DRESSINGS) ×3 IMPLANT
TOWEL OR 17X24 6PK STRL BLUE (TOWEL DISPOSABLE) ×3 IMPLANT
TOWEL OR 17X26 10 PK STRL BLUE (TOWEL DISPOSABLE) ×3 IMPLANT

## 2017-11-15 NOTE — Anesthesia Postprocedure Evaluation (Signed)
Anesthesia Post Note  Patient: Michael [name]  Procedure(s) Performed: LEFT INGUINAL HERNIA REPAIR WITH MESH (Left Groin) GROIN EXPLORATION WITH REMOVAL OF INGUINAL HERNIA MESH (Left Groin) SMALL BOWEL RESECTION (N/A Groin)     Patient location during evaluation: PACU Anesthesia Type: General Level of consciousness: awake and alert Pain management: pain level controlled Vital Signs Assessment: post-procedure vital signs reviewed and stable Respiratory status: spontaneous breathing, nonlabored ventilation, respiratory function stable and patient connected to nasal cannula oxygen Cardiovascular status: blood pressure returned to baseline and stable Postop Assessment: no apparent nausea or vomiting Anesthetic complications: no    Last Vitals:  Vitals:   11/15/17 1805 11/15/17 2127  BP: (!) 120/55 (!) 97/50  Pulse: 89 (!) 101  Resp: (!) 23   Temp: (!) 36.3 C 36.7 C  SpO2: 93% (!) 89%    Last Pain:  Vitals:   11/15/17 2127  TempSrc: Oral  PainSc:                  Breta Demedeiros COKER

## 2017-11-15 NOTE — Op Note (Signed)
Michael [name] 11/10/2017 - 11/15/2017   Pre-op Diagnosis: recurrent left inguinal hernia     Post-op Diagnosis: recurrent left inguinal hernia  Procedure(s): LEFT GROIN EXPLORATION REMOVAL OF MESH SMALL BOWEL RESECTION LEFT INGUINAL HERNIA REPAIR WITH MESH  Surgeon(s): Coralie Keens, MD  Anesthesia: General  Staff:  Circulator: Nicki Reaper, RN Scrub Person: Lyndle Herrlich, CST; Talati, Arica P, CST  Estimated Blood Loss: Minimal               Specimens: sent to path  Indications: This is an 84 year old gentleman who underwent an open repair of [name] chronically incarcerated left inguinal hernia last week.  This hernia went down into the scrotum and contained over [name] foot of small bowel.  The next morning postoperatively he had what appeared to be [name] postoperative hematoma and seroma.  He did not improve, however, [name] CT scan performed today show this was actually [name] recurrent hernia with small bowel.  The decision was made to proceed to the operating room  Findings: The patient was found to have about of foot of small bowel that it slipped underneath the mesh where [name] suture had broken.  There was [name] small area of stricturing of the small bowel did appear slightly ischemic therefore [name] small bowel resection was performed.  Procedure: The patient was brought to the operating room and identified as correct patient.  He was placed supine on the operating table and general anesthesia was induced.  His abdomen was then prepped and draped in usual sterile fashion.  I opened up his incision in the left inguinal area with [name] scalpel.  I then opened Scarpa's fascia by excising the sutures with this.  The patient was immediately found to have incarcerated small bowel that did come underneath the previously placed mesh there was repairing the inguinal floor.  The rest of the mesh was intact.  I had to remove the mesh.  I then eviscerated the small bowel further.  It was congested but viable  except for one small stricture.  The stricture appeared ischemic.  At this point it was decided to proceed with [name] small bowel resection.  I transected the small bowel proximal distal to the stricture with [name] GIA-75 stapler.  I then took down the mesentery with the LigaSure.  I then removed approximately 1 inch of small bowel.  I then reapproximated the remaining small bowel in [name] side-to-side fashion with silk sutures.  I performed 2 enterotomies in the created [name] side-to-side anastomosis with [name] GIA-75 stapler.  The opening was closed with [name] TA 60 stapler.  I then reinforced the staple line with interrupted silk sutures and closed the mesenteric defect with silk sutures.  [name] wide anastomosis which appeared well-perfused was achieved.  I then placed the small bowel back into the abdominal cavity.  Again, the patient had [name] was no inguinal floor.  I loosen up the transversalis fascia and then closed down to the remaining fascia at the inguinal ligament with figure-of-eight #1 Novafil sutures.  I then brought [name] piece of Vicryl mesh onto the field and placed in as an onlay on the inguinal floor and brought around the cord structures.  This was sutured in place with interrupted 2-0 Vicryl sutures.  I then thoroughly irrigated incision with saline.  Hemostasis appeared to be achieved.  I anesthetized the area with Marcaine.  I then closed Scarpa's fascia with interrupted 3-0 Vicryl sutures and closed the skin with staples.  The patient appeared to  tolerated procedure well.  He was then extubated in the operating room and taken in stable condition to the recovery room.  All counts were correct at the end of the procedure.          Michael [name]   Date: 11/15/2017  Time: 4:30 PM

## 2017-11-15 NOTE — Transfer of Care (Signed)
Immediate Anesthesia Transfer of Care Note  Patient: Michael [name]  Procedure(s) Performed: LEFT INGUINAL HERNIA REPAIR WITH MESH (Left Groin) GROIN EXPLORATION WITH REMOVAL OF INGUINAL HERNIA MESH (Left Groin) SMALL BOWEL RESECTION (N/A Groin)  Patient Location: PACU  Anesthesia Type:General  Level of Consciousness: awake, alert  and oriented  Airway & Oxygen Therapy: Patient Spontanous Breathing and Patient connected to nasal cannula oxygen  Post-op Assessment: Report given to RN  Post vital signs: Reviewed and stable  Last Vitals:  Vitals Value Taken Time  BP 126/54 11/15/2017  4:50 PM  Temp    Pulse 83 11/15/2017  4:51 PM  Resp 20 11/15/2017  4:51 PM  SpO2 88 % 11/15/2017  4:51 PM  Vitals shown include unvalidated device data.  Last Pain:  Vitals:   11/15/17 0427  TempSrc: Oral  PainSc:          Complications: No apparent anesthesia complications

## 2017-11-15 NOTE — Progress Notes (Signed)
Pt eating a lot of ice chips, advised to slow down on them but patient having sore throat and dry mouth.  Reminded staff that he was NPO except small amounts of ice. No nausea or vomiting reported.

## 2017-11-15 NOTE — Progress Notes (Signed)
5 Days Post-Op   Subjective/Chief Complaint: Events of weekend noted. Denies flatus.  Large amount out of NG Minimal pain   Objective: Vital signs in last 24 hours: Temp:  [98.9 F (37.2 C)-101.4 F (38.6 C)] 99.4 F (37.4 C) (10/21 0427) Pulse Rate:  [90-104] 90 (10/21 0427) Resp:  [13-16] 16 (10/21 0427) BP: (105-127)/(46-74) 127/74 (10/21 0427) SpO2:  [85 %-99 %] 99 % (10/21 0427) Last BM Date: 11/13/17  Intake/Output from previous day: 10/20 0701 - 10/21 0700 In: 2139.9 [P.O.:240; I.V.:1349.9; IV Piggyback:550] Out: 3491 [Urine:950; Emesis/NG output:4750] Intake/Output this shift: Total I/O In: 1086.7 [I.V.:586.7; IV Piggyback:500] Out: 2575 [Urine:625; Emesis/NG output:1950]  Exam: Abdomen soft, mildly tender, groin swelling slightly decreased, some erythema of incision Lungs clear  Lab Results:  Recent Labs    11/13/17 0303 11/14/17 0821  WBC 18.1* 21.2*  HGB 10.9* 11.3*  HCT 34.3* 36.3*  PLT 220 231   BMET Recent Labs    11/13/17 0303 11/14/17 0821  NA 140 142  K 4.0 3.7  CL 98 99  CO2 30 28  GLUCOSE 232* 269*  BUN 16 30*  CREATININE 1.03 1.82*  CALCIUM 9.8 9.3   PT/INR No results for input(s): LABPROT, INR in the last 72 hours. ABG No results for input(s): PHART, HCO3 in the last 72 hours.  Invalid input(s): PCO2, PO2  Studies/Results: Dg Abd 1 View  Result Date: 11/14/2017 CLINICAL DATA:  Status post NG tube placement. EXAM: ABDOMEN - 1 VIEW COMPARISON:  Single-view of the abdomen earlier today. FINDINGS: NG tube is in good position with both the tip and side-port in the stomach. IMPRESSION: As above. Electronically Signed   By: Inge Rise M.D.   On: 11/14/2017 13:25   Dg Abd 2 Views  Result Date: 11/14/2017 CLINICAL DATA:  84 year old male with a history of ileus EXAM: ABDOMEN - 2 VIEW COMPARISON:  CT 04/26/2017 FINDINGS: Gas within stomach, small bowel, colon. Air-fluid levels within the colon. No significant stool burden. No  radiopaque foreign body. IMPRESSION: Air-fluid levels within the colon and small bowel, may represent ileus versus early bowel obstruction. Electronically Signed   By: Corrie Mckusick D.O.   On: 11/14/2017 10:16    Anti-infectives: Anti-infectives (From admission, onward)   Start     Dose/Rate Route Frequency Ordered Stop   11/10/17 1210  ceFAZolin (ANCEF) 2-4 GM/100ML-% IVPB    Note to Pharmacy:  [place]   : cabinet override      11/10/17 1210 11/10/17 1451   11/10/17 1200  ceFAZolin (ANCEF) IVPB 2g/100 mL premix     2 g 200 mL/hr over 30 Minutes Intravenous On call to O.R. 11/10/17 1158 11/10/17 1501      Assessment/Plan: s/p Procedure(s): LEFT INGUINAL HERNIA REPAIR ERAS PATHWAY (Left) INSERTION OF MESH (Left)  Post op ileus vs SBO  HGB stable so this could be just seroma in the scrotum.  Will get a CT scan today to r/o bowel issue, recurrent hernia, hematoma. Will start IV antibiotics given elevated WBC and history of CLL    LOS: 1 day    Michael [name] A 11/15/2017

## 2017-11-15 NOTE — Progress Notes (Signed)
Pharmacy Antibiotic Note  Michael [name] is a 84 y.o. male admitted on 11/10/2017 with left inguinal hernia, now w/ wound infection.  Pharmacy has been consulted for Zosyn dosing.  Plan: Zosyn 3.375g IV q8h (4-hour infusion).   Temp (24hrs), Avg:100.2 F (37.9 C), Min:98.9 F (37.2 C), Max:101.4 F (38.6 C)  Recent Labs  Lab 11/13/17 0303 11/14/17 0821  WBC 18.1* 21.2*  CREATININE 1.03 1.82*    Estimated Creatinine Clearance: 30.9 mL/min (A) (by C-G formula based on SCr of 1.82 mg/dL (H)).    No Known Allergies   Thank you for allowing pharmacy to be a part of this patient's care.  Wynona Neat, PharmD, BCPS  11/15/2017 7:19 AM

## 2017-11-15 NOTE — Anesthesia Preprocedure Evaluation (Addendum)
Anesthesia Evaluation  Patient identified by MRN, date of birth, ID band Patient awake    Reviewed: Allergy & Precautions, NPO status , Patient's Chart, lab work & pertinent test results  Airway Mallampati: II  TM Distance: >3 FB Neck ROM: Full    Dental  (+) Teeth Intact, Dental Advisory Given,    Pulmonary    breath sounds clear to auscultation       Cardiovascular  Rhythm:Regular Rate:Normal     Neuro/Psych    GI/Hepatic   Endo/Other  diabetes  Renal/GU      Musculoskeletal   Abdominal   Peds  Hematology   Anesthesia Other Findings   Reproductive/Obstetrics                            Anesthesia Physical Anesthesia Plan  ASA: III  Anesthesia Plan: General   Post-op Pain Management:    Induction: Intravenous, Cricoid pressure planned and Rapid sequence  PONV Risk Score and Plan: Ondansetron  Airway Management Planned: Oral ETT  Additional Equipment:   Intra-op Plan:   Post-operative Plan: Extubation in OR  Informed Consent: I have reviewed the patients History and Physical, chart, labs and discussed the procedure including the risks, benefits and alternatives for the proposed anesthesia with the patient or authorized representative who has indicated his/her understanding and acceptance.   Dental advisory given  Plan Discussed with: CRNA and Anesthesiologist  Anesthesia Plan Comments:         Anesthesia Quick Evaluation

## 2017-11-15 NOTE — Plan of Care (Signed)
  Problem: Education: Goal: Knowledge of General Education information will improve Description: Including pain rating scale, medication(s)/side effects and non-pharmacologic comfort measures Outcome: Progressing   Problem: Activity: Goal: Risk for activity intolerance will decrease Outcome: Progressing   Problem: Coping: Goal: Level of anxiety will decrease Outcome: Progressing   

## 2017-11-15 NOTE — Progress Notes (Signed)
Patient ID: Michael [name], male   DOB: Apr 28, 1930, 84 y.o.   MRN: [id number]   Unfortunately, the patient's CT scan shows what appears to be a recurrence acutely of the left inguinal hernia with small bowel involvement and potential SBO. I explained this to the patient and his family in the room.  I need to take him back to the OR for emergent repair of the hernia.  I discussed the risks of bleeding, infection, need for bowel resection, etc.  He agrees to proceed.

## 2017-11-15 NOTE — Anesthesia Procedure Notes (Signed)
Procedure Name: Intubation Date/Time: 11/15/2017 3:36 PM Performed by: Barrington Ellison, CRNA Pre-anesthesia Checklist: Patient identified, Emergency Drugs available, Suction available and Patient being monitored Patient Re-evaluated:Patient Re-evaluated prior to induction Oxygen Delivery Method: Circle System Utilized Preoxygenation: Pre-oxygenation with 100% oxygen Induction Type: IV induction, Rapid sequence and Cricoid Pressure applied Laryngoscope Size: Mac and 4 Grade View: Grade I Tube type: Oral Number of attempts: 1 Airway Equipment and Method: Stylet and Oral airway Placement Confirmation: ETT inserted through vocal cords under direct vision,  positive ETCO2 and breath sounds checked- equal and bilateral Secured at: 22 cm Tube secured with: Tape Dental Injury: Teeth and Oropharynx as per pre-operative assessment

## 2017-11-16 ENCOUNTER — Encounter (HOSPITAL_COMMUNITY): Payer: Self-pay | Admitting: Surgery

## 2017-11-16 LAB — CBC
HEMATOCRIT: 35.5 % — AB (ref 39.0–52.0)
Hemoglobin: 10.5 g/dL — ABNORMAL LOW (ref 13.0–17.0)
MCH: 25.4 pg — AB (ref 26.0–34.0)
MCHC: 29.6 g/dL — ABNORMAL LOW (ref 30.0–36.0)
MCV: 86 fL (ref 80.0–100.0)
Platelets: 222 10*3/uL (ref 150–400)
RBC: 4.13 MIL/uL — ABNORMAL LOW (ref 4.22–5.81)
RDW: 17 % — AB (ref 11.5–15.5)
WBC: 16.5 10*3/uL — ABNORMAL HIGH (ref 4.0–10.5)
nRBC: 0.1 % (ref 0.0–0.2)

## 2017-11-16 LAB — BASIC METABOLIC PANEL
Anion gap: 11 (ref 5–15)
BUN: 43 mg/dL — ABNORMAL HIGH (ref 8–23)
CHLORIDE: 107 mmol/L (ref 98–111)
CO2: 27 mmol/L (ref 22–32)
CREATININE: 2.06 mg/dL — AB (ref 0.61–1.24)
Calcium: 8.5 mg/dL — ABNORMAL LOW (ref 8.9–10.3)
GFR calc Af Amer: 32 mL/min — ABNORMAL LOW (ref >60)
GFR calc non Af Amer: 28 mL/min — ABNORMAL LOW (ref >60)
GLUCOSE: 128 mg/dL — AB (ref 70–99)
Potassium: 4.9 mmol/L (ref 3.5–5.1)
Sodium: 145 mmol/L (ref 135–145)

## 2017-11-16 LAB — GLUCOSE, CAPILLARY
GLUCOSE-CAPILLARY: 126 mg/dL — AB (ref 70–99)
GLUCOSE-CAPILLARY: 152 mg/dL — AB (ref 70–99)
GLUCOSE-CAPILLARY: 139 mg/dL — AB (ref 70–99)
Glucose-Capillary: 115 mg/dL — ABNORMAL HIGH (ref 70–99)
Glucose-Capillary: 119 mg/dL — ABNORMAL HIGH (ref 70–99)
Glucose-Capillary: 133 mg/dL — ABNORMAL HIGH (ref 70–99)

## 2017-11-16 LAB — PATHOLOGIST SMEAR REVIEW

## 2017-11-16 MED ORDER — SODIUM CHLORIDE 0.9 % IV BOLUS
500.0000 mL | Freq: Once | INTRAVENOUS | Status: AC
  Administered 2017-11-16: 500 mL via INTRAVENOUS

## 2017-11-16 MED FILL — Piperacillin Sod-Tazobactam Sod in Dex IV Sol 3-0.375GM/50ML: INTRAVENOUS | Qty: 50 | Status: AC

## 2017-11-16 NOTE — Progress Notes (Signed)
1 Day Post-Op   Subjective/Chief Complaint: Comfortable this morning Denies SOB No flatus   Objective: Vital signs in last 24 hours: Temp:  [97.4 F (36.3 C)-98 F (36.7 C)] 98 F (36.7 C) (10/22 0525) Pulse Rate:  [83-101] 101 (10/22 0525) Resp:  [16-24] 16 (10/22 0525) BP: (97-126)/(50-58) 108/51 (10/22 0525) SpO2:  [88 %-98 %] 93 % (10/22 0525) Weight:  [88.5 kg] 88.5 kg (10/21 1509) Last BM Date: 11/13/17  Intake/Output from previous day: 10/21 0701 - 10/22 0700 In: 1906.4 [P.O.:360; I.V.:1429.1; IV Piggyback:117.3] Out: 4250 [Urine:1750; Emesis/NG output:2450; Blood:50] Intake/Output this shift: No intake/output data recorded.  Exam: Awake and alert Appears comfortable Lungs clear Abdomen soft, mildly full, no inguinal swelling  Lab Results:  Recent Labs    11/14/17 0821 11/16/17 0418  WBC 21.2* 16.5*  HGB 11.3* 10.5*  HCT 36.3* 35.5*  PLT 231 222   BMET Recent Labs    11/14/17 0821 11/16/17 0418  NA 142 145  K 3.7 4.9  CL 99 107  CO2 28 27  GLUCOSE 269* 128*  BUN 30* 43*  CREATININE 1.82* 2.06*  CALCIUM 9.3 8.5*   PT/INR No results for input(s): LABPROT, INR in the last 72 hours. ABG No results for input(s): PHART, HCO3 in the last 72 hours.  Invalid input(s): PCO2, PO2  Studies/Results: Ct Abdomen Pelvis Wo Contrast  Result Date: 11/15/2017 CLINICAL DATA:  Status post left inguinal hernia repair on 11/10/2017 with abdominal pain and apparent small-bowel obstruction EXAM: CT ABDOMEN AND PELVIS WITHOUT CONTRAST TECHNIQUE: Multidetector CT imaging of the abdomen and pelvis was performed following the standard protocol without IV contrast. COMPARISON:  None. FINDINGS: Lower chest: Lung bases demonstrate small bilateral pleural effusions. Additionally patchy bibasilar infiltrates are noted worst in the left lower lobe. Hepatobiliary: Gallbladder is decompressed. The liver is within normal limits. Pancreas: Unremarkable. No pancreatic ductal  dilatation or surrounding inflammatory changes. Spleen: Normal in size without focal abnormality. Adrenals/Urinary Tract: Adrenal glands are unremarkable. Kidneys are well visualized bilaterally. No obstructive changes are seen. No definitive ureteral stones are noted. The bladder is partially distended. Stomach/Bowel: Stomach is well distended with contrast material. Nasogastric catheter is noted extending into the stomach although the stomach is not decompressed. Question whether the nasogastric catheter is on adequate suction. Multiple dilated contrast filled loops of small bowel are identified. The dilated loops of small bowel extend inferiorly to the level of the left inguinal region and there is evidence of [name] recurrent left inguinal hernia extending into the scrotum. Multiple relatively decompressed small bowel loops are noted within. Postsurgical changes in the left inguinal region are seen. The previously placed mesh is not well appreciated although some hyperdense material is noted on the coronal imaging which may be related to the recently placed mesh. The hernia appears to be somewhat posterior and medial to this hyperdense material. The appendix is not well appreciated although no inflammatory changes to suggest appendicitis are seen. Vascular/Lymphatic: Aortic atherosclerosis. No enlarged abdominal or pelvic lymph nodes. Reproductive: Prostate is unremarkable. Other: No ascites is noted. Some fluid is noted adjacent to the hernia in the left inguinal region likely related to postoperative change. Musculoskeletal: Degenerative changes of lumbar spine are noted. IMPRESSION: Large recurrent left inguinal hernia with extension of multiple small bowel loops into the inguinal canal and scrotum on the left. Proximal small bowel dilatation is noted consistent with [name] degree of incarceration. Bilateral small effusions as well as patchy bibasilar infiltrates. These results will be called to the ordering  clinician  or representative by the Radiologist Assistant, and communication documented in the PACS or zVision Dashboard. Electronically Signed   By: Inez Catalina M.D.   On: 11/15/2017 14:01   Dg Abd 1 View  Result Date: 11/14/2017 CLINICAL DATA:  Status post NG tube placement. EXAM: ABDOMEN - 1 VIEW COMPARISON:  Single-view of the abdomen earlier today. FINDINGS: NG tube is in good position with both the tip and side-port in the stomach. IMPRESSION: As above. Electronically Signed   By: Inge Rise M.D.   On: 11/14/2017 13:25   Dg Abd 2 Views  Result Date: 11/14/2017 CLINICAL DATA:  84 year old male with [name] history of ileus EXAM: ABDOMEN - 2 VIEW COMPARISON:  CT 04/26/2017 FINDINGS: Gas within stomach, small bowel, colon. Air-fluid levels within the colon. No significant stool burden. No radiopaque foreign body. IMPRESSION: Air-fluid levels within the colon and small bowel, may represent ileus versus early bowel obstruction. Electronically Signed   By: Corrie Mckusick D.O.   On: 11/14/2017 10:16    Anti-infectives: Anti-infectives (From admission, onward)   Start     Dose/Rate Route Frequency Ordered Stop   11/15/17 1400  piperacillin-tazobactam (ZOSYN) IVPB 3.375 g     3.375 g 12.5 mL/hr over 240 Minutes Intravenous Every 8 hours 11/15/17 0718     11/15/17 0800  piperacillin-tazobactam (ZOSYN) IVPB 3.375 g     3.375 g 100 mL/hr over 30 Minutes Intravenous  Once 11/15/17 0718 11/15/17 0923   11/10/17 1210  ceFAZolin (ANCEF) 2-4 GM/100ML-% IVPB    Note to Pharmacy:  Michael [name], [place]   : cabinet override      11/10/17 1210 11/10/17 1451   11/10/17 1200  ceFAZolin (ANCEF) IVPB 2g/100 mL premix     2 g 200 mL/hr over 30 Minutes Intravenous On call to O.R. 11/10/17 1158 11/10/17 1501      Assessment/Plan: s/p Procedure(s): LEFT INGUINAL HERNIA REPAIR WITH MESH (Left) GROIN EXPLORATION WITH REMOVAL OF INGUINAL HERNIA MESH (Left) SMALL BOWEL RESECTION (N/[name])  With Cr up, will leave foley for close  monitoring of UOP Continue NG Repeat labs tomorrow  LOS: 2 days    Michael [name] 11/16/2017

## 2017-11-16 NOTE — Plan of Care (Signed)

## 2017-11-17 LAB — GLUCOSE, CAPILLARY
GLUCOSE-CAPILLARY: 156 mg/dL — AB (ref 70–99)
GLUCOSE-CAPILLARY: 140 mg/dL — AB (ref 70–99)
Glucose-Capillary: 107 mg/dL — ABNORMAL HIGH (ref 70–99)
Glucose-Capillary: 130 mg/dL — ABNORMAL HIGH (ref 70–99)
Glucose-Capillary: 142 mg/dL — ABNORMAL HIGH (ref 70–99)
Glucose-Capillary: 146 mg/dL — ABNORMAL HIGH (ref 70–99)

## 2017-11-17 LAB — BASIC METABOLIC PANEL
ANION GAP: 6 (ref 5–15)
BUN: 52 mg/dL — ABNORMAL HIGH (ref 8–23)
CO2: 28 mmol/L (ref 22–32)
Calcium: 8.3 mg/dL — ABNORMAL LOW (ref 8.9–10.3)
Chloride: 113 mmol/L — ABNORMAL HIGH (ref 98–111)
Creatinine, Ser: 1.68 mg/dL — ABNORMAL HIGH (ref 0.61–1.24)
GFR calc Af Amer: 41 mL/min — ABNORMAL LOW (ref >60)
GFR calc non Af Amer: 35 mL/min — ABNORMAL LOW (ref >60)
GLUCOSE: 143 mg/dL — AB (ref 70–99)
POTASSIUM: 4.2 mmol/L (ref 3.5–5.1)
Sodium: 147 mmol/L — ABNORMAL HIGH (ref 135–145)

## 2017-11-17 NOTE — Progress Notes (Signed)
2 Days Post-Op   Subjective/Chief Complaint: Reports abdominal pain, no flatus yet Still with moderate NG output but taking in a lot of ice   Objective: Vital signs in last 24 hours: Temp:  [97.7 F (36.5 C)-98.2 F (36.8 C)] 98.2 F (36.8 C) (10/23 0505) Pulse Rate:  [91-97] 97 (10/23 0505) Resp:  [16-18] 17 (10/23 0505) BP: (113-120)/(50-57) 113/57 (10/23 0505) SpO2:  [96 %-97 %] 96 % (10/23 0505) Last BM Date: 11/13/17  Intake/Output from previous day: 10/22 0701 - 10/23 0700 In: 1012.6 [P.O.:515; I.V.:332.1; IV Piggyback:165.5] Out: 5900 [Urine:2300; Emesis/NG output:3600] Intake/Output this shift: No intake/output data recorded.  Exam: Awake and alert Lungs clear Abdomen still distended, minimally tender, left  Groin stable Lab Results:  Recent Labs    11/16/17 0418  WBC 16.5*  HGB 10.5*  HCT 35.5*  PLT 222   BMET Recent Labs    11/16/17 0418 11/17/17 0237  NA 145 147*  K 4.9 4.2  CL 107 113*  CO2 27 28  GLUCOSE 128* 143*  BUN 43* 52*  CREATININE 2.06* 1.68*  CALCIUM 8.5* 8.3*   PT/INR No results for input(s): LABPROT, INR in the last 72 hours. ABG No results for input(s): PHART, HCO3 in the last 72 hours.  Invalid input(s): PCO2, PO2  Studies/Results: Ct Abdomen Pelvis Wo Contrast  Result Date: 11/15/2017 CLINICAL DATA:  Status post left inguinal hernia repair on 11/10/2017 with abdominal pain and apparent small-bowel obstruction EXAM: CT ABDOMEN AND PELVIS WITHOUT CONTRAST TECHNIQUE: Multidetector CT imaging of the abdomen and pelvis was performed following the standard protocol without IV contrast. COMPARISON:  None. FINDINGS: Lower chest: Lung bases demonstrate small bilateral pleural effusions. Additionally patchy bibasilar infiltrates are noted worst in the left lower lobe. Hepatobiliary: Gallbladder is decompressed. The liver is within normal limits. Pancreas: Unremarkable. No pancreatic ductal dilatation or surrounding inflammatory changes.  Spleen: Normal in size without focal abnormality. Adrenals/Urinary Tract: Adrenal glands are unremarkable. Kidneys are well visualized bilaterally. No obstructive changes are seen. No definitive ureteral stones are noted. The bladder is partially distended. Stomach/Bowel: Stomach is well distended with contrast material. Nasogastric catheter is noted extending into the stomach although the stomach is not decompressed. Question whether the nasogastric catheter is on adequate suction. Multiple dilated contrast filled loops of small bowel are identified. The dilated loops of small bowel extend inferiorly to the level of the left inguinal region and there is evidence of a recurrent left inguinal hernia extending into the scrotum. Multiple relatively decompressed small bowel loops are noted within. Postsurgical changes in the left inguinal region are seen. The previously placed mesh is not well appreciated although some hyperdense material is noted on the coronal imaging which may be related to the recently placed mesh. The hernia appears to be somewhat posterior and medial to this hyperdense material. The appendix is not well appreciated although no inflammatory changes to suggest appendicitis are seen. Vascular/Lymphatic: Aortic atherosclerosis. No enlarged abdominal or pelvic lymph nodes. Reproductive: Prostate is unremarkable. Other: No ascites is noted. Some fluid is noted adjacent to the hernia in the left inguinal region likely related to postoperative change. Musculoskeletal: Degenerative changes of lumbar spine are noted. IMPRESSION: Large recurrent left inguinal hernia with extension of multiple small bowel loops into the inguinal canal and scrotum on the left. Proximal small bowel dilatation is noted consistent with a degree of incarceration. Bilateral small effusions as well as patchy bibasilar infiltrates. These results will be called to the ordering clinician or representative by the  Psychologist, clinical,  and communication documented in the PACS or zVision Dashboard. Electronically Signed   By: Inez Catalina M.D.   On: 11/15/2017 14:01    Anti-infectives: Anti-infectives (From admission, onward)   Start     Dose/Rate Route Frequency Ordered Stop   11/15/17 1400  piperacillin-tazobactam (ZOSYN) IVPB 3.375 g     3.375 g 12.5 mL/hr over 240 Minutes Intravenous Every 8 hours 11/15/17 0718     11/15/17 0800  piperacillin-tazobactam (ZOSYN) IVPB 3.375 g     3.375 g 100 mL/hr over 30 Minutes Intravenous  Once 11/15/17 0718 11/15/17 0923   11/10/17 1210  ceFAZolin (ANCEF) 2-4 GM/100ML-% IVPB    Note to Pharmacy:  [name], [place]   : cabinet override      11/10/17 1210 11/10/17 1451   11/10/17 1200  ceFAZolin (ANCEF) IVPB 2g/100 mL premix     2 g 200 mL/hr over 30 Minutes Intravenous On call to O.R. 11/10/17 1158 11/10/17 1501      Assessment/Plan: s/p Procedure(s): LEFT INGUINAL HERNIA REPAIR WITH MESH (Left) GROIN EXPLORATION WITH REMOVAL OF INGUINAL HERNIA MESH (Left) SMALL BOWEL RESECTION (N/A)  Postop ileus.  Will continue NG Acute renal failure.  Cr improved.  Decrease IVF PT consult.  Needs to ambulate D/c foley If ileus persists, will place PICC and start TNA   LOS: 3 days    Javier Mamone A 11/17/2017

## 2017-11-17 NOTE — Progress Notes (Signed)
Rehab Admissions Coordinator Note:  Patient was screened by Cleatrice Burke for appropriateness for an Inpatient Acute Rehab Consult per PT recommendation.   At this time, we are recommending Inpatient Rehab consult if pt/family would like to be considered for admit. I have placed an order for OT eval to assist with planning dispo. Please place order for consult.  Danne Baxter, RN, MSN Rehab Admissions Coordinator [phone] 11/17/2017 1:38 PM

## 2017-11-17 NOTE — Evaluation (Addendum)
Physical Therapy Evaluation Patient Details Name: Michael [name] MRN: [id number] DOB: 1930-05-20 Today's Date: 11/17/2017   History of Present Illness  Pt is a 84 y.o. M with significant PMH of CLL who presents s/p left inguinal hernia repair with mesh and small bowel resection  Clinical Impression  Pt admitted with above diagnosis. Pt currently with functional limitations due to the deficits listed below (see PT Problem List). Prior to admission, patient lives alone with family support and is independent with mobility using a cane. He reports being active and enjoys doing yardwork. Currently, he presents with decreased functional mobility secondary to generalized weakness/deconditioning, pain, decreased activity tolerance, and balance impairments. Requiring minimal assistance for limited functional mobility. Will likely have improved balance using walker. Based on PLOF and motivation, recommending CIR to maximize functional independence.  Pt will benefit from skilled PT to increase their independence and safety with mobility to allow discharge to the venue listed below.       Follow Up Recommendations CIR    Equipment Recommendations  None recommended by PT    Recommendations for Other Services Rehab consult;OT consult     Precautions / Restrictions Precautions Precautions: Fall Precaution Comments: NG tube Restrictions Weight Bearing Restrictions: No      Mobility  Bed Mobility Overal bed mobility: Needs Assistance Bed Mobility: Supine to Sit     Supine to sit: Min assist     General bed mobility comments: Light min assist with HOB elevated and use of bed rails. Increased time and effort  Transfers Overall transfer level: Needs assistance Equipment used: None Transfers: Sit to/from [facility] Sit to Stand: Min assist         General transfer comment: Min assist for initiation to transfer to standing and for steadying with stand pivot from bed to  chair. Exhibiting moderate shakiness and unsteadiness. Patient reaching for chair arm for external support  Ambulation/Gait                Stairs            Wheelchair Mobility    Modified Rankin (Stroke Patients Only)       Balance Overall balance assessment: Needs assistance Sitting-balance support: No upper extremity supported;Feet supported Sitting balance-Leahy Scale: Good     Standing balance support: No upper extremity supported;During functional activity Standing balance-Leahy Scale: Fair                               Pertinent Vitals/Pain Pain Assessment: Faces Faces Pain Scale: Hurts even more Pain Location: generalized, abdominal Pain Descriptors / Indicators: Grimacing;Guarding Pain Intervention(s): Monitored during session;Limited activity within patient's tolerance    Home Living Family/patient expects to be discharged to:: Private residence Living Arrangements: Alone Available Help at Discharge: Family;Available PRN/intermittently Type of Home: House Home Access: Ramped entrance     Home Layout: One level Home Equipment: Grab bars - tub/shower;Shower seat;Walker - 4 wheels;Walker - 2 wheels      Prior Function Level of Independence: Independent with assistive device(s)         Comments: Used cane     Hand Dominance        Extremity/Trunk Assessment   Upper Extremity Assessment Upper Extremity Assessment: Generalized weakness    Lower Extremity Assessment Lower Extremity Assessment: Generalized weakness       Communication   Communication: No difficulties  Cognition Arousal/Alertness: Awake/alert Behavior During Therapy: WFL for tasks assessed/performed Overall Cognitive  Status: Within Functional Limits for tasks assessed                                        General Comments      Exercises General Exercises - Lower Extremity Ankle Circles/Pumps: 20 reps;Both;Supine Quad Sets: 20  reps;Both;Supine   Assessment/Plan    PT Assessment Patient needs continued PT services  PT Problem List Decreased strength;Decreased activity tolerance;Decreased balance;Decreased mobility;Pain       PT Treatment Interventions Gait training;Functional mobility training;Therapeutic activities;Therapeutic exercise;DME instruction;Balance training;Patient/family education    PT Goals (Current goals can be found in the Care Plan section)  Acute Rehab PT Goals Patient Stated Goal: "go to rehab if I need to." PT Goal Formulation: With patient Time For Goal Achievement: 12/01/17 Potential to Achieve Goals: Good    Frequency Min 3X/week   Barriers to discharge        Co-evaluation               AM-PAC PT "6 Clicks" Daily Activity  Outcome Measure Difficulty turning over in bed (including adjusting bedclothes, sheets and blankets)?: A Lot Difficulty moving from lying on back to sitting on the side of the bed? : Unable Difficulty sitting down on and standing up from a chair with arms (e.g., wheelchair, bedside commode, etc,.)?: Unable Help needed moving to and from a bed to chair (including a wheelchair)?: A Little Help needed walking in hospital room?: A Little Help needed climbing 3-5 steps with a railing? : A Lot 6 Click Score: 12    End of Session Equipment Utilized During Treatment: Gait belt;Oxygen Activity Tolerance: Patient tolerated treatment well Patient left: in chair;with call bell/phone within reach Nurse Communication: Mobility status PT Visit Diagnosis: Unsteadiness on feet (R26.81);Muscle weakness (generalized) (M62.81);Difficulty in walking, not elsewhere classified (R26.2);Pain Pain - part of body: (abdominal)    Time: 1610-9604 PT Time Calculation (min) (ACUTE ONLY): 35 min   Charges:   PT Evaluation $PT Eval Moderate Complexity: 1 Mod PT Treatments $Therapeutic Activity: 8-22 mins        Ellamae Sia, PT, DPT Acute Rehabilitation  Services Pager [phone] Office [phone]   Willy Eddy 11/17/2017, 1:32 PM

## 2017-11-17 NOTE — Care Management Important Message (Signed)
Important Message  Patient Details  Name: Michael [name] MRN: [id number] Date of Birth: Jun 04, 1930   Medicare Important Message Given:  Yes    Orbie Pyo 11/17/2017, 2:13 PM

## 2017-11-18 DIAGNOSIS — K567 Ileus, unspecified: Secondary | ICD-10-CM

## 2017-11-18 DIAGNOSIS — R5381 Other malaise: Secondary | ICD-10-CM

## 2017-11-18 DIAGNOSIS — Z8719 Personal history of other diseases of the digestive system: Secondary | ICD-10-CM

## 2017-11-18 DIAGNOSIS — Z9889 Other specified postprocedural states: Secondary | ICD-10-CM

## 2017-11-18 LAB — CBC
HCT: 33.2 % — ABNORMAL LOW (ref 39.0–52.0)
Hemoglobin: 10 g/dL — ABNORMAL LOW (ref 13.0–17.0)
MCH: 26.4 pg (ref 26.0–34.0)
MCHC: 30.1 g/dL (ref 30.0–36.0)
MCV: 87.6 fL (ref 80.0–100.0)
PLATELETS: 230 10*3/uL (ref 150–400)
RBC: 3.79 MIL/uL — ABNORMAL LOW (ref 4.22–5.81)
RDW: 17.2 % — AB (ref 11.5–15.5)
WBC: 16 10*3/uL — AB (ref 4.0–10.5)
nRBC: 0 % (ref 0.0–0.2)

## 2017-11-18 LAB — BASIC METABOLIC PANEL
Anion gap: 6 (ref 5–15)
BUN: 50 mg/dL — AB (ref 8–23)
CALCIUM: 8.3 mg/dL — AB (ref 8.9–10.3)
CO2: 24 mmol/L (ref 22–32)
CREATININE: 1.67 mg/dL — AB (ref 0.61–1.24)
Chloride: 122 mmol/L — ABNORMAL HIGH (ref 98–111)
GFR calc Af Amer: 41 mL/min — ABNORMAL LOW (ref >60)
GFR, EST NON AFRICAN AMERICAN: 36 mL/min — AB (ref >60)
Glucose, Bld: 173 mg/dL — ABNORMAL HIGH (ref 70–99)
Potassium: 4.4 mmol/L (ref 3.5–5.1)
SODIUM: 152 mmol/L — AB (ref 135–145)

## 2017-11-18 LAB — GLUCOSE, CAPILLARY
GLUCOSE-CAPILLARY: 120 mg/dL — AB (ref 70–99)
GLUCOSE-CAPILLARY: 159 mg/dL — AB (ref 70–99)
Glucose-Capillary: 134 mg/dL — ABNORMAL HIGH (ref 70–99)
Glucose-Capillary: 150 mg/dL — ABNORMAL HIGH (ref 70–99)
Glucose-Capillary: 169 mg/dL — ABNORMAL HIGH (ref 70–99)
Glucose-Capillary: 227 mg/dL — ABNORMAL HIGH (ref 70–99)

## 2017-11-18 MED ORDER — SODIUM CHLORIDE 0.45 % IV SOLN
INTRAVENOUS | Status: DC
  Administered 2017-11-18 – 2017-11-20 (×4): via INTRAVENOUS

## 2017-11-18 NOTE — NC FL2 (Signed)
MEDICAID FL2 LEVEL OF CARE SCREENING TOOL     IDENTIFICATION  Patient Name: Michael [name] Birthdate: May 15, 1930 Sex: male Admission Date (Current Location): 11/10/2017  [facility] and [place] Number:  Herbalist and Address:  [facility], [place] [address] [place], [place] [address]      Provider Number: [id number]  Attending Physician Name and Address:  Coralie Keens, MD  Relative Name and Phone Number:  Darci Needle, daughter, [phone]    Current Level of Care: Hospital Recommended Level of Care: [place] Prior Approval Number:    Date Approved/Denied:   PASRR Number: [id number]  Discharge Plan: SNF    Current Diagnoses: Patient Active Problem List   Diagnosis Date Noted  . Ileus ([place]) 11/14/2017  . S/P left inguinal hernia repair 11/10/2017  . Weight loss, non-intentional 04/12/2017  . Essential hypertension 10/20/2016  . Paresthesias 10/19/2016  . Left inguinal hernia 10/12/2016  . Protein-calorie malnutrition, mild ([place]) 01/10/2016  . Diarrhea due to drug 06/17/2015  . Chronic left shoulder pain 06/17/2015  . Bilateral leg edema 06/17/2015  . Pancytopenia, acquired ([place]) 04/22/2015  . Thrombocytopenia ([place]) 10/11/2014  . 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 CLL (chronic lymphocytic leukemia) ([place]) 12/08/2010    Orientation RESPIRATION BLADDER Height & Weight     Self, Time, Situation, Place  O2(nasal canula 2L) Continent Weight: 195 lb 1.6 oz (88.5 kg) Height:  5' 5.98" (167.6 cm)  BEHAVIORAL SYMPTOMS/MOOD NEUROLOGICAL BOWEL NUTRITION STATUS      Continent Diet(see discharge summary)  AMBULATORY STATUS COMMUNICATION OF NEEDS Skin   Extensive Assist Verbally Surgical wounds(incision on left groin with gauze and tape dressing)                       Personal Care Assistance Level of Assistance  Bathing, Feeding, Dressing Bathing Assistance: Maximum assistance Feeding  assistance: Limited assistance Dressing Assistance: Maximum assistance     Functional Limitations Info  Speech, Hearing, Sight Sight Info: Adequate Hearing Info: Adequate Speech Info: Adequate    SPECIAL CARE FACTORS FREQUENCY  PT (By licensed PT), OT (By licensed OT)     PT Frequency: 5x week OT Frequency: 5x week            Contractures Contractures Info: Not present    Additional Factors Info  Code Status, Allergies, Insulin Sliding Scale Code Status Info: Full Code Allergies Info: No Known Allergies   Insulin Sliding Scale Info: insulin aspart (novoLOG) injection 0-15 Units every 4 hours       Current Medications (11/18/2017):  This is the current hospital active medication list Current Facility-Administered Medications  Medication Dose Route Frequency Provider Last Rate Last Dose  . 0.45 % sodium chloride infusion   Intravenous Continuous Coralie Keens, MD 50 mL/hr at 11/18/17 [phone]    . acetaminophen (TYLENOL) tablet 650 mg  650 mg Per Tube Q6H PRN Coralie Keens, MD   650 mg at 11/15/17 0336  . alum & mag hydroxide-simeth (MAALOX/MYLANTA) 200-200-20 MG/5ML suspension 30 mL  30 mL Oral Q4H PRN Coralie Keens, MD   30 mL at 11/13/17 2121  . diphenhydrAMINE (BENADRYL) 12.5 MG/5ML elixir 12.5 mg  12.5 mg Oral Q6H PRN Coralie Keens, MD       Or  . diphenhydrAMINE (BENADRYL) injection 12.5 mg  12.5 mg Intravenous Q6H PRN Coralie Keens, MD      . famotidine (PEPCID) IVPB 20 mg premix  20 mg Intravenous Daily Coralie Keens,  MD 100 mL/hr at 11/18/17 1035 20 mg at 11/18/17 1035  . furosemide (LASIX) injection 20 mg  20 mg Intravenous Daily Coralie Keens, MD   20 mg at 11/18/17 1028  . HYDROmorphone (DILAUDID) injection 1 mg  1 mg Intravenous Q1H PRN Coralie Keens, MD   1 mg at 11/18/17 1030  . insulin aspart (novoLOG) injection 0-15 Units  0-15 Units Subcutaneous Q4H Coralie Keens, MD   2 Units at 11/18/17 0827  . lactated ringers infusion    Intravenous Continuous Coralie Keens, MD 10 mL/hr at 11/15/17 1512    . MEDLINE mouth rinse  15 mL Mouth Rinse BID Coralie Keens, MD   15 mL at 11/18/17 1035  . ondansetron (ZOFRAN-ODT) disintegrating tablet 4 mg  4 mg Oral Q6H PRN Coralie Keens, MD       Or  . ondansetron [facility]) injection 4 mg  4 mg Intravenous Q6H PRN Coralie Keens, MD   4 mg at 11/14/17 0932  . oxyCODONE (Oxy IR/ROXICODONE) immediate release tablet 5-10 mg  5-10 mg Oral Q4H PRN Coralie Keens, MD   10 mg at 11/18/17 1149  . phenol (CHLORASEPTIC) mouth spray 1 spray  1 spray Mouth/Throat PRN Coralie Keens, MD   1 spray at 11/14/17 1814  . piperacillin-tazobactam (ZOSYN) IVPB 3.375 g  3.375 g Intravenous Q8H Coralie Keens, MD 12.5 mL/hr at 11/18/17 [phone]    . simethicone (MYLICON) 40 IR/4.4RX suspension 80 mg  80 mg Oral Q6H PRN Coralie Keens, MD   80 mg at 11/13/17 2251  . tamsulosin (FLOMAX) capsule 0.4 mg  0.4 mg Oral QPC supper Coralie Keens, MD   0.4 mg at 11/17/17 1812  . traMADol (ULTRAM) tablet 50 mg  50 mg Oral Q6H PRN Coralie Keens, MD   50 mg at 11/12/17 1959     Discharge Medications: Please see discharge summary for a list of discharge medications.  Relevant Imaging Results:  Relevant Lab Results:   Additional Information SS#[id number] [place], [place]

## 2017-11-18 NOTE — Progress Notes (Signed)
Patient complaining of pain over entire body. Yells out when legs/ arms are just touched. Complaining of pain specifically in neck. Dr. Ninfa Linden made aware.

## 2017-11-18 NOTE — Progress Notes (Signed)
Inpatient Rehabilitation-Admissions Coordinator   Met with patient at the bedside as follow up from PM&R consult. AC discussed recommendations for rehab venue. Pt and family are now aware that the pt is not a candidate for CIR. AC has contacted SW regarding recommendation. CIR will sign off at this time. Please call for questions.   Jhonnie Garner, OTR/L  Rehab Admissions Coordinator  [phone] 11/18/2017 3:52 PM

## 2017-11-18 NOTE — Care Management Note (Signed)
Case Management Note  Patient Details  Name: Michael [name] MRN: [id number] Date of Birth: 1930/06/04  Subjective/Objective:                    Action/Plan:  PT recommending CIR. Received order for inpatient  rehab MD consult.  Will continue to follow. Expected Discharge Date:  11/12/17               Expected Discharge Plan:  [place]  In-House Referral:     Discharge planning Services  CM Consult  Post Acute Care Choice:    Choice offered to:     DME Arranged:    DME Agency:     HH Arranged:    HH Agency:     Status of Service:  In process, will continue to follow  If discussed at Long Length of Stay Meetings, dates discussed:    Additional Comments:  Marilu Favre, RN 11/18/2017, 9:50 AM

## 2017-11-18 NOTE — Evaluation (Signed)
Occupational Therapy Evaluation Patient Details Name: Michael [name] MRN: [id number] DOB: 20-Feb-1930 Today's Date: 11/18/2017    History of Present Illness Pt is a 84 y.o. M with significant PMH of CLL who presents s/p left inguinal hernia repair with mesh and small bowel resection   Clinical Impression   Pt admitted with the above diagnoses and presents with below problem list. Pt will benefit from continued acute OT to address the below listed deficits and maximize independence with basic ADLs prior to d/c to venue below. PTA pt was mod I with ADLs. Pt presents with impaired balance, decreased cognition, generalized weakness, and reports of generalized pain impacting level of assist with ADLs and functional mobility. Pt is currently min to max A with ADLs. Daughter-in-law present towards end of session.      Follow Up Recommendations  CIR;Supervision/Assistance - 24 hour    Equipment Recommendations  Other (comment)(defer to next venue)    Recommendations for Other Services       Precautions / Restrictions Precautions Precautions: Fall Restrictions Weight Bearing Restrictions: No      Mobility Bed Mobility Overal bed mobility: Needs Assistance Bed Mobility: Supine to Sit     Supine to sit: Mod assist;HOB elevated     General bed mobility comments: Assist to powerup trunk  Transfers Overall transfer level: Needs assistance Equipment used: Rolling walker (2 wheeled) Transfers: Sit to/from [facility] Sit to Stand: Mod assist Stand pivot transfers: Min assist       General transfer comment: Pt prefers to pull up to standing. Assist to block rw and powerup to full standing position.    Balance Overall balance assessment: Needs assistance Sitting-balance support: No upper extremity supported;Feet supported Sitting balance-Leahy Scale: Good     Standing balance support: No upper extremity supported;During functional activity Standing  balance-Leahy Scale: Poor                             ADL either performed or assessed with clinical judgement   ADL Overall ADL's : Needs assistance/impaired Eating/Feeding: Set up;Sitting   Grooming: Sitting;Minimal assistance   Upper Body Bathing: Minimal assistance;Sitting   Lower Body Bathing: Maximal assistance;Sit to/from stand   Upper Body Dressing : Minimal assistance;Sitting   Lower Body Dressing: Maximal assistance   Toilet Transfer: Stand-pivot;Minimal assistance;BSC;RW   Toileting- Clothing Manipulation and Hygiene: Moderate assistance;Maximal assistance;Sit to/from stand   Tub/ Shower Transfer: Minimal assistance;Stand-pivot;3 in 1;Rolling walker     General ADL Comments: Pt completed bed mobility and SPT to recliner. Limited by pain this session,.     Vision         Perception     Praxis      Pertinent Vitals/Pain Faces Pain Scale: Hurts whole lot Pain Location: generalized, posterior neck Pain Descriptors / Indicators: Grimacing;Guarding Pain Intervention(s): Limited activity within patient's tolerance;Monitored during session;Repositioned     Hand Dominance Right   Extremity/Trunk Assessment Upper Extremity Assessment Upper Extremity Assessment: Generalized weakness   Lower Extremity Assessment Lower Extremity Assessment: Generalized weakness   Cervical / Trunk Assessment Cervical / Trunk Assessment: Kyphotic   Communication Communication Communication: No difficulties   Cognition Arousal/Alertness: Awake/alert Behavior During Therapy: Agitated Overall Cognitive Status: Impaired/Different from baseline Area of Impairment: Orientation;Attention;Memory;Following commands;Safety/judgement;Awareness;Problem solving                 Orientation Level: Place;Time;Situation Current Attention Level: Sustained Memory: Decreased recall of precautions;Decreased short-term memory Following Commands: Follows one step  commands  with increased time;Follows multi-step commands inconsistently Safety/Judgement: Decreased awareness of safety;Decreased awareness of deficits Awareness: Intellectual Problem Solving: Slow processing;Difficulty sequencing;Requires verbal cues General Comments: Initially stated place as "your house", year as "1985."     General Comments       Exercises     Shoulder Instructions      Home Living Family/patient expects to be discharged to:: Private residence Living Arrangements: Alone Available Help at Discharge: Family;Available PRN/intermittently Type of Home: House Home Access: Ramped entrance     Home Layout: One level     Bathroom Shower/Tub: Walk-in shower         Home Equipment: Grab bars - tub/shower;Shower seat;Walker - 4 wheels;Walker - 2 wheels          Prior Functioning/Environment Level of Independence: Independent with assistive device(s)        Comments: Used cane        OT Problem List: Decreased strength;Decreased activity tolerance;Impaired balance (sitting and/or standing);Decreased cognition;Decreased knowledge of use of DME or AE;Decreased knowledge of precautions;Pain      OT Treatment/Interventions: Self-care/ADL training;DME and/or AE instruction;Therapeutic activities;Cognitive remediation/compensation;Patient/family education;Balance training;Therapeutic exercise    OT Goals(Current goals can be found in the care plan section) Acute Rehab OT Goals Patient Stated Goal: "go to rehab if I need to." OT Goal Formulation: With patient/family Time For Goal Achievement: 12/02/17 Potential to Achieve Goals: Good ADL Goals Pt Will Perform Grooming: with modified independence;sitting;standing Pt Will Perform Lower Body Bathing: with modified independence;sit to/from stand Pt Will Perform Lower Body Dressing: with modified independence;sit to/from stand Pt Will Transfer to Toilet: with modified independence;ambulating Pt Will Perform Toileting -  Clothing Manipulation and hygiene: with modified independence;sit to/from stand  OT Frequency: Min 2X/week   Barriers to D/C:            Co-evaluation              AM-PAC PT "6 Clicks" Daily Activity     Outcome Measure Help from another person eating meals?: None Help from another person taking care of personal grooming?: A Little Help from another person toileting, which includes using toliet, bedpan, or urinal?: A Lot Help from another person bathing (including washing, rinsing, drying)?: A Lot Help from another person to put on and taking off regular upper body clothing?: A Little Help from another person to put on and taking off regular lower body clothing?: A Lot 6 Click Score: 16   End of Session Equipment Utilized During Treatment: Gait belt;Rolling walker;Oxygen Nurse Communication: Other (comment)(cognition)  Activity Tolerance: Patient limited by pain;Patient limited by fatigue Patient left: in chair;with call bell/phone within reach;with family/visitor present  OT Visit Diagnosis: Unsteadiness on feet (R26.81);Pain;Muscle weakness (generalized) (M62.81);Other symptoms and signs involving cognitive function                Time: 1116-1145 OT Time Calculation (min): 29 min Charges:  OT General Charges $OT Visit: 1 Visit OT Evaluation $OT Eval Low Complexity: 1 Low OT Treatments $Self Care/Home Management : 8-22 mins  Tyrone Schimke, OT Acute Rehabilitation Services Pager: [phone] Office: [phone]   Hortencia Pilar 11/18/2017, 12:12 PM

## 2017-11-18 NOTE — Progress Notes (Addendum)
Physical Therapy Treatment Patient Details Name: Michael [name] MRN: [id number] DOB: 08/13/1930 Today's Date: 11/18/2017    History of Present Illness Pt is a 84 y.o. M with significant PMH of CLL who presents s/p left inguinal hernia repair with mesh and small bowel resection    PT Comments    Patient with noticeable, significant decline in cognition, motivation/participation, and mobility compared to yesterday's physical therapy session. Session limited by patient generalized pain and fatigue. Patient yelling and visibly upset with slightest movement causing pain (I.e. Removing blanket from his lap). Pt did not recall therapist visit from yesterday. Unable to initiate standing from chair despite multiple attempts and increased time/effort because patient unable to lean forward and did not allow therapist to assist. Focused on chair level therapeutic exercises for strengthening/range of motion. RN assisted PT reposition patient in chair. Updated discharge plan to SNF.     Follow Up Recommendations  SNF     Equipment Recommendations  None recommended by PT    Recommendations for Other Services       Precautions / Restrictions Precautions Precautions: Fall Restrictions Weight Bearing Restrictions: No    Mobility  Bed Mobility Overal bed mobility: Needs Assistance Bed Mobility: Supine to Sit     Supine to sit: Mod assist;HOB elevated     General bed mobility comments: OOB in recliner  Transfers Overall transfer level: Needs assistance Equipment used: Rolling walker (2 wheeled) Transfers: Sit to/from [facility] Sit to Stand: Mod assist Stand pivot transfers: Min assist       General transfer comment: Unable secondary to pain  Ambulation/Gait                 Stairs             Wheelchair Mobility    Modified Rankin (Stroke Patients Only)       Balance Overall balance assessment: Needs assistance Sitting-balance support: No  upper extremity supported;Feet supported Sitting balance-Leahy Scale: Good     Standing balance support: No upper extremity supported;During functional activity Standing balance-Leahy Scale: Poor                              Cognition Arousal/Alertness: Awake/alert Behavior During Therapy: Agitated Overall Cognitive Status: Impaired/Different from baseline Area of Impairment: Orientation;Attention;Memory;Following commands;Safety/judgement;Awareness;Problem solving                 Orientation Level: Place;Time;Situation Current Attention Level: Sustained Memory: Decreased recall of precautions;Decreased short-term memory Following Commands: Follows one step commands with increased time;Follows multi-step commands inconsistently Safety/Judgement: Decreased awareness of safety;Decreased awareness of deficits Awareness: Intellectual Problem Solving: Slow processing;Difficulty sequencing;Requires verbal cues General Comments: Initially stated place as "your house", year as "1985."        Exercises General Exercises - Upper Extremity Shoulder Flexion: 5 reps;Both;Seated Elbow Flexion: 10 reps;Both;Seated General Exercises - Lower Extremity Long Arc Quad: Both;Seated;20 reps Hip Flexion/Marching: 20 reps;Both;Seated Other Exercises Other Exercises: Seated core stabilization exercise x 30 s holds    General Comments        Pertinent Vitals/Pain Pain Assessment: Faces Faces Pain Scale: Hurts whole lot Pain Location: generalized, posterior neck Pain Descriptors / Indicators: Grimacing;Guarding;Moaning Pain Intervention(s): Limited activity within patient's tolerance;Other (comment)(RN aware)    Home Living Family/patient expects to be discharged to:: Private residence Living Arrangements: Alone Available Help at Discharge: Family;Available PRN/intermittently Type of Home: House Home Access: Ramped entrance   Home Layout: One level Home Equipment:  Grab  bars - tub/shower;Shower seat;Walker - 4 wheels;Walker - 2 wheels      Prior Function Level of Independence: Independent with assistive device(s)      Comments: Used cane   PT Goals (current goals can now be found in the care plan section) Acute Rehab PT Goals Patient Stated Goal: "go to rehab if I need to." Potential to Achieve Goals: Good Progress towards PT goals: Not progressing toward goals - comment(limited by pain)    Frequency    Min 2X/week      PT Plan Discharge plan needs to be updated;Frequency needs to be updated    Co-evaluation              AM-PAC PT "6 Clicks" Daily Activity  Outcome Measure  Difficulty turning over in bed (including adjusting bedclothes, sheets and blankets)?: A Lot Difficulty moving from lying on back to sitting on the side of the bed? : Unable Difficulty sitting down on and standing up from a chair with arms (e.g., wheelchair, bedside commode, etc,.)?: Unable Help needed moving to and from a bed to chair (including a wheelchair)?: A Little Help needed walking in hospital room?: A Little Help needed climbing 3-5 steps with a railing? : A Lot 6 Click Score: 12    End of Session Equipment Utilized During Treatment: Gait belt;Oxygen Activity Tolerance: Patient limited by pain;Patient limited by fatigue Patient left: in chair;with call bell/phone within reach Nurse Communication: Mobility status PT Visit Diagnosis: Unsteadiness on feet (R26.81);Muscle weakness (generalized) (M62.81);Difficulty in walking, not elsewhere classified (R26.2);Pain     Time: 8756-4332 PT Time Calculation (min) (ACUTE ONLY): 37 min  Charges:  $Therapeutic Exercise: 8-22 mins $Therapeutic Activity: 8-22 mins                    Ellamae Sia, PT, DPT Acute Rehabilitation Services Pager [phone] Office [phone]    Willy Eddy 11/18/2017, 3:06 PM

## 2017-11-18 NOTE — Progress Notes (Signed)
3 Days Post-Op   Subjective/Chief Complaint: Reports passing flatus Denies SOB   Objective: Vital signs in last 24 hours: Temp:  [98.2 F (36.8 C)-99.3 F (37.4 C)] 99.3 F (37.4 C) (10/24 0411) Pulse Rate:  [83-93] 88 (10/24 0411) Resp:  [16-18] 16 (10/24 0411) BP: (93-117)/(50-58) 105/53 (10/24 0411) SpO2:  [96 %-100 %] 96 % (10/24 0411) Last BM Date: 11/13/17  Intake/Output from previous day: 10/23 0701 - 10/24 0700 In: 2545.6 [I.V.:2272.8; IV Piggyback:272.8] Out: 2600 [Urine:450; Emesis/NG output:2150] Intake/Output this shift: No intake/output data recorded.  Exam: Awake and alert Lungs clear CV RRR Abdomen softer, +BS  Lab Results:  Recent Labs    11/16/17 0418 11/18/17 0355  WBC 16.5* 16.0*  HGB 10.5* 10.0*  HCT 35.5* 33.2*  PLT 222 230   BMET Recent Labs    11/17/17 0237 11/18/17 0355  NA 147* 152*  K 4.2 4.4  CL 113* 122*  CO2 28 24  GLUCOSE 143* 173*  BUN 52* 50*  CREATININE 1.68* 1.67*  CALCIUM 8.3* 8.3*   PT/INR No results for input(s): LABPROT, INR in the last 72 hours. ABG No results for input(s): PHART, HCO3 in the last 72 hours.  Invalid input(s): PCO2, PO2  Studies/Results: No results found.  Anti-infectives: Anti-infectives (From admission, onward)   Start     Dose/Rate Route Frequency Ordered Stop   11/15/17 1400  piperacillin-tazobactam (ZOSYN) IVPB 3.375 g     3.375 g 12.5 mL/hr over 240 Minutes Intravenous Every 8 hours 11/15/17 0718     11/15/17 0800  piperacillin-tazobactam (ZOSYN) IVPB 3.375 g     3.375 g 100 mL/hr over 30 Minutes Intravenous  Once 11/15/17 0718 11/15/17 0923   11/10/17 1210  ceFAZolin (ANCEF) 2-4 GM/100ML-% IVPB    Note to Pharmacy:  [name], [place]   : cabinet override      11/10/17 1210 11/10/17 1451   11/10/17 1200  ceFAZolin (ANCEF) IVPB 2g/100 mL premix     2 g 200 mL/hr over 30 Minutes Intravenous On call to O.R. 11/10/17 1158 11/10/17 1501      Assessment/Plan: s/p  Procedure(s): LEFT INGUINAL HERNIA REPAIR WITH MESH (Left) GROIN EXPLORATION WITH REMOVAL OF INGUINAL HERNIA MESH (Left) SMALL BOWEL RESECTION (N/A)  D/c NG Decrease IVF   LOS: 4 days    Michael [name] A 11/18/2017

## 2017-11-18 NOTE — Consult Note (Signed)
Physical Medicine and Rehabilitation Consult   Reason for Consult: Debility Referring Physician: Dr. Ninfa Linden.    HPI: Michael [name] is a 84 y.o. male with history of CLL, angina, large left inguinal hernia who was admitted on 11/10/17 for repair of inguinal hernia. Pos op course complicated by ileus requiring exploration with small bowel resection on 10/21. NG tube d/c this am as tolerating ice chips and to start clears.  Therapy evaluation revealed debility and CIR recommended due to functional deficits.     Review of Systems  Unable to perform ROS: Other  Gastrointestinal: Positive for abdominal pain.      Past Medical History:  Diagnosis Date  . Anemia   . Anginal pain (HCC)    upon exertion  . Arthritis    "left shoulder, lower back" (10/20/2016)  . CLL (chronic lymphoblastic leukemia) dx'd 2000  . Edema leg   . Family history of adverse reaction to anesthesia    "daughter's BP drops"   . GERD (gastroesophageal reflux disease)   . History of hiatal hernia   . 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 Lymphocytosis   . Type II diabetes mellitus ([place])     Past Surgical History:  Procedure Laterality Date  . BOWEL RESECTION N/A 11/15/2017   Procedure: SMALL BOWEL RESECTION;  Surgeon: Coralie Keens, MD;  Location: [facility];  Service: General;  Laterality: N/A;  . CATARACT EXTRACTION W/ INTRAOCULAR LENS  IMPLANT, BILATERAL Bilateral   . EYE SURGERY     Cataracts (bilateral)  . GROIN DISSECTION Left 11/15/2017   Procedure: GROIN EXPLORATION WITH REMOVAL OF INGUINAL HERNIA MESH;  Surgeon: Coralie Keens, MD;  Location: [facility];  Service: General;  Laterality: Left;  . INGUINAL HERNIA REPAIR Left 11/10/2017   Procedure: LEFT INGUINAL HERNIA REPAIR ERAS PATHWAY;  Surgeon: Coralie Keens, MD;  Location: [facility];  Service: General;  Laterality: Left;  . INGUINAL HERNIA REPAIR Left 11/15/2017   Procedure: LEFT INGUINAL HERNIA REPAIR WITH MESH;  Surgeon:  Coralie Keens, MD;  Location: [facility];  Service: General;  Laterality: Left;  . INSERTION OF MESH Left 11/10/2017   Procedure: INSERTION OF MESH;  Surgeon: Coralie Keens, MD;  Location: [facility];  Service: General;  Laterality: Left;    Family History  Problem Relation Age of Onset  . Cancer Mother   . Hypertension Father   . Diabetes Father     Social History:  Lives alone. Per reports that he has never smoked. He quit smokeless tobacco use about 2 years ago.  His smokeless tobacco use included snuff.  Per  reports that he drank alcohol. He reports that he does not use drugs.    Allergies: No Known Allergies    Medications Prior to Admission  Medication Sig Dispense Refill  . acetaminophen (TYLENOL) 650 MG CR tablet Take 1,300 mg by mouth 2 (two) times daily as needed for pain.    Marland Kitchen aspirin EC 81 MG tablet Take 81 mg by mouth daily.    . Cholecalciferol (VITAMIN D3) 1000 units CAPS Take 1,000 Units by mouth 2 (two) times daily.     Marland Kitchen docusate sodium (COLACE) 50 MG capsule Take 50 mg by mouth 2 (two) times daily as needed for moderate constipation.     . IMBRUVICA 140 MG capsul TAKE 2 CAPSULES (280 MG TOTAL) BY MOUTH DAILY. (Patient taking differently: Take 280 mg by mouth daily. ) 60 capsule 6  . insulin NPH-insulin regular (NOVOLIN 70/30) (70-30) 100 UNIT/ML  injection Inject 15-20 Units into the skin 2 (two) times daily. Sliding scale    . metFORMIN (GLUCOPHAGE) 1000 MG tablet Take 1,000 mg by mouth 2 (two) times daily with a meal.     . morphine (MSIR) 15 MG tablet Take 15 mg by mouth 2 (two) times daily.   0  . Multiple Vitamin (MULTIVITAMIN WITH MINERALS) TABS tablet Take 1 tablet by mouth daily.    . pantoprazole (PROTONIX) 40 MG tablet Take 1 tablet (40 mg total) by mouth daily. 30 tablet 11  . Polyethyl Glycol-Propyl Glycol (SYSTANE OP) Place 1 drop into both eyes 2 (two) times daily.    . ramipril (ALTACE) 5 MG capsule Take 5 mg by mouth daily.  0  . Tamsulosin HCl  (FLOMAX) 0.4 MG CAPS Take 0.4 mg by mouth daily after supper.     . torsemide (DEMADEX) 20 MG tablet Take 20 mg by mouth daily after supper.     . vitamin B-12 (CYANOCOBALAMIN) 1000 MCG tablet Take 1,000 mcg by mouth daily.      Home: Home Living Family/patient expects to be discharged to:: Private residence Living Arrangements: Alone Available Help at Discharge: Family, Available PRN/intermittently Type of Home: House Home Access: [place]: One level Bathroom Shower/Tub: [place]: [place], Civil engineer, contracting, Environmental consultant - 4 wheels, Environmental consultant - 2 wheels  Functional History: Prior Function Level of Independence: Independent with assistive device(s) Comments: Used cane Functional Status:  Mobility: Bed Mobility Overal bed mobility: Needs Assistance Bed Mobility: Supine to Sit Supine to sit: Min assist General bed mobility comments: Light min assist with HOB elevated and use of bed rails. Increased time and effort Transfers Overall transfer level: Needs assistance Equipment used: None Transfers: Sit to/from Stand, Stand Pivot Transfers Sit to Stand: Min assist General transfer comment: Min assist for initiation. Patient reaching for chair arm for external support      ADL:    Cognition: Cognition Overall Cognitive Status: Within Functional Limits for tasks assessed Orientation Level: Oriented X4 Cognition Arousal/Alertness: Awake/alert Behavior During Therapy: WFL for tasks assessed/performed Overall Cognitive Status: Within Functional Limits for tasks assessed  Blood pressure (!) 105/53, pulse 88, temperature 99.3 F (37.4 C), temperature source Oral, resp. rate 16, height 5' 5.98" (1.676 m), weight 88.5 kg, SpO2 96 %. Physical Exam  Nursing note and vitals reviewed. Constitutional:  Elderly male NAD. He was agitated and rambling about being disturbed and being tired after his bath. He refused to participate in exam.    HENT:   hoh   Eyes: Right eye exhibits no discharge. Left eye exhibits no discharge.  Neck: No thyromegaly present.  Cardiovascular: Normal rate.  Respiratory: Effort normal.  GI: Soft.  Musculoskeletal: He exhibits edema.  Stasis changes with 2+ edema.   Neurological: He is alert.  Fair insight and awareness. UE 3/5. LE 2-3+/5 prox to distal.   Psychiatric:  Pleasant and cooperative    Results for orders placed or performed during the hospital encounter of 11/10/17 (from the past 24 hour(s))  Glucose, capillary     Status: Abnormal   Collection Time: 11/17/17 12:44 PM  Result Value Ref Range   Glucose-Capillary 156 (H) 70 - 99 mg/dL  Glucose, capillary     Status: Abnormal   Collection Time: 11/17/17  5:03 PM  Result Value Ref Range   Glucose-Capillary 142 (H) 70 - 99 mg/dL  Glucose, capillary     Status: Abnormal   Collection Time: 11/17/17  8:03 PM  Result Value Ref Range   Glucose-Capillary 140 (H) 70 - 99 mg/dL  Glucose, capillary     Status: Abnormal   Collection Time: 11/17/17 11:48 PM  Result Value Ref Range   Glucose-Capillary 120 (H) 70 - 99 mg/dL  Basic metabolic panel     Status: Abnormal   Collection Time: 11/18/17  3:55 AM  Result Value Ref Range   Sodium 152 (H) 135 - 145 mmol/L   Potassium 4.4 3.5 - 5.1 mmol/L   Chloride 122 (H) 98 - 111 mmol/L   CO2 24 22 - 32 mmol/L   Glucose, Bld 173 (H) 70 - 99 mg/dL   BUN 50 (H) 8 - 23 mg/dL   Creatinine, Ser 1.67 (H) 0.61 - 1.24 mg/dL   Calcium 8.3 (L) 8.9 - 10.3 mg/dL   GFR calc non Af Amer 36 (L) >60 mL/min   GFR calc Af Amer 41 (L) >60 mL/min   Anion gap 6 5 - 15  CBC     Status: Abnormal   Collection Time: 11/18/17  3:55 AM  Result Value Ref Range   WBC 16.0 (H) 4.0 - 10.5 K/uL   RBC 3.79 (L) 4.22 - 5.81 MIL/uL   Hemoglobin 10.0 (L) 13.0 - 17.0 g/dL   HCT 33.2 (L) 39.0 - 52.0 %   MCV 87.6 80.0 - 100.0 fL   MCH 26.4 26.0 - 34.0 pg   MCHC 30.1 30.0 - 36.0 g/dL   RDW 17.2 (H) 11.5 - 15.5 %   Platelets 230 150  - 400 K/uL   nRBC 0.0 0.0 - 0.2 %  Glucose, capillary     Status: Abnormal   Collection Time: 11/18/17  4:12 AM  Result Value Ref Range   Glucose-Capillary 159 (H) 70 - 99 mg/dL  Glucose, capillary     Status: Abnormal   Collection Time: 11/18/17  8:09 AM  Result Value Ref Range   Glucose-Capillary 134 (H) 70 - 99 mg/dL   No results found.   Assessment/Plan: Diagnosis: debility related to inguinal hernia,ileus, multiple medical 1. Does the need for close, 24 hr/day medical supervision in concert with the patient's rehab needs make it unreasonable for this patient to be served in a less intensive setting? Potentially 2. Co-Morbidities requiring supervision/potential complications: see above 3. Due to bladder management, bowel management, skin/wound care, disease management and medication administration, does the patient require 24 hr/day rehab nursing? Potentially 4. Does the patient require coordinated care of a physician, rehab nurse, PT (1-2 hrs/day, 5 days/week) and OT (1-2 hrs/day, 5 days/week) to address physical and functional deficits in the context of the above medical diagnosis(es)? No Addressing deficits in the following areas: balance, endurance, transferring, dressing, grooming and toileting 5. Can the patient actively participate in an intensive therapy program of at least 3 hrs of therapy per day at least 5 days per week? No 6. The potential for patient to make measurable gains while on inpatient rehab is fair 7. Anticipated functional outcomes upon discharge from inpatient rehab are n/a  with PT, n/a with OT, n/a with SLP. 8. Estimated rehab length of stay to reach the above functional goals is: n/a 9. Anticipated D/C setting: Other 10. Anticipated post D/C treatments: [place] therapy 11. Overall Rehab/Functional Prognosis: good and fair  RECOMMENDATIONS: This patient's condition is appropriate for continued rehabilitative care in the following setting: SNF Patient has agreed  to participate in recommended program. Potentially Note that insurance prior authorization may be required for reimbursement for  recommended care.  Comment: Pt with limited assistance. Some decisions regarding disposition in the long term likely need to be made.   I have personally performed a face to face diagnostic evaluation of this patient and formulated the key components of the plan.  Additionally, I have personally reviewed laboratory data, imaging studies, as well as relevant notes and concur with the physician assistant's documentation above.  Meredith Staggers, MD, Mellody Drown   Bary Leriche, PA-C 11/18/2017

## 2017-11-19 LAB — GLUCOSE, CAPILLARY
GLUCOSE-CAPILLARY: 132 mg/dL — AB (ref 70–99)
GLUCOSE-CAPILLARY: 256 mg/dL — AB (ref 70–99)
Glucose-Capillary: 141 mg/dL — ABNORMAL HIGH (ref 70–99)
Glucose-Capillary: 173 mg/dL — ABNORMAL HIGH (ref 70–99)
Glucose-Capillary: 194 mg/dL — ABNORMAL HIGH (ref 70–99)
Glucose-Capillary: 226 mg/dL — ABNORMAL HIGH (ref 70–99)

## 2017-11-19 MED ORDER — ACETAMINOPHEN 325 MG PO TABS
650.0000 mg | ORAL_TABLET | Freq: Four times a day (QID) | ORAL | Status: DC
  Administered 2017-11-19 – 2017-11-21 (×10): 650 mg
  Filled 2017-11-19 (×10): qty 2

## 2017-11-19 MED ORDER — SODIUM CHLORIDE 0.45 % IV BOLUS
1000.0000 mL | Freq: Once | INTRAVENOUS | Status: AC
  Administered 2017-11-19: 1000 mL via INTRAVENOUS

## 2017-11-19 NOTE — Progress Notes (Signed)
4 Days Post-Op   Subjective/Chief Complaint: Pr denies nausea this morning Passing flatus   Objective: Vital signs in last 24 hours: Temp:  [97.9 F (36.6 C)-98.9 F (37.2 C)] 97.9 F (36.6 C) (10/25 0412) Pulse Rate:  [76-87] 81 (10/25 0412) Resp:  [16-18] 16 (10/25 0412) BP: (110-121)/(51-57) 114/53 (10/25 0412) SpO2:  [97 %-100 %] 99 % (10/25 0412) Last BM Date: 11/13/17  Intake/Output from previous day: 10/24 0701 - 10/25 0700 In: 2388.8 [P.O.:840; I.V.:1344.6; IV Piggyback:204.2] Out: 1625 [Urine:1625] Intake/Output this shift: No intake/output data recorded.  Exam: Lungs clear CV RRR Abd softer, +BS  Lab Results:  Recent Labs    11/18/17 0355  WBC 16.0*  HGB 10.0*  HCT 33.2*  PLT 230   BMET Recent Labs    11/17/17 0237 11/18/17 0355  NA 147* 152*  K 4.2 4.4  CL 113* 122*  CO2 28 24  GLUCOSE 143* 173*  BUN 52* 50*  CREATININE 1.68* 1.67*  CALCIUM 8.3* 8.3*   PT/INR No results for input(s): LABPROT, INR in the last 72 hours. ABG No results for input(s): PHART, HCO3 in the last 72 hours.  Invalid input(s): PCO2, PO2  Studies/Results: No results found.  Anti-infectives: Anti-infectives (From admission, onward)   Start     Dose/Rate Route Frequency Ordered Stop   11/15/17 1400  piperacillin-tazobactam (ZOSYN) IVPB 3.375 g     3.375 g 12.5 mL/hr over 240 Minutes Intravenous Every 8 hours 11/15/17 0718     11/15/17 0800  piperacillin-tazobactam (ZOSYN) IVPB 3.375 g     3.375 g 100 mL/hr over 30 Minutes Intravenous  Once 11/15/17 0718 11/15/17 0923   11/10/17 1210  ceFAZolin (ANCEF) 2-4 GM/100ML-% IVPB    Note to Pharmacy:  [name], [place]   : cabinet override      11/10/17 1210 11/10/17 1451   11/10/17 1200  ceFAZolin (ANCEF) IVPB 2g/100 mL premix     2 g 200 mL/hr over 30 Minutes Intravenous On call to O.R. 11/10/17 1158 11/10/17 1501      Assessment/Plan: s/p Procedure(s): LEFT INGUINAL HERNIA REPAIR WITH MESH (Left) GROIN  EXPLORATION WITH REMOVAL OF INGUINAL HERNIA MESH (Left) SMALL BOWEL RESECTION (N/A)  Repeat labs Try clear liquid tray PT Continue IVF  LOS: 5 days    Garet Hooton A 11/19/2017

## 2017-11-20 LAB — BASIC METABOLIC PANEL
Anion gap: 12 (ref 5–15)
BUN: 48 mg/dL — ABNORMAL HIGH (ref 8–23)
CHLORIDE: 119 mmol/L — AB (ref 98–111)
CO2: 24 mmol/L (ref 22–32)
Calcium: 8.2 mg/dL — ABNORMAL LOW (ref 8.9–10.3)
Creatinine, Ser: 1.48 mg/dL — ABNORMAL HIGH (ref 0.61–1.24)
GFR calc Af Amer: 48 mL/min — ABNORMAL LOW (ref >60)
GFR calc non Af Amer: 41 mL/min — ABNORMAL LOW (ref >60)
GLUCOSE: 179 mg/dL — AB (ref 70–99)
POTASSIUM: 3.8 mmol/L (ref 3.5–5.1)
SODIUM: 155 mmol/L — AB (ref 135–145)

## 2017-11-20 LAB — GLUCOSE, CAPILLARY
GLUCOSE-CAPILLARY: 139 mg/dL — AB (ref 70–99)
GLUCOSE-CAPILLARY: 205 mg/dL — AB (ref 70–99)
GLUCOSE-CAPILLARY: 212 mg/dL — AB (ref 70–99)
Glucose-Capillary: 154 mg/dL — ABNORMAL HIGH (ref 70–99)
Glucose-Capillary: 156 mg/dL — ABNORMAL HIGH (ref 70–99)
Glucose-Capillary: 173 mg/dL — ABNORMAL HIGH (ref 70–99)

## 2017-11-20 MED ORDER — IBRUTINIB 140 MG PO TABS: 280.0000 mg | ORAL_TABLET | Freq: Every day | ORAL | Status: DC

## 2017-11-20 MED ORDER — KCL IN DEXTROSE-NACL 20-5-0.45 MEQ/L-%-% IV SOLN
INTRAVENOUS | Status: DC
  Administered 2017-11-20 – 2017-11-21 (×2): via INTRAVENOUS
  Filled 2017-11-20 (×3): qty 1000

## 2017-11-20 NOTE — Clinical Social Work Note (Signed)
Clinical Social Work Assessment  Patient Details  Name: Michael [name] MRN: [id number] Date of Birth: [birth year]  Date of referral:  11/20/17               Reason for consult:  Facility Placement, Discharge Planning                Permission sought to share information with:  Family Supports, Chartered certified accountant granted to share information::  Yes, Verbal Permission Granted  Name::     Financial controller::  SNFs  Relationship::  pt daughter  Contact Information:     Housing/Transportation Living arrangements for the past 2 months:  Single Family Home Source of Information:  Patient, Adult Children Patient Interpreter Needed:  None Criminal Activity/Legal Involvement Pertinent to Current Situation/Hospitalization:  No - Comment as needed Significant Relationships:  Adult Children, Other Family Members Lives with:  Self Do you feel safe going back to the place where you live?  Yes Need for family participation in patient care:  Yes (Comment)  Care giving concerns:  Pt requiring multiple surgeries this admission, weaker than baseline requesting SNF placement.    Social Worker assessment / plan:  CSW met with pt daughter at bedside, pt gave permission for CSW to continue assessment as he fell asleep after receiving medication. Pt states that pt's wife was at [facility] for an extended stay. She is familiar with SNF process but wants to get pt input. CSW left packet of SNF offers and will f/u on Monday to initiate insurance authorization.  Employment status:  Retired Nurse, adult PT Recommendations:  [place], [place] / Referral to community resources:  [place]  Patient/Family's Response to care:  Pt daughter amenable to [place] visit, interested in SNF. Would like to discuss with pt when he is more alert and will have choice for CSW.  Patient/Family's Understanding of and Emotional  Response to Diagnosis, Current Treatment, and Prognosis:  Pt and pt daughter state understanding of diagnosis, current treatment and prognosis. Pt and pt daughter positive and emotionally appropriate. They look forward to pt improving and getting stronger at SNF before going home.   Emotional Assessment Appearance:  Appears stated age Attitude/Demeanor/Rapport:  Engaged, Gracious Affect (typically observed):  Accepting, Adaptable, Appropriate, Quiet Orientation:  Oriented to Self, Oriented to Place, Oriented to  Time, Oriented to Situation Alcohol / Substance use:  Not Applicable Psych involvement (Current and /or in the community):  No (Comment)  Discharge Needs  Concerns to be addressed:  Care Coordination Readmission within the last 30 days:  No Current discharge risk:  Dependent with Mobility, Physical Impairment Barriers to Discharge:  Ship broker, Continued Medical Work up   [employer], [place] 11/20/2017, 11:05 AM

## 2017-11-20 NOTE — Plan of Care (Signed)

## 2017-11-20 NOTE — Consult Note (Addendum)
[facility] Progress Note  5 Days Post-Op  Subjective: Pt reports doing better today. He is uncomfortable lying in bed, mostly due to "stiffness" in his neck and feels better sitting up. He complains of phlegm in his throat that is difficult to expel. He reports decreased appetite and bloating, but denies pain/discomfort. He is tolerating PO without NV, but has had a minimal amount of broth yesterday and a few sips of water today. He continues to have flatus, with no BM.  Pt's youngest daughter Emelia Loron) is present on exam and reports that pt is doing much better compared to the previous few days.  Objective: Vital signs in last 24 hours: Temp:  [97.7 F (36.5 C)-99.1 F (37.3 C)] 99.1 F (37.3 C) (10/26 0426) Pulse Rate:  [83-87] 83 (10/26 0426) Resp:  [16] 16 (10/26 0426) BP: (105-125)/(51-63) 115/53 (10/26 0426) SpO2:  [94 %-97 %] 94 % (10/26 0426) Last BM Date: 11/19/17  Intake/Output from previous day: 10/25 0701 - 10/26 0700 In: 1242.4 [P.O.:360; I.V.:707.6; IV Piggyback:174.7] Out: 950 [Urine:950] Intake/Output this shift: No intake/output data recorded.  PE: Gen:  Alert, NAD, lying in bed, pleasant but frustrated at his current condition Card:  Regular rate and rhythm Pulm:  Normal effort, clear to auscultation bilaterally Abd: Soft, TTP diffusely across the abd, non-distended, bowel sounds present but minimal, incisions clean, dry and intact. Skin: warm and dry, no rashes  Psych: A&Ox3   Lab Results:  Recent Labs    11/18/17 0355  WBC 16.0*  HGB 10.0*  HCT 33.2*  PLT 230   BMET Recent Labs    11/18/17 0355 11/20/17 0400  NA 152* 155*  K 4.4 3.8  CL 122* 119*  CO2 24 24  GLUCOSE 173* 179*  BUN 50* 48*  CREATININE 1.67* 1.48*  CALCIUM 8.3* 8.2*   PT/INR No results for input(s): LABPROT, INR in the last 72 hours. CMP     Component Value Date/Time   NA 155 (H) 11/20/2017 0400   NA 140 10/09/2016 1106   K 3.8 11/20/2017 0400   K 4.7 10/09/2016 1106   CL 119 (H) 11/20/2017 0400   CL 104 03/28/2012 1426   CO2 24 11/20/2017 0400   CO2 28 10/09/2016 1106   GLUCOSE 179 (H) 11/20/2017 0400   GLUCOSE 127 10/09/2016 1106   GLUCOSE 171 (H) 03/28/2012 1426   BUN 48 (H) 11/20/2017 0400   BUN 20.8 10/09/2016 1106   CREATININE 1.48 (H) 11/20/2017 0400   CREATININE 1.1 10/09/2016 1106   CALCIUM 8.2 (L) 11/20/2017 0400   CALCIUM 9.1 10/09/2016 1106   PROT 6.3 (L) 07/27/2017 1230   PROT 5.8 (L) 10/09/2016 1106   ALBUMIN 3.4 (L) 07/27/2017 1230   ALBUMIN 3.1 (L) 10/09/2016 1106   AST 15 07/27/2017 1230   AST 17 10/09/2016 1106   ALT 8 07/27/2017 1230   ALT 8 10/09/2016 1106   ALKPHOS 52 07/27/2017 1230   ALKPHOS 46 10/09/2016 1106   BILITOT <0.2 (L) 07/27/2017 1230   BILITOT 0.23 10/09/2016 1106   GFRNONAA 41 (L) 11/20/2017 0400   GFRAA 48 (L) 11/20/2017 0400   Lipase  No results found for: LIPASE     Studies/Results: No results found.  Anti-infectives: Anti-infectives (From admission, onward)   Start     Dose/Rate Route Frequency Ordered Stop   11/15/17 1400  piperacillin-tazobactam (ZOSYN) IVPB 3.375 g     3.375 g 12.5 mL/hr over 240 Minutes Intravenous Every 8 hours 11/15/17 0718  11/15/17 0800  piperacillin-tazobactam (ZOSYN) IVPB 3.375 g     3.375 g 100 mL/hr over 30 Minutes Intravenous  Once 11/15/17 0718 11/15/17 0923   11/10/17 1210  ceFAZolin (ANCEF) 2-4 GM/100ML-% IVPB    Note to Pharmacy:  [name], [place]   : cabinet override      11/10/17 1210 11/10/17 1451   11/10/17 1200  ceFAZolin (ANCEF) IVPB 2g/100 mL premix     2 g 200 mL/hr over 30 Minutes Intravenous On call to O.R. 11/10/17 1158 11/10/17 1501       Assessment/Plan - Left inguinal hernia repair - 11/10/2017 Ninfa Linden - S/p LT inguinal hernia repair with mesh, and small bowel resection. 11/15/2017 Ninfa Linden  -Pt continues to improve, reporting no pain although TTP diffusely across abdomen. This pain seems appropriate given  his medical course and procedure. He is afebrile, tolerating PO without NV and continues to have flatus.   -Cr remains elevated at 1.48, but continues trending down (last 1.67) -Most recent WBC elevated and Hbg low, this is likely due to his procedure -Urine output good at 973ml  -Continue IV fluids for hydration -Continue IV abx  Zosyn - 10/21 >>> -Continue pain meds prn -Continue clear liquid diet at this time -Continue working with PT for improved mobility -Repeat labs including CBC, CMP -Will remain in hospital at this time for continued monitoring, IV fluids & abx.    LOS: 6 days    Vita Barley, PA-S

## 2017-11-20 NOTE — Progress Notes (Signed)
MEDICATION RELATED CONSULT NOTE - INITIAL   Pharmacy Consult for Imbruvica  Indication: Post Surgery Restart  Assessment: 84yo male who is s/p Left inguinal hernia repair - 11/10/2017 and s/p LT inguinal hernia repair with mesh, and small bowel resection on 11/15/2017.  Pharmacy was asked to evaluate and restart his CLL medication - Imbruvica.  This has been on hold prior to surgery for 7 days and per package insert - should continue on hold from 3-7 days post surgery.  His CBC appears stable and no noted bleeding.  Plan:  I have entered this medication to resume on 10/28 which will be 7 days post surgery.  Will leave assessment from a CBC standpoint for surgery to review and change the start date if desired.  No Known Allergies  Patient Measurements: Height: 5' 5.98" (167.6 cm) Weight: 195 lb 1.6 oz (88.5 kg) IBW/kg (Calculated) : 63.76  Vital Signs: Temp: 99.1 F (37.3 C) (10/26 0426) Temp Source: Oral (10/26 0426) BP: 115/53 (10/26 0426) Pulse Rate: 83 (10/26 0426) Intake/Output from previous day: 10/25 0701 - 10/26 0700 In: 1242.4 [P.O.:360; I.V.:707.6; IV Piggyback:174.7] Out: 950 [Urine:950] Intake/Output from this shift: Total I/O In: 593.5 [I.V.:493.5; IV Piggyback:100] Out: -   Labs: Recent Labs    11/18/17 0355 11/20/17 0400  WBC 16.0*  --   HGB 10.0*  --   HCT 33.2*  --   PLT 230  --   CREATININE 1.67* 1.48*   Estimated Creatinine Clearance: 38 mL/min (A) (by C-G formula based on SCr of 1.48 mg/dL (H)).   Microbiology: No results found for this or any previous visit (from the past 720 hour(s)).  Medical History: Past Medical History:  Diagnosis Date  . Anemia   . Anginal pain (HCC)    upon exertion  . Arthritis    "left shoulder, lower back" (10/20/2016)  . CLL (chronic lymphoblastic leukemia) dx'd 2000  . Edema leg   . Family history of adverse reaction to anesthesia    "daughter's BP drops"   . GERD (gastroesophageal reflux disease)   .  History of hiatal hernia   . 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 Lymphocytosis   . Type II diabetes mellitus ([place])     Rober Minion, PharmD., MS Clinical Pharmacist Pager:  [phone] Thank you for allowing pharmacy to be part of this patients care team. 11/20/2017,1:24 PM

## 2017-11-21 LAB — BASIC METABOLIC PANEL
ANION GAP: 4 — AB (ref 5–15)
BUN: 45 mg/dL — ABNORMAL HIGH (ref 8–23)
CHLORIDE: 127 mmol/L — AB (ref 98–111)
CO2: 25 mmol/L (ref 22–32)
Calcium: 7.5 mg/dL — ABNORMAL LOW (ref 8.9–10.3)
Creatinine, Ser: 1.74 mg/dL — ABNORMAL HIGH (ref 0.61–1.24)
GFR calc Af Amer: 39 mL/min — ABNORMAL LOW (ref >60)
GFR calc non Af Amer: 34 mL/min — ABNORMAL LOW (ref >60)
Glucose, Bld: 330 mg/dL — ABNORMAL HIGH (ref 70–99)
POTASSIUM: 3.9 mmol/L (ref 3.5–5.1)
Sodium: 156 mmol/L — ABNORMAL HIGH (ref 135–145)

## 2017-11-21 LAB — GLUCOSE, CAPILLARY
GLUCOSE-CAPILLARY: 296 mg/dL — AB (ref 70–99)
GLUCOSE-CAPILLARY: 261 mg/dL — AB (ref 70–99)
GLUCOSE-CAPILLARY: 276 mg/dL — AB (ref 70–99)
GLUCOSE-CAPILLARY: 340 mg/dL — AB (ref 70–99)
Glucose-Capillary: 264 mg/dL — ABNORMAL HIGH (ref 70–99)
Glucose-Capillary: 344 mg/dL — ABNORMAL HIGH (ref 70–99)

## 2017-11-21 LAB — CBC WITH DIFFERENTIAL/PLATELET
Abs Immature Granulocytes: 0.28 10*3/uL — ABNORMAL HIGH (ref 0.00–0.07)
BASOS ABS: 0 10*3/uL (ref 0.0–0.1)
Basophils Relative: 0 %
Eosinophils Absolute: 0.2 10*3/uL (ref 0.0–0.5)
Eosinophils Relative: 1 %
HEMATOCRIT: 28.1 % — AB (ref 39.0–52.0)
Hemoglobin: 8.1 g/dL — ABNORMAL LOW (ref 13.0–17.0)
IMMATURE GRANULOCYTES: 2 %
LYMPHS ABS: 2.4 10*3/uL (ref 0.7–4.0)
LYMPHS PCT: 14 %
MCH: 25.8 pg — ABNORMAL LOW (ref 26.0–34.0)
MCHC: 28.8 g/dL — ABNORMAL LOW (ref 30.0–36.0)
MCV: 89.5 fL (ref 80.0–100.0)
Monocytes Absolute: 2.7 10*3/uL — ABNORMAL HIGH (ref 0.1–1.0)
Monocytes Relative: 15 %
NEUTROS PCT: 68 %
Neutro Abs: 12.1 10*3/uL — ABNORMAL HIGH (ref 1.7–7.7)
Platelets: 298 10*3/uL (ref 150–400)
RBC: 3.14 MIL/uL — ABNORMAL LOW (ref 4.22–5.81)
RDW: 17.6 % — AB (ref 11.5–15.5)
WBC: 17.7 10*3/uL — ABNORMAL HIGH (ref 4.0–10.5)
nRBC: 0 % (ref 0.0–0.2)

## 2017-11-21 MED ORDER — ACETAMINOPHEN 325 MG PO TABS
650.0000 mg | ORAL_TABLET | Freq: Four times a day (QID) | ORAL | Status: DC
  Administered 2017-11-21 – 2017-12-05 (×45): 650 mg via ORAL
  Filled 2017-11-21 (×47): qty 2

## 2017-11-21 MED ORDER — KCL IN DEXTROSE-NACL 20-5-0.2 MEQ/L-%-% IV SOLN
INTRAVENOUS | Status: AC
  Administered 2017-11-21 – 2017-11-22 (×3): via INTRAVENOUS
  Filled 2017-11-21 (×5): qty 1000

## 2017-11-21 MED ORDER — POTASSIUM CHLORIDE IN NACL 20-0.45 MEQ/L-% IV SOLN: INTRAVENOUS | Status: DC

## 2017-11-21 NOTE — Progress Notes (Signed)
Michael [name]:  [phone] General Surgery Progress Note   LOS: 7 days  POD -  6 Days Post-Op  Chief Complaint: Hernia  Assessment and Plan: 1.  Left inguinal hernia repair - 11/10/2017 Ninfa Linden  2.  LEFT INGUINAL HERNIA REPAIR WITH MESH GROIN EXPLORATION WITH REMOVAL OF INGUINAL HERNIA MESH, SMALL BOWEL RESECTION - 11/15/2017  Zosyn - 10/21 >>>  WBC - 17,700 - 11/21/2017  Poor po intake  3. Elevated Creatinine  Creatinine - 1.74 - 11/21/2017 4.  Anemia  Hgb - 8.1 - 11/21/2017 5.  DM  BS - 330 - 11/21/2017 6.  Deconditioned  PT saw him, but has not been back???  Will need to go to SNF 7.  CLL  Followed by Dr. Natale Lay  On meds for CLL which are on hold right now 8.  Nutrition  Will check pre albumin in AM 9.  Right posterior neck pain without obvious injury  10.   DVT prophylaxis - on no chemoprophylaxis right now   Active Problems:   S/P left inguinal hernia repair   Ileus (HCC)  Subjective:  Limited history.  Talking minimal po's.  Daughter, Michael [name], at bedside.  Objective:   Vitals:   11/20/17 2004 11/21/17 0418  BP: (!) 107/45 (!) 103/44  Pulse: 91 83  Resp: 18 18  Temp: (!) 101.1 F (38.4 C) 97.8 F (36.6 C)  SpO2: 92% 98%     Intake/Output from previous day:  10/26 0701 - 10/27 0700 In: 2407.8 [I.V.:2182.5; IV Piggyback:225.3] Out: 2150 [Urine:2150]  Intake/Output this shift:  No intake/output data recorded.   Physical Exam:   General: Thin older AA M who is alert.   HEENT: Normal. Pupils equal.  Complains of right neck pain and turns his head to the left. .   Lungs: Clear.  Does not really do the IS.   Abdomen: Distended, but with BS.  No localized tenderness   Wound: Clean   Lab Results:    Recent Labs    11/21/17 0321  WBC 17.7*  HGB 8.1*  HCT 28.1*  PLT 298    BMET   Recent Labs    11/20/17 0400 11/21/17 0321  NA 155* 156*  K 3.8 3.9  CL 119* 127*  CO2 24 25  GLUCOSE 179* 330*  BUN  48* 45*  CREATININE 1.48* 1.74*  CALCIUM 8.2* 7.5*    PT/INR  No results for input(s): LABPROT, INR in the last 72 hours.  ABG  No results for input(s): PHART, HCO3 in the last 72 hours.  Invalid input(s): PCO2, PO2   Studies/Results:  No results found.   Anti-infectives:   Anti-infectives (From admission, onward)   Start     Dose/Rate Route Frequency Ordered Stop   11/15/17 1400  piperacillin-tazobactam (ZOSYN) IVPB 3.375 g     3.375 g 12.5 mL/hr over 240 Minutes Intravenous Every 8 hours 11/15/17 0718     11/15/17 0800  piperacillin-tazobactam (ZOSYN) IVPB 3.375 g     3.375 g 100 mL/hr over 30 Minutes Intravenous  Once 11/15/17 0718 11/15/17 0923   11/10/17 1210  ceFAZolin (ANCEF) 2-4 GM/100ML-% IVPB    Note to Pharmacy:  Michael [name], [place]   : cabinet override      11/10/17 1210 11/10/17 1451   11/10/17 1200  ceFAZolin (ANCEF) IVPB 2g/100 mL premix     2 g 200 mL/hr over 30 Minutes Intravenous On call to O.R. 11/10/17 1158 11/10/17 1501      Michael Brow  Lucia Gaskins, MD, [place] Pager: [name]: [phone] 11/21/2017

## 2017-11-21 NOTE — Progress Notes (Addendum)
MEDICATION RELATED CONSULT NOTE   Pharmacy Consult for Imbruvica  Indication: Post Surgery Restart  Assessment: 84yo male who is s/p Left inguinal hernia repair - 11/10/2017 and s/p LT inguinal hernia repair with mesh, and small bowel resection on 11/15/2017.  Pharmacy was asked to evaluate and restart his CLL medication - Imbruvica.  This has been on hold prior to surgery for 7 days and per package insert - should continue on hold from 3-7 days post surgery.  His Hgb has dropped some in the last 24 hours (10.0 > 8.1).    Plan:  - I have entered this medication to resume on 10/28 which will be 7 days post surgery.  Will leave assessment from a CBC standpoint for surgery to review and change the start date if desired. - Spoke with family and they have brought his medications in from home for use.  Spoke with this patients nurse - she will take to pharmacy for packaging should patient be restarted tomorrow. - Pharmacy will sign off - let us know if there is anything else we can assist with.   Rober Minion, PharmD., MS Clinical Pharmacist Pager:  [phone] Thank you for allowing pharmacy to be part of this patients care team. 11/21/2017,10:47 AM

## 2017-11-21 NOTE — Progress Notes (Signed)
Pt noted with low BP--81/46 Oncall Dr. Redmond Pulling made aware with no new orders.

## 2017-11-22 ENCOUNTER — Inpatient Hospital Stay (HOSPITAL_COMMUNITY): Payer: Medicare HMO

## 2017-11-22 ENCOUNTER — Inpatient Hospital Stay: Payer: Self-pay

## 2017-11-22 DIAGNOSIS — L8990 Pressure ulcer of unspecified site, unspecified stage: Secondary | ICD-10-CM

## 2017-11-22 LAB — BASIC METABOLIC PANEL
ANION GAP: 4 — AB (ref 5–15)
Anion gap: 5 (ref 5–15)
BUN: 49 mg/dL — AB (ref 8–23)
BUN: 48 mg/dL — ABNORMAL HIGH (ref 8–23)
CALCIUM: 7.6 mg/dL — AB (ref 8.9–10.3)
CHLORIDE: 124 mmol/L — AB (ref 98–111)
CO2: 24 mmol/L (ref 22–32)
CO2: 24 mmol/L (ref 22–32)
CREATININE: 2.02 mg/dL — AB (ref 0.61–1.24)
CREATININE: 1.96 mg/dL — AB (ref 0.61–1.24)
Calcium: 7.5 mg/dL — ABNORMAL LOW (ref 8.9–10.3)
Chloride: 126 mmol/L — ABNORMAL HIGH (ref 98–111)
GFR calc Af Amer: 33 mL/min — ABNORMAL LOW (ref >60)
GFR calc non Af Amer: 28 mL/min — ABNORMAL LOW (ref >60)
GFR calc non Af Amer: 29 mL/min — ABNORMAL LOW (ref >60)
GFR, EST AFRICAN AMERICAN: 34 mL/min — AB (ref >60)
Glucose, Bld: 272 mg/dL — ABNORMAL HIGH (ref 70–99)
Glucose, Bld: 289 mg/dL — ABNORMAL HIGH (ref 70–99)
POTASSIUM: 3.9 mmol/L (ref 3.5–5.1)
POTASSIUM: 4.1 mmol/L (ref 3.5–5.1)
SODIUM: 155 mmol/L — AB (ref 135–145)
SODIUM: 152 mmol/L — AB (ref 135–145)

## 2017-11-22 LAB — CBC WITH DIFFERENTIAL/PLATELET
BASOS ABS: 0 10*3/uL (ref 0.0–0.1)
Band Neutrophils: 1 %
Basophils Relative: 0 %
EOS ABS: 0.2 10*3/uL (ref 0.0–0.5)
Eosinophils Relative: 1 %
HCT: 29.3 % — ABNORMAL LOW (ref 39.0–52.0)
Hemoglobin: 8.3 g/dL — ABNORMAL LOW (ref 13.0–17.0)
LYMPHS ABS: 1.7 10*3/uL (ref 0.7–4.0)
Lymphocytes Relative: 9 %
MCH: 25.6 pg — AB (ref 26.0–34.0)
MCHC: 28.3 g/dL — AB (ref 30.0–36.0)
MCV: 90.4 fL (ref 80.0–100.0)
Metamyelocytes Relative: 1 %
Monocytes Absolute: 0.8 10*3/uL (ref 0.1–1.0)
Monocytes Relative: 4 %
NEUTROS ABS: 16.6 10*3/uL — AB (ref 1.7–7.7)
NRBC: 0 /100{WBCs}
Neutrophils Relative %: 84 %
Platelets: 351 10*3/uL (ref 150–400)
RBC: 3.24 MIL/uL — ABNORMAL LOW (ref 4.22–5.81)
RDW: 17.7 % — ABNORMAL HIGH (ref 11.5–15.5)
WBC: 19.3 10*3/uL — ABNORMAL HIGH (ref 4.0–10.5)
nRBC: 0 % (ref 0.0–0.2)

## 2017-11-22 LAB — URINALYSIS, ROUTINE W REFLEX MICROSCOPIC
BILIRUBIN URINE: NEGATIVE
Glucose, UA: 50 mg/dL — AB
KETONES UR: NEGATIVE mg/dL
Nitrite: NEGATIVE
Protein, ur: NEGATIVE mg/dL
Specific Gravity, Urine: 1.015 (ref 1.005–1.030)
pH: 5 (ref 5.0–8.0)

## 2017-11-22 LAB — GLUCOSE, CAPILLARY
GLUCOSE-CAPILLARY: 230 mg/dL — AB (ref 70–99)
GLUCOSE-CAPILLARY: 211 mg/dL — AB (ref 70–99)
GLUCOSE-CAPILLARY: 267 mg/dL — AB (ref 70–99)
Glucose-Capillary: 239 mg/dL — ABNORMAL HIGH (ref 70–99)
Glucose-Capillary: 266 mg/dL — ABNORMAL HIGH (ref 70–99)
Glucose-Capillary: 269 mg/dL — ABNORMAL HIGH (ref 70–99)

## 2017-11-22 LAB — NA AND K (SODIUM & POTASSIUM), RAND UR
POTASSIUM UR: 31 mmol/L
SODIUM UR: 43 mmol/L

## 2017-11-22 LAB — PREALBUMIN: PREALBUMIN: 7 mg/dL — AB (ref 18–38)

## 2017-11-22 MED ORDER — SODIUM CHLORIDE 0.9% FLUSH
10.0000 mL | INTRAVENOUS | Status: DC | PRN
  Administered 2017-11-30: 10 mL
  Administered 2017-12-02 (×2): 20 mL
  Filled 2017-11-22 (×3): qty 40

## 2017-11-22 MED ORDER — BISACODYL 10 MG RE SUPP
10.0000 mg | Freq: Once | RECTAL | Status: AC
  Administered 2017-11-22: 10 mg via RECTAL
  Filled 2017-11-22: qty 1

## 2017-11-22 MED ORDER — TRAVASOL 10 % IV SOLN
INTRAVENOUS | Status: AC
  Administered 2017-11-22: 17:00:00 via INTRAVENOUS
  Filled 2017-11-22: qty 547.2

## 2017-11-22 MED ORDER — IBRUTINIB 140 MG PO CAPS
280.0000 mg | ORAL_CAPSULE | Freq: Every day | ORAL | Status: DC
  Administered 2017-11-24 – 2017-12-05 (×12): 280 mg via ORAL
  Filled 2017-11-22 (×14): qty 2

## 2017-11-22 MED ORDER — METOCLOPRAMIDE HCL 5 MG/ML IJ SOLN
5.0000 mg | Freq: Once | INTRAMUSCULAR | Status: AC
  Administered 2017-11-22: 5 mg via INTRAVENOUS
  Filled 2017-11-22: qty 2

## 2017-11-22 MED ORDER — DEXTROSE 5 % IV SOLN
INTRAVENOUS | Status: DC
  Administered 2017-11-22 – 2017-11-25 (×6): via INTRAVENOUS

## 2017-11-22 MED ORDER — INSULIN ASPART 100 UNIT/ML ~~LOC~~ SOLN
0.0000 [IU] | SUBCUTANEOUS | Status: DC
  Administered 2017-11-22: 7 [IU] via SUBCUTANEOUS
  Administered 2017-11-22 (×2): 11 [IU] via SUBCUTANEOUS
  Administered 2017-11-22: 7 [IU] via SUBCUTANEOUS
  Administered 2017-11-23: 3 [IU] via SUBCUTANEOUS
  Administered 2017-11-23: 11 [IU] via SUBCUTANEOUS
  Administered 2017-11-23: 15 [IU] via SUBCUTANEOUS
  Administered 2017-11-23: 4 [IU] via SUBCUTANEOUS
  Administered 2017-11-23: 7 [IU] via SUBCUTANEOUS
  Administered 2017-11-23: 15 [IU] via SUBCUTANEOUS
  Administered 2017-11-24: 7 [IU] via SUBCUTANEOUS
  Administered 2017-11-24: 4 [IU] via SUBCUTANEOUS
  Administered 2017-11-24 (×2): 7 [IU] via SUBCUTANEOUS
  Administered 2017-11-24: 11 [IU] via SUBCUTANEOUS
  Administered 2017-11-24 – 2017-11-25 (×2): 7 [IU] via SUBCUTANEOUS
  Administered 2017-11-25: 3 [IU] via SUBCUTANEOUS
  Administered 2017-11-25 (×2): 4 [IU] via SUBCUTANEOUS
  Administered 2017-11-25: 7 [IU] via SUBCUTANEOUS
  Administered 2017-11-25 – 2017-11-27 (×11): 4 [IU] via SUBCUTANEOUS
  Administered 2017-11-27: 3 [IU] via SUBCUTANEOUS
  Administered 2017-11-28: 4 [IU] via SUBCUTANEOUS
  Administered 2017-11-28: 7 [IU] via SUBCUTANEOUS
  Administered 2017-11-28: 3 [IU] via SUBCUTANEOUS
  Administered 2017-11-28: 7 [IU] via SUBCUTANEOUS
  Administered 2017-11-28: 3 [IU] via SUBCUTANEOUS
  Administered 2017-11-28 – 2017-11-29 (×3): 4 [IU] via SUBCUTANEOUS
  Administered 2017-11-29: 7 [IU] via SUBCUTANEOUS
  Administered 2017-11-29 (×2): 4 [IU] via SUBCUTANEOUS
  Administered 2017-11-29: 7 [IU] via SUBCUTANEOUS
  Administered 2017-11-30: 3 [IU] via SUBCUTANEOUS

## 2017-11-22 MED ORDER — ACETAMINOPHEN 10 MG/ML IV SOLN
650.0000 mg | Freq: Four times a day (QID) | INTRAVENOUS | Status: AC
  Administered 2017-11-23 (×2): 1000 mg via INTRAVENOUS
  Administered 2017-11-23: via INTRAVENOUS
  Administered 2017-11-23: 650 mg via INTRAVENOUS
  Filled 2017-11-22 (×4): qty 100

## 2017-11-22 MED ORDER — KCL IN DEXTROSE-NACL 20-5-0.2 MEQ/L-%-% IV SOLN: INTRAVENOUS | Status: DC

## 2017-11-22 MED ORDER — SODIUM CHLORIDE 0.45 % IV BOLUS
500.0000 mL | Freq: Once | INTRAVENOUS | Status: AC
  Administered 2017-11-22: 500 mL via INTRAVENOUS

## 2017-11-22 NOTE — Progress Notes (Signed)
[name] CONSULT NOTE   Pharmacy Consult for TPN Indication: Prolonged Ileus  Patient Measurements: Height: 5' 5.98" (167.6 cm) Weight: 195 lb 1.6 oz (88.5 kg) IBW/kg (Calculated) : 63.76 TPN AdjBW (KG): 69.9 Body mass index is 31.51 kg/m.  Assessment:  84 yo M presents on 10/15 with a L inguinal hernia. Surgery took to OR to repair on 10/16. Unfortunately he developed a hematoma at the operative site. Was then to be discharged on 10/18 but his wound started draining seroma fluid so kept inpatient. Then developed N/V and found to have an ileus. Has been getting conservative management but now has been over a week out from initial surgery and not tolerating CLD. Pharmacy consulted to start TPN.  GI: TPN started for prolonged ileus / poor PO intake. Prealbumin is low at 7. Complaining of abdominal pain and no flatus. Endo: Hx of DMs. CBGs are not controlled (260-340s) on SSI Insulin requirements in the past 24 hours: 51 units Lytes: wnl exc Na 155, Cl 126. Renal: SCr up to 2.02, BUN 49. UOP is ok. Net negative this admit. Pulm: 2L of [place] Cards: BP soft and HR ok Hepatobil: No CMET this admit yet. Follow up tomorrow Neuro: ID: On Zosyn. Afebrile, WBC up to 19.3.  TPN Access: PICC ordered for 10/28 for IR to place TPN start date: 10/28  Nutritional Goals (per RD recommendation on ): KCal: 2,100-2,300 Protein: 100-120 g Fluid: > 2.5 L   Goal TPN rate is 85 ml/hr  Current Nutrition:  CLD  Plan:  IR plans to place PICC line today  Start TPN at 78mL/hr This TPN provides 55 g of protein, 144 g of dextrose, and 29 g of lipids which provides 996 kCals per day, meeting ~50% of patient needs Electrolytes in TPN: No Na, reduce K. Cl:Ac, max acetate Add MVI, trace elements, 25 units of regular insulin, and famotidine 20mg  to TPN Stop IV famotidine Change to resistant SSI and adjust as needed Decrease D5/0.2NS with 81mEq of KCl to 6mL/hr tonight when  TPN starts Monitor TPN labs F/U improvement in PO intake  Michael [name] J 11/22/2017,8:21 AM

## 2017-11-22 NOTE — Progress Notes (Signed)
Patient ID: Michael [name], male   DOB: [birth year], 84 y.o.   MRN: [id number]   Xray with dilated bowel Will have NG placed as I'm worried he could develop emesis and aspirate  As PICC line team can not place a line, will have to consult IR for PICC

## 2017-11-22 NOTE — Progress Notes (Signed)
   Patient Status: [facility] - In-pt  Assessment and Plan: Patient in need of venous access.   Tunneled IJ central catheter placement ______________________________________________________________________   History of Present Illness: Eames Dibiasio is a 84 y.o. male   Recent bowel surgery Ischemic bowel Post op ileus Need for nutrition/TPN Cr 2.02:  Renal failure    Allergies and medications reviewed.   Review of Systems: A 12 point ROS discussed and pertinent positives are indicated in the HPI above.  All other systems are negative.   Vital Signs: BP (!) 91/55 (BP Location: Left Arm)   Pulse 78   Temp 98.7 F (37.1 C) (Oral)   Resp 18   Ht 5' 5.98" (1.676 m)   Wt 195 lb 1.6 oz (88.5 kg)   SpO2 98%   BMI 31.51 kg/m   Physical Exam  Cardiovascular: Normal rate and regular rhythm.  Pulmonary/Chest: Effort normal and breath sounds normal.  Abdominal: Soft. There is tenderness.  Skin: Skin is warm and dry.  Psychiatric:  Consented with Dtr at bedside  Vitals reviewed.    Imaging reviewed.   Labs:  COAGS: No results for input(s): INR, APTT in the last 8760 hours.  BMP: Recent Labs    11/18/17 0355 11/20/17 0400 11/21/17 0321 11/22/17 0336  NA 152* 155* 156* 155*  K 4.4 3.8 3.9 3.9  CL 122* 119* 127* 126*  CO2 24 24 25 24   GLUCOSE 173* 179* 330* 272*  BUN 50* 48* 45* 49*  CALCIUM 8.3* 8.2* 7.5* 7.6*  CREATININE 1.67* 1.48* 1.74* 2.02*  GFRNONAA 36* 41* 34* 28*  GFRAA 41* 48* 39* 33*    Post op ileus Malnutrition Need for central line/TPN Acute renal failure Scheduled for tunneled IJ central catheter placement pts daughter is aware of procedure benefits and risks Agreeable to proceed Consent signed and in chart    Electronically Signed: Kairy Folsom A, PA-C 11/22/2017, 11:52 AM   I spent a total of 15 minutes in face to face in clinical consultation, greater than 50% of which was counseling/coordinating care for venous access.Patient  ID: Loren Sawaya, male   DOB: 1930/02/22, 84 y.o.   MRN: [id number]

## 2017-11-22 NOTE — Progress Notes (Signed)
MD on call informed that patient is NPO with NG tube has scheduled Tylenol 650 mg po asked if medication can be given IV. New orders received will continue to monitor.Arthor Captain

## 2017-11-22 NOTE — Progress Notes (Signed)
Physical Therapy Treatment Patient Details Name: Michael [name] MRN: [id number] DOB: 01/04/1931 Today's Date: 11/22/2017    History of Present Illness Pt is a 84 y.o. M with significant PMH of CLL who presents s/p left inguinal hernia repair with mesh and small bowel resection    PT Comments    Patient received in bed, pleasant and willing to participate in PT session; family initially agreeable but then attempted to convince PT and mobility tech not to have session despite patient willing to participate and RN reporting patient OK to participate- extensive education provided on importance of participation in activity as able to reduce pain, reduce joint stiffness, and assist in preventing blood clots. Family then became hostile towards PT, stating "you'd better get away from me before I..." even though PT was standing over 44f away during education; family then allowed patient to participate in session as patient was still agreeable. Performed functional bed exercises as tolerated, then patient was able to sit up at side of bed with ModAx2; he initially required ModA to maintain upright sitting at bedside, however this faded to close S for safety. He required MaxAx2 for partial stand to clear hips from bed and scoot up to head of the bed, and MaxA for rolling for repositioning in bed. He was left in bed with all needs met, bed alarm active, and family present.    Follow Up Recommendations  SNF     Equipment Recommendations  Other (comment)(defer to next venue )    Recommendations for Other Services       Precautions / Restrictions Precautions Precautions: Fall Restrictions Weight Bearing Restrictions: No    Mobility  Bed Mobility Overal bed mobility: Needs Assistance Bed Mobility: Supine to Sit;Rolling Rolling: Max assist   Supine to sit: Mod assist;+2 for physical assistance     General bed mobility comments: ModAx2 to bring legs over side of bed and trunk up to EOB,  initially ModA to maintain balance sitting at EOB but faded to close S   Transfers Overall transfer level: Needs assistance   Transfers: Sit to/from Stand Sit to Stand: Max assist;+2 physical assistance         General transfer comment: partial stand to shift hips L up the bed, one person in front and one person behind to boost hips   Ambulation/Gait             General Gait Details: unable, pain limited    Stairs             Wheelchair Mobility    Modified Rankin (Stroke Patients Only)       Balance Overall balance assessment: Needs assistance Sitting-balance support: Feet supported;Bilateral upper extremity supported Sitting balance-Leahy Scale: Fair   Postural control: Posterior lean;Right lateral lean                                  Cognition Arousal/Alertness: Awake/alert Behavior During Therapy: Flat affect Overall Cognitive Status: Impaired/Different from baseline Area of Impairment: Orientation;Attention;Memory;Following commands;Safety/judgement;Awareness;Problem solving                 Orientation Level: Place;Time;Situation Current Attention Level: Sustained Memory: Decreased recall of precautions;Decreased short-term memory Following Commands: Follows one step commands with increased time;Follows multi-step commands inconsistently Safety/Judgement: Decreased awareness of safety;Decreased awareness of deficits Awareness: Intellectual Problem Solving: Slow processing;Difficulty sequencing;Requires verbal cues        Exercises      General  Comments General comments (skin integrity, edema, etc.): patient pleasant and willing to participate in therapy, family initially agreeable to session then became hostile towards therapist       Pertinent Vitals/Pain Pain Assessment: Faces Faces Pain Scale: Hurts little more Pain Location: generalized, mostly in R LE  Pain Descriptors / Indicators:  Grimacing;Guarding;Moaning Pain Intervention(s): Limited activity within patient's tolerance;Monitored during session    Home Living                      Prior Function            PT Goals (current goals can now be found in the care plan section) Acute Rehab PT Goals Patient Stated Goal: "go to rehab if I need to." PT Goal Formulation: With patient Time For Goal Achievement: 12/01/17 Potential to Achieve Goals: Good Progress towards PT goals: Progressing toward goals    Frequency    Min 2X/week      PT Plan Current plan remains appropriate    Co-evaluation              AM-PAC PT "6 Clicks" Daily Activity  Outcome Measure  Difficulty turning over in bed (including adjusting bedclothes, sheets and blankets)?: Unable Difficulty moving from lying on back to sitting on the side of the bed? : Unable Difficulty sitting down on and standing up from a chair with arms (e.g., wheelchair, bedside commode, etc,.)?: Unable Help needed moving to and from a bed to chair (including a wheelchair)?: Total Help needed walking in hospital room?: Total Help needed climbing 3-5 steps with a railing? : Total 6 Click Score: 6    End of Session Equipment Utilized During Treatment: Oxygen Activity Tolerance: Patient limited by pain;Patient limited by fatigue Patient left: in bed;with call bell/phone within reach;with bed alarm set   PT Visit Diagnosis: Unsteadiness on feet (R26.81);Muscle weakness (generalized) (M62.81);Difficulty in walking, not elsewhere classified (R26.2);Pain Pain - part of body: (abdominal )     Time: 1136-1205 PT Time Calculation (min) (ACUTE ONLY): 29 min  Charges:  $Therapeutic Activity: 23-37 mins                     Kristen Unger PT, DPT, CBIS  Supplemental Physical Therapist [place]    Pager [phone] Acute Rehab Office [phone]    

## 2017-11-22 NOTE — Consult Note (Signed)
Reason for Consult: Acute Kidney Injury Referring Physician: Dr. Coralie Keens  Chief Complaint: Abdominal pain  Assessment/Plan: 1. Renal failure - baseline creatinine is 1 and now with worsening and fluctuating renal function over the past 1-1/2 weeks. Microscopy suggests ATN which would be consistent with possible infection, volume down (based on history and I&O during the current hospitalization) + recent relative hypotension. - Fortunately no acute indication for dialysis. - Will change fluids to D5W to help manage the hypernatremia and hopefully he will tolerate the fluid administration. UOP is good; will hold the Lasix also as he appears to be volume down on exam. - Renal ultrasound to rule out obstruction as a culprit  (lower on differential)., 2. Hypernatremia - free water deficit of 4.7L, unclear what ongoing free water losses are - Given chronicity will give 2.5L of free water / next 24hrs and calculate ongoing free water losses. 3. Hernia s/p repair 4. Wound infection? - on zosyn 5. CLL 6. DM 7. Anemia    HPI: Michael [name] is an 84 y.o. male with open repair of an incarcerated left inguinal hernia repair 10/16 with Prolene mesh w/ CT showing recurrent hernia w/ small bowel. He went to the OR on 11/15/2017 w/ finding of a foot of small bowel which was ischemia after having slipped under the mesh req a small bowel resection. By I&O's he's neg [phone] during this current hospitalization + hypernatremia with worsening renal function which has been fluctuating. He's being treated for a wound infection w/ Zosyn and in the past 1-2 days has had relative hypotension with persistent hypernatremia. He is not consuming much and his daughter who is bedside confirms that he is not drinking much either. He's mainly in pain req narcs. SNa is 155 at time of consultationand last normal Sna was 145 on 10/22 but had been trending up since 10/19. During hospitalization he is net neg 6078.9  mL.  ROS Pertinent items are noted in HPI.  Chemistry and CBC: Creatinine  Date/Time Value Ref Range Status  10/09/2016 11:06 AM 1.1 0.7 - 1.3 mg/dL Final  08/10/2016 12:48 PM 1.2 0.7 - 1.3 mg/dL Final  04/10/2016 10:56 AM 1.3 0.7 - 1.3 mg/dL Final  01/08/2016 12:30 PM 1.0 0.7 - 1.3 mg/dL Final  10/14/2015 12:39 PM 1.0 0.7 - 1.3 mg/dL Final  06/17/2015 10:15 AM 1.0 0.7 - 1.3 mg/dL Final  05/20/2015 10:47 AM 1.1 0.7 - 1.3 mg/dL Final  05/13/2015 10:14 AM 0.9 0.7 - 1.3 mg/dL Final  05/06/2015 10:41 AM 1.1 0.7 - 1.3 mg/dL Final  04/22/2015 11:33 AM 1.1 0.7 - 1.3 mg/dL Final  10/23/2014 11:07 AM 1.1 0.7 - 1.3 mg/dL Final  10/11/2014 11:16 AM 1.0 0.7 - 1.3 mg/dL Final  01/11/2014 12:27 PM 1.1 0.7 - 1.3 mg/dL Final  07/03/2013 10:17 AM 1.0 0.7 - 1.3 mg/dL Final  01/03/2013 10:37 AM 1.1 0.7 - 1.3 mg/dL Final  09/29/2012 10:18 AM 1.0 0.7 - 1.3 mg/dL Final  03/28/2012 02:26 PM 1.1 0.7 - 1.3 mg/dL Final  11/30/2011 12:26 PM 0.9 0.7 - 1.3 mg/dL Final   Creatinine, Ser  Date/Time Value Ref Range Status  11/22/2017 03:36 AM 2.02 (H) 0.61 - 1.24 mg/dL Final  11/21/2017 03:21 AM 1.74 (H) 0.61 - 1.24 mg/dL Final  11/20/2017 04:00 AM 1.48 (H) 0.61 - 1.24 mg/dL Final  11/18/2017 03:55 AM 1.67 (H) 0.61 - 1.24 mg/dL Final  11/17/2017 02:37 AM 1.68 (H) 0.61 - 1.24 mg/dL Final  11/16/2017 04:18 AM 2.06 (H) 0.61 -  1.24 mg/dL Final  11/14/2017 08:21 AM 1.82 (H) 0.61 - 1.24 mg/dL Final  11/13/2017 03:03 AM 1.03 0.61 - 1.24 mg/dL Final  11/04/2017 09:20 AM 1.06 0.61 - 1.24 mg/dL Final  07/27/2017 12:30 PM 1.13 0.61 - 1.24 mg/dL Final  04/12/2017 10:04 AM 1.03 0.70 - 1.30 mg/dL Final  10/21/2016 08:12 AM 0.90 0.61 - 1.24 mg/dL Final  10/19/2016 01:56 PM 1.10 0.61 - 1.24 mg/dL Final  10/19/2016 01:38 PM 1.17 0.61 - 1.24 mg/dL Final  03/04/2016 11:55 AM 1.04 0.61 - 1.24 mg/dL Final  08/06/2011 02:11 PM 0.95 0.50 - 1.35 mg/dL Final  04/07/2011 11:37 AM 0.90 0.50 - 1.35 mg/dL Final  12/08/2010 09:03  AM 1.02 0.50 - 1.35 mg/dL Final  08/07/2010 09:25 AM 0.97 0.50 - 1.35 mg/dL Final  04/08/2010 09:24 AM 1.08 0.40 - 1.50 mg/dL Final  10/04/2009 08:36 AM 1.04 0.40 - 1.50 mg/dL Final  04/04/2009 08:50 AM 1.02 0.40 - 1.50 mg/dL Final  04/03/2008 09:00 AM 1.03 0.40 - 1.50 mg/dL Final  04/04/2007 10:03 AM 0.86 0.40 - 1.50 mg/dL Final  02/16/2007 08:05 AM 1.28  Final  04/05/2006 10:33 AM 1.03 0.40 - 1.50 mg/dL Final   Recent Labs  Lab 11/16/17 0418 11/17/17 0237 11/18/17 0355 11/20/17 0400 11/21/17 0321 11/22/17 0336  NA 145 147* 152* 155* 156* 155*  K 4.9 4.2 4.4 3.8 3.9 3.9  CL 107 113* 122* 119* 127* 126*  CO2 '27 28 24 24 25 24  '$ GLUCOSE 128* 143* 173* 179* 330* 272*  BUN 43* 52* 50* 48* 45* 49*  CREATININE 2.06* 1.68* 1.67* 1.48* 1.74* 2.02*  CALCIUM 8.5* 8.3* 8.3* 8.2* 7.5* 7.6*   Recent Labs  Lab 11/16/17 0418 11/18/17 0355 11/21/17 0321 11/22/17 0336  WBC 16.5* 16.0* 17.7* 19.3*  NEUTROABS  --   --  12.1* 16.6*  HGB 10.5* 10.0* 8.1* 8.3*  HCT 35.5* 33.2* 28.1* 29.3*  MCV 86.0 87.6 89.5 90.4  PLT 222 230 298 351   Liver Function Tests: No results for input(s): AST, ALT, ALKPHOS, BILITOT, PROT, ALBUMIN in the last 168 hours. No results for input(s): LIPASE, AMYLASE in the last 168 hours. No results for input(s): AMMONIA in the last 168 hours. Cardiac Enzymes: No results for input(s): CKTOTAL, CKMB, CKMBINDEX, TROPONINI in the last 168 hours. Iron Studies: No results for input(s): IRON, TIBC, TRANSFERRIN, FERRITIN in the last 72 hours. PT/INR: '@LABRCNTIP'$ (inr:5)  Xrays/Other Studies: ) Results for orders placed or performed during the hospital encounter of 11/10/17 (from the past 48 hour(s))  Glucose, capillary     Status: Abnormal   Collection Time: 11/20/17 11:48 AM  Result Value Ref Range   Glucose-Capillary 139 (H) 70 - 99 mg/dL  Glucose, capillary     Status: Abnormal   Collection Time: 11/20/17  4:02 PM  Result Value Ref Range   Glucose-Capillary 205 (H)  70 - 99 mg/dL  Glucose, capillary     Status: Abnormal   Collection Time: 11/20/17  8:10 PM  Result Value Ref Range   Glucose-Capillary 212 (H) 70 - 99 mg/dL  Glucose, capillary     Status: Abnormal   Collection Time: 11/21/17  1:00 AM  Result Value Ref Range   Glucose-Capillary 264 (H) 70 - 99 mg/dL  Basic metabolic panel     Status: Abnormal   Collection Time: 11/21/17  3:21 AM  Result Value Ref Range   Sodium 156 (H) 135 - 145 mmol/L   Potassium 3.9 3.5 - 5.1 mmol/L   Chloride 127 (  H) 98 - 111 mmol/L   CO2 25 22 - 32 mmol/L   Glucose, Bld 330 (H) 70 - 99 mg/dL   BUN 45 (H) 8 - 23 mg/dL   Creatinine, Ser 1.74 (H) 0.61 - 1.24 mg/dL   Calcium 7.5 (L) 8.9 - 10.3 mg/dL   GFR calc non Af Amer 34 (L) >60 mL/min   GFR calc Af Amer 39 (L) >60 mL/min    Comment: (NOTE) The eGFR has been calculated using the CKD EPI equation. This calculation has not been validated in all clinical situations. eGFR's persistently <60 mL/min signify possible Chronic Kidney Disease.    Anion gap 4 (L) 5 - 15    Comment: Performed at [facility] [address], [place], [place] [address]  CBC with Differential/Platelet     Status: Abnormal   Collection Time: 11/21/17  3:21 AM  Result Value Ref Range   WBC 17.7 (H) 4.0 - 10.5 K/uL   RBC 3.14 (L) 4.22 - 5.81 MIL/uL   Hemoglobin 8.1 (L) 13.0 - 17.0 g/dL   HCT 28.1 (L) 39.0 - 52.0 %   MCV 89.5 80.0 - 100.0 fL   MCH 25.8 (L) 26.0 - 34.0 pg   MCHC 28.8 (L) 30.0 - 36.0 g/dL   RDW 17.6 (H) 11.5 - 15.5 %   Platelets 298 150 - 400 K/uL   nRBC 0.0 0.0 - 0.2 %   Neutrophils Relative % 68 %   Neutro Abs 12.1 (H) 1.7 - 7.7 K/uL   Lymphocytes Relative 14 %   Lymphs Abs 2.4 0.7 - 4.0 K/uL   Monocytes Relative 15 %   Monocytes Absolute 2.7 (H) 0.1 - 1.0 K/uL   Eosinophils Relative 1 %   Eosinophils Absolute 0.2 0.0 - 0.5 K/uL   Basophils Relative 0 %   Basophils Absolute 0.0 0.0 - 0.1 K/uL   Immature Granulocytes 2 %   Abs Immature Granulocytes 0.28  (H) 0.00 - 0.07 K/uL    Comment: Performed at [facility] [address] [place], [place] [address]  Glucose, capillary     Status: Abnormal   Collection Time: 11/21/17  4:11 AM  Result Value Ref Range   Glucose-Capillary 296 (H) 70 - 99 mg/dL  Glucose, capillary     Status: Abnormal   Collection Time: 11/21/17  8:09 AM  Result Value Ref Range   Glucose-Capillary 261 (H) 70 - 99 mg/dL  Glucose, capillary     Status: Abnormal   Collection Time: 11/21/17  1:01 PM  Result Value Ref Range   Glucose-Capillary 276 (H) 70 - 99 mg/dL  Glucose, capillary     Status: Abnormal   Collection Time: 11/21/17  4:54 PM  Result Value Ref Range   Glucose-Capillary 340 (H) 70 - 99 mg/dL  Glucose, capillary     Status: Abnormal   Collection Time: 11/21/17  9:00 PM  Result Value Ref Range   Glucose-Capillary 344 (H) 70 - 99 mg/dL  Glucose, capillary     Status: Abnormal   Collection Time: 11/22/17 12:36 AM  Result Value Ref Range   Glucose-Capillary 266 (H) 70 - 99 mg/dL  Glucose, capillary     Status: Abnormal   Collection Time: 11/22/17  3:25 AM  Result Value Ref Range   Glucose-Capillary 239 (H) 70 - 99 mg/dL  CBC with Differential/Platelet     Status: Abnormal   Collection Time: 11/22/17  3:36 AM  Result Value Ref Range   WBC 19.3 (H)  4.0 - 10.5 K/uL   RBC 3.24 (L) 4.22 - 5.81 MIL/uL   Hemoglobin 8.3 (L) 13.0 - 17.0 g/dL   HCT 29.3 (L) 39.0 - 52.0 %   MCV 90.4 80.0 - 100.0 fL   MCH 25.6 (L) 26.0 - 34.0 pg   MCHC 28.3 (L) 30.0 - 36.0 g/dL   RDW 17.7 (H) 11.5 - 15.5 %   Platelets 351 150 - 400 K/uL   nRBC 0.0 0.0 - 0.2 %   Neutrophils Relative % 84 %   Neutro Abs 16.6 (H) 1.7 - 7.7 K/uL   Band Neutrophils 1 %   Lymphocytes Relative 9 %   Lymphs Abs 1.7 0.7 - 4.0 K/uL   Monocytes Relative 4 %   Monocytes Absolute 0.8 0.1 - 1.0 K/uL   Eosinophils Relative 1 %   Eosinophils Absolute 0.2 0.0 - 0.5 K/uL   Basophils Relative 0 %   Basophils Absolute 0.0 0.0 - 0.1 K/uL   nRBC 0 0  /100 WBC   Metamyelocytes Relative 1 %   Burr Cells PRESENT    Polychromasia PRESENT     Comment: Performed at [facility], [place] [address] [place], [place] [address]  Basic metabolic panel     Status: Abnormal   Collection Time: 11/22/17  3:36 AM  Result Value Ref Range   Sodium 155 (H) 135 - 145 mmol/L   Potassium 3.9 3.5 - 5.1 mmol/L   Chloride 126 (H) 98 - 111 mmol/L   CO2 24 22 - 32 mmol/L   Glucose, Bld 272 (H) 70 - 99 mg/dL   BUN 49 (H) 8 - 23 mg/dL   Creatinine, Ser 2.02 (H) 0.61 - 1.24 mg/dL   Calcium 7.6 (L) 8.9 - 10.3 mg/dL   GFR calc non Af Amer 28 (L) >60 mL/min   GFR calc Af Amer 33 (L) >60 mL/min    Comment: (NOTE) The eGFR has been calculated using the CKD EPI equation. This calculation has not been validated in all clinical situations. eGFR's persistently <60 mL/min signify possible Chronic Kidney Disease.    Anion gap 5 5 - 15    Comment: Performed at [facility] [address], [place], [place] [address]  Prealbumin     Status: Abnormal   Collection Time: 11/22/17  3:36 AM  Result Value Ref Range   Prealbumin 7.0 (L) 18 - 38 mg/dL    Comment: Performed at [facility] [name], [place], [place] [address]  Glucose, capillary     Status: Abnormal   Collection Time: 11/22/17  8:31 AM  Result Value Ref Range   Glucose-Capillary 269 (H) 70 - 99 mg/dL   Dg Chest Port 1 View  Result Date: 11/22/2017 CLINICAL DATA:  Abnormal white blood cell count EXAM: PORTABLE CHEST 1 VIEW COMPARISON:  03/04/2016; chest CT-04/26/2017 FINDINGS: Grossly unchanged cardiac silhouette and mediastinal contours given reduced lung volumes and AP projection. Pulmonary vasculature appears less distinct than present examination with cephalization flow. Worsening bibasilar heterogeneous opacities, left greater than right. A trace left-sided pleural effusion is not excluded. No pneumothorax. No definite acute osseus abnormalities. Degenerative change of the bilateral  glenohumeral joints is suspected though incompletely evaluated. IMPRESSION: Findings suggestive of pulmonary edema, bibasilar atelectasis and suspected trace left-sided effusion on this hypoventilated AP portable examination. Further evaluation with a PA and lateral chest radiograph may be obtained as clinically indicated. Electronically Signed   By: Sandi Mariscal M.D.   On: 11/22/2017 08:01  Dg Abd Portable 1v  Result Date: 11/22/2017 CLINICAL DATA:  Abdominal distention EXAM: PORTABLE ABDOMEN - 1 VIEW COMPARISON:  11/14/2017; CT abdomen pelvis-11/15/2017 FINDINGS: There is persistent moderate to marked gaseous distention multiple loops of small bowel with index loop within mid aspect of the abdomen measuring approximately 4.8 cm in diameter. This finding is again associated with a conspicuous paucity of distal colonic gas. Moderate stool burden is seen within the rectal vault. Skin staples overlie the left groin. No definitive abnormal intra-abdominal calcifications. Degenerative change of the lower lumbar spine is suspected though incompletely evaluated. IMPRESSION: Similar findings worrisome for persistent small bowel obstruction. Continued attention on follow-up is advised. Electronically Signed   By: Sandi Mariscal M.D.   On: 11/22/2017 07:59   [place] Ekg Site Rite  Result Date: 11/22/2017 If Site Rite image not attached, placement could not be confirmed due to current cardiac rhythm.   PMH:   Past Medical History:  Diagnosis Date  . Anemia   . Anginal pain (HCC)    upon exertion  . Arthritis    "left shoulder, lower back" (10/20/2016)  . CLL (chronic lymphoblastic leukemia) dx'd 2000  . Edema leg   . Family history of adverse reaction to anesthesia    "daughter's BP drops"   . GERD (gastroesophageal reflux disease)   . History of hiatal hernia   . Leukemia, chronic lymphoid ([facility]) 12/08/2010  . Lymphocytosis   . Type II diabetes mellitus (HCC)     PSH:   Past Surgical History:   Procedure Laterality Date  . BOWEL RESECTION N/A 11/15/2017   Procedure: SMALL BOWEL RESECTION;  Surgeon: Coralie Keens, MD;  Location: [facility];  Service: General;  Laterality: N/A;  . CATARACT EXTRACTION W/ INTRAOCULAR LENS  IMPLANT, BILATERAL Bilateral   . EYE SURGERY     Cataracts (bilateral)  . GROIN DISSECTION Left 11/15/2017   Procedure: GROIN EXPLORATION WITH REMOVAL OF INGUINAL HERNIA MESH;  Surgeon: Coralie Keens, MD;  Location: [facility];  Service: General;  Laterality: Left;  . INGUINAL HERNIA REPAIR Left 11/10/2017   Procedure: LEFT INGUINAL HERNIA REPAIR ERAS PATHWAY;  Surgeon: Coralie Keens, MD;  Location: [facility];  Service: General;  Laterality: Left;  . INGUINAL HERNIA REPAIR Left 11/15/2017   Procedure: LEFT INGUINAL HERNIA REPAIR WITH MESH;  Surgeon: Coralie Keens, MD;  Location: [facility];  Service: General;  Laterality: Left;  . INSERTION OF MESH Left 11/10/2017   Procedure: INSERTION OF MESH;  Surgeon: Coralie Keens, MD;  Location: [facility];  Service: General;  Laterality: Left;    Allergies: No Known Allergies  Medications:   Prior to Admission medications   Medication Sig Start Date End Date Taking? Authorizing Provider  acetaminophen (TYLENOL) 650 MG CR tablet Take 1,300 mg by mouth 2 (two) times daily as needed for pain.   Yes [provider]  aspirin EC 81 MG tablet Take 81 mg by mouth daily.   Yes [provider]  Cholecalciferol (VITAMIN D3) 1000 units CAPS Take 1,000 Units by mouth 2 (two) times daily.    Yes [provider]  docusate sodium (COLACE) 50 MG capsule Take 50 mg by mouth 2 (two) times daily as needed for moderate constipation.    Yes [provider]  IMBRUVICA 140 MG capsul TAKE 2 CAPSULES (280 MG TOTAL) BY MOUTH DAILY. Patient taking differently: Take 280 mg by mouth daily.  10/18/17  Yes Gorsuch, Ni, MD  insulin NPH-insulin regular (NOVOLIN 70/30) (70-30) 100 UNIT/ML injection Inject 15-20  Units into the  skin 2 (two) times daily. Sliding scale   Yes [provider]  metFORMIN (GLUCOPHAGE) 1000 MG tablet Take 1,000 mg by mouth 2 (two) times daily with a meal.    Yes [provider]  morphine (MSIR) 15 MG tablet Take 15 mg by mouth 2 (two) times daily.  10/01/16  Yes [provider]  Multiple Vitamin (MULTIVITAMIN WITH MINERALS) TABS tablet Take 1 tablet by mouth daily.   Yes [provider]  pantoprazole (PROTONIX) 40 MG tablet Take 1 tablet (40 mg total) by mouth daily. 04/10/16  Yes Gorsuch, Ernst Spell, MD  Polyethyl Glycol-Propyl Glycol (SYSTANE OP) Place 1 drop into both eyes 2 (two) times daily.   Yes [provider]  ramipril (ALTACE) 5 MG capsule Take 5 mg by mouth daily. 10/16/16  Yes [provider]  Tamsulosin HCl (FLOMAX) 0.4 MG CAPS Take 0.4 mg by mouth daily after supper.    Yes [provider]  torsemide (DEMADEX) 20 MG tablet Take 20 mg by mouth daily after supper.    Yes [provider]  vitamin B-12 (CYANOCOBALAMIN) 1000 MCG tablet Take 1,000 mcg by mouth daily.   Yes [provider]  oxyCODONE (OXY IR/ROXICODONE) 5 MG immediate release tablet Take 1 tablet (5 mg total) by mouth every 6 (six) hours as needed for moderate pain, severe pain or breakthrough pain. 11/12/17   Coralie Keens, MD  insulin aspart protamine-insulin aspart (NOVOLOG 70/30) (70-30) 100 UNIT/ML injection Inject 35 Units into the skin 2 (two) times daily with a meal.   04/07/11  [provider]  sitaGLIPtin (JANUVIA) 100 MG tablet Take 100 mg by mouth daily.    04/07/11  [provider]    Discontinued Meds:   Medications Discontinued During This Encounter  Medication Reason  . acetaminophen (TYLENOL) 325 MG tablet Dose change  . ibuprofen (ADVIL,MOTRIN) 200 MG tablet Patient Preference  . bupivacaine-EPINEPHrine (MARCAINE W/ EPI) 0.5% -1:200000 (with pres) injection Patient Discharge  . 0.9 % irrigation (POUR BTL) Patient  Discharge  . Chlorhexidine Gluconate Cloth 2 % PADS 6 each Patient Transfer  . Chlorhexidine Gluconate Cloth 2 % PADS 6 each Patient Transfer  . lactated ringers infusion   . enoxaparin (LOVENOX) injection 40 mg   . metoCLOPramide (REGLAN) injection 5 mg   . torsemide (DEMADEX) tablet 20 mg   . senna-docusate (Senokot-S) tablet 2 tablet   . polyethylene glycol (MIRALAX / GLYCOLAX) packet 17 g   . pantoprazole (PROTONIX) EC tablet 40 mg   . insulin aspart (novoLOG) injection 0-5 Units   . insulin aspart (novoLOG) injection 0-15 Units   . 0.9 % irrigation (POUR BTL) Patient Discharge  . bupivacaine-EPINEPHrine (MARCAINE W/ EPI) 0.5% -1:200000 (with pres) injection Patient Discharge  . 0.9 % NaCl with KCl 20 mEq/ L  infusion   . HYDROmorphone (DILAUDID) injection 1 mg   . lactated ringers infusion Patient Transfer  . fentaNYL (SUBLIMAZE) injection 25-50 mcg Patient Transfer  . ondansetron (ZOFRAN) injection 4 mg Patient Transfer  . ramipril (ALTACE) capsule 5 mg   . 0.9 % NaCl with KCl 20 mEq/ L  infusion   . acetaminophen (TYLENOL) tablet 650 mg   . 0.45 % sodium chloride infusion   . dextrose 5 % and 0.45 % NaCl with KCl 20 mEq/L infusion   . 0.45 % NaCl with KCl 20 mEq / L infusion   . acetaminophen (TYLENOL) tablet 650 mg   . insulin aspart (novoLOG) injection 0-15 Units   .  famotidine (PEPCID) IVPB 20 mg premix     Social History:  reports that he has never smoked. He quit smokeless tobacco use about 2 years ago.  His smokeless tobacco use included snuff. He reports that he drank alcohol. He reports that he does not use drugs.  Family History:   Family History  Problem Relation Age of Onset  . Cancer Mother   . Hypertension Father   . Diabetes Father     Blood pressure (!) 91/55, pulse 78, temperature 98.7 F (37.1 C), temperature source Oral, resp. rate 18, height 5' 5.98" (1.676 m), weight 88.5 kg, SpO2 98 %. General appearance: alert and appears stated age Head:  Normocephalic, without obvious abnormality, atraumatic Eyes: negative Neck: no adenopathy, no carotid bruit, supple, symmetrical, trachea midline and thyroid not enlarged, symmetric, no tenderness/mass/nodules Back: symmetric, no curvature. ROM normal. No CVA tenderness. Resp: poor air movement but no rales Chest wall: no tenderness Cardio: regular rate and rhythm, S1, S2 normal, no murmur, click, rub or gallop GI: decreased BS Extremities: edema none Pulses: 2+ and symmetric Skin: Skin color, texture, turgor normal. No rashes or lesions Lymph nodes: Cervical, supraclavicular, and axillary nodes normal. Neurologic: Mental status: On lethargic side from the narcs but can be aroused and oriented       Dwana Melena, MD 11/22/2017, 10:12 AM

## 2017-11-22 NOTE — Progress Notes (Signed)
Inpatient Diabetes Program Recommendations  AACE/ADA: New Consensus Statement on Inpatient Glycemic Control (2015)  Target Ranges:  Prepandial:   less than 140 mg/dL      Peak postprandial:   less than 180 mg/dL (1-2 hours)      Critically ill patients:  140 - 180 mg/dL   Lab Results  Component Value Date   GLUCAP 230 (H) 11/22/2017   HGBA1C 7.0 (H) 11/04/2017    Review of Glycemic Control Results for DIVINE, HANSLEY (MRN [id number]) as of 11/22/2017 13:29  Ref. Range 11/21/2017 21:00 11/22/2017 00:36 11/22/2017 03:25 11/22/2017 08:31 11/22/2017 12:50  Glucose-Capillary Latest Ref Range: 70 - 99 mg/dL 344 (H) 266 (H) 239 (H) 269 (H) 230 (H)   Diabetes history: Type 2 DM  Outpatient Diabetes medications:  Novolin 70/30  15-20 units bid, Metformin 1000 mg bid Current orders for Inpatient glycemic control:  Novolog resistant q 4 hours Inpatient Diabetes Program Recommendations:   Note plans to start TPN.   May also consider adding Lantus 12 units daily while patient is in the hospital.  Thanks,  Adah Perl, RN, BC-ADM Inpatient Diabetes Coordinator Pager [phone] (8a-5p)

## 2017-11-22 NOTE — Progress Notes (Signed)
7 Days Post-Op   Subjective/Chief Complaint: Complains of abdominal pain Not passing flatus   Objective: Vital signs in last 24 hours: Temp:  [98.4 F (36.9 C)-99.6 F (37.6 C)] 98.7 F (37.1 C) (10/28 0329) Pulse Rate:  [78-90] 78 (10/28 0329) Resp:  [18-22] 18 (10/28 0329) BP: (81-91)/(46-55) 91/55 (10/28 0329) SpO2:  [94 %-98 %] 98 % (10/28 0329) Last BM Date: 11/20/17  Intake/Output from previous day: 10/27 0701 - 10/28 0700 In: 2577.6 [P.O.:810; I.V.:1667.6; IV Piggyback:100] Out: 1525 [Urine:1525] Intake/Output this shift: Total I/O In: 1532.3 [P.O.:90; I.V.:1442.3] Out: 575 [Urine:575]  Exam: Awake, looks ill Lungs clear Abdomen distended, mildly tender  Lab Results:  Recent Labs    11/21/17 0321 11/22/17 0336  WBC 17.7* 19.3*  HGB 8.1* 8.3*  HCT 28.1* 29.3*  PLT 298 351   BMET Recent Labs    11/21/17 0321 11/22/17 0336  NA 156* 155*  K 3.9 3.9  CL 127* 126*  CO2 25 24  GLUCOSE 330* 272*  BUN 45* 49*  CREATININE 1.74* 2.02*  CALCIUM 7.5* 7.6*   PT/INR No results for input(s): LABPROT, INR in the last 72 hours. ABG No results for input(s): PHART, HCO3 in the last 72 hours.  Invalid input(s): PCO2, PO2  Studies/Results: No results found.  Anti-infectives: Anti-infectives (From admission, onward)   Start     Dose/Rate Route Frequency Ordered Stop   11/15/17 1400  piperacillin-tazobactam (ZOSYN) IVPB 3.375 g     3.375 g 12.5 mL/hr over 240 Minutes Intravenous Every 8 hours 11/15/17 0718     11/15/17 0800  piperacillin-tazobactam (ZOSYN) IVPB 3.375 g     3.375 g 100 mL/hr over 30 Minutes Intravenous  Once 11/15/17 0718 11/15/17 0923   11/10/17 1210  ceFAZolin (ANCEF) 2-4 GM/100ML-% IVPB    Note to Pharmacy:  [name], [place]   : cabinet override      11/10/17 1210 11/10/17 1451   11/10/17 1200  ceFAZolin (ANCEF) IVPB 2g/100 mL premix     2 g 200 mL/hr over 30 Minutes Intravenous On call to O.R. 11/10/17 1158 11/10/17 1501       Assessment/Plan: s/p Procedure(s): LEFT INGUINAL HERNIA REPAIR WITH MESH (Left) GROIN EXPLORATION WITH REMOVAL OF INGUINAL HERNIA MESH (Left) SMALL BOWEL RESECTION (N/A)  He is now a week out from surgery WBC is up as well as creatinine Will consult nephrology Check chest x-ray Check U/A Malnutrition.  PICC line and start TNA suppos KUB  LOS: 8 days    Johnda Billiot A 11/22/2017

## 2017-11-22 NOTE — Progress Notes (Signed)
Approval for PICC insertion obtained from Dr. Augustin Coupe nephrology via [place].  Carolee Rota, RN [facility]

## 2017-11-22 NOTE — Progress Notes (Signed)
Peripherally Inserted Central Catheter/Midline Placement  The IV Nurse has discussed with the patient and/or persons authorized to consent for the patient, the purpose of this procedure and the potential benefits and risks involved with this procedure.  The benefits include less needle sticks, lab draws from the catheter, and the patient may be discharged home with the catheter. Risks include, but not limited to, infection, bleeding, blood clot (thrombus formation), and puncture of an artery; nerve damage and irregular heartbeat and possibility to perform a PICC exchange if needed/ordered by physician.  Alternatives to this procedure were also discussed.  Bard Power PICC patient education guide, fact sheet on infection prevention and patient information card has been provided to patient /or left at bedside.    PICC/Midline Placement Documentation     Consent obtained with daughter at bedside   Darlyn Read 11/22/2017, 2:49 PM

## 2017-11-22 NOTE — Progress Notes (Signed)
Initial Nutrition Assessment  DOCUMENTATION CODES:   Not applicable  INTERVENTION:   - Initiate TPN   NUTRITION DIAGNOSIS:   Inadequate oral intake related to acute illness as evidenced by estimated needs, NPO status.  GOAL:   Patient will meet greater than or equal to 90% of their needs   MONITOR:   Diet advancement, Labs, Weight trends  REASON FOR ASSESSMENT:   Consult New TPN/TNA  ASSESSMENT:   84 yo male, admitted s/p L inguinal hernia repair. PMH significant for prolonged ileus, CHF, DM, GERD, hemorrhoids, HTN, prostate cancer, seizure disorder.   Labs: sodium 155, chloride 126, glucose 272, BUN 49, Creatinine 2.02, Ca 7.6, prealbumin 7.0, GFR 28/22, Hgb 8.3, Hct 29.3 Meds: lasix, novolog  Pt resting at time of visit, pleasantly confused when awake. Family present. UBW ~199#, family has not noticed significant weight loss, but suspects he has lost some given his poor PO intake.   Per nsg, line placement scheduled for this afternoon. Pt had some applesauce and water this morning. Pt is NPO now.   NUTRITION - FOCUSED PHYSICAL EXAM:    Most Recent Value  Orbital Region  Mild depletion  Upper Arm Region  Mild depletion  Thoracic and Lumbar Region  Unable to assess  Buccal Region  Mild depletion  Temple Region  Mild depletion  Clavicle Bone Region  Mild depletion  Clavicle and Acromion Bone Region  No depletion  Scapular Bone Region  Unable to assess  Dorsal Hand  Unable to assess  Patellar Region  Unable to assess  Anterior Thigh Region  Unable to assess  Posterior Calf Region  Unable to assess  Edema (RD Assessment)  Unable to assess  Hair  Reviewed  Eyes  Reviewed  Mouth  Reviewed  Skin  Reviewed  Nails  Reviewed       Diet Order:  0% of last 5 meals documented, per nsg Diet Order            Diet NPO time specified Except for: Sips with Meds  Diet effective now              EDUCATION NEEDS:  No education needs have been identified at this  time  Skin:  Skin Assessment: Reviewed RN Assessment  Last BM:  10/28, type 4  Height:  Ht Readings from Last 1 Encounters:  11/15/17 5' 5.98" (1.676 m)    Weight:  Wt Readings from Last 1 Encounters:  11/22/17 78.2 kg    Ideal Body Weight:  64.6 kg  BMI:  Body mass index is 27.84 kg/m.  Estimated Nutritional Needs:   Kcal:  2979 kcal/day(PSU2003b)  Protein:  89-106 g protein/day(1.0-1.2 g/kg ABW)  Fluid:  1 mL/kcal or per MD discretion  Althea Grimmer, MS, RDN, LDN On-call pager: [phone]

## 2017-11-23 ENCOUNTER — Inpatient Hospital Stay (HOSPITAL_COMMUNITY): Payer: Medicare HMO

## 2017-11-23 LAB — COMPREHENSIVE METABOLIC PANEL
ALT: 38 U/L (ref 0–44)
AST: 37 U/L (ref 15–41)
Albumin: 1.6 g/dL — ABNORMAL LOW (ref 3.5–5.0)
Alkaline Phosphatase: 67 U/L (ref 38–126)
Anion gap: 7 (ref 5–15)
BUN: 47 mg/dL — AB (ref 8–23)
CO2: 23 mmol/L (ref 22–32)
CREATININE: 1.7 mg/dL — AB (ref 0.61–1.24)
Calcium: 7.9 mg/dL — ABNORMAL LOW (ref 8.9–10.3)
Chloride: 124 mmol/L — ABNORMAL HIGH (ref 98–111)
GFR calc non Af Amer: 35 mL/min — ABNORMAL LOW (ref >60)
GFR, EST AFRICAN AMERICAN: 41 mL/min — AB (ref >60)
Glucose, Bld: 158 mg/dL — ABNORMAL HIGH (ref 70–99)
Potassium: 3.9 mmol/L (ref 3.5–5.1)
Sodium: 154 mmol/L — ABNORMAL HIGH (ref 135–145)
Total Bilirubin: 0.4 mg/dL (ref 0.3–1.2)
Total Protein: 4.8 g/dL — ABNORMAL LOW (ref 6.5–8.1)

## 2017-11-23 LAB — DIFFERENTIAL
Abs Immature Granulocytes: 0.2 10*3/uL — ABNORMAL HIGH (ref 0.00–0.07)
Basophils Absolute: 0 10*3/uL (ref 0.0–0.1)
Basophils Relative: 0 %
Eosinophils Absolute: 0.4 10*3/uL (ref 0.0–0.5)
Eosinophils Relative: 2 %
Immature Granulocytes: 1 %
LYMPHS ABS: 3.5 10*3/uL (ref 0.7–4.0)
LYMPHS PCT: 21 %
Monocytes Absolute: 1.7 10*3/uL — ABNORMAL HIGH (ref 0.1–1.0)
Monocytes Relative: 11 %
NEUTROS ABS: 10.5 10*3/uL — AB (ref 1.7–7.7)
Neutrophils Relative %: 65 %

## 2017-11-23 LAB — MAGNESIUM: MAGNESIUM: 2.8 mg/dL — AB (ref 1.7–2.4)

## 2017-11-23 LAB — TRIGLYCERIDES: Triglycerides: 132 mg/dL (ref <150)

## 2017-11-23 LAB — CBC
HEMATOCRIT: 28 % — AB (ref 39.0–52.0)
Hemoglobin: 7.9 g/dL — ABNORMAL LOW (ref 13.0–17.0)
MCH: 26 pg (ref 26.0–34.0)
MCHC: 28.2 g/dL — ABNORMAL LOW (ref 30.0–36.0)
MCV: 92.1 fL (ref 80.0–100.0)
NRBC: 0 % (ref 0.0–0.2)
Platelets: 367 10*3/uL (ref 150–400)
RBC: 3.04 MIL/uL — ABNORMAL LOW (ref 4.22–5.81)
RDW: 17.7 % — AB (ref 11.5–15.5)
WBC: 16.2 10*3/uL — ABNORMAL HIGH (ref 4.0–10.5)

## 2017-11-23 LAB — PHOSPHORUS: Phosphorus: 2.9 mg/dL (ref 2.5–4.6)

## 2017-11-23 LAB — BASIC METABOLIC PANEL
Anion gap: 6 (ref 5–15)
BUN: 44 mg/dL — ABNORMAL HIGH (ref 8–23)
CO2: 22 mmol/L (ref 22–32)
Calcium: 7.8 mg/dL — ABNORMAL LOW (ref 8.9–10.3)
Chloride: 121 mmol/L — ABNORMAL HIGH (ref 98–111)
Creatinine, Ser: 1.57 mg/dL — ABNORMAL HIGH (ref 0.61–1.24)
GFR calc non Af Amer: 39 mL/min — ABNORMAL LOW (ref >60)
GFR, EST AFRICAN AMERICAN: 45 mL/min — AB (ref >60)
Glucose, Bld: 336 mg/dL — ABNORMAL HIGH (ref 70–99)
Potassium: 3.8 mmol/L (ref 3.5–5.1)
SODIUM: 149 mmol/L — AB (ref 135–145)

## 2017-11-23 LAB — GLUCOSE, CAPILLARY
GLUCOSE-CAPILLARY: 131 mg/dL — AB (ref 70–99)
GLUCOSE-CAPILLARY: 181 mg/dL — AB (ref 70–99)
GLUCOSE-CAPILLARY: 322 mg/dL — AB (ref 70–99)
Glucose-Capillary: 212 mg/dL — ABNORMAL HIGH (ref 70–99)
Glucose-Capillary: 256 mg/dL — ABNORMAL HIGH (ref 70–99)
Glucose-Capillary: 331 mg/dL — ABNORMAL HIGH (ref 70–99)

## 2017-11-23 LAB — PREALBUMIN: Prealbumin: 7.4 mg/dL — ABNORMAL LOW (ref 18–38)

## 2017-11-23 MED ORDER — BISACODYL 10 MG RE SUPP
10.0000 mg | Freq: Once | RECTAL | Status: AC
  Administered 2017-11-23: 10 mg via RECTAL
  Filled 2017-11-23: qty 1

## 2017-11-23 MED ORDER — TRAVASOL 10 % IV SOLN
INTRAVENOUS | Status: AC
  Administered 2017-11-23: 18:00:00 via INTRAVENOUS
  Filled 2017-11-23: qty 720

## 2017-11-23 MED ORDER — METOCLOPRAMIDE HCL 5 MG/ML IJ SOLN
10.0000 mg | Freq: Once | INTRAMUSCULAR | Status: AC
  Administered 2017-11-23: 10 mg via INTRAVENOUS
  Filled 2017-11-23: qty 2

## 2017-11-23 NOTE — Progress Notes (Signed)
[facility] Progress Note    Assessment/ Plan:   84 y.o. male with open repair of an incarcerated left inguinal hernia repair 10/16 with Prolene mesh w/ CT showing recurrent hernia w/ small bowel -> 11/15/2017 revision with partial resection of small bowel for ischemia. Being seen for hypernatremia and AKI.   1. Renal failure - baseline creatinine is 1 and now with worsening and fluctuating renal function over the past 1-1/2 weeks. Microscopy suggests ATN which would be consistent with possible infection, volume down (based on history and I&O during the current hospitalization) + recent relative hypotension. - Fortunately no acute indication for dialysis. - Renal function trending in right direction but free water is off at time of exam and pt only received under 674ml since yesterday.  2. Hypernatremia - free water deficit of 4.7L, unclear what ongoing free water losses are but appears to be mainly from the NG based on volumes. - Given chronicity will have goal of ~146-148 for the SNa tomorrow. - Restart D5W at 11ml/hr -> recheck chem panel later this afternoon. - Amount of urine not impressive (low likely bec of the NGT output) and most of free water losses from NGT. 3. Hernia s/p repair 4. Wound infection? - on zosyn 5. CLL 6. DM 7. Anemia  Subjective:   Feeling a little better today with less discomfort in the abdomen c/w yesterday. Denies f/c/n/v/dyspnea   Objective:   BP (!) 101/49 (BP Location: Left Arm)   Pulse 70   Temp 99.3 F (37.4 C) (Oral)   Resp (!) 26 Comment: Will re assess again  Ht 5' 5.98" (1.676 m)   Wt 78.2 kg   SpO2 98%   BMI 27.84 kg/m   Intake/Output Summary (Last 24 hours) at 11/23/2017 0949 Last data filed at 11/23/2017 0700 Gross per 24 hour  Intake 2123.66 ml  Output 2500 ml  Net -376.34 ml   Weight change:   Physical Exam: General appearance: alert and appears stated age Head: Normocephalic, without obvious abnormality,  atraumatic Resp: poor air movement but no rales Cardio: regular rate and rhythm, S1, S2 normal, no murmur, click, rub or gallop GI: active BS, mild TTP but no rebound Extremities: edema none Pulses: 2+ and symmetric  Imaging: Dg Chest Port 1 View  Result Date: 11/22/2017 CLINICAL DATA:  Abnormal white blood cell count EXAM: PORTABLE CHEST 1 VIEW COMPARISON:  03/04/2016; chest CT-04/26/2017 FINDINGS: Grossly unchanged cardiac silhouette and mediastinal contours given reduced lung volumes and AP projection. Pulmonary vasculature appears less distinct than present examination with cephalization flow. Worsening bibasilar heterogeneous opacities, left greater than right. A trace left-sided pleural effusion is not excluded. No pneumothorax. No definite acute osseus abnormalities. Degenerative change of the bilateral glenohumeral joints is suspected though incompletely evaluated. IMPRESSION: Findings suggestive of pulmonary edema, bibasilar atelectasis and suspected trace left-sided effusion on this hypoventilated AP portable examination. Further evaluation with a PA and lateral chest radiograph may be obtained as clinically indicated. Electronically Signed   By: Sandi Mariscal M.D.   On: 11/22/2017 08:01   Dg Abd Portable 1v  Result Date: 11/22/2017 CLINICAL DATA:  Nasogastric tube placement. EXAM: PORTABLE ABDOMEN - 1 VIEW COMPARISON:  Radiograph of same day. FINDINGS: Nasogastric tube tip is seen in expected position of distal stomach or proximal duodenum. Stable small bowel dilatation is noted concerning for obstruction. IMPRESSION: Nasogastric tube tip seen in expected position of distal stomach or proximal duodenum. Stable small bowel dilatation is noted concerning for obstruction. Electronically Signed  By: Marijo Conception, M.D.   On: 11/22/2017 19:25   Dg Abd Portable 1v  Result Date: 11/22/2017 CLINICAL DATA:  Abdominal distention EXAM: PORTABLE ABDOMEN - 1 VIEW COMPARISON:  11/14/2017; CT  abdomen pelvis-11/15/2017 FINDINGS: There is persistent moderate to marked gaseous distention multiple loops of small bowel with index loop within mid aspect of the abdomen measuring approximately 4.8 cm in diameter. This finding is again associated with a conspicuous paucity of distal colonic gas. Moderate stool burden is seen within the rectal vault. Skin staples overlie the left groin. No definitive abnormal intra-abdominal calcifications. Degenerative change of the lower lumbar spine is suspected though incompletely evaluated. IMPRESSION: Similar findings worrisome for persistent small bowel obstruction. Continued attention on follow-up is advised. Electronically Signed   By: Sandi Mariscal M.D.   On: 11/22/2017 07:59   [place] Ekg Site Rite  Result Date: 11/22/2017 If Site Rite image not attached, placement could not be confirmed due to current cardiac rhythm.   Labs: BMET Recent Labs  Lab 11/17/17 0237 11/18/17 0355 11/20/17 0400 11/21/17 0321 11/22/17 0336 11/22/17 1958 11/23/17 0337  NA 147* 152* 155* 156* 155* 152* 154*  K 4.2 4.4 3.8 3.9 3.9 4.1 3.9  CL 113* 122* 119* 127* 126* 124* 124*  CO2 28 24 24 25 24 24 23   GLUCOSE 143* 173* 179* 330* 272* 289* 158*  BUN 52* 50* 48* 45* 49* 48* 47*  CREATININE 1.68* 1.67* 1.48* 1.74* 2.02* 1.96* 1.70*  CALCIUM 8.3* 8.3* 8.2* 7.5* 7.6* 7.5* 7.9*  PHOS  --   --   --   --   --   --  2.9   CBC Recent Labs  Lab 11/18/17 0355 11/21/17 0321 11/22/17 0336 11/23/17 0337  WBC 16.0* 17.7* 19.3* 16.2*  NEUTROABS  --  12.1* 16.6* 10.5*  HGB 10.0* 8.1* 8.3* 7.9*  HCT 33.2* 28.1* 29.3* 28.0*  MCV 87.6 89.5 90.4 92.1  PLT 230 298 351 367    Medications:    . acetaminophen  650 mg Oral Q6H  . bisacodyl  10 mg Rectal Once  . ibrutinib  280 mg Oral Daily  . insulin aspart  0-20 Units Subcutaneous Q4H  . mouth rinse  15 mL Mouth Rinse BID  . metoCLOPramide (REGLAN) injection  10 mg Intravenous Once  . tamsulosin  0.4 mg Oral QPC supper       Otelia Santee, MD 11/23/2017, 9:49 AM

## 2017-11-23 NOTE — Progress Notes (Signed)
Pt NGT accidentally came out when getting out of bed. Pt denies nausea and states that he is passing flatus today. Dr. Ninfa Linden notified.

## 2017-11-23 NOTE — Progress Notes (Signed)
Occupational Therapy Treatment Patient Details Name: Michael [name] MRN: [id number] DOB: [birth year] Today's Date: 11/23/2017    History of present illness Pt is a 84 y.o. M with significant PMH of CLL who presents s/p left inguinal hernia repair with mesh and small bowel resection   OT comments  Pt tolerated eob to chair transfer this session. Pt with noticeable "hiss" sound on arrival and Rn in room notified of this sound related to NG tube. Pt sitting eob with sound continued and sound stopped for a moment then pt states "it is tickling my nose" with NG tube d/c with gravity. RN addressing NG tube. Pt in good spirits and smiling at the end of session.    Follow Up Recommendations  Supervision/Assistance - 24 hour;CIR    Equipment Recommendations  Other (comment)    Recommendations for Other Services      Precautions / Restrictions Precautions Precautions: Fall Precaution Comments: NG tube (heard sucking sound on arrival and the NG tube was no longer in the nose)       Mobility Bed Mobility Overal bed mobility: Needs Assistance Bed Mobility: Supine to Sit     Supine to sit: Mod assist     General bed mobility comments: pt requires (A) to elevate from bed surface. pt motivated by music.   Transfers Overall transfer level: Needs assistance   Transfers: Sit to/from Stand Sit to Stand: Max assist;From elevated surface Stand pivot transfers: Max assist;From elevated surface       General transfer comment: pt requires (A) to pivot to chair max (A) with posterior lean. OT blocking L LE during transfer for safety     Balance Overall balance assessment: Needs assistance   Sitting balance-Leahy Scale: Poor Sitting balance - Comments: posterior lean-- min to min guard (A) Postural control: Posterior lean   Standing balance-Leahy Scale: Poor                             ADL either performed or assessed with clinical judgement   ADL Overall ADL's : Needs  assistance/impaired Eating/Feeding: NPO Eating/Feeding Details (indicate cue type and reason): using moisten swab to wipe mouth with bioteen - supervision levle         Lower Body Bathing: Total assistance       Lower Body Dressing: Total assistance   Toilet Transfer: Maximal assistance Toilet Transfer Details (indicate cue type and reason): pt squat pivot to chair to simulate. pt with posterior lean , pt unable to control descend           General ADL Comments: pt on elevated surface with pillows in chair pt requires (A) to power up and sustain static standing. reocmmend use of stedy for staff +2 for transfer back to bed     Vision       Perception     Praxis      Cognition Arousal/Alertness: Awake/alert Behavior During Therapy: [facility] for tasks assessed/performed Overall Cognitive Status: Impaired/Different from baseline Area of Impairment: Orientation;Awareness                     Memory: Decreased short-term memory Following Commands: Follows one step commands with increased time Safety/Judgement: Decreased awareness of deficits     General Comments: pt joking and smiling at the end of session. daughter states "i havent seen him this good in days"        Exercises General Exercises - Lower Extremity Ankle Circles/Pumps:  AROM;Both;Other reps (comment);Seated;Supine(30) Other Exercises Other Exercises: pt completed single leg raises and ankle pumps supine and then at the edge of bed. pt with music provided to help with ankle pumps to the beat of the music   Shoulder Instructions       General Comments increase risk for skin integrity due to decr mobility. recommend frequent position changes    Pertinent Vitals/ Pain       Pain Assessment: Faces Faces Pain Scale: Hurts little more Pain Location: generalized Pain Descriptors / Indicators: Grimacing Pain Intervention(s): Premedicated before session;Repositioned  Home Living                                           Prior Functioning/Environment              Frequency  Min 2X/week        Progress Toward Goals  OT Goals(current goals can now be found in the care plan section)  Progress towards OT goals: Progressing toward goals  Acute Rehab OT Goals Patient Stated Goal: to get something to drink OT Goal Formulation: With patient/family Time For Goal Achievement: 12/02/17 Potential to Achieve Goals: Good ADL Goals Pt Will Perform Grooming: with modified independence;sitting;standing Pt Will Perform Lower Body Bathing: with modified independence;sit to/from stand Pt Will Perform Lower Body Dressing: with modified independence;sit to/from stand Pt Will Transfer to Toilet: with modified independence;ambulating Pt Will Perform Toileting - Clothing Manipulation and hygiene: with modified independence;sit to/from stand  Plan Discharge plan remains appropriate    Co-evaluation                 AM-PAC PT "6 Clicks" Daily Activity     Outcome Measure   Help from another person eating meals?: None Help from another person taking care of personal grooming?: A Little Help from another person toileting, which includes using toliet, bedpan, or urinal?: A Lot Help from another person bathing (including washing, rinsing, drying)?: A Lot Help from another person to put on and taking off regular upper body clothing?: A Little Help from another person to put on and taking off regular lower body clothing?: A Lot 6 Click Score: 16    End of Session    OT Visit Diagnosis: Unsteadiness on feet (R26.81);Pain;Muscle weakness (generalized) (M62.81);Other symptoms and signs involving cognitive function   Activity Tolerance Patient tolerated treatment well   Patient Left in chair;with call bell/phone within reach;with family/visitor present   Nurse Communication Mobility status;Precautions        Time: [id number] OT Time Calculation (min): 30  min  Charges: OT General Charges $OT Visit: 1 Visit OT Treatments $Self Care/Home Management : 8-22 mins $Therapeutic Activity: 8-22 mins   Jeri Modena, OTR/L  Acute Rehabilitation Services Pager: [phone] Office: [phone] .'    Parke Poisson B 11/23/2017, 4:05 PM

## 2017-11-23 NOTE — Plan of Care (Signed)
  Problem: Pain Managment: Goal: General experience of comfort will improve Outcome: Progressing   

## 2017-11-23 NOTE — Progress Notes (Signed)
8 Days Post-Op   Subjective/Chief Complaint: Still with some abdominal discomfort Had BM with suppos   Objective: Vital signs in last 24 hours: Temp:  [98.2 F (36.8 C)-99.1 F (37.3 C)] 98.2 F (36.8 C) (10/28 1959) Pulse Rate:  [81-83] 81 (10/28 1959) Resp:  [18-22] 18 (10/28 1959) BP: (98-99)/(49-50) 99/50 (10/28 1959) SpO2:  [98 %-99 %] 99 % (10/28 1959) Weight:  [78.2 kg] 78.2 kg (10/28 1335) Last BM Date: 11/20/17  Intake/Output from previous day: 10/28 0701 - 10/29 0700 In: 2243.7 [P.O.:160; I.V.:1830.5; IV Piggyback:253.2] Out: 2500 [Urine:400; Emesis/NG output:2100] Intake/Output this shift: No intake/output data recorded.  Exam: Abdomen still mildly distended, +BS, mild diffuse tenderness Lungs clear CV RRR  Lab Results:  Recent Labs    11/22/17 0336 11/23/17 0337  WBC 19.3* 16.2*  HGB 8.3* 7.9*  HCT 29.3* 28.0*  PLT 351 367   BMET Recent Labs    11/22/17 1958 11/23/17 0337  NA 152* 154*  K 4.1 3.9  CL 124* 124*  CO2 24 23  GLUCOSE 289* 158*  BUN 48* 47*  CREATININE 1.96* 1.70*  CALCIUM 7.5* 7.9*   PT/INR No results for input(s): LABPROT, INR in the last 72 hours. ABG No results for input(s): PHART, HCO3 in the last 72 hours.  Invalid input(s): PCO2, PO2  Studies/Results: Dg Chest Port 1 View  Result Date: 11/22/2017 CLINICAL DATA:  Abnormal white blood cell count EXAM: PORTABLE CHEST 1 VIEW COMPARISON:  03/04/2016; chest CT-04/26/2017 FINDINGS: Grossly unchanged cardiac silhouette and mediastinal contours given reduced lung volumes and AP projection. Pulmonary vasculature appears less distinct than present examination with cephalization flow. Worsening bibasilar heterogeneous opacities, left greater than right. A trace left-sided pleural effusion is not excluded. No pneumothorax. No definite acute osseus abnormalities. Degenerative change of the bilateral glenohumeral joints is suspected though incompletely evaluated. IMPRESSION: Findings  suggestive of pulmonary edema, bibasilar atelectasis and suspected trace left-sided effusion on this hypoventilated AP portable examination. Further evaluation with a PA and lateral chest radiograph may be obtained as clinically indicated. Electronically Signed   By: Sandi Mariscal M.D.   On: 11/22/2017 08:01   Dg Abd Portable 1v  Result Date: 11/22/2017 CLINICAL DATA:  Nasogastric tube placement. EXAM: PORTABLE ABDOMEN - 1 VIEW COMPARISON:  Radiograph of same day. FINDINGS: Nasogastric tube tip is seen in expected position of distal stomach or proximal duodenum. Stable small bowel dilatation is noted concerning for obstruction. IMPRESSION: Nasogastric tube tip seen in expected position of distal stomach or proximal duodenum. Stable small bowel dilatation is noted concerning for obstruction. Electronically Signed   By: Marijo Conception, M.D.   On: 11/22/2017 19:25   Dg Abd Portable 1v  Result Date: 11/22/2017 CLINICAL DATA:  Abdominal distention EXAM: PORTABLE ABDOMEN - 1 VIEW COMPARISON:  11/14/2017; CT abdomen pelvis-11/15/2017 FINDINGS: There is persistent moderate to marked gaseous distention multiple loops of small bowel with index loop within mid aspect of the abdomen measuring approximately 4.8 cm in diameter. This finding is again associated with a conspicuous paucity of distal colonic gas. Moderate stool burden is seen within the rectal vault. Skin staples overlie the left groin. No definitive abnormal intra-abdominal calcifications. Degenerative change of the lower lumbar spine is suspected though incompletely evaluated. IMPRESSION: Similar findings worrisome for persistent small bowel obstruction. Continued attention on follow-up is advised. Electronically Signed   By: Sandi Mariscal M.D.   On: 11/22/2017 07:59   [place] Ekg Site Rite  Result Date: 11/22/2017 If [employer] not attached,  placement could not be confirmed due to current cardiac rhythm.   Anti-infectives: Anti-infectives (From  admission, onward)   Start     Dose/Rate Route Frequency Ordered Stop   11/15/17 1400  piperacillin-tazobactam (ZOSYN) IVPB 3.375 g     3.375 g 12.5 mL/hr over 240 Minutes Intravenous Every 8 hours 11/15/17 0718     11/15/17 0800  piperacillin-tazobactam (ZOSYN) IVPB 3.375 g     3.375 g 100 mL/hr over 30 Minutes Intravenous  Once 11/15/17 0718 11/15/17 0923   11/10/17 1210  ceFAZolin (ANCEF) 2-4 GM/100ML-% IVPB    Note to Pharmacy:  [name], [place]   : cabinet override      11/10/17 1210 11/10/17 1451   11/10/17 1200  ceFAZolin (ANCEF) IVPB 2g/100 mL premix     2 g 200 mL/hr over 30 Minutes Intravenous On call to O.R. 11/10/17 1158 11/10/17 1501      Assessment/Plan: s/p Procedure(s): LEFT INGUINAL HERNIA REPAIR WITH MESH (Left) GROIN EXPLORATION WITH REMOVAL OF INGUINAL HERNIA MESH (Left) SMALL BOWEL RESECTION (N/A)   Appreciate Nephrology's input and help.   Creatinine Improved Ileus- continue NG Malnutrition-start TNA Continue IV ABx - WBC decreased Repeat suppos Anemia of chronic disease  LOS: 9 days    Michael [name] A 11/23/2017

## 2017-11-23 NOTE — Progress Notes (Signed)
[facility] CONSULT NOTE   Pharmacy Consult for TPN Indication: Prolonged Ileus  Patient Measurements: Height: 5' 5.98" (167.6 cm) Weight: 172 lb 6.4 oz (78.2 kg) IBW/kg (Calculated) : 63.76 TPN AdjBW (KG): 69.9 Body mass index is 27.84 kg/m.  Assessment:  84 yo M presents on 10/15 with a L inguinal hernia. Surgery took to OR to repair on 10/16. Unfortunately he developed a hematoma at the operative site. Was then to be discharged on 10/18 but his wound started draining seroma fluid so kept inpatient. Then developed N/V and found to have an ileus. Has been getting conservative management but now has been over a week out from initial surgery and not tolerating CLD. Pharmacy consulted to start TPN.  GI: TPN started for prolonged ileus / poor PO intake. Prealbumin is low at 7.4. Per surgery, had BM with suppository but none charted. 2.1 NG output yesterday.  Endo: Hx of DMs. CBGs improved on rSSI (131-230) Insulin requirements in the past 24 hours: 46 units SSI + 25u insulin in TPN Lytes: Na 154, K 3.9, Mg 2.8, Phos 2.9, corrCa 9.4  Renal: SCr down to 1.7, BUN 47. UOP down to 0.2 ml/kg/hr off lasix. Net negative this admit. Pulm: 2L of [place] Cards: BP soft and HR ok Hepatobil: LFTs wnl, TG 132 Neuro: ID: On Zosyn. Afebrile, WBC down to 16.2.  TPN Access: PICC 10/28>> TPN start date: 10/28  Nutritional Goals (per RD recommendation on 10/28): KCal: 1801 kcal/day Protein: 89-106 g  Fluid: >2 L  Goal TPN rate is 85 ml/hr  Current Nutrition:  CLD  Plan:  Increase TPN to 33mL/hr This TPN provides 72 g of protein, 172 g of dextrose, and 43 g of lipids which provides 1260 kCals per day, meeting ~70% of patient needs Electrolytes in TPN: No Na. Cl:Ac, max acetate Add MVI, trace elements, 25 units of regular insulin, and famotidine 20mg  to TPN Continue rSSI Continue D5% at 125 mL/hr per MD to address free water deficit. CBGs should improved as we back off  D5% rate  Monitor TPN labs F/U improvement in PO intake  Albertina Parr, PharmD., BCPS Clinical Pharmacist Clinical phone for 11/23/17 until 3:30pm: [phone] If after 3:30pm, please refer to [facility] for unit-specific pharmacist

## 2017-11-24 ENCOUNTER — Telehealth: Payer: Self-pay

## 2017-11-24 LAB — BASIC METABOLIC PANEL
Anion gap: 6 (ref 5–15)
BUN: 38 mg/dL — ABNORMAL HIGH (ref 8–23)
CALCIUM: 8 mg/dL — AB (ref 8.9–10.3)
CO2: 22 mmol/L (ref 22–32)
CREATININE: 1.31 mg/dL — AB (ref 0.61–1.24)
Chloride: 119 mmol/L — ABNORMAL HIGH (ref 98–111)
GFR, EST AFRICAN AMERICAN: 56 mL/min — AB (ref >60)
GFR, EST NON AFRICAN AMERICAN: 48 mL/min — AB (ref >60)
Glucose, Bld: 270 mg/dL — ABNORMAL HIGH (ref 70–99)
Potassium: 4 mmol/L (ref 3.5–5.1)
SODIUM: 147 mmol/L — AB (ref 135–145)

## 2017-11-24 LAB — GLUCOSE, CAPILLARY
GLUCOSE-CAPILLARY: 192 mg/dL — AB (ref 70–99)
GLUCOSE-CAPILLARY: 213 mg/dL — AB (ref 70–99)
GLUCOSE-CAPILLARY: 213 mg/dL — AB (ref 70–99)
GLUCOSE-CAPILLARY: 230 mg/dL — AB (ref 70–99)
Glucose-Capillary: 241 mg/dL — ABNORMAL HIGH (ref 70–99)
Glucose-Capillary: 261 mg/dL — ABNORMAL HIGH (ref 70–99)
Glucose-Capillary: 230 mg/dL — ABNORMAL HIGH (ref 70–99)

## 2017-11-24 LAB — MAGNESIUM: MAGNESIUM: 2.9 mg/dL — AB (ref 1.7–2.4)

## 2017-11-24 LAB — PHOSPHORUS: PHOSPHORUS: 2.9 mg/dL (ref 2.5–4.6)

## 2017-11-24 MED ORDER — METOCLOPRAMIDE HCL 5 MG/ML IJ SOLN
10.0000 mg | Freq: Once | INTRAMUSCULAR | Status: AC
  Administered 2017-11-24: 10 mg via INTRAVENOUS
  Filled 2017-11-24: qty 2

## 2017-11-24 MED ORDER — TRAVASOL 10 % IV SOLN
INTRAVENOUS | Status: AC
  Administered 2017-11-24: 17:00:00 via INTRAVENOUS
  Filled 2017-11-24: qty 1020

## 2017-11-24 MED FILL — IMBRUVICA 140 MG CAPSULE: 140 | 30 days supply | Qty: 60 | Fill #1

## 2017-11-24 NOTE — Progress Notes (Signed)
9 Days Post-Op   Subjective/Chief Complaint: Feeling better today Passing flatus but still moderate NG output   Objective: Vital signs in last 24 hours: Temp:  [97.8 F (36.6 C)-99.3 F (37.4 C)] 98.7 F (37.1 C) (10/30 0459) Pulse Rate:  [65-88] 66 (10/30 0459) Resp:  [16-26] 20 (10/30 0459) BP: (101-112)/(47-92) 106/54 (10/30 0459) SpO2:  [97 %-100 %] 97 % (10/30 0459) Last BM Date: 11/23/17  Intake/Output from previous day: 10/29 0701 - 10/30 0700 In: 4229.2 [I.V.:3979; IV Piggyback:250.2] Out: 2625 [Urine:1025; Emesis/NG output:1600] Intake/Output this shift: No intake/output data recorded.  Exam: Much more alert today Lungs clear Abdomen softer, less distended, groin incision clean  Lab Results:  Recent Labs    11/22/17 0336 11/23/17 0337  WBC 19.3* 16.2*  HGB 8.3* 7.9*  HCT 29.3* 28.0*  PLT 351 367   BMET Recent Labs    11/23/17 1859 11/24/17 0415  NA 149* 147*  K 3.8 4.0  CL 121* 119*  CO2 22 22  GLUCOSE 336* 270*  BUN 44* 38*  CREATININE 1.57* 1.31*  CALCIUM 7.8* 8.0*   PT/INR No results for input(s): LABPROT, INR in the last 72 hours. ABG No results for input(s): PHART, HCO3 in the last 72 hours.  Invalid input(s): PCO2, PO2  Studies/Results: Dg Chest Port 1 View  Result Date: 11/22/2017 CLINICAL DATA:  Abnormal white blood cell count EXAM: PORTABLE CHEST 1 VIEW COMPARISON:  03/04/2016; chest CT-04/26/2017 FINDINGS: Grossly unchanged cardiac silhouette and mediastinal contours given reduced lung volumes and AP projection. Pulmonary vasculature appears less distinct than present examination with cephalization flow. Worsening bibasilar heterogeneous opacities, left greater than right. A trace left-sided pleural effusion is not excluded. No pneumothorax. No definite acute osseus abnormalities. Degenerative change of the bilateral glenohumeral joints is suspected though incompletely evaluated. IMPRESSION: Findings suggestive of pulmonary edema,  bibasilar atelectasis and suspected trace left-sided effusion on this hypoventilated AP portable examination. Further evaluation with a PA and lateral chest radiograph may be obtained as clinically indicated. Electronically Signed   By: Sandi Mariscal M.D.   On: 11/22/2017 08:01   Dg Abd Portable 1v  Result Date: 11/23/2017 CLINICAL DATA:  Nasogastric tube placement. EXAM: PORTABLE ABDOMEN - 1 VIEW COMPARISON:  Abdominal radiograph November 14, 2017 FINDINGS: Nasogastric tube looped in proximal stomach, side port projecting distal to GE junction. Multiple loops of gas distended small bowel measuring to at least 4 cm. Air within large bowel without dilatation. No intra-abdominal mass effect. Soft tissue planes and included osseous structures are unchanged. IMPRESSION: Nasogastric tube tip projects in proximal stomach. Mild ileus versus early small bowel obstruction. Electronically Signed   By: Elon Alas M.D.   On: 11/23/2017 21:42   Dg Abd Portable 1v  Result Date: 11/22/2017 CLINICAL DATA:  Nasogastric tube placement. EXAM: PORTABLE ABDOMEN - 1 VIEW COMPARISON:  Radiograph of same day. FINDINGS: Nasogastric tube tip is seen in expected position of distal stomach or proximal duodenum. Stable small bowel dilatation is noted concerning for obstruction. IMPRESSION: Nasogastric tube tip seen in expected position of distal stomach or proximal duodenum. Stable small bowel dilatation is noted concerning for obstruction. Electronically Signed   By: Marijo Conception, M.D.   On: 11/22/2017 19:25   Dg Abd Portable 1v  Result Date: 11/22/2017 CLINICAL DATA:  Abdominal distention EXAM: PORTABLE ABDOMEN - 1 VIEW COMPARISON:  11/14/2017; CT abdomen pelvis-11/15/2017 FINDINGS: There is persistent moderate to marked gaseous distention multiple loops of small bowel with index loop within mid aspect of the  abdomen measuring approximately 4.8 cm in diameter. This finding is again associated with a conspicuous paucity  of distal colonic gas. Moderate stool burden is seen within the rectal vault. Skin staples overlie the left groin. No definitive abnormal intra-abdominal calcifications. Degenerative change of the lower lumbar spine is suspected though incompletely evaluated. IMPRESSION: Similar findings worrisome for persistent small bowel obstruction. Continued attention on follow-up is advised. Electronically Signed   By: Sandi Mariscal M.D.   On: 11/22/2017 07:59    Anti-infectives: Anti-infectives (From admission, onward)   Start     Dose/Rate Route Frequency Ordered Stop   11/15/17 1400  piperacillin-tazobactam (ZOSYN) IVPB 3.375 g     3.375 g 12.5 mL/hr over 240 Minutes Intravenous Every 8 hours 11/15/17 0718     11/15/17 0800  piperacillin-tazobactam (ZOSYN) IVPB 3.375 g     3.375 g 100 mL/hr over 30 Minutes Intravenous  Once 11/15/17 0718 11/15/17 0923   11/10/17 1210  ceFAZolin (ANCEF) 2-4 GM/100ML-% IVPB    Note to Pharmacy:  [name], [place]   : cabinet override      11/10/17 1210 11/10/17 1451   11/10/17 1200  ceFAZolin (ANCEF) IVPB 2g/100 mL premix     2 g 200 mL/hr over 30 Minutes Intravenous On call to O.R. 11/10/17 1158 11/10/17 1501      Assessment/Plan: s/p Procedure(s): LEFT INGUINAL HERNIA REPAIR WITH MESH (Left) GROIN EXPLORATION WITH REMOVAL OF INGUINAL HERNIA MESH (Left) SMALL BOWEL RESECTION (N/A)  Again, appreciate nephrology's care.  Pt's renal function, sodium improving Will repeat CBC tomorrow as well as KUB,  Continue NG for now and TNA for malnutrition PT   LOS: 10 days    Denielle Bayard A 11/24/2017

## 2017-11-24 NOTE — Telephone Encounter (Signed)
Oral Oncology Patient Advocate Encounter  [facility] has not been able to contact the patient to refill his Imbruvica. I called the numbers listed for the patient and could not reach him. I called his daughter, [name] and she stated he has had surgery and is in the hospital not doing well. Annetta stated that the hospital has his medicine and he should be taking it there, she asked that will fill the Imbruivca and have it shipped out today 11/24/17. I put it in Sandip Power at the pharmacy and let [facility] know what was going on.  [facility] Phone [phone] Fax [phone]

## 2017-11-24 NOTE — Progress Notes (Signed)
[facility] CONSULT NOTE   Pharmacy Consult for TPN Indication: Prolonged Ileus  Patient Measurements: Height: 5' 5.98" (167.6 cm) Weight: 172 lb 6.4 oz (78.2 kg) IBW/kg (Calculated) : 63.76 TPN AdjBW (KG): 69.9 Body mass index is 27.84 kg/m.  Assessment:  84 yo M presents on 10/15 with a L inguinal hernia. Surgery took to OR to repair on 10/16. Unfortunately he developed a hematoma at the operative site. Was then to be discharged on 10/18 but his wound started draining seroma fluid so kept inpatient. Then developed N/V and found to have an ileus. Has been getting conservative management but now has been over a week out from initial surgery and not tolerating CLD. Pharmacy consulted to start TPN.  GI: TPN started for prolonged ileus / poor PO intake. Prealbumin is low at 7.4. Per surgery, had BM with suppository but none charted. 2.1 NG output yesterday.  Endo: Hx of DMs. CBGs remain high on rSSI + 25 units in TPN  Insulin requirements in the past 24 hours: 59 units SSI + 25u insulin in TPN Lytes: Na 154>147, K 4, Mg 2.0, Phos 2.9, corrCa 9.4  Renal: SCr down to 1.3, BUN 38. UOP down to 0.5 ml/kg/hr off lasix. Net negative this admit. Pulm: 2L of [place] Cards: BP soft and HR ok Hepatobil: LFTs wnl, TG 132 Neuro: ID: On Zosyn. Afebrile, WBC down to 16.2.  TPN Access: PICC 10/28>> TPN start date: 10/28  Nutritional Goals (per RD recommendation on 10/28): KCal: 1801 kcal/day Protein: 89-106 g  Fluid: >2 L  Goal TPN rate is 85 ml/hr  Current Nutrition:  CLD  Plan:  Increase TPN to 46mL/hr This TPN provides 102 g of protein, 245 g of dextrose, and 61 g of lipids which provides 1790 kCals per day, meeting ~99% of patient needs Electrolytes in TPN: No Na, Cl:Ac, max acetate Add MVI, trace elements, increase to 35 units of regular insulin in TPN, and famotidine 20mg  to TPN Continue rSSI D5% decreased to 100 mL/hr per MD to address free water deficit.  Will need to monitor insulin in bag closely as we back off D5% rate  Monitor TPN labs F/U improvement in PO intake  Albertina Parr, PharmD., BCPS Clinical Pharmacist Clinical phone for 11/24/17 until 3:30pm: [phone] If after 3:30pm, please refer to [facility] for unit-specific pharmacist

## 2017-11-24 NOTE — Progress Notes (Signed)
[facility] Progress Note    Assessment/ Plan:   84 y.o.malewith open repair of an incarcerated left inguinal hernia repair 10/16 with Prolene mesh w/ CT showing recurrent hernia w/ small bowel -> 11/15/2017 revision with partial resection of small bowel for ischemia. Being seen for hypernatremia and AKI.   1. Renal failure - baseline creatinine is 1 and now with worsening and fluctuating renal function over the past 1-1/2 weeks. Microscopy suggests ATN which would be consistent with possible infection, volume down (based on history and I&O during the current hospitalization) + recent relative hypotension. - UOP very good and SNa trending down but glucose levels high.    2. Hypernatremia - free water deficit of 4.7L, unclear what ongoing free water losses are but appears to be mainly from the NG based on volumes.  - Will decrease D5W to 156ml/hr and monitor; the SNa will be stable once he's able to take PO free water consistently. Of note Emesis amount 2100/1600 past 48hrs and we will need to watch this as this is where the free water loss is coming from.  3. Hernia s/p repair 4. Wound infection? - on zosyn 5. CLL 6. DM 7. Anemia  Subjective:   Feeling a lot better today with less discomfort in the abdomen and good pain control   Denies f/c/n/v/dyspnea   Objective:   BP (!) 106/54 (BP Location: Left Arm)   Pulse 66   Temp 98.7 F (37.1 C) (Oral)   Resp 20   Ht 5' 5.98" (1.676 m)   Wt 78.2 kg   SpO2 97%   BMI 27.84 kg/m   Intake/Output Summary (Last 24 hours) at 11/24/2017 0901 Last data filed at 11/24/2017 1941 Gross per 24 hour  Intake 4229.24 ml  Output 2975 ml  Net 1254.24 ml   Weight change:   Physical Exam: General appearance:alert and appears stated age Head:Normocephalic, without obvious abnormality, atraumatic Resp:poor air movement but no rales Cardio:regular rate and rhythm, S1, S2 normal, no murmur, click, rub or  gallop DE:YCXKGY BS, mild TTP but no rebound, definitely improved Extremities:[place] Pulses:2+ and symmetric  Imaging: Dg Abd Portable 1v  Result Date: 11/23/2017 CLINICAL DATA:  Nasogastric tube placement. EXAM: PORTABLE ABDOMEN - 1 VIEW COMPARISON:  Abdominal radiograph November 14, 2017 FINDINGS: Nasogastric tube looped in proximal stomach, side port projecting distal to GE junction. Multiple loops of gas distended small bowel measuring to at least 4 cm. Air within large bowel without dilatation. No intra-abdominal mass effect. Soft tissue planes and included osseous structures are unchanged. IMPRESSION: Nasogastric tube tip projects in proximal stomach. Mild ileus versus early small bowel obstruction. Electronically Signed   By: Elon Alas M.D.   On: 11/23/2017 21:42   Dg Abd Portable 1v  Result Date: 11/22/2017 CLINICAL DATA:  Nasogastric tube placement. EXAM: PORTABLE ABDOMEN - 1 VIEW COMPARISON:  Radiograph of same day. FINDINGS: Nasogastric tube tip is seen in expected position of distal stomach or proximal duodenum. Stable small bowel dilatation is noted concerning for obstruction. IMPRESSION: Nasogastric tube tip seen in expected position of distal stomach or proximal duodenum. Stable small bowel dilatation is noted concerning for obstruction. Electronically Signed   By: Marijo Conception, M.D.   On: 11/22/2017 19:25    Labs: BMET Recent Labs  Lab 11/20/17 0400 11/21/17 0321 11/22/17 0336 11/22/17 1958 11/23/17 0337 11/23/17 1859 11/24/17 0415  NA 155* 156* 155* 152* 154* 149* 147*  K 3.8 3.9 3.9 4.1 3.9 3.8 4.0  CL 119*  127* 126* 124* 124* 121* 119*  CO2 24 25 24 24 23 22 22   GLUCOSE 179* 330* 272* 289* 158* 336* 270*  BUN 48* 45* 49* 48* 47* 44* 38*  CREATININE 1.48* 1.74* 2.02* 1.96* 1.70* 1.57* 1.31*  CALCIUM 8.2* 7.5* 7.6* 7.5* 7.9* 7.8* 8.0*  PHOS  --   --   --   --  2.9  --  2.9   CBC Recent Labs  Lab 11/18/17 0355 11/21/17 0321 11/22/17 0336  11/23/17 0337  WBC 16.0* 17.7* 19.3* 16.2*  NEUTROABS  --  12.1* 16.6* 10.5*  HGB 10.0* 8.1* 8.3* 7.9*  HCT 33.2* 28.1* 29.3* 28.0*  MCV 87.6 89.5 90.4 92.1  PLT 230 298 351 367    Medications:    . acetaminophen  650 mg Oral Q6H  . ibrutinib  280 mg Oral Daily  . insulin aspart  0-20 Units Subcutaneous Q4H  . mouth rinse  15 mL Mouth Rinse BID  . tamsulosin  0.4 mg Oral QPC supper      Otelia Santee, MD 11/24/2017, 9:01 AM

## 2017-11-25 ENCOUNTER — Telehealth: Payer: Self-pay | Admitting: Hematology and Oncology

## 2017-11-25 ENCOUNTER — Inpatient Hospital Stay (HOSPITAL_COMMUNITY): Payer: Medicare HMO

## 2017-11-25 LAB — COMPREHENSIVE METABOLIC PANEL
ALBUMIN: 1.6 g/dL — AB (ref 3.5–5.0)
ALT: 28 U/L (ref 0–44)
AST: 26 U/L (ref 15–41)
Alkaline Phosphatase: 68 U/L (ref 38–126)
Anion gap: 4 — ABNORMAL LOW (ref 5–15)
BUN: 30 mg/dL — AB (ref 8–23)
CHLORIDE: 117 mmol/L — AB (ref 98–111)
CO2: 20 mmol/L — ABNORMAL LOW (ref 22–32)
Calcium: 7.9 mg/dL — ABNORMAL LOW (ref 8.9–10.3)
Creatinine, Ser: 1.13 mg/dL (ref 0.61–1.24)
GFR calc Af Amer: >60 mL/min (ref >60)
GFR calc non Af Amer: 57 mL/min — ABNORMAL LOW (ref >60)
GLUCOSE: 205 mg/dL — AB (ref 70–99)
POTASSIUM: 3.6 mmol/L (ref 3.5–5.1)
Sodium: 141 mmol/L (ref 135–145)
Total Bilirubin: 0.2 mg/dL — ABNORMAL LOW (ref 0.3–1.2)
Total Protein: 4.9 g/dL — ABNORMAL LOW (ref 6.5–8.1)

## 2017-11-25 LAB — MAGNESIUM: Magnesium: 2.6 mg/dL — ABNORMAL HIGH (ref 1.7–2.4)

## 2017-11-25 LAB — GLUCOSE, CAPILLARY
GLUCOSE-CAPILLARY: 179 mg/dL — AB (ref 70–99)
GLUCOSE-CAPILLARY: 200 mg/dL — AB (ref 70–99)
Glucose-Capillary: 129 mg/dL — ABNORMAL HIGH (ref 70–99)
Glucose-Capillary: 161 mg/dL — ABNORMAL HIGH (ref 70–99)
Glucose-Capillary: 197 mg/dL — ABNORMAL HIGH (ref 70–99)
Glucose-Capillary: 223 mg/dL — ABNORMAL HIGH (ref 70–99)

## 2017-11-25 LAB — CBC
HEMATOCRIT: 27.7 % — AB (ref 39.0–52.0)
Hemoglobin: 8 g/dL — ABNORMAL LOW (ref 13.0–17.0)
MCH: 26.1 pg (ref 26.0–34.0)
MCHC: 28.9 g/dL — AB (ref 30.0–36.0)
MCV: 90.5 fL (ref 80.0–100.0)
NRBC: 0 % (ref 0.0–0.2)
PLATELETS: 376 10*3/uL (ref 150–400)
RBC: 3.06 MIL/uL — AB (ref 4.22–5.81)
RDW: 16.6 % — AB (ref 11.5–15.5)
WBC: 11.4 10*3/uL — ABNORMAL HIGH (ref 4.0–10.5)

## 2017-11-25 LAB — PHOSPHORUS: Phosphorus: 2.9 mg/dL (ref 2.5–4.6)

## 2017-11-25 MED ORDER — METOCLOPRAMIDE HCL 5 MG/ML IJ SOLN
10.0000 mg | Freq: Once | INTRAMUSCULAR | Status: AC
  Administered 2017-11-25: 10 mg via INTRAVENOUS
  Filled 2017-11-25: qty 2

## 2017-11-25 MED ORDER — BOOST / RESOURCE BREEZE PO LIQD CUSTOM
1.0000 | Freq: Three times a day (TID) | ORAL | Status: DC
  Administered 2017-11-26 – 2017-11-29 (×8): 1 via ORAL

## 2017-11-25 MED ORDER — TRAVASOL 10 % IV SOLN
INTRAVENOUS | Status: AC
  Administered 2017-11-25: 18:00:00 via INTRAVENOUS
  Filled 2017-11-25: qty 1020

## 2017-11-25 NOTE — Social Work (Signed)
CSW continuing to follow- voice mail left for pt daughter Marlowe Kays to discuss facility preference and to start authorization as pt medically stabilizes.  Westley Hummer, MSW, [place] [facility] [phone]

## 2017-11-25 NOTE — Progress Notes (Signed)
Physical Therapy Treatment Patient Details Name: Michael [name] MRN: [id number] DOB: 28-Oct-1930 Today's Date: 11/25/2017    History of Present Illness Pt is a 84 y.o. M with significant PMH of CLL who presents s/p left inguinal hernia repair with mesh and small bowel resection    PT Comments    Patient seen for mobility progression. Pt is making progress toward PT goals and able to ambulate short distance this session. Pt requires mod A-mod A +2 for bed mobility and mod/max A +2 for safety with functional transfers and gait training.  Pt's youngest daughter present throughout session and pleasant. Pt reports feeling much better today and joking around throughout session. Pt will continue to benefit from further skilled PT services both acute and post acute to maximize independence and safety with mobility.    Follow Up Recommendations  SNF     Equipment Recommendations  Other (comment)(defer to next venue )    Recommendations for Other Services Rehab consult;OT consult     Precautions / Restrictions Precautions Precautions: Fall    Mobility  Bed Mobility Overal bed mobility: Needs Assistance Bed Mobility: Rolling;Sidelying to Sit Rolling: Mod assist Sidelying to sit: Mod assist;+2 for physical assistance       General bed mobility comments: assist to roll R and L for pericare and bed pad change; assist to bring bilat LE to EOB and to elevate trunk into sitting; use of rails  Transfers Overall transfer level: Needs assistance Equipment used: Rolling walker (2 wheeled) Transfers: Sit to/from Stand Sit to Stand: Mod assist;+2 safety/equipment         General transfer comment: assist to power up into standing and for balance upon standing; cues for safe hand placement and safe use of AD upon standing   Ambulation/Gait Ambulation/Gait assistance: Mod assist;Max assist;+2 safety/equipment(chair follow) Gait Distance (Feet): 30 Feet Assistive device: Rolling walker (2  wheeled) Gait Pattern/deviations: Step-through pattern;Decreased stride length;Trunk flexed Gait velocity: decreased   General Gait Details: assistance for balance and guiding RW; cues for posture and safe use of AD; distance limited to c/o dizziness; SpO2 94% on 2L O2 via [place] and HR in 80s   Stairs             Wheelchair Mobility    Modified Rankin (Stroke Patients Only)       Balance Overall balance assessment: Needs assistance Sitting-balance support: Feet supported;Bilateral upper extremity supported Sitting balance-Leahy Scale: Poor     Standing balance support: During functional activity;Bilateral upper extremity supported Standing balance-Leahy Scale: Poor                              Cognition Arousal/Alertness: Awake/alert Behavior During Therapy: WFL for tasks assessed/performed Overall Cognitive Status: Within Functional Limits for tasks assessed                                        Exercises      General Comments General comments (skin integrity, edema, etc.): pt's youngest daughter present during session and pleasant; pt has wound on buttocks and RN notified and came in to assess and placed sacral protective pad; geo mat cushion in chair       Pertinent Vitals/Pain Pain Assessment: Faces Faces Pain Scale: Hurts even more Pain Location: head/neck and abdomen Pain Descriptors / Indicators: Grimacing;Guarding;Moaning Pain Intervention(s): Limited activity within patient's tolerance;Monitored during  session;Repositioned    Home Living                      Prior Function            PT Goals (current goals can now be found in the care plan section) Progress towards PT goals: Progressing toward goals    Frequency    Min 2X/week      PT Plan Current plan remains appropriate    Co-evaluation              AM-PAC PT "6 Clicks" Daily Activity  Outcome Measure  Difficulty turning over in bed  (including adjusting bedclothes, sheets and blankets)?: Unable Difficulty moving from lying on back to sitting on the side of the bed? : Unable Difficulty sitting down on and standing up from a chair with arms (e.g., wheelchair, bedside commode, etc,.)?: Unable Help needed moving to and from a bed to chair (including a wheelchair)?: A Lot Help needed walking in hospital room?: A Lot Help needed climbing 3-5 steps with a railing? : Total 6 Click Score: 8    End of Session Equipment Utilized During Treatment: Oxygen;Gait belt Activity Tolerance: Patient tolerated treatment well Patient left: with call bell/phone within reach;in chair;with family/visitor present Nurse Communication: Mobility status PT Visit Diagnosis: Unsteadiness on feet (R26.81);Muscle weakness (generalized) (M62.81);Difficulty in walking, not elsewhere classified (R26.2);Pain Pain - part of body: (abdominal )     Time: 8270-7867 PT Time Calculation (min) (ACUTE ONLY): 45 min  Charges:  $Gait Training: 8-22 mins $Therapeutic Activity: 23-37 mins                     Earney Navy, PTA Acute Rehabilitation Services Pager: [phone] Office: [phone]     Darliss Cheney 11/25/2017, 4:49 PM

## 2017-11-25 NOTE — Progress Notes (Signed)
Nutrition Follow-up  DOCUMENTATION CODES:   Not applicable  INTERVENTION:   -TPN management per pharmacy -Boost Breeze po TID, each supplement provides 250 kcal and 9 grams of protein (to start on 11/26/17)  NUTRITION DIAGNOSIS:   Inadequate oral intake related to acute illness as evidenced by estimated needs, NPO status.  Ongoing; just progressed to CLD and remains on TPN  GOAL:   Patient will meet greater than or equal to 90% of their needs  Met with TPN  MONITOR:   Diet advancement, Labs, Weight trends  REASON FOR ASSESSMENT:   Consult New TPN/TNA  ASSESSMENT:   84 yo male, admitted s/p L inguinal hernia repair. PMH significant for prolonged ileus, CHF, DM, GERD, hemorrhoids, HTN, prostate cancer, seizure disorder.   10/21- s/p Procedure(s): LEFT INGUINAL HERNIA REPAIR WITH MESH (Left) GROIN EXPLORATION WITH REMOVAL OF INGUINAL HERNIA MESH (Left) SMALL BOWEL RESECTION (N/A) 10/28- PICC placed, TPN initiated 10/31- NGT d/c, advanced to clear liquid diet  Pt receiving nursing care at time of visit.   Pt was just advanced to clear liquid diet; no intake data available to assess at this time.   Pt remains on TPN; infusing at 85 ml/hr, which provides 1852 kcals and 102 grams protein, meeting 100% of estimated kcal and protein needs.   Labs reviewed: K and Phos WDL, Mg: 2.6 (in TPN), CBGS: 179-213 (inpatient orders for glycemic control are 0-20 units insulun aspart every 4 hours).   Diet Order:   Diet Order            Diet clear liquid Room service appropriate? Yes; Fluid consistency: Thin  Diet effective now              EDUCATION NEEDS:   No education needs have been identified at this time  Skin:  Skin Assessment: Skin Integrity Issues: Skin Integrity Issues:: DTI, Incisions DTI: buttocks Incisions: lt groin  Last BM:  11/23/17  Height:   Ht Readings from Last 1 Encounters:  11/15/17 5' 5.98" (1.676 m)    Weight:   Wt Readings from Last 1  Encounters:  11/22/17 78.2 kg    Ideal Body Weight:  64.6 kg  BMI:  Body mass index is 27.84 kg/m.  Estimated Nutritional Needs:   Kcal:  6151-8343  Protein:  90-105 grams  Fluid:  > 1.7 L    Shakenya Stoneberg A. Jimmye Norman, RD, LDN, CDE Pager: [phone] After hours Pager: [phone]

## 2017-11-25 NOTE — Progress Notes (Signed)
10 Days Post-Op   Subjective/Chief Complaint: Pt reports having several small bm's and passing flatus Ng output down   Objective: Vital signs in last 24 hours: Temp:  [97.7 F (36.5 C)-98.3 F (36.8 C)] 98.1 F (36.7 C) (10/31 0511) Pulse Rate:  [59-65] 63 (10/31 0511) Resp:  [14-18] 14 (10/31 0511) BP: (107-121)/(50-57) 117/50 (10/31 0511) SpO2:  [96 %-100 %] 100 % (10/31 0511) Last BM Date: 11/23/17  Intake/Output from previous day: 10/30 0701 - 10/31 0700 In: 4117.9 [P.O.:220; I.V.:3679.4; IV Piggyback:218.5] Out: 3200 [Urine:2500; Emesis/NG output:700] Intake/Output this shift: No intake/output data recorded.  Exam: Awake and alert Abdomen still mildly full but minimally tender Lungs clear Incision clean  Lab Results:  Recent Labs    11/23/17 0337 11/25/17 0422  WBC 16.2* 11.4*  HGB 7.9* 8.0*  HCT 28.0* 27.7*  PLT 367 376   BMET Recent Labs    11/24/17 0415 11/25/17 0422  NA 147* 141  K 4.0 3.6  CL 119* 117*  CO2 22 20*  GLUCOSE 270* 205*  BUN 38* 30*  CREATININE 1.31* 1.13  CALCIUM 8.0* 7.9*   PT/INR No results for input(s): LABPROT, INR in the last 72 hours. ABG No results for input(s): PHART, HCO3 in the last 72 hours.  Invalid input(s): PCO2, PO2  Studies/Results: Dg Abd Portable 1v  Result Date: 11/23/2017 CLINICAL DATA:  Nasogastric tube placement. EXAM: PORTABLE ABDOMEN - 1 VIEW COMPARISON:  Abdominal radiograph November 14, 2017 FINDINGS: Nasogastric tube looped in proximal stomach, side port projecting distal to GE junction. Multiple loops of gas distended small bowel measuring to at least 4 cm. Air within large bowel without dilatation. No intra-abdominal mass effect. Soft tissue planes and included osseous structures are unchanged. IMPRESSION: Nasogastric tube tip projects in proximal stomach. Mild ileus versus early small bowel obstruction. Electronically Signed   By: Elon Alas M.D.   On: 11/23/2017 21:42     Anti-infectives: Anti-infectives (From admission, onward)   Start     Dose/Rate Route Frequency Ordered Stop   11/15/17 1400  piperacillin-tazobactam (ZOSYN) IVPB 3.375 g     3.375 g 12.5 mL/hr over 240 Minutes Intravenous Every 8 hours 11/15/17 0718     11/15/17 0800  piperacillin-tazobactam (ZOSYN) IVPB 3.375 g     3.375 g 100 mL/hr over 30 Minutes Intravenous  Once 11/15/17 0718 11/15/17 0923   11/10/17 1210  ceFAZolin (ANCEF) 2-4 GM/100ML-% IVPB    Note to Pharmacy:  [name], [place]   : cabinet override      11/10/17 1210 11/10/17 1451   11/10/17 1200  ceFAZolin (ANCEF) IVPB 2g/100 mL premix     2 g 200 mL/hr over 30 Minutes Intravenous On call to O.R. 11/10/17 1158 11/10/17 1501      Assessment/Plan: s/p Procedure(s): LEFT INGUINAL HERNIA REPAIR WITH MESH (Left) GROIN EXPLORATION WITH REMOVAL OF INGUINAL HERNIA MESH (Left) SMALL BOWEL RESECTION (N/A)  Continuing to improve Creatinine back to normal NG outpt down Malnutrition.  Continue TNA and d/c NG and start liquids PT for deconditioning Will eventually need rehag  LOS: 11 days    Kolbee Stallman A 11/25/2017

## 2017-11-25 NOTE — Progress Notes (Signed)
[facility] Progress Note    Assessment/ Plan:   84 y.o.malewith open repair of an incarcerated left inguinal hernia repair 10/16 with Prolene mesh w/ CT showing recurrent hernia w/ small bowel->10/21/2019revision with partial resection of small bowelfor ischemia. Being seen for hypernatremia and AKI.   1. Renal failure - baseline creatinine is 1 and now with worsening and fluctuating renal function over the past 1-1/2 weeks. Microscopy suggests ATN which would be consistent with possible infection, volume down (based on history and I&O during the current hospitalization) + recent relative hypotension.  Renal function is back to baseline. Will sign off at this time; reconsult as needed.    2. Hypernatremia - free water deficit of 4.7L, unclear what ongoing free water losses arebut appears to be mainly from the NG based on volumes.  - UOP very good and SNa trending down (now 141) -> will d/c the D5W. Most of the free water loss was via NGT and pt improving  3. Hernia s/p repair 4. Wound infection? - on zosyn 5. CLL 6. DM 7. Anemia  Subjective:   Feeling a lot better today with less discomfort in the abdomen and good pain control  Tolerating clears  Denies f/c/n/v/dyspnea   Objective:   BP (!) 117/50 (BP Location: Left Arm)   Pulse 63   Temp 98.1 F (36.7 C) (Oral)   Resp 14   Ht 5' 5.98" (1.676 m)   Wt 78.2 kg   SpO2 100%   BMI 27.84 kg/m   Intake/Output Summary (Last 24 hours) at 11/25/2017 1207 Last data filed at 11/25/2017 1136 Gross per 24 hour  Intake 4354.91 ml  Output 3300 ml  Net 1054.91 ml   Weight change:   Physical Exam: General appearance:alert and appears stated age, pleasant Head:NCAT Resp:poor air movement but no rales Cardio:regular rate and rhythm, S1, S2 normal, no murmur, click, rub or gallop MW:NUUVOZDG, mild TTP but no rebound, definitely improved Extremities:[place] Pulses:2+ and  symmetric  Imaging: Dg Abd Portable 1v  Result Date: 11/23/2017 CLINICAL DATA:  Nasogastric tube placement. EXAM: PORTABLE ABDOMEN - 1 VIEW COMPARISON:  Abdominal radiograph November 14, 2017 FINDINGS: Nasogastric tube looped in proximal stomach, side port projecting distal to GE junction. Multiple loops of gas distended small bowel measuring to at least 4 cm. Air within large bowel without dilatation. No intra-abdominal mass effect. Soft tissue planes and included osseous structures are unchanged. IMPRESSION: Nasogastric tube tip projects in proximal stomach. Mild ileus versus early small bowel obstruction. Electronically Signed   By: Elon Alas M.D.   On: 11/23/2017 21:42    Labs: BMET Recent Labs  Lab 11/21/17 0321 11/22/17 0336 11/22/17 1958 11/23/17 0337 11/23/17 1859 11/24/17 0415 11/25/17 0422  NA 156* 155* 152* 154* 149* 147* 141  K 3.9 3.9 4.1 3.9 3.8 4.0 3.6  CL 127* 126* 124* 124* 121* 119* 117*  CO2 25 24 24 23 22 22  20*  GLUCOSE 330* 272* 289* 158* 336* 270* 205*  BUN 45* 49* 48* 47* 44* 38* 30*  CREATININE 1.74* 2.02* 1.96* 1.70* 1.57* 1.31* 1.13  CALCIUM 7.5* 7.6* 7.5* 7.9* 7.8* 8.0* 7.9*  PHOS  --   --   --  2.9  --  2.9 2.9   CBC Recent Labs  Lab 11/21/17 0321 11/22/17 0336 11/23/17 0337 11/25/17 0422  WBC 17.7* 19.3* 16.2* 11.4*  NEUTROABS 12.1* 16.6* 10.5*  --   HGB 8.1* 8.3* 7.9* 8.0*  HCT 28.1* 29.3* 28.0* 27.7*  MCV 89.5 90.4  92.1 90.5  PLT 298 351 367 376    Medications:    . acetaminophen  650 mg Oral Q6H  . ibrutinib  280 mg Oral Daily  . insulin aspart  0-20 Units Subcutaneous Q4H  . mouth rinse  15 mL Mouth Rinse BID  . tamsulosin  0.4 mg Oral QPC supper      Otelia Santee, MD 11/25/2017, 12:07 PM

## 2017-11-25 NOTE — Telephone Encounter (Signed)
NG out 10/30 thru 11/29 - moved 11/5 f/u to LT. Left message for patient re change and new time. Schedule mailed. Patient also my chart active.

## 2017-11-25 NOTE — Progress Notes (Signed)
PHARMACY - ADULT TOTAL PARENTERAL NUTRITION CONSULT NOTE   Pharmacy Consult:  TPN Indication: Prolonged Ileus  Patient Measurements: Height: 5' 5.98" (167.6 cm) Weight: 172 lb 6.4 oz (78.2 kg) IBW/kg (Calculated) : 63.76 TPN AdjBW (KG): 69.9 Body mass index is 27.84 kg/m.  Assessment:  [name] presents on 10/15 with a L inguinal hernia. Surgery took to OR to repair on 10/16. Unfortunately he developed a hematoma at the operative site. Was then to be discharged on 10/18 but his wound started draining seroma fluid so kept inpatient. Then developed N/V and found to have an ileus. Has been getting conservative management but now has been over a week out from initial surgery and not tolerating CLD. Pharmacy consulted to start TPN for prolonged ileus.  GI: prealbumin low at 7.4, NG O/P down 717mL, BM x3 - Reglan PRN Endo: DM - CBGs elevated but improving Insulin requirements in the past 24 hours: 43 units SSI + 35 units insulin in TPN Lytes: Na 154>141, K down to 3.6 (goal >/= 4 for ileus), high CL and low acetate, Mag high at 2.6 Renal: SCr down to 1.13, BUN 30 - UOP 1.3 ml/kg/hr, D5W at 100 ml/hr (Renal may stop today) - Flomax Pulm: remains on 2L [place] Cards: VSS Hepatobil: LFTs / tbili / TG WNL  Onc: ibrutinib - CBC stable Neuro: scheduled APAP (refusing some doses), PRN Dilaudid ID: [email or web address] for wound infxn - afebrile, WBC down to 11.4 TPN Access: PICC 11/22/17 TPN start date: 11/22/17  Nutritional Goals (per RD rec on 10/28): 1801 kcal, 89-106 g, >2 L fluid per day  Current Nutrition:  CLD started 10/31 TPN  Plan:  Continue TPN at 85 ml/hr, providing 102g AA, 245g CHO and 61g ILE for a total of 1852 kCal, meeting 100% of patient needs Electrolytes in TPN: no Na, increase K, decrease Mag, max acetate Add MVI, trace elements, and famotidine 20mg  to TPN Continue rSSI Q4H + 35 units regular insulin in TPN (will not increase insulin today as Renal will likely stop D5W = 120gm CHO per  day) F/U AM labs, diet advancement to start weaning TPN, CBGs   Michael [name], PharmD, BCPS, [place] 11/25/2017, 9:24 AM

## 2017-11-26 LAB — BASIC METABOLIC PANEL
ANION GAP: 6 (ref 5–15)
BUN: 29 mg/dL — ABNORMAL HIGH (ref 8–23)
CHLORIDE: 116 mmol/L — AB (ref 98–111)
CO2: 21 mmol/L — ABNORMAL LOW (ref 22–32)
Calcium: 8.1 mg/dL — ABNORMAL LOW (ref 8.9–10.3)
Creatinine, Ser: 1.06 mg/dL (ref 0.61–1.24)
GFR calc Af Amer: >60 mL/min (ref >60)
GLUCOSE: 139 mg/dL — AB (ref 70–99)
POTASSIUM: 3.6 mmol/L (ref 3.5–5.1)
Sodium: 143 mmol/L (ref 135–145)

## 2017-11-26 LAB — MAGNESIUM: Magnesium: 2.3 mg/dL (ref 1.7–2.4)

## 2017-11-26 LAB — GLUCOSE, CAPILLARY
GLUCOSE-CAPILLARY: 115 mg/dL — AB (ref 70–99)
GLUCOSE-CAPILLARY: 178 mg/dL — AB (ref 70–99)
GLUCOSE-CAPILLARY: 183 mg/dL — AB (ref 70–99)
Glucose-Capillary: 163 mg/dL — ABNORMAL HIGH (ref 70–99)
Glucose-Capillary: 178 mg/dL — ABNORMAL HIGH (ref 70–99)

## 2017-11-26 MED ORDER — TRAVASOL 10 % IV SOLN
INTRAVENOUS | Status: AC
  Administered 2017-11-26: 19:00:00 via INTRAVENOUS
  Filled 2017-11-26: qty 780

## 2017-11-26 MED ORDER — COLLAGENASE 250 UNIT/GM EX OINT
TOPICAL_OINTMENT | Freq: Every day | CUTANEOUS | Status: DC
  Administered 2017-11-26: 1 via TOPICAL
  Administered 2017-11-27 – 2017-11-30 (×4): via TOPICAL
  Filled 2017-11-26: qty 30

## 2017-11-26 NOTE — Consult Note (Signed)
[facility] Nurse wound consult note Reason for Consult: Consult requested for left buttock. Pt went to the OR on 10/21. Wound type: Dark reddish-purple deep tissue pressure injury which is beginning to evolve into unstageable in patchy areas of slough Pressure Injury POA: No Measurement: 7X6cm Drainage (amount, consistency, odor) small amt tan drainage, no odor Periwound: intact skin surrounding. Dressing procedure/placement/frequency: Santyl ointment to provide enzymatic debridement of nonviable tissue.  Discussed plan of care with patient and family member at the bedside.   [facility] team will assess weekly. Julien Girt MSN, RN, [place], [place], [place]

## 2017-11-26 NOTE — Progress Notes (Signed)
11 Days Post-Op   Subjective/Chief Complaint: Comfortable this morning Have several more BM's   Objective: Vital signs in last 24 hours: Temp:  [98.3 F (36.8 C)-98.6 F (37 C)] 98.3 F (36.8 C) (10/31 2034) Pulse Rate:  [63-67] 63 (10/31 2034) Resp:  [17] 17 (10/31 2034) BP: (121-122)/(56) 121/56 (10/31 2034) SpO2:  [100 %] 100 % (10/31 2034) Last BM Date: 11/25/17  Intake/Output from previous day: 10/31 0701 - 11/01 0700 In: 1487.7 [P.O.:537; I.V.:801.4; IV Piggyback:149.4] Out: 1900 [Urine:1900] Intake/Output this shift: No intake/output data recorded.  Exam: Abdomen softer, less distended, good BS Lungs clear Incision clean  Lab Results:  Recent Labs    11/25/17 0422  WBC 11.4*  HGB 8.0*  HCT 27.7*  PLT 376   BMET Recent Labs    11/25/17 0422 11/26/17 0359  NA 141 143  K 3.6 3.6  CL 117* 116*  CO2 20* 21*  GLUCOSE 205* 139*  BUN 30* 29*  CREATININE 1.13 1.06  CALCIUM 7.9* 8.1*   PT/INR No results for input(s): LABPROT, INR in the last 72 hours. ABG No results for input(s): PHART, HCO3 in the last 72 hours.  Invalid input(s): PCO2, PO2  Studies/Results: Dg Abd 1 View  Result Date: 11/25/2017 CLINICAL DATA:  Follow-up ileus. EXAM: ABDOMEN - 1 VIEW COMPARISON:  November 23, 2017 FINDINGS: Air-filled dilated loops of bowel, primarily small bowel, are again identified. There is gas in the colon to the level of the splenic flexure. There is little gas distal to this level. No other acute abnormalities. IMPRESSION: Probable ileus, similar in the interval. Electronically Signed   By: Dorise Bullion III M.D   On: 11/25/2017 19:51    Anti-infectives: Anti-infectives (From admission, onward)   Start     Dose/Rate Route Frequency Ordered Stop   11/15/17 1400  piperacillin-tazobactam (ZOSYN) IVPB 3.375 g     3.375 g 12.5 mL/hr over 240 Minutes Intravenous Every 8 hours 11/15/17 0718     11/15/17 0800  piperacillin-tazobactam (ZOSYN) IVPB 3.375 g     3.375 g 100 mL/hr over 30 Minutes Intravenous  Once 11/15/17 0718 11/15/17 0923   11/10/17 1210  ceFAZolin (ANCEF) 2-4 GM/100ML-% IVPB    Note to Pharmacy:  [name], [place]   : cabinet override      11/10/17 1210 11/10/17 1451   11/10/17 1200  ceFAZolin (ANCEF) IVPB 2g/100 mL premix     2 g 200 mL/hr over 30 Minutes Intravenous On call to O.R. 11/10/17 1158 11/10/17 1501      Assessment/Plan: s/p Procedure(s): LEFT INGUINAL HERNIA REPAIR WITH MESH (Left) GROIN EXPLORATION WITH REMOVAL OF INGUINAL HERNIA MESH (Left) SMALL BOWEL RESECTION (N/A)   LOS: 12 days   Continuing TNA probably through the weekend Keep on clear liquids one more day Continue working with PT D/c antibiotics  [place] Penning A 11/26/2017

## 2017-11-26 NOTE — Progress Notes (Signed)
PHARMACY - ADULT TOTAL PARENTERAL NUTRITION CONSULT NOTE   Pharmacy Consult:  TPN Indication: Prolonged Ileus  Patient Measurements: Height: 5' 5.98" (167.6 cm) Weight: 172 lb 6.4 oz (78.2 kg) IBW/kg (Calculated) : 63.76 TPN AdjBW (KG): 69.9 Body mass index is 27.84 kg/m.  Assessment:  Michael [name] presents on 10/15 with a L inguinal hernia. Surgery took to OR to repair on 10/16. Unfortunately he developed a hematoma at the operative site. Was then to be discharged on 10/18 but his wound started draining seroma fluid so kept inpatient. Then developed N/V and found to have an ileus. Has been getting conservative management but now has been over a week out from initial surgery and not tolerating CLD. Pharmacy consulted to start TPN for prolonged ileus.  GI: prealbumin low at 7.4, NG O/P down 742mL, LBM 11/1, per surgery to continue on clears today, boost supplements added - 1 already given this AM, f/u advancement of diet for ability to wean TPN - Reglan PRN Endo: DM - CBGs 110-220 since stopping D5W - improved Insulin requirements in the past 24 hours: 33 units SSI + 35 units insulin in TPN Lytes: Na wnl, K 3.6 (goal >/= 4 for ileus), high CL and low acetate, Mg down to 2.3 Renal: SCr down to 1.06, BUN 29 - UOP 1 ml/kg/hr, IVF d/ced stopped early 10/31 AM - Flomax Pulm: remains on 2L [place] Cards: VSS Hepatobil: LFTs / tbili / TG WNL  Onc: ibrutinib - CBC stable Neuro: scheduled APAP (refusing some doses), PRN Dilaudid ID: s/p Zosyn for wound infxn - afebrile, WBC down to 11.4 TPN Access: PICC 11/22/17 TPN start date: 11/22/17  Nutritional Goals (per RD rec on 10/31): 1750-1950 kcal, 90-105 g, >1.7 L fluid per day  Current Nutrition:  CLD started 10/31 (20-25% of meals charted) Boost tid started 11/1 (each will provide 250 kcal, 9g protein) TPN  Plan:  Reduce TPN to 65 ml/hr, providing 78g AA, 187g CHO and 42g ILE for a total of 1368 kCal, meeting 78% of patient needs (however with a min  of 2 boost supplements/day will provide 100% of needs) Electrolytes in TPN: continue with current lytes except increase K to 35 mEq/L, continue with max acetate Add MVI, trace elements, and famotidine 20mg  to TPN Continue rSSI Q4H + and reduce to 25 units regular insulin in TPN with rate reduction F/U AM labs, diet advancement to start weaning TPN, CBGs  Thank you for allowing pharmacy to be a part of this patient's care.  Alycia Rossetti, PharmD, BCPS Clinical Pharmacist Pager: [phone] Clinical phone for 11/26/2017 from 7a-3:30p: [phone] If after 3:30p, please call main pharmacy at: [phone] Please check AMION for all [place] numbers 11/26/2017 11:32 AM

## 2017-11-26 NOTE — Progress Notes (Signed)
Occupational Therapy Treatment Patient Details Name: Michael [name] MRN: [id number] DOB: Jul 02, 1930 Today's Date: 11/26/2017    History of present illness Pt is a 84 y.o. M with significant PMH of CLL who presents s/p left inguinal hernia repair with mesh and small bowel resection   OT comments  Pt completed OOB to chair transfer and basic transfer with RW total+2 min (A) with music this session. Pt progress with RW use to simulate home level transfer for adls. Pt incontinent of bowel and unaware at this time. Recommend wound care to address buttock as pt has open wounds present. Recommend air mattress overlay to reduce pressure and to prevent further skin break down. Pt with preferred L side sitting position.   Follow Up Recommendations  CIR    Equipment Recommendations  Other (comment)(TBA at next venue)    Recommendations for Other Services Other (comment)(wound care consult/ palliative/ airmattress overlay)    Precautions / Restrictions Precautions Precautions: Fall Restrictions Weight Bearing Restrictions: No       Mobility Bed Mobility Overal bed mobility: Needs Assistance Bed Mobility: Supine to Sit   Sidelying to sit: Mod assist;+2 for physical assistance       General bed mobility comments: pt requires (A) to elevate from bed surface. pt with a extended posture and only with flexion at hips once elevated fully. pt reports pain at his neck. pt favors L side sitting at this time  Transfers Overall transfer level: Needs assistance Equipment used: Rolling walker (2 wheeled) Transfers: Sit to/from Stand Sit to Stand: Mod assist;+2 physical assistance;+2 safety/equipment;From elevated surface Stand pivot transfers: Mod assist;+2 physical assistance;+2 safety/equipment;From elevated surface       General transfer comment: pt requires (A) to power up from sitting and counting helps patient initiate the task    Balance Overall balance assessment: Needs  assistance Sitting-balance support: Bilateral upper extremity supported;Feet supported Sitting balance-Leahy Scale: Fair     Standing balance support: Bilateral upper extremity supported;During functional activity Standing balance-Leahy Scale: Poor Standing balance comment: heavy reliance on RW                           ADL either performed or assessed with clinical judgement   ADL   Eating/Feeding: Set up Eating/Feeding Details (indicate cue type and reason): drinking water this session Grooming: Wash/dry face;Set up;Sitting Grooming Details (indicate cue type and reason): wiping nose with tissues Upper Body Bathing: Minimal assistance   Lower Body Bathing: Total assistance                 Toileting - Clothing Manipulation Details (indicate cue type and reason): incontinent of bowel on arrival and no awareness. pt with noted skin break down on sacrum and L buttock skin fold. RN called to room to visualize. foam dressing applied     Functional mobility during ADLs: +2 for physical assistance;Minimal assistance;Rolling walker General ADL Comments: pt requires encouragement to keep going. pt needs (A) to power up from surface and to sustain the task. pt motivated by music played and visual goals ( walk to the black line)     Vision       Perception     Praxis      Cognition Arousal/Alertness: Awake/alert Behavior During Therapy: WFL for tasks assessed/performed Overall Cognitive Status: Impaired/Different from baseline Area of Impairment: Attention  General Comments: patient grimacing and moaning with the light of touch at times. pt responds to movement of staff without tactile input with verbal "wait a minute" pt unable to verbalize visitor present name and deflected by stating "no one told me nothing about where you were"        Exercises     Shoulder Instructions       General Comments wound on buttock  under sacral dressing. pt with open wound on L buttock skin fold. Barrier cream and pad applied. RN aware. Recommend wound consult    Pertinent Vitals/ Pain       Pain Assessment: Faces Faces Pain Scale: Hurts even more Pain Location: neck / L wrist Pain Descriptors / Indicators: Grimacing Pain Intervention(s): RN gave pain meds during session;Repositioned;Monitored during session;Premedicated before session  Home Living                                          Prior Functioning/Environment              Frequency  Min 2X/week        Progress Toward Goals  OT Goals(current goals can now be found in the care plan section)  Progress towards OT goals: Progressing toward goals  Acute Rehab OT Goals Patient Stated Goal: nothing specifically stated OT Goal Formulation: With patient/family Time For Goal Achievement: 12/02/17 Potential to Achieve Goals: Good ADL Goals Pt Will Perform Grooming: with modified independence;sitting;standing Pt Will Perform Lower Body Bathing: with modified independence;sit to/from stand Pt Will Perform Lower Body Dressing: with modified independence;sit to/from stand Pt Will Transfer to Toilet: with modified independence;ambulating Pt Will Perform Toileting - Clothing Manipulation and hygiene: with modified independence;sit to/from stand  Plan Discharge plan remains appropriate    Co-evaluation                 AM-PAC PT "6 Clicks" Daily Activity     Outcome Measure   Help from another person eating meals?: None Help from another person taking care of personal grooming?: A Little Help from another person toileting, which includes using toliet, bedpan, or urinal?: A Lot Help from another person bathing (including washing, rinsing, drying)?: A Lot Help from another person to put on and taking off regular upper body clothing?: A Little Help from another person to put on and taking off regular lower body clothing?: A  Lot 6 Click Score: 16    End of Session Equipment Utilized During Treatment: Gait belt;Rolling walker  OT Visit Diagnosis: Unsteadiness on feet (R26.81);Pain;Muscle weakness (generalized) (M62.81);Other symptoms and signs involving cognitive function   Activity Tolerance Patient tolerated treatment well   Patient Left in chair;with call bell/phone within reach;with family/visitor present;Other (comment)(geo mat X2 pillows)   Nurse Communication Mobility status;Precautions        Time: 1030-1110 OT Time Calculation (min): 40 min  Charges: OT General Charges $OT Visit: 1 Visit OT Treatments $Self Care/Home Management : 8-22 mins   Jeri Modena, OTR/L  Acute Rehabilitation Services Pager: [phone] Office: [phone] .    Parke Poisson B 11/26/2017, 11:36 AM

## 2017-11-26 NOTE — Care Management Important Message (Signed)
Important Message  Patient Details  Name: Michael [name] MRN: [id number] Date of Birth: [birth year]   Medicare Important Message Given:  Yes    Nyima Vanacker P [place] [facility] 11/26/2017, 4:12 PM

## 2017-11-26 NOTE — Progress Notes (Signed)
Physical Therapy Treatment Patient Details Name: Michael [name] MRN: [id number] DOB: [birth year] Today's Date: 11/26/2017    History of Present Illness Pt is a 84 y.o. M with significant PMH of CLL who presents s/p left inguinal hernia repair with mesh and small bowel resection    PT Comments    Patient progressing slowly towards PT goals. Improved ambulation distance today with Min A of 2 for safety and chair follow. Used music and encouragement to increase activity. Requires assist for all aspects of mobility. Seems to still be confused and masks this with humor. Able to stand for a few mins for pericare-not able to notify nursing that he has had a BM. Open wound on left buttock; RN is aware. Recommend overlay mattress to help with current wound and prevent further. Educated pt on pressure relief- placed geomat and 2 pillows in chair and encouraged weight shifting frequently. Motivated to get better.  Will follow.   Follow Up Recommendations  SNF     Equipment Recommendations  None recommended by PT    Recommendations for Other Services       Precautions / Restrictions Precautions Precautions: Fall Restrictions Weight Bearing Restrictions: No    Mobility  Bed Mobility Overal bed mobility: Needs Assistance Bed Mobility: Supine to Sit   Sidelying to sit: Mod assist;+2 for physical assistance Supine to sit: Mod assist;HOB elevated     General bed mobility comments: pt requires (A) to elevate from bed surface. pt with an extended posture and only with flexion at hips once elevated fully. pt reports pain at his neck. pt favors Lft side sitting at this time  Transfers Overall transfer level: Needs assistance Equipment used: Rolling walker (2 wheeled) Transfers: Sit to/from Stand Sit to Stand: Mod assist;+2 physical assistance;+2 safety/equipment;From elevated surface Stand pivot transfers: Mod assist;+2 physical assistance;+2 safety/equipment;From elevated surface        General transfer comment: pt requires (A) to power up from sitting and counting helps patient initiate the task. stood from EOB x2, from chair x1. Transferred to chair post ambulation.  Ambulation/Gait Ambulation/Gait assistance: Min assist;+2 safety/equipment Gait Distance (Feet): 45 Feet(+ 40') Assistive device: Rolling walker (2 wheeled) Gait Pattern/deviations: Step-through pattern;Decreased stride length;Trunk flexed Gait velocity: decreased   General Gait Details: Slow, unsteady gait with assist for RW management, posture and safety. 1 seated rest break. 2/4 DOE.   Stairs             Wheelchair Mobility    Modified Rankin (Stroke Patients Only)       Balance Overall balance assessment: Needs assistance Sitting-balance support: Bilateral upper extremity supported;Feet supported Sitting balance-Leahy Scale: Fair     Standing balance support: During functional activity;Bilateral upper extremity supported Standing balance-Leahy Scale: Poor Standing balance comment: heavy reliance on RW; Able to stand for 2-3 mins for pericare.                             Cognition Arousal/Alertness: Awake/alert Behavior During Therapy: WFL for tasks assessed/performed Overall Cognitive Status: Impaired/Different from baseline Area of Impairment: Attention                               General Comments: patient grimacing and moaning with the lightest of touch at times. pt responds to movement of staff without tactile input with verbal "wait a minute" pt unable to verbalize visitor present name and deflected by stating "  no one told me nothing about where you were"      Exercises      General Comments General comments (skin integrity, edema, etc.): Open wound on left buttock in skin fold; barrier cream and pad applied. RN present and aware.       Pertinent Vitals/Pain Pain Assessment: Faces Faces Pain Scale: Hurts even more Pain Location: neck / L  wrist Pain Descriptors / Indicators: Grimacing Pain Intervention(s): RN gave pain meds during session;Monitored during session;Limited activity within patient's tolerance    Home Living                      Prior Function            PT Goals (current goals can now be found in the care plan section) Acute Rehab PT Goals Patient Stated Goal: nothing specifically stated Progress towards PT goals: Progressing toward goals    Frequency    Min 2X/week      PT Plan Current plan remains appropriate    Co-evaluation PT/OT/SLP Co-Evaluation/Treatment: Yes Reason for Co-Treatment: For patient/therapist safety;To address functional/ADL transfers PT goals addressed during session: Mobility/safety with mobility;Balance        AM-PAC PT "6 Clicks" Daily Activity  Outcome Measure  Difficulty turning over in bed (including adjusting bedclothes, sheets and blankets)?: Unable Difficulty moving from lying on back to sitting on the side of the bed? : Unable Difficulty sitting down on and standing up from a chair with arms (e.g., wheelchair, bedside commode, etc,.)?: Unable Help needed moving to and from a bed to chair (including a wheelchair)?: A Little Help needed walking in hospital room?: A Little Help needed climbing 3-5 steps with a railing? : Total 6 Click Score: 10    End of Session Equipment Utilized During Treatment: Gait belt Activity Tolerance: Patient tolerated treatment well Patient left: in chair;with call bell/phone within reach;with family/visitor present Nurse Communication: Mobility status PT Visit Diagnosis: Unsteadiness on feet (R26.81);Muscle weakness (generalized) (M62.81);Difficulty in walking, not elsewhere classified (R26.2);Pain Pain - part of body: (neck, left wrist)     Time: 7035-0093 PT Time Calculation (min) (ACUTE ONLY): 38 min  Charges:  $Gait Training: 8-22 mins $Therapeutic Activity: 8-22 mins                     Wray Kearns,  [place], DPT Acute Rehabilitation Services Pager [phone] Office [place] 11/26/2017, 12:13 PM

## 2017-11-27 LAB — GLUCOSE, CAPILLARY
GLUCOSE-CAPILLARY: 148 mg/dL — AB (ref 70–99)
GLUCOSE-CAPILLARY: 186 mg/dL — AB (ref 70–99)
Glucose-Capillary: 197 mg/dL — ABNORMAL HIGH (ref 70–99)
Glucose-Capillary: 169 mg/dL — ABNORMAL HIGH (ref 70–99)
Glucose-Capillary: 163 mg/dL — ABNORMAL HIGH (ref 70–99)
Glucose-Capillary: 168 mg/dL — ABNORMAL HIGH (ref 70–99)

## 2017-11-27 MED ORDER — TRAVASOL 10 % IV SOLN
INTRAVENOUS | Status: AC
  Administered 2017-11-27: 18:00:00 via INTRAVENOUS
  Filled 2017-11-27: qty 780

## 2017-11-27 MED ORDER — DIPHENHYDRAMINE HCL 25 MG PO CAPS
25.0000 mg | ORAL_CAPSULE | Freq: Once | ORAL | Status: DC
  Filled 2017-11-27: qty 1

## 2017-11-27 NOTE — Progress Notes (Signed)
Spoke with Suzette Battiest and asked him to remove the PIV due to non usage and decrease the risk of infection

## 2017-11-27 NOTE — Progress Notes (Signed)
PHARMACY - ADULT TOTAL PARENTERAL NUTRITION CONSULT NOTE   Pharmacy Consult:  TPN Indication: Prolonged Ileus  Patient Measurements: Height: 5' 5.98" (167.6 cm) Weight: 172 lb 6.4 oz (78.2 kg) IBW/kg (Calculated) : 63.76 TPN AdjBW (KG): 69.9 Body mass index is 27.84 kg/m.  Assessment:  [name] presents on 10/15 with a L inguinal hernia. Surgery took to OR to repair on 10/16. Unfortunately he developed a hematoma at the operative site. Was then to be discharged on 10/18 but his wound started draining seroma fluid so kept inpatient. Then developed N/V and found to have an ileus. Has been getting conservative management but now has been over a week out from initial surgery and not tolerating CLD. Pharmacy consulted to manage TPN for prolonged ileus.  GI: prealbumin low at 7.4, LBM 11/1 - Reglan PRN Endo: DM - CBGs 148-198, trending up Insulin requirements in the past 24 hours: 16 units SSI + 25 units insulin in TPN Lytes: 11/1 labs - K 3.6 (goal >/= 4 for ileus), high CL and low acetate, Mg down to 2.3 Renal: 11/1 labs - SCr down to 1.06, BUN 29 - UOP 1 ml/kg/hr - Flomax Pulm: [place] >> RA Cards: VSS Hepatobil: LFTs / tbili / TG WNL  Onc: ibrutinib for CLL - CBC stable Neuro: scheduled APAP, PRN Dilaudid/oxy ID: s/p Zosyn for wound infxn - afebrile, WBC down to 11.4 TPN Access: PICC 11/22/17 TPN start date: 11/22/17  Nutritional Goals (per RD rec on 10/31): 1750-1950 kcal, 90-105 g, >1.7 L fluid per day  Current Nutrition:  CLD started 10/31 (20-25% of meals charted) Boost tid started 11/1 (each will provide 250 kcal, 9g protein) TPN  Plan:  Continue TPN at 65 ml/hr, providing 78g AA, 187g CHO and 42g ILE for a total of 1368 kCal, meeting 78% of patient needs.  PO intake and supplements to meet the rest of patient's needs. Electrolytes in TPN: K increased 11/1, max acetate Add MVI, trace elements, and famotidine 20mg  to TPN Continue rSSI Q4H + increase regular insulin in TPN to 32  units F/U AM labs, PO intake and diet advancement, CBGs   Michael [name], PharmD, BCPS, [facility] 11/27/2017, 10:03 AM

## 2017-11-27 NOTE — Progress Notes (Signed)
Patient ID: Michael [name], male   DOB: 21-Oct-1930, 84 y.o.   MRN: [id number] . [facility] Progress Note:   12 Days Post-Op  Subjective: Mental status is fairly clear.   Objective: Vital signs in last 24 hours: Temp:  [97.7 F (36.5 C)-97.9 F (36.6 C)] 97.9 F (36.6 C) (11/02 0501) Pulse Rate:  [59-70] 70 (11/02 0501) Resp:  [18] 18 (11/02 0501) BP: (119-124)/(54-55) 121/55 (11/02 0501) SpO2:  [97 %-99 %] 97 % (11/02 0501)  Intake/Output from previous day: 11/01 0701 - 11/02 0700 In: 1310.7 [P.O.:100; I.V.:1210.7] Out: 2125 [Urine:2125] Intake/Output this shift: No intake/output data recorded.  Physical Exam: Work of breathing is not labored. Right inguinal incision is stapled and no erythema.  Taking po and with bowel activity.    Lab Results:  Results for orders placed or performed during the hospital encounter of 11/10/17 (from the past 48 hour(s))  Glucose, capillary     Status: Abnormal   Collection Time: 11/25/17 12:17 PM  Result Value Ref Range   Glucose-Capillary 197 (H) 70 - 99 mg/dL  Glucose, capillary     Status: Abnormal   Collection Time: 11/25/17  5:50 PM  Result Value Ref Range   Glucose-Capillary 223 (H) 70 - 99 mg/dL  Glucose, capillary     Status: Abnormal   Collection Time: 11/25/17  8:36 PM  Result Value Ref Range   Glucose-Capillary 161 (H) 70 - 99 mg/dL   Comment 1 Notify RN    Comment 2 Document in Chart   Glucose, capillary     Status: Abnormal   Collection Time: 11/25/17 11:32 PM  Result Value Ref Range   Glucose-Capillary 129 (H) 70 - 99 mg/dL   Comment 1 Notify RN    Comment 2 Document in Chart   Glucose, capillary     Status: Abnormal   Collection Time: 11/26/17  3:51 AM  Result Value Ref Range   Glucose-Capillary 115 (H) 70 - 99 mg/dL  Basic metabolic panel     Status: Abnormal   Collection Time: 11/26/17  3:59 AM  Result Value Ref Range   Sodium 143 135 - 145 mmol/L   Potassium 3.6 3.5 - 5.1 mmol/L   Chloride 116 (H)  98 - 111 mmol/L   CO2 21 (L) 22 - 32 mmol/L   Glucose, Bld 139 (H) 70 - 99 mg/dL   BUN 29 (H) 8 - 23 mg/dL   Creatinine, Ser 1.06 0.61 - 1.24 mg/dL   Calcium 8.1 (L) 8.9 - 10.3 mg/dL   GFR calc non Af Amer >60 >60 mL/min   GFR calc Af Amer >60 >60 mL/min    Comment: (NOTE) The eGFR has been calculated using the CKD EPI equation. This calculation has not been validated in all clinical situations. eGFR's persistently <60 mL/min signify possible Chronic Kidney Disease.    Anion gap 6 5 - 15    Comment: Performed at [facility] [address], [place],  [place] [address]  Magnesium     Status: None   Collection Time: 11/26/17  3:59 AM  Result Value Ref Range   Magnesium 2.3 1.7 - 2.4 mg/dL    Comment: Performed at [facility] [address], [place], [place] [address]  Glucose, capillary     Status: Abnormal   Collection Time: 11/26/17  7:59 AM  Result Value Ref Range   Glucose-Capillary 178 (H) 70 - 99 mg/dL   Comment 1 Notify RN   Glucose, capillary  Status: Abnormal   Collection Time: 11/26/17  1:00 PM  Result Value Ref Range   Glucose-Capillary 183 (H) 70 - 99 mg/dL   Comment 1 Notify RN   Glucose, capillary     Status: Abnormal   Collection Time: 11/26/17  5:18 PM  Result Value Ref Range   Glucose-Capillary 163 (H) 70 - 99 mg/dL   Comment 1 Notify RN   Glucose, capillary     Status: Abnormal   Collection Time: 11/26/17  8:50 PM  Result Value Ref Range   Glucose-Capillary 178 (H) 70 - 99 mg/dL  Glucose, capillary     Status: Abnormal   Collection Time: 11/27/17 12:25 AM  Result Value Ref Range   Glucose-Capillary 148 (H) 70 - 99 mg/dL  Glucose, capillary     Status: Abnormal   Collection Time: 11/27/17  3:44 AM  Result Value Ref Range   Glucose-Capillary 186 (H) 70 - 99 mg/dL  Glucose, capillary     Status: Abnormal   Collection Time: 11/27/17  8:45 AM  Result Value Ref Range   Glucose-Capillary 197 (H) 70 - 99 mg/dL    Radiology/Results: Dg  Abd 1 View  Result Date: 11/25/2017 CLINICAL DATA:  Follow-up ileus. EXAM: ABDOMEN - 1 VIEW COMPARISON:  November 23, 2017 FINDINGS: Air-filled dilated loops of bowel, primarily small bowel, are again identified. There is gas in the colon to the level of the splenic flexure. There is little gas distal to this level. No other acute abnormalities. IMPRESSION: Probable ileus, similar in the interval. Electronically Signed   By: Dorise Bullion III M.D   On: 11/25/2017 19:51    Anti-infectives: Anti-infectives (From admission, onward)   Start     Dose/Rate Route Frequency Ordered Stop   11/15/17 1400  piperacillin-tazobactam (ZOSYN) IVPB 3.375 g  Status:  Discontinued     3.375 g 12.5 mL/hr over 240 Minutes Intravenous Every 8 hours 11/15/17 0718 11/26/17 0821   11/15/17 0800  piperacillin-tazobactam (ZOSYN) IVPB 3.375 g     3.375 g 100 mL/hr over 30 Minutes Intravenous  Once 11/15/17 0718 11/15/17 0923   11/10/17 1210  ceFAZolin (ANCEF) 2-4 GM/100ML-% IVPB    Note to Pharmacy:  [name], [place]   : cabinet override      11/10/17 1210 11/10/17 1451   11/10/17 1200  ceFAZolin (ANCEF) IVPB 2g/100 mL premix     2 g 200 mL/hr over 30 Minutes Intravenous On call to O.R. 11/10/17 1158 11/10/17 1501      Assessment/Plan: Problem List: Patient Active Problem List   Diagnosis Date Noted  . Pressure injury of skin 11/22/2017  . Ileus ([place]) 11/14/2017  . S/P left inguinal hernia repair 11/10/2017  . Weight loss, non-intentional 04/12/2017  . Essential hypertension 10/20/2016  . Paresthesias 10/19/2016  . Left inguinal hernia 10/12/2016  . Protein-calorie malnutrition, mild ([place]) 01/10/2016  . Diarrhea due to drug 06/17/2015  . Chronic left shoulder pain 06/17/2015  . Bilateral leg edema 06/17/2015  . Pancytopenia, acquired ([place]) 04/22/2015  . Thrombocytopenia ([place]) 10/11/2014  . 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 CLL (chronic lymphocytic leukemia) ([place]) 12/08/2010    Deconditioned  man with underlying CLL and post bowel resection/inguinal hernia repair 12 Days Post-Op    LOS: 13 days   Matt B. Hassell Done, MD, [facility] [phone] beeper [phone]  11/27/2017 9:31 AM

## 2017-11-28 LAB — GLUCOSE, CAPILLARY
GLUCOSE-CAPILLARY: 146 mg/dL — AB (ref 70–99)
GLUCOSE-CAPILLARY: 215 mg/dL — AB (ref 70–99)
GLUCOSE-CAPILLARY: 216 mg/dL — AB (ref 70–99)
Glucose-Capillary: 135 mg/dL — ABNORMAL HIGH (ref 70–99)
Glucose-Capillary: 158 mg/dL — ABNORMAL HIGH (ref 70–99)
Glucose-Capillary: 177 mg/dL — ABNORMAL HIGH (ref 70–99)
Glucose-Capillary: 184 mg/dL — ABNORMAL HIGH (ref 70–99)

## 2017-11-28 LAB — C DIFFICILE QUICK SCREEN W PCR REFLEX
C DIFFICILE (CDIFF) TOXIN: NEGATIVE
C DIFFICLE (CDIFF) ANTIGEN: NEGATIVE
C Diff interpretation: NOT DETECTED

## 2017-11-28 LAB — BASIC METABOLIC PANEL
ANION GAP: 6 (ref 5–15)
BUN: 27 mg/dL — AB (ref 8–23)
CALCIUM: 8.3 mg/dL — AB (ref 8.9–10.3)
CO2: 21 mmol/L — ABNORMAL LOW (ref 22–32)
CREATININE: 0.87 mg/dL (ref 0.61–1.24)
Chloride: 115 mmol/L — ABNORMAL HIGH (ref 98–111)
GFR calc Af Amer: >60 mL/min (ref >60)
GFR calc non Af Amer: >60 mL/min (ref >60)
GLUCOSE: 204 mg/dL — AB (ref 70–99)
Potassium: 4.1 mmol/L (ref 3.5–5.1)
Sodium: 142 mmol/L (ref 135–145)

## 2017-11-28 LAB — MAGNESIUM: Magnesium: 2 mg/dL (ref 1.7–2.4)

## 2017-11-28 LAB — PHOSPHORUS: Phosphorus: 3.1 mg/dL (ref 2.5–4.6)

## 2017-11-28 MED ORDER — TRAVASOL 10 % IV SOLN
INTRAVENOUS | Status: AC
  Administered 2017-11-28: 17:00:00 via INTRAVENOUS
  Filled 2017-11-28: qty 1020

## 2017-11-28 NOTE — Progress Notes (Signed)
Patient having liquid stools multiple times during shift, moderate to large amounts. Paged provider.

## 2017-11-28 NOTE — Plan of Care (Signed)
  Problem: Education: Goal: Knowledge of General Education information will improve Description: Including pain rating scale, medication(s)/side effects and non-pharmacologic comfort measures Outcome: Progressing   Problem: Coping: Goal: Level of anxiety will decrease Outcome: Progressing   Problem: Safety: Goal: Ability to remain free from injury will improve Outcome: Progressing   

## 2017-11-28 NOTE — Progress Notes (Signed)
PHARMACY - ADULT TOTAL PARENTERAL NUTRITION CONSULT NOTE   Pharmacy Consult:  TPN Indication: Prolonged Ileus  Patient Measurements: Height: 5' 5.98" (167.6 cm) Weight: 172 lb 6.4 oz (78.2 kg) IBW/kg (Calculated) : 63.76 TPN AdjBW (KG): 69.9 Body mass index is 27.84 kg/m.  Assessment:  60 YOM presents on 10/15 with a L inguinal hernia. Surgery took to OR to repair on 10/16. Unfortunately he developed a hematoma at the operative site. Was then to be discharged on 10/18 but his wound started draining seroma fluid so kept inpatient. Then developed N/V and found to have an ileus. Has been getting conservative management but now has been over a week out from initial surgery and not tolerating CLD. Pharmacy consulted to manage TPN for prolonged ileus.  GI: prealbumin low at 7.4, diarrhea overnight - PRN Maalox//Zofran Endo: DM - CBGs acceptable Insulin requirements in the past 24 hours: 23 units SSI + 32 units insulin in TPN Lytes: high CL and low CO2, others WNL Renal: SCr down 0.87, BUN 27 - UOP 0.8 ml/kg/hr - Flomax Pulm: stable on RA Cards: VSS Hepatobil: LFTs / tbili / TG WNL  Onc: ibrutinib for CLL - CBC stable Neuro: scheduled APAP, PRN Dilaudid/oxy/Benadryl ID: s/p Zosyn for wound infxn - afebrile, WBC down to 11.4 TPN Access: PICC 11/22/17 TPN start date: 11/22/17  Nutritional Goals (per RD rec on 10/31): 1750-1950 kcal, 90-105 g, >1.7 L fluid per day  Current Nutrition:  CLD started 10/31.  <10% intake, feels bloated and nauseous Boost tid started 11/1 (each will provide 250 kcal, 9g protein) - charted but only drank several sips TPN  Plan:  Increase TPN back to goal rate of 85 ml/hr given minimal intake. TPN will provide 102g AA, 245g CHO and 55g ILE for a total of 1792 kCal, meeting 100% of patient needs.  Electrolytes in TPN: zero Na, max acetate - fluctuate with increasing TPN rate Add MVI, trace elements, and famotidine 20mg  to TPN Continue rSSI Q4H + increase  regular insulin in TPN to 50 units F/U AM labs, CBGs, PO intake and diet advancement to wean TPN   Ricardo Schubach D. Mina Marble, PharmD, BCPS, [facility] 11/28/2017, 10:08 AM

## 2017-11-28 NOTE — Progress Notes (Signed)
Patient ID: Michael [name], male   DOB: Jan 24, 1931, 84 y.o.   MRN: [id number] [facility] Progress Note:   13 Days Post-Op  Subjective: Mental status is alert and cooperative but quiet Objective: Vital signs in last 24 hours: Temp:  [97.8 F (36.6 C)-97.9 F (36.6 C)] 97.9 F (36.6 C) (11/03 0501) Pulse Rate:  [68-76] 76 (11/03 0501) Resp:  [16-17] 16 (11/03 0501) BP: (111-115)/(52-55) 115/52 (11/03 0501) SpO2:  [96 %-97 %] 96 % (11/03 0501)  Intake/Output from previous day: 11/02 0701 - 11/03 0700 In: 2952 [P.O.:387; I.V.:859] Out: 1650 [Urine:1650] Intake/Output this shift: No intake/output data recorded.  Physical Exam: Work of breathing is not labored.  TNA infusing and patient states that he will vomit if he eats too much.    Lab Results:  Results for orders placed or performed during the hospital encounter of 11/10/17 (from the past 48 hour(s))  Glucose, capillary     Status: Abnormal   Collection Time: 11/26/17  1:00 PM  Result Value Ref Range   Glucose-Capillary 183 (H) 70 - 99 mg/dL   Comment 1 Notify RN   Glucose, capillary     Status: Abnormal   Collection Time: 11/26/17  5:18 PM  Result Value Ref Range   Glucose-Capillary 163 (H) 70 - 99 mg/dL   Comment 1 Notify RN   Glucose, capillary     Status: Abnormal   Collection Time: 11/26/17  8:50 PM  Result Value Ref Range   Glucose-Capillary 178 (H) 70 - 99 mg/dL  Glucose, capillary     Status: Abnormal   Collection Time: 11/27/17 12:25 AM  Result Value Ref Range   Glucose-Capillary 148 (H) 70 - 99 mg/dL  Glucose, capillary     Status: Abnormal   Collection Time: 11/27/17  3:44 AM  Result Value Ref Range   Glucose-Capillary 186 (H) 70 - 99 mg/dL  Glucose, capillary     Status: Abnormal   Collection Time: 11/27/17  8:45 AM  Result Value Ref Range   Glucose-Capillary 197 (H) 70 - 99 mg/dL  Glucose, capillary     Status: Abnormal   Collection Time: 11/27/17 12:26 PM  Result Value Ref Range   Glucose-Capillary 169 (H) 70 - 99 mg/dL  Glucose, capillary     Status: Abnormal   Collection Time: 11/27/17  4:38 PM  Result Value Ref Range   Glucose-Capillary 163 (H) 70 - 99 mg/dL  Glucose, capillary     Status: Abnormal   Collection Time: 11/27/17  7:59 PM  Result Value Ref Range   Glucose-Capillary 168 (H) 70 - 99 mg/dL  Glucose, capillary     Status: Abnormal   Collection Time: 11/28/17 12:21 AM  Result Value Ref Range   Glucose-Capillary 146 (H) 70 - 99 mg/dL  Basic metabolic panel     Status: Abnormal   Collection Time: 11/28/17  3:19 AM  Result Value Ref Range   Sodium 142 135 - 145 mmol/L   Potassium 4.1 3.5 - 5.1 mmol/L   Chloride 115 (H) 98 - 111 mmol/L   CO2 21 (L) 22 - 32 mmol/L   Glucose, Bld 204 (H) 70 - 99 mg/dL   BUN 27 (H) 8 - 23 mg/dL   Creatinine, Ser 0.87 0.61 - 1.24 mg/dL   Calcium 8.3 (L) 8.9 - 10.3 mg/dL   GFR calc non Af Amer >60 >60 mL/min   GFR calc Af Amer >60 >60 mL/min    Comment: (NOTE) The eGFR has been calculated using  the CKD EPI equation. This calculation has not been validated in all clinical situations. eGFR's persistently <60 mL/min signify possible Chronic Kidney Disease.    Anion gap 6 5 - 15    Comment: Performed at [facility] [address], [place], [place] [address]  Magnesium     Status: None   Collection Time: 11/28/17  3:19 AM  Result Value Ref Range   Magnesium 2.0 1.7 - 2.4 mg/dL    Comment: Performed at [facility] [address] [place], [place] [address]  Phosphorus     Status: None   Collection Time: 11/28/17  3:19 AM  Result Value Ref Range   Phosphorus 3.1 2.5 - 4.6 mg/dL    Comment: Performed at [facility] [address] [place], [place] [address]  Glucose, capillary     Status: Abnormal   Collection Time: 11/28/17  3:29 AM  Result Value Ref Range   Glucose-Capillary 184 (H) 70 - 99 mg/dL  Glucose, capillary     Status: Abnormal   Collection Time: 11/28/17  7:52 AM  Result Value Ref  Range   Glucose-Capillary 177 (H) 70 - 99 mg/dL    Radiology/Results: No results found.  Anti-infectives: Anti-infectives (From admission, onward)   Start     Dose/Rate Route Frequency Ordered Stop   11/15/17 1400  piperacillin-tazobactam (ZOSYN) IVPB 3.375 g  Status:  Discontinued     3.375 g 12.5 mL/hr over 240 Minutes Intravenous Every 8 hours 11/15/17 0718 11/26/17 0821   11/15/17 0800  piperacillin-tazobactam (ZOSYN) IVPB 3.375 g     3.375 g 100 mL/hr over 30 Minutes Intravenous  Once 11/15/17 0718 11/15/17 0923   11/10/17 1210  ceFAZolin (ANCEF) 2-4 GM/100ML-% IVPB    Note to Pharmacy:  [name], [place]   : cabinet override      11/10/17 1210 11/10/17 1451   11/10/17 1200  ceFAZolin (ANCEF) IVPB 2g/100 mL premix     2 g 200 mL/hr over 30 Minutes Intravenous On call to O.R. 11/10/17 1158 11/10/17 1501      Assessment/Plan: Problem List: Patient Active Problem List   Diagnosis Date Noted  . Pressure injury of skin 11/22/2017  . Ileus ([place]) 11/14/2017  . S/P left inguinal hernia repair 11/10/2017  . Weight loss, non-intentional 04/12/2017  . Essential hypertension 10/20/2016  . Paresthesias 10/19/2016  . Left inguinal hernia 10/12/2016  . Protein-calorie malnutrition, mild ([place]) 01/10/2016  . Diarrhea due to drug 06/17/2015  . Chronic left shoulder pain 06/17/2015  . Bilateral leg edema 06/17/2015  . Pancytopenia, acquired ([place]) 04/22/2015  . Thrombocytopenia ([place]) 10/11/2014  . 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 CLL (chronic lymphocytic leukemia) ([place]) 12/08/2010    Chronic health issues, post op ileus, and deconditioning are slowing his postop progress which is slow.   13 Days Post-Op    LOS: 14 days   Matt B. Hassell Done, MD, [facility] [phone] beeper [phone]  11/28/2017 9:58 AM

## 2017-11-29 LAB — DIFFERENTIAL
Basophils Absolute: 0 10*3/uL (ref 0.0–0.1)
Basophils Relative: 0 %
Eosinophils Absolute: 0.5 10*3/uL (ref 0.0–0.5)
Eosinophils Relative: 4 %
LYMPHS PCT: 14 %
Lymphs Abs: 1.8 10*3/uL (ref 0.7–4.0)
Monocytes Absolute: 0.4 10*3/uL (ref 0.1–1.0)
Monocytes Relative: 3 %
NEUTROS ABS: 10.3 10*3/uL — AB (ref 1.7–7.7)
NRBC: 0 /100{WBCs}
Neutrophils Relative %: 77 %
PROMYELOCYTES RELATIVE: 2 %

## 2017-11-29 LAB — CBC
HCT: 24.9 % — ABNORMAL LOW (ref 39.0–52.0)
HEMOGLOBIN: 7.5 g/dL — AB (ref 13.0–17.0)
MCH: 26 pg (ref 26.0–34.0)
MCHC: 30.1 g/dL (ref 30.0–36.0)
MCV: 86.5 fL (ref 80.0–100.0)
Platelets: 414 10*3/uL — ABNORMAL HIGH (ref 150–400)
RBC: 2.88 MIL/uL — AB (ref 4.22–5.81)
RDW: 16.7 % — ABNORMAL HIGH (ref 11.5–15.5)
WBC: 13.1 10*3/uL — ABNORMAL HIGH (ref 4.0–10.5)
nRBC: 0.2 % (ref 0.0–0.2)

## 2017-11-29 LAB — COMPREHENSIVE METABOLIC PANEL
ALK PHOS: 82 U/L (ref 38–126)
ALT: 31 U/L (ref 0–44)
AST: 24 U/L (ref 15–41)
Albumin: 1.5 g/dL — ABNORMAL LOW (ref 3.5–5.0)
Anion gap: 9 (ref 5–15)
BUN: 28 mg/dL — AB (ref 8–23)
CHLORIDE: 111 mmol/L (ref 98–111)
CO2: 21 mmol/L — AB (ref 22–32)
CREATININE: 0.79 mg/dL (ref 0.61–1.24)
Calcium: 8.1 mg/dL — ABNORMAL LOW (ref 8.9–10.3)
GFR calc Af Amer: >60 mL/min (ref >60)
Glucose, Bld: 151 mg/dL — ABNORMAL HIGH (ref 70–99)
Potassium: 3.9 mmol/L (ref 3.5–5.1)
Sodium: 141 mmol/L (ref 135–145)
Total Bilirubin: 0.2 mg/dL — ABNORMAL LOW (ref 0.3–1.2)
Total Protein: 4.8 g/dL — ABNORMAL LOW (ref 6.5–8.1)

## 2017-11-29 LAB — GLUCOSE, CAPILLARY
Glucose-Capillary: 152 mg/dL — ABNORMAL HIGH (ref 70–99)
Glucose-Capillary: 153 mg/dL — ABNORMAL HIGH (ref 70–99)
Glucose-Capillary: 237 mg/dL — ABNORMAL HIGH (ref 70–99)
Glucose-Capillary: 231 mg/dL — ABNORMAL HIGH (ref 70–99)
Glucose-Capillary: 167 mg/dL — ABNORMAL HIGH (ref 70–99)

## 2017-11-29 LAB — TRIGLYCERIDES: TRIGLYCERIDES: 152 mg/dL — AB (ref <150)

## 2017-11-29 LAB — PHOSPHORUS: Phosphorus: 2.7 mg/dL (ref 2.5–4.6)

## 2017-11-29 LAB — PREALBUMIN: PREALBUMIN: 19.5 mg/dL (ref 18–38)

## 2017-11-29 LAB — MAGNESIUM: MAGNESIUM: 2 mg/dL (ref 1.7–2.4)

## 2017-11-29 MED ORDER — TRAVASOL 10 % IV SOLN
INTRAVENOUS | Status: DC
  Administered 2017-11-29: 17:00:00 via INTRAVENOUS
  Filled 2017-11-29: qty 1020

## 2017-11-29 MED ORDER — POLYETHYLENE GLYCOL 3350 17 G PO PACK
17.0000 g | PACK | Freq: Every day | ORAL | Status: DC
  Administered 2017-11-29 – 2017-12-04 (×6): 17 g via ORAL
  Filled 2017-11-29 (×6): qty 1

## 2017-11-29 MED ORDER — ENSURE ENLIVE PO LIQD
237.0000 mL | Freq: Two times a day (BID) | ORAL | Status: DC
  Administered 2017-11-29 – 2017-12-05 (×7): 237 mL via ORAL

## 2017-11-29 NOTE — Progress Notes (Signed)
Physical Therapy Treatment Patient Details Name: Michael [name] MRN: [id number] DOB: 07-29-1930 Today's Date: 11/29/2017    History of Present Illness Pt is a 84 y.o. M with significant PMH of CLL who presents s/p left inguinal hernia repair with mesh and small bowel resection    PT Comments    Patient seen for mobility progression. Pt is making progress toward PT goals and able to increase gait distance. Use of L platform RW this session due to c/o sever L wrist pain. Pt requires mod A for bed mobility/functional transfers and min/mod A +2 for gait training. Continue to progress as tolerated with anticipated d/c to SNF for further skilled PT services.     Follow Up Recommendations  SNF     Equipment Recommendations  None recommended by PT    Recommendations for Other Services       Precautions / Restrictions Precautions Precautions: Fall    Mobility  Bed Mobility Overal bed mobility: Needs Assistance Bed Mobility: Supine to Sit     Supine to sit: Mod assist;HOB elevated     General bed mobility comments: cues for sequencing; assist to elevate trunk into sitting due to L UE pain   Transfers Overall transfer level: Needs assistance Equipment used: Left platform walker Transfers: Sit to/from Stand Sit to Stand: Mod assist         General transfer comment: cues for hand placement and positioning on platform  Ambulation/Gait Ambulation/Gait assistance: Min assist;Mod assist;+2 safety/equipment Gait Distance (Feet): (18ft X 2 trials with seated rest break) Assistive device: Left platform walker Gait Pattern/deviations: Step-through pattern;Decreased stride length;Trunk flexed Gait velocity: decreased   General Gait Details: cues for posture and safe use of AD; assist for balance and guiding RW; seated break due to fatigue   Stairs             Wheelchair Mobility    Modified Rankin (Stroke Patients Only)       Balance Overall balance assessment:  Needs assistance Sitting-balance support: Bilateral upper extremity supported;Feet supported Sitting balance-Leahy Scale: Fair     Standing balance support: Bilateral upper extremity supported;During functional activity Standing balance-Leahy Scale: Poor                              Cognition Arousal/Alertness: Awake/alert Behavior During Therapy: WFL for tasks assessed/performed Overall Cognitive Status: Impaired/Different from baseline                       Memory: Decreased short-term memory                Exercises      General Comments General comments (skin integrity, edema, etc.): geo mat cushion in chair; Spoke to RN about getting pt back to bed after 1 hour in chair       Pertinent Vitals/Pain Pain Assessment: Faces Faces Pain Scale: Hurts whole lot Pain Location: L wrist  Pain Descriptors / Indicators: Grimacing;Moaning Pain Intervention(s): Limited activity within patient's tolerance;Monitored during session;Repositioned    Home Living                      Prior Function            PT Goals (current goals can now be found in the care plan section) Progress towards PT goals: Progressing toward goals    Frequency    Min 2X/week      PT Plan Current  plan remains appropriate    Co-evaluation              AM-PAC PT "6 Clicks" Daily Activity  Outcome Measure  Difficulty turning over in bed (including adjusting bedclothes, sheets and blankets)?: Unable Difficulty moving from lying on back to sitting on the side of the bed? : Unable Difficulty sitting down on and standing up from a chair with arms (e.g., wheelchair, bedside commode, etc,.)?: Unable Help needed moving to and from a bed to chair (including a wheelchair)?: A Little Help needed walking in hospital room?: A Little Help needed climbing 3-5 steps with a railing? : A Lot 6 Click Score: 11    End of Session Equipment Utilized During Treatment: Gait  belt Activity Tolerance: Patient tolerated treatment well Patient left: in chair;with call bell/phone within reach Nurse Communication: Mobility status PT Visit Diagnosis: Unsteadiness on feet (R26.81);Muscle weakness (generalized) (M62.81);Difficulty in walking, not elsewhere classified (R26.2);Pain Pain - part of body: (abdominal)     Time: 4327-6147 PT Time Calculation (min) (ACUTE ONLY): 43 min  Charges:  $Gait Training: 23-37 mins $Therapeutic Activity: 8-22 mins                     Earney Navy, PTA Acute Rehabilitation Services Pager: [phone] Office: [phone]     Darliss Cheney 11/29/2017, 4:41 PM

## 2017-11-29 NOTE — Progress Notes (Signed)
Occupational Therapy Treatment Patient Details Name: Michael [name] MRN: [id number] DOB: [birth year] Today's Date: 11/29/2017    History of present illness Pt is a 84 y.o. M with significant PMH of CLL who presents s/p left inguinal hernia repair with mesh and small bowel resection   OT comments  Pt demonstrates increased ability to complete basic transfer but continues to have L wrist pain. OT to monitor further. Due to patients inability to tolerate any tactile input do not recommend splinting at this time. Attempting a platform on RW next session in attempt to help with stability and decr pain.   Follow Up Recommendations  SNF    Equipment Recommendations  Wheelchair (measurements OT);Wheelchair cushion (measurements OT);Hospital bed;3 in 1 bedside commode    Recommendations for Other Services Other (comment)(palliative medicine)    Precautions / Restrictions Precautions Precautions: Fall       Mobility Bed Mobility               General bed mobility comments: sitting eob on arrival. pt now with air mattress overlay  Transfers Overall transfer level: Needs assistance Equipment used: Rolling walker (2 wheeled) Transfers: Sit to/from Stand Sit to Stand: Mod assist         General transfer comment: cues for hand placement. pt pulling with R UE on RW    Balance Overall balance assessment: Needs assistance Sitting-balance support: Bilateral upper extremity supported;Feet supported Sitting balance-Leahy Scale: Fair     Standing balance support: Bilateral upper extremity supported;During functional activity Standing balance-Leahy Scale: Poor Standing balance comment: pt with lob x1 during session. pt is unable to complete neck rotation at this time. pt holding neck in a stiff medlline                           ADL either performed or assessed with clinical judgement   ADL Overall ADL's : Needs assistance/impaired Eating/Feeding: Set  up Eating/Feeding Details (indicate cue type and reason): drinking with R ue. pt noted to eat 1/4 of a bowl of grits. declined all other food on tray   Grooming Details (indicate cue type and reason): CNA reports bathing this am with staff                 Toilet Transfer: Moderate assistance;RW;BSC           Functional mobility during ADLs: Minimal assistance;Rolling walker General ADL Comments: pt with LOB x1 with mod (A) to correct LOB.      Vision       Perception     Praxis      Cognition Arousal/Alertness: Awake/alert Behavior During Therapy: WFL for tasks assessed/performed Overall Cognitive Status: Impaired/Different from baseline                       Memory: Decreased short-term memory         General Comments: pt grimacing with self range and will state "wait a minute" without therapist with any input. pt moving on his own with verbal cues from therapist        Exercises Other Exercises Other Exercises: shoulder flexion x5 / unable to demonstrate wrist flexion , elbow extension or scapula elevation. pt grimacing and calling out "ohhhh!"   Shoulder Instructions       General Comments pt with wound on buttock at this time so return to air mattress overlay.     Pertinent Vitals/ Pain  Pain Assessment: Faces Faces Pain Scale: Hurts whole lot Pain Location: L wrist elbow and shoulder Pain Descriptors / Indicators: Grimacing Pain Intervention(s): Repositioned;Monitored during session  Home Living                                          Prior Functioning/Environment              Frequency  Min 2X/week        Progress Toward Goals  OT Goals(current goals can now be found in the care plan section)  Progress towards OT goals: Progressing toward goals  Acute Rehab OT Goals Patient Stated Goal: to go home to my own bed OT Goal Formulation: With patient/family Time For Goal Achievement:  12/13/17 Potential to Achieve Goals: Good ADL Goals Pt Will Perform Grooming: with modified independence;sitting;with supervision Pt Will Perform Lower Body Bathing: sit to/from stand;with min assist;with adaptive equipment Pt Will Perform Lower Body Dressing: sit to/from stand;with min assist Pt Will Transfer to Toilet: with supervision;ambulating;bedside commode  Plan Discharge plan needs to be updated    Co-evaluation                 AM-PAC PT "6 Clicks" Daily Activity     Outcome Measure   Help from another person eating meals?: A Little Help from another person taking care of personal grooming?: A Little Help from another person toileting, which includes using toliet, bedpan, or urinal?: A Lot Help from another person bathing (including washing, rinsing, drying)?: A Lot Help from another person to put on and taking off regular upper body clothing?: A Lot Help from another person to put on and taking off regular lower body clothing?: A Lot 6 Click Score: 14    End of Session Equipment Utilized During Treatment: Gait belt;Rolling walker  OT Visit Diagnosis: Unsteadiness on feet (R26.81);Pain;Muscle weakness (generalized) (M62.81);Other symptoms and signs involving cognitive function   Activity Tolerance Patient limited by pain;Patient limited by fatigue   Patient Left in bed;with call bell/phone within reach;with family/visitor present   Nurse Communication Mobility status;Precautions        Time: 1000-1015 OT Time Calculation (min): 15 min  Charges: OT General Charges $OT Visit: 1 Visit OT Treatments $Therapeutic Activity: 8-22 mins   Jeri Modena, OTR/L  Acute Rehabilitation Services Pager: [phone] Office: [phone] .    Parke Poisson B 11/29/2017, 11:36 AM

## 2017-11-29 NOTE — Progress Notes (Signed)
Nutrition Follow-up  DOCUMENTATION CODES:   Not applicable  INTERVENTION:   -TPN management per pharmacy -Ensure Enlive po BID, each supplement provides 350 kcal and 20 grams of protein -D/c Boost Breeze po BID, each supplement provides 250 kcal and 9 grams of protein  NUTRITION DIAGNOSIS:   Inadequate oral intake related to acute illness as evidenced by estimated needs, NPO status.  Progressing; just advanced to full liquids  GOAL:   Patient will meet greater than or equal to 90% of their needs  Met with TPN  MONITOR:   Diet advancement, Labs, Weight trends  REASON FOR ASSESSMENT:   Consult New TPN/TNA  ASSESSMENT:   84 yo male, admitted s/p L inguinal hernia repair. PMH significant for prolonged ileus, CHF, DM, GERD, hemorrhoids, HTN, prostate cancer, seizure disorder.   10/21- s/pProcedure(s): LEFT INGUINAL HERNIA REPAIR WITH MESH (Left) GROIN EXPLORATION WITH REMOVAL OF INGUINAL HERNIA MESH (Left) SMALL BOWEL RESECTION (N/A) 10/28- PICC placed, TPN initiated 10/31- NGT d/c, advanced to clear liquid diet 11/1- CWOCN consult- DPTI to lt buttock evolving into unstageable pressure injury  11/4- advanced to full liquids  Reviewed I/O's: +132 ml x 24 hours and -4 L since admission  Noted 11.6% wt loss from 10/21-10/28.   Spoke with pt and daughter at bedside. Pt reports good tolerance of full liquid diet. He consumed 100% of fruit juice and 1/3 of grits this AM. Per pt, he is sipping on Boost Breeze with medications- noted pt consumed a few sips of most recent supplement. He is in good spirits, but his largest complaint is that he experiences generalized pain anytime his left wrist is touched.   Spoke with pt and daughter regarding nutrition plan of care; discussed rationale for continued TPN, but also importance of good PO intake to promote healing.   Case discussed with RN, who reports pt is making good progress with pt.   Per pharmacy note, due to poor PO  intake, TPN infusing at goal rate of 85 ml/hr given minimal intake, which is providing 1792 kcals and 102 grams protein, meeting 100% of estimated needs.   Labs reviewed: CBGS: 135-153 (inpatient orders for glycemic control are 0-20 units insulin aspart every 4 hours).   Diet Order:   Diet Order            Diet full liquid Room service appropriate? Yes; Fluid consistency: Thin  Diet effective now              EDUCATION NEEDS:   No education needs have been identified at this time  Skin:  Skin Assessment: Skin Integrity Issues: Skin Integrity Issues:: DTI, Incisions DTI: DPTI to lt buttock evolving into unstageable Incisions: lt groin  Last BM:  11/28/17  Height:   Ht Readings from Last 1 Encounters:  11/15/17 5' 5.98" (1.676 m)    Weight:   Wt Readings from Last 1 Encounters:  11/22/17 78.2 kg    Ideal Body Weight:  64.6 kg  BMI:  Body mass index is 27.84 kg/m.  Estimated Nutritional Needs:   Kcal:  7579-7282  Protein:  90-105 grams  Fluid:  > 1.7 L    Rossie Scarfone A. Jimmye Norman, RD, LDN, CDE Pager: [phone] After hours Pager: [phone]

## 2017-11-29 NOTE — Progress Notes (Signed)
14 Days Post-Op   Subjective/Chief Complaint: Currently sleeping Still not taking much po   Objective: Vital signs in last 24 hours: Temp:  [97.4 F (36.3 C)-98 F (36.7 C)] 97.4 F (36.3 C) (11/04 0449) Pulse Rate:  [67-70] 69 (11/04 0449) Resp:  [17] 17 (11/04 0449) BP: (109-124)/(49-56) 124/56 (11/04 0449) SpO2:  [97 %-99 %] 98 % (11/04 0449) Last BM Date: 11/28/17  Intake/Output from previous day: 11/03 0701 - 11/04 0700 In: 740.2 [P.O.:240; I.V.:500.2] Out: 1200 [Urine:1200] Intake/Output this shift: Total I/O In: 545 [P.O.:120; I.V.:425] Out: 750 [Urine:750]  Exam: Lungs clear Abdomen soft, incision clean  Lab Results:  Recent Labs    11/29/17 0328  WBC 13.1*  HGB 7.5*  HCT 24.9*  PLT 414*   BMET Recent Labs    11/28/17 0319 11/29/17 0328  NA 142 141  K 4.1 3.9  CL 115* 111  CO2 21* 21*  GLUCOSE 204* 151*  BUN 27* 28*  CREATININE 0.87 0.79  CALCIUM 8.3* 8.1*   PT/INR No results for input(s): LABPROT, INR in the last 72 hours. ABG No results for input(s): PHART, HCO3 in the last 72 hours.  Invalid input(s): PCO2, PO2  Studies/Results: No results found.  Anti-infectives: Anti-infectives (From admission, onward)   Start     Dose/Rate Route Frequency Ordered Stop   11/15/17 1400  piperacillin-tazobactam (ZOSYN) IVPB 3.375 g  Status:  Discontinued     3.375 g 12.5 mL/hr over 240 Minutes Intravenous Every 8 hours 11/15/17 0718 11/26/17 0821   11/15/17 0800  piperacillin-tazobactam (ZOSYN) IVPB 3.375 g     3.375 g 100 mL/hr over 30 Minutes Intravenous  Once 11/15/17 0718 11/15/17 0923   11/10/17 1210  ceFAZolin (ANCEF) 2-4 GM/100ML-% IVPB    Note to Pharmacy:  [name], [place]   : cabinet override      11/10/17 1210 11/10/17 1451   11/10/17 1200  ceFAZolin (ANCEF) IVPB 2g/100 mL premix     2 g 200 mL/hr over 30 Minutes Intravenous On call to O.R. 11/10/17 1158 11/10/17 1501      Assessment/Plan: s/p Procedure(s): LEFT INGUINAL HERNIA  REPAIR WITH MESH (Left) GROIN EXPLORATION WITH REMOVAL OF INGUINAL HERNIA MESH (Left) SMALL BOWEL RESECTION (N/A)  Malnutrition --encourage po.  Will need to ween TNA soon Anemia of chronic disease --will follow D/c skin staples today PT  LOS: 15 days    [name] 11/29/2017

## 2017-11-29 NOTE — Social Work (Addendum)
CSW spoke with pt daughter [name], she requested CSW return call in another hour or two as she is currently at an appointment.  CSW will follow up for facility choice to initiate insurance authorization.   1:05pm- spoke with pt daughter [name], she has chosen [facility]. Will f/u with Isaias Cowman to start insurance authorization when pt begins weaning off of TPN.   Westley Hummer, MSW, [place] Work [phone]

## 2017-11-29 NOTE — Progress Notes (Signed)
PHARMACY - ADULT TOTAL PARENTERAL NUTRITION CONSULT NOTE   Pharmacy Consult:  TPN Indication: Prolonged Ileus  Patient Measurements: Height: 5' 5.98" (167.6 cm) Weight: 172 lb 6.4 oz (78.2 kg) IBW/kg (Calculated) : 63.76 TPN AdjBW (KG): 69.9 Body mass index is 27.84 kg/m.  Assessment:  71 YOM presents on 10/15 with a L inguinal hernia. Surgery took to OR to repair on 10/16. Unfortunately he developed a hematoma at the operative site. Was then to be discharged on 10/18 but his wound started draining seroma fluid so kept inpatient. Then developed N/V and found to have an ileus. Has been getting conservative management but now has been over a week out from initial surgery and not tolerating CLD. Pharmacy consulted to manage TPN for prolonged ileus.  GI: prealbumin trending up to 19.5 (wnl). Diet - full liquid with Boost supplements, Pt still not taking much po  Endo: DM - CBGs 150-216 (<180 since new TPN started with increase insulin ~1800 last night) Insulin requirements in the past 24 hours: 29 units SSI + 50 units insulin in TPN Lytes: low CO2, others WNL Renal: SCr down 0.79, BUN 28 - UOP not charted accurately - Flomax Pulm: RA Cards: VSS Hepatobil: LFTs / tbili wnl, TG up to 152  Onc: ibrutinib for CLL - Hgb down to 7.5. plt up to 414 Neuro: scheduled APAP, PRN Dilaudid/oxy/Benadryl. Pain score 5-7 ID: s/p Zosyn for wound infxn - afebrile, WBC 13.1 TPN Access: PICC 11/22/17 TPN start date: 11/22/17  Nutritional Goals (per RD rec on 10/31): 1750-1950 kcal, 90-105 g, >1.7 L fluid per day  Current Nutrition:  Full liquid diet started 11/4 Boost tid started 11/1 (each will provide 250 kcal, 9g protein) - charted but only drinking several sips TPN  Plan:  Continue TPN at goal rate of 85 ml/hr given minimal intake. TPN will provide 102g AA, 245g CHO and 55g ILE for a total of 1792 kCal, meeting 100% of patient needs.  Electrolytes in TPN: zero Na, max acetate MVI, trace  elements, and famotidine 20mg  in TPN Continue resistant SSI Q4H + increase regular insulin in TPN to 60 units F/U AM labs, CBGs, PO intake and diet advancement in order to wean TPN  Sherlon Handing, PharmD, BCPS Clinical pharmacist  **Pharmacist phone directory can now be found on [email or web address] ([email or web address]).  Listed under [place]. 11/29/2017, 9:42 AM

## 2017-11-29 NOTE — Plan of Care (Signed)
  Problem: Education: Goal: Knowledge of General Education information will improve Description: Including pain rating scale, medication(s)/side effects and non-pharmacologic comfort measures Outcome: Progressing   Problem: Activity: Goal: Risk for activity intolerance will decrease Outcome: Progressing   Problem: Nutrition: Goal: Adequate nutrition will be maintained  Outcome: Not Progressing 

## 2017-11-30 ENCOUNTER — Other Ambulatory Visit: Payer: Medicare HMO

## 2017-11-30 ENCOUNTER — Ambulatory Visit: Payer: Medicare HMO | Admitting: Hematology and Oncology

## 2017-11-30 ENCOUNTER — Inpatient Hospital Stay: Payer: Medicare HMO | Attending: Hematology and Oncology

## 2017-11-30 ENCOUNTER — Inpatient Hospital Stay: Payer: Medicare HMO | Admitting: Nurse Practitioner

## 2017-11-30 LAB — GLUCOSE, CAPILLARY
GLUCOSE-CAPILLARY: 101 mg/dL — AB (ref 70–99)
Glucose-Capillary: 110 mg/dL — ABNORMAL HIGH (ref 70–99)
Glucose-Capillary: 112 mg/dL — ABNORMAL HIGH (ref 70–99)
Glucose-Capillary: 130 mg/dL — ABNORMAL HIGH (ref 70–99)
Glucose-Capillary: 178 mg/dL — ABNORMAL HIGH (ref 70–99)
Glucose-Capillary: 182 mg/dL — ABNORMAL HIGH (ref 70–99)
Glucose-Capillary: 196 mg/dL — ABNORMAL HIGH (ref 70–99)

## 2017-11-30 MED ORDER — INSULIN ASPART 100 UNIT/ML ~~LOC~~ SOLN
0.0000 [IU] | Freq: Every day | SUBCUTANEOUS | Status: DC
  Administered 2017-12-02: 2 [IU] via SUBCUTANEOUS
  Administered 2017-12-03: 0 [IU] via SUBCUTANEOUS

## 2017-11-30 MED ORDER — INSULIN ASPART 100 UNIT/ML ~~LOC~~ SOLN
0.0000 [IU] | Freq: Three times a day (TID) | SUBCUTANEOUS | Status: DC
  Administered 2017-11-30 – 2017-12-01 (×4): 3 [IU] via SUBCUTANEOUS
  Administered 2017-12-01 – 2017-12-02 (×2): 5 [IU] via SUBCUTANEOUS
  Administered 2017-12-02: 8 [IU] via SUBCUTANEOUS
  Administered 2017-12-02: 2 [IU] via SUBCUTANEOUS
  Administered 2017-12-03 (×2): 5 [IU] via SUBCUTANEOUS
  Administered 2017-12-03: 3 [IU] via SUBCUTANEOUS
  Administered 2017-12-04 (×2): 5 [IU] via SUBCUTANEOUS
  Administered 2017-12-04: 3 [IU] via SUBCUTANEOUS

## 2017-11-30 MED ORDER — WHITE PETROLATUM EX OINT
TOPICAL_OINTMENT | CUTANEOUS | Status: AC
  Filled 2017-11-30: qty 28.35

## 2017-11-30 MED ORDER — FAMOTIDINE 20 MG PO TABS
20.0000 mg | ORAL_TABLET | Freq: Every day | ORAL | Status: DC
  Administered 2017-11-30 – 2017-12-05 (×6): 20 mg via ORAL
  Filled 2017-11-30: qty 2
  Filled 2017-11-30 (×5): qty 1

## 2017-11-30 NOTE — Progress Notes (Signed)
15 Days Post-Op   Subjective/Chief Complaint: Pt increased activity with PT/OT yesterday Increased po intake   Objective: Vital signs in last 24 hours: Temp:  [97.4 F (36.3 C)-97.8 F (36.6 C)] 97.6 F (36.4 C) (11/05 0437) Pulse Rate:  [70-72] 72 (11/05 0437) Resp:  [16-17] 17 (11/05 0437) BP: (130-134)/(52-56) 130/56 (11/05 0437) SpO2:  [98 %-99 %] 99 % (11/05 0437) Last BM Date: 11/29/17  Intake/Output from previous day: 11/04 0701 - 11/05 0700 In: 3120.4 [P.O.:1014; I.V.:2106.4] Out: 1350 [Urine:1350] Intake/Output this shift: No intake/output data recorded.  Exam: Abdomen still tender Lunges clear  Lab Results:  Recent Labs    11/29/17 0328  WBC 13.1*  HGB 7.5*  HCT 24.9*  PLT 414*   BMET Recent Labs    11/28/17 0319 11/29/17 0328  NA 142 141  K 4.1 3.9  CL 115* 111  CO2 21* 21*  GLUCOSE 204* 151*  BUN 27* 28*  CREATININE 0.87 0.79  CALCIUM 8.3* 8.1*   PT/INR No results for input(s): LABPROT, INR in the last 72 hours. ABG No results for input(s): PHART, HCO3 in the last 72 hours.  Invalid input(s): PCO2, PO2  Studies/Results: No results found.  Anti-infectives: Anti-infectives (From admission, onward)   Start     Dose/Rate Route Frequency Ordered Stop   11/15/17 1400  piperacillin-tazobactam (ZOSYN) IVPB 3.375 g  Status:  Discontinued     3.375 g 12.5 mL/hr over 240 Minutes Intravenous Every 8 hours 11/15/17 0718 11/26/17 0821   11/15/17 0800  piperacillin-tazobactam (ZOSYN) IVPB 3.375 g     3.375 g 100 mL/hr over 30 Minutes Intravenous  Once 11/15/17 0718 11/15/17 0923   11/10/17 1210  ceFAZolin (ANCEF) 2-4 GM/100ML-% IVPB    Note to Pharmacy:  [name], [place]   : cabinet override      11/10/17 1210 11/10/17 1451   11/10/17 1200  ceFAZolin (ANCEF) IVPB 2g/100 mL premix     2 g 200 mL/hr over 30 Minutes Intravenous On call to O.R. 11/10/17 1158 11/10/17 1501      Assessment/Plan: s/p Procedure(s): LEFT INGUINAL HERNIA REPAIR WITH  MESH (Left) GROIN EXPLORATION WITH REMOVAL OF INGUINAL HERNIA MESH (Left) SMALL BOWEL RESECTION (N/A)  Encourage PO.  Will have to stop TNA in anticipation of SNF later this week if he continues to progress.  LOS: 16 days    Kay Ricciuti A 11/30/2017

## 2017-11-30 NOTE — Progress Notes (Signed)
PHARMACY - ADULT TOTAL PARENTERAL NUTRITION CONSULT NOTE   Pharmacy Consult:  TPN (d/c 11/5) Indication: Prolonged Ileus  Patient Measurements: Height: 5' 5.98" (167.6 cm) Weight: 172 lb 6.4 oz (78.2 kg) IBW/kg (Calculated) : 63.76 TPN AdjBW (KG): 69.9 Body mass index is 27.84 kg/m.  Assessment:  Michael [name] presents on 10/15 with a L inguinal hernia. Surgery took to OR to repair on 10/16. Unfortunately he developed a hematoma at the operative site. Was then to be discharged on 10/18 but his wound started draining seroma fluid so kept inpatient. Then developed N/V and found to have an ileus.  Pharmacy consulted to manage TPN for prolonged ileus.  GI: prealbumin trending up to 19.5 (wnl). Diet - full liquid with Ensure Enlive supplements, Pt taking more po - ~25% of trays + 1-2 Ensure Enlive supplements per day. Noted plan to stop TPN prior to SNF transfer later this week. Endo: DM - CBGs 110-237; most <180. Insulin requirements in the past 24 hours: 21 units SSI + 60 units insulin in TPN Lytes: No labs today. low CO2, others WNL Renal: SCr down 0.79, BUN 28 - UOP 0.7 ml/kg/hr -likely not all charted - Flomax Pulm: RA Cards: VSS Hepatobil: LFTs / tbili wnl, TG up to 152  Onc: ibrutinib for CLL - Hgb down to 7.5. plt up to 414 Neuro: scheduled APAP, PRN Dilaudid/oxy/Benadryl. Pain score 5-7 ID: s/p Zosyn for wound infxn - afebrile, WBC 13.1 TPN Access: PICC 11/22/17 TPN start date: 11/22/17  Nutritional Goals (per RD rec on 10/31): 1750-1950 kcal, 90-105 g, >1.7 L fluid per day  Current Nutrition:  Full liquid diet + Ensure Enlive BID TPN  Plan:  MD d/c TPN this morning at [place] Will d/c TPN labs Change famotidine to po Change SSI and CBGs to [facility] and at bedtime and change SSI to moderate scale as pt still not eating great   Sherlon Handing, PharmD, BCPS Clinical pharmacist  **Pharmacist phone directory can now be found on [email or web address] ([email or web address]).  Listed under [place]. 11/30/2017, 9:48 AM

## 2017-11-30 NOTE — Consult Note (Signed)
[place] Nurse wound follow up Left buttock wound evaluated in [place] with the assistance of the patient's NT.  Patient requested that his two male visitors step out, which they did. Wound type: Unstageable PI Measurement: 6 cm x 7 cm Wound bed: 100% yellow/brown slough with a foam dressing over the wound.  Dressing changed and Santyl applied with saline moistened gauze and ABD pad. Drainage (amount, consistency, odor) no drainage on foam dressing. Periwound: intact skin, normal color and texture. Dressing procedure/placement/frequency: Will change therapy to thin hydrocolloid ([employer], [place] [address]) as patient is incontinent of loose stools. Monitor the wound area(s) for worsening of condition such as: Signs/symptoms of infection,  Increase in size,  Development of or worsening of odor, Development of pain, or increased pain at the affected locations.  Notify the medical team if any of these develop.  Thank you for the consult.  Discussed plan of care with the patient and bedside nurse.  [place] nurse will not follow at this time.  Please re-consult the [place] team if needed.  Val Riles, RN, MSN, CWOCN, CNS-BC, pager [phone]

## 2017-11-30 NOTE — Social Work (Signed)
CSW has initiated insurance authorization through [employer] for [facility] SNF in anticipation of TPN wean.  Westley Hummer, MSW, [place] Work [phone]

## 2017-12-01 LAB — GLUCOSE, CAPILLARY
GLUCOSE-CAPILLARY: 199 mg/dL — AB (ref 70–99)
GLUCOSE-CAPILLARY: 237 mg/dL — AB (ref 70–99)
GLUCOSE-CAPILLARY: 169 mg/dL — AB (ref 70–99)
Glucose-Capillary: 162 mg/dL — ABNORMAL HIGH (ref 70–99)

## 2017-12-01 MED ORDER — ADULT MULTIVITAMIN W/MINERALS CH
1.0000 | ORAL_TABLET | Freq: Every day | ORAL | Status: DC
  Administered 2017-12-01 – 2017-12-05 (×5): 1 via ORAL
  Filled 2017-12-01 (×5): qty 1

## 2017-12-01 NOTE — Progress Notes (Signed)
Nutrition Follow-up  DOCUMENTATION CODES:   Not applicable  INTERVENTION:   -Continue Ensure Enlive po BID, each supplement provides 350 kcal and 20 grams of protein -MVI with minerals daily   NUTRITION DIAGNOSIS:   Increased nutrient needs related to wound healing, post-op healing as evidenced by estimated needs.  Ongoing  GOAL:   Patient will meet greater than or equal to 90% of their needs  Progressing  MONITOR:   PO intake, Supplement acceptance, Labs, Weight trends, Skin, I & O's  REASON FOR ASSESSMENT:   Consult New TPN/TNA  ASSESSMENT:   84 yo male, admitted s/p L inguinal hernia repair. PMH significant for prolonged ileus, CHF, DM, GERD, hemorrhoids, HTN, prostate cancer, seizure disorder.   10/21-s/pProcedure(s): LEFT INGUINAL HERNIA REPAIR WITH MESH (Left) GROIN EXPLORATION WITH REMOVAL OF INGUINAL HERNIA MESH (Left) SMALL BOWEL RESECTION (N/A) 10/28- PICC placed, TPN initiated 10/31- NGT d/c, advanced to clear liquid diet 11/1- CWOCN consult- DPTI to lt buttock evolving into unstageable pressure injury  11/4- advanced to full liquids 11/5- TPN d/c 11/6- advanced to soft diet  Pt in good spirits, working with physical therapy when RD entered unit. When RD went back later to visit pt in room, he was receiving nursing care. Pt consumed 100% of full liquid tray this AM and RN had just offered pt Ensure supplement (which he has been consuming).   Per general surgery notes, plan for possible discharge to SNF on Friday, 12/03/17.  Labs reviewed: CBGS: 112-199 (inpatient orders for glycemic control are 0-15 units insulin aspart TID with meals and 0-5 units insulin aspart q HS  Diet Order:   Diet Order            Diet full liquid Room service appropriate? Yes; Fluid consistency: Thin  Diet effective now              EDUCATION NEEDS:   Education needs have been addressed  Skin:  Skin Assessment: Skin Integrity Issues: Skin Integrity Issues:: DTI,  Incisions DTI: DPTI to lt buttock evolving into unstageable Incisions: lt groin  Last BM:  11/30/17  Height:   Ht Readings from Last 1 Encounters:  11/15/17 5' 5.98" (1.676 m)    Weight:   Wt Readings from Last 1 Encounters:  11/22/17 78.2 kg    Ideal Body Weight:  64.6 kg  BMI:  Body mass index is 27.84 kg/m.  Estimated Nutritional Needs:   Kcal:  7616-0737  Protein:  90-105 grams  Fluid:  > 1.7 L    Suhail Peloquin A. Jimmye Norman, RD, LDN, CDE Pager: [phone] After hours Pager: [phone]

## 2017-12-01 NOTE — Progress Notes (Signed)
Physical Therapy Treatment Patient Details Name: Michael [name] MRN: [id number] DOB: May 09, 1930 Today's Date: 12/01/2017    History of Present Illness Pt is a 84 y.o. M with significant PMH of CLL who presents s/p left inguinal hernia repair with mesh and small bowel resection    PT Comments    Patient continues to make progress toward PT goals and eager to participate in therapy. Pt continues to require min/mod A for mobility and c/o L wrist pain with limited ROM. L platform RW continues to be best option given limited weight bearing through L wrist. Continue to progress as tolerated with anticipated d/c to SNF for further skilled PT services.     Follow Up Recommendations  SNF     Equipment Recommendations  None recommended by PT    Recommendations for Other Services       Precautions / Restrictions Precautions Precautions: Fall    Mobility  Bed Mobility               General bed mobility comments: pt sitting EOB with nursing staff present upon arrival  Transfers Overall transfer level: Needs assistance Equipment used: Left platform walker Transfers: Sit to/from Stand Sit to Stand: Mod assist         General transfer comment: cues for hand placement and positioning prior to standing; mod A to power up into standing   Ambulation/Gait Ambulation/Gait assistance: Min assist;Mod assist;+2 safety/equipment Gait Distance (Feet): (71ft then 16ft with seated rest break) Assistive device: Left platform walker Gait Pattern/deviations: Step-through pattern;Decreased stride length;Trunk flexed Gait velocity: decreased   General Gait Details: pt fatigues quickly and seated rest break required; multimodal cues for posture and vc for increased stride length; assist for balance, especially when turning, and to guide RW   Stairs             Wheelchair Mobility    Modified Rankin (Stroke Patients Only)       Balance Overall balance assessment: Needs  assistance Sitting-balance support: Feet supported Sitting balance-Leahy Scale: Fair     Standing balance support: Bilateral upper extremity supported;During functional activity Standing balance-Leahy Scale: Poor                              Cognition Arousal/Alertness: Awake/alert Behavior During Therapy: WFL for tasks assessed/performed Overall Cognitive Status: Within Functional Limits for tasks assessed                                        Exercises      General Comments        Pertinent Vitals/Pain Pain Assessment: Faces Faces Pain Scale: Hurts whole lot Pain Location: L wrist  Pain Descriptors / Indicators: Grimacing;Moaning;Guarding Pain Intervention(s): Limited activity within patient's tolerance;Monitored during session;Repositioned    Home Living                      Prior Function            PT Goals (current goals can now be found in the care plan section) Progress towards PT goals: Progressing toward goals    Frequency    Min 2X/week      PT Plan Current plan remains appropriate    Co-evaluation              AM-PAC PT "6 Clicks" Daily Activity  Outcome Measure  Difficulty turning over in bed (including adjusting bedclothes, sheets and blankets)?: Unable Difficulty moving from lying on back to sitting on the side of the bed? : Unable Difficulty sitting down on and standing up from a chair with arms (e.g., wheelchair, bedside commode, etc,.)?: Unable Help needed moving to and from a bed to chair (including a wheelchair)?: A Little Help needed walking in hospital room?: A Little Help needed climbing 3-5 steps with a railing? : A Lot 6 Click Score: 11    End of Session Equipment Utilized During Treatment: Gait belt Activity Tolerance: Patient tolerated treatment well Patient left: in chair;with call bell/phone within reach;with family/visitor present Nurse Communication: Mobility status PT Visit  Diagnosis: Unsteadiness on feet (R26.81);Muscle weakness (generalized) (M62.81);Difficulty in walking, not elsewhere classified (R26.2);Pain Pain - part of body: (abdominal)     Time: 5208-0223 PT Time Calculation (min) (ACUTE ONLY): 23 min  Charges:  $Gait Training: 8-22 mins $Therapeutic Activity: 8-22 mins                     Earney Navy, PTA Acute Rehabilitation Services Pager: [phone] Office: [phone]     Darliss Cheney 12/01/2017, 4:12 PM

## 2017-12-01 NOTE — Discharge Instructions (Addendum)
Ok to shower /bathe   Ice pack, tylenol, also for pain  No lifting over 15 pounds for 2 more weeks  Wear supportive underwear  [facility] _______[facility]  UMBILICAL OR INGUINAL HERNIA REPAIR: POST OP INSTRUCTIONS  Always review your discharge instruction sheet given to you by the facility where your surgery was performed. IF YOU HAVE DISABILITY OR FAMILY LEAVE FORMS, YOU MUST BRING THEM TO THE OFFICE FOR PROCESSING.   DO NOT GIVE THEM TO YOUR DOCTOR.  1. A  prescription for pain medication may be given to you upon discharge.  Take your pain medication as prescribed, if needed.  If narcotic pain medicine is not needed, then you may take acetaminophen (Tylenol) or ibuprofen (Advil) as needed. 2. Take your usually prescribed medications unless otherwise directed. If you need a refill on your pain medication, please contact your pharmacy.  They will contact our office to request authorization. Prescriptions will not be filled after 5 pm or on week-ends. 3. You should follow a light diet the first 24 hours after arrival home, such as soup and crackers, etc.  Be sure to include lots of fluids daily.  Resume your normal diet the day after surgery. 4.Most patients will experience some swelling and bruising around the umbilicus or in the groin and scrotum.  Ice packs and reclining will help.  Swelling and bruising can take several days to resolve.  6. It is common to experience some constipation if taking pain medication after surgery.  Increasing fluid intake and taking a stool softener (such as Colace) will usually help or prevent this problem from occurring.  A mild laxative (Milk of Magnesia or Miralax) should be taken according to package directions if there are no bowel movements after 48 hours. 7. Unless discharge instructions indicate otherwise, you may remove your bandages 24-48 hours after surgery, and you may shower at that time.  You may have steri-strips (small skin tapes) in  place directly over the incision.  These strips should be left on the skin for 7-10 days.  If your surgeon used skin glue on the incision, you may shower in 24 hours.  The glue will flake off over the next 2-3 weeks.  Any sutures or staples will be removed at the office during your follow-up visit. 8. ACTIVITIES:  You may resume regular (light) daily activities beginning the next day--such as daily self-care, walking, climbing stairs--gradually increasing activities as tolerated.  You may have sexual intercourse when it is comfortable.  Refrain from any heavy lifting or straining until approved by your doctor.  a.You may drive when you are no longer taking prescription pain medication, you can comfortably wear a seatbelt, and you can safely maneuver your car and apply brakes. b.RETURN TO WORK:   _____________________________________________  9.You should see your doctor in the office for a follow-up appointment approximately 2-3 weeks after your surgery.  Make sure that you call for this appointment within a day or two after you arrive home to insure a convenient appointment time. 10.OTHER INSTRUCTIONS: _________________________    _____________________________________  WHEN TO CALL YOUR DOCTOR: 1. Fever over 101.0 2. Inability to urinate 3. Nausea and/or vomiting 4. Extreme swelling or bruising 5. Continued bleeding from incision. 6. Increased pain, redness, or drainage from the incision  The clinic staff is available to answer your questions during regular business hours.  Please dont hesitate to call and ask to speak to one of the nurses for clinical concerns.  If you have a  medical emergency, go to the nearest emergency room or call 911.  A surgeon from [facility] is always on call at the hospital   [address], [place], [place], [place]  [address] ?  [address] [place], [place], [place]   [address] [phone] ? [phone] ? FAX [phone] Web site:  [email or web address]  [facility] Stay Proper nutrition can help your body recover from illness and injury.   Foods and beverages high in protein, vitamins, and minerals help rebuild muscle loss, promote healing, & reduce fall risk.   In addition to eating healthy foods, a nutrition shake is an easy, delicious way to get the nutrition you need during and after your hospital stay  It is recommended that you continue to drink 2 bottles per day of:       Ensure Plus  for at least 1 month (30 days) after your hospital stay   Tips for adding a nutrition shake into your routine: As allowed, drink one with vitamins or medications instead of water or juice Enjoy one as a tasty mid-morning or afternoon snack Drink cold or make a milkshake out of it Drink one instead of milk with cereal or snacks Use as a coffee creamer   Available at the following grocery stores and pharmacies:           * [employer] [phone]            For COUPONS visit: www.ensure.com/join or http://dawson-may.com/   Suggested Substitutions Ensure Plus = Boost Plus = Carnation Breakfast Essentials = Boost Compact Ensure Active Clear = Boost Breeze Glucerna Shake = Boost Glucose Control = Carnation Breakfast Essentials SUGAR FREE

## 2017-12-01 NOTE — Progress Notes (Signed)
16 Days Post-Op   Subjective/Chief Complaint: No complaints Tolerating full liquids   Objective: Vital signs in last 24 hours: Temp:  [98.3 F (36.8 C)-99 F (37.2 C)] 98.3 F (36.8 C) (11/06 0432) Pulse Rate:  [63-75] 63 (11/06 0432) Resp:  [17-18] 18 (11/06 0432) BP: (95-130)/(33-52) 100/40 (11/06 0432) SpO2:  [95 %-100 %] 100 % (11/06 0432) Last BM Date: 11/30/17  Intake/Output from previous day: 11/05 0701 - 11/06 0700 In: 710 [P.O.:710] Out: 1200 [Urine:1200] Intake/Output this shift: No intake/output data recorded.  Exam: Much more alert and comfortable looking today Abdomen softer, less tender  Lab Results:  Recent Labs    11/29/17 0328  WBC 13.1*  HGB 7.5*  HCT 24.9*  PLT 414*   BMET Recent Labs    11/29/17 0328  NA 141  K 3.9  CL 111  CO2 21*  GLUCOSE 151*  BUN 28*  CREATININE 0.79  CALCIUM 8.1*   PT/INR No results for input(s): LABPROT, INR in the last 72 hours. ABG No results for input(s): PHART, HCO3 in the last 72 hours.  Invalid input(s): PCO2, PO2  Studies/Results: No results found.  Anti-infectives: Anti-infectives (From admission, onward)   Start     Dose/Rate Route Frequency Ordered Stop   11/15/17 1400  piperacillin-tazobactam (ZOSYN) IVPB 3.375 g  Status:  Discontinued     3.375 g 12.5 mL/hr over 240 Minutes Intravenous Every 8 hours 11/15/17 0718 11/26/17 0821   11/15/17 0800  piperacillin-tazobactam (ZOSYN) IVPB 3.375 g     3.375 g 100 mL/hr over 30 Minutes Intravenous  Once 11/15/17 0718 11/15/17 0923   11/10/17 1210  ceFAZolin (ANCEF) 2-4 GM/100ML-% IVPB    Note to Pharmacy:  [name], [place]   : cabinet override      11/10/17 1210 11/10/17 1451   11/10/17 1200  ceFAZolin (ANCEF) IVPB 2g/100 mL premix     2 g 200 mL/hr over 30 Minutes Intravenous On call to O.R. 11/10/17 1158 11/10/17 1501      Assessment/Plan: s/p Procedure(s): LEFT INGUINAL HERNIA REPAIR WITH MESH (Left) GROIN EXPLORATION WITH REMOVAL OF  INGUINAL HERNIA MESH (Left) SMALL BOWEL RESECTION (N/A)  Advance to a soft diet Continue working with PT/OT Anticipate being able to go to a SNF on Friday if he continues to do well and there is a bed available  LOS: 17 days    Serena Petterson A 12/01/2017

## 2017-12-02 LAB — GLUCOSE, CAPILLARY
GLUCOSE-CAPILLARY: 258 mg/dL — AB (ref 70–99)
GLUCOSE-CAPILLARY: 219 mg/dL — AB (ref 70–99)
Glucose-Capillary: 204 mg/dL — ABNORMAL HIGH (ref 70–99)
Glucose-Capillary: 150 mg/dL — ABNORMAL HIGH (ref 70–99)

## 2017-12-02 NOTE — Progress Notes (Signed)
Physical Therapy Treatment Patient Details Name: Michael [name] MRN: [id number] DOB: Mar 11, 1930 Today's Date: 12/02/2017    History of Present Illness Pt is a 84 y.o. M with significant PMH of CLL who presents s/p left inguinal hernia repair with mesh and small bowel resection    PT Comments    Patient seen for mobility progression. Pt continues to make gradual progress toward PT goals. Pt requires mod A for functional transfers and min/mod A (+2 for chair follow) for gait training. Pt will continue to benefit from further skilled PT services both acute and post acute to maximize independence and safety with mobility.    Follow Up Recommendations  SNF     Equipment Recommendations  None recommended by PT    Recommendations for Other Services       Precautions / Restrictions Precautions Precautions: Fall    Mobility  Bed Mobility               General bed mobility comments: pt OOB in chair upon arrival  Transfers Overall transfer level: Needs assistance Equipment used: Left platform walker Transfers: Sit to/from Stand Sit to Stand: Mod assist         General transfer comment: assistance required to power up into standing; cues for safe hand placement and positioning  Ambulation/Gait Ambulation/Gait assistance: Min assist;Mod assist;+2 safety/equipment Gait Distance (Feet): (75 ft X2 trials with seated rest break required) Assistive device: Left platform walker Gait Pattern/deviations: Step-through pattern;Decreased stride length;Trunk flexed Gait velocity: decreased   General Gait Details: cues for increased stride length and cadence; multimodal cues for posture/hip extension   Stairs             Wheelchair Mobility    Modified Rankin (Stroke Patients Only)       Balance Overall balance assessment: Needs assistance Sitting-balance support: Feet supported Sitting balance-Leahy Scale: Fair     Standing balance support: Bilateral upper  extremity supported;During functional activity Standing balance-Leahy Scale: Poor                              Cognition Arousal/Alertness: Awake/alert Behavior During Therapy: WFL for tasks assessed/performed Overall Cognitive Status: Within Functional Limits for tasks assessed                                        Exercises      General Comments        Pertinent Vitals/Pain Pain Assessment: Faces Faces Pain Scale: Hurts even more Pain Location: L wrist  Pain Descriptors / Indicators: Grimacing;Guarding;Sore;Tender Pain Intervention(s): Limited activity within patient's tolerance;Monitored during session;Repositioned    Home Living                      Prior Function            PT Goals (current goals can now be found in the care plan section) Progress towards PT goals: Progressing toward goals    Frequency    Min 2X/week      PT Plan Current plan remains appropriate    Co-evaluation              AM-PAC PT "6 Clicks" Daily Activity  Outcome Measure  Difficulty turning over in bed (including adjusting bedclothes, sheets and blankets)?: Unable Difficulty moving from lying on back to sitting on the side of the  bed? : Unable Difficulty sitting down on and standing up from a chair with arms (e.g., wheelchair, bedside commode, etc,.)?: Unable Help needed moving to and from a bed to chair (including a wheelchair)?: A Little Help needed walking in hospital room?: A Little Help needed climbing 3-5 steps with a railing? : A Lot 6 Click Score: 11    End of Session Equipment Utilized During Treatment: Gait belt Activity Tolerance: Patient tolerated treatment well Patient left: in chair;with call bell/phone within reach;with family/visitor present Nurse Communication: Mobility status PT Visit Diagnosis: Unsteadiness on feet (R26.81);Muscle weakness (generalized) (M62.81);Difficulty in walking, not elsewhere classified  (R26.2);Pain Pain - part of body: (abdominal)     Time: 2423-5361 PT Time Calculation (min) (ACUTE ONLY): 38 min  Charges:  $Gait Training: 23-37 mins $Therapeutic Activity: 8-22 mins                     Earney Navy, PTA Acute Rehabilitation Services Pager: [phone] Office: [phone]     Darliss Cheney 12/02/2017, 12:14 PM

## 2017-12-02 NOTE — Progress Notes (Signed)
Miquel Dunn does not have insurance approval yet. Will continue to follow.   [name] [name] Kristina Bertone LCSW [phone]

## 2017-12-02 NOTE — Care Management Note (Signed)
Case Management Note  Patient Details  Name: Michael [name] MRN: [id number] Date of Birth: 14-Jul-1930  Subjective/Objective:                    Action/Plan:  Social work to see today regarding disposition to SNF tomorrow  Expected Discharge Date:  11/12/17               Expected Discharge Plan:  [place]  In-House Referral:  Clinical Social Work  Discharge planning Services  CM Consult  Post Acute Care Choice:  NA Choice offered to:     DME Arranged:  N/A DME Agency:  NA  HH Arranged:  NA HH Agency:  NA  Status of Service:  In process, will continue to follow  If discussed at Long Length of Stay Meetings, dates discussed:    Additional Comments:  Marilu Favre, RN 12/02/2017, 11:20 AM

## 2017-12-02 NOTE — Progress Notes (Signed)
17 Days Post-Op   Subjective/Chief Complaint: Still with chronic pain in his extremities No abdominal complaints Tolerating po and having BM's   Objective: Vital signs in last 24 hours: Temp:  [97.8 F (36.6 C)-98.3 F (36.8 C)] 98.3 F (36.8 C) (11/07 0544) Pulse Rate:  [61-68] 62 (11/07 0544) Resp:  [14-20] 14 (11/07 0544) BP: (102-114)/(44-56) 112/44 (11/07 0544) SpO2:  [98 %-100 %] 100 % (11/07 0544) Last BM Date: 11/30/17  Intake/Output from previous day: 11/06 0701 - 11/07 0700 In: 540 [P.O.:540] Out: 1200 [Urine:1200] Intake/Output this shift: No intake/output data recorded.  Exam: Awake and alert Abdomen soft, non-distended  Lab Results:  No results for input(s): WBC, HGB, HCT, PLT in the last 72 hours. BMET No results for input(s): NA, K, CL, CO2, GLUCOSE, BUN, CREATININE, CALCIUM in the last 72 hours. PT/INR No results for input(s): LABPROT, INR in the last 72 hours. ABG No results for input(s): PHART, HCO3 in the last 72 hours.  Invalid input(s): PCO2, PO2  Studies/Results: No results found.  Anti-infectives: Anti-infectives (From admission, onward)   Start     Dose/Rate Route Frequency Ordered Stop   11/15/17 1400  piperacillin-tazobactam (ZOSYN) IVPB 3.375 g  Status:  Discontinued     3.375 g 12.5 mL/hr over 240 Minutes Intravenous Every 8 hours 11/15/17 0718 11/26/17 0821   11/15/17 0800  piperacillin-tazobactam (ZOSYN) IVPB 3.375 g     3.375 g 100 mL/hr over 30 Minutes Intravenous  Once 11/15/17 0718 11/15/17 0923   11/10/17 1210  ceFAZolin (ANCEF) 2-4 GM/100ML-% IVPB    Note to Pharmacy:  [name], [place]   : cabinet override      11/10/17 1210 11/10/17 1451   11/10/17 1200  ceFAZolin (ANCEF) IVPB 2g/100 mL premix     2 g 200 mL/hr over 30 Minutes Intravenous On call to O.R. 11/10/17 1158 11/10/17 1501      Assessment/Plan: s/p Procedure(s): LEFT INGUINAL HERNIA REPAIR WITH MESH (Left) GROIN EXPLORATION WITH REMOVAL OF INGUINAL HERNIA  MESH (Left) SMALL BOWEL RESECTION (N/A)  Improving slowly  He should be ready to go to a SNF by tomorrow if bed somewhere becomes available  LOS: 18 days    Zsofia Prout A 12/02/2017

## 2017-12-02 NOTE — Consult Note (Signed)
[facility] CM Primary Care Navigator  12/02/2017  Michael [name] 08/02/1930 [id number]   Met with patient and daughter Michael [name]) at the bedside to identify possible discharge needs. Patient was referred by his primary care provider for evaluation of a large left inguinal hernia whichhad led to this admission/ surgery. (chronically incarcerated hernia status post left inguinal hernia repair with mesh, developed left inguinal hematoma/seroma post op)  Patient endorses Dr.James Kim with [facility] as his primary care provider.   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  Patient reports that he has been managing his own medications at [facility] use of "pill box" system filled once a week.   Daughter states that patient has been driving prior to admission/ surgery but she will be providing transportation to his doctors'appointments after discharge.  Patientlivesalone but daughter lives close by him, who provides assistance and serves as his primary caregiver at home.  Anticipated discharge plan isskilled nursing facility (SNF) per therapy recommendation.  Patient and daughter voiced understanding to call primary care provider's office whenhe returns back home, for a post discharge follow-up appointment within 1- 2 weeks or sooner if needed.Patient letter (with PCP's contact number) was provided as their reminder.  Explained to patient and daughter about [facility] CM services available for healthmanagement and resources at home but patient verbalized that his daughter (also diabetic and had previously received services from [facility]) have been assisting him in managing his health conditions at home (DM/ HTN).  Patientand daughter expressed understandingto seekreferral to [facility] care managementfrom primary care providerif deemed necessary  andappropriate foranyservices in thenear future- once he is discharge back home.  [facility] care management information provided for future needs that patient may have.   Primary care provider's office is listed as providing transition of care (TOC) follow-up.   For additional questions please contact:  Edwena Felty A. Mariame Rybolt, BSN, RN-BC [facility] PRIMARY CARE Navigator Cell: [phone]

## 2017-12-02 NOTE — Progress Notes (Signed)
Michael [name] has [facility], clinical for patient to discharge there Friday if medically stable.   [name] [name] [name] Kole LCSW [phone]

## 2017-12-02 NOTE — Plan of Care (Signed)
  Problem: Education: Goal: Knowledge of General Education information will improve Description Including pain rating scale, medication(s)/side effects and non-pharmacologic comfort measures Outcome: Progressing Note:  POC and pain management reviewed with pt.; pt. moaning off/on and refusing stronger pain med.

## 2017-12-03 LAB — GLUCOSE, CAPILLARY
GLUCOSE-CAPILLARY: 188 mg/dL — AB (ref 70–99)
Glucose-Capillary: 225 mg/dL — ABNORMAL HIGH (ref 70–99)
Glucose-Capillary: 194 mg/dL — ABNORMAL HIGH (ref 70–99)
Glucose-Capillary: 235 mg/dL — ABNORMAL HIGH (ref 70–99)

## 2017-12-03 NOTE — Progress Notes (Signed)
CSW spoke with patient's daughter, Marlowe Kays, at bedside. CSW introduced self as covering CSW for today. Patient and his daughter reported that he will not be discharging to [facility] and that they want [facility]. CSW explained that per notes, patient's daughter [name] selected [facility] and reported it to the CSW. Marlowe Kays stated that she spoke with [place] and she reported that she only talked about [facility], not that she had chosen it. Marlowe Kays stated the patient is now oriented and would never have chosen [facility]. Patient stated that his wife was at [facility] for a long time and he will not go there. CSW explained that [facility] already has insurance authorization, which took three days to receive. [facility] does not have beds available until next week. Additionally, insurance will have to be started over with a new facility and they are closed on the weekend. CSW explained that the change in facility should have been reported by the patient days ago, as patient has been in the hospital since October with time to decide on a facility. Marlowe Kays stated that she didn't know a facility had been selected. CSW to confer with the RNCM.   [name] [name] [facility] Stacks LCSW [phone]

## 2017-12-03 NOTE — Progress Notes (Signed)
Patient ID: Michael [name], male   DOB: Mar 20, 1930, 83 y.o.   MRN: [id number]   Comfortable except for chronic pain Tolerating po Having BM's Abdomen soft  Plan: Ok to transfer to SNF today

## 2017-12-03 NOTE — Care Management Important Message (Signed)
Important Message  Patient Details  Name: Michael [name] MRN: [id number] Date of Birth: 11-26-1930   Medicare Important Message Given:  Yes  Notice read and explained to patient and daughter Michael [name]. Both voiced understanding.  Marilu Favre, RN 12/03/2017, 2:55 PM

## 2017-12-03 NOTE — Care Management (Signed)
Called Dr Trevor Mace office, spoke to [place] awaiting call back. Magdalen Spatz RN BSN [phone]

## 2017-12-03 NOTE — Plan of Care (Signed)
  Problem: Education: Goal: Knowledge of General Education information will improve Description Including pain rating scale, medication(s)/side effects and non-pharmacologic comfort measures Outcome: Progressing Note:  POC and pain management reviewed with pt./daughter.

## 2017-12-03 NOTE — Progress Notes (Signed)
Physical Therapy Treatment Patient Details Name: Michael [name] MRN: [id number] DOB: 02-22-1930 Today's Date: 12/03/2017    History of Present Illness Pt is a 84 y.o. M with significant PMH of CLL who presents s/p left inguinal hernia repair with mesh and small bowel resection    PT Comments    Patient seen for mobility progression. Pt tolerated increased activity and requires min/mod A for functional transfers and min A (+2 for chair follow) for gait training. Continue to progress as tolerated with anticipated d/c to SNF for further skilled PT services.    Follow Up Recommendations  SNF     Equipment Recommendations  None recommended by PT    Recommendations for Other Services       Precautions / Restrictions Precautions Precautions: Fall    Mobility  Bed Mobility Overal bed mobility: Needs Assistance Bed Mobility: Supine to Sit;Sit to Supine     Supine to sit: Min assist;HOB elevated Sit to supine: Min guard   General bed mobility comments: use of rail; assist to elevate trunk into sitting  Transfers Overall transfer level: Needs assistance Equipment used: Left platform walker Transfers: Sit to/from Stand Sit to Stand: Mod assist;Min assist         General transfer comment: min A from elevated bed height and mod A from recliner to power up into standing; cues for positioning prior to attempt  Ambulation/Gait Ambulation/Gait assistance: Min assist;+2 safety/equipment Gait Distance (Feet): (70 ft X 4 trials with 2 seated rest breaks) Assistive device: Left platform walker Gait Pattern/deviations: Step-through pattern;Decreased stride length;Trunk flexed Gait velocity: decreased   General Gait Details: cues for posture and increased stride length   Stairs             Wheelchair Mobility    Modified Rankin (Stroke Patients Only)       Balance Overall balance assessment: Needs assistance Sitting-balance support: Feet supported Sitting  balance-Leahy Scale: Fair     Standing balance support: Bilateral upper extremity supported;During functional activity Standing balance-Leahy Scale: Poor                              Cognition Arousal/Alertness: Awake/alert Behavior During Therapy: WFL for tasks assessed/performed Overall Cognitive Status: Within Functional Limits for tasks assessed                                        Exercises      General Comments        Pertinent Vitals/Pain Pain Assessment: Faces Faces Pain Scale: Hurts little more Pain Location: L wrist  Pain Descriptors / Indicators: Grimacing;Guarding;Sore;Tender Pain Intervention(s): Monitored during session;Repositioned    Home Living                      Prior Function            PT Goals (current goals can now be found in the care plan section) Progress towards PT goals: Progressing toward goals    Frequency    Min 2X/week      PT Plan Current plan remains appropriate    Co-evaluation              AM-PAC PT "6 Clicks" Daily Activity  Outcome Measure  Difficulty turning over in bed (including adjusting bedclothes, sheets and blankets)?: Unable Difficulty moving from lying on back to  sitting on the side of the bed? : Unable Difficulty sitting down on and standing up from a chair with arms (e.g., wheelchair, bedside commode, etc,.)?: Unable Help needed moving to and from a bed to chair (including a wheelchair)?: A Little Help needed walking in hospital room?: A Little Help needed climbing 3-5 steps with a railing? : A Lot 6 Click Score: 11    End of Session Equipment Utilized During Treatment: Gait belt Activity Tolerance: Patient tolerated treatment well Patient left: with call bell/phone within reach;with family/visitor present;in bed Nurse Communication: Mobility status PT Visit Diagnosis: Unsteadiness on feet (R26.81);Muscle weakness (generalized) (M62.81);Difficulty in walking,  not elsewhere classified (R26.2);Pain Pain - part of body: (abdominal)     Time: 1528-1600 PT Time Calculation (min) (ACUTE ONLY): 32 min  Charges:  $Gait Training: 23-37 mins                     Earney Navy, PTA Acute Rehabilitation Services Pager: [phone] Office: [phone]     Darliss Cheney 12/03/2017, 5:15 PM

## 2017-12-03 NOTE — Discharge Summary (Addendum)
Physician Discharge Summary  Patient ID: Michael [name] MRN: [id number] DOB/AGE: [birth year] 84 y.o.  Admit date: 11/10/2017 Discharge date: 12/05/17  Admission Diagnoses:  Discharge Diagnoses:  Active Problems:   S/P left inguinal hernia repair   Ileus (HCC)   Pressure injury of skin moderate to severe malnutrition Anemia of chronic disease Acute renal failure  Discharged Condition: fair  Hospital Course: Mr. Aime was admitted on October 16 for repair of a chronically incarcerated inguinal hernia.  He tolerated the procedure.  The next morning, he has significant swelling in the left inguinal area which was thought to be a combination of seroma and hematoma given the large chronic incarceration that he had previously.  He had mild discomfort but no abdominal complaints.  He then became constipated and had to strain significantly.  After several days if not improving, a CAT scan was performed which showed that he had re-incarcerated small bowel in the inguinal area.  He returned to the operating room for repair of the recurrence and had to have a small bowel resection.  He developed an ileus and acute renal failure.  Nephrology was asked to see the patient and helped correct his electrolytes.  He remained quite deconditioned.  Physical therapy and Occupational Therapy were asked see the patient.  He was also started on TNA.  He slowly improved regarding his renal condition.  His bowel function slowly improved and the nasogastric tube was removed.  He was finally started on a diet.  He has significant chronic discomfort in his joints.  He started developing some mild pressure ulcerations which were controlled by the wound care team.  He continued a slow improvement and was finally tolerating a soft diet, having bowel movements, and working further with physical therapy.  At this point, he became stable enough for transfer to a SNF for continued aggressive rehab.  Unfortunately, there was a  mixup in the location of the discharge.  The patient thought he was going to one facility, but a sibling had actually accepted placement at another facility.  This was the location where his wife passed away.  His discharge was suspended.  He appealed medicare but they denied it.  However, on the morning of 11/10, he complained of more pain.  Labs were obtained which were fine.  He was not requiring additional pain medication.  KUB was ordered and is pending.  If no suspicious findings, would be ok for d/c.    Consults: nephrology  Significant Diagnostic Studies:   Treatments: surgery: repair of incarcerated left inguinal hernia with mesh.  Re-exploration of left groin with hernia repair and small bowel resection  Discharge Exam: Blood pressure (!) 115/56, pulse (!) 57, temperature 98.3 F (36.8 C), temperature source Oral, resp. rate 16, height 5' 5.98" (1.676 m), weight 78.2 kg, SpO2 98 %. General appearance: alert, cooperative and no distress Resp: clear to auscultation bilaterally Cardio: regular rate and rhythm, S1, S2 normal, no murmur, click, rub or gallop Incision/Wound:abdomen soft, groin incision clean  Disposition: Discharge disposition: 03-Skilled Nursing Facility        Allergies as of 12/05/2017   No Known Allergies     Medication List    TAKE these medications   acetaminophen 650 MG CR tablet Commonly known as:  TYLENOL Take 1,300 mg by mouth 2 (two) times daily as needed for pain.   aspirin EC 81 MG tablet Take 81 mg by mouth daily.   docusate sodium 50 MG capsule Commonly known as:  COLACE Take  50 mg by mouth 2 (two) times daily as needed for moderate constipation.   IMBRUVICA 140 MG capsul Generic drug:  ibrutinib TAKE 2 CAPSULES (280 MG TOTAL) BY MOUTH DAILY. What changed:  See the new instructions.   insulin NPH-regular Human (70-30) 100 UNIT/ML injection Inject 15-20 Units into the skin 2 (two) times daily. Sliding scale   metFORMIN 1000 MG  tablet Commonly known as:  GLUCOPHAGE Take 1,000 mg by mouth 2 (two) times daily with a meal.   morphine 15 MG tablet Commonly known as:  MSIR Take 15 mg by mouth 2 (two) times daily.   multivitamin with minerals Tabs tablet Take 1 tablet by mouth daily.   oxyCODONE 5 MG immediate release tablet Commonly known as:  Oxy IR/ROXICODONE Take 1 tablet (5 mg total) by mouth every 6 (six) hours as needed for moderate pain, severe pain or breakthrough pain.   pantoprazole 40 MG tablet Commonly known as:  PROTONIX Take 1 tablet (40 mg total) by mouth daily.   ramipril 5 MG capsule Commonly known as:  ALTACE Take 5 mg by mouth daily.   SYSTANE OP Place 1 drop into both eyes 2 (two) times daily.   tamsulosin 0.4 MG Caps capsule Commonly known as:  FLOMAX Take 0.4 mg by mouth daily after supper.   torsemide 20 MG tablet Commonly known as:  DEMADEX Take 20 mg by mouth daily after supper.   vitamin B-12 1000 MCG tablet Commonly known as:  CYANOCOBALAMIN Take 1,000 mcg by mouth daily.   Vitamin D3 25 MCG (1000 UT) Caps Take 1,000 Units by mouth 2 (two) times daily.       Contact information for follow-up providers    Coralie Keens, MD. Schedule an appointment as soon as possible for a visit in 2 week(s).   Specialty:  General Surgery Contact information: [address] [place] [address] [phone]        Heath Lark, MD. Schedule an appointment as soon as possible for a visit in 3 week(s).   Specialty:  Hematology and Oncology Contact information: [place] [phone]            Contact information for after-discharge care    Destination    [facility]erred SNF .   Service:  Skilled Nursing Contact information: [address] [place] [phone]                  Signed: Stark Klein 12/05/2017, 9:47 AM

## 2017-12-03 NOTE — Care Management Note (Addendum)
Case Management Note  Patient Details  Name: [name] MRN: [id number] Date of Birth: 12-20-1930  Subjective/Objective:                    Action/Plan: See social worker note. Discussed discharge with patient and daughter Michael [name]. Patient and Michael Kays do not feel he is ready for discharge. Medicare Important message given and explained. Both voiced understanding and plan to appeal.   Awaiting call back from [facility] at Dr Trevor Mace office. Have not received call.  [place] with Sharyn Lull at Dr Trevor Mace office to explain patient has appealed his discharge.  Expected Discharge Date:  12/03/17               Expected Discharge Plan:  Skilled Nursing Facility  In-House Referral:  Clinical Social Work  Discharge planning Services  CM Consult  Post Acute Care Choice:  NA Choice offered to:  Patient, Adult Children  DME Arranged:  N/A DME Agency:  NA  HH Arranged:  NA HH Agency:  NA  Status of Service:  In process, will continue to follow  If discussed at Long Length of Stay Meetings, dates discussed:    Additional Comments:  Marilu Favre, RN 12/03/2017, 2:57 PM

## 2017-12-03 NOTE — Progress Notes (Signed)
Left message with social worker [name] [name] to discuss placement upon discharge. Pt. Daughter states "[facility] was not her father's wishes and he would like to be placed at [facility] instead". Social Worker Geographical information systems officer notified as well.   [place] Lanae Boast, RN

## 2017-12-03 NOTE — Progress Notes (Signed)
Patient ID: Michael [name], male   DOB: 28-Aug-1930, 84 y.o.   MRN: [id number]   Patient and family want different SNF so will have to cancel discharge.  He does not need any IV fluids or IV meds

## 2017-12-04 LAB — GLUCOSE, CAPILLARY
GLUCOSE-CAPILLARY: 198 mg/dL — AB (ref 70–99)
Glucose-Capillary: 228 mg/dL — ABNORMAL HIGH (ref 70–99)

## 2017-12-04 NOTE — Progress Notes (Addendum)
CSW received voicemail from [employer] Appeal case Freight forwarder. She requested the D&D and IM be faxed to [phone]. Her contact info is [phone]. RNCM alerted.  Cedric Fishman JNWM [phone]    All forms required for [facility] Appeal including those above were successfully faxed yesterday 12/03/17. Fax confirmation visualized.  Case ID [id number] This CM left VM at number requesting callback to clarify. Went ahead and faxed D&D and IM again.  Carles Collet RN [employer] [phone]

## 2017-12-04 NOTE — Progress Notes (Signed)
Notified by Nathaniel Man, [facility] administrator, that DC decision was up help by [facility] and that cost becomes patient responsibility at 12:00 noon tomorrow (Sunday).   Spoke w patient and youngest daughter at bedside to inform them of above. Patient (awake and oriented) asked if he could go to [facility] to avoid cost of paying and then transfer to another SNF. CM confirmed with Caryl Pina CSW that he still has bed available and that he could transfer once there. Patient agreeable to DC to [facility] in the AM.  Spoke w Dr Grandville Silos, updated on situation, asked him to update DC note early tomorrow so DC to [facility] can be facilitated by CSW, he agreed.   CM signing off, DC to SNF in AM facilitated by CSW.

## 2017-12-04 NOTE — Progress Notes (Signed)
CSW received voicemail from [employer] Appeal case Freight forwarder. She requested the D&D and IM be faxed to [phone]. Her contact info is [phone]. RNCM alerted.  [name] [name] Geneive Sandstrom LCSW [phone]

## 2017-12-04 NOTE — Progress Notes (Signed)
19 Days Post-Op   Subjective/Chief Complaint: Discharge cancelled due to wrong snf.  Pt wife died at location that was previously available.  Denies n/v.  Continues having flatus and BM.  Feels a little sore this AM.     Objective: Vital signs in last 24 hours: Temp:  [98.1 F (36.7 C)-98.3 F (36.8 C)] 98.2 F (36.8 C) (11/09 0537) Pulse Rate:  [57-65] 65 (11/09 0537) Resp:  [15-18] 18 (11/09 0537) BP: (103-121)/(49-56) 108/53 (11/09 0537) SpO2:  [98 %-100 %] 98 % (11/09 0537) Last BM Date: 12/02/17  Intake/Output from previous day: 11/08 0701 - 11/09 0700 In: 390 [P.O.:390] Out: -  Intake/Output this shift: No intake/output data recorded.  General appearance: sleeping, but easily arousable. Resp: clear to auscultation bilaterally GI: soft, non distended, non tender.  incision c/d/i. ext warm, no edema.  Lab Results:  No results for input(s): WBC, HGB, HCT, PLT in the last 72 hours. BMET No results for input(s): NA, K, CL, CO2, GLUCOSE, BUN, CREATININE, CALCIUM in the last 72 hours. PT/INR No results for input(s): LABPROT, INR in the last 72 hours. ABG No results for input(s): PHART, HCO3 in the last 72 hours.  Invalid input(s): PCO2, PO2  Studies/Results: No results found.  Anti-infectives: Anti-infectives (From admission, onward)   Start     Dose/Rate Route Frequency Ordered Stop   11/15/17 1400  piperacillin-tazobactam (ZOSYN) IVPB 3.375 g  Status:  Discontinued     3.375 g 12.5 mL/hr over 240 Minutes Intravenous Every 8 hours 11/15/17 0718 11/26/17 0821   11/15/17 0800  piperacillin-tazobactam (ZOSYN) IVPB 3.375 g     3.375 g 100 mL/hr over 30 Minutes Intravenous  Once 11/15/17 0718 11/15/17 0923   11/10/17 1210  ceFAZolin (ANCEF) 2-4 GM/100ML-% IVPB    Note to Pharmacy:  [name], [place]   : cabinet override      11/10/17 1210 11/10/17 1451   11/10/17 1200  ceFAZolin (ANCEF) IVPB 2g/100 mL premix     2 g 200 mL/hr over 30 Minutes Intravenous On call to  O.R. 11/10/17 1158 11/10/17 1501      Assessment/Plan: s/p Procedure(s): LEFT INGUINAL HERNIA REPAIR WITH MESH (Left) GROIN EXPLORATION WITH REMOVAL OF INGUINAL HERNIA MESH (Left) SMALL BOWEL RESECTION (N/A) stable.  D/c when alternative SNF available.    LOS: 20 days    Stark Klein 12/04/2017

## 2017-12-05 ENCOUNTER — Inpatient Hospital Stay (HOSPITAL_COMMUNITY): Payer: Medicare HMO

## 2017-12-05 DIAGNOSIS — Z79899 Other long term (current) drug therapy: Secondary | ICD-10-CM | POA: Diagnosis not present

## 2017-12-05 DIAGNOSIS — E1122 Type 2 diabetes mellitus with diabetic chronic kidney disease: Secondary | ICD-10-CM | POA: Diagnosis present

## 2017-12-05 DIAGNOSIS — L8990 Pressure ulcer of unspecified site, unspecified stage: Secondary | ICD-10-CM | POA: Diagnosis not present

## 2017-12-05 DIAGNOSIS — I129 Hypertensive chronic kidney disease with stage 1 through stage 4 chronic kidney disease, or unspecified chronic kidney disease: Secondary | ICD-10-CM | POA: Diagnosis present

## 2017-12-05 DIAGNOSIS — N3000 Acute cystitis without hematuria: Secondary | ICD-10-CM | POA: Diagnosis not present

## 2017-12-05 DIAGNOSIS — D638 Anemia in other chronic diseases classified elsewhere: Secondary | ICD-10-CM | POA: Diagnosis present

## 2017-12-05 DIAGNOSIS — R140 Abdominal distension (gaseous): Secondary | ICD-10-CM | POA: Diagnosis not present

## 2017-12-05 DIAGNOSIS — Z9842 Cataract extraction status, left eye: Secondary | ICD-10-CM | POA: Diagnosis not present

## 2017-12-05 DIAGNOSIS — Z7982 Long term (current) use of aspirin: Secondary | ICD-10-CM | POA: Diagnosis not present

## 2017-12-05 DIAGNOSIS — E876 Hypokalemia: Secondary | ICD-10-CM | POA: Diagnosis present

## 2017-12-05 DIAGNOSIS — E440 Moderate protein-calorie malnutrition: Secondary | ICD-10-CM | POA: Diagnosis not present

## 2017-12-05 DIAGNOSIS — H04129 Dry eye syndrome of unspecified lacrimal gland: Secondary | ICD-10-CM | POA: Diagnosis not present

## 2017-12-05 DIAGNOSIS — E119 Type 2 diabetes mellitus without complications: Secondary | ICD-10-CM | POA: Diagnosis not present

## 2017-12-05 DIAGNOSIS — Z794 Long term (current) use of insulin: Secondary | ICD-10-CM | POA: Diagnosis not present

## 2017-12-05 DIAGNOSIS — R5381 Other malaise: Secondary | ICD-10-CM | POA: Diagnosis not present

## 2017-12-05 DIAGNOSIS — K4031 Unilateral inguinal hernia, with obstruction, without gangrene, recurrent: Secondary | ICD-10-CM | POA: Diagnosis not present

## 2017-12-05 DIAGNOSIS — R531 Weakness: Secondary | ICD-10-CM | POA: Diagnosis not present

## 2017-12-05 DIAGNOSIS — R031 Nonspecific low blood-pressure reading: Secondary | ICD-10-CM | POA: Diagnosis not present

## 2017-12-05 DIAGNOSIS — I119 Hypertensive heart disease without heart failure: Secondary | ICD-10-CM | POA: Diagnosis not present

## 2017-12-05 DIAGNOSIS — M1990 Unspecified osteoarthritis, unspecified site: Secondary | ICD-10-CM | POA: Diagnosis not present

## 2017-12-05 DIAGNOSIS — I10 Essential (primary) hypertension: Secondary | ICD-10-CM | POA: Diagnosis not present

## 2017-12-05 DIAGNOSIS — N182 Chronic kidney disease, stage 2 (mild): Secondary | ICD-10-CM | POA: Diagnosis present

## 2017-12-05 DIAGNOSIS — K4090 Unilateral inguinal hernia, without obstruction or gangrene, not specified as recurrent: Secondary | ICD-10-CM | POA: Diagnosis not present

## 2017-12-05 DIAGNOSIS — M792 Neuralgia and neuritis, unspecified: Secondary | ICD-10-CM | POA: Diagnosis not present

## 2017-12-05 DIAGNOSIS — Z7401 Bed confinement status: Secondary | ICD-10-CM | POA: Diagnosis not present

## 2017-12-05 DIAGNOSIS — K4091 Unilateral inguinal hernia, without obstruction or gangrene, recurrent: Secondary | ICD-10-CM | POA: Diagnosis present

## 2017-12-05 DIAGNOSIS — L89159 Pressure ulcer of sacral region, unspecified stage: Secondary | ICD-10-CM | POA: Diagnosis not present

## 2017-12-05 DIAGNOSIS — C9110 Chronic lymphocytic leukemia of B-cell type not having achieved remission: Secondary | ICD-10-CM | POA: Diagnosis not present

## 2017-12-05 DIAGNOSIS — E871 Hypo-osmolality and hyponatremia: Secondary | ICD-10-CM | POA: Diagnosis not present

## 2017-12-05 DIAGNOSIS — I9589 Other hypotension: Secondary | ICD-10-CM | POA: Diagnosis not present

## 2017-12-05 DIAGNOSIS — R1312 Dysphagia, oropharyngeal phase: Secondary | ICD-10-CM | POA: Diagnosis not present

## 2017-12-05 DIAGNOSIS — M2550 Pain in unspecified joint: Secondary | ICD-10-CM | POA: Diagnosis not present

## 2017-12-05 DIAGNOSIS — E1136 Type 2 diabetes mellitus with diabetic cataract: Secondary | ICD-10-CM | POA: Diagnosis not present

## 2017-12-05 DIAGNOSIS — N179 Acute kidney failure, unspecified: Secondary | ICD-10-CM | POA: Diagnosis not present

## 2017-12-05 DIAGNOSIS — R69 Illness, unspecified: Secondary | ICD-10-CM | POA: Diagnosis not present

## 2017-12-05 DIAGNOSIS — K4030 Unilateral inguinal hernia, with obstruction, without gangrene, not specified as recurrent: Secondary | ICD-10-CM | POA: Diagnosis not present

## 2017-12-05 DIAGNOSIS — I959 Hypotension, unspecified: Secondary | ICD-10-CM | POA: Diagnosis not present

## 2017-12-05 DIAGNOSIS — M6281 Muscle weakness (generalized): Secondary | ICD-10-CM | POA: Diagnosis not present

## 2017-12-05 DIAGNOSIS — R41841 Cognitive communication deficit: Secondary | ICD-10-CM | POA: Diagnosis not present

## 2017-12-05 DIAGNOSIS — K460 Unspecified abdominal hernia with obstruction, without gangrene: Secondary | ICD-10-CM | POA: Diagnosis not present

## 2017-12-05 DIAGNOSIS — Z961 Presence of intraocular lens: Secondary | ICD-10-CM | POA: Diagnosis present

## 2017-12-05 DIAGNOSIS — M25532 Pain in left wrist: Secondary | ICD-10-CM | POA: Diagnosis not present

## 2017-12-05 DIAGNOSIS — L89616 Pressure-induced deep tissue damage of right heel: Secondary | ICD-10-CM | POA: Diagnosis present

## 2017-12-05 DIAGNOSIS — K219 Gastro-esophageal reflux disease without esophagitis: Secondary | ICD-10-CM | POA: Diagnosis not present

## 2017-12-05 DIAGNOSIS — L89150 Pressure ulcer of sacral region, unstageable: Secondary | ICD-10-CM | POA: Diagnosis present

## 2017-12-05 DIAGNOSIS — T451X5A Adverse effect of antineoplastic and immunosuppressive drugs, initial encounter: Secondary | ICD-10-CM | POA: Diagnosis not present

## 2017-12-05 DIAGNOSIS — I808 Phlebitis and thrombophlebitis of other sites: Secondary | ICD-10-CM | POA: Diagnosis not present

## 2017-12-05 DIAGNOSIS — R2689 Other abnormalities of gait and mobility: Secondary | ICD-10-CM | POA: Diagnosis not present

## 2017-12-05 DIAGNOSIS — M25512 Pain in left shoulder: Secondary | ICD-10-CM | POA: Diagnosis not present

## 2017-12-05 DIAGNOSIS — Z9841 Cataract extraction status, right eye: Secondary | ICD-10-CM | POA: Diagnosis not present

## 2017-12-05 LAB — BASIC METABOLIC PANEL
Anion gap: 7 (ref 5–15)
BUN: 13 mg/dL (ref 8–23)
CHLORIDE: 107 mmol/L (ref 98–111)
CO2: 21 mmol/L — AB (ref 22–32)
CREATININE: 0.83 mg/dL (ref 0.61–1.24)
Calcium: 8 mg/dL — ABNORMAL LOW (ref 8.9–10.3)
GFR calc non Af Amer: >60 mL/min (ref >60)
Glucose, Bld: 233 mg/dL — ABNORMAL HIGH (ref 70–99)
Potassium: 4.8 mmol/L (ref 3.5–5.1)
Sodium: 135 mmol/L (ref 135–145)

## 2017-12-05 LAB — CBC
HCT: 29.4 % — ABNORMAL LOW (ref 39.0–52.0)
HEMOGLOBIN: 8.6 g/dL — AB (ref 13.0–17.0)
MCH: 25.1 pg — ABNORMAL LOW (ref 26.0–34.0)
MCHC: 29.3 g/dL — AB (ref 30.0–36.0)
MCV: 86 fL (ref 80.0–100.0)
Platelets: 275 10*3/uL (ref 150–400)
RBC: 3.42 MIL/uL — ABNORMAL LOW (ref 4.22–5.81)
RDW: 16 % — ABNORMAL HIGH (ref 11.5–15.5)
WBC: 7.9 10*3/uL (ref 4.0–10.5)
nRBC: 0 % (ref 0.0–0.2)

## 2017-12-05 LAB — GLUCOSE, CAPILLARY
GLUCOSE-CAPILLARY: 230 mg/dL — AB (ref 70–99)
Glucose-Capillary: 220 mg/dL — ABNORMAL HIGH (ref 70–99)

## 2017-12-05 NOTE — Progress Notes (Signed)
20 Days Post-Op   Subjective/Chief Complaint: Pt complained of more abdominal pain this AM.  He denies n/v.  He had a very large BM yesterday.    Objective: Vital signs in last 24 hours: Temp:  [98.3 F (36.8 C)-98.8 F (37.1 C)] 98.3 F (36.8 C) (11/10 0544) Pulse Rate:  [57-80] 57 (11/10 0544) Resp:  [16-18] 16 (11/10 0544) BP: (104-115)/(56-77) 115/56 (11/10 0544) SpO2:  [98 %-100 %] 98 % (11/10 0544) Last BM Date: 12/04/17  Intake/Output from previous day: 11/09 0701 - 11/10 0700 In: -  Out: 850 [Urine:850] Intake/Output this shift: Total I/O In: 224 [P.O.:224] Out: -   General appearance: looks uncomfortable.  Mild distress. Resp: clear to auscultation bilaterally GI: soft, sl distended.  + bowel sounds.  Sl tender throughout.    ext warm, no edema.  Of note, patient complains of those being tender also.    Lab Results:  Recent Labs    12/05/17 0847  WBC 7.9  HGB 8.6*  HCT 29.4*  PLT 275   BMET Recent Labs    12/05/17 0847  NA 135  K 4.8  CL 107  CO2 21*  GLUCOSE 233*  BUN 13  CREATININE 0.83  CALCIUM 8.0*   PT/INR No results for input(s): LABPROT, INR in the last 72 hours. ABG No results for input(s): PHART, HCO3 in the last 72 hours.  Invalid input(s): PCO2, PO2  Studies/Results: No results found.  Anti-infectives: Anti-infectives (From admission, onward)   Start     Dose/Rate Route Frequency Ordered Stop   11/15/17 1400  piperacillin-tazobactam (ZOSYN) IVPB 3.375 g  Status:  Discontinued     3.375 g 12.5 mL/hr over 240 Minutes Intravenous Every 8 hours 11/15/17 0718 11/26/17 0821   11/15/17 0800  piperacillin-tazobactam (ZOSYN) IVPB 3.375 g     3.375 g 100 mL/hr over 30 Minutes Intravenous  Once 11/15/17 0718 11/15/17 0923   11/10/17 1210  ceFAZolin (ANCEF) 2-4 GM/100ML-% IVPB    Note to Pharmacy:  [name], [place]   : cabinet override      11/10/17 1210 11/10/17 1451   11/10/17 1200  ceFAZolin (ANCEF) IVPB 2g/100 mL premix     2  g 200 mL/hr over 30 Minutes Intravenous On call to O.R. 11/10/17 1158 11/10/17 1501      Assessment/Plan: s/p Procedure(s): LEFT INGUINAL HERNIA REPAIR WITH MESH (Left) GROIN EXPLORATION WITH REMOVAL OF INGUINAL HERNIA MESH (Left) SMALL BOWEL RESECTION (N/A) Not sure what to make of the abdominal tenderness and bloating.  Has been stable before this AM.  Got labs which look fine.  Will also get KUB.  ? Abdominal symptoms manifestation of anxiety given that he complains of tenderness in legs and chest also.   Biggest issue is dispo. Symptoms are worse today, but objective signs are unchanged.  He has not required IV pain medication.    Would still discharge if bed available as long as KUB is OK.     LOS: 21 days    Stark Klein 12/05/2017

## 2017-12-05 NOTE — Progress Notes (Signed)
Patient incontinent of large, loose stool.

## 2017-12-05 NOTE — Progress Notes (Signed)
Patient offers multiple complaints.  States he has pain in LLQ and BLE.  Patient very animated re: pain.  Able to MAE.  Also complaining of pain at site of decubitus.  Refusing to turn for staff.  During conversation with surgeon, patient stated that he has not had a stool.  Patient was incontinent of a large loose stool the previous morning.  Daughter in attendance.

## 2017-12-05 NOTE — Clinical Social Work Placement (Signed)
   CLINICAL SOCIAL WORK PLACEMENT  NOTE  Date:  12/05/2017  Patient Details  Name: Michael [name] MRN: [id number] Date of Birth: Oct 28, 1930  Clinical Social Work is seeking post-discharge placement for this patient at the [place] level of care (*CSW will initial, date and re-position this form in  chart as items are completed):  Yes   Patient/family provided with [facility] Work Department's list of facilities offering this level of care within the geographic area requested by the patient (or if unable, by the patient's family).  Yes   Patient/family informed of their freedom to choose among providers that offer the needed level of care, that participate in Medicare, Medicaid or managed care program needed by the patient, have an available bed and are willing to accept the patient.  Yes   Patient/family informed of [facility]'s ownership interest in [facility] and [facility], as well as of the fact that they are under no obligation to receive care at these facilities.  PASRR submitted to EDS on       PASRR number received on       Existing PASRR number confirmed on 11/18/17     FL2 transmitted to all facilities in geographic area requested by pt/family on 11/18/17     FL2 transmitted to all facilities within larger geographic area on       Patient informed that his/her managed care company has contracts with or will negotiate with certain facilities, including the following:        Yes   Patient/family informed of bed offers received.  Patient chooses bed at [facility]     Physician recommends and patient chooses bed at      Patient to be transferred to [facility] on 12/01/17.  Patient to be transferred to facility by [facility]     Patient family notified on 12/05/17 of transfer.  Name of family member notified:  [name]     PHYSICIAN Please prepare priority discharge summary, including medications, Please prepare  prescriptions     Additional Comment:    _______________________________________________ Alberteen Sam, LCSW 12/05/2017, 10:54 AM

## 2017-12-05 NOTE — Progress Notes (Signed)
Report called to [facility] and patient discharged via [facility] ambulance service on stretcher.  Family accompanied patient via own private vehicles with his belongings.  Chemotherapeutic medication returned from pharmacy to patient.

## 2017-12-05 NOTE — Clinical Social Work Note (Signed)
Clinical Social Work Assessment  Patient Details  Name: Michael [name] MRN: [id number] Date of Birth: 1930/04/27  Date of referral:  12/05/17               Reason for consult:  Facility Placement, Discharge Planning                Permission sought to share information with:  Family Supports, Customer service manager Permission granted to share information::  Yes, Verbal Permission Granted  Name::     Music therapist::  SNFs  Relationship::  pt daughter  Contact Information:  [phone]  Housing/Transportation Living arrangements for the past 2 months:  Single Family Home Source of Information:  Patient, Adult Children Patient Interpreter Needed:  None Criminal Activity/Legal Involvement Pertinent to Current Situation/Hospitalization:  No - Comment as needed Significant Relationships:  Adult Children, Other Family Members Lives with:  Self Do you feel safe going back to the place where you live?  No Need for family participation in patient care:  Yes (Comment)  Care giving concerns:  CSW received referral for possible SNF placement at time of discharge. Floor CSW Michiel Cowboy per notes spoke with patient regarding possibility of SNF placement . Patient's daughters are currently unable to care for him at their home given patient's current needs and fall risk.  Patient daughter Michael [name]  expressed understanding of PT recommendation and are agreeable to SNF placement at time of discharge. CSW to continue to follow and assist with discharge planning needs.     Social Worker assessment / plan:  Per notes floor [place] spoke with patient's daughter Michael [name] concerning possibility of rehab at [facility] before returning home.    Employment status:  Retired Nurse, adult PT Recommendations:  [place], [place] / Referral to community resources:  [place]  Patient/Family's Response to care:  Patient and  daughter  recognize need for rehab before returning home and are agreeable to a SNF in [place]. They report preference for  [facility]  . CSW explained insurance authorization process. Patient's family reported that they want patient to get stronger to be able to come back home.    Patient/Family's Understanding of and Emotional Response to Diagnosis, Current Treatment, and Prognosis:  Patient/family is realistic regarding therapy needs and expressed being hopeful for SNF placement. Patient expressed understanding of CSW role and discharge process as well as medical condition. No questions/concerns about plan or treatment.    Emotional Assessment Appearance:  Appears stated age Attitude/Demeanor/Rapport:  Engaged, Gracious Affect (typically observed):  Accepting, Adaptable, Appropriate, Quiet Orientation:  Oriented to Self, Oriented to Place, Oriented to  Time, Oriented to Situation Alcohol / Substance use:  Not Applicable Psych involvement (Current and /or in the community):  No (Comment)  Discharge Needs  Concerns to be addressed:  Care Coordination Readmission within the last 30 days:  No Current discharge risk:  Dependent with Mobility, Physical Impairment Barriers to Discharge:  Ship broker, Continued Medical Work up   [employer], LCSW 12/05/2017, 10:52 AM

## 2017-12-05 NOTE — Progress Notes (Signed)
Patient will DC BP:[id number] Anticipated DC date: 12/05/17 Family notified: Annet[facility] Transport OE:[id number]  Per MD patient ready for DC to [facility] . RN, patient, patient's family, and facility notified of DC. Discharge Summary sent to facility. RN given number for report [phone]. DC packet on chart. Ambulance transport requested for patient.  CSW signing off.  [place], [place]

## 2017-12-05 NOTE — NC FL2 (Addendum)
[place] MEDICAID FL2 LEVEL OF CARE SCREENING TOOL     IDENTIFICATION  Patient Name: Michael [name] Birthdate: 1930/07/26 Sex: male Admission Date (Current Location): 11/10/2017  [facility] and [place] Number:  Herbalist and Address:  [facility]. [facility], [place] [address] [place], [place] [address]      Provider Number: [id number]  Attending Physician Name and Address:  Coralie Keens, MD  Relative Name and Phone Number:  Darci Needle, daughter, [phone]    Current Level of Care: Hospital Recommended Level of Care: [place] Prior Approval Number:    Date Approved/Denied:   PASRR Number: [id number]  Discharge Plan: SNF    Current Diagnoses: Patient Active Problem List   Diagnosis Date Noted  . Pressure injury of skin 11/22/2017  . Ileus ([place]) 11/14/2017  . S/P left inguinal hernia repair 11/10/2017  . Weight loss, non-intentional 04/12/2017  . Essential hypertension 10/20/2016  . Paresthesias 10/19/2016  . Left inguinal hernia 10/12/2016  . Protein-calorie malnutrition, mild ([place]) 01/10/2016  . Diarrhea due to drug 06/17/2015  . Chronic left shoulder pain 06/17/2015  . Bilateral leg edema 06/17/2015  . Pancytopenia, acquired ([place]) 04/22/2015  . Thrombocytopenia ([place]) 10/11/2014  . 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 CLL (chronic lymphocytic leukemia) ([place]) 12/08/2010    Orientation RESPIRATION BLADDER Height & Weight     Self, Time, Situation, Place  Normal Continent Weight: 172 lb 6.4 oz (78.2 kg) Height:  5' 5.98" (167.6 cm)  BEHAVIORAL SYMPTOMS/MOOD NEUROLOGICAL BOWEL NUTRITION STATUS      Continent Diet(see discharge summary)  AMBULATORY STATUS COMMUNICATION OF NEEDS Skin   Extensive Assist Verbally Surgical wounds(incision on left groin with gauze and tape dressing)                       Personal Care Assistance Level of Assistance  Bathing, Feeding, Dressing Bathing Assistance:  Maximum assistance Feeding assistance: Limited assistance Dressing Assistance: Maximum assistance     Functional Limitations Info  Speech, Hearing, Sight Sight Info: Adequate Hearing Info: Adequate Speech Info: Adequate    SPECIAL CARE FACTORS FREQUENCY  PT (By licensed PT), OT (By licensed OT)     PT Frequency: 5x week OT Frequency: 5x week            Contractures Contractures Info: Not present    Additional Factors Info  Code Status, Allergies, Insulin Sliding Scale Code Status Info: Full Code Allergies Info: No Known Allergies   Insulin Sliding Scale Info: insulin aspart (novoLOG) injection 0-15 Units every 4 hours       Current Medications (12/05/2017):  This is the current hospital active medication list Current Facility-Administered Medications  Medication Dose Route Frequency Provider Last Rate Last Dose  . acetaminophen (TYLENOL) tablet 650 mg  650 mg Oral Q6H Jill Alexanders, PA-C   650 mg at 12/05/17 0544  . alum & mag hydroxide-simeth (MAALOX/MYLANTA) 200-200-20 MG/5ML suspension 30 mL  30 mL Oral Q4H PRN Coralie Keens, MD   30 mL at 11/27/17 1559  . diphenhydrAMINE (BENADRYL) 12.5 MG/5ML elixir 12.5 mg  12.5 mg Oral Q6H PRN Coralie Keens, MD   12.5 mg at 11/28/17 0001   Or  . diphenhydrAMINE (BENADRYL) injection 12.5 mg  12.5 mg Intravenous Q6H PRN Coralie Keens, MD      . diphenhydrAMINE (BENADRYL) capsule 25 mg  25 mg Oral Once Ileana Roup, MD      . famotidine (PEPCID) tablet 20 mg  20  mg Oral Daily Franky Macho, RPH   20 mg at 12/04/17 1205  . feeding supplement (ENSURE ENLIVE) (ENSURE ENLIVE) liquid 237 mL  237 mL Oral BID BM Coralie Keens, MD   237 mL at 12/04/17 1500  . HYDROmorphone (DILAUDID) injection 1 mg  1 mg Intravenous Q1H PRN Coralie Keens, MD   1 mg at 12/01/17 0659  . ibrutinib (IMBRUVICA) capsule 280 mg  280 mg Oral Daily Coralie Keens, MD   280 mg at 12/05/17 0543  . insulin aspart (novoLOG) injection  0-15 Units  0-15 Units Subcutaneous TID WC Franky Macho, RPH   5 Units at 12/04/17 1900  . insulin aspart (novoLOG) injection 0-5 Units  0-5 Units Subcutaneous QHS Franky Macho, RPH   0 Units at 12/03/17 2142  . lactated ringers infusion   Intravenous Continuous Coralie Keens, MD 10 mL/hr at 11/22/17 2054 10 mL/hr at 11/22/17 2054  . MEDLINE mouth rinse  15 mL Mouth Rinse BID Coralie Keens, MD   15 mL at 12/03/17 2145  . multivitamin with minerals tablet 1 tablet  1 tablet Oral Daily Coralie Keens, MD   1 tablet at 12/04/17 1205  . ondansetron (ZOFRAN-ODT) disintegrating tablet 4 mg  4 mg Oral Q6H PRN Coralie Keens, MD       Or  . ondansetron [facility]) injection 4 mg  4 mg Intravenous Q6H PRN Coralie Keens, MD   4 mg at 11/28/17 0324  . oxyCODONE (Oxy IR/ROXICODONE) immediate release tablet 5-10 mg  5-10 mg Oral Q4H PRN Coralie Keens, MD   10 mg at 12/05/17 0807  . phenol (CHLORASEPTIC) mouth spray 1 spray  1 spray Mouth/Throat PRN Coralie Keens, MD   1 spray at 11/23/17 2200  . polyethylene glycol (MIRALAX / GLYCOLAX) packet 17 g  17 g Oral Daily Coralie Keens, MD   17 g at 12/04/17 1204  . simethicone (MYLICON) 40 DS/2.8JG suspension 80 mg  80 mg Oral Q6H PRN Coralie Keens, MD   80 mg at 11/13/17 2251  . sodium chloride flush (NS) 0.9 % injection 10-40 mL  10-40 mL Intracatheter PRN Coralie Keens, MD   20 mL at 12/02/17 1820  . tamsulosin (FLOMAX) capsule 0.4 mg  0.4 mg Oral QPC supper Coralie Keens, MD   0.4 mg at 12/04/17 1858  . traMADol (ULTRAM) tablet 50 mg  50 mg Oral Q6H PRN Coralie Keens, MD   50 mg at 12/02/17 8115     Discharge Medications: Please see discharge summary for a list of discharge medications.  Relevant Imaging Results:  Relevant Lab Results:   Additional Information SS#[id number]  [place], [place]

## 2017-12-05 NOTE — Progress Notes (Signed)
KUB complete.  Awaiting results.

## 2017-12-06 ENCOUNTER — Telehealth: Payer: Self-pay

## 2017-12-06 NOTE — Telephone Encounter (Signed)
Received a VM from appt coordinator Rosine Door at [facility] to make pt a f/u appt with Dr Alvy Bimler.  Called her back, no answer, left VM with our contact info.

## 2017-12-07 ENCOUNTER — Telehealth: Payer: Self-pay | Admitting: Hematology and Oncology

## 2017-12-07 DIAGNOSIS — R531 Weakness: Secondary | ICD-10-CM | POA: Diagnosis not present

## 2017-12-07 DIAGNOSIS — E119 Type 2 diabetes mellitus without complications: Secondary | ICD-10-CM | POA: Diagnosis not present

## 2017-12-07 DIAGNOSIS — K460 Unspecified abdominal hernia with obstruction, without gangrene: Secondary | ICD-10-CM | POA: Diagnosis not present

## 2017-12-07 DIAGNOSIS — M792 Neuralgia and neuritis, unspecified: Secondary | ICD-10-CM | POA: Diagnosis not present

## 2017-12-07 NOTE — Telephone Encounter (Signed)
pts facility called to r/s appts

## 2017-12-13 DIAGNOSIS — M25512 Pain in left shoulder: Secondary | ICD-10-CM | POA: Diagnosis not present

## 2017-12-13 DIAGNOSIS — M25532 Pain in left wrist: Secondary | ICD-10-CM | POA: Diagnosis not present

## 2017-12-13 DIAGNOSIS — R031 Nonspecific low blood-pressure reading: Secondary | ICD-10-CM | POA: Diagnosis not present

## 2017-12-13 DIAGNOSIS — M792 Neuralgia and neuritis, unspecified: Secondary | ICD-10-CM | POA: Diagnosis not present

## 2017-12-14 ENCOUNTER — Other Ambulatory Visit: Payer: Self-pay

## 2017-12-14 ENCOUNTER — Encounter (HOSPITAL_COMMUNITY): Payer: Self-pay

## 2017-12-14 ENCOUNTER — Inpatient Hospital Stay: Payer: Medicare HMO

## 2017-12-14 ENCOUNTER — Inpatient Hospital Stay (HOSPITAL_COMMUNITY)
Admission: EM | Admit: 2017-12-14 | Discharge: 2017-12-22 | DRG: 315 | Disposition: A | Discharge status: Skilled Nursing Facility | Payer: Medicare HMO | Attending: Internal Medicine | Admitting: Internal Medicine

## 2017-12-14 ENCOUNTER — Other Ambulatory Visit: Payer: Self-pay | Admitting: Nurse Practitioner

## 2017-12-14 ENCOUNTER — Encounter: Payer: Self-pay | Admitting: Nurse Practitioner

## 2017-12-14 ENCOUNTER — Inpatient Hospital Stay (HOSPITAL_BASED_OUTPATIENT_CLINIC_OR_DEPARTMENT_OTHER): Payer: Medicare HMO | Admitting: Nurse Practitioner

## 2017-12-14 ENCOUNTER — Encounter: Payer: Self-pay | Admitting: Emergency Medicine

## 2017-12-14 ENCOUNTER — Telehealth: Payer: Self-pay | Admitting: Emergency Medicine

## 2017-12-14 VITALS — BP 60/30 | HR 82 | Temp 98.1°F | Resp 18 | Ht 65.98 in

## 2017-12-14 DIAGNOSIS — K4091 Unilateral inguinal hernia, without obstruction or gangrene, recurrent: Secondary | ICD-10-CM | POA: Diagnosis present

## 2017-12-14 DIAGNOSIS — Z794 Long term (current) use of insulin: Secondary | ICD-10-CM | POA: Diagnosis not present

## 2017-12-14 DIAGNOSIS — Z9841 Cataract extraction status, right eye: Secondary | ICD-10-CM | POA: Diagnosis not present

## 2017-12-14 DIAGNOSIS — Z7982 Long term (current) use of aspirin: Secondary | ICD-10-CM | POA: Diagnosis not present

## 2017-12-14 DIAGNOSIS — L8990 Pressure ulcer of unspecified site, unspecified stage: Secondary | ICD-10-CM | POA: Diagnosis not present

## 2017-12-14 DIAGNOSIS — C9110 Chronic lymphocytic leukemia of B-cell type not having achieved remission: Secondary | ICD-10-CM | POA: Diagnosis present

## 2017-12-14 DIAGNOSIS — M792 Neuralgia and neuritis, unspecified: Secondary | ICD-10-CM | POA: Diagnosis not present

## 2017-12-14 DIAGNOSIS — N182 Chronic kidney disease, stage 2 (mild): Secondary | ICD-10-CM | POA: Diagnosis present

## 2017-12-14 DIAGNOSIS — D638 Anemia in other chronic diseases classified elsewhere: Secondary | ICD-10-CM | POA: Diagnosis present

## 2017-12-14 DIAGNOSIS — N179 Acute kidney failure, unspecified: Secondary | ICD-10-CM | POA: Diagnosis not present

## 2017-12-14 DIAGNOSIS — M1990 Unspecified osteoarthritis, unspecified site: Secondary | ICD-10-CM | POA: Diagnosis not present

## 2017-12-14 DIAGNOSIS — E1136 Type 2 diabetes mellitus with diabetic cataract: Secondary | ICD-10-CM | POA: Diagnosis not present

## 2017-12-14 DIAGNOSIS — R52 Pain, unspecified: Secondary | ICD-10-CM | POA: Diagnosis not present

## 2017-12-14 DIAGNOSIS — Z7401 Bed confinement status: Secondary | ICD-10-CM | POA: Diagnosis not present

## 2017-12-14 DIAGNOSIS — R41841 Cognitive communication deficit: Secondary | ICD-10-CM | POA: Diagnosis not present

## 2017-12-14 DIAGNOSIS — E440 Moderate protein-calorie malnutrition: Secondary | ICD-10-CM

## 2017-12-14 DIAGNOSIS — K219 Gastro-esophageal reflux disease without esophagitis: Secondary | ICD-10-CM | POA: Diagnosis not present

## 2017-12-14 DIAGNOSIS — Z87891 Personal history of nicotine dependence: Secondary | ICD-10-CM

## 2017-12-14 DIAGNOSIS — I959 Hypotension, unspecified: Secondary | ICD-10-CM

## 2017-12-14 DIAGNOSIS — I808 Phlebitis and thrombophlebitis of other sites: Secondary | ICD-10-CM | POA: Diagnosis not present

## 2017-12-14 DIAGNOSIS — L89159 Pressure ulcer of sacral region, unspecified stage: Secondary | ICD-10-CM | POA: Diagnosis not present

## 2017-12-14 DIAGNOSIS — R278 Other lack of coordination: Secondary | ICD-10-CM | POA: Diagnosis not present

## 2017-12-14 DIAGNOSIS — T451X5A Adverse effect of antineoplastic and immunosuppressive drugs, initial encounter: Secondary | ICD-10-CM | POA: Diagnosis not present

## 2017-12-14 DIAGNOSIS — R69 Illness, unspecified: Secondary | ICD-10-CM | POA: Diagnosis not present

## 2017-12-14 DIAGNOSIS — Z9842 Cataract extraction status, left eye: Secondary | ICD-10-CM

## 2017-12-14 DIAGNOSIS — M25512 Pain in left shoulder: Secondary | ICD-10-CM | POA: Diagnosis not present

## 2017-12-14 DIAGNOSIS — E871 Hypo-osmolality and hyponatremia: Secondary | ICD-10-CM | POA: Diagnosis present

## 2017-12-14 DIAGNOSIS — N3000 Acute cystitis without hematuria: Secondary | ICD-10-CM | POA: Diagnosis not present

## 2017-12-14 DIAGNOSIS — Z79899 Other long term (current) drug therapy: Secondary | ICD-10-CM

## 2017-12-14 DIAGNOSIS — Z6824 Body mass index (BMI) 24.0-24.9, adult: Secondary | ICD-10-CM

## 2017-12-14 DIAGNOSIS — L89150 Pressure ulcer of sacral region, unstageable: Secondary | ICD-10-CM | POA: Diagnosis present

## 2017-12-14 DIAGNOSIS — I129 Hypertensive chronic kidney disease with stage 1 through stage 4 chronic kidney disease, or unspecified chronic kidney disease: Secondary | ICD-10-CM | POA: Diagnosis present

## 2017-12-14 DIAGNOSIS — L89616 Pressure-induced deep tissue damage of right heel: Secondary | ICD-10-CM | POA: Diagnosis present

## 2017-12-14 DIAGNOSIS — Z961 Presence of intraocular lens: Secondary | ICD-10-CM | POA: Diagnosis present

## 2017-12-14 DIAGNOSIS — E876 Hypokalemia: Secondary | ICD-10-CM | POA: Diagnosis present

## 2017-12-14 DIAGNOSIS — Z833 Family history of diabetes mellitus: Secondary | ICD-10-CM

## 2017-12-14 DIAGNOSIS — E1122 Type 2 diabetes mellitus with diabetic chronic kidney disease: Secondary | ICD-10-CM | POA: Diagnosis present

## 2017-12-14 DIAGNOSIS — K4090 Unilateral inguinal hernia, without obstruction or gangrene, not specified as recurrent: Secondary | ICD-10-CM | POA: Diagnosis not present

## 2017-12-14 DIAGNOSIS — M2550 Pain in unspecified joint: Secondary | ICD-10-CM | POA: Diagnosis not present

## 2017-12-14 DIAGNOSIS — M25532 Pain in left wrist: Secondary | ICD-10-CM | POA: Diagnosis not present

## 2017-12-14 DIAGNOSIS — I10 Essential (primary) hypertension: Secondary | ICD-10-CM | POA: Diagnosis not present

## 2017-12-14 DIAGNOSIS — R031 Nonspecific low blood-pressure reading: Secondary | ICD-10-CM | POA: Diagnosis not present

## 2017-12-14 DIAGNOSIS — R531 Weakness: Secondary | ICD-10-CM | POA: Diagnosis not present

## 2017-12-14 DIAGNOSIS — K567 Ileus, unspecified: Secondary | ICD-10-CM | POA: Diagnosis not present

## 2017-12-14 DIAGNOSIS — I9589 Other hypotension: Secondary | ICD-10-CM | POA: Diagnosis not present

## 2017-12-14 DIAGNOSIS — K4030 Unilateral inguinal hernia, with obstruction, without gangrene, not specified as recurrent: Secondary | ICD-10-CM | POA: Diagnosis present

## 2017-12-14 DIAGNOSIS — R2681 Unsteadiness on feet: Secondary | ICD-10-CM | POA: Diagnosis not present

## 2017-12-14 DIAGNOSIS — M6281 Muscle weakness (generalized): Secondary | ICD-10-CM | POA: Diagnosis not present

## 2017-12-14 LAB — CBC WITH DIFFERENTIAL (CANCER CENTER ONLY)
Abs Immature Granulocytes: 0.04 10*3/uL (ref 0.00–0.07)
BASOS ABS: 0 10*3/uL (ref 0.0–0.1)
BASOS PCT: 0 %
EOS PCT: 2 %
Eosinophils Absolute: 0.2 10*3/uL (ref 0.0–0.5)
HCT: 29.7 % — ABNORMAL LOW (ref 39.0–52.0)
HEMOGLOBIN: 9.1 g/dL — AB (ref 13.0–17.0)
Immature Granulocytes: 0 %
LYMPHS PCT: 47 %
Lymphs Abs: 4.5 10*3/uL — ABNORMAL HIGH (ref 0.7–4.0)
MCH: 26.3 pg (ref 26.0–34.0)
MCHC: 30.6 g/dL (ref 30.0–36.0)
MCV: 85.8 fL (ref 80.0–100.0)
MONO ABS: 0.4 10*3/uL (ref 0.1–1.0)
Monocytes Relative: 4 %
NRBC: 0 % (ref 0.0–0.2)
Neutro Abs: 4.5 10*3/uL (ref 1.7–7.7)
Neutrophils Relative %: 47 %
PLATELETS: 265 10*3/uL (ref 150–400)
RBC: 3.46 MIL/uL — AB (ref 4.22–5.81)
RDW: 16.2 % — ABNORMAL HIGH (ref 11.5–15.5)
WBC: 9.5 10*3/uL (ref 4.0–10.5)

## 2017-12-14 LAB — URINALYSIS, ROUTINE W REFLEX MICROSCOPIC
Bilirubin Urine: NEGATIVE
Glucose, UA: NEGATIVE mg/dL
Ketones, ur: NEGATIVE mg/dL
Nitrite: NEGATIVE
Protein, ur: NEGATIVE mg/dL
Specific Gravity, Urine: 1.011 (ref 1.005–1.030)
WBC, UA: >50 WBC/hpf — ABNORMAL HIGH (ref 0–5)
pH: 5 (ref 5.0–8.0)

## 2017-12-14 LAB — CMP (CANCER CENTER ONLY)
ALT: 14 U/L (ref 0–44)
AST: 21 U/L (ref 15–41)
Albumin: 2 g/dL — ABNORMAL LOW (ref 3.5–5.0)
Alkaline Phosphatase: 83 U/L (ref 38–126)
Anion gap: 11 (ref 5–15)
BUN: 37 mg/dL — ABNORMAL HIGH (ref 8–23)
CHLORIDE: 97 mmol/L — AB (ref 98–111)
CO2: 25 mmol/L (ref 22–32)
CREATININE: 1.47 mg/dL — AB (ref 0.61–1.24)
Calcium: 8.4 mg/dL — ABNORMAL LOW (ref 8.9–10.3)
GFR, EST AFRICAN AMERICAN: 48 mL/min — AB (ref >60)
GFR, EST NON AFRICAN AMERICAN: 42 mL/min — AB (ref >60)
Glucose, Bld: 134 mg/dL — ABNORMAL HIGH (ref 70–99)
POTASSIUM: 4.8 mmol/L (ref 3.5–5.1)
Sodium: 133 mmol/L — ABNORMAL LOW (ref 135–145)
TOTAL PROTEIN: 6.2 g/dL — AB (ref 6.5–8.1)

## 2017-12-14 LAB — LACTATE DEHYDROGENASE: LDH: 124 U/L (ref 98–192)

## 2017-12-14 MED ORDER — SODIUM CHLORIDE 0.9 % IV SOLN
INTRAVENOUS | Status: DC
  Administered 2017-12-14: 21:00:00 via INTRAVENOUS

## 2017-12-14 MED ORDER — SODIUM CHLORIDE 0.9 % IV SOLN
1.0000 g | Freq: Once | INTRAVENOUS | Status: AC
  Administered 2017-12-14: 1 g via INTRAVENOUS
  Filled 2017-12-14: qty 10

## 2017-12-14 MED ORDER — SODIUM CHLORIDE 0.9 % IV BOLUS
500.0000 mL | Freq: Once | INTRAVENOUS | Status: AC
  Administered 2017-12-14: 500 mL via INTRAVENOUS

## 2017-12-14 NOTE — ED Notes (Signed)
Coming from cancer center, states recently discharged from hospital after spending 1 month for a resection-states today he is hypotensive-bringing to the ED for eval

## 2017-12-14 NOTE — Progress Notes (Signed)
Patient transferred to ED via wheel chair. Report given to [facility] via phone and intake RN in room at bedside.

## 2017-12-14 NOTE — Progress Notes (Addendum)
Michael [name]   Diagnosis:  CLL  INTERVAL HISTORY:   Michael [name] returns for follow-up.  He is on ibrutinib for CLL.  He was admitted 11/10/2017 for left inguinal hernia repair.  He developed significant swelling in the left inguinal region following surgery.  He underwent a CT scan which showed that he had reincarcerated small bowel in the inguinal area.  He was taken back to the operating room for repair.  He subsequently developed an ileus and acute renal failure.  Renal function slowly improved as did bowel function.  He was ultimately discharged to a skilled nursing facility 12/05/2017.  He reports poor oral intake.  He is concerned regarding weight loss.  He feels dizzy and lightheaded with standing.  No nausea or vomiting.  No diarrhea.  Blood sugars have been in the 250-260 range.  He denies fever and chills.  No rash.  He complains of left arm and shoulder pain.  We contacted the nursing facility where he is currently residing regarding his low blood pressure upon arrival to our office.  Earlier today they report a blood pressure of 84/47, yesterday 71/36.  Objective:  Vital signs in last 24 hours:  Blood pressure (!) 60/30, pulse 82, temperature 98.1 F (36.7 C), temperature source Oral, resp. rate 18, height 5' 5.98" (1.676 m), SpO2 98 %.    HEENT: No thrush or ulcers. Resp: Lungs are clear. Cardio: Distant heart sounds, seems regular. GI: Abdomen is soft and nontender.  No hepatosplenomegaly. Vascular: Trace edema at the lower legs bilaterally.  Superficial phlebitis left forearm. Neuro: Alert and oriented.  Follows commands. Skin: Left inguinal incision is healed.   Lab Results:  Lab Results  Component Value Date   WBC 9.5 12/14/2017   HGB 9.1 (L) 12/14/2017   HCT 29.7 (L) 12/14/2017   MCV 85.8 12/14/2017   PLT 265 12/14/2017   NEUTROABS 4.5 12/14/2017    Imaging:  No results found.  Medications: I have reviewed the  patient's current medications.  Assessment/Plan: 1. CLL currently on ibrutinib 2. Left inguinal hernia repair 11/10/2017;  CT scan 11/15/2017 with large recurrent left inguinal hernia with extension to multiple small bowel loops into the inguinal canal and scrotum on the left; proximal small bowel dilatation noted consistent with the degree of incarceration; 11/15/2017 he returned to the OR for left inguinal hernia repair with mesh, small bowel resection.  Postoperative course complicated by ileus and acute renal failure.  Discharged to skilled nursing facility 12/05/2017. 3. Severe hypotension 4. Superficial phlebitis left arm  Disposition: Michael [name] is an 84 year old man with CLL currently on ibrutinib.  He is followed by Dr. Alvy Bimler.  He is currently residing at a skilled nursing facility following left inguinal hernia repair approximately 1 month ago.  He presents to our office today for routine follow-up and is found to have severe hypotension.  Creatinine is elevated.  He appears to have superficial phlebitis of the left arm.  We are referring him to the emergency department for evaluation.  Recommend to place ibrutinib on hold at this time.  Patient seen with Dr. Benay Spice.  25 minutes were spent face-to-face at today's visit with the majority of that time involved in counseling/coordination of care.  Ned Card ANP/GNP-BC   12/14/2017  3:26 PM This was a shared visit with Ned Card.  Michael [name] was interviewed and examined.  He presents today with failure to thrive and hypotension.  He will be referred to the emergency  room for further evaluation.  He appears to have superficial phlebitis at the left arm.  Ibrutinib will be placed on hold.  We will schedule follow-up with Dr. Alvy Bimler.  Julieanne Manson, MD

## 2017-12-14 NOTE — ED Provider Notes (Signed)
[facility] DEPT Provider Note   CSN: [id number] Arrival date & time: 12/14/17  7494     History   Chief Complaint Chief Complaint  Patient presents with  . Hypotensive    HPI Jocob Dambach is a 84 y.o. male.  HPI   84 year old male sent from the cancer center for further evaluation of hypotension.  He presented there with a systolic blood pressure in the 60s.  He was given IV fluids referred to the emergency room for further evaluation.  He denies any fevers.  No chills.  He does feel somewhat tired.  Generally weak.  He states that this is chronic but worsening over the past several days.  Past Medical History:  Diagnosis Date  . Anemia   . Anginal pain (HCC)    upon exertion  . Arthritis    "left shoulder, lower back" (10/20/2016)  . CLL (chronic lymphoblastic leukemia) dx'd 2000  . Edema leg   . Family history of adverse reaction to anesthesia    "daughter's BP drops"   . GERD (gastroesophageal reflux disease)   . History of hiatal hernia   . Leukemia, chronic lymphoid ([place]) 12/08/2010  . Lymphocytosis   . Type II diabetes mellitus [facility])     Patient Active Problem List   Diagnosis Date Noted  . Pressure injury of skin 11/22/2017  . Ileus ([place]) 11/14/2017  . S/P left inguinal hernia repair 11/10/2017  . Weight loss, non-intentional 04/12/2017  . Essential hypertension 10/20/2016  . Paresthesias 10/19/2016  . Left inguinal hernia 10/12/2016  . Protein-calorie malnutrition, mild ([place]) 01/10/2016  . Diarrhea due to drug 06/17/2015  . Chronic left shoulder pain 06/17/2015  . Bilateral leg edema 06/17/2015  . Pancytopenia, acquired ([place]) 04/22/2015  . Thrombocytopenia ([place]) 10/11/2014  . 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 CLL (chronic lymphocytic leukemia) ([place]) 12/08/2010    Past Surgical History:  Procedure Laterality Date  . BOWEL RESECTION N/A 11/15/2017   Procedure: SMALL BOWEL RESECTION;  Surgeon: Coralie Keens, MD;  Location: [facility];  Service: General;  Laterality: N/A;  . CATARACT EXTRACTION W/ INTRAOCULAR LENS  IMPLANT, BILATERAL Bilateral   . EYE SURGERY     Cataracts (bilateral)  . GROIN DISSECTION Left 11/15/2017   Procedure: GROIN EXPLORATION WITH REMOVAL OF INGUINAL HERNIA MESH;  Surgeon: Coralie Keens, MD;  Location: [place];  Service: General;  Laterality: Left;  . INGUINAL HERNIA REPAIR Left 11/10/2017   Procedure: LEFT INGUINAL HERNIA REPAIR ERAS PATHWAY;  Surgeon: Coralie Keens, MD;  Location: [place];  Service: General;  Laterality: Left;  . INGUINAL HERNIA REPAIR Left 11/15/2017   Procedure: LEFT INGUINAL HERNIA REPAIR WITH MESH;  Surgeon: Coralie Keens, MD;  Location: [place];  Service: General;  Laterality: Left;  . INSERTION OF MESH Left 11/10/2017   Procedure: INSERTION OF MESH;  Surgeon: Coralie Keens, MD;  Location: [place];  Service: General;  Laterality: Left;        Home Medications    Prior to Admission medications   Medication Sig Start Date End Date Taking? Authorizing Provider  acetaminophen (TYLENOL) 650 MG CR tablet Take 1,300 mg by mouth 2 (two) times daily as needed for pain.   Yes [provider]  aspirin EC 81 MG tablet Take 81 mg by mouth daily.   Yes [provider]  Cholecalciferol (VITAMIN D3) 1000 units CAPS Take 1,000 Units by mouth 2 (two) times daily.    Yes [provider]  collagenase (SANTYL) ointment  Apply 1 application topically daily.   Yes [provider]  Cyanocobalamin (VITAMIN B 12 PO) Take 1,000 mcg by mouth daily.   Yes [provider]  docusate sodium (COLACE) 100 MG capsule Take 100 mg by mouth 2 (two) times daily as needed for mild constipation.   Yes [provider]  gabapentin (NEURONTIN) 100 MG capsule Take 100 mg by mouth 3 (three) times daily.   Yes [provider]  insulin NPH-regular Human (70-30) 100 UNIT/ML injection Inject 15 Units into the skin 2 (two)  times daily with a meal.   Yes [provider]  Polyethyl Glycol-Propyl Glycol (SYSTANE OP) Place 1 drop into both eyes 2 (two) times daily.   Yes [provider]  tamsulosin (FLOMAX) 0.4 MG CAPS capsule Take 0.4 mg by mouth daily.   Yes [provider]  Capsaicin (ASPERCREME PAIN RELIEF PATCH EX) Apply 1 patch topically daily. Apply 1 patch to left shoulder 12 hrs/day. On at 8 am, off at 8 pm    [provider]  IMBRUVICA 140 MG capsul TAKE 2 CAPSULES (280 MG TOTAL) BY MOUTH DAILY. Patient not taking: No sig reported 10/18/17   Heath Lark, MD  oxyCODONE (OXY IR/ROXICODONE) 5 MG immediate release tablet Take 1 tablet (5 mg total) by mouth every 6 (six) hours as needed for moderate pain, severe pain or breakthrough pain. Patient not taking: Reported on 12/14/2017 11/12/17   Coralie Keens, MD  pantoprazole (PROTONIX) 40 MG tablet Take 1 tablet (40 mg total) by mouth daily. Patient not taking: Reported on 12/14/2017 04/10/16   Heath Lark, MD  insulin aspart protamine-insulin aspart (NOVOLOG 70/30) (70-30) 100 UNIT/ML injection Inject 35 Units into the skin 2 (two) times daily with a meal.   04/07/11  [provider]  sitaGLIPtin (JANUVIA) 100 MG tablet Take 100 mg by mouth daily.    04/07/11  [provider]    Family History Family History  Problem Relation Age of Onset  . Cancer Mother   . Hypertension Father   . Diabetes Father     Social History Social History   Tobacco Use  . Smoking status: Never Smoker  . Smokeless tobacco: Former Systems developer    Types: Snuff  Substance Use Topics  . Alcohol use: Not Currently  . Drug use: No     Allergies   Patient has no known allergies.   Review of Systems Review of Systems  All systems reviewed and negative, other than as noted in HPI.  Physical Exam Updated Vital Signs BP (!) 105/48   Pulse 62   Temp (!) 97.5 F (36.4 C) (Oral)   Resp 14   Wt 70.3 kg   SpO2 99%   BMI 25.03  kg/m   Physical Exam  Constitutional: He appears well-developed and well-nourished. No distress.  HENT:  Head: Normocephalic and atraumatic.  Eyes: Conjunctivae are normal. Right eye exhibits no discharge. Left eye exhibits no discharge.  Neck: Neck supple.  Cardiovascular: Normal rate, regular rhythm and normal heart sounds. Exam reveals no gallop and no friction rub.  No murmur heard. Pulmonary/Chest: Effort normal and breath sounds normal. No respiratory distress.  Abdominal: Soft. He exhibits no distension. There is no tenderness.  Musculoskeletal: He exhibits no edema or tenderness.  Neurological: He is alert.  Skin: Skin is warm and dry.  Psychiatric: He has a normal mood and affect. His behavior is normal. Thought content normal.  Nursing note and vitals reviewed.    ED Treatments /  Results  Labs (all labs ordered are listed, but only abnormal results are displayed) Labs Reviewed  URINE CULTURE - Abnormal; Notable for the following components:      Result Value   Culture MULTIPLE SPECIES PRESENT, SUGGEST RECOLLECTION (*)    All other components within normal limits  URINALYSIS, ROUTINE W REFLEX MICROSCOPIC - Abnormal; Notable for the following components:   APPearance HAZY (*)    Hgb urine dipstick SMALL (*)    Leukocytes, UA LARGE (*)    WBC, UA >50 (*)    Bacteria, UA FEW (*)    All other components within normal limits  COMPREHENSIVE METABOLIC PANEL - Abnormal; Notable for the following components:   Glucose, Bld 108 (*)    BUN 31 (*)    Calcium 7.8 (*)    Total Protein 5.2 (*)    Albumin 1.9 (*)    All other components within normal limits  CBC - Abnormal; Notable for the following components:   RBC 3.04 (*)    Hemoglobin 8.1 (*)    HCT 26.7 (*)    RDW 16.3 (*)    All other components within normal limits  GLUCOSE, CAPILLARY - Abnormal; Notable for the following components:   Glucose-Capillary 170 (*)    All other components within normal limits  CBC -  Abnormal; Notable for the following components:   RBC 3.23 (*)    Hemoglobin 8.4 (*)    HCT 28.5 (*)    MCHC 29.5 (*)    RDW 16.0 (*)    All other components within normal limits  BASIC METABOLIC PANEL - Abnormal; Notable for the following components:   Glucose, Bld 136 (*)    Creatinine, Ser 0.57 (*)    Calcium 8.0 (*)    All other components within normal limits  GLUCOSE, CAPILLARY - Abnormal; Notable for the following components:   Glucose-Capillary 130 (*)    All other components within normal limits  GLUCOSE, CAPILLARY - Abnormal; Notable for the following components:   Glucose-Capillary 126 (*)    All other components within normal limits  GLUCOSE, CAPILLARY - Abnormal; Notable for the following components:   Glucose-Capillary 167 (*)    All other components within normal limits  CBC - Abnormal; Notable for the following components:   RBC 3.34 (*)    Hemoglobin 8.6 (*)    HCT 29.1 (*)    MCH 25.7 (*)    MCHC 29.6 (*)    RDW 15.9 (*)    All other components within normal limits  COMPREHENSIVE METABOLIC PANEL - Abnormal; Notable for the following components:   Glucose, Bld 146 (*)    Calcium 8.2 (*)    Total Protein 5.3 (*)    Albumin 1.9 (*)    All other components within normal limits  GLUCOSE, CAPILLARY - Abnormal; Notable for the following components:   Glucose-Capillary 115 (*)    All other components within normal limits  GLUCOSE, CAPILLARY - Abnormal; Notable for the following components:   Glucose-Capillary 130 (*)    All other components within normal limits  GLUCOSE, CAPILLARY - Abnormal; Notable for the following components:   Glucose-Capillary 138 (*)    All other components within normal limits  GLUCOSE, CAPILLARY - Abnormal; Notable for the following components:   Glucose-Capillary 147 (*)    All other components within normal limits  CBC - Abnormal; Notable for the following components:   RBC 3.08 (*)    Hemoglobin 8.2 (*)    HCT  27.6 (*)    MCHC  29.7 (*)    RDW 16.2 (*)    All other components within normal limits  BASIC METABOLIC PANEL - Abnormal; Notable for the following components:   Glucose, Bld 120 (*)    Calcium 8.1 (*)    All other components within normal limits  GLUCOSE, CAPILLARY - Abnormal; Notable for the following components:   Glucose-Capillary 139 (*)    All other components within normal limits  GLUCOSE, CAPILLARY - Abnormal; Notable for the following components:   Glucose-Capillary 218 (*)    All other components within normal limits  GLUCOSE, CAPILLARY - Abnormal; Notable for the following components:   Glucose-Capillary 158 (*)    All other components within normal limits  CBC - Abnormal; Notable for the following components:   RBC 3.26 (*)    Hemoglobin 8.4 (*)    HCT 28.4 (*)    MCH 25.8 (*)    MCHC 29.6 (*)    RDW 16.1 (*)    All other components within normal limits  BASIC METABOLIC PANEL - Abnormal; Notable for the following components:   Calcium 8.3 (*)    All other components within normal limits  MAGNESIUM - Abnormal; Notable for the following components:   Magnesium 1.6 (*)    All other components within normal limits  GLUCOSE, CAPILLARY - Abnormal; Notable for the following components:   Glucose-Capillary 157 (*)    All other components within normal limits  GLUCOSE, CAPILLARY - Abnormal; Notable for the following components:   Glucose-Capillary 164 (*)    All other components within normal limits  CBC - Abnormal; Notable for the following components:   RBC 3.26 (*)    Hemoglobin 8.4 (*)    HCT 28.2 (*)    MCH 25.8 (*)    MCHC 29.8 (*)    RDW 15.9 (*)    All other components within normal limits  BASIC METABOLIC PANEL - Abnormal; Notable for the following components:   Glucose, Bld 119 (*)    Calcium 8.3 (*)    All other components within normal limits  GLUCOSE, CAPILLARY - Abnormal; Notable for the following components:   Glucose-Capillary 100 (*)    All other components within  normal limits  GLUCOSE, CAPILLARY - Abnormal; Notable for the following components:   Glucose-Capillary 136 (*)    All other components within normal limits  GLUCOSE, CAPILLARY - Abnormal; Notable for the following components:   Glucose-Capillary 133 (*)    All other components within normal limits  GLUCOSE, CAPILLARY - Abnormal; Notable for the following components:   Glucose-Capillary 204 (*)    All other components within normal limits  GLUCOSE, CAPILLARY - Abnormal; Notable for the following components:   Glucose-Capillary 157 (*)    All other components within normal limits  CBC - Abnormal; Notable for the following components:   RBC 3.06 (*)    Hemoglobin 7.8 (*)    HCT 26.8 (*)    MCH 25.5 (*)    MCHC 29.1 (*)    RDW 15.9 (*)    All other components within normal limits  COMPREHENSIVE METABOLIC PANEL - Abnormal; Notable for the following components:   Glucose, Bld 142 (*)    Creatinine, Ser 0.60 (*)    Calcium 8.1 (*)    Total Protein 5.1 (*)    Albumin 1.8 (*)    All other components within normal limits  GLUCOSE, CAPILLARY - Abnormal; Notable for the following components:  Glucose-Capillary 159 (*)    All other components within normal limits  GLUCOSE, CAPILLARY - Abnormal; Notable for the following components:   Glucose-Capillary 137 (*)    All other components within normal limits  GLUCOSE, CAPILLARY - Abnormal; Notable for the following components:   Glucose-Capillary 201 (*)    All other components within normal limits  C-REACTIVE PROTEIN - Abnormal; Notable for the following components:   CRP 14.0 (*)    All other components within normal limits  SEDIMENTATION RATE - Abnormal; Notable for the following components:   Sed Rate 84 (*)    All other components within normal limits  GLUCOSE, CAPILLARY - Abnormal; Notable for the following components:   Glucose-Capillary 126 (*)    All other components within normal limits  GLUCOSE, CAPILLARY - Abnormal; Notable  for the following components:   Glucose-Capillary 109 (*)    All other components within normal limits  TSH  MAGNESIUM  GLUCOSE, CAPILLARY  MAGNESIUM  MAGNESIUM  GLUCOSE, CAPILLARY  GLUCOSE, CAPILLARY  MAGNESIUM  MAGNESIUM  CBC  COMPREHENSIVE METABOLIC PANEL  MAGNESIUM  CBG MONITORING, ED    EKG None  Radiology No results found.  Procedures Procedures (including critical care time)  Medications Ordered in ED Medications  cefTRIAXone (ROCEPHIN) 1 g in sodium chloride 0.9 % 100 mL IVPB (has no administration in time range)  0.9 %  sodium chloride infusion (has no administration in time range)  sodium chloride 0.9 % bolus 500 mL (0 mLs Intravenous Stopped 12/14/17 1917)     Initial Impression / Assessment and Plan / ED Course  I have reviewed the triage vital signs and the nursing notes.  Pertinent labs & imaging results that were available during my care of the patient were reviewed by me and considered in my medical decision making (see chart for details).     84 year old male with persistent hypotension.  Continue IV fluids.  He does have a UTI but I do not think that this is sepsis.  He was given antibiotics.  Admission for further evaluation/treatment. Final Clinical Impressions(s) / ED Diagnoses   Final diagnoses:  Decubitus skin ulcer    ED Discharge Orders    None       Virgel Manifold, MD 12/22/17 [id number]

## 2017-12-14 NOTE — ED Triage Notes (Signed)
Patient arrived from cancer center. Patient has a creatinine 1.47. Patient is hypotensive, reported 60/30 per cancer center. Pt has phlebitis on the left arm.   Pt has leukemia.

## 2017-12-14 NOTE — H&P (Signed)
History and Physical   Michael [name] GEX:[id number] DOB: 1930/04/03 DOA: 12/14/2017  Referring MD/NP/PA: Dr. Wilson Singer  PCP: Jani Gravel, MD   Outpatient Specialists: Heath Lark, cancer center  Patient coming from: Skilled nursing facility via cancer center  Chief Complaint: Low blood pressure  HPI: Michael [name] is a 84 y.o. male with medical history significant of CLL currently on treatment with ibrutinib, recent admission to the hospital for incarcerated left inguinal hernia from 11/10/2017 to 01/28/7251 with complications including re-incarceration with postoperative ileus and kidney failure,.  History of pancytopenia secondary to chemotherapy, history of skin ulcers who came to [facility] for follow-up from the nursing facility today.  While there he was found to have systolic blood pressure in the 60s.  No fever or chills no obvious evidence of sepsis.  Patient was sent to the ER for further evaluation.  He has been given multiple doses of IV fluids but still remains mildly hypotensive.  Systolic has finally reached 106 at the moment.  Patient noted to have hyponatremia with hypokalemia.  His creatinine is still elevated.  He reported poor oral intake since going to the facility a week ago.  He feels weak and tired as well as debilitated.  Patient is being admitted for evaluation of these hypotension.  ED Course: Temperature was 97 5 initial blood pressure 60/30 currently 106/44 pulse is 56 respiratory rate of 22 oxygen sat 90%.  White count is 9.5 hemoglobin 9.1 and platelet 265.  Sodium 133 potassium 4.8 chloride 97 CO2 25 BUN 37 creatinine 1.47 and calcium 8.4 glucose is 134.  Urinalysis is essentially normal and EKG showed no significant findings.  Patient received boluses of normal saline and Ringer's lactate and he is being admitted for further evaluation.  Review of Systems: As per HPI otherwise 10 point review of systems negative.    Past Medical History:  Diagnosis Date  .  Anemia   . Anginal pain (HCC)    upon exertion  . Arthritis    "left shoulder, lower back" (10/20/2016)  . CLL (chronic lymphoblastic leukemia) dx'd 2000  . Edema leg   . Family history of adverse reaction to anesthesia    "daughter's BP drops"   . GERD (gastroesophageal reflux disease)   . History of hiatal hernia   . Leukemia, chronic lymphoid ([place]) 12/08/2010  . Lymphocytosis   . Type II diabetes mellitus ([place])     Past Surgical History:  Procedure Laterality Date  . BOWEL RESECTION N/A 11/15/2017   Procedure: SMALL BOWEL RESECTION;  Surgeon: Coralie Keens, MD;  Location: [facility];  Service: General;  Laterality: N/A;  . CATARACT EXTRACTION W/ INTRAOCULAR LENS  IMPLANT, BILATERAL Bilateral   . EYE SURGERY     Cataracts (bilateral)  . GROIN DISSECTION Left 11/15/2017   Procedure: GROIN EXPLORATION WITH REMOVAL OF INGUINAL HERNIA MESH;  Surgeon: Coralie Keens, MD;  Location: [facility];  Service: General;  Laterality: Left;  . INGUINAL HERNIA REPAIR Left 11/10/2017   Procedure: LEFT INGUINAL HERNIA REPAIR ERAS PATHWAY;  Surgeon: Coralie Keens, MD;  Location: [facility];  Service: General;  Laterality: Left;  . INGUINAL HERNIA REPAIR Left 11/15/2017   Procedure: LEFT INGUINAL HERNIA REPAIR WITH MESH;  Surgeon: Coralie Keens, MD;  Location: [facility];  Service: General;  Laterality: Left;  . INSERTION OF MESH Left 11/10/2017   Procedure: INSERTION OF MESH;  Surgeon: Coralie Keens, MD;  Location: [facility];  Service: General;  Laterality: Left;     reports that he has  never smoked. He quit smokeless tobacco use about 2 years ago.  His smokeless tobacco use included snuff. He reports that he drank alcohol. He reports that he does not use drugs.  No Known Allergies  Family History  Problem Relation Age of Onset  . Cancer Mother   . Hypertension Father   . Diabetes Father      Prior to Admission medications   Medication Sig Start Date End Date Taking? Authorizing Provider    acetaminophen (TYLENOL) 650 MG CR tablet Take 1,300 mg by mouth 2 (two) times daily as needed for pain.   Yes [provider]  aspirin EC 81 MG tablet Take 81 mg by mouth daily.   Yes [provider]  Cholecalciferol (VITAMIN D3) 1000 units CAPS Take 1,000 Units by mouth 2 (two) times daily.    Yes [provider]  collagenase (SANTYL) ointment Apply 1 application topically daily.   Yes [provider]  Cyanocobalamin (VITAMIN B 12 PO) Take 1,000 mcg by mouth daily.   Yes [provider]  docusate sodium (COLACE) 100 MG capsule Take 100 mg by mouth 2 (two) times daily as needed for mild constipation.   Yes [provider]  gabapentin (NEURONTIN) 100 MG capsule Take 100 mg by mouth 3 (three) times daily.   Yes [provider]  insulin NPH-regular Human (70-30) 100 UNIT/ML injection Inject 15 Units into the skin 2 (two) times daily with a meal.   Yes [provider]  Polyethyl Glycol-Propyl Glycol (SYSTANE OP) Place 1 drop into both eyes 2 (two) times daily.   Yes [provider]  tamsulosin (FLOMAX) 0.4 MG CAPS capsule Take 0.4 mg by mouth daily.   Yes [provider]  Capsaicin (ASPERCREME PAIN RELIEF PATCH EX) Apply 1 patch topically daily. Apply 1 patch to left shoulder 12 hrs/day. On at 8 am, off at 8 pm    [provider]  IMBRUVICA 140 MG capsul TAKE 2 CAPSULES (280 MG TOTAL) BY MOUTH DAILY. Patient not taking: No sig reported 10/18/17   Heath Lark, MD  oxyCODONE (OXY IR/ROXICODONE) 5 MG immediate release tablet Take 1 tablet (5 mg total) by mouth every 6 (six) hours as needed for moderate pain, severe pain or breakthrough pain. Patient not taking: Reported on 12/14/2017 11/12/17   Coralie Keens, MD  pantoprazole (PROTONIX) 40 MG tablet Take 1 tablet (40 mg total) by mouth daily. Patient not taking: Reported on 12/14/2017 04/10/16   Heath Lark, MD  insulin aspart protamine-insulin aspart  (NOVOLOG 70/30) (70-30) 100 UNIT/ML injection Inject 35 Units into the skin 2 (two) times daily with a meal.   04/07/11  [provider]  sitaGLIPtin (JANUVIA) 100 MG tablet Take 100 mg by mouth daily.    04/07/11  [provider]    Physical Exam: Vitals:   12/14/17 2015 12/14/17 2030 12/14/17 2045 12/14/17 2100  BP: (!) 89/51 (!) 90/45 (!) 104/48 (!) 125/54  Pulse: 66 65 65 72  Resp: 16 17 16 16   Temp:      TempSrc:      SpO2: 100% 98% 100% 100%  Weight:          Constitutional: NAD, calm, comfortable Vitals:   12/14/17 2015 12/14/17 2030 12/14/17 2045 12/14/17 2100  BP: (!) 89/51 (!) 90/45 (!) 104/48 (!) 125/54  Pulse: 66 65 65 72  Resp: 16 17 16 16   Temp:      TempSrc:      SpO2: 100% 98% 100%  100%  Weight:       Eyes: PERRL, lids and conjunctivae normal ENMT: Mucous membranes are dry. Posterior pharynx clear of any exudate or lesions.Normal dentition.  Neck: normal, supple, no masses, no thyromegaly Respiratory: clear to auscultation bilaterally, no wheezing, no crackles. Normal respiratory effort. No accessory muscle use.  Cardiovascular: Regular rate and rhythm, no murmurs / rubs / gallops. No extremity edema. 2+ pedal pulses. No carotid bruits.  Abdomen: no tenderness, no masses palpated. No hepatosplenomegaly. Bowel sounds positive.  Musculoskeletal: no clubbing / cyanosis. No joint deformity upper and lower extremities. Good ROM, no contractures. Normal muscle tone.  Skin: no rashes, lesions, ulcers. No induration Neurologic: CN 2-12 grossly intact. Sensation intact, DTR normal. Strength 5/5 in all 4.  Psychiatric: Normal judgment and insight. Alert and oriented x 3. Normal mood.     Labs on Admission: I have personally reviewed following labs and imaging studies  CBC: Recent Labs  Lab 12/14/17 1448  WBC 9.5  NEUTROABS 4.5  HGB 9.1*  HCT 29.7*  MCV 85.8  PLT 867   Basic Metabolic Panel: Recent Labs  Lab 12/14/17 1448  NA 133*  K  4.8  CL 97*  CO2 25  GLUCOSE 134*  BUN 37*  CREATININE 1.47*  CALCIUM 8.4*   GFR: Estimated Creatinine Clearance: 33.2 mL/min (A) (by C-G formula based on SCr of 1.47 mg/dL (H)). Liver Function Tests: Recent Labs  Lab 12/14/17 1448  AST 21  ALT 14  ALKPHOS 83  BILITOT <0.2*  PROT 6.2*  ALBUMIN 2.0*   No results for input(s): LIPASE, AMYLASE in the last 168 hours. No results for input(s): AMMONIA in the last 168 hours. Coagulation Profile: No results for input(s): INR, PROTIME in the last 168 hours. Cardiac Enzymes: No results for input(s): CKTOTAL, CKMB, CKMBINDEX, TROPONINI in the last 168 hours. BNP (last 3 results) No results for input(s): PROBNP in the last 8760 hours. HbA1C: No results for input(s): HGBA1C in the last 72 hours. CBG: No results for input(s): GLUCAP in the last 168 hours. Lipid Profile: No results for input(s): CHOL, HDL, LDLCALC, TRIG, CHOLHDL, LDLDIRECT in the last 72 hours. Thyroid Function Tests: No results for input(s): TSH, T4TOTAL, FREET4, T3FREE, THYROIDAB in the last 72 hours. Anemia Panel: No results for input(s): VITAMINB12, FOLATE, FERRITIN, TIBC, IRON, RETICCTPCT in the last 72 hours. Urine analysis:    Component Value Date/Time   COLORURINE YELLOW 12/14/2017 1725   APPEARANCEUR HAZY (A) 12/14/2017 1725   LABSPEC 1.011 12/14/2017 1725   PHURINE 5.0 12/14/2017 1725   GLUCOSEU NEGATIVE 12/14/2017 1725   HGBUR SMALL (A) 12/14/2017 1725   BILIRUBINUR NEGATIVE 12/14/2017 [place] 12/14/2017 1725   PROTEINUR NEGATIVE 12/14/2017 1725   UROBILINOGEN 0.2 02/16/2007 0834   NITRITE NEGATIVE 12/14/2017 1725   LEUKOCYTESUR LARGE (A) 12/14/2017 1725   Sepsis Labs: @LABRCNTIP (procalcitonin:4,lacticidven:4) )No results found for this or any previous visit (from the past 240 hour(s)).   Radiological Exams on Admission: No results found.  EKG: Independently reviewed.  It shows sinus rhythm with a rate of 72.  No significant  ST changes  Assessment/Plan Principal Problem:   Hypotension Active Problems:   CLL (chronic lymphocytic leukemia) (HCC)   Essential hypertension   Hyponatremia   ARF (acute renal failure) (HCC)     #1 hypotension: Patient's hypotension could be due to poor oral intake and dehydration.  He is responded to fluid boluses at this point.  He appears to have asymptomatic bacteriuria however with  a large leukocyte Estrace negative nitrite.  Patient also appears to have been on some blood pressure medication.  We will admit the patient and aggressively hydrate.  Hold any antihypertensives.  Monitor renal function as well.  Once blood pressure stabilizes will be returned to skilled facility.  #2 acute kidney injury: most likely prerenal.  Continue to monitor renal function.  #3 CLL: Continue treatment per oncology.  #4 hyponatremia: Most likely secondary to dehydration.  Monitor sodium level as we continue with hydration.  #5 possible UTI: At his age has some leukocyte Estrace and few bacteria.  Will empirically treat with Rocephin.  Await urine culture sensitivities.    DVT prophylaxis: Heparin Code Status: Full code Family Communication: No family at bedside Disposition Plan: Back to skilled facility Consults called: None Admission status: Inpatient  Severity of Illness: The appropriate patient status for this patient is INPATIENT. Inpatient status is judged to be reasonable and necessary in order to provide the required intensity of service to ensure the patient's safety. The patient's presenting symptoms, physical exam findings, and initial radiographic and laboratory data in the context of their chronic comorbidities is felt to place them at high risk for further clinical deterioration. Furthermore, it is not anticipated that the patient will be medically stable for discharge from the hospital within 2 midnights of admission. The following factors support the patient status of  inpatient.   " The patient's presenting symptoms include hypotension of weakness. " The worrisome physical exam findings include dry mucous membranes with evidence of recent surgery. " The initial radiographic and laboratory data are worrisome because of increased BUN and creatinine. " The chronic co-morbidities include CLL.   * I certify that at the point of admission it is my clinical judgment that the patient will require inpatient hospital care spanning beyond 2 midnights from the point of admission due to high intensity of service, high risk for further deterioration and high frequency of surveillance required.Barbette Merino MD [facility] Pager [phone]  If 7PM-7AM, please contact night-coverage [email or web address] Password [facility]  12/14/2017, 9:53 PM

## 2017-12-14 NOTE — Telephone Encounter (Signed)
[place] place for past few blood pressures of this patient. His BP today was 84/47 and yesterday it was 71/36. He is 60/30 (manual check) in our office today. Ned Card, NP made aware of all blood pressures.

## 2017-12-15 ENCOUNTER — Telehealth: Payer: Self-pay | Admitting: Hematology and Oncology

## 2017-12-15 ENCOUNTER — Other Ambulatory Visit: Payer: Self-pay

## 2017-12-15 LAB — COMPREHENSIVE METABOLIC PANEL
ALK PHOS: 58 U/L (ref 38–126)
ALT: 15 U/L (ref 0–44)
ANION GAP: 7 (ref 5–15)
AST: 16 U/L (ref 15–41)
Albumin: 1.9 g/dL — ABNORMAL LOW (ref 3.5–5.0)
BUN: 31 mg/dL — AB (ref 8–23)
CHLORIDE: 101 mmol/L (ref 98–111)
CO2: 27 mmol/L (ref 22–32)
Calcium: 7.8 mg/dL — ABNORMAL LOW (ref 8.9–10.3)
Creatinine, Ser: 0.83 mg/dL (ref 0.61–1.24)
GFR calc Af Amer: >60 mL/min (ref >60)
GFR calc non Af Amer: >60 mL/min (ref >60)
GLUCOSE: 108 mg/dL — AB (ref 70–99)
Potassium: 3.9 mmol/L (ref 3.5–5.1)
Sodium: 135 mmol/L (ref 135–145)
Total Bilirubin: 0.4 mg/dL (ref 0.3–1.2)
Total Protein: 5.2 g/dL — ABNORMAL LOW (ref 6.5–8.1)

## 2017-12-15 LAB — GLUCOSE, CAPILLARY
GLUCOSE-CAPILLARY: 130 mg/dL — AB (ref 70–99)
Glucose-Capillary: 170 mg/dL — ABNORMAL HIGH (ref 70–99)

## 2017-12-15 LAB — CBC
HEMATOCRIT: 26.7 % — AB (ref 39.0–52.0)
HEMOGLOBIN: 8.1 g/dL — AB (ref 13.0–17.0)
MCH: 26.6 pg (ref 26.0–34.0)
MCHC: 30.3 g/dL (ref 30.0–36.0)
MCV: 87.8 fL (ref 80.0–100.0)
Platelets: 257 10*3/uL (ref 150–400)
RBC: 3.04 MIL/uL — AB (ref 4.22–5.81)
RDW: 16.3 % — ABNORMAL HIGH (ref 11.5–15.5)
WBC: 7.9 10*3/uL (ref 4.0–10.5)
nRBC: 0 % (ref 0.0–0.2)

## 2017-12-15 LAB — TSH: TSH: 1.324 u[IU]/mL (ref 0.350–4.500)

## 2017-12-15 LAB — CBG MONITORING, ED: Glucose-Capillary: 97 mg/dL (ref 70–99)

## 2017-12-15 MED ORDER — HEPARIN SODIUM (PORCINE) 5000 UNIT/ML IJ SOLN
5000.0000 [IU] | Freq: Three times a day (TID) | INTRAMUSCULAR | Status: DC
  Administered 2017-12-15 – 2017-12-22 (×20): 5000 [IU] via SUBCUTANEOUS
  Filled 2017-12-15 (×21): qty 1

## 2017-12-15 MED ORDER — POLYETHYLENE GLYCOL 3350 17 G PO PACK
17.0000 g | PACK | Freq: Every day | ORAL | Status: DC
  Administered 2017-12-15 – 2017-12-22 (×7): 17 g via ORAL
  Filled 2017-12-15 (×8): qty 1

## 2017-12-15 MED ORDER — INSULIN ASPART PROT & ASPART (70-30 MIX) 100 UNIT/ML ~~LOC~~ SUSP
15.0000 [IU] | Freq: Two times a day (BID) | SUBCUTANEOUS | Status: DC
  Filled 2017-12-15: qty 10

## 2017-12-15 MED ORDER — TAMSULOSIN HCL 0.4 MG PO CAPS
0.4000 mg | ORAL_CAPSULE | Freq: Every day | ORAL | Status: DC
  Administered 2017-12-16 – 2017-12-22 (×7): 0.4 mg via ORAL
  Filled 2017-12-15 (×7): qty 1

## 2017-12-15 MED ORDER — INSULIN ASPART PROT & ASPART (70-30 MIX) 100 UNIT/ML ~~LOC~~ SUSP
10.0000 [IU] | Freq: Two times a day (BID) | SUBCUTANEOUS | Status: DC
  Administered 2017-12-16 – 2017-12-22 (×13): 10 [IU] via SUBCUTANEOUS
  Filled 2017-12-15: qty 10

## 2017-12-15 MED ORDER — VITAMIN D 25 MCG (1000 UNIT) PO TABS
1000.0000 [IU] | ORAL_TABLET | Freq: Two times a day (BID) | ORAL | Status: DC
  Administered 2017-12-15 – 2017-12-22 (×14): 1000 [IU] via ORAL
  Filled 2017-12-15 (×14): qty 1

## 2017-12-15 MED ORDER — ASPIRIN EC 81 MG PO TBEC
81.0000 mg | DELAYED_RELEASE_TABLET | Freq: Every day | ORAL | Status: DC
  Administered 2017-12-16 – 2017-12-22 (×7): 81 mg via ORAL
  Filled 2017-12-15 (×7): qty 1

## 2017-12-15 MED ORDER — LACTATED RINGERS IV SOLN
INTRAVENOUS | Status: DC
  Administered 2017-12-15 – 2017-12-16 (×2): via INTRAVENOUS

## 2017-12-15 MED ORDER — ACETAMINOPHEN 500 MG PO TABS
1000.0000 mg | ORAL_TABLET | Freq: Two times a day (BID) | ORAL | Status: DC | PRN
  Administered 2017-12-15 – 2017-12-17 (×4): 1000 mg via ORAL
  Filled 2017-12-15 (×4): qty 2

## 2017-12-15 MED ORDER — SODIUM CHLORIDE 0.9 % IV SOLN
1.0000 g | INTRAVENOUS | Status: DC
  Administered 2017-12-15: 1 g via INTRAVENOUS
  Filled 2017-12-15: qty 10
  Filled 2017-12-15: qty 1

## 2017-12-15 MED ORDER — VITAMIN B-12 1000 MCG PO TABS
1000.0000 ug | ORAL_TABLET | Freq: Every day | ORAL | Status: DC
  Administered 2017-12-16 – 2017-12-22 (×7): 1000 ug via ORAL
  Filled 2017-12-15 (×7): qty 1

## 2017-12-15 MED ORDER — SODIUM CHLORIDE 0.9 % IV SOLN: INTRAVENOUS | Status: DC

## 2017-12-15 MED ORDER — ONDANSETRON HCL 4 MG PO TABS: 4.0000 mg | ORAL_TABLET | Freq: Four times a day (QID) | ORAL | Status: DC | PRN

## 2017-12-15 MED ORDER — GABAPENTIN 100 MG PO CAPS
100.0000 mg | ORAL_CAPSULE | Freq: Three times a day (TID) | ORAL | Status: DC
  Administered 2017-12-15 – 2017-12-22 (×21): 100 mg via ORAL
  Filled 2017-12-15 (×21): qty 1

## 2017-12-15 MED ORDER — ONDANSETRON HCL 4 MG/2ML IJ SOLN: 4.0000 mg | Freq: Four times a day (QID) | INTRAMUSCULAR | Status: DC | PRN

## 2017-12-15 MED ORDER — INSULIN ASPART 100 UNIT/ML ~~LOC~~ SOLN
0.0000 [IU] | Freq: Three times a day (TID) | SUBCUTANEOUS | Status: DC
  Administered 2017-12-15: 2 [IU] via SUBCUTANEOUS
  Administered 2017-12-16: 1 [IU] via SUBCUTANEOUS
  Administered 2017-12-16: 2 [IU] via SUBCUTANEOUS
  Administered 2017-12-17 (×3): 1 [IU] via SUBCUTANEOUS
  Administered 2017-12-18: 2 [IU] via SUBCUTANEOUS
  Administered 2017-12-18: 1 [IU] via SUBCUTANEOUS
  Administered 2017-12-18: 3 [IU] via SUBCUTANEOUS
  Administered 2017-12-19 (×2): 2 [IU] via SUBCUTANEOUS
  Administered 2017-12-19: 1 [IU] via SUBCUTANEOUS
  Administered 2017-12-20: 2 [IU] via SUBCUTANEOUS
  Administered 2017-12-20: 3 [IU] via SUBCUTANEOUS
  Administered 2017-12-20 – 2017-12-21 (×3): 1 [IU] via SUBCUTANEOUS
  Administered 2017-12-21 – 2017-12-22 (×2): 3 [IU] via SUBCUTANEOUS
  Administered 2017-12-22: 1 [IU] via SUBCUTANEOUS

## 2017-12-15 MED ORDER — DOCUSATE SODIUM 100 MG PO CAPS
100.0000 mg | ORAL_CAPSULE | Freq: Two times a day (BID) | ORAL | Status: DC | PRN
  Administered 2017-12-16: 100 mg via ORAL
  Filled 2017-12-15: qty 1

## 2017-12-15 NOTE — ED Notes (Signed)
ED TO INPATIENT HANDOFF REPORT  Name/Age/Gender Michael [name] 84 y.o. male  Code Status    Code Status Orders  (From admission, onward)         Start     Ordered   12/15/17 0052  Full code  Continuous     12/15/17 0051        Code Status History    Date Active Date Inactive Code Status Order ID Comments User Context   11/10/2017 1637 12/05/2017 1801 Full Code [id number]  Coralie Keens, MD Inpatient   10/20/2016 0200 10/21/2016 2037 Full Code [id number]  [name], MD Inpatient      Home/SNF/Other Home  Chief Complaint hypotensive  Level of Care/Admitting Diagnosis ED Disposition    ED Disposition Condition [facility] Area: [facility] [[id number]]  Level of Care: Telemetry [5]  Admit to tele based on following criteria: Other see comments  Comments: hypotension  Diagnosis: Hypotension [005110]  Admitting Physician: Elwyn Reach [[id number]]  Attending Physician: Elwyn Reach [[id number]]  Estimated length of stay: past midnight tomorrow  Certification:: I certify this patient will need inpatient services for at least 2 midnights  PT Class (Do Not Modify): Inpatient [101]  PT Acc Code (Do Not Modify): Private [1]       Medical History Past Medical History:  Diagnosis Date  . Anemia   . Anginal pain (HCC)    upon exertion  . Arthritis    "left shoulder, lower back" (10/20/2016)  . CLL (chronic lymphoblastic leukemia) dx'd 2000  . Edema leg   . Family history of adverse reaction to anesthesia    "daughter's BP drops"   . GERD (gastroesophageal reflux disease)   . History of hiatal hernia   . Leukemia, chronic lymphoid ([place]) 12/08/2010  . Lymphocytosis   . Type II diabetes mellitus ([place])     Allergies No Known Allergies  IV Location/Drains/Wounds Patient Lines/Drains/Airways Status   Active Line/Drains/Airways    Name:   Placement date:   Placement time:   Site:   Days:   Peripheral IV 12/14/17 Right  Forearm   12/14/17    1653    Forearm   1   External Urinary Catheter   12/03/17    1630    -   12   Incision (Closed) 11/15/17 Groin Left   11/15/17    1646     30   Pressure Injury 11/21/17 Deep Tissue Injury - Purple or maroon localized area of discolored intact skin or blood-filled blister due to damage of underlying soft tissue from pressure and/or shear.   11/21/17    2120     24          Labs/Imaging Results for orders placed or performed during the hospital encounter of 12/14/17 (from the past 48 hour(s))  Urinalysis, Routine w reflex microscopic     Status: Abnormal   Collection Time: 12/14/17  5:25 PM  Result Value Ref Range   Color, Urine YELLOW YELLOW   APPearance HAZY (A) CLEAR   Specific Gravity, Urine 1.011 1.005 - 1.030   pH 5.0 5.0 - 8.0   Glucose, UA NEGATIVE NEGATIVE mg/dL   Hgb urine dipstick SMALL (A) NEGATIVE   Bilirubin Urine NEGATIVE NEGATIVE   Ketones, ur NEGATIVE NEGATIVE mg/dL   Protein, ur NEGATIVE NEGATIVE mg/dL   Nitrite NEGATIVE NEGATIVE   Leukocytes, UA LARGE (A) NEGATIVE   RBC / HPF 0-5 0 - 5 RBC/hpf  WBC, UA >50 (H) 0 - 5 WBC/hpf   Bacteria, UA FEW (A) NONE SEEN   Squamous Epithelial / LPF 0-5 0 - 5   WBC Clumps PRESENT    Mucus PRESENT    Budding Yeast PRESENT    Hyaline Casts, UA PRESENT     Comment: Performed at [facility], [place] [address], [place], [place] [address]  Comprehensive metabolic panel     Status: Abnormal   Collection Time: 12/15/17  4:00 AM  Result Value Ref Range   Sodium 135 135 - 145 mmol/L   Potassium 3.9 3.5 - 5.1 mmol/L   Chloride 101 98 - 111 mmol/L   CO2 27 22 - 32 mmol/L   Glucose, Bld 108 (H) 70 - 99 mg/dL   BUN 31 (H) 8 - 23 mg/dL   Creatinine, Ser 0.83 0.61 - 1.24 mg/dL   Calcium 7.8 (L) 8.9 - 10.3 mg/dL   Total Protein 5.2 (L) 6.5 - 8.1 g/dL   Albumin 1.9 (L) 3.5 - 5.0 g/dL   AST 16 15 - 41 U/L   ALT 15 0 - 44 U/L   Alkaline Phosphatase 58 38 - 126 U/L   Total Bilirubin 0.4 0.3 - 1.2  mg/dL   GFR calc non Af Amer >60 >60 mL/min   GFR calc Af Amer >60 >60 mL/min    Comment: (NOTE) The eGFR has been calculated using the CKD EPI equation. This calculation has not been validated in all clinical situations. eGFR's persistently <60 mL/min signify possible Chronic Kidney Disease.    Anion gap 7 5 - 15    Comment: Performed at [facility], [place] [address] [place], [place] [address]  CBC     Status: Abnormal   Collection Time: 12/15/17  4:00 AM  Result Value Ref Range   WBC 7.9 4.0 - 10.5 K/uL   RBC 3.04 (L) 4.22 - 5.81 MIL/uL   Hemoglobin 8.1 (L) 13.0 - 17.0 g/dL   HCT 26.7 (L) 39.0 - 52.0 %   MCV 87.8 80.0 - 100.0 fL   MCH 26.6 26.0 - 34.0 pg   MCHC 30.3 30.0 - 36.0 g/dL   RDW 16.3 (H) 11.5 - 15.5 %   Platelets 257 150 - 400 K/uL   nRBC 0.0 0.0 - 0.2 %    Comment: Performed at [facility], [place] [address], [place], [place] [address]  TSH     Status: None   Collection Time: 12/15/17  4:00 AM  Result Value Ref Range   TSH 1.324 0.350 - 4.500 uIU/mL    Comment: Performed by a 3rd Generation assay with a functional sensitivity of <=0.01 uIU/mL. Performed at [facility], [place] [address], [place], [place] [address]   CBG monitoring, ED     Status: None   Collection Time: 12/15/17  8:20 AM  Result Value Ref Range   Glucose-Capillary 97 70 - 99 mg/dL   No results found. EKG Interpretation  Date/Time:  Tuesday December 14 2017 17:21:56 EST Ventricular Rate:  73 PR Interval:    QRS Duration: 104 QT Interval:  382 QTC Calculation: 421 R Axis:   45 Text Interpretation:  Sinus rhythm Confirmed by Virgel Manifold [phone]) on 12/14/2017 7:57:44 PM Also confirmed by Virgel Manifold [phone]), editor Shon Hale [phone])  on 12/15/2017 8:06:59 AM   Pending Labs Unresulted Labs (From admission, onward)    Start     Ordered   12/14/17 1815  Urine culture  ONCE - STAT,   STAT     12/14/17 1814           Vitals/Pain Today's Vitals   12/15/17 1330 12/15/17 1345 12/15/17 1400 12/15/17 1430  BP: 112/66 (!) 113/55 (!) 102/51 101/65  Pulse: 74 75 72 73  Resp: _0 Temp:      TempSrc:      SpO2: 100% 100% 99% 99%  Weight:      PainSc:        Isolation Precautions No active isolations  Medications Medications  0.9 %  sodium chloride infusion ( Intravenous New Bag/Given 12/14/17 2053)  Vitamin D3 CAPS 1,000 Units (0 Units Oral Hold 12/15/17 0434)  acetaminophen (TYLENOL) tablet 1,000 mg (1,000 mg Oral Given 12/15/17 1417)  Vitamin B 12 TABS 1,000 mcg (has no administration in time range)  docusate sodium (COLACE) capsule 100 mg (has no administration in time range)  tamsulosin (FLOMAX) capsule 0.4 mg (has no administration in time range)  gabapentin (NEURONTIN) capsule 100 mg (has no administration in time range)  insulin aspart protamine- aspart (NOVOLOG MIX 70/30) injection 15 Units (0 Units Subcutaneous Hold 12/15/17 1324)  aspirin EC tablet 81 mg (has no administration in time range)  insulin aspart (novoLOG) injection 0-9 Units (0 Units Subcutaneous Not Given 12/15/17 0957)  heparin injection 5,000 Units (0 Units Subcutaneous Hold 12/15/17 0434)  0.9 %  sodium chloride infusion ( Intravenous Hold 12/15/17 0433)  ondansetron (ZOFRAN) tablet 4 mg (has no administration in time range)    Or  ondansetron (ZOFRAN) injection 4 mg (has no administration in time range)  sodium chloride 0.9 % bolus 500 mL (0 mLs Intravenous Stopped 12/14/17 1917)  cefTRIAXone (ROCEPHIN) 1 g in sodium chloride 0.9 % 100 mL IVPB (0 g Intravenous Stopped 12/14/17 2156)    Mobility walks with person assist

## 2017-12-15 NOTE — Clinical Social Work Note (Signed)
Clinical Social Work Assessment  Patient Details  Name: Michael [name] MRN: [id number] Date of Birth: 12/25/1930  Date of referral:  12/15/17               Reason for consult:  Discharge Planning                Permission sought to share information with:  Family Supports Permission granted to share information::  Yes, Verbal Permission Granted  Name::     Michael [name]  Agency::     Relationship::  Daughter  Contact Information:  [phone]  Housing/Transportation Living arrangements for the past 2 months:  (Patient was living at home before he was hospitalized for a month and then discharged to [facility] for a week.) Source of Information:  Patient Patient Interpreter Needed:  None Criminal Activity/Legal Involvement Pertinent to Current Situation/Hospitalization:  No - Comment as needed Significant Relationships:  Adult Children Lives with:  Self Do you feel safe going back to the place where you live?  Yes Need for family participation in patient care:     Care giving concerns:  CSW consulted as patient is unsure of his disposition once discharged.    Social Worker assessment / plan:  CSW spoke with patient at bedside regarding current situation. Patient reports that he was recently hospitalized at [facility] for about 4 weeks. Patient states he was discharged 11/10 to [facility] for short-term rehab. Patient reports he was there for about a week and was not eating enough and then his blood pressure dropped. Per patient, he went to see his doctor who recommended that he go to the ED. Patient reports that he has 6 living children and prefers that all communication happen with his daughter, Michael [name]. Patient states he was living at home before his recent hospitalization and would like to return back there once he's through with rehab.   Patient requested that CSW reach out to [place] to let her know that patient was still here. CSW attempted and left voicemail for  return call.  Employment status:  Retired Astronomer) PT Recommendations:  Not assessed at this time(came from [facility] where he was receiving short-term rehab) Information / Referral to community resources:     Patient/Family's Response to care:  Patient engaged throughout assessment. Patient not sure what his disposition plan is once he's discharged because he keeps coming in and out of the hospital. Patient appreciative of CSWs help and involvement throughout process.  Patient/Family's Understanding of and Emotional Response to Diagnosis, Current Treatment, and Prognosis:  Patient hopeful that he'll be able to return home after he receives rehab. Patient aware that he's being admitted inpatient.   Emotional Assessment Appearance:  Appears stated age Attitude/Demeanor/Rapport:  Engaged, Gracious Affect (typically observed):  Appropriate, Calm, Pleasant Orientation:  Oriented to Self, Oriented to Situation, Oriented to Place, Oriented to  Time Alcohol / Substance use:    Psych involvement (Current and /or in the community):     Discharge Needs  Concerns to be addressed:  Discharge Planning Concerns Readmission within the last 30 days:  Yes Current discharge risk:  None Barriers to Discharge:      Ollen Barges, [place] 12/15/2017, 8:00 AM

## 2017-12-15 NOTE — Telephone Encounter (Signed)
Unable to reach patient per 11/20 sch message - sent reminder letter in the mail with appt date and time.

## 2017-12-15 NOTE — ED Notes (Signed)
Bed: [facility] Expected date:  Expected time:  Means of arrival:  Comments: 

## 2017-12-15 NOTE — ED Notes (Signed)
Michael [name] [phone] home               [phone] cell  [name] daughter

## 2017-12-15 NOTE — Progress Notes (Signed)
PROGRESS NOTE    Michael [name]  WRU:[id number] DOB: May 26, 1930 DOA: 12/14/2017 PCP: Jani Gravel, MD   Brief Narrative:  Michael [name] is a 85 y.o. male with medical history significant of CLL currently on treatment with ibrutinib, recent admission to the hospital for incarcerated left inguinal hernia from 11/10/2017 to 91/47/8295 with complications including re-incarceration with postoperative ileus and kidney failure,.  History of pancytopenia secondary to chemotherapy, history of skin ulcers who came to [place] for follow-up from the nursing facility today.  While there he was found to have systolic blood pressure in the 60s.  No fever or chills no obvious evidence of sepsis.  Patient was sent to the ER for further evaluation.  He has been given multiple doses of IV fluids but still remains mildly hypotensive.  Systolic has finally reached 106 at the moment.  Patient noted to have hyponatremia with hypokalemia.  His creatinine is still elevated.  He reported poor oral intake since going to the facility a week ago.  He feels weak and tired as well as debilitated.  Patient is being admitted for evaluation of these hypotension.  Assessment & Plan:   Principal Problem:   Hypotension Active Problems:   CLL (chronic lymphocytic leukemia) (HCC)   Essential hypertension   Pressure ulcer   Hyponatremia   ARF (acute renal failure) (HCC)   #1 Hypotension: Patient's hypotension likely due to poor oral intake and dehydration.  He responded to IVF.  Pt may have a UTI as well, which could contribute, pt described dysuria to me (though he seems to have denied sx to admitter).  Question of BP medication for pt, but I only see tamsulosin on his home meds.  Oxycodone could also contribute to hypotension.  Will continue to monitor for now.  Follow with continued IVF.  Encourage PO.  # Poor Oral Intake: pt continues to have poor oral intake and decreased appetite.  Continue IVF at this point in time.   Dietician consult.  # Possible Urinary Tract Infection: continue ceftriaxone, follow final culture results.  UA with + LE, negative nitrite, few bacteria, but >50 wbc's.  # LUE superficial thrombophlebitis: suspected at cancer center, he does have some tenderness to L forearm.  Follow LUE [place].  #2 acute kidney injury: improved with IV fluids  #3 CLL: Continue treatment per oncology.  Currently on ibrutinib, followed by Dr. Alvy Bimler.  On hold at this time.  Will need to follow up with dr. Alvy Bimler.  #4 hyponatremia: Most likely secondary to dehydration.  Monitor sodium level as we continue with hydration.  # Anemia: relatively stable, follow  # T2DM: continue 70/30 and SSI (watch closely for hypoglycemia with NPH and poor PO intake - decrease to 10 units BID)  # Decubitus Ulcer: doesn't appear infected, but does have some eschar in center of wound, will have wound care follow  DVT prophylaxis: heparin Code Status: full code Family Communication: daughters at bedside Disposition Plan: pending improvement, pt still with poor PO intake requiring IV fluids and antibiotics for possible UTI.  Cultures pending.  Continues to require inpatient hospitalization at this time.   Consultants:   none  Procedures:   none  Antimicrobials:  Anti-infectives (From admission, onward)   Start     Dose/Rate Route Frequency Ordered Stop   12/15/17 2100  cefTRIAXone (ROCEPHIN) 1 g in sodium chloride 0.9 % 100 mL IVPB     1 g 200 mL/hr over 30 Minutes Intravenous Every 24 hours 12/15/17 1800  12/14/17 1815  cefTRIAXone (ROCEPHIN) 1 g in sodium chloride 0.9 % 100 mL IVPB     1 g 200 mL/hr over 30 Minutes Intravenous  Once 12/14/17 1814 12/14/17 2156     Subjective: Feeling lousy.  Decreased appetite.  Hasn't felt well really since his surgeries.  Denies CP, SOB.  Notes some dysuria.  Objective: Vitals:   12/15/17 1345 12/15/17 1400 12/15/17 1430 12/15/17 1814  BP: (!) 113/55 (!) 102/51  101/65 (!) 103/48  Pulse: 75 72 73 68  Resp: 16 19 19 18   Temp:    98.5 F (36.9 C)  TempSrc:    Oral  SpO2: 100% 99% 99% 100%  Weight:    71.7 kg  Height:    5\' 7"  (1.702 m)    Intake/Output Summary (Last 24 hours) at 12/15/2017 2014 Last data filed at 12/15/2017 1800 Gross per 24 hour  Intake 2161.76 ml  Output -  Net 2161.76 ml   Filed Weights   12/14/17 1641 12/15/17 1814  Weight: 70.3 kg 71.7 kg    Examination:  General exam: Appears calm and comfortable  Respiratory system: Clear to auscultation. Respiratory effort normal. Cardiovascular system: S1 & S2 heard, RRR.  Gastrointestinal system: Abdomen is nondistended, soft and nontender. Central nervous system: Alert and oriented. No focal neurological deficits. Extremities: moving all extremities equally.  TTP to L forearm. Skin: decubitus ulcer to L buttock, eschar in center of wound, does not appear infected, no TTP, no fluctuance or purulence Psychiatry: Judgement and insight appear normal. Mood & affect appropriate.     Data Reviewed: I have personally reviewed following labs and imaging studies  CBC: Recent Labs  Lab 12/14/17 1448 12/15/17 0400  WBC 9.5 7.9  NEUTROABS 4.5  --   HGB 9.1* 8.1*  HCT 29.7* 26.7*  MCV 85.8 87.8  PLT 265 341   Basic Metabolic Panel: Recent Labs  Lab 12/14/17 1448 12/15/17 0400  NA 133* 135  K 4.8 3.9  CL 97* 101  CO2 25 27  GLUCOSE 134* 108*  BUN 37* 31*  CREATININE 1.47* 0.83  CALCIUM 8.4* 7.8*   GFR: Estimated Creatinine Clearance: 60.8 mL/min (by C-G formula based on SCr of 0.83 mg/dL). Liver Function Tests: Recent Labs  Lab 12/14/17 1448 12/15/17 0400  AST 21 16  ALT 14 15  ALKPHOS 83 58  BILITOT <0.2* 0.4  PROT 6.2* 5.2*  ALBUMIN 2.0* 1.9*   No results for input(s): LIPASE, AMYLASE in the last 168 hours. No results for input(s): AMMONIA in the last 168 hours. Coagulation Profile: No results for input(s): INR, PROTIME in the last 168  hours. Cardiac Enzymes: No results for input(s): CKTOTAL, CKMB, CKMBINDEX, TROPONINI in the last 168 hours. BNP (last 3 results) No results for input(s): PROBNP in the last 8760 hours. HbA1C: No results for input(s): HGBA1C in the last 72 hours. CBG: Recent Labs  Lab 12/15/17 0820 12/15/17 1731  GLUCAP 97 170*   Lipid Profile: No results for input(s): CHOL, HDL, LDLCALC, TRIG, CHOLHDL, LDLDIRECT in the last 72 hours. Thyroid Function Tests: Recent Labs    12/15/17 0400  TSH 1.324   Anemia Panel: No results for input(s): VITAMINB12, FOLATE, FERRITIN, TIBC, IRON, RETICCTPCT in the last 72 hours. Sepsis Labs: No results for input(s): PROCALCITON, LATICACIDVEN in the last 168 hours.  No results found for this or any previous visit (from the past 240 hour(s)).       Radiology Studies: No results found.      Scheduled  Meds: . aspirin EC  81 mg Oral Daily  . cholecalciferol  1,000 Units Oral BID  . gabapentin  100 mg Oral TID  . heparin  5,000 Units Subcutaneous Q8H  . insulin aspart  0-9 Units Subcutaneous TID WC  . insulin aspart protamine- aspart  15 Units Subcutaneous BID WC  . tamsulosin  0.4 mg Oral Daily  . vitamin B-12  1,000 mcg Oral Daily   Continuous Infusions: . sodium chloride 100 mL/hr at 12/14/17 2053  . sodium chloride Stopped (12/15/17 0433)  . cefTRIAXone (ROCEPHIN)  IV       LOS: 1 day    Time spent: over 30 min    Fayrene Helper, MD [facility] Pager [phone]  If 7PM-7AM, please contact night-coverage [email or web address] Password [email or web address] 12/15/2017, 8:14 PM

## 2017-12-16 ENCOUNTER — Inpatient Hospital Stay (HOSPITAL_COMMUNITY): Payer: Medicare HMO

## 2017-12-16 DIAGNOSIS — R52 Pain, unspecified: Secondary | ICD-10-CM

## 2017-12-16 LAB — CBC
HCT: 28.5 % — ABNORMAL LOW (ref 39.0–52.0)
HEMOGLOBIN: 8.4 g/dL — AB (ref 13.0–17.0)
MCH: 26 pg (ref 26.0–34.0)
MCHC: 29.5 g/dL — ABNORMAL LOW (ref 30.0–36.0)
MCV: 88.2 fL (ref 80.0–100.0)
NRBC: 0 % (ref 0.0–0.2)
Platelets: 292 10*3/uL (ref 150–400)
RBC: 3.23 MIL/uL — AB (ref 4.22–5.81)
RDW: 16 % — ABNORMAL HIGH (ref 11.5–15.5)
WBC: 7.8 10*3/uL (ref 4.0–10.5)

## 2017-12-16 LAB — URINE CULTURE

## 2017-12-16 LAB — BASIC METABOLIC PANEL
ANION GAP: 9 (ref 5–15)
BUN: 13 mg/dL (ref 8–23)
CHLORIDE: 106 mmol/L (ref 98–111)
CO2: 25 mmol/L (ref 22–32)
Calcium: 8 mg/dL — ABNORMAL LOW (ref 8.9–10.3)
Creatinine, Ser: 0.57 mg/dL — ABNORMAL LOW (ref 0.61–1.24)
GFR calc non Af Amer: >60 mL/min (ref >60)
Glucose, Bld: 136 mg/dL — ABNORMAL HIGH (ref 70–99)
Potassium: 4.6 mmol/L (ref 3.5–5.1)
Sodium: 140 mmol/L (ref 135–145)

## 2017-12-16 LAB — GLUCOSE, CAPILLARY
GLUCOSE-CAPILLARY: 115 mg/dL — AB (ref 70–99)
Glucose-Capillary: 126 mg/dL — ABNORMAL HIGH (ref 70–99)
Glucose-Capillary: 167 mg/dL — ABNORMAL HIGH (ref 70–99)
Glucose-Capillary: 92 mg/dL (ref 70–99)

## 2017-12-16 LAB — MAGNESIUM: Magnesium: 1.7 mg/dL (ref 1.7–2.4)

## 2017-12-16 MED ORDER — COLLAGENASE 250 UNIT/GM EX OINT
TOPICAL_OINTMENT | Freq: Every day | CUTANEOUS | Status: DC
  Administered 2017-12-16: 09:00:00 via TOPICAL
  Administered 2017-12-17: 1 via TOPICAL
  Administered 2017-12-18 – 2017-12-22 (×5): via TOPICAL
  Filled 2017-12-16: qty 90

## 2017-12-16 MED ORDER — MIRTAZAPINE 15 MG PO TABS
7.5000 mg | ORAL_TABLET | Freq: Every day | ORAL | Status: DC
  Administered 2017-12-16 – 2017-12-19 (×4): 7.5 mg via ORAL
  Filled 2017-12-16 (×4): qty 1

## 2017-12-16 MED ORDER — ADULT MULTIVITAMIN W/MINERALS CH
1.0000 | ORAL_TABLET | Freq: Every day | ORAL | Status: DC
  Administered 2017-12-16 – 2017-12-22 (×7): 1 via ORAL
  Filled 2017-12-16 (×7): qty 1

## 2017-12-16 MED ORDER — ENSURE ENLIVE PO LIQD
237.0000 mL | Freq: Three times a day (TID) | ORAL | Status: DC
  Administered 2017-12-16 – 2017-12-22 (×14): 237 mL via ORAL

## 2017-12-16 NOTE — Progress Notes (Signed)
Initial Nutrition Assessment  DOCUMENTATION CODES:   (Will assess for malnutrition at follow-up)  INTERVENTION:  - Will order Ensure Enlive TID, each supplement provides 350 kcal and 20 grams of protein. - Will order Magic Cup BID with meals, each supplement provides 290 kcal and 9 grams of protein - Will order daily multivitamin with minerals. - Continue to encourage PO intakes.    NUTRITION DIAGNOSIS:   Increased nutrient needs related to acute illness, wound healing as evidenced by estimated needs.  GOAL:   Patient will meet greater than or equal to 90% of their needs  MONITOR:   PO intake, Supplement acceptance, Weight trends, Labs  REASON FOR ASSESSMENT:   Consult Assessment of nutrition requirement/status  ASSESSMENT:   84 y.o. male with medical history significant of CLL currently on treatment with ibrutinib, recent admission to the hospital (10/16-11/10) for incarcerated L inguinal hernia with complications including re-incarceration with post-op ileus and kidney failure. He presented to [facility] for follow-up and was found to have systolic blood pressure in the 60s and was sent to the ED for further evaluation. He feels weak and tired as well as debilitated.  BMI indicates normal weight. Per chart review, patient consumed 50% of breakfast this AM. Patient sleeping soundly at the time of RD visit. Daughter, who is at bedside, provided all information and requested that patient not be woken up. She reports that she ordered pinto beans and pot roast, two of patient's favorites, for lunch and that patient did not eat more than a few bites.  Patient went to [facility] after d/c from [place] on 11/10. From discussion with daughter, it sounds like patient was very depressed while he was there because he had not wanted to go to that facility (wanted [facility]) and that he was withdrawn and disinterested in things that typically brought him joy. Talked with Dr. Florene Glen who reports  plan to start Remeron tonight.   Patient has no chewing or swallowing difficulties. He has not complained of abdominal pain or nausea. Daughter has been seasoning his foods but even favorite foods are not desired. He had been drinking Ensure at [facility] and daughter is very interested in him receiving this. Encouraged sipping on it throughout the day and using to take PO medications. Patient loves ice cream and daughter feels he will do well with Magic Cup.   Highly suspect malnutrition but unable to state degree without performing NFPE; will attempt at follow-up. Per chart review, current weight is 158 lb and weight on 10/28 was 172 lb. This indicates 14 lb weight loss (8% body weight) in 1 month; significant for time frame.   Medications reviewed; 1000 units cholecalciferol BID,sliding scale Novolog, 1 packet Miralax/day, 1000 mcg cyanocobalamin/day.  Labs reviewed; CBGs: 126 and 167 mg/dL today, creatinine: 0.57 mg/dL, Ca: 8 mg/dL.      NUTRITION - FOCUSED PHYSICAL EXAM:  Will attempt at follow-up.   Diet Order:   Diet Order            Diet Carb Modified Fluid consistency: Thin; Room service appropriate? Yes  Diet effective now              EDUCATION NEEDS:   Education needs have been addressed  Skin:  Skin Assessment: Skin Integrity Issues: Skin Integrity Issues:: Unstageable DTI: L buttocks Unstageable: full thickness to L heel  Last BM:  11/18  Height:   Ht Readings from Last 1 Encounters:  12/15/17 5\' 7"  (1.702 m)    Weight:  Wt Readings from Last 1 Encounters:  12/15/17 71.7 kg    Ideal Body Weight:  67.27 kg  BMI:  Body mass index is 24.75 kg/m.  Estimated Nutritional Needs:   Kcal:  2000-2200 kcal  Protein:  100-110 grams  Fluid:  >/= 2 L/day     Jarome Matin, MS, RD, LDN, [facility] Inpatient Clinical Dietitian Pager # [phone] After hours/weekend pager # [phone]

## 2017-12-16 NOTE — Progress Notes (Signed)
Left upper extremity venous duplex completed - Preliminary results - Cannot rule out a possible chronic DVT in a small area of the internal jugular vein. There is no evidence of an acute DVT throughout. There is an acute superficial thrombosis of the cephalic vein from the antecubital fossa to the mid to proximal upper arm. [place] Quang Thorpe,RVS 12/16/2017, 10:03 AM

## 2017-12-16 NOTE — Plan of Care (Signed)
Patient without complaint on 7 a to 7 p shift other than pain in his buttocks if he sits directly on wound.  Transferred to air mattress today.   Tolerating carb modified diet.  Up to [facility] with one assist.  Daughter at bedside.

## 2017-12-16 NOTE — Care Management Note (Signed)
Case Management Note  Patient Details  Name: Michael [name] MRN: [id number] Date of Birth: 02/15/1930  Subjective/Objective:Hypotension. Readmit.Recent stay @ [facility]. PT cons-await recc.                    Action/Plan:d/c plan home.   Expected Discharge Date:  (unknown)               Expected Discharge Plan:     In-House Referral:  Clinical Social Work  Discharge planning Services  CM Consult  Post Acute Care Choice:    Choice offered to:     DME Arranged:    DME Agency:     HH Arranged:    HH Agency:     Status of Service:  In process, will continue to follow  If discussed at Long Length of Stay Meetings, dates discussed:    Additional Comments:  Dessa Phi, RN 12/16/2017, 1:21 PM

## 2017-12-16 NOTE — Consult Note (Addendum)
[facility] Nurse wound consult note Reason for Consult:left buttock/sacral unstageable and right lateral heel DTI Wound type:pressure Pressure Injury POA: Yes Measurement:proximal, medial left buttock onto sacrum 8cm x 6cm x unknown depth surrounded by 3cm of DTI. 100% covered in slough, moderate yellow exudate, malodorous. Right lateral heel 1.5cm round DTI Wound bed:see above Drainage (amount, consistency, odor) see above Periwound:see above Dressing procedure/placement/frequency:I have provided nurses with orders for Santyl enzymatic ointment to be restarted to left buttock/sacral wound. Bilateral Prevalon boots ordered for protection of DTI and prevention of more injury.I have ordered a mattress with low air loss features.  Patient's nutritional status is poor and would benefit from a Nutritional Consult for optimal wound healing, please order if you agree.We will not follow, but will remain available to this patient, to nursing, and the medical and/or surgical teams. Fara Olden, RN-C, WTA-C, [place] Wound Treatment Associate Ostomy Care Associate

## 2017-12-16 NOTE — Progress Notes (Signed)
PROGRESS NOTE    Quantavius Humm  TGG:[id number] DOB: August 24, 1930 DOA: 12/14/2017 PCP: Jani Gravel, MD   Brief Narrative:  Brek Reece is a 84 y.o. male with medical history significant of CLL currently on treatment with ibrutinib, recent admission to the hospital for incarcerated left inguinal hernia from 11/10/2017 to 70/35/0093 with complications including re-incarceration with postoperative ileus and kidney failure,.  History of pancytopenia secondary to chemotherapy, history of skin ulcers who came to [facility] for follow-up from the nursing facility today.  While there he was found to have systolic blood pressure in the 60s.  No fever or chills no obvious evidence of sepsis.  Patient was sent to the ER for further evaluation.  He has been given multiple doses of IV fluids but still remains mildly hypotensive.  Systolic has finally reached 106 at the moment.  Patient noted to have hyponatremia with hypokalemia.  His creatinine is still elevated.  He reported poor oral intake since going to the facility a week ago.  He feels weak and tired as well as debilitated.  Patient is being admitted for evaluation of these hypotension.  Assessment & Plan:   Principal Problem:   Hypotension Active Problems:   CLL (chronic lymphocytic leukemia) (HCC)   Essential hypertension   Pressure ulcer   Hyponatremia   ARF (acute renal failure) (HCC)   #1 Hypotension: Patient's hypotension likely due to poor oral intake and dehydration.  He responded to IVF.  Question of BP medication for pt, but I only see tamsulosin on his home meds.  Oxycodone could also contribute to hypotension.  Will continue to monitor for now.  His BP's are improved today.  Will stop IVF and monitor off IVF and watch PO intake to see if he's able to keep up with needs.  # Poor Oral Intake: pt continues to have poor oral intake and decreased appetite.  Continue IVF at this point in time.  Dietician consult. - start remeron  #  Possible Urinary Tract Infection: follow final culture results (growing multiple species).  UA with + LE, negative nitrite, few bacteria, but >50 wbc's.  Will d/c abx.  # LUE superficial thrombophlebitis: pt with no DVT, acute superficial vein thrombosis of L cephalic vein.  Continue to monitor.    #2 acute kidney injury: improved with IV fluids  #3 CLL: Continue treatment per oncology.  Currently on ibrutinib, followed by Dr. Alvy Bimler.  On hold at this time.  Will need to follow up with dr. Alvy Bimler.  #4 hyponatremia:improved  # Anemia: relatively stable, follow  # T2DM: continue 70/30 and SSI (watch closely for hypoglycemia with NPH and poor PO intake - decreased to 10 units BID)  # Decubitus Ulcer: appreciate wound care recs - nutrition consult  DVT prophylaxis: heparin Code Status: full code Family Communication: daughters at bedside Disposition Plan: pending further improvement.  Hopefully will continue to improve and possible discharge within next 24-48 hours.     Consultants:   none  Procedures:   none  Antimicrobials:  Anti-infectives (From admission, onward)   Start     Dose/Rate Route Frequency Ordered Stop   12/15/17 2100  cefTRIAXone (ROCEPHIN) 1 g in sodium chloride 0.9 % 100 mL IVPB  Status:  Discontinued     1 g 200 mL/hr over 30 Minutes Intravenous Every 24 hours 12/15/17 1800 12/16/17 1314   12/14/17 1815  cefTRIAXone (ROCEPHIN) 1 g in sodium chloride 0.9 % 100 mL IVPB     1 g 200 mL/hr  over 30 Minutes Intravenous  Once 12/14/17 1814 12/14/17 2156     Subjective: Feeling a little bit better today. Still with decreased appetite.  Daughter at bedside.  Objective: Vitals:   12/15/17 1430 12/15/17 1814 12/16/17 0609 12/16/17 1329  BP: 101/65 (!) 103/48 (!) 107/57 (!) 109/53  Pulse: 73 68 64 64  Resp: 19 18 20 18   Temp:  98.5 F (36.9 C) 98.5 F (36.9 C) 98.2 F (36.8 C)  TempSrc:  Oral Oral Oral  SpO2: 99% 100% 100% 98%  Weight:  71.7 kg      Height:  5\' 7"  (1.702 m)      Intake/Output Summary (Last 24 hours) at 12/16/2017 1857 Last data filed at 12/16/2017 1300 Gross per 24 hour  Intake 600 ml  Output 1400 ml  Net -800 ml   Filed Weights   12/14/17 1641 12/15/17 1814  Weight: 70.3 kg 71.7 kg    Examination:  General: No acute distress. Cardiovascular: Heart sounds show a regular rate, and rhythm. Lungs: Clear to auscultation bilaterally  Abdomen: Soft, nontender, nondistended Neurological: Alert and oriented 3. Moves all extremities 4. Cranial nerves II through XII grossly intact. Skin: unstageable decub to L buttock.  Does not appear infected.  Deep tissue injury to R lateral heel.  Extremities: No clubbing or cyanosis. No edema. Pedal pulses 2+. Psychiatric: Mood and affect are normal. Insight and judgment are appropriate.  Data Reviewed: I have personally reviewed following labs and imaging studies  CBC: Recent Labs  Lab 12/14/17 1448 12/15/17 0400 12/16/17 0559  WBC 9.5 7.9 7.8  NEUTROABS 4.5  --   --   HGB 9.1* 8.1* 8.4*  HCT 29.7* 26.7* 28.5*  MCV 85.8 87.8 88.2  PLT 265 257 865   Basic Metabolic Panel: Recent Labs  Lab 12/14/17 1448 12/15/17 0400 12/16/17 0559  NA 133* 135 140  K 4.8 3.9 4.6  CL 97* 101 106  CO2 25 27 25   GLUCOSE 134* 108* 136*  BUN 37* 31* 13  CREATININE 1.47* 0.83 0.57*  CALCIUM 8.4* 7.8* 8.0*  MG  --   --  1.7   GFR: Estimated Creatinine Clearance: 63.1 mL/min (A) (by C-G formula based on SCr of 0.57 mg/dL (L)). Liver Function Tests: Recent Labs  Lab 12/14/17 1448 12/15/17 0400  AST 21 16  ALT 14 15  ALKPHOS 83 58  BILITOT <0.2* 0.4  PROT 6.2* 5.2*  ALBUMIN 2.0* 1.9*   No results for input(s): LIPASE, AMYLASE in the last 168 hours. No results for input(s): AMMONIA in the last 168 hours. Coagulation Profile: No results for input(s): INR, PROTIME in the last 168 hours. Cardiac Enzymes: No results for input(s): CKTOTAL, CKMB, CKMBINDEX, TROPONINI in  the last 168 hours. BNP (last 3 results) No results for input(s): PROBNP in the last 8760 hours. HbA1C: No results for input(s): HGBA1C in the last 72 hours. CBG: Recent Labs  Lab 12/15/17 1731 12/15/17 2223 12/16/17 0745 12/16/17 1139 12/16/17 1621  GLUCAP 170* 130* 126* 167* 92   Lipid Profile: No results for input(s): CHOL, HDL, LDLCALC, TRIG, CHOLHDL, LDLDIRECT in the last 72 hours. Thyroid Function Tests: Recent Labs    12/15/17 0400  TSH 1.324   Anemia Panel: No results for input(s): VITAMINB12, FOLATE, FERRITIN, TIBC, IRON, RETICCTPCT in the last 72 hours. Sepsis Labs: No results for input(s): PROCALCITON, LATICACIDVEN in the last 168 hours.  Recent Results (from the past 240 hour(s))  Urine culture     Status: Abnormal  Collection Time: 12/14/17  5:25 PM  Result Value Ref Range Status   Specimen Description   Final    URINE, RANDOM Performed at [facility] [address], [place], [place] [address]    Special Requests   Final    NONE Performed at [facility], [place] [address] [place], [place] [address]    Culture MULTIPLE SPECIES PRESENT, SUGGEST RECOLLECTION (A)  Final   Report Status 12/16/2017 FINAL  Final         Radiology Studies: Vas [place] Upper Extremity Venous Duplex  Result Date: 12/16/2017 UPPER VENOUS STUDY  Indications: Pain Performing Technologist: Toma Copier RVS  Examination Guidelines: A complete evaluation includes B-mode imaging, spectral Doppler, color Doppler, and power Doppler as needed of all accessible portions of each vessel. Bilateral testing is considered an integral part of a complete examination. Limited examinations for reoccurring indications may be performed as noted.  Right Findings: +----------+------------+----------+---------+-----------+---------------------+ RIGHT     CompressiblePropertiesPhasicitySpontaneous       Summary         +----------+------------+----------+---------+-----------+---------------------+ Subclavian                                          Unable to adequately                                                      image due to clavicle +----------+------------+----------+---------+-----------+---------------------+  Left Findings: +--------+------------+----------+---------+-----------+-----------------------+ LEFT    CompressiblePropertiesPhasicitySpontaneous        Summary         +--------+------------+----------+---------+-----------+-----------------------+ IJV         Full                 Yes       Yes                            +--------+------------+----------+---------+-----------+-----------------------+ Axillary    Full                 Yes       Yes                            +--------+------------+----------+---------+-----------+-----------------------+ Brachial    Full                 Yes       Yes                            +--------+------------+----------+---------+-----------+-----------------------+ Radial      Full                                                          +--------+------------+----------+---------+-----------+-----------------------+ Ulnar       Full                                                          +--------+------------+----------+---------+-----------+-----------------------+  Cephalic    None                                   Acute from [facility]                                                            Proximal         +--------+------------+----------+---------+-----------+-----------------------+ Basilic     Full                                                          +--------+------------+----------+---------+-----------+-----------------------+  Summary:  Left: No evidence of deep vein thrombosis in the upper extremity. No evidence of thrombosis in the subclavian. Findings consistent with  acute superficial vein thrombosis involving the left cephalic vein.  *See table(s) above for measurements and observations.  Diagnosing physician: Monica Martinez MD Electronically signed by Monica Martinez MD on 12/16/2017 at 3:38:53 PM.    Final         Scheduled Meds: . aspirin EC  81 mg Oral Daily  . cholecalciferol  1,000 Units Oral BID  . collagenase   Topical Daily  . feeding supplement (ENSURE ENLIVE)  237 mL Oral TID BM  . gabapentin  100 mg Oral TID  . heparin  5,000 Units Subcutaneous Q8H  . insulin aspart  0-9 Units Subcutaneous TID WC  . insulin aspart protamine- aspart  10 Units Subcutaneous BID WC  . mirtazapine  7.5 mg Oral QHS  . multivitamin with minerals  1 tablet Oral Daily  . polyethylene glycol  17 g Oral Daily  . tamsulosin  0.4 mg Oral Daily  . vitamin B-12  1,000 mcg Oral Daily   Continuous Infusions:    LOS: 2 days    Time spent: over 30 min    Fayrene Helper, MD [facility] Pager [phone]  If 7PM-7AM, please contact night-coverage [email or web address] Password [facility] 12/16/2017, 6:57 PM

## 2017-12-16 NOTE — Evaluation (Signed)
Physical Therapy Evaluation Patient Details Name: Michael [name] MRN: [id number] DOB: 1930-09-09 Today's Date: 12/16/2017   History of Present Illness  84 y.o. male with medical history significant of CLL currently on treatment with ibrutinib, recent admission to the hospital for incarcerated left inguinal hernia from 11/10/2017 to 16/96/7893 with complications including re-incarceration with postoperative ileus and kidney failure and admitted from [facility] for hypotension  Clinical Impression  Pt admitted with above diagnosis. Pt currently with functional limitations due to the deficits listed below (see PT Problem List). Pt will benefit from skilled PT to increase their independence and safety with mobility to allow discharge to the venue listed below.  Pt with recent admission and d/c to SNF for rehab.   Pt assisted OOB to recliner and plans to return to SNF upon d/c.     Follow Up Recommendations SNF    Equipment Recommendations  None recommended by PT    Recommendations for Other Services       Precautions / Restrictions Precautions Precautions: Fall      Mobility  Bed Mobility Overal bed mobility: Needs Assistance Bed Mobility: Supine to Sit     Supine to sit: Min assist;HOB elevated     General bed mobility comments: pt performed as able, requested assist for trunk upright  Transfers Overall transfer level: Needs assistance Equipment used: Rolling walker (2 wheeled) Transfers: Sit to/from Stand Sit to Stand: Min assist Stand pivot transfers: Min assist       General transfer comment: verbal cues for hand placement, assist to rise and steady, pt only agreeable for OOB to recliner at this time (was sleeping on arrival to room)  Ambulation/Gait                Stairs            Wheelchair Mobility    Modified Rankin (Stroke Patients Only)       Balance Overall balance assessment: Needs assistance Sitting-balance support: Feet  supported Sitting balance-Leahy Scale: Fair     Standing balance support: Bilateral upper extremity supported Standing balance-Leahy Scale: Poor Standing balance comment: requires UE support                             Pertinent Vitals/Pain Pain Assessment: Faces Faces Pain Scale: Hurts even more Pain Location: L UE Pain Descriptors / Indicators: Discomfort;Sore Pain Intervention(s): Limited activity within patient's tolerance;Repositioned;Monitored during session    [place] expects to be discharged to:: Private residence   Available Help at Discharge: Family;Available PRN/intermittently Type of Home: House Home Access: Ramped entrance     Home Layout: One level Home Equipment: Grab bars - tub/shower;Shower seat;Walker - 4 wheels;Walker - 2 wheels Additional Comments: above per prior to recent admission, pt admitted from [facility]    Prior Function Level of Independence: Independent with assistive device(s)         Comments: Used cane     Hand Dominance   Dominant Hand: Right    Extremity/Trunk Assessment   Upper Extremity Assessment Upper Extremity Assessment: LUE deficits/detail LUE Deficits / Details: pt reports L UE pain however was using slightly for mobility    Lower Extremity Assessment Lower Extremity Assessment: Generalized weakness       Communication   Communication: No difficulties  Cognition Arousal/Alertness: Awake/alert Behavior During Therapy: WFL for tasks assessed/performed Overall Cognitive Status: Within Functional Limits for tasks assessed  General Comments      Exercises     Assessment/Plan    PT Assessment Patient needs continued PT services  PT Problem List Decreased strength;Decreased activity tolerance;Decreased balance;Decreased mobility;Pain;Decreased knowledge of use of DME       PT Treatment Interventions Gait  training;Functional mobility training;Therapeutic activities;Therapeutic exercise;DME instruction;Balance training;Patient/family education    PT Goals (Current goals can be found in the Care Plan section)  Acute Rehab PT Goals PT Goal Formulation: With patient Time For Goal Achievement: 12/30/17 Potential to Achieve Goals: Good    Frequency Min 2X/week   Barriers to discharge        Co-evaluation               AM-PAC PT "6 Clicks" Daily Activity  Outcome Measure Difficulty turning over in bed (including adjusting bedclothes, sheets and blankets)?: A Lot Difficulty moving from lying on back to sitting on the side of the bed? : A Lot Difficulty sitting down on and standing up from a chair with arms (e.g., wheelchair, bedside commode, etc,.)?: Unable Help needed moving to and from a bed to chair (including a wheelchair)?: A Little Help needed walking in hospital room?: A Lot Help needed climbing 3-5 steps with a railing? : Total 6 Click Score: 11    End of Session Equipment Utilized During Treatment: Gait belt Activity Tolerance: Patient tolerated treatment well Patient left: with call bell/phone within reach;with family/visitor present;in chair;with chair alarm set Nurse Communication: Mobility status PT Visit Diagnosis: Muscle weakness (generalized) (M62.81);Difficulty in walking, not elsewhere classified (R26.2)    Time: 3009-2330 PT Time Calculation (min) (ACUTE ONLY): 16 min   Charges:   PT Evaluation $PT Eval Low Complexity: [place], PT, DPT Acute Rehabilitation Services Office: [phone] Pager: [phone]  Trena Platt 12/16/2017, 2:10 PM

## 2017-12-17 LAB — GLUCOSE, CAPILLARY
GLUCOSE-CAPILLARY: 130 mg/dL — AB (ref 70–99)
Glucose-Capillary: 138 mg/dL — ABNORMAL HIGH (ref 70–99)
Glucose-Capillary: 147 mg/dL — ABNORMAL HIGH (ref 70–99)
Glucose-Capillary: 79 mg/dL (ref 70–99)

## 2017-12-17 LAB — COMPREHENSIVE METABOLIC PANEL
ALK PHOS: 57 U/L (ref 38–126)
ALT: 14 U/L (ref 0–44)
ANION GAP: 7 (ref 5–15)
AST: 17 U/L (ref 15–41)
Albumin: 1.9 g/dL — ABNORMAL LOW (ref 3.5–5.0)
BUN: 13 mg/dL (ref 8–23)
CALCIUM: 8.2 mg/dL — AB (ref 8.9–10.3)
CO2: 26 mmol/L (ref 22–32)
CREATININE: 0.79 mg/dL (ref 0.61–1.24)
Chloride: 104 mmol/L (ref 98–111)
GFR calc non Af Amer: >60 mL/min (ref >60)
Glucose, Bld: 146 mg/dL — ABNORMAL HIGH (ref 70–99)
Potassium: 4.6 mmol/L (ref 3.5–5.1)
SODIUM: 137 mmol/L (ref 135–145)
Total Bilirubin: 0.4 mg/dL (ref 0.3–1.2)
Total Protein: 5.3 g/dL — ABNORMAL LOW (ref 6.5–8.1)

## 2017-12-17 LAB — CBC
HCT: 29.1 % — ABNORMAL LOW (ref 39.0–52.0)
HEMOGLOBIN: 8.6 g/dL — AB (ref 13.0–17.0)
MCH: 25.7 pg — AB (ref 26.0–34.0)
MCHC: 29.6 g/dL — AB (ref 30.0–36.0)
MCV: 87.1 fL (ref 80.0–100.0)
PLATELETS: 315 10*3/uL (ref 150–400)
RBC: 3.34 MIL/uL — ABNORMAL LOW (ref 4.22–5.81)
RDW: 15.9 % — ABNORMAL HIGH (ref 11.5–15.5)
WBC: 9.3 10*3/uL (ref 4.0–10.5)
nRBC: 0 % (ref 0.0–0.2)

## 2017-12-17 LAB — MAGNESIUM: MAGNESIUM: 1.7 mg/dL (ref 1.7–2.4)

## 2017-12-17 MED ORDER — ACETAMINOPHEN 500 MG PO TABS
1000.0000 mg | ORAL_TABLET | Freq: Three times a day (TID) | ORAL | Status: DC | PRN
  Administered 2017-12-17 – 2017-12-22 (×10): 1000 mg via ORAL
  Filled 2017-12-17 (×10): qty 2

## 2017-12-17 NOTE — Progress Notes (Signed)
CSW following patient for support and discharge needs. CSW spoke with patient and daughter at bedside. Patient stated he agreeable to go to rehab but would like to stay at one place long enough to get better. Patient stated his goal is to be able to go back home and stay home. Patient stated he would prefer [facility]. CSW reached out [facility] and per admission coordinator they will be able to offer a bed for patient. Authorization has been started through [place] by [employer].   Rhea Pink, MSW,  [place]

## 2017-12-17 NOTE — Progress Notes (Signed)
PROGRESS NOTE    Shaheer Bonfield  TIW:[id number] DOB: Apr 20, 1930 DOA: 12/14/2017 PCP: Jani Gravel, MD   Brief Narrative:  Michael [name] is a 84 y.o. male with medical history significant of CLL currently on treatment with ibrutinib, recent admission to the hospital for incarcerated left inguinal hernia from 11/10/2017 to 25/05/3974 with complications including re-incarceration with postoperative ileus and kidney failure,.  History of pancytopenia secondary to chemotherapy, history of skin ulcers who came to [place] for follow-up from the nursing facility today.  While there he was found to have systolic blood pressure in the 60s.  No fever or chills no obvious evidence of sepsis.  Patient was sent to the ER for further evaluation.  He has been given multiple doses of IV fluids but still remains mildly hypotensive.  Systolic has finally reached 106 at the moment.  Patient noted to have hyponatremia with hypokalemia.  His creatinine is still elevated.  He reported poor oral intake since going to the facility a week ago.  He feels weak and tired as well as debilitated.  Patient is being admitted for evaluation of these hypotension.  Assessment & Plan:   Principal Problem:   Hypotension Active Problems:   CLL (chronic lymphocytic leukemia) (HCC)   Essential hypertension   Pressure ulcer   Hyponatremia   ARF (acute renal failure) (HCC)   #1 Hypotension: Patient's hypotension likely due to poor oral intake and dehydration.  He responded to IVF.  Question of BP medication for pt, but I only see tamsulosin on his home meds.  Oxycodone could also contribute to hypotension.  His BP's continue to be improved.    # Poor Oral Intake: this seems to be improving. - start remeron at 7.5, continue to monitor.  # Possible Urinary Tract Infection: follow final culture results (growing multiple species).  UA with + LE, negative nitrite, few bacteria, but >50 wbc's.  Will d/c abx.  # LUE superficial  thrombophlebitis: pt with no DVT, acute superficial vein thrombosis of L cephalic vein.  Continue to monitor.  Ice prn.  #2 acute kidney injury: improved with IV fluids  #3 CLL: Continue treatment per oncology.  Currently on ibrutinib, followed by Dr. Alvy Bimler.  On hold at this time.  Will need to follow up with dr. Alvy Bimler.  #4 hyponatremia:improved  # Anemia: relatively stable, follow  # T2DM: continue 70/30 and SSI (watch closely for hypoglycemia with NPH and poor PO intake - decreased to 10 units BID, doing well with this dose at this time)  # Decubitus Ulcer: appreciate wound care recs - nutrition consult  DVT prophylaxis: heparin Code Status: full code Family Communication: daughters at bedside Disposition Plan: pending placement   Consultants:   none  Procedures:   none  Antimicrobials:  Anti-infectives (From admission, onward)   Start     Dose/Rate Route Frequency Ordered Stop   12/15/17 2100  cefTRIAXone (ROCEPHIN) 1 g in sodium chloride 0.9 % 100 mL IVPB  Status:  Discontinued     1 g 200 mL/hr over 30 Minutes Intravenous Every 24 hours 12/15/17 1800 12/16/17 1314   12/14/17 1815  cefTRIAXone (ROCEPHIN) 1 g in sodium chloride 0.9 % 100 mL IVPB     1 g 200 mL/hr over 30 Minutes Intravenous  Once 12/14/17 1814 12/14/17 2156     Subjective: Feeling progressively better today. Some pain at his L buttocks. Appetite is a little better. Daughter at bedside.  Objective: Vitals:   12/16/17 1329 12/16/17 2107 12/17/17 0445  12/17/17 1435  BP: (!) 109/53 (!) 117/51 (!) 112/52 101/64  Pulse: 64 68 60 71  Resp: 18 20 16 20   Temp: 98.2 F (36.8 C) 99.6 F (37.6 C) 99 F (37.2 C) 98.5 F (36.9 C)  TempSrc: Oral Oral Oral Oral  SpO2: 98% 100% 100% 99%  Weight:      Height:        Intake/Output Summary (Last 24 hours) at 12/17/2017 1504 Last data filed at 12/17/2017 1000 Gross per 24 hour  Intake 600 ml  Output 1800 ml  Net -1200 ml   Filed Weights    12/14/17 1641 12/15/17 1814  Weight: 70.3 kg 71.7 kg    Examination:  General: No acute distress. Cardiovascular: Heart sounds show a regular rate, and rhythm Lungs: Clear to auscultation bilaterally Abdomen: Soft, nontender, nondistended Neurological: Alert and oriented. Moves all extremities 4 Cranial nerves II through XII grossly intact. Skin: L buttock with decubitus ucler, unstageable, slough improving Extremities: DTI in R heel not examined today Psychiatric: Mood and affect are normal. Insight and judgment are appropriate.   Data Reviewed: I have personally reviewed following labs and imaging studies  CBC: Recent Labs  Lab 12/14/17 1448 12/15/17 0400 12/16/17 0559 12/17/17 0514  WBC 9.5 7.9 7.8 9.3  NEUTROABS 4.5  --   --   --   HGB 9.1* 8.1* 8.4* 8.6*  HCT 29.7* 26.7* 28.5* 29.1*  MCV 85.8 87.8 88.2 87.1  PLT 265 257 292 413   Basic Metabolic Panel: Recent Labs  Lab 12/14/17 1448 12/15/17 0400 12/16/17 0559 12/17/17 0514  NA 133* 135 140 137  K 4.8 3.9 4.6 4.6  CL 97* 101 106 104  CO2 25 27 25 26   GLUCOSE 134* 108* 136* 146*  BUN 37* 31* 13 13  CREATININE 1.47* 0.83 0.57* 0.79  CALCIUM 8.4* 7.8* 8.0* 8.2*  MG  --   --  1.7 1.7   GFR: Estimated Creatinine Clearance: 63.1 mL/min (by C-G formula based on SCr of 0.79 mg/dL). Liver Function Tests: Recent Labs  Lab 12/14/17 1448 12/15/17 0400 12/17/17 0514  AST 21 16 17   ALT 14 15 14   ALKPHOS 83 58 57  BILITOT <0.2* 0.4 0.4  PROT 6.2* 5.2* 5.3*  ALBUMIN 2.0* 1.9* 1.9*   No results for input(s): LIPASE, AMYLASE in the last 168 hours. No results for input(s): AMMONIA in the last 168 hours. Coagulation Profile: No results for input(s): INR, PROTIME in the last 168 hours. Cardiac Enzymes: No results for input(s): CKTOTAL, CKMB, CKMBINDEX, TROPONINI in the last 168 hours. BNP (last 3 results) No results for input(s): PROBNP in the last 8760 hours. HbA1C: No results for input(s): HGBA1C in the  last 72 hours. CBG: Recent Labs  Lab 12/16/17 1139 12/16/17 1621 12/16/17 2108 12/17/17 0744 12/17/17 1239  GLUCAP 167* 92 115* 130* 138*   Lipid Profile: No results for input(s): CHOL, HDL, LDLCALC, TRIG, CHOLHDL, LDLDIRECT in the last 72 hours. Thyroid Function Tests: Recent Labs    12/15/17 0400  TSH 1.324   Anemia Panel: No results for input(s): VITAMINB12, FOLATE, FERRITIN, TIBC, IRON, RETICCTPCT in the last 72 hours. Sepsis Labs: No results for input(s): PROCALCITON, LATICACIDVEN in the last 168 hours.  Recent Results (from the past 240 hour(s))  Urine culture     Status: Abnormal   Collection Time: 12/14/17  5:25 PM  Result Value Ref Range Status   Specimen Description   Final    URINE, RANDOM Performed at [facility], [place] [address], [place], [place] [address]    Special Requests   Final    NONE Performed at [facility], [place] [address], [place], [place] [address]    Culture MULTIPLE SPECIES PRESENT, SUGGEST RECOLLECTION (A)  Final   Report Status 12/16/2017 FINAL  Final         Radiology Studies: Vas [place] Upper Extremity Venous Duplex  Result Date: 12/16/2017 UPPER VENOUS STUDY  Indications: Pain Performing Technologist: Toma Copier RVS  Examination Guidelines: A complete evaluation includes B-mode imaging, spectral Doppler, color Doppler, and power Doppler as needed of all accessible portions of each vessel. Bilateral testing is considered an integral part of a complete examination. Limited examinations for reoccurring indications may be performed as noted.  Right Findings: +----------+------------+----------+---------+-----------+---------------------+ RIGHT     CompressiblePropertiesPhasicitySpontaneous       Summary        +----------+------------+----------+---------+-----------+---------------------+ Subclavian                                          Unable to adequately                                                       image due to clavicle +----------+------------+----------+---------+-----------+---------------------+  Left Findings: +--------+------------+----------+---------+-----------+-----------------------+ LEFT    CompressiblePropertiesPhasicitySpontaneous        Summary         +--------+------------+----------+---------+-----------+-----------------------+ IJV         Full                 Yes       Yes                            +--------+------------+----------+---------+-----------+-----------------------+ Axillary    Full                 Yes       Yes                            +--------+------------+----------+---------+-----------+-----------------------+ Brachial    Full                 Yes       Yes                            +--------+------------+----------+---------+-----------+-----------------------+ Radial      Full                                                          +--------+------------+----------+---------+-----------+-----------------------+ Ulnar       Full                                                          +--------+------------+----------+---------+-----------+-----------------------+ Cephalic    None  Acute from [facility]                                                            Proximal         +--------+------------+----------+---------+-----------+-----------------------+ Basilic     Full                                                          +--------+------------+----------+---------+-----------+-----------------------+  Summary:  Left: No evidence of deep vein thrombosis in the upper extremity. No evidence of thrombosis in the subclavian. Findings consistent with acute superficial vein thrombosis involving the left cephalic vein.  *See table(s) above for measurements and observations.  Diagnosing physician: Monica Martinez MD Electronically signed  by Monica Martinez MD on 12/16/2017 at 3:38:53 PM.    Final         Scheduled Meds: . aspirin EC  81 mg Oral Daily  . cholecalciferol  1,000 Units Oral BID  . collagenase   Topical Daily  . feeding supplement (ENSURE ENLIVE)  237 mL Oral TID BM  . gabapentin  100 mg Oral TID  . heparin  5,000 Units Subcutaneous Q8H  . insulin aspart  0-9 Units Subcutaneous TID WC  . insulin aspart protamine- aspart  10 Units Subcutaneous BID WC  . mirtazapine  7.5 mg Oral QHS  . multivitamin with minerals  1 tablet Oral Daily  . polyethylene glycol  17 g Oral Daily  . tamsulosin  0.4 mg Oral Daily  . vitamin B-12  1,000 mcg Oral Daily   Continuous Infusions:    LOS: 3 days    Time spent: over 30 min    Fayrene Helper, MD [facility] Pager [phone]  If 7PM-7AM, please contact night-coverage [email or web address] Password [facility] 12/17/2017, 3:04 PM

## 2017-12-18 LAB — GLUCOSE, CAPILLARY
GLUCOSE-CAPILLARY: 218 mg/dL — AB (ref 70–99)
Glucose-Capillary: 139 mg/dL — ABNORMAL HIGH (ref 70–99)
Glucose-Capillary: 158 mg/dL — ABNORMAL HIGH (ref 70–99)
Glucose-Capillary: 157 mg/dL — ABNORMAL HIGH (ref 70–99)

## 2017-12-18 LAB — BASIC METABOLIC PANEL
ANION GAP: 8 (ref 5–15)
BUN: 13 mg/dL (ref 8–23)
CALCIUM: 8.1 mg/dL — AB (ref 8.9–10.3)
CO2: 25 mmol/L (ref 22–32)
Chloride: 106 mmol/L (ref 98–111)
Creatinine, Ser: 0.61 mg/dL (ref 0.61–1.24)
GFR calc non Af Amer: >60 mL/min (ref >60)
GLUCOSE: 120 mg/dL — AB (ref 70–99)
POTASSIUM: 4.4 mmol/L (ref 3.5–5.1)
SODIUM: 139 mmol/L (ref 135–145)

## 2017-12-18 LAB — CBC
HCT: 27.6 % — ABNORMAL LOW (ref 39.0–52.0)
HEMOGLOBIN: 8.2 g/dL — AB (ref 13.0–17.0)
MCH: 26.6 pg (ref 26.0–34.0)
MCHC: 29.7 g/dL — AB (ref 30.0–36.0)
MCV: 89.6 fL (ref 80.0–100.0)
NRBC: 0 % (ref 0.0–0.2)
Platelets: 314 10*3/uL (ref 150–400)
RBC: 3.08 MIL/uL — AB (ref 4.22–5.81)
RDW: 16.2 % — ABNORMAL HIGH (ref 11.5–15.5)
WBC: 9 10*3/uL (ref 4.0–10.5)

## 2017-12-18 LAB — MAGNESIUM: Magnesium: 1.7 mg/dL (ref 1.7–2.4)

## 2017-12-18 NOTE — Progress Notes (Signed)
PROGRESS NOTE    Michael [name]  VOJ:[id number] DOB: [birth year] DOA: 12/14/2017 PCP: Jani Gravel, MD   Brief Narrative:  Michael [name] is Michael [name] 84 y.o. male with medical history significant of CLL currently on treatment with ibrutinib, recent admission to the hospital for incarcerated left inguinal hernia from 11/10/2017 to 99/37/1696 with complications including re-incarceration with postoperative ileus and kidney failure,.  History of pancytopenia secondary to chemotherapy, history of skin ulcers who came to [place] for follow-up from the nursing facility today.  While there he was found to have systolic blood pressure in the 60s.  No fever or chills no obvious evidence of sepsis.  Patient was sent to the ER for further evaluation.  He has been given multiple doses of IV fluids but still remains mildly hypotensive.  Systolic has finally reached 106 at the moment.  Patient noted to have hyponatremia with hypokalemia.  His creatinine is still elevated.  He reported poor oral intake since going to the facility [facility] week ago.  He feels weak and tired as well as debilitated.  Patient is being admitted for evaluation of these hypotension.  Assessment & Plan:   Principal Problem:   Hypotension Active Problems:   CLL (chronic lymphocytic leukemia) (HCC)   Essential hypertension   Pressure ulcer   Hyponatremia   ARF (acute renal failure) (HCC)   #1 Hypotension: Patient's hypotension likely due to poor oral intake and dehydration.  He responded to IVF.  Question of BP medication for pt, but I only see tamsulosin on his home meds.  Oxycodone could also contribute to hypotension.  His BP's continue to be improved.  Stable    # Poor Oral Intake: this seems to be improving. - remeron at 7.5, continue to monitor.  # Possible Urinary Tract Infection: follow final culture results (growing multiple species).  UA with + LE, negative nitrite, few bacteria, but >50 wbc's.  Will d/c abx.  # LUE  superficial thrombophlebitis: pt with no DVT, acute superficial vein thrombosis of L cephalic vein.  Continue to monitor.  Ice prn.  #2 acute kidney injury: improved with IV fluids  #3 CLL: Continue treatment per oncology.  Currently on ibrutinib, followed by Dr. Alvy Bimler.  On hold at this time.  Will need to follow up with dr. Alvy Bimler about timing of resumption of ibrutinib.  #4 hyponatremia: resolved  # Anemia: relatively stable, follow  # T2DM: continue 70/30 and SSI (watch closely for hypoglycemia with NPH and poor PO intake - decreased to 10 units BID, doing well with this dose at this time)  # Decubitus Ulcer: appreciate wound care recs - nutrition consult  DVT prophylaxis: heparin Code Status: full code Family Communication: daughter at bedside Disposition Plan: pending placement   Consultants:   none  Procedures:   none  Antimicrobials:  Anti-infectives (From admission, onward)   Start     Dose/Rate Route Frequency Ordered Stop   12/15/17 2100  cefTRIAXone (ROCEPHIN) 1 g in sodium chloride 0.9 % 100 mL IVPB  Status:  Discontinued     1 g 200 mL/hr over 30 Minutes Intravenous Every 24 hours 12/15/17 1800 12/16/17 1314   12/14/17 1815  cefTRIAXone (ROCEPHIN) 1 g in sodium chloride 0.9 % 100 mL IVPB     1 g 200 mL/hr over 30 Minutes Intravenous  Once 12/14/17 1814 12/14/17 2156     Subjective: Feels better. Still L buttock pain. Otherwise, no complaints. Daughter at bedside.  Objective: Vitals:   12/17/17 1435 12/17/17 2308  12/18/17 0518 12/18/17 1437  BP: 101/64 117/61 (!) 116/52 (!) 125/54  Pulse: 71 85 71 80  Resp: 20 16 18    Temp: 98.5 F (36.9 C) 99.1 F (37.3 C) 98.8 F (37.1 C) 97.7 F (36.5 C)  TempSrc: Oral Oral Oral Oral  SpO2: 99% 98% 99% 99%  Weight:      Height:        Intake/Output Summary (Last 24 hours) at 12/18/2017 1941 Last data filed at 12/18/2017 1100 Gross per 24 hour  Intake 300 ml  Output 1300 ml  Net -1000 ml   Filed  Weights   12/14/17 1641 12/15/17 1814  Weight: 70.3 kg 71.7 kg    Examination:  General: No acute distress. Cardiovascular: Heart sounds show Carrina Schoenberger regular rate, and rhythm Lungs: Clear to auscultation bilaterally with good air movement. Abdomen: Soft, nontender, nondistended Neurological: Alert and oriented 3. Moves all extremities 4. Cranial nerves II through XII grossly intact. Skin:wound not examined today Extremities: No clubbing or cyanosis. No edema.  Psychiatric: Mood and affect are normal. Insight and judgment are appropriate.   Data Reviewed: I have personally reviewed following labs and imaging studies  CBC: Recent Labs  Lab 12/14/17 1448 12/15/17 0400 12/16/17 0559 12/17/17 0514 12/18/17 0453  WBC 9.5 7.9 7.8 9.3 9.0  NEUTROABS 4.5  --   --   --   --   HGB 9.1* 8.1* 8.4* 8.6* 8.2*  HCT 29.7* 26.7* 28.5* 29.1* 27.6*  MCV 85.8 87.8 88.2 87.1 89.6  PLT 265 257 292 315 299   Basic Metabolic Panel: Recent Labs  Lab 12/14/17 1448 12/15/17 0400 12/16/17 0559 12/17/17 0514 12/18/17 0453  NA 133* 135 140 137 139  K 4.8 3.9 4.6 4.6 4.4  CL 97* 101 106 104 106  CO2 25 27 25 26 25   GLUCOSE 134* 108* 136* 146* 120*  BUN 37* 31* 13 13 13   CREATININE 1.47* 0.83 0.57* 0.79 0.61  CALCIUM 8.4* 7.8* 8.0* 8.2* 8.1*  MG  --   --  1.7 1.7 1.7   GFR: Estimated Creatinine Clearance: 63.1 mL/min (by C-G formula based on SCr of 0.61 mg/dL). Liver Function Tests: Recent Labs  Lab 12/14/17 1448 12/15/17 0400 12/17/17 0514  AST 21 16 17   ALT 14 15 14   ALKPHOS 83 58 57  BILITOT <0.2* 0.4 0.4  PROT 6.2* 5.2* 5.3*  ALBUMIN 2.0* 1.9* 1.9*   No results for input(s): LIPASE, AMYLASE in the last 168 hours. No results for input(s): AMMONIA in the last 168 hours. Coagulation Profile: No results for input(s): INR, PROTIME in the last 168 hours. Cardiac Enzymes: No results for input(s): CKTOTAL, CKMB, CKMBINDEX, TROPONINI in the last 168 hours. BNP (last 3 results) No  results for input(s): PROBNP in the last 8760 hours. HbA1C: No results for input(s): HGBA1C in the last 72 hours. CBG: Recent Labs  Lab 12/17/17 1613 12/17/17 2315 12/18/17 0812 12/18/17 1120 12/18/17 1655  GLUCAP 147* 79 139* 218* 158*   Lipid Profile: No results for input(s): CHOL, HDL, LDLCALC, TRIG, CHOLHDL, LDLDIRECT in the last 72 hours. Thyroid Function Tests: No results for input(s): TSH, T4TOTAL, FREET4, T3FREE, THYROIDAB in the last 72 hours. Anemia Panel: No results for input(s): VITAMINB12, FOLATE, FERRITIN, TIBC, IRON, RETICCTPCT in the last 72 hours. Sepsis Labs: No results for input(s): PROCALCITON, LATICACIDVEN in the last 168 hours.  Recent Results (from the past 240 hour(s))  Urine culture     Status: Abnormal   Collection Time: 12/14/17  5:25  PM  Result Value Ref Range Status   Specimen Description   Final    URINE, RANDOM Performed at [facility] [address] [place], [place] [address]    Special Requests   Final    NONE Performed at [facility], [place] [address] [place], [place] [address]    Culture MULTIPLE SPECIES PRESENT, SUGGEST RECOLLECTION (Leaira Fullam)  Final   Report Status 12/16/2017 FINAL  Final         Radiology Studies: No results found.      Scheduled Meds: . aspirin EC  81 mg Oral Daily  . cholecalciferol  1,000 Units Oral BID  . collagenase   Topical Daily  . feeding supplement (ENSURE ENLIVE)  237 mL Oral TID BM  . gabapentin  100 mg Oral TID  . heparin  5,000 Units Subcutaneous Q8H  . insulin aspart  0-9 Units Subcutaneous TID WC  . insulin aspart protamine- aspart  10 Units Subcutaneous BID WC  . mirtazapine  7.5 mg Oral QHS  . multivitamin with minerals  1 tablet Oral Daily  . polyethylene glycol  17 g Oral Daily  . tamsulosin  0.4 mg Oral Daily  . vitamin B-12  1,000 mcg Oral Daily   Continuous Infusions:    LOS: 4 days    Time spent: over 30 min    Fayrene Helper,  MD [facility] Pager [phone]  If 7PM-7AM, please contact night-coverage [email or web address] Password [facility] 12/18/2017, 7:41 PM

## 2017-12-19 LAB — BASIC METABOLIC PANEL
Anion gap: 6 (ref 5–15)
BUN: 16 mg/dL (ref 8–23)
CHLORIDE: 107 mmol/L (ref 98–111)
CO2: 29 mmol/L (ref 22–32)
CREATININE: 0.71 mg/dL (ref 0.61–1.24)
Calcium: 8.3 mg/dL — ABNORMAL LOW (ref 8.9–10.3)
GFR calc Af Amer: >60 mL/min (ref >60)
GFR calc non Af Amer: >60 mL/min (ref >60)
GLUCOSE: 94 mg/dL (ref 70–99)
POTASSIUM: 4.4 mmol/L (ref 3.5–5.1)
Sodium: 142 mmol/L (ref 135–145)

## 2017-12-19 LAB — CBC
HCT: 28.4 % — ABNORMAL LOW (ref 39.0–52.0)
Hemoglobin: 8.4 g/dL — ABNORMAL LOW (ref 13.0–17.0)
MCH: 25.8 pg — AB (ref 26.0–34.0)
MCHC: 29.6 g/dL — AB (ref 30.0–36.0)
MCV: 87.1 fL (ref 80.0–100.0)
PLATELETS: 363 10*3/uL (ref 150–400)
RBC: 3.26 MIL/uL — AB (ref 4.22–5.81)
RDW: 16.1 % — ABNORMAL HIGH (ref 11.5–15.5)
WBC: 8.5 10*3/uL (ref 4.0–10.5)
nRBC: 0 % (ref 0.0–0.2)

## 2017-12-19 LAB — GLUCOSE, CAPILLARY
GLUCOSE-CAPILLARY: 100 mg/dL — AB (ref 70–99)
Glucose-Capillary: 97 mg/dL (ref 70–99)
Glucose-Capillary: 164 mg/dL — ABNORMAL HIGH (ref 70–99)

## 2017-12-19 LAB — MAGNESIUM: Magnesium: 1.6 mg/dL — ABNORMAL LOW (ref 1.7–2.4)

## 2017-12-19 MED ORDER — MAGNESIUM SULFATE 2 GM/50ML IV SOLN
2.0000 g | Freq: Once | INTRAVENOUS | Status: AC
  Administered 2017-12-19: 2 g via INTRAVENOUS
  Filled 2017-12-19: qty 50

## 2017-12-19 NOTE — NC FL2 (Signed)
[place] LEVEL OF CARE SCREENING TOOL     IDENTIFICATION  Patient Name: Michael [name] Birthdate: 02/10/1930 Sex: male Admission Date (Current Location): 12/14/2017  [facility] and [place] Number:  Herbalist and Address:  [facility],  [place], [place]      Provider Number: [id number]  Attending Physician Name and Address:  Elodia Florence., *  Relative Name and Phone Number:  Darci Needle, daughter, [phone]    Current Level of Care: Hospital Recommended Level of Care: [place] Prior Approval Number:    Date Approved/Denied:   PASRR Number: [id number]  Discharge Plan: SNF    Current Diagnoses: Patient Active Problem List   Diagnosis Date Noted  . Hypotension 12/14/2017  . Hyponatremia 12/14/2017  . ARF (acute renal failure) ([place]) 12/14/2017  . Pressure ulcer 11/22/2017  . Ileus ([place]) 11/14/2017  . S/P left inguinal hernia repair 11/10/2017  . Weight loss, non-intentional 04/12/2017  . Essential hypertension 10/20/2016  . Paresthesias 10/19/2016  . Left inguinal hernia 10/12/2016  . Protein-calorie malnutrition, mild ([place]) 01/10/2016  . Diarrhea due to drug 06/17/2015  . Chronic left shoulder pain 06/17/2015  . Bilateral leg edema 06/17/2015  . Pancytopenia, acquired ([place]) 04/22/2015  . Thrombocytopenia ([place]) 10/11/2014  . 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 CLL (chronic lymphocytic leukemia) ([place]) 12/08/2010    Orientation RESPIRATION BLADDER Height & Weight     Self, Time, Situation, Place  Normal Incontinent Weight: 158 lb (71.7 kg) Height:  5\' 7"  (170.2 cm)  BEHAVIORAL SYMPTOMS/MOOD NEUROLOGICAL BOWEL NUTRITION STATUS      Incontinent Diet(carb modified)  AMBULATORY STATUS COMMUNICATION OF NEEDS Skin   Extensive Assist Verbally                         Personal Care Assistance Level of Assistance  Bathing, Feeding, Dressing Bathing Assistance: Limited  assistance Feeding assistance: Limited assistance Dressing Assistance: Limited assistance     Functional Limitations Info  Sight, Hearing, Speech Sight Info: Adequate Hearing Info: Adequate Speech Info: Adequate    SPECIAL CARE FACTORS FREQUENCY  PT (By licensed PT), OT (By licensed OT)     PT Frequency: 5x wk OT Frequency: 5x wk            Contractures Contractures Info: Not present    Additional Factors Info  Code Status, Allergies, Insulin Sliding Scale Code Status Info: full code Allergies Info: no known allergies   Insulin Sliding Scale Info: insulin aspart (novoLOG) injection 0-9 Units        Current Medications (12/19/2017):  This is the current hospital active medication list Current Facility-Administered Medications  Medication Dose Route Frequency Provider Last Rate Last Dose  . acetaminophen (TYLENOL) tablet 1,000 mg  1,000 mg Oral Q8H PRN Elodia Florence., MD   1,000 mg at 12/19/17 0856  . aspirin EC tablet 81 mg  81 mg Oral Daily Gala Romney L, MD   81 mg at 12/19/17 1004  . cholecalciferol (VITAMIN D3) tablet 1,000 Units  1,000 Units Oral BID Elwyn Reach, MD   1,000 Units at 12/19/17 1004  . collagenase (SANTYL) ointment   Topical Daily Elodia Florence., MD      . docusate sodium (COLACE) capsule 100 mg  100 mg Oral BID PRN Elwyn Reach, MD   100 mg at 12/16/17 0921  . feeding supplement (ENSURE ENLIVE) (ENSURE ENLIVE) liquid 237 mL  237  mL Oral TID BM Elodia Florence., MD   237 mL at 12/19/17 1306  . gabapentin (NEURONTIN) capsule 100 mg  100 mg Oral TID Elwyn Reach, MD   100 mg at 12/19/17 1004  . heparin injection 5,000 Units  5,000 Units Subcutaneous Q8H Elwyn Reach, MD   5,000 Units at 12/19/17 1305  . insulin aspart (novoLOG) injection 0-9 Units  0-9 Units Subcutaneous TID WC Elwyn Reach, MD   1 Units at 12/19/17 1305  . insulin aspart protamine- aspart (NOVOLOG MIX 70/30) injection 10 Units  10 Units  Subcutaneous BID WC Elodia Florence., MD   10 Units at 12/19/17 [phone]  . mirtazapine (REMERON) tablet 7.5 mg  7.5 mg Oral QHS Elodia Florence., MD   7.5 mg at 12/18/17 2113  . multivitamin with minerals tablet 1 tablet  1 tablet Oral Daily Elodia Florence., MD   1 tablet at 12/19/17 1004  . ondansetron (ZOFRAN) tablet 4 mg  4 mg Oral Q6H PRN Elwyn Reach, MD       Or  . ondansetron (ZOFRAN) injection 4 mg  4 mg Intravenous Q6H PRN Gala Romney L, MD      . polyethylene glycol (MIRALAX / GLYCOLAX) packet 17 g  17 g Oral Daily Elodia Florence., MD   17 g at 12/19/17 1004  . tamsulosin (FLOMAX) capsule 0.4 mg  0.4 mg Oral Daily Gala Romney L, MD   0.4 mg at 12/19/17 1004  . vitamin B-12 (CYANOCOBALAMIN) tablet 1,000 mcg  1,000 mcg Oral Daily Elwyn Reach, MD   1,000 mcg at 12/19/17 1004     Discharge Medications: Please see discharge summary for a list of discharge medications.  Relevant Imaging Results:  Relevant Lab Results:   Additional Information SS#[id number]  Wende Neighbors, [place]

## 2017-12-19 NOTE — Progress Notes (Signed)
PROGRESS NOTE    Michael [name]  WVP:[id number] DOB: [birth year] DOA: 12/14/2017 PCP: Jani Gravel, MD   Brief Narrative:  Michael [name] is Michael [name] 84 y.o. male with medical history significant of CLL currently on treatment with ibrutinib, recent admission to the hospital for incarcerated left inguinal hernia from 11/10/2017 to 54/62/7035 with complications including re-incarceration with postoperative ileus and kidney failure,.  History of pancytopenia secondary to chemotherapy, history of skin ulcers who came to [facility] for follow-up from the nursing facility today.  While there he was found to have systolic blood pressure in the 60s.  No fever or chills no obvious evidence of sepsis.  Patient was sent to the ER for further evaluation.  He has been given multiple doses of IV fluids but still remains mildly hypotensive.  Systolic has finally reached 106 at the moment.  Patient noted to have hyponatremia with hypokalemia.  His creatinine is still elevated.  He reported poor oral intake since going to the facility [name] week ago.  He feels weak and tired as well as debilitated.  Patient is being admitted for evaluation of these hypotension.  Assessment & Plan:   Principal Problem:   Hypotension Active Problems:   CLL (chronic lymphocytic leukemia) (HCC)   Essential hypertension   Pressure ulcer   Hyponatremia   ARF (acute renal failure) (HCC)   #1 Hypotension: Patient's hypotension likely due to poor oral intake and dehydration.  He responded to IVF.  Question of BP medication for pt, but I only see tamsulosin on his home meds.  Oxycodone could also contribute to hypotension.  His BP's continue to be improved.  Stable.  # Poor Oral Intake: this seems to be improving. - remeron at 7.5, continue to monitor.  # Possible Urinary Tract Infection: follow final culture results (growing multiple species).  UA with + LE, negative nitrite, few bacteria, but >50 wbc's.  Will d/c abx.  # LUE superficial  thrombophlebitis: pt with no DVT, acute superficial vein thrombosis of L cephalic vein.  Continue to monitor.  Ice prn.  #2 acute kidney injury: improved with IV fluids  #3 CLL: Continue treatment per oncology.  Currently on ibrutinib, followed by Dr. Alvy Bimler.  On hold at this time.  Will need to follow up with dr. Alvy Bimler about timing of resumption of ibrutinib.  #4 hyponatremia: resolved  # Anemia: relatively stable, follow  # T2DM: continue 70/30 and SSI (watch closely for hypoglycemia with NPH and poor PO intake - decreased to 10 units BID, doing well with this dose at this time)  # Decubitus Ulcer: appreciate wound care recs - nutrition consult  # L inguinal hernia repair: missed his follow up with surgery, this needs to be rescheduled   DVT prophylaxis: heparin Code Status: full code Family Communication: daughter at bedside Disposition Plan: pending placement   Consultants:   none  Procedures:   none  Antimicrobials:  Anti-infectives (From admission, onward)   Start     Dose/Rate Route Frequency Ordered Stop   12/15/17 2100  cefTRIAXone (ROCEPHIN) 1 g in sodium chloride 0.9 % 100 mL IVPB  Status:  Discontinued     1 g 200 mL/hr over 30 Minutes Intravenous Every 24 hours 12/15/17 1800 12/16/17 1314   12/14/17 1815  cefTRIAXone (ROCEPHIN) 1 g in sodium chloride 0.9 % 100 mL IVPB     1 g 200 mL/hr over 30 Minutes Intravenous  Once 12/14/17 1814 12/14/17 2156     Subjective: Feels better, but not good  No other concerns today. duaghter and friend at bedside  Objective: Vitals:   12/18/17 0518 12/18/17 1437 12/18/17 2045 12/19/17 0529  BP: (!) 116/52 (!) 125/54 (!) 127/59 (!) 111/54  Pulse: 71 80 87 70  Resp: 18  18 18   Temp: 98.8 F (37.1 C) 97.7 F (36.5 C) 100.1 F (37.8 C) 97.6 F (36.4 C)  TempSrc: Oral Oral Oral Oral  SpO2: 99% 99% 100% 98%  Weight:      Height:        Intake/Output Summary (Last 24 hours) at 12/19/2017 1642 Last data filed at  12/19/2017 1000 Gross per 24 hour  Intake 250 ml  Output 1500 ml  Net -1250 ml   Filed Weights   12/14/17 1641 12/15/17 1814  Weight: 70.3 kg 71.7 kg    Examination:  General: No acute distress. Cardiovascular: Heart sounds show [name] regular rate, and rhythm Lungs: Clear to auscultation bilaterally Abdomen: Soft, nontender, nondistended  Neurological: Alert and oriented 3. Moves all extremities 4. Cranial nerves II through XII grossly intact. Skin: decubitus ulcers not examined today Extremities: No clubbing or cyanosis. No edema.  Psychiatric: Mood and affect are normal. Insight and judgment are appropriate.   Data Reviewed: I have personally reviewed following labs and imaging studies  CBC: Recent Labs  Lab 12/14/17 1448 12/15/17 0400 12/16/17 0559 12/17/17 0514 12/18/17 0453 12/19/17 0525  WBC 9.5 7.9 7.8 9.3 9.0 8.5  NEUTROABS 4.5  --   --   --   --   --   HGB 9.1* 8.1* 8.4* 8.6* 8.2* 8.4*  HCT 29.7* 26.7* 28.5* 29.1* 27.6* 28.4*  MCV 85.8 87.8 88.2 87.1 89.6 87.1  PLT 265 257 292 315 314 518   Basic Metabolic Panel: Recent Labs  Lab 12/15/17 0400 12/16/17 0559 12/17/17 0514 12/18/17 0453 12/19/17 0525  NA 135 140 137 139 142  K 3.9 4.6 4.6 4.4 4.4  CL 101 106 104 106 107  CO2 27 25 26 25 29   GLUCOSE 108* 136* 146* 120* 94  BUN 31* 13 13 13 16   CREATININE 0.83 0.57* 0.79 0.61 0.71  CALCIUM 7.8* 8.0* 8.2* 8.1* 8.3*  MG  --  1.7 1.7 1.7 1.6*   GFR: Estimated Creatinine Clearance: 63.1 mL/min (by C-G formula based on SCr of 0.71 mg/dL). Liver Function Tests: Recent Labs  Lab 12/14/17 1448 12/15/17 0400 12/17/17 0514  AST 21 16 17   ALT 14 15 14   ALKPHOS 83 58 57  BILITOT <0.2* 0.4 0.4  PROT 6.2* 5.2* 5.3*  ALBUMIN 2.0* 1.9* 1.9*   No results for input(s): LIPASE, AMYLASE in the last 168 hours. No results for input(s): AMMONIA in the last 168 hours. Coagulation Profile: No results for input(s): INR, PROTIME in the last 168 hours. Cardiac  Enzymes: No results for input(s): CKTOTAL, CKMB, CKMBINDEX, TROPONINI in the last 168 hours. BNP (last 3 results) No results for input(s): PROBNP in the last 8760 hours. HbA1C: No results for input(s): HGBA1C in the last 72 hours. CBG: Recent Labs  Lab 12/18/17 0812 12/18/17 1120 12/18/17 1655 12/18/17 2042 12/19/17 0805  GLUCAP 139* 218* 158* 157* 97   Lipid Profile: No results for input(s): CHOL, HDL, LDLCALC, TRIG, CHOLHDL, LDLDIRECT in the last 72 hours. Thyroid Function Tests: No results for input(s): TSH, T4TOTAL, FREET4, T3FREE, THYROIDAB in the last 72 hours. Anemia Panel: No results for input(s): VITAMINB12, FOLATE, FERRITIN, TIBC, IRON, RETICCTPCT in the last 72 hours. Sepsis Labs: No results for input(s): PROCALCITON, LATICACIDVEN in the  last 168 hours.  Recent Results (from the past 240 hour(s))  Urine culture     Status: Abnormal   Collection Time: 12/14/17  5:25 PM  Result Value Ref Range Status   Specimen Description   Final    URINE, RANDOM Performed at [facility] [address], [place], [place] [address]    Special Requests   Final    NONE Performed at [facility], [place] [address], [place], [place] [address]    Culture MULTIPLE SPECIES PRESENT, SUGGEST RECOLLECTION ([name])  Final   Report Status 12/16/2017 FINAL  Final         Radiology Studies: No results found.      Scheduled Meds: . aspirin EC  81 mg Oral Daily  . cholecalciferol  1,000 Units Oral BID  . collagenase   Topical Daily  . feeding supplement (ENSURE ENLIVE)  237 mL Oral TID BM  . gabapentin  100 mg Oral TID  . heparin  5,000 Units Subcutaneous Q8H  . insulin aspart  0-9 Units Subcutaneous TID WC  . insulin aspart protamine- aspart  10 Units Subcutaneous BID WC  . mirtazapine  7.5 mg Oral QHS  . multivitamin with minerals  1 tablet Oral Daily  . polyethylene glycol  17 g Oral Daily  . tamsulosin  0.4 mg Oral Daily  . vitamin B-12   1,000 mcg Oral Daily   Continuous Infusions: . magnesium sulfate 1 - 4 g bolus IVPB       LOS: 5 days    Time spent: over 30 min    Fayrene Helper, MD [facility] Pager [phone]  If 7PM-7AM, please contact night-coverage [email or web address] Password [facility] 12/19/2017, 4:42 PM

## 2017-12-20 LAB — BASIC METABOLIC PANEL
Anion gap: 5 (ref 5–15)
BUN: 15 mg/dL (ref 8–23)
CO2: 29 mmol/L (ref 22–32)
CREATININE: 0.66 mg/dL (ref 0.61–1.24)
Calcium: 8.3 mg/dL — ABNORMAL LOW (ref 8.9–10.3)
Chloride: 106 mmol/L (ref 98–111)
GFR calc Af Amer: >60 mL/min (ref >60)
Glucose, Bld: 119 mg/dL — ABNORMAL HIGH (ref 70–99)
Potassium: 4.4 mmol/L (ref 3.5–5.1)
SODIUM: 140 mmol/L (ref 135–145)

## 2017-12-20 LAB — GLUCOSE, CAPILLARY
Glucose-Capillary: 136 mg/dL — ABNORMAL HIGH (ref 70–99)
Glucose-Capillary: 133 mg/dL — ABNORMAL HIGH (ref 70–99)
Glucose-Capillary: 204 mg/dL — ABNORMAL HIGH (ref 70–99)
Glucose-Capillary: 157 mg/dL — ABNORMAL HIGH (ref 70–99)
Glucose-Capillary: 159 mg/dL — ABNORMAL HIGH (ref 70–99)

## 2017-12-20 LAB — CBC
HCT: 28.2 % — ABNORMAL LOW (ref 39.0–52.0)
Hemoglobin: 8.4 g/dL — ABNORMAL LOW (ref 13.0–17.0)
MCH: 25.8 pg — AB (ref 26.0–34.0)
MCHC: 29.8 g/dL — AB (ref 30.0–36.0)
MCV: 86.5 fL (ref 80.0–100.0)
PLATELETS: 365 10*3/uL (ref 150–400)
RBC: 3.26 MIL/uL — ABNORMAL LOW (ref 4.22–5.81)
RDW: 15.9 % — ABNORMAL HIGH (ref 11.5–15.5)
WBC: 8.2 10*3/uL (ref 4.0–10.5)
nRBC: 0 % (ref 0.0–0.2)

## 2017-12-20 LAB — MAGNESIUM: MAGNESIUM: 1.8 mg/dL (ref 1.7–2.4)

## 2017-12-20 MED ORDER — MIRTAZAPINE 15 MG PO TABS
15.0000 mg | ORAL_TABLET | Freq: Every day | ORAL | Status: DC
  Administered 2017-12-20 – 2017-12-21 (×2): 15 mg via ORAL
  Filled 2017-12-20 (×2): qty 1

## 2017-12-20 MED ORDER — PANTOPRAZOLE SODIUM 40 MG PO TBEC
40.0000 mg | DELAYED_RELEASE_TABLET | Freq: Every day | ORAL | Status: DC
  Administered 2017-12-20 – 2017-12-22 (×3): 40 mg via ORAL
  Filled 2017-12-20 (×4): qty 1

## 2017-12-20 NOTE — Progress Notes (Signed)
PROGRESS NOTE    Michael [name]  GHW:[id number] DOB: [birth year] DOA: 12/14/2017 PCP: Jani Gravel, MD   Brief Narrative:  Michael [name] is Michael [name] 83 y.o. male with medical history significant of CLL currently on treatment with ibrutinib, recent admission to the hospital for incarcerated left inguinal hernia from 11/10/2017 to 78/93/8101 with complications including re-incarceration with postoperative ileus and kidney failure,.  History of pancytopenia secondary to chemotherapy, history of skin ulcers who came to [facility] for follow-up from the nursing facility today.  While there he was found to have systolic blood pressure in the 60s.  No fever or chills no obvious evidence of sepsis.  Patient was sent to the ER for further evaluation.  He has been given multiple doses of IV fluids but still remains mildly hypotensive.  Systolic has finally reached 106 at the moment.  Patient noted to have hyponatremia with hypokalemia.  His creatinine is still elevated.  He reported poor oral intake since going to the facility [name] week ago.  He feels weak and tired as well as debilitated.  Patient is being admitted for evaluation of these hypotension.  Assessment & Plan:   Principal Problem:   Hypotension Active Problems:   CLL (chronic lymphocytic leukemia) (HCC)   Essential hypertension   Pressure ulcer   Hyponatremia   ARF (acute renal failure) (HCC)   #1 Hypotension: Patient's hypotension likely due to poor oral intake and dehydration.  He responded to IVF.  Question of BP medication for pt, but I only see tamsulosin on his home meds.  Oxycodone could also contribute to hypotension.  His BP's continue to be improved.  Stable.  # Poor Oral Intake: this seems to be improving. - remeron at 15 mg, continue to monitor.  # Possible Urinary Tract Infection: follow final culture results (growing multiple species).  UA with + LE, negative nitrite, few bacteria, but >50 wbc's.  Will d/c abx.  # LUE  superficial thrombophlebitis: pt with no DVT, acute superficial vein thrombosis of L cephalic vein.  Continue to monitor.  Ice prn.  #2 acute kidney injury: improved with IV fluids  #3 CLL: Continue treatment per oncology.  Currently on ibrutinib, followed by Dr. Alvy Bimler.  On hold at this time.  Will need to follow up with dr. Alvy Bimler about timing of resumption of ibrutinib.  #4 hyponatremia: resolved  # Anemia: relatively stable, follow  # T2DM: continue 70/30 and SSI (watch closely for hypoglycemia with NPH and poor PO intake - decreased to 10 units BID, doing well with this dose at this time)  # Decubitus Ulcer: appreciate wound care recs - nutrition consult  # L inguinal hernia repair: missed his follow up with surgery, this needs to be rescheduled   DVT prophylaxis: heparin Code Status: full code Family Communication: daughter at bedside Disposition Plan: pending placement   Consultants:   none  Procedures:   none  Antimicrobials:  Anti-infectives (From admission, onward)   Start     Dose/Rate Route Frequency Ordered Stop   12/15/17 2100  cefTRIAXone (ROCEPHIN) 1 g in sodium chloride 0.9 % 100 mL IVPB  Status:  Discontinued     1 g 200 mL/hr over 30 Minutes Intravenous Every 24 hours 12/15/17 1800 12/16/17 1314   12/14/17 1815  cefTRIAXone (ROCEPHIN) 1 g in sodium chloride 0.9 % 100 mL IVPB     1 g 200 mL/hr over 30 Minutes Intravenous  Once 12/14/17 1814 12/14/17 2156     Subjective: Feeling ok today. Got  up and walked with PT.  Got to chair.  Daughter at bedside  Objective: Vitals:   12/19/17 2128 12/20/17 0544 12/20/17 1134 12/20/17 1350  BP: 123/60 (!) 109/55  (!) 108/58  Pulse: 88 82  98  Resp: 16 16  14   Temp: 98.9 F (37.2 C) 98.9 F (37.2 C)  98.7 F (37.1 C)  TempSrc: Oral Oral  Oral  SpO2: 99% 100% 95% 99%  Weight:      Height:        Intake/Output Summary (Last 24 hours) at 12/20/2017 1840 Last data filed at 12/20/2017 1730 Gross per  24 hour  Intake 610 ml  Output 1600 ml  Net -990 ml   Filed Weights   12/14/17 1641 12/15/17 1814  Weight: 70.3 kg 71.7 kg    Examination:  General: No acute distress. Cardiovascular: Heart sounds show [name] regular rate, and rhythm. Lungs: Clear to auscultation bilaterally Abdomen: Soft, nontender, nondistended Neurological: Alert and oriented 3. Moves all extremities 4. Cranial nerves II through XII grossly intact. Skin: decub not examined today Extremities: No clubbing or cyanosis. No edema. Psychiatric: Mood and affect are normal. Insight and judgment are appropriate.   Data Reviewed: I have personally reviewed following labs and imaging studies  CBC: Recent Labs  Lab 12/14/17 1448  12/16/17 0559 12/17/17 0514 12/18/17 0453 12/19/17 0525 12/20/17 0531  WBC 9.5   < > 7.8 9.3 9.0 8.5 8.2  NEUTROABS 4.5  --   --   --   --   --   --   HGB 9.1*   < > 8.4* 8.6* 8.2* 8.4* 8.4*  HCT 29.7*   < > 28.5* 29.1* 27.6* 28.4* 28.2*  MCV 85.8   < > 88.2 87.1 89.6 87.1 86.5  PLT 265   < > 292 315 314 363 365   < > = values in this interval not displayed.   Basic Metabolic Panel: Recent Labs  Lab 12/16/17 0559 12/17/17 0514 12/18/17 0453 12/19/17 0525 12/20/17 0531  NA 140 137 139 142 140  K 4.6 4.6 4.4 4.4 4.4  CL 106 104 106 107 106  CO2 25 26 25 29 29   GLUCOSE 136* 146* 120* 94 119*  BUN 13 13 13 16 15   CREATININE 0.57* 0.79 0.61 0.71 0.66  CALCIUM 8.0* 8.2* 8.1* 8.3* 8.3*  MG 1.7 1.7 1.7 1.6* 1.8   GFR: Estimated Creatinine Clearance: 63.1 mL/min (by C-G formula based on SCr of 0.66 mg/dL). Liver Function Tests: Recent Labs  Lab 12/14/17 1448 12/15/17 0400 12/17/17 0514  AST 21 16 17   ALT 14 15 14   ALKPHOS 83 58 57  BILITOT <0.2* 0.4 0.4  PROT 6.2* 5.2* 5.3*  ALBUMIN 2.0* 1.9* 1.9*   No results for input(s): LIPASE, AMYLASE in the last 168 hours. No results for input(s): AMMONIA in the last 168 hours. Coagulation Profile: No results for input(s): INR,  PROTIME in the last 168 hours. Cardiac Enzymes: No results for input(s): CKTOTAL, CKMB, CKMBINDEX, TROPONINI in the last 168 hours. BNP (last 3 results) No results for input(s): PROBNP in the last 8760 hours. HbA1C: No results for input(s): HGBA1C in the last 72 hours. CBG: Recent Labs  Lab 12/19/17 1657 12/19/17 2130 12/20/17 0842 12/20/17 1225 12/20/17 1727  GLUCAP 164* 100* 133* 204* 157*   Lipid Profile: No results for input(s): CHOL, HDL, LDLCALC, TRIG, CHOLHDL, LDLDIRECT in the last 72 hours. Thyroid Function Tests: No results for input(s): TSH, T4TOTAL, FREET4, T3FREE, THYROIDAB in the last  72 hours. Anemia Panel: No results for input(s): VITAMINB12, FOLATE, FERRITIN, TIBC, IRON, RETICCTPCT in the last 72 hours. Sepsis Labs: No results for input(s): PROCALCITON, LATICACIDVEN in the last 168 hours.  Recent Results (from the past 240 hour(s))  Urine culture     Status: Abnormal   Collection Time: 12/14/17  5:25 PM  Result Value Ref Range Status   Specimen Description   Final    URINE, RANDOM Performed at [facility] [address] [place], [place] [address]    Special Requests   Final    NONE Performed at [facility], [place] [address] [place], [place] [address]    Culture MULTIPLE SPECIES PRESENT, SUGGEST RECOLLECTION (Jazilyn Siegenthaler)  Final   Report Status 12/16/2017 FINAL  Final         Radiology Studies: No results found.      Scheduled Meds: . aspirin EC  81 mg Oral Daily  . cholecalciferol  1,000 Units Oral BID  . collagenase   Topical Daily  . feeding supplement (ENSURE ENLIVE)  237 mL Oral TID BM  . gabapentin  100 mg Oral TID  . heparin  5,000 Units Subcutaneous Q8H  . insulin aspart  0-9 Units Subcutaneous TID WC  . insulin aspart protamine- aspart  10 Units Subcutaneous BID WC  . mirtazapine  15 mg Oral QHS  . multivitamin with minerals  1 tablet Oral Daily  . pantoprazole  40 mg Oral Daily  . polyethylene  glycol  17 g Oral Daily  . tamsulosin  0.4 mg Oral Daily  . vitamin B-12  1,000 mcg Oral Daily   Continuous Infusions:    LOS: 6 days    Time spent: over 30 min    Fayrene Helper, MD [facility] Pager [phone]  If 7PM-7AM, please contact night-coverage [email or web address] Password [facility] 12/20/2017, 6:40 PM

## 2017-12-20 NOTE — Progress Notes (Signed)
Physical Therapy Treatment Patient Details Name: Michael [name] MRN: [id number] DOB: 1931/01/07 Today's Date: 12/20/2017    History of Present Illness 84 y.o. male with medical history significant of CLL currently on treatment with ibrutinib, recent admission to the hospital for incarcerated left inguinal hernia from 11/10/2017 to 09/38/1829 with complications including re-incarceration with postoperative ileus and kidney failure and admitted from [facility] for hypotension    PT Comments    Pt feeling better.  Assisted OOB to amb a greater distance using a walker.  General Gait Details: mild unsteady gait but tolerated an increased distance.  Pt's goal is to return to prior level of mobility with no AD Pt will need ST Rehab to regain prior level of Indep mobility.   Follow Up Recommendations  SNF     Equipment Recommendations  None recommended by PT    Recommendations for Other Services       Precautions / Restrictions Precautions Precautions: Fall Precaution Comments: recent Hernia repair    Mobility  Bed Mobility Overal bed mobility: Needs Assistance Bed Mobility: Supine to Sit     Supine to sit: Min assist     General bed mobility comments: assist upper body due to recent surgery and increased time   Transfers Overall transfer level: Needs assistance Equipment used: Rolling walker (2 wheeled) Transfers: Sit to/from Stand Sit to Stand: Min assist         General transfer comment: increased time and 25% VC's on safety with turns and hand placement with stand to sit  Ambulation/Gait Ambulation/Gait assistance: Min assist Gait Distance (Feet): 55 Feet Assistive device: Rolling walker (2 wheeled) Gait Pattern/deviations: Step-to pattern;Step-through pattern;Shuffle;Trunk flexed Gait velocity: decreased    General Gait Details: mild unsteady gait but tolerated an increased distance.  Pt's goal is to return to prior level of mobility with no  AD   Stairs             Wheelchair Mobility    Modified Rankin (Stroke Patients Only)       Balance                                            Cognition Arousal/Alertness: Awake/alert Behavior During Therapy: WFL for tasks assessed/performed Overall Cognitive Status: Within Functional Limits for tasks assessed                                 General Comments: very pleasant and motivated       Exercises      General Comments        Pertinent Vitals/Pain Pain Assessment: Faces Faces Pain Scale: Hurts little more Pain Location: L UE Pain Descriptors / Indicators: Discomfort;Sore Pain Intervention(s): Monitored during session;Repositioned    Home Living                      Prior Function            PT Goals (current goals can now be found in the care plan section) Progress towards PT goals: Progressing toward goals    Frequency    Min 2X/week      PT Plan Current plan remains appropriate    Co-evaluation              AM-PAC PT "6 Clicks" Mobility   Outcome  Measure  Help needed turning from your back to your side while in a flat bed without using bedrails?: A Lot Help needed moving from lying on your back to sitting on the side of a flat bed without using bedrails?: A Lot Help needed moving to and from a bed to a chair (including a wheelchair)?: A Lot Help needed standing up from a chair using your arms (e.g., wheelchair or bedside chair)?: A Lot Help needed to walk in hospital room?: A Lot Help needed climbing 3-5 steps with a railing? : A Lot 6 Click Score: 12    End of Session Equipment Utilized During Treatment: Gait belt Activity Tolerance: Patient tolerated treatment well Patient left: with call bell/phone within reach;with family/visitor present;in chair;with chair alarm set Nurse Communication: Mobility status       Time: 1240-1305 PT Time Calculation (min) (ACUTE ONLY): 25  min  Charges:  $Gait Training: 8-22 mins $Therapeutic Activity: 8-22 mins                     Rica Koyanagi  PTA Acute  Rehabilitation Services Pager      [phone] Office      [phone]

## 2017-12-20 NOTE — Care Management Important Message (Signed)
Important Message  Patient Details  Name: Lealon Vanputten MRN: [id number] Date of Birth: 1930-11-11   Medicare Important Message Given:  Yes    Kerin Salen 12/20/2017, 1:24 [place] Message  Patient Details  Name: Jebidiah Baggerly MRN: [id number] Date of Birth: Jul 29, 1930   Medicare Important Message Given:  Yes    Kerin Salen 12/20/2017, 1:24 PM

## 2017-12-21 ENCOUNTER — Inpatient Hospital Stay (HOSPITAL_COMMUNITY): Payer: Medicare HMO

## 2017-12-21 DIAGNOSIS — E440 Moderate protein-calorie malnutrition: Secondary | ICD-10-CM

## 2017-12-21 LAB — CBC
HCT: 26.8 % — ABNORMAL LOW (ref 39.0–52.0)
HEMOGLOBIN: 7.8 g/dL — AB (ref 13.0–17.0)
MCH: 25.5 pg — ABNORMAL LOW (ref 26.0–34.0)
MCHC: 29.1 g/dL — AB (ref 30.0–36.0)
MCV: 87.6 fL (ref 80.0–100.0)
Platelets: 286 10*3/uL (ref 150–400)
RBC: 3.06 MIL/uL — AB (ref 4.22–5.81)
RDW: 15.9 % — ABNORMAL HIGH (ref 11.5–15.5)
WBC: 8.3 10*3/uL (ref 4.0–10.5)
nRBC: 0 % (ref 0.0–0.2)

## 2017-12-21 LAB — COMPREHENSIVE METABOLIC PANEL
ALK PHOS: 58 U/L (ref 38–126)
ALT: 14 U/L (ref 0–44)
ANION GAP: 5 (ref 5–15)
AST: 15 U/L (ref 15–41)
Albumin: 1.8 g/dL — ABNORMAL LOW (ref 3.5–5.0)
BUN: 16 mg/dL (ref 8–23)
CALCIUM: 8.1 mg/dL — AB (ref 8.9–10.3)
CHLORIDE: 107 mmol/L (ref 98–111)
CO2: 29 mmol/L (ref 22–32)
Creatinine, Ser: 0.6 mg/dL — ABNORMAL LOW (ref 0.61–1.24)
GFR calc Af Amer: >60 mL/min (ref >60)
GFR calc non Af Amer: >60 mL/min (ref >60)
GLUCOSE: 142 mg/dL — AB (ref 70–99)
Potassium: 4.1 mmol/L (ref 3.5–5.1)
SODIUM: 141 mmol/L (ref 135–145)
Total Bilirubin: 0.6 mg/dL (ref 0.3–1.2)
Total Protein: 5.1 g/dL — ABNORMAL LOW (ref 6.5–8.1)

## 2017-12-21 LAB — C-REACTIVE PROTEIN: CRP: 14 mg/dL — ABNORMAL HIGH (ref <1.0)

## 2017-12-21 LAB — GLUCOSE, CAPILLARY
GLUCOSE-CAPILLARY: 137 mg/dL — AB (ref 70–99)
GLUCOSE-CAPILLARY: 126 mg/dL — AB (ref 70–99)
Glucose-Capillary: 201 mg/dL — ABNORMAL HIGH (ref 70–99)
Glucose-Capillary: 109 mg/dL — ABNORMAL HIGH (ref 70–99)

## 2017-12-21 LAB — MAGNESIUM: Magnesium: 1.7 mg/dL (ref 1.7–2.4)

## 2017-12-21 LAB — SEDIMENTATION RATE: SED RATE: 84 mm/h — AB (ref 0–16)

## 2017-12-21 MED ORDER — GADOBUTROL 1 MMOL/ML IV SOLN
7.0000 mL | Freq: Once | INTRAVENOUS | Status: AC | PRN
  Administered 2017-12-21: 7 mL via INTRAVENOUS

## 2017-12-21 NOTE — Progress Notes (Signed)
Clinical Social Worker following patient for support and discharge needs. CSW received phone call from [facility] and they stated patient was denied SNF stay from [facility]. Per Ronney Lion MD will have to do a per to per to try and override [employer]'s decision. CSW gave peer to peer information to MD and is awaiting the results of the peer to peer.   [employer] information: Dr.Giese is the MD to do peer to peer has to be done by 4:30 Number is [phone] Reference number: [id number]    Rhea Pink, MSW,  [place]

## 2017-12-21 NOTE — Plan of Care (Signed)
  Problem: Health Behavior/Discharge Planning: Goal: Ability to manage health-related needs will improve Outcome: Progressing   Problem: Clinical Measurements: Goal: Ability to maintain clinical measurements within normal limits will improve Outcome: Progressing Goal: Will remain free from infection Outcome: Progressing Goal: Diagnostic test results will improve Outcome: Progressing Goal: Cardiovascular complication will be avoided Outcome: Progressing

## 2017-12-21 NOTE — Progress Notes (Signed)
PROGRESS NOTE    Michael [name]  YWV:[id number] DOB: 1930/09/07 DOA: 12/14/2017 PCP: Jani Gravel, MD   Brief Narrative:  Michael [name] is a 84 y.o. male with medical history significant of CLL currently on treatment with ibrutinib, recent admission to the hospital for incarcerated left inguinal hernia from 11/10/2017 to 85/46/2703 with complications including re-incarceration with postoperative ileus and kidney failure,.  History of pancytopenia secondary to chemotherapy, history of skin ulcers who came to [facility] for follow-up from the nursing facility today.  While there he was found to have systolic blood pressure in the 60s.  No fever or chills no obvious evidence of sepsis.  Patient was sent to the ER for further evaluation.  He has been given multiple doses of IV fluids but still remains mildly hypotensive.  Systolic has finally reached 106 at the moment.  Patient noted to have hyponatremia with hypokalemia.  His creatinine is still elevated.  He reported poor oral intake since going to the facility a week ago.  He feels weak and tired as well as debilitated.  Patient is being admitted for evaluation of these hypotension.  Assessment & Plan:   Principal Problem:   Hypotension Active Problems:   CLL (chronic lymphocytic leukemia) (HCC)   Essential hypertension   Pressure ulcer   Hyponatremia   ARF (acute renal failure) (HCC)   Malnutrition of moderate degree   #1 Hypotension: Patient's hypotension likely due to poor oral intake and dehydration.  He responded to IVF.  Question of BP medication for pt, but I only see tamsulosin on his home meds.  Oxycodone could also contribute to hypotension.  His BP's continue to be improved.  Stable.  # Decubitus Ulcer: appreciate wound care recs - nutrition consult.  Today wound is foul smelling, doesn't look as good as it has.  Plain films unremarkable.  Follow MRI pelvis to r/o osteo.  Follow ESR, CRP.  # Poor Oral Intake: this seems to  be improving. - started on remeron, now 15 mg, continue to monitor.  # Possible Urinary Tract Infection: follow final culture results (growing multiple species).  UA with + LE, negative nitrite, few bacteria, but >50 wbc's.  Will d/c abx.  # LUE superficial thrombophlebitis: pt with no DVT, acute superficial vein thrombosis of L cephalic vein.  Continue to monitor.  Ice prn.  #2 acute kidney injury: improved with IV fluids  #3 CLL: Continue treatment per oncology.  Currently on ibrutinib, followed by Dr. Alvy Bimler.  On hold at this time.  Will need to follow up with dr. Alvy Bimler about timing of resumption of ibrutinib.  #4 hyponatremia: resolved  # Anemia: relatively stable, follow  # T2DM: continue 70/30 and SSI (watch closely for hypoglycemia with NPH and poor PO intake - decreased to 10 units BID, doing well with this dose at this time)  # L inguinal hernia repair: missed his follow up with surgery, this needs to be rescheduled   [employer] initially denied pt SNF stay, but discussed with Dr. Harrel Carina for peer to peer who approved SNF stay.  Approval #[id number].  DVT prophylaxis: heparin Code Status: full code Family Communication: daughter at bedside Disposition Plan: pending placement   Consultants:   none  Procedures:   none  Antimicrobials:  Anti-infectives (From admission, onward)   Start     Dose/Rate Route Frequency Ordered Stop   12/15/17 2100  cefTRIAXone (ROCEPHIN) 1 g in sodium chloride 0.9 % 100 mL IVPB  Status:  Discontinued  1 g 200 mL/hr over 30 Minutes Intravenous Every 24 hours 12/15/17 1800 12/16/17 1314   12/14/17 1815  cefTRIAXone (ROCEPHIN) 1 g in sodium chloride 0.9 % 100 mL IVPB     1 g 200 mL/hr over 30 Minutes Intravenous  Once 12/14/17 1814 12/14/17 2156     Subjective: Feeling ok. [place] food at bedside. Looking forward to eating. Daughter at bedside.  Objective: Vitals:   12/20/17 1350 12/20/17 2030 12/21/17 0506 12/21/17 1455  BP:  (!) 108/58 (!) 108/57 (!) 112/52 (!) 119/57  Pulse: 98 96 86 88  Resp: '14 18 18 20  '$ Temp: 98.7 F (37.1 C) 98.9 F (37.2 C) 98.9 F (37.2 C) 98.2 F (36.8 C)  TempSrc: Oral Oral Oral Oral  SpO2: 99% 98% 97% 100%  Weight:      Height:        Intake/Output Summary (Last 24 hours) at 12/21/2017 1836 Last data filed at 12/21/2017 1620 Gross per 24 hour  Intake 680 ml  Output 500 ml  Net 180 ml   Filed Weights   12/14/17 1641 12/15/17 1814  Weight: 70.3 kg 71.7 kg    Examination:  General: No acute distress. Cardiovascular: Heart sounds show a regular rate, and rhythm Lungs: Clear to auscultation bilaterally Abdomen: Soft, nontender, nondistended  Neurological: Alert and oriented 3. Moves all extremities 4. Cranial nerves II through XII grossly intact. Skin: unstageable decubitus ulcer, foul smelling today, which is new.  DTI to right heel.  Extremities: No clubbing or cyanosis. No edema. Psychiatric: Mood and affect are normal. Insight and judgment are appropraite.  Data Reviewed: I have personally reviewed following labs and imaging studies  CBC: Recent Labs  Lab 12/17/17 0514 12/18/17 0453 12/19/17 0525 12/20/17 0531 12/21/17 0539  WBC 9.3 9.0 8.5 8.2 8.3  HGB 8.6* 8.2* 8.4* 8.4* 7.8*  HCT 29.1* 27.6* 28.4* 28.2* 26.8*  MCV 87.1 89.6 87.1 86.5 87.6  PLT 315 314 363 365 737   Basic Metabolic Panel: Recent Labs  Lab 12/17/17 0514 12/18/17 0453 12/19/17 0525 12/20/17 0531 12/21/17 0539  NA 137 139 142 140 141  K 4.6 4.4 4.4 4.4 4.1  CL 104 106 107 106 107  CO2 '26 25 29 29 29  '$ GLUCOSE 146* 120* 94 119* 142*  BUN '13 13 16 15 16  '$ CREATININE 0.79 0.61 0.71 0.66 0.60*  CALCIUM 8.2* 8.1* 8.3* 8.3* 8.1*  MG 1.7 1.7 1.6* 1.8 1.7   GFR: Estimated Creatinine Clearance: 63.1 mL/min (A) (by C-G formula based on SCr of 0.6 mg/dL (L)). Liver Function Tests: Recent Labs  Lab 12/15/17 0400 12/17/17 0514 12/21/17 0539  AST '16 17 15  '$ ALT '15 14 14  '$ ALKPHOS  58 57 58  BILITOT 0.4 0.4 0.6  PROT 5.2* 5.3* 5.1*  ALBUMIN 1.9* 1.9* 1.8*   No results for input(s): LIPASE, AMYLASE in the last 168 hours. No results for input(s): AMMONIA in the last 168 hours. Coagulation Profile: No results for input(s): INR, PROTIME in the last 168 hours. Cardiac Enzymes: No results for input(s): CKTOTAL, CKMB, CKMBINDEX, TROPONINI in the last 168 hours. BNP (last 3 results) No results for input(s): PROBNP in the last 8760 hours. HbA1C: No results for input(s): HGBA1C in the last 72 hours. CBG: Recent Labs  Lab 12/20/17 1727 12/20/17 2028 12/21/17 0804 12/21/17 1238 12/21/17 1639  GLUCAP 157* 159* 137* 201* 126*   Lipid Profile: No results for input(s): CHOL, HDL, LDLCALC, TRIG, CHOLHDL, LDLDIRECT in the last 72 hours. Thyroid Function  Tests: No results for input(s): TSH, T4TOTAL, FREET4, T3FREE, THYROIDAB in the last 72 hours. Anemia Panel: No results for input(s): VITAMINB12, FOLATE, FERRITIN, TIBC, IRON, RETICCTPCT in the last 72 hours. Sepsis Labs: No results for input(s): PROCALCITON, LATICACIDVEN in the last 168 hours.  Recent Results (from the past 240 hour(s))  Urine culture     Status: Abnormal   Collection Time: 12/14/17  5:25 PM  Result Value Ref Range Status   Specimen Description   Final    URINE, RANDOM Performed at [facility] [address], [place], [place] [address]    Special Requests   Final    NONE Performed at [facility], [place] [address] [place],  [address]    Culture MULTIPLE SPECIES PRESENT, SUGGEST RECOLLECTION (A)  Final   Report Status 12/16/2017 FINAL  Final         Radiology Studies: Dg Pelvis 1-2 Views  Result Date: 12/21/2017 CLINICAL DATA:  History of CLL, on treatment currently, recent incarcerated left inguinal hernia EXAM: PELVIS - 1-2 VIEW COMPARISON:  CT abdomen pelvis of 11/15/2016 FINDINGS: Both hips are normal position with very little degenerative  joint disease for age. The pelvic rami are intact. There is some degenerative change of the SI joints bilaterally. No lytic or blastic bony lesion is seen. Degenerative changes noted involving the facet joints of L5-S1 and possibly L4-5. IMPRESSION: 1. No acute fracture. 2. Degenerative change of the SI joints and of the facet joints of L4-5 and L5-S1 Electronically Signed   By: Ivar Drape M.D.   On: 12/21/2017 17:14        Scheduled Meds: . aspirin EC  81 mg Oral Daily  . cholecalciferol  1,000 Units Oral BID  . collagenase   Topical Daily  . feeding supplement (ENSURE ENLIVE)  237 mL Oral TID BM  . gabapentin  100 mg Oral TID  . heparin  5,000 Units Subcutaneous Q8H  . insulin aspart  0-9 Units Subcutaneous TID WC  . insulin aspart protamine- aspart  10 Units Subcutaneous BID WC  . mirtazapine  15 mg Oral QHS  . multivitamin with minerals  1 tablet Oral Daily  . pantoprazole  40 mg Oral Daily  . polyethylene glycol  17 g Oral Daily  . tamsulosin  0.4 mg Oral Daily  . vitamin B-12  1,000 mcg Oral Daily   Continuous Infusions:    LOS: 7 days    Time spent: over 30 min    Fayrene Helper, MD [facility] Pager [phone]  If 7PM-7AM, please contact night-coverage [email or web address] Password [facility] 12/21/2017, 6:36 PM

## 2017-12-21 NOTE — Progress Notes (Addendum)
Nutrition Follow-up  DOCUMENTATION CODES:   Non-severe (moderate) malnutrition in context of chronic illness  INTERVENTION:  - Continue Ensure Enlive TID and Magic Cup BID. - Continue to encourage PO intakes.   NUTRITION DIAGNOSIS:   Moderate Malnutrition related to chronic illness as evidenced by mild fat depletion, moderate fat depletion, mild muscle depletion, moderate muscle depletion. -ongoing  GOAL:   Patient will meet greater than or equal to 90% of their needs -minimally met on average.  MONITOR:   PO intake, Supplement acceptance, Weight trends, Labs  ASSESSMENT:   84 y.o. male with medical history significant of CLL currently on treatment with ibrutinib, recent admission to the hospital (10/16-11/10) for incarcerated L inguinal hernia with complications including re-incarceration with post-op ileus and kidney failure. He presented to [facility] for follow-up and was found to have systolic blood pressure in the 60s and was sent to the ED for further evaluation. He feels weak and tired as well as debilitated.  No new weight since 11/20. Per chart review, patient consumed 100% of breakfast and 50% of dinner on 11/22, 90% of breakfast on 11/23, and 25% of breakfast and 50% of dinner on 11/24. Patient reports that he ate well for breakfast but is unable to recall what he ate. Patient did not eat lunch but reported drinking a full bottle of Ensure around lunch time.   Encouraged PO intakes; intakes have improved since admission and from intakes at facility PTA. Patient denies any abdominal pain or nausea today or since admission.   NFPE completed this visit and showed mild and moderate muscle depletions and mild and moderate fat depletions.    Medications reviewed; 1000 units vitamin D BID, sliding scale Novolog, 10 units Novolog BID, daily multivitamin with minerals, 1 packet Miralax/day, 1000 mcg oral cyanocobalamin/day, 15 mg Remeron/day. Labs reviewed; CBGs: 137 and 201 mg/dL  today, creatinine: 0.6 mg/dL, Ca: 8.1 mg/dL.      Diet Order:   Diet Order            Diet Carb Modified Fluid consistency: Thin; Room service appropriate? Yes  Diet effective now              EDUCATION NEEDS:   Education needs have been addressed  Skin:  Skin Assessment: Skin Integrity Issues: Skin Integrity Issues:: Unstageable DTI: L buttocks Unstageable: full thickness to L heel  Last BM:  11/25  Height:   Ht Readings from Last 1 Encounters:  12/15/17 '5\' 7"'$  (1.702 m)    Weight:   Wt Readings from Last 1 Encounters:  12/15/17 71.7 kg    Ideal Body Weight:  67.27 kg  BMI:  Body mass index is 24.75 kg/m.  Estimated Nutritional Needs:   Kcal:  2000-2200 kcal  Protein:  100-110 grams  Fluid:  >/= 2 L/day     Jarome Matin, MS, RD, LDN, [facility] Inpatient Clinical Dietitian Pager # [phone] After hours/weekend pager # [phone]

## 2017-12-22 DIAGNOSIS — Z79899 Other long term (current) drug therapy: Secondary | ICD-10-CM | POA: Diagnosis not present

## 2017-12-22 DIAGNOSIS — R609 Edema, unspecified: Secondary | ICD-10-CM | POA: Diagnosis not present

## 2017-12-22 DIAGNOSIS — R41841 Cognitive communication deficit: Secondary | ICD-10-CM | POA: Diagnosis not present

## 2017-12-22 DIAGNOSIS — I959 Hypotension, unspecified: Principal | ICD-10-CM

## 2017-12-22 DIAGNOSIS — L89324 Pressure ulcer of left buttock, stage 4: Secondary | ICD-10-CM | POA: Diagnosis not present

## 2017-12-22 DIAGNOSIS — L89329 Pressure ulcer of left buttock, unspecified stage: Secondary | ICD-10-CM | POA: Diagnosis not present

## 2017-12-22 DIAGNOSIS — K219 Gastro-esophageal reflux disease without esophagitis: Secondary | ICD-10-CM | POA: Diagnosis not present

## 2017-12-22 DIAGNOSIS — E119 Type 2 diabetes mellitus without complications: Secondary | ICD-10-CM | POA: Diagnosis not present

## 2017-12-22 DIAGNOSIS — R278 Other lack of coordination: Secondary | ICD-10-CM | POA: Diagnosis not present

## 2017-12-22 DIAGNOSIS — Z7401 Bed confinement status: Secondary | ICD-10-CM | POA: Diagnosis not present

## 2017-12-22 DIAGNOSIS — N3000 Acute cystitis without hematuria: Secondary | ICD-10-CM

## 2017-12-22 DIAGNOSIS — R531 Weakness: Secondary | ICD-10-CM | POA: Diagnosis not present

## 2017-12-22 DIAGNOSIS — Z7982 Long term (current) use of aspirin: Secondary | ICD-10-CM | POA: Diagnosis not present

## 2017-12-22 DIAGNOSIS — M6281 Muscle weakness (generalized): Secondary | ICD-10-CM | POA: Diagnosis not present

## 2017-12-22 DIAGNOSIS — L22 Diaper dermatitis: Secondary | ICD-10-CM | POA: Diagnosis not present

## 2017-12-22 DIAGNOSIS — E871 Hypo-osmolality and hyponatremia: Secondary | ICD-10-CM | POA: Diagnosis not present

## 2017-12-22 DIAGNOSIS — N179 Acute kidney failure, unspecified: Secondary | ICD-10-CM | POA: Diagnosis not present

## 2017-12-22 DIAGNOSIS — E08311 Diabetes mellitus due to underlying condition with unspecified diabetic retinopathy with macular edema: Secondary | ICD-10-CM | POA: Diagnosis not present

## 2017-12-22 DIAGNOSIS — D649 Anemia, unspecified: Secondary | ICD-10-CM | POA: Diagnosis not present

## 2017-12-22 DIAGNOSIS — K567 Ileus, unspecified: Secondary | ICD-10-CM | POA: Diagnosis not present

## 2017-12-22 DIAGNOSIS — D630 Anemia in neoplastic disease: Secondary | ICD-10-CM | POA: Diagnosis not present

## 2017-12-22 DIAGNOSIS — Z9221 Personal history of antineoplastic chemotherapy: Secondary | ICD-10-CM | POA: Diagnosis not present

## 2017-12-22 DIAGNOSIS — E440 Moderate protein-calorie malnutrition: Secondary | ICD-10-CM

## 2017-12-22 DIAGNOSIS — C9110 Chronic lymphocytic leukemia of B-cell type not having achieved remission: Secondary | ICD-10-CM | POA: Diagnosis not present

## 2017-12-22 DIAGNOSIS — I9589 Other hypotension: Secondary | ICD-10-CM | POA: Diagnosis not present

## 2017-12-22 DIAGNOSIS — M792 Neuralgia and neuritis, unspecified: Secondary | ICD-10-CM | POA: Diagnosis not present

## 2017-12-22 DIAGNOSIS — Z794 Long term (current) use of insulin: Secondary | ICD-10-CM | POA: Diagnosis not present

## 2017-12-22 DIAGNOSIS — R634 Abnormal weight loss: Secondary | ICD-10-CM | POA: Diagnosis not present

## 2017-12-22 DIAGNOSIS — R2681 Unsteadiness on feet: Secondary | ICD-10-CM | POA: Diagnosis not present

## 2017-12-22 DIAGNOSIS — M2550 Pain in unspecified joint: Secondary | ICD-10-CM | POA: Diagnosis not present

## 2017-12-22 LAB — COMPREHENSIVE METABOLIC PANEL
ALBUMIN: 1.9 g/dL — AB (ref 3.5–5.0)
ALK PHOS: 67 U/L (ref 38–126)
ALT: 16 U/L (ref 0–44)
AST: 17 U/L (ref 15–41)
Anion gap: 7 (ref 5–15)
BUN: 15 mg/dL (ref 8–23)
CALCIUM: 8.1 mg/dL — AB (ref 8.9–10.3)
CHLORIDE: 105 mmol/L (ref 98–111)
CO2: 28 mmol/L (ref 22–32)
CREATININE: 0.67 mg/dL (ref 0.61–1.24)
GFR calc non Af Amer: >60 mL/min (ref >60)
GLUCOSE: 146 mg/dL — AB (ref 70–99)
Potassium: 4.4 mmol/L (ref 3.5–5.1)
SODIUM: 140 mmol/L (ref 135–145)
Total Bilirubin: 0.4 mg/dL (ref 0.3–1.2)
Total Protein: 5.5 g/dL — ABNORMAL LOW (ref 6.5–8.1)

## 2017-12-22 LAB — CBC
HCT: 27.6 % — ABNORMAL LOW (ref 39.0–52.0)
Hemoglobin: 8.2 g/dL — ABNORMAL LOW (ref 13.0–17.0)
MCH: 25.8 pg — AB (ref 26.0–34.0)
MCHC: 29.7 g/dL — ABNORMAL LOW (ref 30.0–36.0)
MCV: 86.8 fL (ref 80.0–100.0)
NRBC: 0 % (ref 0.0–0.2)
PLATELETS: 269 10*3/uL (ref 150–400)
RBC: 3.18 MIL/uL — AB (ref 4.22–5.81)
RDW: 15.8 % — ABNORMAL HIGH (ref 11.5–15.5)
WBC: 8.8 10*3/uL (ref 4.0–10.5)

## 2017-12-22 LAB — GLUCOSE, CAPILLARY
GLUCOSE-CAPILLARY: 147 mg/dL — AB (ref 70–99)
GLUCOSE-CAPILLARY: 148 mg/dL — AB (ref 70–99)
Glucose-Capillary: 239 mg/dL — ABNORMAL HIGH (ref 70–99)

## 2017-12-22 LAB — MAGNESIUM: Magnesium: 1.8 mg/dL (ref 1.7–2.4)

## 2017-12-22 MED ORDER — OXYCODONE HCL 5 MG PO TABS: 5.0000 mg | ORAL_TABLET | Freq: Four times a day (QID) | ORAL | 0 refills | Status: DC | PRN

## 2017-12-22 MED ORDER — ACETAMINOPHEN ER 650 MG PO TBCR: 650.0000 mg | EXTENDED_RELEASE_TABLET | Freq: Two times a day (BID) | ORAL | 0 refills | Status: DC | PRN

## 2017-12-22 MED ORDER — POLYETHYLENE GLYCOL 3350 17 G PO PACK: 17.0000 g | PACK | Freq: Every day | ORAL | 0 refills | Status: DC

## 2017-12-22 MED ORDER — ADULT MULTIVITAMIN W/MINERALS CH: 1.0000 | ORAL_TABLET | Freq: Every day | ORAL | Status: DC

## 2017-12-22 MED ORDER — GLUCERNA 1.0 CAL PO LIQD: 30.0000 | Freq: Two times a day (BID) | ORAL | Status: DC

## 2017-12-22 MED ORDER — ENSURE ENLIVE PO LIQD: 237.0000 mL | Freq: Three times a day (TID) | ORAL | 12 refills | Status: DC

## 2017-12-22 MED ORDER — AMOXICILLIN-POT CLAVULANATE 875-125 MG PO TABS: 1.0000 | ORAL_TABLET | Freq: Two times a day (BID) | ORAL | 0 refills | Status: AC

## 2017-12-22 MED ORDER — MIRTAZAPINE 15 MG PO TABS: 15.0000 mg | ORAL_TABLET | Freq: Every day | ORAL | Status: DC

## 2017-12-22 NOTE — Progress Notes (Signed)
Clinical Social Worker facilitated patient discharge including contacting patient family and facility to confirm patient discharge plans.  Clinical information faxed to facility and family agreeable with plan.  CSW arranged ambulance transport via [facility].  RN to call [phone] (going to rm# [id number]) for report prior to discharge.  Clinical Social Worker will sign off for now as social work intervention is no longer needed. Please consult [place] again if new need arises.  Rhea Pink, MSW, [place]

## 2017-12-22 NOTE — Progress Notes (Signed)
Notified by CCMD that pt had a 9 beat run of V Tach. Pt asymptomatic. Currently sinus rhythm with a rate of 84. NP on call, [place], notified. Will continue to monitor.

## 2017-12-22 NOTE — Progress Notes (Signed)
Clinical Social Worker following patient for support and discharge needs. Patients MD did a peer to peer and was able to get authorization Josem Kaufmann # [id number]) approved on behalf of patient. Patient at this time is not medically stable for discharge per MD due to new infection. CSW made [facility] aware and they stated if patient does not come by midnight on Friday 12/24/17 patient will need a new authorization through [place].   Rhea Pink, MSW,  [place]

## 2017-12-22 NOTE — Consult Note (Addendum)
Reason for Consult: Foul-smelling decubitus Referring Physician: Dr. Dillard Cannon  Michael [name] is an 84 y.o. male.  HPI: Patient is an 84 year old gentleman transferred from the cancer center with hypotension and a creatinine of 1.47. Patient has a history of  Recurrent left inguinal hernia.  He underwent left inguinal hernia repair 11/10/2017 by Dr. Ninfa Linden followed by exploration left groin, removal of mesh, small bowel resection and left hernia repair with mesh on 11/15/17 by Dr. Coralie Keens.  Prolonged postoperative course and ileus with early decubitus formation noted on 11/26/2017.  As of his prolonged ileus he was placed on TNA.  He was ultimately discharged to a skilled nursing facility on 12/05/2017.  He returned for evaluation in the cancer center on 12/14/2017.  He reported poor  oral intake, weight loss feeling dizzy and lightheaded.  Blood sugars also been poorly controlled in the 2 50-60 range.  Skilled nursing facility reported blood pressure in the 84/47 range.  On arrival to the [facility] was 60/30 with a heart rate of 82.  He was afebrile given fluids and transferred to the emergency department. He was admitted by medicine on 12/14/2017, for his hypotension and significant CLL, and treatment with ibrutinib.  He was admitted by medicine for hypotension, acute kidney injury, CLL, hyponatremia, and possible UTI.  He was seen by wound care on 12/16/2017 for a left buttocks/sacral wound that was unstageable and a right lateral heel pressure sore.  Left medial buttocks with going to the sacrum was measured 8 cm x 6 cm with an unknown depth.  He was placed on local wound care along with nutrition consult for wound healing.  He was also found to have a left upper extremity superficial thrombophlebitis.  Patient had an MRI of the sacrum with and without contrast on 12/21/2017.  Findings were consistent with a cellulitis of the left buttocks, myositis, no abscess or osteomyelitis or septic  joints.  We are asked to evaluate.   Past Medical History:  Diagnosis Date  . Anemia   . Anginal pain (HCC)    upon exertion  . Arthritis    "left shoulder, lower back" (10/20/2016)  . CLL (chronic lymphoblastic leukemia) dx'd 2000  . Edema leg   . Family history of adverse reaction to anesthesia    "daughter's BP drops"   . GERD (gastroesophageal reflux disease)   . History of hiatal hernia   . Leukemia, chronic lymphoid ([place]) 12/08/2010  . Lymphocytosis   . Type II diabetes mellitus ([place])     Past Surgical History:  Procedure Laterality Date  . BOWEL RESECTION N/A 11/15/2017   Procedure: SMALL BOWEL RESECTION;  Surgeon: Coralie Keens, MD;  Location: [facility];  Service: General;  Laterality: N/A;  . CATARACT EXTRACTION W/ INTRAOCULAR LENS  IMPLANT, BILATERAL Bilateral   . EYE SURGERY     Cataracts (bilateral)  . GROIN DISSECTION Left 11/15/2017   Procedure: GROIN EXPLORATION WITH REMOVAL OF INGUINAL HERNIA MESH;  Surgeon: Coralie Keens, MD;  Location: [facility];  Service: General;  Laterality: Left;  . INGUINAL HERNIA REPAIR Left 11/10/2017   Procedure: LEFT INGUINAL HERNIA REPAIR ERAS PATHWAY;  Surgeon: Coralie Keens, MD;  Location: [facility];  Service: General;  Laterality: Left;  . INGUINAL HERNIA REPAIR Left 11/15/2017   Procedure: LEFT INGUINAL HERNIA REPAIR WITH MESH;  Surgeon: Coralie Keens, MD;  Location: [facility];  Service: General;  Laterality: Left;  . INSERTION OF MESH Left 11/10/2017   Procedure: INSERTION OF MESH;  Surgeon: Ninfa Linden,  Nathaneil Canary, MD;  Location: [facility];  Service: General;  Laterality: Left;    Family History  Problem Relation Age of Onset  . Cancer Mother   . Hypertension Father   . Diabetes Father     Social History:  reports that he has never smoked. He quit smokeless tobacco use about 2 years ago.  His smokeless tobacco use included snuff. He reports that he drank alcohol. He reports that he does not use drugs.  Allergies: No Known  Allergies  Medications:  Prior to Admission:  Medications Prior to Admission  Medication Sig Dispense Refill Last Dose  . acetaminophen (TYLENOL) 650 MG CR tablet Take 1,300 mg by mouth 2 (two) times daily as needed for pain.   Past Week at Unknown time  . aspirin EC 81 MG tablet Take 81 mg by mouth daily.   12/14/2017 at Unknown time  . Cholecalciferol (VITAMIN D3) 1000 units CAPS Take 1,000 Units by mouth 2 (two) times daily.    12/14/2017 at Unknown time  . collagenase (SANTYL) ointment Apply 1 application topically daily.   12/14/2017 at Unknown time  . Cyanocobalamin (VITAMIN B 12 PO) Take 1,000 mcg by mouth daily.   12/14/2017 at Unknown time  . docusate sodium (COLACE) 100 MG capsule Take 100 mg by mouth 2 (two) times daily as needed for mild constipation.   prn  . gabapentin (NEURONTIN) 100 MG capsule Take 100 mg by mouth 3 (three) times daily.   12/14/2017 at Unknown time  . insulin NPH-regular Human (70-30) 100 UNIT/ML injection Inject 15 Units into the skin 2 (two) times daily with a meal.   12/14/2017 at Unknown time  . Polyethyl Glycol-Propyl Glycol (SYSTANE OP) Place 1 drop into both eyes 2 (two) times daily.   12/14/2017 at Unknown time  . tamsulosin (FLOMAX) 0.4 MG CAPS capsule Take 0.4 mg by mouth daily.   12/13/2017 at Unknown time  . Capsaicin (ASPERCREME PAIN RELIEF PATCH EX) Apply 1 patch topically daily. Apply 1 patch to left shoulder 12 hrs/day. On at 8 am, off at 8 [name]    at prn  . IMBRUVICA 140 MG capsul TAKE 2 CAPSULES (280 MG TOTAL) BY MOUTH DAILY. (Patient not taking: No sig reported) 60 capsule 6 Not Taking at Unknown time  . oxyCODONE (OXY IR/ROXICODONE) 5 MG immediate release tablet Take 1 tablet (5 mg total) by mouth every 6 (six) hours as needed for moderate pain, severe pain or breakthrough pain. (Patient not taking: Reported on 12/14/2017) 20 tablet 0 Not Taking at Unknown time  . pantoprazole (PROTONIX) 40 MG tablet Take 1 tablet (40 mg total) by mouth daily.  (Patient not taking: Reported on 12/14/2017) 30 tablet 11 Not Taking at Unknown time   Scheduled: . aspirin EC  81 mg Oral Daily  . cholecalciferol  1,000 Units Oral BID  . collagenase   Topical Daily  . feeding supplement (ENSURE ENLIVE)  237 mL Oral TID BM  . gabapentin  100 mg Oral TID  . heparin  5,000 Units Subcutaneous Q8H  . insulin aspart  0-9 Units Subcutaneous TID WC  . insulin aspart protamine- aspart  10 Units Subcutaneous BID WC  . mirtazapine  15 mg Oral QHS  . multivitamin with minerals  1 tablet Oral Daily  . pantoprazole  40 mg Oral Daily  . polyethylene glycol  17 g Oral Daily  . tamsulosin  0.4 mg Oral Daily  . vitamin B-12  1,000 mcg Oral Daily   Continuous:  Anti-infectives (From admission, onward)   Start     Dose/Rate Route Frequency Ordered Stop   12/15/17 2100  cefTRIAXone (ROCEPHIN) 1 g in sodium chloride 0.9 % 100 mL IVPB  Status:  Discontinued     1 g 200 mL/hr over 30 Minutes Intravenous Every 24 hours 12/15/17 1800 12/16/17 1314   12/14/17 1815  cefTRIAXone (ROCEPHIN) 1 g in sodium chloride 0.9 % 100 mL IVPB     1 g 200 mL/hr over 30 Minutes Intravenous  Once 12/14/17 1814 12/14/17 2156      Results for orders placed or performed during the hospital encounter of 12/14/17 (from the past 48 hour(s))  Glucose, capillary     Status: Abnormal   Collection Time: 12/20/17  5:27 [name]  Result Value Ref Range   Glucose-Capillary 157 (H) 70 - 99 mg/dL  Glucose, capillary     Status: Abnormal   Collection Time: 12/20/17  8:28 [name]  Result Value Ref Range   Glucose-Capillary 159 (H) 70 - 99 mg/dL  CBC     Status: Abnormal   Collection Time: 12/21/17  5:39 AM  Result Value Ref Range   WBC 8.3 4.0 - 10.5 K/uL   RBC 3.06 (L) 4.22 - 5.81 MIL/uL   Hemoglobin 7.8 (L) 13.0 - 17.0 g/dL   HCT 26.8 (L) 39.0 - 52.0 %   MCV 87.6 80.0 - 100.0 fL   MCH 25.5 (L) 26.0 - 34.0 pg   MCHC 29.1 (L) 30.0 - 36.0 g/dL   RDW 15.9 (H) 11.5 - 15.5 %   Platelets 286 150 - 400 K/uL    nRBC 0.0 0.0 - 0.2 %    Comment: Performed at [facility], [place] [address], [place], [place] [address]  Comprehensive metabolic panel     Status: Abnormal   Collection Time: 12/21/17  5:39 AM  Result Value Ref Range   Sodium 141 135 - 145 mmol/L   Potassium 4.1 3.5 - 5.1 mmol/L   Chloride 107 98 - 111 mmol/L   CO2 29 22 - 32 mmol/L   Glucose, Bld 142 (H) 70 - 99 mg/dL   BUN 16 8 - 23 mg/dL   Creatinine, Ser 0.60 (L) 0.61 - 1.24 mg/dL   Calcium 8.1 (L) 8.9 - 10.3 mg/dL   Total Protein 5.1 (L) 6.5 - 8.1 g/dL   Albumin 1.8 (L) 3.5 - 5.0 g/dL   AST 15 15 - 41 U/L   ALT 14 0 - 44 U/L   Alkaline Phosphatase 58 38 - 126 U/L   Total Bilirubin 0.6 0.3 - 1.2 mg/dL   GFR calc non Af Amer >60 >60 mL/min   GFR calc Af Amer >60 >60 mL/min    Comment: (NOTE) The eGFR has been calculated using the CKD EPI equation. This calculation has not been validated in all clinical situations. eGFR's persistently <60 mL/min signify possible Chronic Kidney Disease.    Anion gap 5 5 - 15    Comment: Performed at [facility], [place] [address], [place], [place] [address]  Magnesium     Status: None   Collection Time: 12/21/17  5:39 AM  Result Value Ref Range   Magnesium 1.7 1.7 - 2.4 mg/dL    Comment: Performed at [facility], [place] [address] [place],  [address]  Glucose, capillary     Status: Abnormal   Collection Time: 12/21/17  8:04 AM  Result Value Ref Range   Glucose-Capillary 137 (H) 70 - 99  mg/dL  Glucose, capillary     Status: Abnormal   Collection Time: 12/21/17 12:38 [name]  Result Value Ref Range   Glucose-Capillary 201 (H) 70 - 99 mg/dL  Glucose, capillary     Status: Abnormal   Collection Time: 12/21/17  4:39 [name]  Result Value Ref Range   Glucose-Capillary 126 (H) 70 - 99 mg/dL  C-reactive protein     Status: Abnormal   Collection Time: 12/21/17  4:40 [name]  Result Value Ref Range   CRP 14.0 (H) <1.0 mg/dL    Comment: Performed  at [facility], [place] [address], [place], [place] [address]  Sedimentation rate     Status: Abnormal   Collection Time: 12/21/17  4:40 [name]  Result Value Ref Range   Sed Rate 84 (H) 0 - 16 mm/hr    Comment: Performed at [facility], [place] [address] [place], [place] [address]  Glucose, capillary     Status: Abnormal   Collection Time: 12/21/17 11:29 [name]  Result Value Ref Range   Glucose-Capillary 109 (H) 70 - 99 mg/dL  CBC     Status: Abnormal   Collection Time: 12/22/17  5:41 AM  Result Value Ref Range   WBC 8.8 4.0 - 10.5 K/uL   RBC 3.18 (L) 4.22 - 5.81 MIL/uL   Hemoglobin 8.2 (L) 13.0 - 17.0 g/dL   HCT 27.6 (L) 39.0 - 52.0 %   MCV 86.8 80.0 - 100.0 fL   MCH 25.8 (L) 26.0 - 34.0 pg   MCHC 29.7 (L) 30.0 - 36.0 g/dL   RDW 15.8 (H) 11.5 - 15.5 %   Platelets 269 150 - 400 K/uL   nRBC 0.0 0.0 - 0.2 %    Comment: Performed at [facility], [place] [address] [place], [place] [address]  Comprehensive metabolic panel     Status: Abnormal   Collection Time: 12/22/17  5:41 AM  Result Value Ref Range   Sodium 140 135 - 145 mmol/L   Potassium 4.4 3.5 - 5.1 mmol/L   Chloride 105 98 - 111 mmol/L   CO2 28 22 - 32 mmol/L   Glucose, Bld 146 (H) 70 - 99 mg/dL   BUN 15 8 - 23 mg/dL   Creatinine, Ser 0.67 0.61 - 1.24 mg/dL   Calcium 8.1 (L) 8.9 - 10.3 mg/dL   Total Protein 5.5 (L) 6.5 - 8.1 g/dL   Albumin 1.9 (L) 3.5 - 5.0 g/dL   AST 17 15 - 41 U/L   ALT 16 0 - 44 U/L   Alkaline Phosphatase 67 38 - 126 U/L   Total Bilirubin 0.4 0.3 - 1.2 mg/dL   GFR calc non Af Amer >60 >60 mL/min   GFR calc Af Amer >60 >60 mL/min   Anion gap 7 5 - 15    Comment: Performed at [facility], [place] [address], [place], [place] [address]  Magnesium     Status: None   Collection Time: 12/22/17  5:41 AM  Result Value Ref Range   Magnesium 1.8 1.7 - 2.4 mg/dL    Comment: Performed at [facility], [place] [address]  [place], [place] [address]  Glucose, capillary     Status: Abnormal   Collection Time: 12/22/17  7:55 AM  Result Value Ref Range   Glucose-Capillary 147 (H) 70 - 99 mg/dL  Glucose, capillary     Status: Abnormal   Collection Time: 12/22/17 12:06 [name]  Result Value Ref Range  Glucose-Capillary 239 (H) 70 - 99 mg/dL    Dg Pelvis 1-2 Views  Result Date: 12/21/2017 CLINICAL DATA:  History of CLL, on treatment currently, recent incarcerated left inguinal hernia EXAM: PELVIS - 1-2 VIEW COMPARISON:  CT abdomen pelvis of 11/15/2016 FINDINGS: Both hips are normal position with very little degenerative joint disease for age. The pelvic rami are intact. There is some degenerative change of the SI joints bilaterally. No lytic or blastic bony lesion is seen. Degenerative changes noted involving the facet joints of L5-S1 and possibly L4-5. IMPRESSION: 1. No acute fracture. 2. Degenerative change of the SI joints and of the facet joints of L4-5 and L5-S1 Electronically Signed   By: Ivar Drape M.D.   On: 12/21/2017 17:14   Mr Pelvis W Wo Contrast  Result Date: [name] CLINICAL DATA:  Sacral decubitus ulcer in a patient with diabetes and chronic lymphocytic leukemia. EXAM: MRI OF THE SACRUM WITHOUT AND WITH CONTRAST TECHNIQUE: Multiplanar, multisequence MR imaging of the sacrum was performed both before and after administration of intravenous contrast. CONTRAST:  7 cc Gadavist IV. COMPARISON:  Single-view of the pelvis 12/23/2017. CT abdomen and pelvis 11/15/2017. FINDINGS: Bones/Joint/Cartilage There is no bone marrow edema or enhancement to suggest osteomyelitis. No acute bony or joint abnormality is identified. Tiny synovial herniation pit at the head neck junction of the left hip is noted. There is no joint effusion. Ligaments Negative. Muscles and Tendons A small area of edema and enhancement in the medial aspect of the left gluteus maximus are consistent with myositis. No intramuscular abscess is identified. No  muscle or tendon tear. Soft tissues Subcutaneous edema and enhancement are seen in the left buttock consistent with cellulitis. No abscess is identified. Large stool ball in the rectum is noted. Left inguinal hernia seen on the prior CT scan has been repaired. IMPRESSION: Findings consistent with cellulitis of the left buttock and myositis in the underlying medial aspect of the left gluteus maximus. Negative for abscess, osteomyelitis or septic joint. Status post repair of a left inguinal hernia. Electronically Signed   By: Inge Rise M.D.   On: [name] 08:52   Rocephin x2 days 11/19 -12/16/2017 Review of Systems  All other systems reviewed and are negative.  Blood pressure 125/61, pulse 83, temperature 99.2 F (37.3 C), temperature source Oral, resp. rate 20, height '5\' 7"'$  (1.702 m), weight 71.7 kg, SpO2 97 %. Physical Exam  Constitutional: He is oriented to person, place, and time. He appears well-developed and well-nourished.  debilitated gentleman in bed, painful to move around.  HENT:  Head: Normocephalic and atraumatic.  Mouth/Throat: Oropharynx is clear and moist.  Eyes: Right eye exhibits no discharge. Left eye exhibits no discharge. No scleral icterus.  Pupils are equal  Neck: Normal range of motion. Neck supple. No JVD present. No tracheal deviation present. No thyromegaly present.  Cardiovascular: Normal rate, regular rhythm and normal heart sounds.  Respiratory: Effort normal and breath sounds normal. No respiratory distress. He has no wheezes.  GI: Soft. Bowel sounds are normal. He exhibits no distension and no mass. There is no rebound.  Musculoskeletal: He exhibits no edema or tenderness.  Lymphadenopathy:    He has no cervical adenopathy.  Neurological: He is alert and oriented to person, place, and time. No cranial nerve deficit.  Skin: Skin is warm and dry. No rash noted. No erythema. No pallor.  Wound as pictured below, clean base around it with midportion showing  some skin necrosis.  Site measures about 5  x 5 cm.  No drainage or fluctuance . I did not not any odor related to the decubitus.  Psychiatric: He has a normal mood and affect. His behavior is normal. Judgment and thought content normal.    ~5 x 5 cm, the mid portion is actually raised, in this picture it looks depressed but is not.  Assessment/Plan: Left sacral decubitus CLL on chemotherapy on ibrutinib Recent LIH repair, SB resection AKI  Possible UTI Poor PO intake Anemia   Plan:  Keep this area clean, dry dressing, keep him off of it and walk as much as possible.  He apparently has very limited physical activity.  I would not debride this, at this time.    I would work to keep it clean and let it heal in from the base. Hopefully this will slough off in time.    JENNINGS,Michael [name], Michael [name]   Agree with above. Daughter in room when we made rounds and she looked at the wound with [place].  Alphonsa Overall, MD, [facility] Surgery Pager: [phone] Office phone:  [phone]

## 2017-12-22 NOTE — Progress Notes (Signed)
Report called to [facility] Stacey Drain

## 2017-12-22 NOTE — Discharge Summary (Signed)
Discharge Summary  Michael [name] ACZ:[id number] DOB: [birth year]  PCP: Jani Gravel, MD  Admit date: 12/14/2017 Discharge date: 12/22/2017  Time spent: 65mins, more than 50% time spent on coordination of care.  Recommendations for Outpatient Follow-up:  1. F/u with SNF PCP  for hospital discharge follow up, repeat cbc/bmp at follow up 2. Wound care continue to follow for left buttock wound 3. F/u with oncology Dr Alvy Bimler on 12/12  Discharge Diagnoses:  Active Hospital Problems   Diagnosis Date Noted  . Hypotension 12/14/2017  . Malnutrition of moderate degree 12/21/2017  . Hyponatremia 12/14/2017  . ARF (acute renal failure) ([place]) 12/14/2017  . Pressure ulcer 11/22/2017  . Essential hypertension 10/20/2016  . CLL (chronic lymphocytic leukemia) ([place]) 12/08/2010    Resolved Hospital Problems  No resolved problems to display.    Discharge Condition: stable  Diet recommendation: heart healthy/carb modified  Filed Weights   12/14/17 1641 12/15/17 1814  Weight: 70.3 kg 71.7 kg    History of present illness: (per admitting MD Dr Jonelle Sidle) PCP: Jani Gravel, MD   Outpatient Specialists: [facility], cancer center  Patient coming from: Skilled nursing facility via cancer center  Chief Complaint: Low blood pressure  HPI: Michael [name] is a 84 y.o. male with medical history significant of CLL currently on treatment with ibrutinib, recent admission to the hospital for incarcerated left inguinal hernia from 11/10/2017 to 10/93/2355 with complications including re-incarceration with postoperative ileus and kidney failure,.  History of pancytopenia secondary to chemotherapy, history of skin ulcers who came to [facility] for follow-up from the nursing facility today.  While there he was found to have systolic blood pressure in the 60s.  No fever or chills no obvious evidence of sepsis.  Patient was sent to the ER for further evaluation.  He has been given multiple doses of IV  fluids but still remains mildly hypotensive.  Systolic has finally reached 106 at the moment.  Patient noted to have hyponatremia with hypokalemia.  His creatinine is still elevated.  He reported poor oral intake since going to the facility a week ago.  He feels weak and tired as well as debilitated.  Patient is being admitted for evaluation of these hypotension.  ED Course: Temperature was 97 5 initial blood pressure 60/30 currently 106/44 pulse is 56 respiratory rate of 22 oxygen sat 90%.  White count is 9.5 hemoglobin 9.1 and platelet 265.  Sodium 133 potassium 4.8 chloride 97 CO2 25 BUN 37 creatinine 1.47 and calcium 8.4 glucose is 134.  Urinalysis is essentially normal and EKG showed no significant findings.  Patient received boluses of normal saline and Ringer's lactate and he is being admitted for further evaluation.  Hospital Course:  Principal Problem:   Hypotension Active Problems:   CLL (chronic lymphocytic leukemia) ([facility])   Essential hypertension   Pressure ulcer   Hyponatremia   ARF (acute renal failure) ([facility])   Malnutrition of moderate degree   Hypotension: Patient's hypotension likely due to poor oral intake and dehydration. He responded to IVF. Question of BP medication for pt, but I only see tamsulosin on his home meds.  Oxycodone could also contribute to hypotension.  His BP's continue to be improved.  Stable.  Left buttock Decubitus Ulcer/celllulitis/myositis:  -MRI pelvis negative for osteo, +celllulitis/myositis,  -patient does not have fever, no leukocytosis -general surgery consulted for left buttock wound, no need debridement, continue wound care/nutrition/pressrue off loading -augmentin prescribed at discharge    Poor Oral Intake: this seems to be improving. -  started on remeron, now 15 mg, continue to monitor.  hyponatremia: resolved  Acute kidney injury on CKDII: Resolved with IV fluids Cr back to baseline  Possible Urinary Tract Infection: follow  final culture results (growing multiple species).  UA with + LE, negative nitrite, few bacteria, but >50 wbc's.  he received rocephin  In the hospital  LUE superficial thrombophlebitis: pt with no DVT, acute superficial vein thrombosis of L cephalic vein.  Continue to monitor.  Ice prn.   WEX:HBZJIRCV treatment per oncology.  Currently on ibrutinib, followed by Dr. Alvy Bimler.  On hold at this time.  Will need to follow up with Dr. Alvy Bimler about timing of resumption of ibrutinib.   Anemia of chronic disease: relatively stable Follow up with Dr Alvy Bimler   T2DM: continue 70/30 and SSI (watch closely for hypoglycemia with NPH and poor PO intake - decreased to 10 units BID in the hospital, doing well with this dose at this time Oral intake improved, discharge on home dose insulin snf MD to continue monitor blood glucose control  s/p L inguinal hernia repair: missed his follow up with surgery, this needs to be rescheduled   [employer] initially denied pt SNF stay, but discussed with Dr. Harrel Carina for peer to peer who approved SNF stay.  Approval #[id number].  DVT prophylaxis while in the hospital: heparin Code Status: full code Family Communication: daughter at bedside Disposition Plan: return to SNF with wound care and nutrition consult   Consultants:   General surgery for left buttock wound  Procedures:   none  Antimicrobials:             Anti-infectives (From admission, onward)   Start     Dose/Rate Route Frequency Ordered Stop   12/15/17 2100  cefTRIAXone (ROCEPHIN) 1 g in sodium chloride 0.9 % 100 mL IVPB  Status:  Discontinued     1 g 200 mL/hr over 30 Minutes Intravenous Every 24 hours 12/15/17 1800 12/16/17 1314   12/14/17 1815  cefTRIAXone (ROCEPHIN) 1 g in sodium chloride 0.9 % 100 mL IVPB     1 g 200 mL/hr over 30 Minutes Intravenous  Once 12/14/17 1814 12/14/17 2156       Discharge Exam: BP 125/61 (BP Location: Right Arm)   Pulse 83   Temp 99.2 F  (37.3 C) (Oral)   Resp 20   Ht 5\' 7"  (1.702 m)   Wt 71.7 kg   SpO2 97%   BMI 24.75 kg/m   General: NAD, pleasant Cardiovascular: RRR Respiratory: CTABL Left buttock wound     Discharge Instructions You were cared for by a hospitalist during your hospital stay. If you have any questions about your discharge medications or the care you received while you were in the hospital after you are discharged, you can call the unit and asked to speak with the hospitalist on call if the hospitalist that took care of you is not available. Once you are discharged, your primary care physician will handle any further medical issues. Please note that NO REFILLS for any discharge medications will be authorized once you are discharged, as it is imperative that you return to your primary care physician (or establish a relationship with a primary care physician if you do not have one) for your aftercare needs so that they can reassess your need for medications and monitor your lab values.  Discharge Instructions    Diet - low sodium heart healthy   Complete by:  As directed    Carb modified diet  Increase activity slowly   Complete by:  As directed      Allergies as of 12/22/2017   No Known Allergies     Medication List    STOP taking these medications   docusate sodium 100 MG capsule Commonly known as:  COLACE   IMBRUVICA 140 MG capsul Generic drug:  ibrutinib   pantoprazole 40 MG tablet Commonly known as:  PROTONIX     TAKE these medications   acetaminophen 650 MG CR tablet Commonly known as:  TYLENOL Take 1 tablet (650 mg total) by mouth 2 (two) times daily as needed for pain. What changed:  how much to take   amoxicillin-clavulanate 875-125 MG tablet Commonly known as:  AUGMENTIN Take 1 tablet by mouth 2 (two) times daily for 10 days.   ASPERCREME PAIN RELIEF PATCH EX Apply 1 patch topically daily. Apply 1 patch to left shoulder 12 hrs/day. On at 8 am, off at 8 pm   aspirin  EC 81 MG tablet Take 81 mg by mouth daily.   collagenase ointment Commonly known as:  SANTYL Apply 1 application topically daily.   gabapentin 100 MG capsule Commonly known as:  NEURONTIN Take 100 mg by mouth 3 (three) times daily.   GLUCERNA 1.0 CAL Liqd Take 30 Cans by mouth 2 (two) times daily.   insulin NPH-regular Human (70-30) 100 UNIT/ML injection Inject 15 Units into the skin 2 (two) times daily with a meal.   mirtazapine 15 MG tablet Commonly known as:  REMERON Take 1 tablet (15 mg total) by mouth at bedtime.   multivitamin with minerals Tabs tablet Take 1 tablet by mouth daily. Start taking on:  12/23/2017   oxyCODONE 5 MG immediate release tablet Commonly known as:  Oxy IR/ROXICODONE Take 1 tablet (5 mg total) by mouth every 6 (six) hours as needed for moderate pain, severe pain or breakthrough pain.   polyethylene glycol packet Commonly known as:  MIRALAX / GLYCOLAX Take 17 g by mouth daily. Start taking on:  12/23/2017   SYSTANE OP Place 1 drop into both eyes 2 (two) times daily.   tamsulosin 0.4 MG Caps capsule Commonly known as:  FLOMAX Take 0.4 mg by mouth daily.   VITAMIN B 12 PO Take 1,000 mcg by mouth daily.   Vitamin D3 25 MCG (1000 UT) Caps Take 1,000 Units by mouth 2 (two) times daily.      No Known Allergies Follow-up Information    Jani Gravel, MD Follow up.   Specialty:  Internal Medicine Contact information: [place] [address] [phone]        Coralie Keens, MD Follow up in 2 week(s).   Specialty:  General Surgery Why:  s/p left inguinal hernia repair follow up Contact information: [address] [place] [address] [phone]            The results of significant diagnostics from this hospitalization (including imaging, microbiology, ancillary and laboratory) are listed below for reference.    Significant Diagnostic Studies: Dg Pelvis 1-2 Views  Result Date:  12/21/2017 CLINICAL DATA:  History of CLL, on treatment currently, recent incarcerated left inguinal hernia EXAM: PELVIS - 1-2 VIEW COMPARISON:  CT abdomen pelvis of 11/15/2016 FINDINGS: Both hips are normal position with very little degenerative joint disease for age. The pelvic rami are intact. There is some degenerative change of the SI joints bilaterally. No lytic or blastic bony lesion is seen. Degenerative changes noted involving the facet joints  of L5-S1 and possibly L4-5. IMPRESSION: 1. No acute fracture. 2. Degenerative change of the SI joints and of the facet joints of L4-5 and L5-S1 Electronically Signed   By: Ivar Drape M.D.   On: 12/21/2017 17:14   Dg Abd 1 View  Result Date: 11/25/2017 CLINICAL DATA:  Follow-up ileus. EXAM: ABDOMEN - 1 VIEW COMPARISON:  November 23, 2017 FINDINGS: Air-filled dilated loops of bowel, primarily small bowel, are again identified. There is gas in the colon to the level of the splenic flexure. There is little gas distal to this level. No other acute abnormalities. IMPRESSION: Probable ileus, similar in the interval. Electronically Signed   By: Dorise Bullion III M.D   On: 11/25/2017 19:51   Mr Pelvis W Wo Contrast  Result Date: 12/22/2017 CLINICAL DATA:  Sacral decubitus ulcer in a patient with diabetes and chronic lymphocytic leukemia. EXAM: MRI OF THE SACRUM WITHOUT AND WITH CONTRAST TECHNIQUE: Multiplanar, multisequence MR imaging of the sacrum was performed both before and after administration of intravenous contrast. CONTRAST:  7 cc Gadavist IV. COMPARISON:  Single-view of the pelvis 12/23/2017. CT abdomen and pelvis 11/15/2017. FINDINGS: Bones/Joint/Cartilage There is no bone marrow edema or enhancement to suggest osteomyelitis. No acute bony or joint abnormality is identified. Tiny synovial herniation pit at the head neck junction of the left hip is noted. There is no joint effusion. Ligaments Negative. Muscles and Tendons A small area of edema and  enhancement in the medial aspect of the left gluteus maximus are consistent with myositis. No intramuscular abscess is identified. No muscle or tendon tear. Soft tissues Subcutaneous edema and enhancement are seen in the left buttock consistent with cellulitis. No abscess is identified. Large stool ball in the rectum is noted. Left inguinal hernia seen on the prior CT scan has been repaired. IMPRESSION: Findings consistent with cellulitis of the left buttock and myositis in the underlying medial aspect of the left gluteus maximus. Negative for abscess, osteomyelitis or septic joint. Status post repair of a left inguinal hernia. Electronically Signed   By: Inge Rise M.D.   On: 12/22/2017 08:52   Dg Abd Portable 1v  Result Date: 11/23/2017 CLINICAL DATA:  Nasogastric tube placement. EXAM: PORTABLE ABDOMEN - 1 VIEW COMPARISON:  Abdominal radiograph November 14, 2017 FINDINGS: Nasogastric tube looped in proximal stomach, side port projecting distal to GE junction. Multiple loops of gas distended small bowel measuring to at least 4 cm. Air within large bowel without dilatation. No intra-abdominal mass effect. Soft tissue planes and included osseous structures are unchanged. IMPRESSION: Nasogastric tube tip projects in proximal stomach. Mild ileus versus early small bowel obstruction. Electronically Signed   By: Elon Alas M.D.   On: 11/23/2017 21:42   Dg Abd Portable 1v  Result Date: 11/22/2017 CLINICAL DATA:  Nasogastric tube placement. EXAM: PORTABLE ABDOMEN - 1 VIEW COMPARISON:  Radiograph of same day. FINDINGS: Nasogastric tube tip is seen in expected position of distal stomach or proximal duodenum. Stable small bowel dilatation is noted concerning for obstruction. IMPRESSION: Nasogastric tube tip seen in expected position of distal stomach or proximal duodenum. Stable small bowel dilatation is noted concerning for obstruction. Electronically Signed   By: Marijo Conception, M.D.   On: 11/22/2017  19:25   Dg Abd Portable 2v  Result Date: 12/05/2017 CLINICAL DATA:  Abdominal bloating. EXAM: PORTABLE ABDOMEN - 2 VIEW COMPARISON:  11/25/2017 FINDINGS: There is generalized increased bowel gas, without bowel dilation to suggest obstruction. No air-fluid levels. No evidence of adynamic  ileus. No free air. No convincing renal or ureteral stones. Soft tissues are unremarkable. No acute or significant skeletal abnormality. IMPRESSION: 1. Generalized increased bowel gas without evidence of obstruction or significant adynamic ileus. No free air. Electronically Signed   By: Lajean Manes M.D.   On: 12/05/2017 12:38   Vas [place] Upper Extremity Venous Duplex  Result Date: 12/16/2017 UPPER VENOUS STUDY  Indications: Pain Performing Technologist: Toma Copier RVS  Examination Guidelines: A complete evaluation includes B-mode imaging, spectral Doppler, color Doppler, and power Doppler as needed of all accessible portions of each vessel. Bilateral testing is considered an integral part of a complete examination. Limited examinations for reoccurring indications may be performed as noted.  Right Findings: +----------+------------+----------+---------+-----------+---------------------+ RIGHT     CompressiblePropertiesPhasicitySpontaneous       Summary        +----------+------------+----------+---------+-----------+---------------------+ Subclavian                                          Unable to adequately                                                      image due to clavicle +----------+------------+----------+---------+-----------+---------------------+  Left Findings: +--------+------------+----------+---------+-----------+-----------------------+ LEFT    CompressiblePropertiesPhasicitySpontaneous        Summary         +--------+------------+----------+---------+-----------+-----------------------+ IJV         Full                 Yes       Yes                             +--------+------------+----------+---------+-----------+-----------------------+ Axillary    Full                 Yes       Yes                            +--------+------------+----------+---------+-----------+-----------------------+ Brachial    Full                 Yes       Yes                            +--------+------------+----------+---------+-----------+-----------------------+ Radial      Full                                                          +--------+------------+----------+---------+-----------+-----------------------+ Ulnar       Full                                                          +--------+------------+----------+---------+-----------+-----------------------+ Cephalic    None  Acute from [facility] to Mid                                                            Proximal         +--------+------------+----------+---------+-----------+-----------------------+ Basilic     Full                                                          +--------+------------+----------+---------+-----------+-----------------------+  Summary:  Left: No evidence of deep vein thrombosis in the upper extremity. No evidence of thrombosis in the subclavian. Findings consistent with acute superficial vein thrombosis involving the left cephalic vein.  *See table(s) above for measurements and observations.  Diagnosing physician: Monica Martinez MD Electronically signed by Monica Martinez MD on 12/16/2017 at 3:38:53 PM.    Final     Microbiology: Recent Results (from the past 240 hour(s))  Urine culture     Status: Abnormal   Collection Time: 12/14/17  5:25 PM  Result Value Ref Range Status   Specimen Description   Final    URINE, RANDOM Performed at [facility], [place] [address], [place], [place] [address]    Special Requests   Final    NONE Performed at [facility], [place] [address], [place], [place] [address]    Culture MULTIPLE SPECIES PRESENT, SUGGEST RECOLLECTION (A)  Final   Report Status 12/16/2017 FINAL  Final     Labs: Basic Metabolic Panel: Recent Labs  Lab 12/18/17 0453 12/19/17 0525 12/20/17 0531 12/21/17 0539 12/22/17 0541  NA 139 142 140 141 140  K 4.4 4.4 4.4 4.1 4.4  CL 106 107 106 107 105  CO2 25 29 29 29 28   GLUCOSE 120* 94 119* 142* 146*  BUN 13 16 15 16 15   CREATININE 0.61 0.71 0.66 0.60* 0.67  CALCIUM 8.1* 8.3* 8.3* 8.1* 8.1*  MG 1.7 1.6* 1.8 1.7 1.8   Liver Function Tests: Recent Labs  Lab 12/17/17 0514 12/21/17 0539 12/22/17 0541  AST 17 15 17   ALT 14 14 16   ALKPHOS 57 58 67  BILITOT 0.4 0.6 0.4  PROT 5.3* 5.1* 5.5*  ALBUMIN 1.9* 1.8* 1.9*   No results for input(s): LIPASE, AMYLASE in the last 168 hours. No results for input(s): AMMONIA in the last 168 hours. CBC: Recent Labs  Lab 12/18/17 0453 12/19/17 0525 12/20/17 0531 12/21/17 0539 12/22/17 0541  WBC 9.0 8.5 8.2 8.3 8.8  HGB 8.2* 8.4* 8.4* 7.8* 8.2*  HCT 27.6* 28.4* 28.2* 26.8* 27.6*  MCV 89.6 87.1 86.5 87.6 86.8  PLT 314 363 365 286 269   Cardiac Enzymes: No results for input(s): CKTOTAL, CKMB, CKMBINDEX, TROPONINI in the last 168 hours. BNP: BNP (last 3 results) No results for input(s): BNP in the last 8760 hours.  ProBNP (last 3 results) No results for input(s): PROBNP in the last 8760 hours.  CBG: Recent Labs  Lab 12/21/17 1238 12/21/17 1639 12/21/17 2329 12/22/17 0755 12/22/17 1206  GLUCAP 201* 126* 109* 147* 239*       Signed:  Florencia Reasons MD, PhD  [facility] 12/22/2017, 2:53 PM

## 2017-12-22 NOTE — Progress Notes (Signed)
Call placed to [facility] in MRI to have results uploaded into epic. MRI has not been read at this time, but Horris Latino will call to have radiologist read and enter results. Stacey Drain

## 2017-12-27 DIAGNOSIS — L89329 Pressure ulcer of left buttock, unspecified stage: Secondary | ICD-10-CM | POA: Diagnosis not present

## 2017-12-27 DIAGNOSIS — I959 Hypotension, unspecified: Secondary | ICD-10-CM | POA: Diagnosis not present

## 2017-12-27 DIAGNOSIS — E119 Type 2 diabetes mellitus without complications: Secondary | ICD-10-CM | POA: Diagnosis not present

## 2017-12-27 DIAGNOSIS — M792 Neuralgia and neuritis, unspecified: Secondary | ICD-10-CM | POA: Diagnosis not present

## 2017-12-28 DIAGNOSIS — K219 Gastro-esophageal reflux disease without esophagitis: Secondary | ICD-10-CM | POA: Diagnosis not present

## 2017-12-29 DIAGNOSIS — L89324 Pressure ulcer of left buttock, stage 4: Secondary | ICD-10-CM | POA: Diagnosis not present

## 2017-12-29 DIAGNOSIS — L22 Diaper dermatitis: Secondary | ICD-10-CM | POA: Diagnosis not present

## 2017-12-31 DIAGNOSIS — D649 Anemia, unspecified: Secondary | ICD-10-CM | POA: Diagnosis not present

## 2017-12-31 DIAGNOSIS — R609 Edema, unspecified: Secondary | ICD-10-CM | POA: Diagnosis not present

## 2017-12-31 DIAGNOSIS — L89329 Pressure ulcer of left buttock, unspecified stage: Secondary | ICD-10-CM | POA: Diagnosis not present

## 2018-01-05 ENCOUNTER — Other Ambulatory Visit: Payer: Self-pay

## 2018-01-05 DIAGNOSIS — C9110 Chronic lymphocytic leukemia of B-cell type not having achieved remission: Secondary | ICD-10-CM

## 2018-01-06 ENCOUNTER — Inpatient Hospital Stay: Payer: Medicare HMO

## 2018-01-06 ENCOUNTER — Encounter: Payer: Self-pay | Admitting: Hematology and Oncology

## 2018-01-06 ENCOUNTER — Telehealth: Payer: Self-pay | Admitting: Hematology and Oncology

## 2018-01-06 ENCOUNTER — Inpatient Hospital Stay: Payer: Medicare HMO | Attending: Hematology and Oncology | Admitting: Hematology and Oncology

## 2018-01-06 DIAGNOSIS — R634 Abnormal weight loss: Secondary | ICD-10-CM | POA: Insufficient documentation

## 2018-01-06 DIAGNOSIS — Z794 Long term (current) use of insulin: Secondary | ICD-10-CM | POA: Insufficient documentation

## 2018-01-06 DIAGNOSIS — Z79899 Other long term (current) drug therapy: Secondary | ICD-10-CM | POA: Diagnosis not present

## 2018-01-06 DIAGNOSIS — Z7982 Long term (current) use of aspirin: Secondary | ICD-10-CM | POA: Insufficient documentation

## 2018-01-06 DIAGNOSIS — Z9221 Personal history of antineoplastic chemotherapy: Secondary | ICD-10-CM | POA: Diagnosis not present

## 2018-01-06 DIAGNOSIS — R600 Localized edema: Secondary | ICD-10-CM

## 2018-01-06 DIAGNOSIS — D630 Anemia in neoplastic disease: Secondary | ICD-10-CM | POA: Diagnosis not present

## 2018-01-06 DIAGNOSIS — E119 Type 2 diabetes mellitus without complications: Secondary | ICD-10-CM | POA: Diagnosis not present

## 2018-01-06 DIAGNOSIS — C9110 Chronic lymphocytic leukemia of B-cell type not having achieved remission: Secondary | ICD-10-CM | POA: Diagnosis not present

## 2018-01-06 LAB — CBC WITH DIFFERENTIAL (CANCER CENTER ONLY)
Abs Immature Granulocytes: 0 10*3/uL (ref 0.00–0.07)
BASOS ABS: 0.1 10*3/uL (ref 0.0–0.1)
BASOS PCT: 1 %
EOS PCT: 2 %
Eosinophils Absolute: 0.2 10*3/uL (ref 0.0–0.5)
HCT: 31.4 % — ABNORMAL LOW (ref 39.0–52.0)
HEMOGLOBIN: 9.5 g/dL — AB (ref 13.0–17.0)
LYMPHS ABS: 3.6 10*3/uL (ref 0.7–4.0)
Lymphocytes Relative: 34 %
MCH: 26 pg (ref 26.0–34.0)
MCHC: 30.3 g/dL (ref 30.0–36.0)
MCV: 86 fL (ref 80.0–100.0)
Monocytes Absolute: 0.5 10*3/uL (ref 0.1–1.0)
Monocytes Relative: 5 %
NEUTROS ABS: 6.2 10*3/uL (ref 1.7–17.7)
NRBC: 0 % (ref 0.0–0.2)
Neutrophils Relative %: 58 %
PLATELETS: 326 10*3/uL (ref 150–400)
RBC: 3.65 MIL/uL — AB (ref 4.22–5.81)
RDW: 17.2 % — AB (ref 11.5–15.5)
WBC Count: 10.7 10*3/uL — ABNORMAL HIGH (ref 4.0–10.5)

## 2018-01-06 LAB — CMP (CANCER CENTER ONLY)
ALK PHOS: 77 U/L (ref 38–126)
ALT: <6 U/L (ref 0–44)
ANION GAP: 7 (ref 5–15)
AST: 10 U/L — ABNORMAL LOW (ref 15–41)
Albumin: 2.4 g/dL — ABNORMAL LOW (ref 3.5–5.0)
BUN: 14 mg/dL (ref 8–23)
CALCIUM: 8.5 mg/dL — AB (ref 8.9–10.3)
CO2: 27 mmol/L (ref 22–32)
Chloride: 107 mmol/L (ref 98–111)
Creatinine: 0.7 mg/dL (ref 0.61–1.24)
GFR, Est AFR Am: >60 mL/min (ref >60)
GFR, Estimated: >60 mL/min (ref >60)
GLUCOSE: 139 mg/dL — AB (ref 70–99)
Potassium: 4.2 mmol/L (ref 3.5–5.1)
Sodium: 141 mmol/L (ref 135–145)
Total Protein: 6 g/dL — ABNORMAL LOW (ref 6.5–8.1)

## 2018-01-06 LAB — LACTATE DEHYDROGENASE: LDH: 191 U/L (ref 98–192)

## 2018-01-06 NOTE — Assessment & Plan Note (Signed)
Unfortunately, he had significant recurrent hospitalization and side effects from recent surgery I recommend to hold ibrutinib until evaluation next month CBC show mildly elevated leukocytosis which could be related to decubitus ulcer but no evidence of significant lymphocytosis

## 2018-01-06 NOTE — Telephone Encounter (Signed)
LOS not showing up in system.  Per NG, scheduled lab/30 min dr appt in one month.  Gave patient avs and calendar.

## 2018-01-06 NOTE — Assessment & Plan Note (Signed)
He has significant recent weight loss due to recurrent hospitalization and surgery He will continue nutritional supplement as tolerated along with mirtazapine

## 2018-01-06 NOTE — Assessment & Plan Note (Signed)
He has multifactorial anemia, likely anemia chronic illness related to recent recurrent hospitalization He is not symptomatic He has improved since previous visit Observe only

## 2018-01-06 NOTE — Assessment & Plan Note (Signed)
He has stable chronic bilateral extremity edema, likely due to poor mobility and third spacing from protein calorie malnutrition Observe only for now

## 2018-01-06 NOTE — Progress Notes (Signed)
[facility] OFFICE PROGRESS NOTE  Patient Care Team: Jani Gravel, MD as PCP - General (Internal Medicine) Heath Lark, MD as Consulting Physician (Hematology and Oncology)  ASSESSMENT & PLAN:  CLL (chronic lymphocytic leukemia) ([place]) Unfortunately, he had significant recurrent hospitalization and side effects from recent surgery I recommend to hold ibrutinib until evaluation next month CBC show mildly elevated leukocytosis which could be related to decubitus ulcer but no evidence of significant lymphocytosis  Anemia in neoplastic disease He has multifactorial anemia, likely anemia chronic illness related to recent recurrent hospitalization He is not symptomatic He has improved since previous visit Observe only  Weight loss, non-intentional He has significant recent weight loss due to recurrent hospitalization and surgery He will continue nutritional supplement as tolerated along with mirtazapine  Bilateral leg edema He has stable chronic bilateral extremity edema, likely due to poor mobility and third spacing from protein calorie malnutrition Observe only for now   No orders of the defined types were placed in this encounter.   INTERVAL HISTORY: Please see below for problem oriented charting. He returns for further follow-up with family He had recurrent hospitalization due to complications related to surgery Currently, he is residing in skilled nursing facility with plan for discharge at the end of this week He is eating better and has started to gain some weight Ibrutinib is placed on hold No recent recurrent infection, fever or chills His decubitus ulcer is slowly healing He has stable chronic bilateral lower extremity edema  SUMMARY OF ONCOLOGIC HISTORY:   CLL (chronic lymphocytic leukemia) ([place])   04/03/2008 Pathology Results    Case #: [id number] FLow cytomtry confirmed CLL    10/11/2014 Tumor Marker    FISH analysis showed trisomy 12 abnormality     04/29/2015 -  Chemotherapy    He started taking Ibrutinib    04/29/2015 Imaging    Ct scan showed mild lymphadenopathy and splenomegaly    05/13/2015 Adverse Reaction    Due to neutropenia, dose of Ibrutinib is reduced to 280 mg daily    01/08/2016 Imaging    Response to therapy. Resolution of thoracic and abdominal adenopathy. Resolution of splenomegaly. 2. No new or progressive disease. 3. Small bowel containing left inguinal hernia and right-sided hydrocele, as before. 4. Esophageal air fluid level suggests dysmotility or gastroesophageal reflux. 5. Possible constipation. 6. Suspect hepatic hemangiomas, similar.    10/09/2016 Imaging    No findings suspicious for active lymphomatous involvement.  No abdominopelvic lymphadenopathy. Spleen is normal in size.  Moderate to large left inguinal/scrotal hernia containing small bowel and fat. No evidence of bowel obstruction.  Additional stable ancillary findings as above.    04/26/2017 Imaging    1. No lymphadenopathy in the chest, abdomen, or pelvis. 2. Mild circumferential bladder wall thickening. Incomplete distention may contribute to this appearance, but bladder infection is also consideration. 3. Stable appearance small hepatic lesions. Imaging features most suggestive of benign cavernous hemangiomas. 4. Stable tiny low-density renal lesions compatible with cysts. 5. Large left groin hernia contains numerous small bowel loops without complicating features. 6. Right-sided hydrocele.    11/15/2017 Imaging    Large recurrent left inguinal hernia with extension of multiple small bowel loops into the inguinal canal and scrotum on the left. Proximal small bowel dilatation is noted consistent with a degree of incarceration.  Bilateral small effusions as well as patchy bibasilar infiltrates.     12/14/2017 Adverse Reaction    Ibrutinib placed on hold     REVIEW OF  SYSTEMS:   Constitutional: Denies fevers, chills or abnormal weight  loss Eyes: Denies blurriness of vision Ears, nose, mouth, throat, and face: Denies mucositis or sore throat Respiratory: Denies cough, dyspnea or wheezes Cardiovascular: Denies palpitation, chest discomfort  Gastrointestinal:  Denies nausea, heartburn or change in bowel habits Lymphatics: Denies new lymphadenopathy or easy bruising Neurological:Denies numbness, tingling or new weaknesses Behavioral/Psych: Mood is stable, no new changes  All other systems were reviewed with the patient and are negative.  I have reviewed the past medical history, past surgical history, social history and family history with the patient and they are unchanged from previous note.  ALLERGIES:  has No Known Allergies.  MEDICATIONS:  Current Outpatient Medications  Medication Sig Dispense Refill  . acetaminophen (TYLENOL) 650 MG CR tablet Take 1 tablet (650 mg total) by mouth 2 (two) times daily as needed for pain. 30 tablet 0  . aspirin EC 81 MG tablet Take 81 mg by mouth daily.    . Capsaicin (ASPERCREME PAIN RELIEF PATCH EX) Apply 1 patch topically daily. Apply 1 patch to left shoulder 12 hrs/day. On at 8 am, off at 8 pm    . Cholecalciferol (VITAMIN D3) 1000 units CAPS Take 1,000 Units by mouth 2 (two) times daily.     . collagenase (SANTYL) ointment Apply 1 application topically daily.    . Cyanocobalamin (VITAMIN B 12 PO) Take 1,000 mcg by mouth daily.    Marland Kitchen gabapentin (NEURONTIN) 100 MG capsule Take 100 mg by mouth 3 (three) times daily.    . insulin NPH-regular Human (70-30) 100 UNIT/ML injection Inject 15 Units into the skin 2 (two) times daily with a meal.    . mirtazapine (REMERON) 15 MG tablet Take 1 tablet (15 mg total) by mouth at bedtime.    . Multiple Vitamin (MULTIVITAMIN WITH MINERALS) TABS tablet Take 1 tablet by mouth daily.    . Nutritional Supplements (GLUCERNA 1.0 CAL) LIQD Take 30 Cans by mouth 2 (two) times daily.    Marland Kitchen oxyCODONE (OXY IR/ROXICODONE) 5 MG immediate release tablet Take 1  tablet (5 mg total) by mouth every 6 (six) hours as needed for moderate pain, severe pain or breakthrough pain. 5 tablet 0  . Polyethyl Glycol-Propyl Glycol (SYSTANE OP) Place 1 drop into both eyes 2 (two) times daily.    . polyethylene glycol (MIRALAX / GLYCOLAX) packet Take 17 g by mouth daily. 14 each 0  . tamsulosin (FLOMAX) 0.4 MG CAPS capsule Take 0.4 mg by mouth daily.     No current facility-administered medications for this visit.     PHYSICAL EXAMINATION: ECOG PERFORMANCE STATUS: 2 - Symptomatic, <50% confined to bed  Vitals:   01/06/18 1252  BP: (!) 104/53  Pulse: 67  Resp: 18  Temp: 97.9 F (36.6 C)  SpO2: 100%   Filed Weights   01/06/18 1252  Weight: 172 lb 12.8 oz (78.4 kg)    GENERAL:alert, no distress and comfortable.  He is frail, sitting on the wheelchair SKIN: skin color, texture, turgor are normal, no rashes or significant lesions EYES: normal, Conjunctiva are pink and non-injected, sclera clear OROPHARYNX:no exudate, no erythema and lips, buccal mucosa, and tongue normal  NECK: supple, thyroid normal size, non-tender, without nodularity LYMPH:  no palpable lymphadenopathy in the cervical, axillary or inguinal LUNGS: clear to auscultation and percussion with normal breathing effort HEART: regular rate & rhythm and no murmurs with moderate bilateral lower extremity edema ABDOMEN:abdomen soft, non-tender and normal bowel sounds Musculoskeletal:no cyanosis of digits  and no clubbing  NEURO: alert & oriented x 3 with fluent speech, no focal motor/sensory deficits  LABORATORY DATA:  I have reviewed the data as listed    Component Value Date/Time   NA 140 12/22/2017 0541   NA 140 10/09/2016 1106   K 4.4 12/22/2017 0541   K 4.7 10/09/2016 1106   CL 105 12/22/2017 0541   CL 104 03/28/2012 1426   CO2 28 12/22/2017 0541   CO2 28 10/09/2016 1106   GLUCOSE 146 (H) 12/22/2017 0541   GLUCOSE 127 10/09/2016 1106   GLUCOSE 171 (H) 03/28/2012 1426   BUN 15  12/22/2017 0541   BUN 20.8 10/09/2016 1106   CREATININE 0.67 12/22/2017 0541   CREATININE 1.47 (H) 12/14/2017 1448   CREATININE 1.1 10/09/2016 1106   CALCIUM 8.1 (L) 12/22/2017 0541   CALCIUM 9.1 10/09/2016 1106   PROT 5.5 (L) 12/22/2017 0541   PROT 5.8 (L) 10/09/2016 1106   ALBUMIN 1.9 (L) 12/22/2017 0541   ALBUMIN 3.1 (L) 10/09/2016 1106   AST 17 12/22/2017 0541   AST 21 12/14/2017 1448   AST 17 10/09/2016 1106   ALT 16 12/22/2017 0541   ALT 14 12/14/2017 1448   ALT 8 10/09/2016 1106   ALKPHOS 67 12/22/2017 0541   ALKPHOS 46 10/09/2016 1106   BILITOT 0.4 12/22/2017 0541   BILITOT <0.2 (L) 12/14/2017 1448   BILITOT 0.23 10/09/2016 1106   GFRNONAA >60 12/22/2017 0541   GFRNONAA 42 (L) 12/14/2017 1448   GFRAA >60 12/22/2017 0541   GFRAA 48 (L) 12/14/2017 1448    No results found for: SPEP, UPEP  Lab Results  Component Value Date   WBC 10.7 (H) 01/06/2018   NEUTROABS 6.2 01/06/2018   HGB 9.5 (L) 01/06/2018   HCT 31.4 (L) 01/06/2018   MCV 86.0 01/06/2018   PLT 326 01/06/2018      Chemistry      Component Value Date/Time   NA 140 12/22/2017 0541   NA 140 10/09/2016 1106   K 4.4 12/22/2017 0541   K 4.7 10/09/2016 1106   CL 105 12/22/2017 0541   CL 104 03/28/2012 1426   CO2 28 12/22/2017 0541   CO2 28 10/09/2016 1106   BUN 15 12/22/2017 0541   BUN 20.8 10/09/2016 1106   CREATININE 0.67 12/22/2017 0541   CREATININE 1.47 (H) 12/14/2017 1448   CREATININE 1.1 10/09/2016 1106      Component Value Date/Time   CALCIUM 8.1 (L) 12/22/2017 0541   CALCIUM 9.1 10/09/2016 1106   ALKPHOS 67 12/22/2017 0541   ALKPHOS 46 10/09/2016 1106   AST 17 12/22/2017 0541   AST 21 12/14/2017 1448   AST 17 10/09/2016 1106   ALT 16 12/22/2017 0541   ALT 14 12/14/2017 1448   ALT 8 10/09/2016 1106   BILITOT 0.4 12/22/2017 0541   BILITOT <0.2 (L) 12/14/2017 1448   BILITOT 0.23 10/09/2016 1106       RADIOGRAPHIC STUDIES: I have personally reviewed the radiological images as  listed and agreed with the findings in the report. Dg Pelvis 1-2 Views  Result Date: 12/21/2017 CLINICAL DATA:  History of CLL, on treatment currently, recent incarcerated left inguinal hernia EXAM: PELVIS - 1-2 VIEW COMPARISON:  CT abdomen pelvis of 11/15/2016 FINDINGS: Both hips are normal position with very little degenerative joint disease for age. The pelvic rami are intact. There is some degenerative change of the SI joints bilaterally. No lytic or blastic bony lesion is seen. Degenerative changes noted involving the  facet joints of L5-S1 and possibly L4-5. IMPRESSION: 1. No acute fracture. 2. Degenerative change of the SI joints and of the facet joints of L4-5 and L5-S1 Electronically Signed   By: Ivar Drape M.D.   On: 12/21/2017 17:14   Mr Pelvis W Wo Contrast  Result Date: 12/22/2017 CLINICAL DATA:  Sacral decubitus ulcer in a patient with diabetes and chronic lymphocytic leukemia. EXAM: MRI OF THE SACRUM WITHOUT AND WITH CONTRAST TECHNIQUE: Multiplanar, multisequence MR imaging of the sacrum was performed both before and after administration of intravenous contrast. CONTRAST:  7 cc Gadavist IV. COMPARISON:  Single-view of the pelvis 12/23/2017. CT abdomen and pelvis 11/15/2017. FINDINGS: Bones/Joint/Cartilage There is no bone marrow edema or enhancement to suggest osteomyelitis. No acute bony or joint abnormality is identified. Tiny synovial herniation pit at the head neck junction of the left hip is noted. There is no joint effusion. Ligaments Negative. Muscles and Tendons A small area of edema and enhancement in the medial aspect of the left gluteus maximus are consistent with myositis. No intramuscular abscess is identified. No muscle or tendon tear. Soft tissues Subcutaneous edema and enhancement are seen in the left buttock consistent with cellulitis. No abscess is identified. Large stool ball in the rectum is noted. Left inguinal hernia seen on the prior CT scan has been repaired.  IMPRESSION: Findings consistent with cellulitis of the left buttock and myositis in the underlying medial aspect of the left gluteus maximus. Negative for abscess, osteomyelitis or septic joint. Status post repair of a left inguinal hernia. Electronically Signed   By: Inge Rise M.D.   On: 12/22/2017 08:52   Vas [place] Upper Extremity Venous Duplex  Result Date: 12/16/2017 UPPER VENOUS STUDY  Indications: Pain Performing Technologist: Toma Copier RVS  Examination Guidelines: A complete evaluation includes B-mode imaging, spectral Doppler, color Doppler, and power Doppler as needed of all accessible portions of each vessel. Bilateral testing is considered an integral part of a complete examination. Limited examinations for reoccurring indications may be performed as noted.  Right Findings: +----------+------------+----------+---------+-----------+---------------------+ RIGHT     CompressiblePropertiesPhasicitySpontaneous       Summary        +----------+------------+----------+---------+-----------+---------------------+ Subclavian                                          Unable to adequately                                                      image due to clavicle +----------+------------+----------+---------+-----------+---------------------+  Left Findings: +--------+------------+----------+---------+-----------+-----------------------+ LEFT    CompressiblePropertiesPhasicitySpontaneous        Summary         +--------+------------+----------+---------+-----------+-----------------------+ IJV         Full                 Yes       Yes                            +--------+------------+----------+---------+-----------+-----------------------+ Axillary    Full                 Yes       Yes                            +--------+------------+----------+---------+-----------+-----------------------+  Brachial    Full                 Yes       Yes                             +--------+------------+----------+---------+-----------+-----------------------+ Radial      Full                                                          +--------+------------+----------+---------+-----------+-----------------------+ Ulnar       Full                                                          +--------+------------+----------+---------+-----------+-----------------------+ Cephalic    None                                   Acute from [facility] to Mid                                                            Proximal         +--------+------------+----------+---------+-----------+-----------------------+ Basilic     Full                                                          +--------+------------+----------+---------+-----------+-----------------------+  Summary:  Left: No evidence of deep vein thrombosis in the upper extremity. No evidence of thrombosis in the subclavian. Findings consistent with acute superficial vein thrombosis involving the left cephalic vein.  *See table(s) above for measurements and observations.  Diagnosing physician: Monica Martinez MD Electronically signed by Monica Martinez MD on 12/16/2017 at 3:38:53 PM.    Final     All questions were answered. The patient knows to call the clinic with any problems, questions or concerns. No barriers to learning was detected.  I spent 15 minutes counseling the patient face to face. The total time spent in the appointment was 20 minutes and more than 50% was on counseling and review of test results  Heath Lark, MD 01/06/2018 1:19 PM

## 2018-01-12 DIAGNOSIS — L89324 Pressure ulcer of left buttock, stage 4: Secondary | ICD-10-CM | POA: Diagnosis not present

## 2018-01-12 DIAGNOSIS — E860 Dehydration: Secondary | ICD-10-CM | POA: Diagnosis not present

## 2018-01-12 DIAGNOSIS — I950 Idiopathic hypotension: Secondary | ICD-10-CM | POA: Diagnosis not present

## 2018-01-12 DIAGNOSIS — Z4889 Encounter for other specified surgical aftercare: Secondary | ICD-10-CM | POA: Diagnosis not present

## 2018-01-12 DIAGNOSIS — E46 Unspecified protein-calorie malnutrition: Secondary | ICD-10-CM | POA: Diagnosis not present

## 2018-01-23 DIAGNOSIS — L89324 Pressure ulcer of left buttock, stage 4: Secondary | ICD-10-CM | POA: Diagnosis not present

## 2018-01-23 DIAGNOSIS — E440 Moderate protein-calorie malnutrition: Secondary | ICD-10-CM | POA: Diagnosis not present

## 2018-01-23 DIAGNOSIS — M48061 Spinal stenosis, lumbar region without neurogenic claudication: Secondary | ICD-10-CM | POA: Diagnosis not present

## 2018-01-23 DIAGNOSIS — L03317 Cellulitis of buttock: Secondary | ICD-10-CM | POA: Diagnosis not present

## 2018-01-23 DIAGNOSIS — I129 Hypertensive chronic kidney disease with stage 1 through stage 4 chronic kidney disease, or unspecified chronic kidney disease: Secondary | ICD-10-CM | POA: Diagnosis not present

## 2018-01-23 DIAGNOSIS — E1122 Type 2 diabetes mellitus with diabetic chronic kidney disease: Secondary | ICD-10-CM | POA: Diagnosis not present

## 2018-01-23 DIAGNOSIS — N182 Chronic kidney disease, stage 2 (mild): Secondary | ICD-10-CM | POA: Diagnosis not present

## 2018-01-23 DIAGNOSIS — D631 Anemia in chronic kidney disease: Secondary | ICD-10-CM | POA: Diagnosis not present

## 2018-01-23 DIAGNOSIS — C9190 Lymphoid leukemia, unspecified not having achieved remission: Secondary | ICD-10-CM | POA: Diagnosis not present

## 2018-01-23 DIAGNOSIS — I950 Idiopathic hypotension: Secondary | ICD-10-CM | POA: Diagnosis not present

## 2018-01-24 DIAGNOSIS — E1122 Type 2 diabetes mellitus with diabetic chronic kidney disease: Secondary | ICD-10-CM | POA: Diagnosis not present

## 2018-01-24 DIAGNOSIS — M48061 Spinal stenosis, lumbar region without neurogenic claudication: Secondary | ICD-10-CM | POA: Diagnosis not present

## 2018-01-24 DIAGNOSIS — L03317 Cellulitis of buttock: Secondary | ICD-10-CM | POA: Diagnosis not present

## 2018-01-24 DIAGNOSIS — I129 Hypertensive chronic kidney disease with stage 1 through stage 4 chronic kidney disease, or unspecified chronic kidney disease: Secondary | ICD-10-CM | POA: Diagnosis not present

## 2018-01-24 DIAGNOSIS — N182 Chronic kidney disease, stage 2 (mild): Secondary | ICD-10-CM | POA: Diagnosis not present

## 2018-01-24 DIAGNOSIS — D631 Anemia in chronic kidney disease: Secondary | ICD-10-CM | POA: Diagnosis not present

## 2018-01-24 DIAGNOSIS — I950 Idiopathic hypotension: Secondary | ICD-10-CM | POA: Diagnosis not present

## 2018-01-24 DIAGNOSIS — L89324 Pressure ulcer of left buttock, stage 4: Secondary | ICD-10-CM | POA: Diagnosis not present

## 2018-01-24 DIAGNOSIS — E440 Moderate protein-calorie malnutrition: Secondary | ICD-10-CM | POA: Diagnosis not present

## 2018-01-24 DIAGNOSIS — C9190 Lymphoid leukemia, unspecified not having achieved remission: Secondary | ICD-10-CM | POA: Diagnosis not present

## 2018-01-26 DIAGNOSIS — M48061 Spinal stenosis, lumbar region without neurogenic claudication: Secondary | ICD-10-CM | POA: Diagnosis not present

## 2018-01-26 DIAGNOSIS — I950 Idiopathic hypotension: Secondary | ICD-10-CM | POA: Diagnosis not present

## 2018-01-26 DIAGNOSIS — E440 Moderate protein-calorie malnutrition: Secondary | ICD-10-CM | POA: Diagnosis not present

## 2018-01-26 DIAGNOSIS — N182 Chronic kidney disease, stage 2 (mild): Secondary | ICD-10-CM | POA: Diagnosis not present

## 2018-01-26 DIAGNOSIS — I129 Hypertensive chronic kidney disease with stage 1 through stage 4 chronic kidney disease, or unspecified chronic kidney disease: Secondary | ICD-10-CM | POA: Diagnosis not present

## 2018-01-26 DIAGNOSIS — L03317 Cellulitis of buttock: Secondary | ICD-10-CM | POA: Diagnosis not present

## 2018-01-26 DIAGNOSIS — L89324 Pressure ulcer of left buttock, stage 4: Secondary | ICD-10-CM | POA: Diagnosis not present

## 2018-01-26 DIAGNOSIS — D631 Anemia in chronic kidney disease: Secondary | ICD-10-CM | POA: Diagnosis not present

## 2018-01-26 DIAGNOSIS — E1122 Type 2 diabetes mellitus with diabetic chronic kidney disease: Secondary | ICD-10-CM | POA: Diagnosis not present

## 2018-01-26 DIAGNOSIS — C9190 Lymphoid leukemia, unspecified not having achieved remission: Secondary | ICD-10-CM | POA: Diagnosis not present

## 2018-01-27 DIAGNOSIS — L89324 Pressure ulcer of left buttock, stage 4: Secondary | ICD-10-CM | POA: Diagnosis not present

## 2018-01-27 DIAGNOSIS — C9190 Lymphoid leukemia, unspecified not having achieved remission: Secondary | ICD-10-CM | POA: Diagnosis not present

## 2018-01-27 DIAGNOSIS — R69 Illness, unspecified: Secondary | ICD-10-CM | POA: Diagnosis not present

## 2018-01-27 DIAGNOSIS — I950 Idiopathic hypotension: Secondary | ICD-10-CM | POA: Diagnosis not present

## 2018-01-27 DIAGNOSIS — E440 Moderate protein-calorie malnutrition: Secondary | ICD-10-CM | POA: Diagnosis not present

## 2018-01-27 DIAGNOSIS — L03317 Cellulitis of buttock: Secondary | ICD-10-CM | POA: Diagnosis not present

## 2018-01-27 DIAGNOSIS — D631 Anemia in chronic kidney disease: Secondary | ICD-10-CM | POA: Diagnosis not present

## 2018-01-27 DIAGNOSIS — E1122 Type 2 diabetes mellitus with diabetic chronic kidney disease: Secondary | ICD-10-CM | POA: Diagnosis not present

## 2018-01-27 DIAGNOSIS — M48061 Spinal stenosis, lumbar region without neurogenic claudication: Secondary | ICD-10-CM | POA: Diagnosis not present

## 2018-01-27 DIAGNOSIS — I129 Hypertensive chronic kidney disease with stage 1 through stage 4 chronic kidney disease, or unspecified chronic kidney disease: Secondary | ICD-10-CM | POA: Diagnosis not present

## 2018-01-27 DIAGNOSIS — N182 Chronic kidney disease, stage 2 (mild): Secondary | ICD-10-CM | POA: Diagnosis not present

## 2018-01-28 DIAGNOSIS — C9190 Lymphoid leukemia, unspecified not having achieved remission: Secondary | ICD-10-CM | POA: Diagnosis not present

## 2018-01-28 DIAGNOSIS — N182 Chronic kidney disease, stage 2 (mild): Secondary | ICD-10-CM | POA: Diagnosis not present

## 2018-01-28 DIAGNOSIS — D631 Anemia in chronic kidney disease: Secondary | ICD-10-CM | POA: Diagnosis not present

## 2018-01-28 DIAGNOSIS — M48061 Spinal stenosis, lumbar region without neurogenic claudication: Secondary | ICD-10-CM | POA: Diagnosis not present

## 2018-01-28 DIAGNOSIS — E1122 Type 2 diabetes mellitus with diabetic chronic kidney disease: Secondary | ICD-10-CM | POA: Diagnosis not present

## 2018-01-28 DIAGNOSIS — I950 Idiopathic hypotension: Secondary | ICD-10-CM | POA: Diagnosis not present

## 2018-01-28 DIAGNOSIS — I129 Hypertensive chronic kidney disease with stage 1 through stage 4 chronic kidney disease, or unspecified chronic kidney disease: Secondary | ICD-10-CM | POA: Diagnosis not present

## 2018-01-28 DIAGNOSIS — E440 Moderate protein-calorie malnutrition: Secondary | ICD-10-CM | POA: Diagnosis not present

## 2018-01-28 DIAGNOSIS — L03317 Cellulitis of buttock: Secondary | ICD-10-CM | POA: Diagnosis not present

## 2018-01-28 DIAGNOSIS — L89324 Pressure ulcer of left buttock, stage 4: Secondary | ICD-10-CM | POA: Diagnosis not present

## 2018-01-29 DIAGNOSIS — L03317 Cellulitis of buttock: Secondary | ICD-10-CM | POA: Diagnosis not present

## 2018-01-29 DIAGNOSIS — I950 Idiopathic hypotension: Secondary | ICD-10-CM | POA: Diagnosis not present

## 2018-01-29 DIAGNOSIS — N182 Chronic kidney disease, stage 2 (mild): Secondary | ICD-10-CM | POA: Diagnosis not present

## 2018-01-29 DIAGNOSIS — E440 Moderate protein-calorie malnutrition: Secondary | ICD-10-CM | POA: Diagnosis not present

## 2018-01-29 DIAGNOSIS — D631 Anemia in chronic kidney disease: Secondary | ICD-10-CM | POA: Diagnosis not present

## 2018-01-29 DIAGNOSIS — M48061 Spinal stenosis, lumbar region without neurogenic claudication: Secondary | ICD-10-CM | POA: Diagnosis not present

## 2018-01-29 DIAGNOSIS — C9190 Lymphoid leukemia, unspecified not having achieved remission: Secondary | ICD-10-CM | POA: Diagnosis not present

## 2018-01-29 DIAGNOSIS — I129 Hypertensive chronic kidney disease with stage 1 through stage 4 chronic kidney disease, or unspecified chronic kidney disease: Secondary | ICD-10-CM | POA: Diagnosis not present

## 2018-01-29 DIAGNOSIS — E1122 Type 2 diabetes mellitus with diabetic chronic kidney disease: Secondary | ICD-10-CM | POA: Diagnosis not present

## 2018-01-29 DIAGNOSIS — L89324 Pressure ulcer of left buttock, stage 4: Secondary | ICD-10-CM | POA: Diagnosis not present

## 2018-01-31 DIAGNOSIS — N182 Chronic kidney disease, stage 2 (mild): Secondary | ICD-10-CM | POA: Diagnosis not present

## 2018-01-31 DIAGNOSIS — L03317 Cellulitis of buttock: Secondary | ICD-10-CM | POA: Diagnosis not present

## 2018-01-31 DIAGNOSIS — E440 Moderate protein-calorie malnutrition: Secondary | ICD-10-CM | POA: Diagnosis not present

## 2018-01-31 DIAGNOSIS — L89324 Pressure ulcer of left buttock, stage 4: Secondary | ICD-10-CM | POA: Diagnosis not present

## 2018-01-31 DIAGNOSIS — I950 Idiopathic hypotension: Secondary | ICD-10-CM | POA: Diagnosis not present

## 2018-01-31 DIAGNOSIS — I129 Hypertensive chronic kidney disease with stage 1 through stage 4 chronic kidney disease, or unspecified chronic kidney disease: Secondary | ICD-10-CM | POA: Diagnosis not present

## 2018-01-31 DIAGNOSIS — E1122 Type 2 diabetes mellitus with diabetic chronic kidney disease: Secondary | ICD-10-CM | POA: Diagnosis not present

## 2018-01-31 DIAGNOSIS — C9190 Lymphoid leukemia, unspecified not having achieved remission: Secondary | ICD-10-CM | POA: Diagnosis not present

## 2018-01-31 DIAGNOSIS — M48061 Spinal stenosis, lumbar region without neurogenic claudication: Secondary | ICD-10-CM | POA: Diagnosis not present

## 2018-01-31 DIAGNOSIS — D631 Anemia in chronic kidney disease: Secondary | ICD-10-CM | POA: Diagnosis not present

## 2018-02-01 DIAGNOSIS — L89324 Pressure ulcer of left buttock, stage 4: Secondary | ICD-10-CM | POA: Diagnosis not present

## 2018-02-02 DIAGNOSIS — E1122 Type 2 diabetes mellitus with diabetic chronic kidney disease: Secondary | ICD-10-CM | POA: Diagnosis not present

## 2018-02-02 DIAGNOSIS — I950 Idiopathic hypotension: Secondary | ICD-10-CM | POA: Diagnosis not present

## 2018-02-02 DIAGNOSIS — D631 Anemia in chronic kidney disease: Secondary | ICD-10-CM | POA: Diagnosis not present

## 2018-02-02 DIAGNOSIS — N182 Chronic kidney disease, stage 2 (mild): Secondary | ICD-10-CM | POA: Diagnosis not present

## 2018-02-02 DIAGNOSIS — I129 Hypertensive chronic kidney disease with stage 1 through stage 4 chronic kidney disease, or unspecified chronic kidney disease: Secondary | ICD-10-CM | POA: Diagnosis not present

## 2018-02-02 DIAGNOSIS — E440 Moderate protein-calorie malnutrition: Secondary | ICD-10-CM | POA: Diagnosis not present

## 2018-02-02 DIAGNOSIS — M48061 Spinal stenosis, lumbar region without neurogenic claudication: Secondary | ICD-10-CM | POA: Diagnosis not present

## 2018-02-02 DIAGNOSIS — L89324 Pressure ulcer of left buttock, stage 4: Secondary | ICD-10-CM | POA: Diagnosis not present

## 2018-02-02 DIAGNOSIS — C9190 Lymphoid leukemia, unspecified not having achieved remission: Secondary | ICD-10-CM | POA: Diagnosis not present

## 2018-02-02 DIAGNOSIS — L03317 Cellulitis of buttock: Secondary | ICD-10-CM | POA: Diagnosis not present

## 2018-02-03 DIAGNOSIS — E1122 Type 2 diabetes mellitus with diabetic chronic kidney disease: Secondary | ICD-10-CM | POA: Diagnosis not present

## 2018-02-03 DIAGNOSIS — E440 Moderate protein-calorie malnutrition: Secondary | ICD-10-CM | POA: Diagnosis not present

## 2018-02-03 DIAGNOSIS — L89324 Pressure ulcer of left buttock, stage 4: Secondary | ICD-10-CM | POA: Diagnosis not present

## 2018-02-03 DIAGNOSIS — L03317 Cellulitis of buttock: Secondary | ICD-10-CM | POA: Diagnosis not present

## 2018-02-03 DIAGNOSIS — N182 Chronic kidney disease, stage 2 (mild): Secondary | ICD-10-CM | POA: Diagnosis not present

## 2018-02-03 DIAGNOSIS — I950 Idiopathic hypotension: Secondary | ICD-10-CM | POA: Diagnosis not present

## 2018-02-03 DIAGNOSIS — I129 Hypertensive chronic kidney disease with stage 1 through stage 4 chronic kidney disease, or unspecified chronic kidney disease: Secondary | ICD-10-CM | POA: Diagnosis not present

## 2018-02-03 DIAGNOSIS — M48061 Spinal stenosis, lumbar region without neurogenic claudication: Secondary | ICD-10-CM | POA: Diagnosis not present

## 2018-02-03 DIAGNOSIS — C9190 Lymphoid leukemia, unspecified not having achieved remission: Secondary | ICD-10-CM | POA: Diagnosis not present

## 2018-02-03 DIAGNOSIS — D631 Anemia in chronic kidney disease: Secondary | ICD-10-CM | POA: Diagnosis not present

## 2018-02-04 DIAGNOSIS — E1122 Type 2 diabetes mellitus with diabetic chronic kidney disease: Secondary | ICD-10-CM | POA: Diagnosis not present

## 2018-02-04 DIAGNOSIS — I950 Idiopathic hypotension: Secondary | ICD-10-CM | POA: Diagnosis not present

## 2018-02-04 DIAGNOSIS — D631 Anemia in chronic kidney disease: Secondary | ICD-10-CM | POA: Diagnosis not present

## 2018-02-04 DIAGNOSIS — L03317 Cellulitis of buttock: Secondary | ICD-10-CM | POA: Diagnosis not present

## 2018-02-04 DIAGNOSIS — E440 Moderate protein-calorie malnutrition: Secondary | ICD-10-CM | POA: Diagnosis not present

## 2018-02-04 DIAGNOSIS — I129 Hypertensive chronic kidney disease with stage 1 through stage 4 chronic kidney disease, or unspecified chronic kidney disease: Secondary | ICD-10-CM | POA: Diagnosis not present

## 2018-02-04 DIAGNOSIS — L89324 Pressure ulcer of left buttock, stage 4: Secondary | ICD-10-CM | POA: Diagnosis not present

## 2018-02-04 DIAGNOSIS — N182 Chronic kidney disease, stage 2 (mild): Secondary | ICD-10-CM | POA: Diagnosis not present

## 2018-02-04 DIAGNOSIS — M48061 Spinal stenosis, lumbar region without neurogenic claudication: Secondary | ICD-10-CM | POA: Diagnosis not present

## 2018-02-04 DIAGNOSIS — C9190 Lymphoid leukemia, unspecified not having achieved remission: Secondary | ICD-10-CM | POA: Diagnosis not present

## 2018-02-07 ENCOUNTER — Other Ambulatory Visit: Payer: Self-pay

## 2018-02-07 DIAGNOSIS — C9110 Chronic lymphocytic leukemia of B-cell type not having achieved remission: Secondary | ICD-10-CM

## 2018-02-07 DIAGNOSIS — L03317 Cellulitis of buttock: Secondary | ICD-10-CM | POA: Diagnosis not present

## 2018-02-07 DIAGNOSIS — D631 Anemia in chronic kidney disease: Secondary | ICD-10-CM | POA: Diagnosis not present

## 2018-02-07 DIAGNOSIS — E440 Moderate protein-calorie malnutrition: Secondary | ICD-10-CM | POA: Diagnosis not present

## 2018-02-07 DIAGNOSIS — M48061 Spinal stenosis, lumbar region without neurogenic claudication: Secondary | ICD-10-CM | POA: Diagnosis not present

## 2018-02-07 DIAGNOSIS — C9190 Lymphoid leukemia, unspecified not having achieved remission: Secondary | ICD-10-CM | POA: Diagnosis not present

## 2018-02-07 DIAGNOSIS — N182 Chronic kidney disease, stage 2 (mild): Secondary | ICD-10-CM | POA: Diagnosis not present

## 2018-02-07 DIAGNOSIS — I129 Hypertensive chronic kidney disease with stage 1 through stage 4 chronic kidney disease, or unspecified chronic kidney disease: Secondary | ICD-10-CM | POA: Diagnosis not present

## 2018-02-07 DIAGNOSIS — L89324 Pressure ulcer of left buttock, stage 4: Secondary | ICD-10-CM | POA: Diagnosis not present

## 2018-02-07 DIAGNOSIS — I950 Idiopathic hypotension: Secondary | ICD-10-CM | POA: Diagnosis not present

## 2018-02-07 DIAGNOSIS — E1122 Type 2 diabetes mellitus with diabetic chronic kidney disease: Secondary | ICD-10-CM | POA: Diagnosis not present

## 2018-02-08 ENCOUNTER — Telehealth: Payer: Self-pay | Admitting: Hematology and Oncology

## 2018-02-08 ENCOUNTER — Inpatient Hospital Stay (HOSPITAL_BASED_OUTPATIENT_CLINIC_OR_DEPARTMENT_OTHER): Payer: Medicare HMO | Admitting: Hematology and Oncology

## 2018-02-08 ENCOUNTER — Inpatient Hospital Stay: Payer: Medicare HMO | Attending: Hematology and Oncology

## 2018-02-08 ENCOUNTER — Encounter: Payer: Self-pay | Admitting: Hematology and Oncology

## 2018-02-08 DIAGNOSIS — D630 Anemia in neoplastic disease: Secondary | ICD-10-CM

## 2018-02-08 DIAGNOSIS — Z79899 Other long term (current) drug therapy: Secondary | ICD-10-CM | POA: Insufficient documentation

## 2018-02-08 DIAGNOSIS — Z7982 Long term (current) use of aspirin: Secondary | ICD-10-CM | POA: Insufficient documentation

## 2018-02-08 DIAGNOSIS — Z794 Long term (current) use of insulin: Secondary | ICD-10-CM | POA: Insufficient documentation

## 2018-02-08 DIAGNOSIS — C9110 Chronic lymphocytic leukemia of B-cell type not having achieved remission: Secondary | ICD-10-CM

## 2018-02-08 DIAGNOSIS — L8990 Pressure ulcer of unspecified site, unspecified stage: Secondary | ICD-10-CM

## 2018-02-08 DIAGNOSIS — D649 Anemia, unspecified: Secondary | ICD-10-CM | POA: Insufficient documentation

## 2018-02-08 DIAGNOSIS — E441 Mild protein-calorie malnutrition: Secondary | ICD-10-CM | POA: Insufficient documentation

## 2018-02-08 DIAGNOSIS — Z9221 Personal history of antineoplastic chemotherapy: Secondary | ICD-10-CM

## 2018-02-08 LAB — CBC WITH DIFFERENTIAL (CANCER CENTER ONLY)
Abs Immature Granulocytes: 0 10*3/uL (ref 0.00–0.07)
BASOS PCT: 3 %
Band Neutrophils: 1 %
Basophils Absolute: 0.3 10*3/uL — ABNORMAL HIGH (ref 0.0–0.1)
EOS ABS: 0.2 10*3/uL (ref 0.0–0.5)
EOS PCT: 2 %
HCT: 31.3 % — ABNORMAL LOW (ref 39.0–52.0)
HEMOGLOBIN: 9.5 g/dL — AB (ref 13.0–17.0)
LYMPHS PCT: 51 %
Lymphs Abs: 4.4 10*3/uL — ABNORMAL HIGH (ref 0.7–4.0)
MCH: 25.5 pg — ABNORMAL LOW (ref 26.0–34.0)
MCHC: 30.4 g/dL (ref 30.0–36.0)
MCV: 83.9 fL (ref 80.0–100.0)
MONO ABS: 0.3 10*3/uL (ref 0.1–1.0)
Monocytes Relative: 4 %
NEUTROS PCT: 39 %
Neutro Abs: 3.4 10*3/uL (ref 1.7–17.7)
PLATELETS: 184 10*3/uL (ref 150–400)
RBC: 3.73 MIL/uL — ABNORMAL LOW (ref 4.22–5.81)
RDW: 16.4 % — AB (ref 11.5–15.5)
WBC: 8.6 10*3/uL (ref 4.0–10.5)
nRBC: 0 % (ref 0.0–0.2)

## 2018-02-08 LAB — CMP (CANCER CENTER ONLY)
ALT: 8 U/L (ref 0–44)
ANION GAP: 8 (ref 5–15)
AST: 14 U/L — ABNORMAL LOW (ref 15–41)
Albumin: 3.2 g/dL — ABNORMAL LOW (ref 3.5–5.0)
Alkaline Phosphatase: 57 U/L (ref 38–126)
BILIRUBIN TOTAL: 0.3 mg/dL (ref 0.3–1.2)
BUN: 21 mg/dL (ref 8–23)
CALCIUM: 8.8 mg/dL — AB (ref 8.9–10.3)
CO2: 27 mmol/L (ref 22–32)
Chloride: 104 mmol/L (ref 98–111)
Creatinine: 0.93 mg/dL (ref 0.61–1.24)
GFR, Est AFR Am: >60 mL/min (ref >60)
Glucose, Bld: 117 mg/dL — ABNORMAL HIGH (ref 70–99)
Potassium: 4.7 mmol/L (ref 3.5–5.1)
Sodium: 139 mmol/L (ref 135–145)
TOTAL PROTEIN: 6 g/dL — AB (ref 6.5–8.1)

## 2018-02-08 LAB — LACTATE DEHYDROGENASE: LDH: 186 U/L (ref 98–192)

## 2018-02-08 NOTE — Telephone Encounter (Signed)
Gave avs and calendar ° °

## 2018-02-08 NOTE — Assessment & Plan Note (Signed)
Anemia is improving He is not symptomatic Observe only

## 2018-02-08 NOTE — Assessment & Plan Note (Addendum)
According to the patient, this is improving.  He has home care nurse to monitor his wound. For this reason, I do not feel comfortable with resumption of chemotherapy

## 2018-02-08 NOTE — Assessment & Plan Note (Signed)
His nutritional status is improved. His leg swelling has improved However, he has persistent decubitus ulcer Recommend him to continue oral intake as tolerated.  He has gained almost 10 pounds since last time I saw him

## 2018-02-08 NOTE — Progress Notes (Signed)
[place] OFFICE PROGRESS NOTE  Patient Care Team: Jani Gravel, MD as PCP - General (Internal Medicine) Heath Lark, MD as Consulting Physician (Hematology and Oncology)  ASSESSMENT & PLAN:  CLL (chronic lymphocytic leukemia) ([place]) He has mild lymphocytosis in his blood The patient is not ready to resume ibrutinib due to persistent decubitus ulcer I plan to see him again in a month for further follow-up  Anemia in neoplastic disease Anemia is improving He is not symptomatic Observe only  Protein-calorie malnutrition, mild ([place]) His nutritional status is improved. His leg swelling has improved However, he has persistent decubitus ulcer Recommend him to continue oral intake as tolerated.  He has gained almost 10 pounds since last time I saw him  Pressure ulcer According to the patient, this is improving.  He has home care nurse to monitor his wound. For this reason, I do not feel comfortable with resumption of chemotherapy   No orders of the defined types were placed in this encounter.   INTERVAL HISTORY: Please see below for problem oriented charting. He returns with his family for further follow-up He is doing well at home Decubitus ulcer is improving He is eating better and has gained 10 pounds of weight No new lymphadenopathy Denies recent infection, fever or chills  SUMMARY OF ONCOLOGIC HISTORY:   CLL (chronic lymphocytic leukemia) ([place])   04/03/2008 Pathology Results    Case #: [id number] FLow cytomtry confirmed CLL    10/11/2014 Tumor Marker    FISH analysis showed trisomy 12 abnormality    04/29/2015 -  Chemotherapy    He started taking Ibrutinib    04/29/2015 Imaging    Ct scan showed mild lymphadenopathy and splenomegaly    05/13/2015 Adverse Reaction    Due to neutropenia, dose of Ibrutinib is reduced to 280 mg daily    01/08/2016 Imaging    Response to therapy. Resolution of thoracic and abdominal adenopathy. Resolution of splenomegaly. 2. No  new or progressive disease. 3. Small bowel containing left inguinal hernia and right-sided hydrocele, as before. 4. Esophageal air fluid level suggests dysmotility or gastroesophageal reflux. 5. Possible constipation. 6. Suspect hepatic hemangiomas, similar.    10/09/2016 Imaging    No findings suspicious for active lymphomatous involvement.  No abdominopelvic lymphadenopathy. Spleen is normal in size.  Moderate to large left inguinal/scrotal hernia containing small bowel and fat. No evidence of bowel obstruction.  Additional stable ancillary findings as above.    04/26/2017 Imaging    1. No lymphadenopathy in the chest, abdomen, or pelvis. 2. Mild circumferential bladder wall thickening. Incomplete distention may contribute to this appearance, but bladder infection is also consideration. 3. Stable appearance small hepatic lesions. Imaging features most suggestive of benign cavernous hemangiomas. 4. Stable tiny low-density renal lesions compatible with cysts. 5. Large left groin hernia contains numerous small bowel loops without complicating features. 6. Right-sided hydrocele.    11/15/2017 Imaging    Large recurrent left inguinal hernia with extension of multiple small bowel loops into the inguinal canal and scrotum on the left. Proximal small bowel dilatation is noted consistent with a degree of incarceration.  Bilateral small effusions as well as patchy bibasilar infiltrates.     12/14/2017 Adverse Reaction    Ibrutinib placed on hold     REVIEW OF SYSTEMS:   Constitutional: Denies fevers, chills or abnormal weight loss Eyes: Denies blurriness of vision Ears, nose, mouth, throat, and face: Denies mucositis or sore throat Respiratory: Denies cough, dyspnea or wheezes Cardiovascular: Denies palpitation,  chest discomfort or lower extremity swelling Gastrointestinal:  Denies nausea, heartburn or change in bowel habits Skin: Denies abnormal skin rashes Lymphatics: Denies new  lymphadenopathy or easy bruising Neurological:Denies numbness, tingling or new weaknesses Behavioral/Psych: Mood is stable, no new changes  All other systems were reviewed with the patient and are negative.  I have reviewed the past medical history, past surgical history, social history and family history with the patient and they are unchanged from previous note.  ALLERGIES:  has No Known Allergies.  MEDICATIONS:  Current Outpatient Medications  Medication Sig Dispense Refill  . acetaminophen (TYLENOL) 650 MG CR tablet Take 1 tablet (650 mg total) by mouth 2 (two) times daily as needed for pain. 30 tablet 0  . aspirin EC 81 MG tablet Take 81 mg by mouth daily.    . Capsaicin (ASPERCREME PAIN RELIEF PATCH EX) Apply 1 patch topically daily. Apply 1 patch to left shoulder 12 hrs/day. On at 8 am, off at 8 pm    . Cholecalciferol (VITAMIN D3) 1000 units CAPS Take 1,000 Units by mouth 2 (two) times daily.     . collagenase (SANTYL) ointment Apply 1 application topically daily.    . Cyanocobalamin (VITAMIN B 12 PO) Take 1,000 mcg by mouth daily.    Marland Kitchen gabapentin (NEURONTIN) 100 MG capsule Take 100 mg by mouth 3 (three) times daily.    . insulin NPH-regular Human (70-30) 100 UNIT/ML injection Inject 15 Units into the skin 2 (two) times daily with a meal.    . mirtazapine (REMERON) 15 MG tablet Take 1 tablet (15 mg total) by mouth at bedtime.    . Multiple Vitamin (MULTIVITAMIN WITH MINERALS) TABS tablet Take 1 tablet by mouth daily.    . Nutritional Supplements (GLUCERNA 1.0 CAL) LIQD Take 30 Cans by mouth 2 (two) times daily.    Marland Kitchen oxyCODONE (OXY IR/ROXICODONE) 5 MG immediate release tablet Take 1 tablet (5 mg total) by mouth every 6 (six) hours as needed for moderate pain, severe pain or breakthrough pain. 5 tablet 0  . Polyethyl Glycol-Propyl Glycol (SYSTANE OP) Place 1 drop into both eyes 2 (two) times daily.    . polyethylene glycol (MIRALAX / GLYCOLAX) packet Take 17 g by mouth daily. 14 each 0   . tamsulosin (FLOMAX) 0.4 MG CAPS capsule Take 0.4 mg by mouth daily.     No current facility-administered medications for this visit.     PHYSICAL EXAMINATION: ECOG PERFORMANCE STATUS: 2 - Symptomatic, <50% confined to bed  Vitals:   02/08/18 1135  BP: (!) 114/41  Pulse: 74  Resp: 18  Temp: 97.9 F (36.6 C)  SpO2: 100%   Filed Weights   02/08/18 1135  Weight: 182 lb 6.4 oz (82.7 kg)    GENERAL:alert, no distress and comfortable SKIN: skin color, texture, turgor are normal, no rashes or significant lesions EYES: normal, Conjunctiva are pink and non-injected, sclera clear OROPHARYNX:no exudate, no erythema and lips, buccal mucosa, and tongue normal  NECK: supple, thyroid normal size, non-tender, without nodularity LYMPH:  no palpable lymphadenopathy in the cervical, axillary or inguinal LUNGS: clear to auscultation and percussion with normal breathing effort HEART: regular rate & rhythm and no murmurs and no lower extremity edema ABDOMEN:abdomen soft, non-tender and normal bowel sounds Musculoskeletal:no cyanosis of digits and no clubbing  NEURO: alert & oriented x 3 with fluent speech, no focal motor/sensory deficits  LABORATORY DATA:  I have reviewed the data as listed    Component Value Date/Time   NA  139 02/08/2018 1059   NA 140 10/09/2016 1106   K 4.7 02/08/2018 1059   K 4.7 10/09/2016 1106   CL 104 02/08/2018 1059   CL 104 03/28/2012 1426   CO2 27 02/08/2018 1059   CO2 28 10/09/2016 1106   GLUCOSE 117 (H) 02/08/2018 1059   GLUCOSE 127 10/09/2016 1106   GLUCOSE 171 (H) 03/28/2012 1426   BUN 21 02/08/2018 1059   BUN 20.8 10/09/2016 1106   CREATININE 0.93 02/08/2018 1059   CREATININE 1.1 10/09/2016 1106   CALCIUM 8.8 (L) 02/08/2018 1059   CALCIUM 9.1 10/09/2016 1106   PROT 6.0 (L) 02/08/2018 1059   PROT 5.8 (L) 10/09/2016 1106   ALBUMIN 3.2 (L) 02/08/2018 1059   ALBUMIN 3.1 (L) 10/09/2016 1106   AST 14 (L) 02/08/2018 1059   AST 17 10/09/2016 1106   ALT  8 02/08/2018 1059   ALT 8 10/09/2016 1106   ALKPHOS 57 02/08/2018 1059   ALKPHOS 46 10/09/2016 1106   BILITOT 0.3 02/08/2018 1059   BILITOT 0.23 10/09/2016 1106   GFRNONAA >60 02/08/2018 1059   GFRAA >60 02/08/2018 1059    No results found for: SPEP, UPEP  Lab Results  Component Value Date   WBC 8.6 02/08/2018   NEUTROABS 3.4 02/08/2018   HGB 9.5 (L) 02/08/2018   HCT 31.3 (L) 02/08/2018   MCV 83.9 02/08/2018   PLT 184 02/08/2018      Chemistry      Component Value Date/Time   NA 139 02/08/2018 1059   NA 140 10/09/2016 1106   K 4.7 02/08/2018 1059   K 4.7 10/09/2016 1106   CL 104 02/08/2018 1059   CL 104 03/28/2012 1426   CO2 27 02/08/2018 1059   CO2 28 10/09/2016 1106   BUN 21 02/08/2018 1059   BUN 20.8 10/09/2016 1106   CREATININE 0.93 02/08/2018 1059   CREATININE 1.1 10/09/2016 1106      Component Value Date/Time   CALCIUM 8.8 (L) 02/08/2018 1059   CALCIUM 9.1 10/09/2016 1106   ALKPHOS 57 02/08/2018 1059   ALKPHOS 46 10/09/2016 1106   AST 14 (L) 02/08/2018 1059   AST 17 10/09/2016 1106   ALT 8 02/08/2018 1059   ALT 8 10/09/2016 1106   BILITOT 0.3 02/08/2018 1059   BILITOT 0.23 10/09/2016 1106     All questions were answered. The patient knows to call the clinic with any problems, questions or concerns. No barriers to learning was detected.  I spent 15 minutes counseling the patient face to face. The total time spent in the appointment was 20 minutes and more than 50% was on counseling and review of test results  Heath Lark, MD 02/08/2018 1:18 PM

## 2018-02-08 NOTE — Assessment & Plan Note (Signed)
He has mild lymphocytosis in his blood The patient is not ready to resume ibrutinib due to persistent decubitus ulcer I plan to see him again in a month for further follow-up

## 2018-02-09 DIAGNOSIS — I950 Idiopathic hypotension: Secondary | ICD-10-CM | POA: Diagnosis not present

## 2018-02-09 DIAGNOSIS — I129 Hypertensive chronic kidney disease with stage 1 through stage 4 chronic kidney disease, or unspecified chronic kidney disease: Secondary | ICD-10-CM | POA: Diagnosis not present

## 2018-02-09 DIAGNOSIS — C9190 Lymphoid leukemia, unspecified not having achieved remission: Secondary | ICD-10-CM | POA: Diagnosis not present

## 2018-02-09 DIAGNOSIS — M48061 Spinal stenosis, lumbar region without neurogenic claudication: Secondary | ICD-10-CM | POA: Diagnosis not present

## 2018-02-09 DIAGNOSIS — E1122 Type 2 diabetes mellitus with diabetic chronic kidney disease: Secondary | ICD-10-CM | POA: Diagnosis not present

## 2018-02-09 DIAGNOSIS — L03317 Cellulitis of buttock: Secondary | ICD-10-CM | POA: Diagnosis not present

## 2018-02-09 DIAGNOSIS — L89324 Pressure ulcer of left buttock, stage 4: Secondary | ICD-10-CM | POA: Diagnosis not present

## 2018-02-09 DIAGNOSIS — D631 Anemia in chronic kidney disease: Secondary | ICD-10-CM | POA: Diagnosis not present

## 2018-02-09 DIAGNOSIS — E440 Moderate protein-calorie malnutrition: Secondary | ICD-10-CM | POA: Diagnosis not present

## 2018-02-09 DIAGNOSIS — N182 Chronic kidney disease, stage 2 (mild): Secondary | ICD-10-CM | POA: Diagnosis not present

## 2018-02-10 DIAGNOSIS — C9190 Lymphoid leukemia, unspecified not having achieved remission: Secondary | ICD-10-CM | POA: Diagnosis not present

## 2018-02-10 DIAGNOSIS — D631 Anemia in chronic kidney disease: Secondary | ICD-10-CM | POA: Diagnosis not present

## 2018-02-10 DIAGNOSIS — E440 Moderate protein-calorie malnutrition: Secondary | ICD-10-CM | POA: Diagnosis not present

## 2018-02-10 DIAGNOSIS — L89324 Pressure ulcer of left buttock, stage 4: Secondary | ICD-10-CM | POA: Diagnosis not present

## 2018-02-10 DIAGNOSIS — M48061 Spinal stenosis, lumbar region without neurogenic claudication: Secondary | ICD-10-CM | POA: Diagnosis not present

## 2018-02-10 DIAGNOSIS — N182 Chronic kidney disease, stage 2 (mild): Secondary | ICD-10-CM | POA: Diagnosis not present

## 2018-02-10 DIAGNOSIS — I950 Idiopathic hypotension: Secondary | ICD-10-CM | POA: Diagnosis not present

## 2018-02-10 DIAGNOSIS — L03317 Cellulitis of buttock: Secondary | ICD-10-CM | POA: Diagnosis not present

## 2018-02-10 DIAGNOSIS — I129 Hypertensive chronic kidney disease with stage 1 through stage 4 chronic kidney disease, or unspecified chronic kidney disease: Secondary | ICD-10-CM | POA: Diagnosis not present

## 2018-02-10 DIAGNOSIS — E1122 Type 2 diabetes mellitus with diabetic chronic kidney disease: Secondary | ICD-10-CM | POA: Diagnosis not present

## 2018-02-12 DIAGNOSIS — C9190 Lymphoid leukemia, unspecified not having achieved remission: Secondary | ICD-10-CM | POA: Diagnosis not present

## 2018-02-12 DIAGNOSIS — I950 Idiopathic hypotension: Secondary | ICD-10-CM | POA: Diagnosis not present

## 2018-02-12 DIAGNOSIS — D631 Anemia in chronic kidney disease: Secondary | ICD-10-CM | POA: Diagnosis not present

## 2018-02-12 DIAGNOSIS — E1122 Type 2 diabetes mellitus with diabetic chronic kidney disease: Secondary | ICD-10-CM | POA: Diagnosis not present

## 2018-02-12 DIAGNOSIS — L89324 Pressure ulcer of left buttock, stage 4: Secondary | ICD-10-CM | POA: Diagnosis not present

## 2018-02-12 DIAGNOSIS — N182 Chronic kidney disease, stage 2 (mild): Secondary | ICD-10-CM | POA: Diagnosis not present

## 2018-02-12 DIAGNOSIS — E440 Moderate protein-calorie malnutrition: Secondary | ICD-10-CM | POA: Diagnosis not present

## 2018-02-12 DIAGNOSIS — L03317 Cellulitis of buttock: Secondary | ICD-10-CM | POA: Diagnosis not present

## 2018-02-12 DIAGNOSIS — I129 Hypertensive chronic kidney disease with stage 1 through stage 4 chronic kidney disease, or unspecified chronic kidney disease: Secondary | ICD-10-CM | POA: Diagnosis not present

## 2018-02-12 DIAGNOSIS — M48061 Spinal stenosis, lumbar region without neurogenic claudication: Secondary | ICD-10-CM | POA: Diagnosis not present

## 2018-02-14 DIAGNOSIS — L03317 Cellulitis of buttock: Secondary | ICD-10-CM | POA: Diagnosis not present

## 2018-02-14 DIAGNOSIS — N182 Chronic kidney disease, stage 2 (mild): Secondary | ICD-10-CM | POA: Diagnosis not present

## 2018-02-14 DIAGNOSIS — I129 Hypertensive chronic kidney disease with stage 1 through stage 4 chronic kidney disease, or unspecified chronic kidney disease: Secondary | ICD-10-CM | POA: Diagnosis not present

## 2018-02-14 DIAGNOSIS — L89324 Pressure ulcer of left buttock, stage 4: Secondary | ICD-10-CM | POA: Diagnosis not present

## 2018-02-14 DIAGNOSIS — I950 Idiopathic hypotension: Secondary | ICD-10-CM | POA: Diagnosis not present

## 2018-02-14 DIAGNOSIS — E440 Moderate protein-calorie malnutrition: Secondary | ICD-10-CM | POA: Diagnosis not present

## 2018-02-14 DIAGNOSIS — E1122 Type 2 diabetes mellitus with diabetic chronic kidney disease: Secondary | ICD-10-CM | POA: Diagnosis not present

## 2018-02-14 DIAGNOSIS — C9190 Lymphoid leukemia, unspecified not having achieved remission: Secondary | ICD-10-CM | POA: Diagnosis not present

## 2018-02-14 DIAGNOSIS — D631 Anemia in chronic kidney disease: Secondary | ICD-10-CM | POA: Diagnosis not present

## 2018-02-14 DIAGNOSIS — M48061 Spinal stenosis, lumbar region without neurogenic claudication: Secondary | ICD-10-CM | POA: Diagnosis not present

## 2018-02-15 DIAGNOSIS — I950 Idiopathic hypotension: Secondary | ICD-10-CM | POA: Diagnosis not present

## 2018-02-15 DIAGNOSIS — L03317 Cellulitis of buttock: Secondary | ICD-10-CM | POA: Diagnosis not present

## 2018-02-15 DIAGNOSIS — L89324 Pressure ulcer of left buttock, stage 4: Secondary | ICD-10-CM | POA: Diagnosis not present

## 2018-02-15 DIAGNOSIS — C9190 Lymphoid leukemia, unspecified not having achieved remission: Secondary | ICD-10-CM | POA: Diagnosis not present

## 2018-02-15 DIAGNOSIS — I129 Hypertensive chronic kidney disease with stage 1 through stage 4 chronic kidney disease, or unspecified chronic kidney disease: Secondary | ICD-10-CM | POA: Diagnosis not present

## 2018-02-15 DIAGNOSIS — E440 Moderate protein-calorie malnutrition: Secondary | ICD-10-CM | POA: Diagnosis not present

## 2018-02-15 DIAGNOSIS — D631 Anemia in chronic kidney disease: Secondary | ICD-10-CM | POA: Diagnosis not present

## 2018-02-15 DIAGNOSIS — E1122 Type 2 diabetes mellitus with diabetic chronic kidney disease: Secondary | ICD-10-CM | POA: Diagnosis not present

## 2018-02-15 DIAGNOSIS — M48061 Spinal stenosis, lumbar region without neurogenic claudication: Secondary | ICD-10-CM | POA: Diagnosis not present

## 2018-02-15 DIAGNOSIS — N182 Chronic kidney disease, stage 2 (mild): Secondary | ICD-10-CM | POA: Diagnosis not present

## 2018-02-16 DIAGNOSIS — L03317 Cellulitis of buttock: Secondary | ICD-10-CM | POA: Diagnosis not present

## 2018-02-16 DIAGNOSIS — L89324 Pressure ulcer of left buttock, stage 4: Secondary | ICD-10-CM | POA: Diagnosis not present

## 2018-02-16 DIAGNOSIS — E440 Moderate protein-calorie malnutrition: Secondary | ICD-10-CM | POA: Diagnosis not present

## 2018-02-16 DIAGNOSIS — D631 Anemia in chronic kidney disease: Secondary | ICD-10-CM | POA: Diagnosis not present

## 2018-02-16 DIAGNOSIS — I950 Idiopathic hypotension: Secondary | ICD-10-CM | POA: Diagnosis not present

## 2018-02-16 DIAGNOSIS — M48061 Spinal stenosis, lumbar region without neurogenic claudication: Secondary | ICD-10-CM | POA: Diagnosis not present

## 2018-02-16 DIAGNOSIS — N182 Chronic kidney disease, stage 2 (mild): Secondary | ICD-10-CM | POA: Diagnosis not present

## 2018-02-16 DIAGNOSIS — C9190 Lymphoid leukemia, unspecified not having achieved remission: Secondary | ICD-10-CM | POA: Diagnosis not present

## 2018-02-16 DIAGNOSIS — E1122 Type 2 diabetes mellitus with diabetic chronic kidney disease: Secondary | ICD-10-CM | POA: Diagnosis not present

## 2018-02-16 DIAGNOSIS — I129 Hypertensive chronic kidney disease with stage 1 through stage 4 chronic kidney disease, or unspecified chronic kidney disease: Secondary | ICD-10-CM | POA: Diagnosis not present

## 2018-02-17 DIAGNOSIS — C9190 Lymphoid leukemia, unspecified not having achieved remission: Secondary | ICD-10-CM | POA: Diagnosis not present

## 2018-02-17 DIAGNOSIS — L89324 Pressure ulcer of left buttock, stage 4: Secondary | ICD-10-CM | POA: Diagnosis not present

## 2018-02-17 DIAGNOSIS — D631 Anemia in chronic kidney disease: Secondary | ICD-10-CM | POA: Diagnosis not present

## 2018-02-17 DIAGNOSIS — N182 Chronic kidney disease, stage 2 (mild): Secondary | ICD-10-CM | POA: Diagnosis not present

## 2018-02-17 DIAGNOSIS — E440 Moderate protein-calorie malnutrition: Secondary | ICD-10-CM | POA: Diagnosis not present

## 2018-02-17 DIAGNOSIS — I129 Hypertensive chronic kidney disease with stage 1 through stage 4 chronic kidney disease, or unspecified chronic kidney disease: Secondary | ICD-10-CM | POA: Diagnosis not present

## 2018-02-17 DIAGNOSIS — E1122 Type 2 diabetes mellitus with diabetic chronic kidney disease: Secondary | ICD-10-CM | POA: Diagnosis not present

## 2018-02-17 DIAGNOSIS — I950 Idiopathic hypotension: Secondary | ICD-10-CM | POA: Diagnosis not present

## 2018-02-17 DIAGNOSIS — L03317 Cellulitis of buttock: Secondary | ICD-10-CM | POA: Diagnosis not present

## 2018-02-17 DIAGNOSIS — M48061 Spinal stenosis, lumbar region without neurogenic claudication: Secondary | ICD-10-CM | POA: Diagnosis not present

## 2018-02-18 DIAGNOSIS — L89324 Pressure ulcer of left buttock, stage 4: Secondary | ICD-10-CM | POA: Diagnosis not present

## 2018-02-18 DIAGNOSIS — D631 Anemia in chronic kidney disease: Secondary | ICD-10-CM | POA: Diagnosis not present

## 2018-02-18 DIAGNOSIS — N182 Chronic kidney disease, stage 2 (mild): Secondary | ICD-10-CM | POA: Diagnosis not present

## 2018-02-18 DIAGNOSIS — L03317 Cellulitis of buttock: Secondary | ICD-10-CM | POA: Diagnosis not present

## 2018-02-18 DIAGNOSIS — I950 Idiopathic hypotension: Secondary | ICD-10-CM | POA: Diagnosis not present

## 2018-02-18 DIAGNOSIS — I129 Hypertensive chronic kidney disease with stage 1 through stage 4 chronic kidney disease, or unspecified chronic kidney disease: Secondary | ICD-10-CM | POA: Diagnosis not present

## 2018-02-18 DIAGNOSIS — E1122 Type 2 diabetes mellitus with diabetic chronic kidney disease: Secondary | ICD-10-CM | POA: Diagnosis not present

## 2018-02-18 DIAGNOSIS — M48061 Spinal stenosis, lumbar region without neurogenic claudication: Secondary | ICD-10-CM | POA: Diagnosis not present

## 2018-02-18 DIAGNOSIS — C9190 Lymphoid leukemia, unspecified not having achieved remission: Secondary | ICD-10-CM | POA: Diagnosis not present

## 2018-02-18 DIAGNOSIS — E440 Moderate protein-calorie malnutrition: Secondary | ICD-10-CM | POA: Diagnosis not present

## 2018-02-21 DIAGNOSIS — E1122 Type 2 diabetes mellitus with diabetic chronic kidney disease: Secondary | ICD-10-CM | POA: Diagnosis not present

## 2018-02-21 DIAGNOSIS — M48061 Spinal stenosis, lumbar region without neurogenic claudication: Secondary | ICD-10-CM | POA: Diagnosis not present

## 2018-02-21 DIAGNOSIS — I950 Idiopathic hypotension: Secondary | ICD-10-CM | POA: Diagnosis not present

## 2018-02-21 DIAGNOSIS — E118 Type 2 diabetes mellitus with unspecified complications: Secondary | ICD-10-CM | POA: Diagnosis not present

## 2018-02-21 DIAGNOSIS — L03317 Cellulitis of buttock: Secondary | ICD-10-CM | POA: Diagnosis not present

## 2018-02-21 DIAGNOSIS — C9190 Lymphoid leukemia, unspecified not having achieved remission: Secondary | ICD-10-CM | POA: Diagnosis not present

## 2018-02-21 DIAGNOSIS — E440 Moderate protein-calorie malnutrition: Secondary | ICD-10-CM | POA: Diagnosis not present

## 2018-02-21 DIAGNOSIS — N182 Chronic kidney disease, stage 2 (mild): Secondary | ICD-10-CM | POA: Diagnosis not present

## 2018-02-21 DIAGNOSIS — Z79899 Other long term (current) drug therapy: Secondary | ICD-10-CM | POA: Diagnosis not present

## 2018-02-21 DIAGNOSIS — I10 Essential (primary) hypertension: Secondary | ICD-10-CM | POA: Diagnosis not present

## 2018-02-21 DIAGNOSIS — I129 Hypertensive chronic kidney disease with stage 1 through stage 4 chronic kidney disease, or unspecified chronic kidney disease: Secondary | ICD-10-CM | POA: Diagnosis not present

## 2018-02-21 DIAGNOSIS — N390 Urinary tract infection, site not specified: Secondary | ICD-10-CM | POA: Diagnosis not present

## 2018-02-21 DIAGNOSIS — L89324 Pressure ulcer of left buttock, stage 4: Secondary | ICD-10-CM | POA: Diagnosis not present

## 2018-02-21 DIAGNOSIS — D631 Anemia in chronic kidney disease: Secondary | ICD-10-CM | POA: Diagnosis not present

## 2018-02-24 DIAGNOSIS — L89324 Pressure ulcer of left buttock, stage 4: Secondary | ICD-10-CM | POA: Diagnosis not present

## 2018-02-24 DIAGNOSIS — L03317 Cellulitis of buttock: Secondary | ICD-10-CM | POA: Diagnosis not present

## 2018-02-24 DIAGNOSIS — I129 Hypertensive chronic kidney disease with stage 1 through stage 4 chronic kidney disease, or unspecified chronic kidney disease: Secondary | ICD-10-CM | POA: Diagnosis not present

## 2018-02-24 DIAGNOSIS — M48061 Spinal stenosis, lumbar region without neurogenic claudication: Secondary | ICD-10-CM | POA: Diagnosis not present

## 2018-02-24 DIAGNOSIS — N182 Chronic kidney disease, stage 2 (mild): Secondary | ICD-10-CM | POA: Diagnosis not present

## 2018-02-24 DIAGNOSIS — E440 Moderate protein-calorie malnutrition: Secondary | ICD-10-CM | POA: Diagnosis not present

## 2018-02-24 DIAGNOSIS — D631 Anemia in chronic kidney disease: Secondary | ICD-10-CM | POA: Diagnosis not present

## 2018-02-24 DIAGNOSIS — I950 Idiopathic hypotension: Secondary | ICD-10-CM | POA: Diagnosis not present

## 2018-02-24 DIAGNOSIS — C9190 Lymphoid leukemia, unspecified not having achieved remission: Secondary | ICD-10-CM | POA: Diagnosis not present

## 2018-02-24 DIAGNOSIS — E1122 Type 2 diabetes mellitus with diabetic chronic kidney disease: Secondary | ICD-10-CM | POA: Diagnosis not present

## 2018-02-25 DIAGNOSIS — C9190 Lymphoid leukemia, unspecified not having achieved remission: Secondary | ICD-10-CM | POA: Diagnosis not present

## 2018-02-25 DIAGNOSIS — L89324 Pressure ulcer of left buttock, stage 4: Secondary | ICD-10-CM | POA: Diagnosis not present

## 2018-02-25 DIAGNOSIS — I129 Hypertensive chronic kidney disease with stage 1 through stage 4 chronic kidney disease, or unspecified chronic kidney disease: Secondary | ICD-10-CM | POA: Diagnosis not present

## 2018-02-25 DIAGNOSIS — M48061 Spinal stenosis, lumbar region without neurogenic claudication: Secondary | ICD-10-CM | POA: Diagnosis not present

## 2018-02-25 DIAGNOSIS — E1122 Type 2 diabetes mellitus with diabetic chronic kidney disease: Secondary | ICD-10-CM | POA: Diagnosis not present

## 2018-02-25 DIAGNOSIS — E440 Moderate protein-calorie malnutrition: Secondary | ICD-10-CM | POA: Diagnosis not present

## 2018-02-25 DIAGNOSIS — L03317 Cellulitis of buttock: Secondary | ICD-10-CM | POA: Diagnosis not present

## 2018-02-25 DIAGNOSIS — I950 Idiopathic hypotension: Secondary | ICD-10-CM | POA: Diagnosis not present

## 2018-02-25 DIAGNOSIS — D631 Anemia in chronic kidney disease: Secondary | ICD-10-CM | POA: Diagnosis not present

## 2018-02-25 DIAGNOSIS — N182 Chronic kidney disease, stage 2 (mild): Secondary | ICD-10-CM | POA: Diagnosis not present

## 2018-02-28 DIAGNOSIS — L03317 Cellulitis of buttock: Secondary | ICD-10-CM | POA: Diagnosis not present

## 2018-02-28 DIAGNOSIS — E440 Moderate protein-calorie malnutrition: Secondary | ICD-10-CM | POA: Diagnosis not present

## 2018-02-28 DIAGNOSIS — Z0000 Encounter for general adult medical examination without abnormal findings: Secondary | ICD-10-CM | POA: Diagnosis not present

## 2018-02-28 DIAGNOSIS — N3021 Other chronic cystitis with hematuria: Secondary | ICD-10-CM | POA: Diagnosis not present

## 2018-02-28 DIAGNOSIS — C9190 Lymphoid leukemia, unspecified not having achieved remission: Secondary | ICD-10-CM | POA: Diagnosis not present

## 2018-02-28 DIAGNOSIS — N182 Chronic kidney disease, stage 2 (mild): Secondary | ICD-10-CM | POA: Diagnosis not present

## 2018-02-28 DIAGNOSIS — D631 Anemia in chronic kidney disease: Secondary | ICD-10-CM | POA: Diagnosis not present

## 2018-02-28 DIAGNOSIS — I10 Essential (primary) hypertension: Secondary | ICD-10-CM | POA: Diagnosis not present

## 2018-02-28 DIAGNOSIS — I129 Hypertensive chronic kidney disease with stage 1 through stage 4 chronic kidney disease, or unspecified chronic kidney disease: Secondary | ICD-10-CM | POA: Diagnosis not present

## 2018-02-28 DIAGNOSIS — E1122 Type 2 diabetes mellitus with diabetic chronic kidney disease: Secondary | ICD-10-CM | POA: Diagnosis not present

## 2018-02-28 DIAGNOSIS — M48061 Spinal stenosis, lumbar region without neurogenic claudication: Secondary | ICD-10-CM | POA: Diagnosis not present

## 2018-02-28 DIAGNOSIS — I950 Idiopathic hypotension: Secondary | ICD-10-CM | POA: Diagnosis not present

## 2018-02-28 DIAGNOSIS — N390 Urinary tract infection, site not specified: Secondary | ICD-10-CM | POA: Diagnosis not present

## 2018-02-28 DIAGNOSIS — L89324 Pressure ulcer of left buttock, stage 4: Secondary | ICD-10-CM | POA: Diagnosis not present

## 2018-02-28 DIAGNOSIS — R269 Unspecified abnormalities of gait and mobility: Secondary | ICD-10-CM | POA: Diagnosis not present

## 2018-02-28 DIAGNOSIS — E118 Type 2 diabetes mellitus with unspecified complications: Secondary | ICD-10-CM | POA: Diagnosis not present

## 2018-02-28 DIAGNOSIS — Z79899 Other long term (current) drug therapy: Secondary | ICD-10-CM | POA: Diagnosis not present

## 2018-02-28 DIAGNOSIS — D509 Iron deficiency anemia, unspecified: Secondary | ICD-10-CM | POA: Diagnosis not present

## 2018-03-01 DIAGNOSIS — Z79899 Other long term (current) drug therapy: Secondary | ICD-10-CM | POA: Diagnosis not present

## 2018-03-01 DIAGNOSIS — M545 Low back pain: Secondary | ICD-10-CM | POA: Diagnosis not present

## 2018-03-01 DIAGNOSIS — Z79891 Long term (current) use of opiate analgesic: Secondary | ICD-10-CM | POA: Diagnosis not present

## 2018-03-01 DIAGNOSIS — G894 Chronic pain syndrome: Secondary | ICD-10-CM | POA: Diagnosis not present

## 2018-03-01 DIAGNOSIS — M5416 Radiculopathy, lumbar region: Secondary | ICD-10-CM | POA: Diagnosis not present

## 2018-03-02 DIAGNOSIS — E1122 Type 2 diabetes mellitus with diabetic chronic kidney disease: Secondary | ICD-10-CM | POA: Diagnosis not present

## 2018-03-02 DIAGNOSIS — C9190 Lymphoid leukemia, unspecified not having achieved remission: Secondary | ICD-10-CM | POA: Diagnosis not present

## 2018-03-02 DIAGNOSIS — D631 Anemia in chronic kidney disease: Secondary | ICD-10-CM | POA: Diagnosis not present

## 2018-03-02 DIAGNOSIS — I129 Hypertensive chronic kidney disease with stage 1 through stage 4 chronic kidney disease, or unspecified chronic kidney disease: Secondary | ICD-10-CM | POA: Diagnosis not present

## 2018-03-02 DIAGNOSIS — M48061 Spinal stenosis, lumbar region without neurogenic claudication: Secondary | ICD-10-CM | POA: Diagnosis not present

## 2018-03-02 DIAGNOSIS — I950 Idiopathic hypotension: Secondary | ICD-10-CM | POA: Diagnosis not present

## 2018-03-02 DIAGNOSIS — L89324 Pressure ulcer of left buttock, stage 4: Secondary | ICD-10-CM | POA: Diagnosis not present

## 2018-03-02 DIAGNOSIS — E440 Moderate protein-calorie malnutrition: Secondary | ICD-10-CM | POA: Diagnosis not present

## 2018-03-02 DIAGNOSIS — N182 Chronic kidney disease, stage 2 (mild): Secondary | ICD-10-CM | POA: Diagnosis not present

## 2018-03-02 DIAGNOSIS — L03317 Cellulitis of buttock: Secondary | ICD-10-CM | POA: Diagnosis not present

## 2018-03-04 DIAGNOSIS — L03317 Cellulitis of buttock: Secondary | ICD-10-CM | POA: Diagnosis not present

## 2018-03-04 DIAGNOSIS — I950 Idiopathic hypotension: Secondary | ICD-10-CM | POA: Diagnosis not present

## 2018-03-04 DIAGNOSIS — L89324 Pressure ulcer of left buttock, stage 4: Secondary | ICD-10-CM | POA: Diagnosis not present

## 2018-03-04 DIAGNOSIS — N182 Chronic kidney disease, stage 2 (mild): Secondary | ICD-10-CM | POA: Diagnosis not present

## 2018-03-04 DIAGNOSIS — M48061 Spinal stenosis, lumbar region without neurogenic claudication: Secondary | ICD-10-CM | POA: Diagnosis not present

## 2018-03-04 DIAGNOSIS — E1122 Type 2 diabetes mellitus with diabetic chronic kidney disease: Secondary | ICD-10-CM | POA: Diagnosis not present

## 2018-03-04 DIAGNOSIS — I129 Hypertensive chronic kidney disease with stage 1 through stage 4 chronic kidney disease, or unspecified chronic kidney disease: Secondary | ICD-10-CM | POA: Diagnosis not present

## 2018-03-04 DIAGNOSIS — C9190 Lymphoid leukemia, unspecified not having achieved remission: Secondary | ICD-10-CM | POA: Diagnosis not present

## 2018-03-04 DIAGNOSIS — E440 Moderate protein-calorie malnutrition: Secondary | ICD-10-CM | POA: Diagnosis not present

## 2018-03-04 DIAGNOSIS — D631 Anemia in chronic kidney disease: Secondary | ICD-10-CM | POA: Diagnosis not present

## 2018-03-10 ENCOUNTER — Other Ambulatory Visit: Payer: Self-pay | Admitting: Hematology and Oncology

## 2018-03-10 DIAGNOSIS — C9110 Chronic lymphocytic leukemia of B-cell type not having achieved remission: Secondary | ICD-10-CM

## 2018-03-11 ENCOUNTER — Inpatient Hospital Stay (HOSPITAL_BASED_OUTPATIENT_CLINIC_OR_DEPARTMENT_OTHER): Payer: Medicare HMO | Admitting: Hematology and Oncology

## 2018-03-11 ENCOUNTER — Encounter: Payer: Self-pay | Admitting: Hematology and Oncology

## 2018-03-11 ENCOUNTER — Telehealth: Payer: Self-pay | Admitting: Hematology and Oncology

## 2018-03-11 ENCOUNTER — Other Ambulatory Visit: Payer: Self-pay | Admitting: *Deleted

## 2018-03-11 ENCOUNTER — Inpatient Hospital Stay: Payer: Medicare HMO | Attending: Hematology and Oncology

## 2018-03-11 DIAGNOSIS — D649 Anemia, unspecified: Secondary | ICD-10-CM | POA: Insufficient documentation

## 2018-03-11 DIAGNOSIS — D630 Anemia in neoplastic disease: Secondary | ICD-10-CM

## 2018-03-11 DIAGNOSIS — Z79899 Other long term (current) drug therapy: Secondary | ICD-10-CM

## 2018-03-11 DIAGNOSIS — C9110 Chronic lymphocytic leukemia of B-cell type not having achieved remission: Secondary | ICD-10-CM

## 2018-03-11 LAB — CBC WITH DIFFERENTIAL/PLATELET
ABS IMMATURE GRANULOCYTES: 0 10*3/uL (ref 0.00–0.07)
Basophils Absolute: 0 10*3/uL (ref 0.0–0.1)
Basophils Relative: 0 %
EOS PCT: 1 %
Eosinophils Absolute: 0.1 10*3/uL (ref 0.0–0.5)
HCT: 32.3 % — ABNORMAL LOW (ref 39.0–52.0)
Hemoglobin: 9.9 g/dL — ABNORMAL LOW (ref 13.0–17.0)
LYMPHS ABS: 7.9 10*3/uL — AB (ref 0.7–4.0)
LYMPHS PCT: 70 %
MCH: 25.2 pg — ABNORMAL LOW (ref 26.0–34.0)
MCHC: 30.7 g/dL (ref 30.0–36.0)
MCV: 82.2 fL (ref 80.0–100.0)
MONO ABS: 0.3 10*3/uL (ref 0.1–1.0)
Monocytes Relative: 3 %
NEUTROS ABS: 2.9 10*3/uL (ref 1.7–17.7)
NRBC: 0 % (ref 0.0–0.2)
Neutrophils Relative %: 26 %
PLATELETS: 226 10*3/uL (ref 150–400)
RBC: 3.93 MIL/uL — ABNORMAL LOW (ref 4.22–5.81)
RDW: 16.3 % — ABNORMAL HIGH (ref 11.5–15.5)
WBC: 11.3 10*3/uL — ABNORMAL HIGH (ref 4.0–10.5)

## 2018-03-11 MED ORDER — IBRUTINIB 280 MG PO TABS: 280.0000 mg | ORAL_TABLET | Freq: Every day | ORAL | 0 refills | Status: DC

## 2018-03-11 NOTE — Progress Notes (Signed)
[place] OFFICE PROGRESS NOTE  Patient Care Team: Jani Gravel, MD as PCP - General (Internal Medicine) Heath Lark, MD as Consulting Physician (Hematology and Oncology)  ASSESSMENT & PLAN:  CLL (chronic lymphocytic leukemia) [facility]) The patient has finally recovered from pressure ulcer He feels well with good appetite He has mild leukocytosis on exam today, I can feel some lymphadenopathy, most consistent with recurrence of CLL We will resume ibrutinib He is reminded about side effects to watch out for I plan to see him back within a week of starting of treatment He is recommended to start treatment on 03/14/2018  Anemia in neoplastic disease Anemia is improving He is not symptomatic Observe only   Orders Placed This Encounter  Procedures  . Comprehensive metabolic panel    Standing Status:   Standing    Number of Occurrences:   22    Standing Expiration Date:   03/12/2019  . CBC with Differential/Platelet    Standing Status:   Standing    Number of Occurrences:   22    Standing Expiration Date:   03/12/2019  . Uric acid    Standing Status:   Standing    Number of Occurrences:   3    Standing Expiration Date:   03/12/2019    INTERVAL HISTORY: Please see below for problem oriented charting. He returns for further follow-up His pressure ulcer has finally healed He feels well No recent infection, fever or chills No new lymphadenopathy SUMMARY OF ONCOLOGIC HISTORY:   CLL (chronic lymphocytic leukemia) ([place])   04/03/2008 Pathology Results    Case #: [id number] FLow cytomtry confirmed CLL    10/11/2014 Tumor Marker    FISH analysis showed trisomy 12 abnormality    04/29/2015 -  Chemotherapy    He started taking Ibrutinib    04/29/2015 Imaging    Ct scan showed mild lymphadenopathy and splenomegaly    05/13/2015 Adverse Reaction    Due to neutropenia, dose of Ibrutinib is reduced to 280 mg daily    01/08/2016 Imaging    Response to therapy. Resolution of  thoracic and abdominal adenopathy. Resolution of splenomegaly. 2. No new or progressive disease. 3. Small bowel containing left inguinal hernia and right-sided hydrocele, as before. 4. Esophageal air fluid level suggests dysmotility or gastroesophageal reflux. 5. Possible constipation. 6. Suspect hepatic hemangiomas, similar.    10/09/2016 Imaging    No findings suspicious for active lymphomatous involvement.  No abdominopelvic lymphadenopathy. Spleen is normal in size.  Moderate to large left inguinal/scrotal hernia containing small bowel and fat. No evidence of bowel obstruction.  Additional stable ancillary findings as above.    04/26/2017 Imaging    1. No lymphadenopathy in the chest, abdomen, or pelvis. 2. Mild circumferential bladder wall thickening. Incomplete distention may contribute to this appearance, but bladder infection is also consideration. 3. Stable appearance small hepatic lesions. Imaging features most suggestive of benign cavernous hemangiomas. 4. Stable tiny low-density renal lesions compatible with cysts. 5. Large left groin hernia contains numerous small bowel loops without complicating features. 6. Right-sided hydrocele.    11/15/2017 Imaging    Large recurrent left inguinal hernia with extension of multiple small bowel loops into the inguinal canal and scrotum on the left. Proximal small bowel dilatation is noted consistent with a degree of incarceration.  Bilateral small effusions as well as patchy bibasilar infiltrates.     12/14/2017 Adverse Reaction    Ibrutinib placed on hold     REVIEW OF SYSTEMS:  Constitutional: Denies fevers, chills or abnormal weight loss Eyes: Denies blurriness of vision Ears, nose, mouth, throat, and face: Denies mucositis or sore throat Respiratory: Denies cough, dyspnea or wheezes Cardiovascular: Denies palpitation, chest discomfort or lower extremity swelling Gastrointestinal:  Denies nausea, heartburn or change in bowel  habits Skin: Denies abnormal skin rashes Lymphatics: Denies new lymphadenopathy or easy bruising Neurological:Denies numbness, tingling or new weaknesses Behavioral/Psych: Mood is stable, no new changes  All other systems were reviewed with the patient and are negative.  I have reviewed the past medical history, past surgical history, social history and family history with the patient and they are unchanged from previous note.  ALLERGIES:  has No Known Allergies.  MEDICATIONS:  Current Outpatient Medications  Medication Sig Dispense Refill  . acetaminophen (TYLENOL) 650 MG CR tablet Take 1 tablet (650 mg total) by mouth 2 (two) times daily as needed for pain. 30 tablet 0  . aspirin EC 81 MG tablet Take 81 mg by mouth daily.    . Capsaicin (ASPERCREME PAIN RELIEF PATCH EX) Apply 1 patch topically daily. Apply 1 patch to left shoulder 12 hrs/day. On at 8 am, off at 8 pm    . Cholecalciferol (VITAMIN D3) 1000 units CAPS Take 1,000 Units by mouth 2 (two) times daily.     . collagenase (SANTYL) ointment Apply 1 application topically daily.    . Cyanocobalamin (VITAMIN B 12 PO) Take 1,000 mcg by mouth daily.    Marland Kitchen gabapentin (NEURONTIN) 100 MG capsule Take 100 mg by mouth 3 (three) times daily.    . Ibrutinib 280 MG TABS Take 280 mg by mouth daily. 30 tablet 0  . insulin NPH-regular Human (70-30) 100 UNIT/ML injection Inject 15 Units into the skin 2 (two) times daily with a meal.    . mirtazapine (REMERON) 15 MG tablet Take 1 tablet (15 mg total) by mouth at bedtime.    . Multiple Vitamin (MULTIVITAMIN WITH MINERALS) TABS tablet Take 1 tablet by mouth daily.    . Nutritional Supplements (GLUCERNA 1.0 CAL) LIQD Take 30 Cans by mouth 2 (two) times daily.    Marland Kitchen oxyCODONE (OXY IR/ROXICODONE) 5 MG immediate release tablet Take 1 tablet (5 mg total) by mouth every 6 (six) hours as needed for moderate pain, severe pain or breakthrough pain. 5 tablet 0  . Polyethyl Glycol-Propyl Glycol (SYSTANE OP) Place  1 drop into both eyes 2 (two) times daily.    . polyethylene glycol (MIRALAX / GLYCOLAX) packet Take 17 g by mouth daily. 14 each 0  . tamsulosin (FLOMAX) 0.4 MG CAPS capsule Take 0.4 mg by mouth daily.     No current facility-administered medications for this visit.     PHYSICAL EXAMINATION: ECOG PERFORMANCE STATUS: 0 - Asymptomatic  Vitals:   03/11/18 1020  BP: (!) 127/55  Pulse: 70  Resp: 16  Temp: 97.6 F (36.4 C)  SpO2: 100%   Filed Weights   03/11/18 1020  Weight: 182 lb 6.4 oz (82.7 kg)    GENERAL:alert, no distress and comfortable SKIN: skin color, texture, turgor are normal, no rashes or significant lesions EYES: normal, Conjunctiva are pink and non-injected, sclera clear OROPHARYNX:no exudate, no erythema and lips, buccal mucosa, and tongue normal  NECK: supple, thyroid normal size, non-tender, without nodularity LYMPH: He has palpable lymphadenopathy in the cervical region and axillary region LUNGS: clear to auscultation and percussion with normal breathing effort HEART: regular rate & rhythm and no murmurs and no lower extremity edema ABDOMEN:abdomen soft, non-tender  and normal bowel sounds Musculoskeletal:no cyanosis of digits and no clubbing  NEURO: alert & oriented x 3 with fluent speech, no focal motor/sensory deficits  LABORATORY DATA:  I have reviewed the data as listed    Component Value Date/Time   NA 139 02/08/2018 1059   NA 140 10/09/2016 1106   K 4.7 02/08/2018 1059   K 4.7 10/09/2016 1106   CL 104 02/08/2018 1059   CL 104 03/28/2012 1426   CO2 27 02/08/2018 1059   CO2 28 10/09/2016 1106   GLUCOSE 117 (H) 02/08/2018 1059   GLUCOSE 127 10/09/2016 1106   GLUCOSE 171 (H) 03/28/2012 1426   BUN 21 02/08/2018 1059   BUN 20.8 10/09/2016 1106   CREATININE 0.93 02/08/2018 1059   CREATININE 1.1 10/09/2016 1106   CALCIUM 8.8 (L) 02/08/2018 1059   CALCIUM 9.1 10/09/2016 1106   PROT 6.0 (L) 02/08/2018 1059   PROT 5.8 (L) 10/09/2016 1106   ALBUMIN  3.2 (L) 02/08/2018 1059   ALBUMIN 3.1 (L) 10/09/2016 1106   AST 14 (L) 02/08/2018 1059   AST 17 10/09/2016 1106   ALT 8 02/08/2018 1059   ALT 8 10/09/2016 1106   ALKPHOS 57 02/08/2018 1059   ALKPHOS 46 10/09/2016 1106   BILITOT 0.3 02/08/2018 1059   BILITOT 0.23 10/09/2016 1106   GFRNONAA >60 02/08/2018 1059   GFRAA >60 02/08/2018 1059    No results found for: SPEP, UPEP  Lab Results  Component Value Date   WBC 11.3 (H) 03/11/2018   NEUTROABS 2.9 03/11/2018   HGB 9.9 (L) 03/11/2018   HCT 32.3 (L) 03/11/2018   MCV 82.2 03/11/2018   PLT 226 03/11/2018      Chemistry      Component Value Date/Time   NA 139 02/08/2018 1059   NA 140 10/09/2016 1106   K 4.7 02/08/2018 1059   K 4.7 10/09/2016 1106   CL 104 02/08/2018 1059   CL 104 03/28/2012 1426   CO2 27 02/08/2018 1059   CO2 28 10/09/2016 1106   BUN 21 02/08/2018 1059   BUN 20.8 10/09/2016 1106   CREATININE 0.93 02/08/2018 1059   CREATININE 1.1 10/09/2016 1106      Component Value Date/Time   CALCIUM 8.8 (L) 02/08/2018 1059   CALCIUM 9.1 10/09/2016 1106   ALKPHOS 57 02/08/2018 1059   ALKPHOS 46 10/09/2016 1106   AST 14 (L) 02/08/2018 1059   AST 17 10/09/2016 1106   ALT 8 02/08/2018 1059   ALT 8 10/09/2016 1106   BILITOT 0.3 02/08/2018 1059   BILITOT 0.23 10/09/2016 1106       All questions were answered. The patient knows to call the clinic with any problems, questions or concerns. No barriers to learning was detected.  I spent 10 minutes counseling the patient face to face. The total time spent in the appointment was 15 minutes and more than 50% was on counseling and review of test results  Heath Lark, MD 03/11/2018 4:27 PM

## 2018-03-11 NOTE — Telephone Encounter (Signed)
Gave avs and calendar ° °

## 2018-03-11 NOTE — Assessment & Plan Note (Signed)
Anemia is improving He is not symptomatic Observe only

## 2018-03-11 NOTE — Assessment & Plan Note (Addendum)
The patient has finally recovered from pressure ulcer He feels well with good appetite He has mild leukocytosis on exam today, I can feel some lymphadenopathy, most consistent with recurrence of CLL We will resume ibrutinib He is reminded about side effects to watch out for I plan to see him back within a week of starting of treatment He is recommended to start treatment on 03/14/2018

## 2018-03-14 ENCOUNTER — Telehealth: Payer: Self-pay | Admitting: Pharmacist

## 2018-03-14 NOTE — Telephone Encounter (Signed)
Oral Chemotherapy Pharmacist Encounter   I spoke with patient and daughter, [name], for restart of: Imbruvica (ibrutinib) for the treatment of chronic lymphocytic leukemia, planned duration until disease progression or unacceptable toxicity.   Counseled patient on administration, dosing, side effects, monitoring, drug-food interactions, safe handling, storage, and disposal.  Patient will take Imbruvica 280mg  tablets, 1 tablet (280 mg) by mouth once daily.  Patient still has a 1 month supply of Imbruvica 140 mg tablets at home. He will take Imbruvica 140 mg tablets, 2 tablets (280 mg) by mouth once daily. He will finish out his supply of Imbruvica capsules and then start on Imbruvica tablets after capsule supply is depleted.  Patient will continue taking Imbruvica at approximately the same time each day with a full glass of water and maintain adequate hydration throughout the day.  Patient knows to avoid grapefruit or grapefruit juice while on therapy with Imbruvica.  Imbruvica start date: 03/14/2018  Adverse effects include but are not limited to: bruising, decreased blood counts, N/V, diarrhea, musculoskeletal pain, arthralgias, peripheral edema, and hemorrhage.    Patient states he did experience some diarrhea with initiation of Imbruvica previously. Patient states he has antidiarrheal medication at the house and will contact the office   of 4 or more loose stools above baseline.  Reviewed with patient importance of keeping a medication schedule and plan for any missed doses.  Michael [name] voiced understanding and appreciation.   All questions answered. Medication reconciliation performed and medication/allergy list updated.  Patient with a foundation copayment grant that remains active until the end of February 2020. In order to have activity on this grant Imbruvica 280 mg tablets will ship from the [place] [facility] outpatient pharmacy tomorrow for delivery to patient's home on  Wednesday, 03/16/2018.  Patient knows to call the office with questions or concerns. Oral Oncology Clinic will continue to follow.  Johny Drilling, PharmD, BCPS, BCOP  03/14/2018   3:59 PM Oral Oncology Clinic [phone]

## 2018-03-14 NOTE — Telephone Encounter (Signed)
Oral Oncology Pharmacist Encounter  Received notification of restart prescription for Imbruvica (ibrutinib) for the treatment of chronic lymphocytic leukemia, planned duration until disease progression or unacceptable toxicity.  Original start date: 04/29/2015 Michael [name] has been held since Oct or Nov 2019 with multiple hospitalization following hernia repair, persistent decubitus ulcer, and failure to thrive.  Patient now received from previous issues and is under evaluation to restart treatment for CLL with Imbruvica dose at 280mg  once daily. New prescription has been sent to the pharmacy.  Labs from Epic assessed, [place] for treatment. BPs in Epic reviewed, readings WNL, will continue to be monitored  Current medication list in Epic reviewed, moderate DDI with Imbruvica and aspirin identified:  Category C interaction: Ibrutinib may enhance the adverse/toxic effect of agents with antiplatelet properties.  Manufacturer recommends to consider the benefit to risk ratio of ibrutinib in patients requiring therapy with antiplatelet agents, monitor for signs and symptoms of bleeding in patients receiving concomitant therapy.  I will verify with patient he remains on aspirin 81 mg once daily.  I will verify with MD that patient should be on both medications.  Prescription has been e-scribed to the [facility] for benefits analysis and approval.  Oral Oncology Clinic will continue to follow for insurance authorization, copayment issues, initial counseling and start date.  Johny Drilling, PharmD, BCPS, BCOP  03/14/2018 11:31 AM Oral Oncology Clinic [phone]

## 2018-03-15 DIAGNOSIS — R69 Illness, unspecified: Secondary | ICD-10-CM | POA: Diagnosis not present

## 2018-03-15 MED FILL — IMBRUVICA 280 MG TAB: 280 | 28 days supply | Qty: 28 | Fill #0

## 2018-03-16 NOTE — Telephone Encounter (Signed)
Oral Oncology [name] Encounter  Confirmed with [place] that Michael [name] was shipped on 03/15/18 with a $0 copay using [employer] grant.   Michael [name] [place] Phone [phone] Fax [phone]

## 2018-03-17 DIAGNOSIS — I950 Idiopathic hypotension: Secondary | ICD-10-CM | POA: Diagnosis not present

## 2018-03-17 DIAGNOSIS — L89324 Pressure ulcer of left buttock, stage 4: Secondary | ICD-10-CM | POA: Diagnosis not present

## 2018-03-17 DIAGNOSIS — I129 Hypertensive chronic kidney disease with stage 1 through stage 4 chronic kidney disease, or unspecified chronic kidney disease: Secondary | ICD-10-CM | POA: Diagnosis not present

## 2018-03-17 DIAGNOSIS — E1122 Type 2 diabetes mellitus with diabetic chronic kidney disease: Secondary | ICD-10-CM | POA: Diagnosis not present

## 2018-03-17 DIAGNOSIS — N182 Chronic kidney disease, stage 2 (mild): Secondary | ICD-10-CM | POA: Diagnosis not present

## 2018-03-17 DIAGNOSIS — D631 Anemia in chronic kidney disease: Secondary | ICD-10-CM | POA: Diagnosis not present

## 2018-03-17 DIAGNOSIS — L03317 Cellulitis of buttock: Secondary | ICD-10-CM | POA: Diagnosis not present

## 2018-03-17 DIAGNOSIS — C9190 Lymphoid leukemia, unspecified not having achieved remission: Secondary | ICD-10-CM | POA: Diagnosis not present

## 2018-03-17 DIAGNOSIS — M48061 Spinal stenosis, lumbar region without neurogenic claudication: Secondary | ICD-10-CM | POA: Diagnosis not present

## 2018-03-17 DIAGNOSIS — E440 Moderate protein-calorie malnutrition: Secondary | ICD-10-CM | POA: Diagnosis not present

## 2018-03-21 ENCOUNTER — Inpatient Hospital Stay: Payer: Medicare HMO | Admitting: Hematology and Oncology

## 2018-03-21 ENCOUNTER — Telehealth: Payer: Self-pay | Admitting: Hematology and Oncology

## 2018-03-21 ENCOUNTER — Encounter: Payer: Self-pay | Admitting: Hematology and Oncology

## 2018-03-21 ENCOUNTER — Inpatient Hospital Stay: Payer: Medicare HMO

## 2018-03-21 DIAGNOSIS — D630 Anemia in neoplastic disease: Secondary | ICD-10-CM

## 2018-03-21 DIAGNOSIS — K521 Toxic gastroenteritis and colitis: Secondary | ICD-10-CM

## 2018-03-21 DIAGNOSIS — C9110 Chronic lymphocytic leukemia of B-cell type not having achieved remission: Secondary | ICD-10-CM

## 2018-03-21 DIAGNOSIS — D649 Anemia, unspecified: Secondary | ICD-10-CM

## 2018-03-21 DIAGNOSIS — Z79899 Other long term (current) drug therapy: Secondary | ICD-10-CM | POA: Diagnosis not present

## 2018-03-21 LAB — COMPREHENSIVE METABOLIC PANEL
ALK PHOS: 43 U/L (ref 38–126)
ALT: 7 U/L (ref 0–44)
ANION GAP: 9 (ref 5–15)
AST: 14 U/L — ABNORMAL LOW (ref 15–41)
Albumin: 3.4 g/dL — ABNORMAL LOW (ref 3.5–5.0)
BILIRUBIN TOTAL: 0.3 mg/dL (ref 0.3–1.2)
BUN: 24 mg/dL — ABNORMAL HIGH (ref 8–23)
CALCIUM: 8.7 mg/dL — AB (ref 8.9–10.3)
CHLORIDE: 107 mmol/L (ref 98–111)
CO2: 22 mmol/L (ref 22–32)
CREATININE: 1.04 mg/dL (ref 0.61–1.24)
GFR calc non Af Amer: >60 mL/min (ref >60)
Glucose, Bld: 120 mg/dL — ABNORMAL HIGH (ref 70–99)
Potassium: 4.2 mmol/L (ref 3.5–5.1)
Sodium: 138 mmol/L (ref 135–145)
Total Protein: 6.3 g/dL — ABNORMAL LOW (ref 6.5–8.1)

## 2018-03-21 LAB — CBC WITH DIFFERENTIAL/PLATELET
ABS IMMATURE GRANULOCYTES: 0.01 10*3/uL (ref 0.00–0.07)
Basophils Absolute: 0 10*3/uL (ref 0.0–0.1)
Basophils Relative: 0 %
EOS ABS: 0.1 10*3/uL (ref 0.0–0.5)
EOS PCT: 1 %
HEMATOCRIT: 32.3 % — AB (ref 39.0–52.0)
Hemoglobin: 9.8 g/dL — ABNORMAL LOW (ref 13.0–17.0)
Immature Granulocytes: 0 %
LYMPHS ABS: 5.5 10*3/uL — AB (ref 0.7–4.0)
LYMPHS PCT: 68 %
MCH: 24.6 pg — AB (ref 26.0–34.0)
MCHC: 30.3 g/dL (ref 30.0–36.0)
MCV: 81.2 fL (ref 80.0–100.0)
MONO ABS: 0.2 10*3/uL (ref 0.1–1.0)
Monocytes Relative: 3 %
Neutro Abs: 2.3 10*3/uL (ref 1.7–7.7)
Neutrophils Relative %: 28 %
Platelets: 235 10*3/uL (ref 150–400)
RBC: 3.98 MIL/uL — ABNORMAL LOW (ref 4.22–5.81)
RDW: 15.9 % — ABNORMAL HIGH (ref 11.5–15.5)
WBC: 8.1 10*3/uL (ref 4.0–10.5)
nRBC: 0 % (ref 0.0–0.2)

## 2018-03-21 LAB — URIC ACID: Uric Acid, Serum: 8.4 mg/dL (ref 3.7–8.6)

## 2018-03-21 NOTE — Assessment & Plan Note (Signed)
He has resumed taking ibrutinib, complicated by mild diarrhea We will continue treatment I will see him again next week for further follow-up

## 2018-03-21 NOTE — Assessment & Plan Note (Signed)
Anemia is stable He is not symptomatic Observe only

## 2018-03-21 NOTE — Assessment & Plan Note (Signed)
He has recurrent diarrhea, self-limiting, well managed with over-the-counter antidiarrhea medicine I recommend he continue the same.  Clinically, he does not appear dehydrated

## 2018-03-21 NOTE — Progress Notes (Signed)
[place] OFFICE PROGRESS NOTE  Patient Care Team: Jani Gravel, MD as PCP - General (Internal Medicine) Heath Lark, MD as Consulting Physician (Hematology and Oncology)  ASSESSMENT & PLAN:  CLL (chronic lymphocytic leukemia) ([place]) He has resumed taking ibrutinib, complicated by mild diarrhea We will continue treatment I will see him again next week for further follow-up  Diarrhea due to drug He has recurrent diarrhea, self-limiting, well managed with over-the-counter antidiarrhea medicine I recommend he continue the same.  Clinically, he does not appear dehydrated  Anemia in neoplastic disease Anemia is stable He is not symptomatic Observe only   No orders of the defined types were placed in this encounter.   INTERVAL HISTORY: Please see below for problem oriented charting. He returns for further follow-up He has resumed taking ibrutinib last week Since then, he has frequent intermittent diarrhea He takes Pepto-Bismol over-the-counter and that seems to help.  He also have Imodium to take as needed Denies recent infection, fever or chills No recent bruising or bleeding Denies abnormal heart palpitation  SUMMARY OF ONCOLOGIC HISTORY:   CLL (chronic lymphocytic leukemia) ([place])   04/03/2008 Pathology Results    Case #: [id number] FLow cytomtry confirmed CLL    10/11/2014 Tumor Marker    FISH analysis showed trisomy 12 abnormality    04/29/2015 -  Chemotherapy    He started taking Ibrutinib    04/29/2015 Imaging    Ct scan showed mild lymphadenopathy and splenomegaly    05/13/2015 Adverse Reaction    Due to neutropenia, dose of Ibrutinib is reduced to 280 mg daily    01/08/2016 Imaging    Response to therapy. Resolution of thoracic and abdominal adenopathy. Resolution of splenomegaly. 2. No new or progressive disease. 3. Small bowel containing left inguinal hernia and right-sided hydrocele, as before. 4. Esophageal air fluid level suggests dysmotility or  gastroesophageal reflux. 5. Possible constipation. 6. Suspect hepatic hemangiomas, similar.    10/09/2016 Imaging    No findings suspicious for active lymphomatous involvement.  No abdominopelvic lymphadenopathy. Spleen is normal in size.  Moderate to large left inguinal/scrotal hernia containing small bowel and fat. No evidence of bowel obstruction.  Additional stable ancillary findings as above.    04/26/2017 Imaging    1. No lymphadenopathy in the chest, abdomen, or pelvis. 2. Mild circumferential bladder wall thickening. Incomplete distention may contribute to this appearance, but bladder infection is also consideration. 3. Stable appearance small hepatic lesions. Imaging features most suggestive of benign cavernous hemangiomas. 4. Stable tiny low-density renal lesions compatible with cysts. 5. Large left groin hernia contains numerous small bowel loops without complicating features. 6. Right-sided hydrocele.    11/15/2017 Imaging    Large recurrent left inguinal hernia with extension of multiple small bowel loops into the inguinal canal and scrotum on the left. Proximal small bowel dilatation is noted consistent with a degree of incarceration.  Bilateral small effusions as well as patchy bibasilar infiltrates.     12/14/2017 Adverse Reaction    Ibrutinib placed on hold     REVIEW OF SYSTEMS:   Constitutional: Denies fevers, chills or abnormal weight loss Eyes: Denies blurriness of vision Ears, nose, mouth, throat, and face: Denies mucositis or sore throat Respiratory: Denies cough, dyspnea or wheezes Cardiovascular: Denies palpitation, chest discomfort or lower extremity swelling Skin: Denies abnormal skin rashes Lymphatics: Denies new lymphadenopathy or easy bruising Neurological:Denies numbness, tingling or new weaknesses Behavioral/Psych: Mood is stable, no new changes  All other systems were reviewed with the patient  and are negative.  I have reviewed the past  medical history, past surgical history, social history and family history with the patient and they are unchanged from previous note.  ALLERGIES:  has No Known Allergies.  MEDICATIONS:  Current Outpatient Medications  Medication Sig Dispense Refill  . acetaminophen (TYLENOL) 650 MG CR tablet Take 1 tablet (650 mg total) by mouth 2 (two) times daily as needed for pain. 30 tablet 0  . aspirin EC 81 MG tablet Take 81 mg by mouth daily.    . Capsaicin (ASPERCREME PAIN RELIEF PATCH EX) Apply 1 patch topically daily. Apply 1 patch to left shoulder 12 hrs/day. On at 8 am, off at 8 pm    . Cholecalciferol (VITAMIN D3) 1000 units CAPS Take 1,000 Units by mouth 2 (two) times daily.     . collagenase (SANTYL) ointment Apply 1 application topically daily.    . Cyanocobalamin (VITAMIN B 12 PO) Take 1,000 mcg by mouth daily.    Marland Kitchen gabapentin (NEURONTIN) 100 MG capsule Take 100 mg by mouth 3 (three) times daily.    . Ibrutinib 280 MG TABS Take 280 mg by mouth daily. 30 tablet 0  . insulin NPH-regular Human (70-30) 100 UNIT/ML injection Inject 15 Units into the skin 2 (two) times daily with a meal.    . mirtazapine (REMERON) 15 MG tablet Take 1 tablet (15 mg total) by mouth at bedtime.    . Multiple Vitamin (MULTIVITAMIN WITH MINERALS) TABS tablet Take 1 tablet by mouth daily.    . Nutritional Supplements (GLUCERNA 1.0 CAL) LIQD Take 30 Cans by mouth 2 (two) times daily.    Marland Kitchen oxyCODONE (OXY IR/ROXICODONE) 5 MG immediate release tablet Take 1 tablet (5 mg total) by mouth every 6 (six) hours as needed for moderate pain, severe pain or breakthrough pain. 5 tablet 0  . Polyethyl Glycol-Propyl Glycol (SYSTANE OP) Place 1 drop into both eyes 2 (two) times daily.    . polyethylene glycol (MIRALAX / GLYCOLAX) packet Take 17 g by mouth daily. 14 each 0  . tamsulosin (FLOMAX) 0.4 MG CAPS capsule Take 0.4 mg by mouth daily.     No current facility-administered medications for this visit.     PHYSICAL  EXAMINATION: ECOG PERFORMANCE STATUS: 1 - Symptomatic but completely ambulatory  Vitals:   03/21/18 1203  BP: 139/63  Pulse: 94  Resp: 18  Temp: 98.1 F (36.7 C)  SpO2: 100%   Filed Weights   03/21/18 1203  Weight: 180 lb 12.8 oz (82 kg)    GENERAL:alert, no distress and comfortable SKIN: skin color, texture, turgor are normal, no rashes or significant lesions EYES: normal, Conjunctiva are pink and non-injected, sclera clear OROPHARYNX:no exudate, no erythema and lips, buccal mucosa, and tongue normal  NECK: supple, thyroid normal size, non-tender, without nodularity LYMPH:  no palpable lymphadenopathy in the cervical, axillary or inguinal LUNGS: clear to auscultation and percussion with normal breathing effort HEART: regular rate & rhythm and no murmurs and no lower extremity edema ABDOMEN:abdomen soft, non-tender and normal bowel sounds Musculoskeletal:no cyanosis of digits and no clubbing  NEURO: alert & oriented x 3 with fluent speech, no focal motor/sensory deficits  LABORATORY DATA:  I have reviewed the data as listed    Component Value Date/Time   NA 139 02/08/2018 1059   NA 140 10/09/2016 1106   K 4.7 02/08/2018 1059   K 4.7 10/09/2016 1106   CL 104 02/08/2018 1059   CL 104 03/28/2012 1426   CO2 27  02/08/2018 1059   CO2 28 10/09/2016 1106   GLUCOSE 117 (H) 02/08/2018 1059   GLUCOSE 127 10/09/2016 1106   GLUCOSE 171 (H) 03/28/2012 1426   BUN 21 02/08/2018 1059   BUN 20.8 10/09/2016 1106   CREATININE 0.93 02/08/2018 1059   CREATININE 1.1 10/09/2016 1106   CALCIUM 8.8 (L) 02/08/2018 1059   CALCIUM 9.1 10/09/2016 1106   PROT 6.0 (L) 02/08/2018 1059   PROT 5.8 (L) 10/09/2016 1106   ALBUMIN 3.2 (L) 02/08/2018 1059   ALBUMIN 3.1 (L) 10/09/2016 1106   AST 14 (L) 02/08/2018 1059   AST 17 10/09/2016 1106   ALT 8 02/08/2018 1059   ALT 8 10/09/2016 1106   ALKPHOS 57 02/08/2018 1059   ALKPHOS 46 10/09/2016 1106   BILITOT 0.3 02/08/2018 1059   BILITOT 0.23  10/09/2016 1106   GFRNONAA >60 02/08/2018 1059   GFRAA >60 02/08/2018 1059    No results found for: SPEP, UPEP  Lab Results  Component Value Date   WBC 8.1 03/21/2018   NEUTROABS 2.3 03/21/2018   HGB 9.8 (L) 03/21/2018   HCT 32.3 (L) 03/21/2018   MCV 81.2 03/21/2018   PLT 235 03/21/2018      Chemistry      Component Value Date/Time   NA 139 02/08/2018 1059   NA 140 10/09/2016 1106   K 4.7 02/08/2018 1059   K 4.7 10/09/2016 1106   CL 104 02/08/2018 1059   CL 104 03/28/2012 1426   CO2 27 02/08/2018 1059   CO2 28 10/09/2016 1106   BUN 21 02/08/2018 1059   BUN 20.8 10/09/2016 1106   CREATININE 0.93 02/08/2018 1059   CREATININE 1.1 10/09/2016 1106      Component Value Date/Time   CALCIUM 8.8 (L) 02/08/2018 1059   CALCIUM 9.1 10/09/2016 1106   ALKPHOS 57 02/08/2018 1059   ALKPHOS 46 10/09/2016 1106   AST 14 (L) 02/08/2018 1059   AST 17 10/09/2016 1106   ALT 8 02/08/2018 1059   ALT 8 10/09/2016 1106   BILITOT 0.3 02/08/2018 1059   BILITOT 0.23 10/09/2016 1106      All questions were answered. The patient knows to call the clinic with any problems, questions or concerns. No barriers to learning was detected.  I spent 15 minutes counseling the patient face to face. The total time spent in the appointment was 20 minutes and more than 50% was on counseling and review of test results  Heath Lark, MD 03/21/2018 12:21 PM

## 2018-03-21 NOTE — Telephone Encounter (Signed)
Gave avs and calendar ° °

## 2018-03-25 DIAGNOSIS — L89309 Pressure ulcer of unspecified buttock, unspecified stage: Secondary | ICD-10-CM | POA: Diagnosis not present

## 2018-03-30 DIAGNOSIS — Z79899 Other long term (current) drug therapy: Secondary | ICD-10-CM | POA: Diagnosis not present

## 2018-03-30 DIAGNOSIS — C9110 Chronic lymphocytic leukemia of B-cell type not having achieved remission: Secondary | ICD-10-CM | POA: Diagnosis present

## 2018-03-30 DIAGNOSIS — D649 Anemia, unspecified: Secondary | ICD-10-CM | POA: Diagnosis not present

## 2018-03-31 ENCOUNTER — Inpatient Hospital Stay: Payer: Medicare HMO | Attending: Hematology and Oncology | Admitting: Hematology and Oncology

## 2018-03-31 ENCOUNTER — Encounter: Payer: Self-pay | Admitting: Hematology and Oncology

## 2018-03-31 ENCOUNTER — Inpatient Hospital Stay: Payer: Medicare HMO

## 2018-03-31 DIAGNOSIS — C9110 Chronic lymphocytic leukemia of B-cell type not having achieved remission: Secondary | ICD-10-CM

## 2018-03-31 DIAGNOSIS — K521 Toxic gastroenteritis and colitis: Secondary | ICD-10-CM

## 2018-03-31 DIAGNOSIS — Z79899 Other long term (current) drug therapy: Secondary | ICD-10-CM

## 2018-03-31 DIAGNOSIS — D649 Anemia, unspecified: Secondary | ICD-10-CM | POA: Diagnosis not present

## 2018-03-31 DIAGNOSIS — D630 Anemia in neoplastic disease: Secondary | ICD-10-CM | POA: Diagnosis not present

## 2018-03-31 LAB — COMPREHENSIVE METABOLIC PANEL
ALBUMIN: 3.3 g/dL — AB (ref 3.5–5.0)
ALK PHOS: 42 U/L (ref 38–126)
ALT: 6 U/L (ref 0–44)
AST: 12 U/L — AB (ref 15–41)
Anion gap: 8 (ref 5–15)
BILIRUBIN TOTAL: 0.3 mg/dL (ref 0.3–1.2)
BUN: 27 mg/dL — AB (ref 8–23)
CO2: 22 mmol/L (ref 22–32)
Calcium: 8.7 mg/dL — ABNORMAL LOW (ref 8.9–10.3)
Chloride: 107 mmol/L (ref 98–111)
Creatinine, Ser: 1.01 mg/dL (ref 0.61–1.24)
GFR calc Af Amer: >60 mL/min (ref >60)
GFR calc non Af Amer: >60 mL/min (ref >60)
GLUCOSE: 122 mg/dL — AB (ref 70–99)
POTASSIUM: 4.7 mmol/L (ref 3.5–5.1)
Sodium: 137 mmol/L (ref 135–145)
TOTAL PROTEIN: 6.2 g/dL — AB (ref 6.5–8.1)

## 2018-03-31 LAB — CBC WITH DIFFERENTIAL/PLATELET
ABS IMMATURE GRANULOCYTES: 0.02 10*3/uL (ref 0.00–0.07)
Basophils Absolute: 0 10*3/uL (ref 0.0–0.1)
Basophils Relative: 1 %
Eosinophils Absolute: 0.1 10*3/uL (ref 0.0–0.5)
Eosinophils Relative: 1 %
HEMATOCRIT: 32.1 % — AB (ref 39.0–52.0)
HEMOGLOBIN: 10.1 g/dL — AB (ref 13.0–17.0)
IMMATURE GRANULOCYTES: 0 %
LYMPHS ABS: 4.6 10*3/uL — AB (ref 0.7–4.0)
Lymphocytes Relative: 53 %
MCH: 24.9 pg — ABNORMAL LOW (ref 26.0–34.0)
MCHC: 31.5 g/dL (ref 30.0–36.0)
MCV: 79.3 fL — ABNORMAL LOW (ref 80.0–100.0)
MONO ABS: 0.4 10*3/uL (ref 0.1–1.0)
Monocytes Relative: 4 %
NEUTROS ABS: 3.6 10*3/uL (ref 1.7–7.7)
Neutrophils Relative %: 41 %
Platelets: 253 10*3/uL (ref 150–400)
RBC: 4.05 MIL/uL — ABNORMAL LOW (ref 4.22–5.81)
RDW: 16.3 % — AB (ref 11.5–15.5)
WBC: 8.7 10*3/uL (ref 4.0–10.5)
nRBC: 0 % (ref 0.0–0.2)

## 2018-03-31 LAB — URIC ACID: URIC ACID, SERUM: 7.8 mg/dL (ref 3.7–8.6)

## 2018-03-31 NOTE — Assessment & Plan Note (Signed)
He tolerated treatment very well Lymphocytosis is improving His hemoglobin is improving We will continue treatment without dose adjustment I will see him back in the month for further follow-up

## 2018-03-31 NOTE — Assessment & Plan Note (Signed)
Anemia is stable/improving He is not symptomatic Observe only 

## 2018-03-31 NOTE — Progress Notes (Signed)
[name] OFFICE PROGRESS NOTE  Patient Care Team: Jani Gravel, MD as PCP - General (Internal Medicine) Heath Lark, MD as Consulting Physician (Hematology and Oncology)  ASSESSMENT & PLAN:  CLL (chronic lymphocytic leukemia) ([name]) He tolerated treatment very well Lymphocytosis is improving His hemoglobin is improving We will continue treatment without dose adjustment I will see him back in the month for further follow-up  Anemia in neoplastic disease Anemia is stable/improving He is not symptomatic Observe only  Diarrhea due to drug His diarrhea is improving. He will continue close monitoring and Imodium as needed.   No orders of the defined types were placed in this encounter.   INTERVAL HISTORY: Please see below for problem oriented charting. He returns for further follow-up He felt better since last time he was seen Diarrhea is improving He denies abnormal palpitation or symptomatic bruising No recent infection, fever or chills No new lymphadenopathy  SUMMARY OF ONCOLOGIC HISTORY:   CLL (chronic lymphocytic leukemia) ([place])   04/03/2008 Pathology Results    Case #: [id number] FLow cytomtry confirmed CLL    10/11/2014 Tumor Marker    FISH analysis showed trisomy 12 abnormality    04/29/2015 -  Chemotherapy    He started taking Ibrutinib    04/29/2015 Imaging    Ct scan showed mild lymphadenopathy and splenomegaly    05/13/2015 Adverse Reaction    Due to neutropenia, dose of Ibrutinib is reduced to 280 mg daily    01/08/2016 Imaging    Response to therapy. Resolution of thoracic and abdominal adenopathy. Resolution of splenomegaly. 2. No new or progressive disease. 3. Small bowel containing left inguinal hernia and right-sided hydrocele, as before. 4. Esophageal air fluid level suggests dysmotility or gastroesophageal reflux. 5. Possible constipation. 6. Suspect hepatic hemangiomas, similar.    10/09/2016 Imaging    No findings suspicious for active  lymphomatous involvement.  No abdominopelvic lymphadenopathy. Spleen is normal in size.  Moderate to large left inguinal/scrotal hernia containing small bowel and fat. No evidence of bowel obstruction.  Additional stable ancillary findings as above.    04/26/2017 Imaging    1. No lymphadenopathy in the chest, abdomen, or pelvis. 2. Mild circumferential bladder wall thickening. Incomplete distention may contribute to this appearance, but bladder infection is also consideration. 3. Stable appearance small hepatic lesions. Imaging features most suggestive of benign cavernous hemangiomas. 4. Stable tiny low-density renal lesions compatible with cysts. 5. Large left groin hernia contains numerous small bowel loops without complicating features. 6. Right-sided hydrocele.    11/15/2017 Imaging    Large recurrent left inguinal hernia with extension of multiple small bowel loops into the inguinal canal and scrotum on the left. Proximal small bowel dilatation is noted consistent with a degree of incarceration.  Bilateral small effusions as well as patchy bibasilar infiltrates.     12/14/2017 Adverse Reaction    Ibrutinib placed on hold     REVIEW OF SYSTEMS:   Constitutional: Denies fevers, chills or abnormal weight loss Eyes: Denies blurriness of vision Ears, nose, mouth, throat, and face: Denies mucositis or sore throat Respiratory: Denies cough, dyspnea or wheezes Cardiovascular: Denies palpitation, chest discomfort or lower extremity swelling Skin: Denies abnormal skin rashes Lymphatics: Denies new lymphadenopathy or easy bruising Neurological:Denies numbness, tingling or new weaknesses Behavioral/Psych: Mood is stable, no new changes  All other systems were reviewed with the patient and are negative.  I have reviewed the past medical history, past surgical history, social history and family history with the patient and  they are unchanged from previous note.  ALLERGIES:  has No  Known Allergies.  MEDICATIONS:  Current Outpatient Medications  Medication Sig Dispense Refill  . acetaminophen (TYLENOL) 650 MG CR tablet Take 1 tablet (650 mg total) by mouth 2 (two) times daily as needed for pain. 30 tablet 0  . aspirin EC 81 MG tablet Take 81 mg by mouth daily.    . Capsaicin (ASPERCREME PAIN RELIEF PATCH EX) Apply 1 patch topically daily. Apply 1 patch to left shoulder 12 hrs/day. On at 8 am, off at 8 pm    . Cholecalciferol (VITAMIN D3) 1000 units CAPS Take 1,000 Units by mouth 2 (two) times daily.     . collagenase (SANTYL) ointment Apply 1 application topically daily.    . Cyanocobalamin (VITAMIN B 12 PO) Take 1,000 mcg by mouth daily.    Marland Kitchen gabapentin (NEURONTIN) 100 MG capsule Take 100 mg by mouth 3 (three) times daily.    . Ibrutinib 280 MG TABS Take 280 mg by mouth daily. 30 tablet 0  . insulin NPH-regular Human (70-30) 100 UNIT/ML injection Inject 15 Units into the skin 2 (two) times daily with a meal.    . mirtazapine (REMERON) 15 MG tablet Take 1 tablet (15 mg total) by mouth at bedtime.    . Multiple Vitamin (MULTIVITAMIN WITH MINERALS) TABS tablet Take 1 tablet by mouth daily.    . Nutritional Supplements (GLUCERNA 1.0 CAL) LIQD Take 30 Cans by mouth 2 (two) times daily.    Marland Kitchen oxyCODONE (OXY IR/ROXICODONE) 5 MG immediate release tablet Take 1 tablet (5 mg total) by mouth every 6 (six) hours as needed for moderate pain, severe pain or breakthrough pain. 5 tablet 0  . Polyethyl Glycol-Propyl Glycol (SYSTANE OP) Place 1 drop into both eyes 2 (two) times daily.    . polyethylene glycol (MIRALAX / GLYCOLAX) packet Take 17 g by mouth daily. 14 each 0  . tamsulosin (FLOMAX) 0.4 MG CAPS capsule Take 0.4 mg by mouth daily.     No current facility-administered medications for this visit.     PHYSICAL EXAMINATION: ECOG PERFORMANCE STATUS: 1 - Symptomatic but completely ambulatory  Vitals:   03/31/18 1219  BP: (!) 112/55  Pulse: 71  Resp: 18  Temp: 98.2 F (36.8  C)  SpO2: 100%   Filed Weights   03/31/18 1219  Weight: 181 lb 3.2 oz (82.2 kg)    GENERAL:alert, no distress and comfortable SKIN: skin color, texture, turgor are normal, no rashes or significant lesions. Noted minor bruising EYES: normal, Conjunctiva are pink and non-injected, sclera clear OROPHARYNX:no exudate, no erythema and lips, buccal mucosa, and tongue normal  NECK: supple, thyroid normal size, non-tender, without nodularity LYMPH:  no palpable lymphadenopathy in the cervical, axillary or inguinal LUNGS: clear to auscultation and percussion with normal breathing effort HEART: regular rate & rhythm and no murmurs and no lower extremity edema ABDOMEN:abdomen soft, non-tender and normal bowel sounds Musculoskeletal:no cyanosis of digits and no clubbing  NEURO: alert & oriented x 3 with fluent speech, no focal motor/sensory deficits  LABORATORY DATA:  I have reviewed the data as listed    Component Value Date/Time   NA 137 03/30/2018 1150   NA 140 10/09/2016 1106   K 4.7 03/30/2018 1150   K 4.7 10/09/2016 1106   CL 107 03/30/2018 1150   CL 104 03/28/2012 1426   CO2 22 03/30/2018 1150   CO2 28 10/09/2016 1106   GLUCOSE 122 (H) 03/30/2018 1150   GLUCOSE 127  10/09/2016 1106   GLUCOSE 171 (H) 03/28/2012 1426   BUN 27 (H) 03/30/2018 1150   BUN 20.8 10/09/2016 1106   CREATININE 1.01 03/30/2018 1150   CREATININE 0.93 02/08/2018 1059   CREATININE 1.1 10/09/2016 1106   CALCIUM 8.7 (L) 03/30/2018 1150   CALCIUM 9.1 10/09/2016 1106   PROT 6.2 (L) 03/30/2018 1150   PROT 5.8 (L) 10/09/2016 1106   ALBUMIN 3.3 (L) 03/30/2018 1150   ALBUMIN 3.1 (L) 10/09/2016 1106   AST 12 (L) 03/30/2018 1150   AST 14 (L) 02/08/2018 1059   AST 17 10/09/2016 1106   ALT 6 03/30/2018 1150   ALT 8 02/08/2018 1059   ALT 8 10/09/2016 1106   ALKPHOS 42 03/30/2018 1150   ALKPHOS 46 10/09/2016 1106   BILITOT 0.3 03/30/2018 1150   BILITOT 0.3 02/08/2018 1059   BILITOT 0.23 10/09/2016 1106    GFRNONAA >60 03/30/2018 1150   GFRNONAA >60 02/08/2018 1059   GFRAA >60 03/30/2018 1150   GFRAA >60 02/08/2018 1059    No results found for: SPEP, UPEP  Lab Results  Component Value Date   WBC 8.7 03/30/2018   NEUTROABS 3.6 03/30/2018   HGB 10.1 (L) 03/30/2018   HCT 32.1 (L) 03/30/2018   MCV 79.3 (L) 03/30/2018   PLT 253 03/30/2018      Chemistry      Component Value Date/Time   NA 137 03/30/2018 1150   NA 140 10/09/2016 1106   K 4.7 03/30/2018 1150   K 4.7 10/09/2016 1106   CL 107 03/30/2018 1150   CL 104 03/28/2012 1426   CO2 22 03/30/2018 1150   CO2 28 10/09/2016 1106   BUN 27 (H) 03/30/2018 1150   BUN 20.8 10/09/2016 1106   CREATININE 1.01 03/30/2018 1150   CREATININE 0.93 02/08/2018 1059   CREATININE 1.1 10/09/2016 1106      Component Value Date/Time   CALCIUM 8.7 (L) 03/30/2018 1150   CALCIUM 9.1 10/09/2016 1106   ALKPHOS 42 03/30/2018 1150   ALKPHOS 46 10/09/2016 1106   AST 12 (L) 03/30/2018 1150   AST 14 (L) 02/08/2018 1059   AST 17 10/09/2016 1106   ALT 6 03/30/2018 1150   ALT 8 02/08/2018 1059   ALT 8 10/09/2016 1106   BILITOT 0.3 03/30/2018 1150   BILITOT 0.3 02/08/2018 1059   BILITOT 0.23 10/09/2016 1106      All questions were answered. The patient knows to call the clinic with any problems, questions or concerns. No barriers to learning was detected.  I spent 15 minutes counseling the patient face to face. The total time spent in the appointment was 20 minutes and more than 50% was on counseling and review of test results  Heath Lark, MD 03/31/2018 1:04 PM

## 2018-03-31 NOTE — Assessment & Plan Note (Signed)
His diarrhea is improving. He will continue close monitoring and Imodium as needed.

## 2018-04-20 DIAGNOSIS — R69 Illness, unspecified: Secondary | ICD-10-CM | POA: Diagnosis not present

## 2018-04-23 ENCOUNTER — Other Ambulatory Visit: Payer: Self-pay | Admitting: Hematology and Oncology

## 2018-05-02 ENCOUNTER — Inpatient Hospital Stay: Payer: Medicare HMO

## 2018-05-02 ENCOUNTER — Telehealth: Payer: Self-pay | Admitting: Hematology and Oncology

## 2018-05-02 ENCOUNTER — Inpatient Hospital Stay: Payer: Medicare HMO | Admitting: Hematology and Oncology

## 2018-05-02 NOTE — Telephone Encounter (Signed)
Tried to reach regarding 5/11 I did leave a message

## 2018-05-09 MED FILL — IMBRUVICA 280 MG TAB: 280 | 28 days supply | Qty: 28 | Fill #0

## 2018-05-30 ENCOUNTER — Telehealth: Payer: Self-pay

## 2018-05-30 NOTE — Telephone Encounter (Signed)
Oral Oncology Patient Advocate Encounter  Was successful in securing patient a $8000 grant from [employer] to provide copayment coverage for Imbruvica. This will keep the out of pocket expense at $0.   I called the patient and left a voicemail.   The billing information is as follows and has been shared with [place].   Member ID: [id number] Group ID: [id number] RxBin: [id number] Dates of Eligibility: 04/30/18 through 04/29/19  [place] Patient [place] Phone [phone] Fax [phone] 05/30/2018    3:57 PM

## 2018-06-02 ENCOUNTER — Other Ambulatory Visit: Payer: Self-pay | Admitting: Hematology and Oncology

## 2018-06-06 ENCOUNTER — Inpatient Hospital Stay: Payer: Medicare HMO | Attending: Hematology and Oncology | Admitting: Hematology and Oncology

## 2018-06-06 ENCOUNTER — Encounter: Payer: Self-pay | Admitting: Hematology and Oncology

## 2018-06-06 ENCOUNTER — Other Ambulatory Visit: Payer: Self-pay

## 2018-06-06 ENCOUNTER — Inpatient Hospital Stay: Payer: Medicare HMO

## 2018-06-06 DIAGNOSIS — R600 Localized edema: Secondary | ICD-10-CM | POA: Diagnosis not present

## 2018-06-06 DIAGNOSIS — D630 Anemia in neoplastic disease: Secondary | ICD-10-CM | POA: Insufficient documentation

## 2018-06-06 DIAGNOSIS — C9110 Chronic lymphocytic leukemia of B-cell type not having achieved remission: Secondary | ICD-10-CM

## 2018-06-06 DIAGNOSIS — Z79899 Other long term (current) drug therapy: Secondary | ICD-10-CM | POA: Diagnosis not present

## 2018-06-06 LAB — COMPREHENSIVE METABOLIC PANEL
ALT: 11 U/L (ref 0–44)
AST: 12 U/L — ABNORMAL LOW (ref 15–41)
Albumin: 3.2 g/dL — ABNORMAL LOW (ref 3.5–5.0)
Alkaline Phosphatase: 37 U/L — ABNORMAL LOW (ref 38–126)
Anion gap: 8 (ref 5–15)
BUN: 24 mg/dL — ABNORMAL HIGH (ref 8–23)
CO2: 22 mmol/L (ref 22–32)
Calcium: 8.4 mg/dL — ABNORMAL LOW (ref 8.9–10.3)
Chloride: 110 mmol/L (ref 98–111)
Creatinine, Ser: 1.02 mg/dL (ref 0.61–1.24)
GFR calc Af Amer: >60 mL/min (ref >60)
GFR calc non Af Amer: >60 mL/min (ref >60)
Glucose, Bld: 80 mg/dL (ref 70–99)
Potassium: 4.7 mmol/L (ref 3.5–5.1)
Sodium: 140 mmol/L (ref 135–145)
Total Bilirubin: 0.2 mg/dL — ABNORMAL LOW (ref 0.3–1.2)
Total Protein: 6 g/dL — ABNORMAL LOW (ref 6.5–8.1)

## 2018-06-06 LAB — CBC WITH DIFFERENTIAL/PLATELET
Abs Immature Granulocytes: 0.02 10*3/uL (ref 0.00–0.07)
Basophils Absolute: 0.1 10*3/uL (ref 0.0–0.1)
Basophils Relative: 1 %
Eosinophils Absolute: 0.1 10*3/uL (ref 0.0–0.5)
Eosinophils Relative: 2 %
HCT: 30.8 % — ABNORMAL LOW (ref 39.0–52.0)
Hemoglobin: 9.4 g/dL — ABNORMAL LOW (ref 13.0–17.0)
Immature Granulocytes: 0 %
Lymphocytes Relative: 58 %
Lymphs Abs: 4.7 10*3/uL — ABNORMAL HIGH (ref 0.7–4.0)
MCH: 25.2 pg — ABNORMAL LOW (ref 26.0–34.0)
MCHC: 30.5 g/dL (ref 30.0–36.0)
MCV: 82.6 fL (ref 80.0–100.0)
Monocytes Absolute: 0.4 10*3/uL (ref 0.1–1.0)
Monocytes Relative: 5 %
Neutro Abs: 2.7 10*3/uL (ref 1.7–7.7)
Neutrophils Relative %: 34 %
Platelets: 281 10*3/uL (ref 150–400)
RBC: 3.73 MIL/uL — ABNORMAL LOW (ref 4.22–5.81)
RDW: 18.9 % — ABNORMAL HIGH (ref 11.5–15.5)
WBC: 8.1 10*3/uL (ref 4.0–10.5)
nRBC: 0 % (ref 0.0–0.2)

## 2018-06-06 LAB — URIC ACID: Uric Acid, Serum: 6.7 mg/dL (ref 3.7–8.6)

## 2018-06-06 MED FILL — IMBRUVICA 280 MG TAB: 280 | 28 days supply | Qty: 28 | Fill #0

## 2018-06-06 NOTE — Assessment & Plan Note (Signed)
He tolerated treatment very well Lymphocytosis is improving/stable His hemoglobin is stable We will continue treatment without dose adjustment I will see him back 3 months for follow-up

## 2018-06-06 NOTE — Progress Notes (Signed)
[place] OFFICE PROGRESS NOTE  Patient Care Team: Jani Gravel, MD as PCP - General (Internal Medicine) Heath Lark, MD as Consulting Physician (Hematology and Oncology)  ASSESSMENT & PLAN:  CLL (chronic lymphocytic leukemia) ([place]) He tolerated treatment very well Lymphocytosis is improving/stable His hemoglobin is stable We will continue treatment without dose adjustment I will see him back 3 months for follow-up  Anemia in neoplastic disease Anemia is stable/improving He is not symptomatic Observe only  Bilateral leg edema The patient has gained almost 9 pounds since I saw him 2 months ago He has significant bilateral lower extremity edema He told me he is on a diuretic therapy I recommend close follow-up with his primary care doctor for medication adjustment if needed.   No orders of the defined types were placed in this encounter.   INTERVAL HISTORY: Please see below for problem oriented charting. He returns for further follow-up He tolerated ibrutinib well Denies abnormal heart palpitation, diarrhea or increased bruising/bleeding He has chronic bilateral lower extremity edema, stable No new lymphadenopathy Denies recent infection, fever or chills  SUMMARY OF ONCOLOGIC HISTORY:   CLL (chronic lymphocytic leukemia) ([place])   04/03/2008 Pathology Results    Case #: [id number] FLow cytomtry confirmed CLL    10/11/2014 Tumor Marker    FISH analysis showed trisomy 12 abnormality    04/29/2015 -  Chemotherapy    He started taking Ibrutinib    04/29/2015 Imaging    Ct scan showed mild lymphadenopathy and splenomegaly    05/13/2015 Adverse Reaction    Due to neutropenia, dose of Ibrutinib is reduced to 280 mg daily    01/08/2016 Imaging    Response to therapy. Resolution of thoracic and abdominal adenopathy. Resolution of splenomegaly. 2. No new or progressive disease. 3. Small bowel containing left inguinal hernia and right-sided hydrocele, as before. 4.  Esophageal air fluid level suggests dysmotility or gastroesophageal reflux. 5. Possible constipation. 6. Suspect hepatic hemangiomas, similar.    10/09/2016 Imaging    No findings suspicious for active lymphomatous involvement.  No abdominopelvic lymphadenopathy. Spleen is normal in size.  Moderate to large left inguinal/scrotal hernia containing small bowel and fat. No evidence of bowel obstruction.  Additional stable ancillary findings as above.    04/26/2017 Imaging    1. No lymphadenopathy in the chest, abdomen, or pelvis. 2. Mild circumferential bladder wall thickening. Incomplete distention may contribute to this appearance, but bladder infection is also consideration. 3. Stable appearance small hepatic lesions. Imaging features most suggestive of benign cavernous hemangiomas. 4. Stable tiny low-density renal lesions compatible with cysts. 5. Large left groin hernia contains numerous small bowel loops without complicating features. 6. Right-sided hydrocele.    11/15/2017 Imaging    Large recurrent left inguinal hernia with extension of multiple small bowel loops into the inguinal canal and scrotum on the left. Proximal small bowel dilatation is noted consistent with a degree of incarceration.  Bilateral small effusions as well as patchy bibasilar infiltrates.     12/14/2017 Adverse Reaction    Ibrutinib placed on hold     REVIEW OF SYSTEMS:   Constitutional: Denies fevers, chills or abnormal weight loss Eyes: Denies blurriness of vision Ears, nose, mouth, throat, and face: Denies mucositis or sore throat Respiratory: Denies cough, dyspnea or wheezes Cardiovascular: Denies palpitation, chest discomfort  Gastrointestinal:  Denies nausea, heartburn or change in bowel habits Skin: Denies abnormal skin rashes Lymphatics: Denies new lymphadenopathy or easy bruising Neurological:Denies numbness, tingling or new weaknesses Behavioral/Psych: Mood is  stable, no new changes  All  other systems were reviewed with the patient and are negative.  I have reviewed the past medical history, past surgical history, social history and family history with the patient and they are unchanged from previous note.  ALLERGIES:  has No Known Allergies.  MEDICATIONS:  Current Outpatient Medications  Medication Sig Dispense Refill  . acetaminophen (TYLENOL) 650 MG CR tablet Take 1 tablet (650 mg total) by mouth 2 (two) times daily as needed for pain. 30 tablet 0  . aspirin EC 81 MG tablet Take 81 mg by mouth daily.    . Capsaicin (ASPERCREME PAIN RELIEF PATCH EX) Apply 1 patch topically daily. Apply 1 patch to left shoulder 12 hrs/day. On at 8 am, off at 8 pm    . Cholecalciferol (VITAMIN D3) 1000 units CAPS Take 1,000 Units by mouth 2 (two) times daily.     . collagenase (SANTYL) ointment Apply 1 application topically daily.    . Cyanocobalamin (VITAMIN B 12 PO) Take 1,000 mcg by mouth daily.    Marland Kitchen gabapentin (NEURONTIN) 100 MG capsule Take 100 mg by mouth 3 (three) times daily.    . IMBRUVICA 280 MG TABS TAKE 1 TABLET (280MG ) BY MOUTH DAILY. 28 tablet 0  . insulin NPH-regular Human (70-30) 100 UNIT/ML injection Inject 15 Units into the skin 2 (two) times daily with a meal.    . mirtazapine (REMERON) 15 MG tablet Take 1 tablet (15 mg total) by mouth at bedtime.    . Multiple Vitamin (MULTIVITAMIN WITH MINERALS) TABS tablet Take 1 tablet by mouth daily.    . Nutritional Supplements (GLUCERNA 1.0 CAL) LIQD Take 30 Cans by mouth 2 (two) times daily.    Marland Kitchen oxyCODONE (OXY IR/ROXICODONE) 5 MG immediate release tablet Take 1 tablet (5 mg total) by mouth every 6 (six) hours as needed for moderate pain, severe pain or breakthrough pain. 5 tablet 0  . Polyethyl Glycol-Propyl Glycol (SYSTANE OP) Place 1 drop into both eyes 2 (two) times daily.    . polyethylene glycol (MIRALAX / GLYCOLAX) packet Take 17 g by mouth daily. 14 each 0  . tamsulosin (FLOMAX) 0.4 MG CAPS capsule Take 0.4 mg by mouth  daily.     No current facility-administered medications for this visit.     PHYSICAL EXAMINATION: ECOG PERFORMANCE STATUS: 1 - Symptomatic but completely ambulatory  Vitals:   06/06/18 1150  BP: (!) 130/51  Pulse: 74  Resp: 18  Temp: 99.8 F (37.7 C)  SpO2: 100%   Filed Weights   06/06/18 1150  Weight: 190 lb (86.2 kg)    GENERAL:alert, no distress and comfortable SKIN: skin color, texture, turgor are normal, no rashes or significant lesions EYES: normal, Conjunctiva are pink and non-injected, sclera clear OROPHARYNX:no exudate, no erythema and lips, buccal mucosa, and tongue normal  NECK: supple, thyroid normal size, non-tender, without nodularity LYMPH:  no palpable lymphadenopathy in the cervical, axillary or inguinal LUNGS: clear to auscultation and percussion with normal breathing effort HEART: regular rate & rhythm and no murmurs with moderate lower extremity edema ABDOMEN:abdomen soft, non-tender and normal bowel sounds Musculoskeletal:no cyanosis of digits and no clubbing  NEURO: alert & oriented x 3 with fluent speech, no focal motor/sensory deficits  LABORATORY DATA:  I have reviewed the data as listed    Component Value Date/Time   NA 140 06/06/2018 1129   NA 140 10/09/2016 1106   K 4.7 06/06/2018 1129   K 4.7 10/09/2016 1106   CL  110 06/06/2018 1129   CL 104 03/28/2012 1426   CO2 22 06/06/2018 1129   CO2 28 10/09/2016 1106   GLUCOSE 80 06/06/2018 1129   GLUCOSE 127 10/09/2016 1106   GLUCOSE 171 (H) 03/28/2012 1426   BUN 24 (H) 06/06/2018 1129   BUN 20.8 10/09/2016 1106   CREATININE 1.02 06/06/2018 1129   CREATININE 0.93 02/08/2018 1059   CREATININE 1.1 10/09/2016 1106   CALCIUM 8.4 (L) 06/06/2018 1129   CALCIUM 9.1 10/09/2016 1106   PROT 6.0 (L) 06/06/2018 1129   PROT 5.8 (L) 10/09/2016 1106   ALBUMIN 3.2 (L) 06/06/2018 1129   ALBUMIN 3.1 (L) 10/09/2016 1106   AST 12 (L) 06/06/2018 1129   AST 14 (L) 02/08/2018 1059   AST 17 10/09/2016 1106    ALT 11 06/06/2018 1129   ALT 8 02/08/2018 1059   ALT 8 10/09/2016 1106   ALKPHOS 37 (L) 06/06/2018 1129   ALKPHOS 46 10/09/2016 1106   BILITOT 0.2 (L) 06/06/2018 1129   BILITOT 0.3 02/08/2018 1059   BILITOT 0.23 10/09/2016 1106   GFRNONAA >60 06/06/2018 1129   GFRNONAA >60 02/08/2018 1059   GFRAA >60 06/06/2018 1129   GFRAA >60 02/08/2018 1059    No results found for: SPEP, UPEP  Lab Results  Component Value Date   WBC 8.1 06/06/2018   NEUTROABS 2.7 06/06/2018   HGB 9.4 (L) 06/06/2018   HCT 30.8 (L) 06/06/2018   MCV 82.6 06/06/2018   PLT 281 06/06/2018      Chemistry      Component Value Date/Time   NA 140 06/06/2018 1129   NA 140 10/09/2016 1106   K 4.7 06/06/2018 1129   K 4.7 10/09/2016 1106   CL 110 06/06/2018 1129   CL 104 03/28/2012 1426   CO2 22 06/06/2018 1129   CO2 28 10/09/2016 1106   BUN 24 (H) 06/06/2018 1129   BUN 20.8 10/09/2016 1106   CREATININE 1.02 06/06/2018 1129   CREATININE 0.93 02/08/2018 1059   CREATININE 1.1 10/09/2016 1106      Component Value Date/Time   CALCIUM 8.4 (L) 06/06/2018 1129   CALCIUM 9.1 10/09/2016 1106   ALKPHOS 37 (L) 06/06/2018 1129   ALKPHOS 46 10/09/2016 1106   AST 12 (L) 06/06/2018 1129   AST 14 (L) 02/08/2018 1059   AST 17 10/09/2016 1106   ALT 11 06/06/2018 1129   ALT 8 02/08/2018 1059   ALT 8 10/09/2016 1106   BILITOT 0.2 (L) 06/06/2018 1129   BILITOT 0.3 02/08/2018 1059   BILITOT 0.23 10/09/2016 1106      All questions were answered. The patient knows to call the clinic with any problems, questions or concerns. No barriers to learning was detected.  I spent 15 minutes counseling the patient face to face. The total time spent in the appointment was 20 minutes and more than 50% was on counseling and review of test results  Heath Lark, MD 06/06/2018 12:41 PM

## 2018-06-06 NOTE — Assessment & Plan Note (Signed)
The patient has gained almost 9 pounds since I saw him 2 months ago He has significant bilateral lower extremity edema He told me he is on a diuretic therapy I recommend close follow-up with his primary care doctor for medication adjustment if needed.

## 2018-06-06 NOTE — Assessment & Plan Note (Signed)
Anemia is stable/improving He is not symptomatic Observe only

## 2018-06-07 ENCOUNTER — Telehealth: Payer: Self-pay | Admitting: Hematology and Oncology

## 2018-06-07 NOTE — Telephone Encounter (Signed)
Called regarding schedule °

## 2018-06-18 DIAGNOSIS — E119 Type 2 diabetes mellitus without complications: Secondary | ICD-10-CM | POA: Diagnosis not present

## 2018-06-29 ENCOUNTER — Other Ambulatory Visit: Payer: Self-pay | Admitting: Hematology and Oncology

## 2018-07-04 MED FILL — IMBRUVICA 280 MG TAB: 280 | 28 days supply | Qty: 28 | Fill #0

## 2018-07-18 DIAGNOSIS — G894 Chronic pain syndrome: Secondary | ICD-10-CM | POA: Diagnosis not present

## 2018-07-18 DIAGNOSIS — Z79891 Long term (current) use of opiate analgesic: Secondary | ICD-10-CM | POA: Diagnosis not present

## 2018-07-18 DIAGNOSIS — Z5181 Encounter for therapeutic drug level monitoring: Secondary | ICD-10-CM | POA: Diagnosis not present

## 2018-07-18 DIAGNOSIS — Z79899 Other long term (current) drug therapy: Secondary | ICD-10-CM | POA: Diagnosis not present

## 2018-07-28 ENCOUNTER — Other Ambulatory Visit: Payer: Self-pay | Admitting: Hematology and Oncology

## 2018-08-02 MED FILL — IMBRUVICA 280 MG TAB: 280 | 28 days supply | Qty: 28 | Fill #0

## 2018-08-18 DIAGNOSIS — R69 Illness, unspecified: Secondary | ICD-10-CM | POA: Diagnosis not present

## 2018-08-22 DIAGNOSIS — E785 Hyperlipidemia, unspecified: Secondary | ICD-10-CM | POA: Diagnosis not present

## 2018-08-22 DIAGNOSIS — Z0000 Encounter for general adult medical examination without abnormal findings: Secondary | ICD-10-CM | POA: Diagnosis not present

## 2018-08-22 DIAGNOSIS — N390 Urinary tract infection, site not specified: Secondary | ICD-10-CM | POA: Diagnosis not present

## 2018-08-22 DIAGNOSIS — I10 Essential (primary) hypertension: Secondary | ICD-10-CM | POA: Diagnosis not present

## 2018-08-22 DIAGNOSIS — R739 Hyperglycemia, unspecified: Secondary | ICD-10-CM | POA: Diagnosis not present

## 2018-08-29 DIAGNOSIS — N401 Enlarged prostate with lower urinary tract symptoms: Secondary | ICD-10-CM | POA: Diagnosis not present

## 2018-08-29 DIAGNOSIS — N433 Hydrocele, unspecified: Secondary | ICD-10-CM | POA: Diagnosis not present

## 2018-08-29 DIAGNOSIS — M48061 Spinal stenosis, lumbar region without neurogenic claudication: Secondary | ICD-10-CM | POA: Diagnosis not present

## 2018-08-29 DIAGNOSIS — K5710 Diverticulosis of small intestine without perforation or abscess without bleeding: Secondary | ICD-10-CM | POA: Diagnosis not present

## 2018-08-29 DIAGNOSIS — R161 Splenomegaly, not elsewhere classified: Secondary | ICD-10-CM | POA: Diagnosis not present

## 2018-08-29 DIAGNOSIS — K219 Gastro-esophageal reflux disease without esophagitis: Secondary | ICD-10-CM | POA: Diagnosis not present

## 2018-08-29 DIAGNOSIS — R351 Nocturia: Secondary | ICD-10-CM | POA: Diagnosis not present

## 2018-08-29 DIAGNOSIS — E118 Type 2 diabetes mellitus with unspecified complications: Secondary | ICD-10-CM | POA: Diagnosis not present

## 2018-08-29 DIAGNOSIS — C9190 Lymphoid leukemia, unspecified not having achieved remission: Secondary | ICD-10-CM | POA: Diagnosis not present

## 2018-08-29 DIAGNOSIS — E7800 Pure hypercholesterolemia, unspecified: Secondary | ICD-10-CM | POA: Diagnosis not present

## 2018-08-31 MED FILL — IMBRUVICA 280 MG TAB: 280 | 28 days supply | Qty: 28 | Fill #1

## 2018-09-05 ENCOUNTER — Inpatient Hospital Stay: Payer: Medicare HMO

## 2018-09-05 ENCOUNTER — Inpatient Hospital Stay: Payer: Medicare HMO | Admitting: Hematology and Oncology

## 2018-09-05 DIAGNOSIS — H35043 Retinal micro-aneurysms, unspecified, bilateral: Secondary | ICD-10-CM | POA: Diagnosis not present

## 2018-09-05 DIAGNOSIS — H31009 Unspecified chorioretinal scars, unspecified eye: Secondary | ICD-10-CM | POA: Diagnosis not present

## 2018-09-05 DIAGNOSIS — E113553 Type 2 diabetes mellitus with stable proliferative diabetic retinopathy, bilateral: Secondary | ICD-10-CM | POA: Diagnosis not present

## 2018-09-05 DIAGNOSIS — H35371 Puckering of macula, right eye: Secondary | ICD-10-CM | POA: Diagnosis not present

## 2018-09-06 ENCOUNTER — Inpatient Hospital Stay: Payer: Medicare HMO | Attending: Hematology and Oncology

## 2018-09-06 ENCOUNTER — Inpatient Hospital Stay (HOSPITAL_BASED_OUTPATIENT_CLINIC_OR_DEPARTMENT_OTHER): Payer: Medicare HMO | Admitting: Hematology and Oncology

## 2018-09-06 ENCOUNTER — Other Ambulatory Visit: Payer: Self-pay

## 2018-09-06 DIAGNOSIS — D630 Anemia in neoplastic disease: Secondary | ICD-10-CM | POA: Diagnosis not present

## 2018-09-06 DIAGNOSIS — R600 Localized edema: Secondary | ICD-10-CM

## 2018-09-06 DIAGNOSIS — C9110 Chronic lymphocytic leukemia of B-cell type not having achieved remission: Secondary | ICD-10-CM | POA: Diagnosis not present

## 2018-09-06 DIAGNOSIS — Z79899 Other long term (current) drug therapy: Secondary | ICD-10-CM | POA: Insufficient documentation

## 2018-09-06 LAB — CMP (CANCER CENTER ONLY)
ALT: 12 U/L (ref 0–44)
AST: 18 U/L (ref 15–41)
Albumin: 3.4 g/dL — ABNORMAL LOW (ref 3.5–5.0)
Alkaline Phosphatase: 37 U/L — ABNORMAL LOW (ref 38–126)
Anion gap: 8 (ref 5–15)
BUN: 29 mg/dL — ABNORMAL HIGH (ref 8–23)
CO2: 24 mmol/L (ref 22–32)
Calcium: 9 mg/dL (ref 8.9–10.3)
Chloride: 109 mmol/L (ref 98–111)
Creatinine: 1.15 mg/dL (ref 0.61–1.24)
GFR, Est AFR Am: >60 mL/min (ref >60)
GFR, Estimated: 57 mL/min — ABNORMAL LOW (ref >60)
Glucose, Bld: 68 mg/dL — ABNORMAL LOW (ref 70–99)
Potassium: 4.8 mmol/L (ref 3.5–5.1)
Sodium: 141 mmol/L (ref 135–145)
Total Bilirubin: 0.3 mg/dL (ref 0.3–1.2)
Total Protein: 6.3 g/dL — ABNORMAL LOW (ref 6.5–8.1)

## 2018-09-06 LAB — CBC WITH DIFFERENTIAL/PLATELET
Abs Immature Granulocytes: 0.04 10*3/uL (ref 0.00–0.07)
Basophils Absolute: 0.1 10*3/uL (ref 0.0–0.1)
Basophils Relative: 1 %
Eosinophils Absolute: 0.2 10*3/uL (ref 0.0–0.5)
Eosinophils Relative: 2 %
HCT: 30.2 % — ABNORMAL LOW (ref 39.0–52.0)
Hemoglobin: 9.3 g/dL — ABNORMAL LOW (ref 13.0–17.0)
Immature Granulocytes: 1 %
Lymphocytes Relative: 51 %
Lymphs Abs: 4.2 10*3/uL — ABNORMAL HIGH (ref 0.7–4.0)
MCH: 25.1 pg — ABNORMAL LOW (ref 26.0–34.0)
MCHC: 30.8 g/dL (ref 30.0–36.0)
MCV: 81.6 fL (ref 80.0–100.0)
Monocytes Absolute: 0.4 10*3/uL (ref 0.1–1.0)
Monocytes Relative: 5 %
Neutro Abs: 3.3 10*3/uL (ref 1.7–7.7)
Neutrophils Relative %: 40 %
Platelets: 295 10*3/uL (ref 150–400)
RBC: 3.7 MIL/uL — ABNORMAL LOW (ref 4.22–5.81)
RDW: 16.9 % — ABNORMAL HIGH (ref 11.5–15.5)
WBC: 8.1 10*3/uL (ref 4.0–10.5)
nRBC: 0 % (ref 0.0–0.2)

## 2018-09-07 ENCOUNTER — Telehealth: Payer: Self-pay | Admitting: Hematology and Oncology

## 2018-09-07 ENCOUNTER — Encounter: Payer: Self-pay | Admitting: Hematology and Oncology

## 2018-09-07 NOTE — Assessment & Plan Note (Signed)
He tolerated treatment very well Lymphocytosis is improving/stable His hemoglobin is stable We will continue treatment without dose adjustment I will see him back 3 months for follow-up

## 2018-09-07 NOTE — Assessment & Plan Note (Signed)
Anemia is stable/improving It is likely related to chronic kidney disease and his underlying bone marrow disorder He is not symptomatic Observe only

## 2018-09-07 NOTE — Progress Notes (Signed)
[place] OFFICE PROGRESS NOTE  Patient Care Team: Jani Gravel, MD as PCP - General (Internal Medicine) Heath Lark, MD as Consulting Physician (Hematology and Oncology)  ASSESSMENT & PLAN:  CLL (chronic lymphocytic leukemia) ([place]) He tolerated treatment very well Lymphocytosis is improving/stable His hemoglobin is stable We will continue treatment without dose adjustment I will see him back 3 months for follow-up  Bilateral leg edema The patient has gained almost 4 pounds since I saw him 3 months ago He has significant bilateral lower extremity edema He told me he is on a diuretic therapy I recommend close follow-up with his primary care doctor for medication adjustment if needed.  Anemia in neoplastic disease Anemia is stable/improving It is likely related to chronic kidney disease and his underlying bone marrow disorder He is not symptomatic Observe only   Orders Placed This Encounter  Procedures  . CMP ([place] only)    INTERVAL HISTORY: Please see below for problem oriented charting. He returns for further follow-up He tolerated Imbruvica well Denies excessive bruising, abnormal heart palpitation or diarrhea He continues to have chronic lower extremity edema but stable No recent infection, fever or chills  SUMMARY OF ONCOLOGIC HISTORY: Oncology History  CLL (chronic lymphocytic leukemia) ([place])  04/03/2008 Pathology Results   Case #: [id number] FLow cytomtry confirmed CLL   10/11/2014 Tumor Marker   FISH analysis showed trisomy 12 abnormality   04/29/2015 -  Chemotherapy   He started taking Ibrutinib   04/29/2015 Imaging   Ct scan showed mild lymphadenopathy and splenomegaly   05/13/2015 Adverse Reaction   Due to neutropenia, dose of Ibrutinib is reduced to 280 mg daily   01/08/2016 Imaging   Response to therapy. Resolution of thoracic and abdominal adenopathy. Resolution of splenomegaly. 2. No new or progressive disease. 3. Small bowel  containing left inguinal hernia and right-sided hydrocele, as before. 4. Esophageal air fluid level suggests dysmotility or gastroesophageal reflux. 5. Possible constipation. 6. Suspect hepatic hemangiomas, similar.   10/09/2016 Imaging   No findings suspicious for active lymphomatous involvement.  No abdominopelvic lymphadenopathy. Spleen is normal in size.  Moderate to large left inguinal/scrotal hernia containing small bowel and fat. No evidence of bowel obstruction.  Additional stable ancillary findings as above.   04/26/2017 Imaging   1. No lymphadenopathy in the chest, abdomen, or pelvis. 2. Mild circumferential bladder wall thickening. Incomplete distention may contribute to this appearance, but bladder infection is also consideration. 3. Stable appearance small hepatic lesions. Imaging features most suggestive of benign cavernous hemangiomas. 4. Stable tiny low-density renal lesions compatible with cysts. 5. Large left groin hernia contains numerous small bowel loops without complicating features. 6. Right-sided hydrocele.   11/15/2017 Imaging   Large recurrent left inguinal hernia with extension of multiple small bowel loops into the inguinal canal and scrotum on the left. Proximal small bowel dilatation is noted consistent with a degree of incarceration.  Bilateral small effusions as well as patchy bibasilar infiltrates.    12/14/2017 Adverse Reaction   Ibrutinib placed on hold     REVIEW OF SYSTEMS:   Constitutional: Denies fevers, chills or abnormal weight loss Eyes: Denies blurriness of vision Ears, nose, mouth, throat, and face: Denies mucositis or sore throat Respiratory: Denies cough, dyspnea or wheezes Cardiovascular: Denies palpitation, chest discomfort or lower extremity swelling Gastrointestinal:  Denies nausea, heartburn or change in bowel habits Skin: Denies abnormal skin rashes Lymphatics: Denies new lymphadenopathy or easy bruising Neurological:Denies  numbness, tingling or new weaknesses Behavioral/Psych: Mood is  stable, no new changes  All other systems were reviewed with the patient and are negative.  I have reviewed the past medical history, past surgical history, social history and family history with the patient and they are unchanged from previous note.  ALLERGIES:  has No Known Allergies.  MEDICATIONS:  Current Outpatient Medications  Medication Sig Dispense Refill  . acetaminophen (TYLENOL) 650 MG CR tablet Take 1 tablet (650 mg total) by mouth 2 (two) times daily as needed for pain. 30 tablet 0  . aspirin EC 81 MG tablet Take 81 mg by mouth daily.    . Capsaicin (ASPERCREME PAIN RELIEF PATCH EX) Apply 1 patch topically daily. Apply 1 patch to left shoulder 12 hrs/day. On at 8 am, off at 8 pm    . Cholecalciferol (VITAMIN D3) 1000 units CAPS Take 1,000 Units by mouth 2 (two) times daily.     . collagenase (SANTYL) ointment Apply 1 application topically daily.    . Cyanocobalamin (VITAMIN B 12 PO) Take 1,000 mcg by mouth daily.    Marland Kitchen gabapentin (NEURONTIN) 100 MG capsule Take 100 mg by mouth 3 (three) times daily.    . IMBRUVICA 280 MG TABS TAKE 1 TABLET (280MG ) BY MOUTH DAILY. 28 tablet 11  . insulin NPH-regular Human (70-30) 100 UNIT/ML injection Inject 15 Units into the skin 2 (two) times daily with a meal.    . mirtazapine (REMERON) 15 MG tablet Take 1 tablet (15 mg total) by mouth at bedtime.    . Multiple Vitamin (MULTIVITAMIN WITH MINERALS) TABS tablet Take 1 tablet by mouth daily.    . Nutritional Supplements (GLUCERNA 1.0 CAL) LIQD Take 30 Cans by mouth 2 (two) times daily.    Marland Kitchen oxyCODONE (OXY IR/ROXICODONE) 5 MG immediate release tablet Take 1 tablet (5 mg total) by mouth every 6 (six) hours as needed for moderate pain, severe pain or breakthrough pain. 5 tablet 0  . Polyethyl Glycol-Propyl Glycol (SYSTANE OP) Place 1 drop into both eyes 2 (two) times daily.    . polyethylene glycol (MIRALAX / GLYCOLAX) packet Take 17 g by  mouth daily. 14 each 0  . tamsulosin (FLOMAX) 0.4 MG CAPS capsule Take 0.4 mg by mouth daily.     No current facility-administered medications for this visit.     PHYSICAL EXAMINATION: ECOG PERFORMANCE STATUS: 2 - Symptomatic, <50% confined to bed  Vitals:   09/06/18 1127  BP: (!) 120/46  Pulse: 61  Resp: 18  Temp: 98.3 F (36.8 C)  SpO2: 99%   Filed Weights   09/06/18 1127  Weight: 193 lb 9.6 oz (87.8 kg)    GENERAL:alert, no distress and comfortable SKIN: skin color, texture, turgor are normal, no rashes or significant lesions EYES: normal, Conjunctiva are pink and non-injected, sclera clear OROPHARYNX:no exudate, no erythema and lips, buccal mucosa, and tongue normal  NECK: supple, thyroid normal size, non-tender, without nodularity LYMPH:  no palpable lymphadenopathy in the cervical, axillary or inguinal LUNGS: clear to auscultation and percussion with normal breathing effort HEART: regular rate & rhythm and no murmurs with moderate bilateral lower extremity edema ABDOMEN:abdomen soft, non-tender and normal bowel sounds Musculoskeletal:no cyanosis of digits and no clubbing  NEURO: alert & oriented x 3 with fluent speech, no focal motor/sensory deficits  LABORATORY DATA:  I have reviewed the data as listed    Component Value Date/Time   NA 141 09/06/2018 1016   NA 140 10/09/2016 1106   K 4.8 09/06/2018 1016   K 4.7 10/09/2016 1106  CL 109 09/06/2018 1016   CL 104 03/28/2012 1426   CO2 24 09/06/2018 1016   CO2 28 10/09/2016 1106   GLUCOSE 68 (L) 09/06/2018 1016   GLUCOSE 127 10/09/2016 1106   GLUCOSE 171 (H) 03/28/2012 1426   BUN 29 (H) 09/06/2018 1016   BUN 20.8 10/09/2016 1106   CREATININE 1.15 09/06/2018 1016   CREATININE 1.1 10/09/2016 1106   CALCIUM 9.0 09/06/2018 1016   CALCIUM 9.1 10/09/2016 1106   PROT 6.3 (L) 09/06/2018 1016   PROT 5.8 (L) 10/09/2016 1106   ALBUMIN 3.4 (L) 09/06/2018 1016   ALBUMIN 3.1 (L) 10/09/2016 1106   AST 18 09/06/2018  1016   AST 17 10/09/2016 1106   ALT 12 09/06/2018 1016   ALT 8 10/09/2016 1106   ALKPHOS 37 (L) 09/06/2018 1016   ALKPHOS 46 10/09/2016 1106   BILITOT 0.3 09/06/2018 1016   BILITOT 0.23 10/09/2016 1106   GFRNONAA 57 (L) 09/06/2018 1016   GFRAA >60 09/06/2018 1016    No results found for: SPEP, UPEP  Lab Results  Component Value Date   WBC 8.1 09/06/2018   NEUTROABS 3.3 09/06/2018   HGB 9.3 (L) 09/06/2018   HCT 30.2 (L) 09/06/2018   MCV 81.6 09/06/2018   PLT 295 09/06/2018      Chemistry      Component Value Date/Time   NA 141 09/06/2018 1016   NA 140 10/09/2016 1106   K 4.8 09/06/2018 1016   K 4.7 10/09/2016 1106   CL 109 09/06/2018 1016   CL 104 03/28/2012 1426   CO2 24 09/06/2018 1016   CO2 28 10/09/2016 1106   BUN 29 (H) 09/06/2018 1016   BUN 20.8 10/09/2016 1106   CREATININE 1.15 09/06/2018 1016   CREATININE 1.1 10/09/2016 1106      Component Value Date/Time   CALCIUM 9.0 09/06/2018 1016   CALCIUM 9.1 10/09/2016 1106   ALKPHOS 37 (L) 09/06/2018 1016   ALKPHOS 46 10/09/2016 1106   AST 18 09/06/2018 1016   AST 17 10/09/2016 1106   ALT 12 09/06/2018 1016   ALT 8 10/09/2016 1106   BILITOT 0.3 09/06/2018 1016   BILITOT 0.23 10/09/2016 1106       All questions were answered. The patient knows to call the clinic with any problems, questions or concerns. No barriers to learning was detected.  I spent 15 minutes counseling the patient face to face. The total time spent in the appointment was 20 minutes and more than 50% was on counseling and review of test results  Heath Lark, MD 09/07/2018 8:53 AM

## 2018-09-07 NOTE — Assessment & Plan Note (Signed)
The patient has gained almost 4 pounds since I saw him 3 months ago He has significant bilateral lower extremity edema He told me he is on a diuretic therapy I recommend close follow-up with his primary care doctor for medication adjustment if needed.

## 2018-09-07 NOTE — Telephone Encounter (Signed)
I could not reach regarding schedule I will mail

## 2018-09-12 DIAGNOSIS — H5202 Hypermetropia, left eye: Secondary | ICD-10-CM | POA: Diagnosis not present

## 2018-09-12 DIAGNOSIS — H524 Presbyopia: Secondary | ICD-10-CM | POA: Diagnosis not present

## 2018-09-12 DIAGNOSIS — Z0100 Encounter for examination of eyes and vision without abnormal findings: Secondary | ICD-10-CM | POA: Diagnosis not present

## 2018-09-12 DIAGNOSIS — H5211 Myopia, right eye: Secondary | ICD-10-CM | POA: Diagnosis not present

## 2018-09-17 ENCOUNTER — Other Ambulatory Visit: Payer: Self-pay

## 2018-09-17 DIAGNOSIS — Z20822 Contact with and (suspected) exposure to covid-19: Secondary | ICD-10-CM

## 2018-09-19 LAB — NOVEL CORONAVIRUS, NAA: SARS-CoV-2, NAA: NOT DETECTED

## 2018-09-27 MED FILL — IMBRUVICA 280 MG TAB: 280 | 28 days supply | Qty: 28 | Fill #2

## 2018-10-25 MED FILL — IMBRUVICA 280 MG TAB: 280 | 28 days supply | Qty: 28 | Fill #3

## 2018-11-15 DIAGNOSIS — R69 Illness, unspecified: Secondary | ICD-10-CM | POA: Diagnosis not present

## 2018-11-22 MED FILL — IMBRUVICA 280 MG TAB: 280 | 28 days supply | Qty: 28 | Fill #4

## 2018-12-08 ENCOUNTER — Other Ambulatory Visit: Payer: Self-pay

## 2018-12-08 ENCOUNTER — Inpatient Hospital Stay: Payer: Medicare HMO | Attending: Hematology and Oncology | Admitting: Hematology and Oncology

## 2018-12-08 ENCOUNTER — Telehealth: Payer: Self-pay | Admitting: Hematology and Oncology

## 2018-12-08 ENCOUNTER — Inpatient Hospital Stay: Payer: Medicare HMO

## 2018-12-08 ENCOUNTER — Encounter: Payer: Self-pay | Admitting: Hematology and Oncology

## 2018-12-08 DIAGNOSIS — C9110 Chronic lymphocytic leukemia of B-cell type not having achieved remission: Secondary | ICD-10-CM

## 2018-12-08 DIAGNOSIS — D630 Anemia in neoplastic disease: Secondary | ICD-10-CM | POA: Diagnosis not present

## 2018-12-08 DIAGNOSIS — N189 Chronic kidney disease, unspecified: Secondary | ICD-10-CM | POA: Diagnosis not present

## 2018-12-08 DIAGNOSIS — D72820 Lymphocytosis (symptomatic): Secondary | ICD-10-CM | POA: Insufficient documentation

## 2018-12-08 DIAGNOSIS — R600 Localized edema: Secondary | ICD-10-CM | POA: Diagnosis not present

## 2018-12-08 LAB — CBC WITH DIFFERENTIAL/PLATELET
Abs Immature Granulocytes: 0.03 10*3/uL (ref 0.00–0.07)
Basophils Absolute: 0.1 10*3/uL (ref 0.0–0.1)
Basophils Relative: 1 %
Eosinophils Absolute: 0.1 10*3/uL (ref 0.0–0.5)
Eosinophils Relative: 1 %
HCT: 31.5 % — ABNORMAL LOW (ref 39.0–52.0)
Hemoglobin: 10 g/dL — ABNORMAL LOW (ref 13.0–17.0)
Immature Granulocytes: 0 %
Lymphocytes Relative: 52 %
Lymphs Abs: 5.1 10*3/uL — ABNORMAL HIGH (ref 0.7–4.0)
MCH: 26 pg (ref 26.0–34.0)
MCHC: 31.7 g/dL (ref 30.0–36.0)
MCV: 81.8 fL (ref 80.0–100.0)
Monocytes Absolute: 0.4 10*3/uL (ref 0.1–1.0)
Monocytes Relative: 4 %
Neutro Abs: 4 10*3/uL (ref 1.7–7.7)
Neutrophils Relative %: 42 %
Platelets: 268 10*3/uL (ref 150–400)
RBC: 3.85 MIL/uL — ABNORMAL LOW (ref 4.22–5.81)
RDW: 18.1 % — ABNORMAL HIGH (ref 11.5–15.5)
WBC: 9.7 10*3/uL (ref 4.0–10.5)
nRBC: 0 % (ref 0.0–0.2)

## 2018-12-08 LAB — COMPREHENSIVE METABOLIC PANEL
ALT: 9 U/L (ref 0–44)
AST: 16 U/L (ref 15–41)
Albumin: 3.2 g/dL — ABNORMAL LOW (ref 3.5–5.0)
Alkaline Phosphatase: 38 U/L (ref 38–126)
Anion gap: 10 (ref 5–15)
BUN: 26 mg/dL — ABNORMAL HIGH (ref 8–23)
CO2: 22 mmol/L (ref 22–32)
Calcium: 8.6 mg/dL — ABNORMAL LOW (ref 8.9–10.3)
Chloride: 107 mmol/L (ref 98–111)
Creatinine, Ser: 1.14 mg/dL (ref 0.61–1.24)
GFR calc Af Amer: >60 mL/min (ref >60)
GFR calc non Af Amer: 58 mL/min — ABNORMAL LOW (ref >60)
Glucose, Bld: 144 mg/dL — ABNORMAL HIGH (ref 70–99)
Potassium: 4.4 mmol/L (ref 3.5–5.1)
Sodium: 139 mmol/L (ref 135–145)
Total Bilirubin: 0.2 mg/dL — ABNORMAL LOW (ref 0.3–1.2)
Total Protein: 6 g/dL — ABNORMAL LOW (ref 6.5–8.1)

## 2018-12-08 NOTE — Assessment & Plan Note (Signed)
Anemia is stable/improving It is likely related to chronic kidney disease and his underlying bone marrow disorder He is not symptomatic Observe only 

## 2018-12-08 NOTE — Telephone Encounter (Signed)
Scheduled appt per 11/12 sch message - unable to reach pt . Left message with appt date and time

## 2018-12-08 NOTE — Assessment & Plan Note (Signed)
He has chronic bilateral lower extremity edema He told me he is on a diuretic therapy I recommend close follow-up with his primary care doctor for medication adjustment if needed.

## 2018-12-08 NOTE — Assessment & Plan Note (Signed)
He tolerated treatment very well Lymphocytosis is improving/stable His hemoglobin is stable We will continue treatment without dose adjustment I will see him back 3 months for follow-up 

## 2018-12-08 NOTE — Progress Notes (Signed)
[place] OFFICE PROGRESS NOTE  Patient Care Team: Jani Gravel, MD as PCP - General (Internal Medicine) Heath Lark, MD as Consulting Physician (Hematology and Oncology)  ASSESSMENT & PLAN:  CLL (chronic lymphocytic leukemia) ([name]) He tolerated treatment very well Lymphocytosis is improving/stable His hemoglobin is stable We will continue treatment without dose adjustment I will see him back 3 months for follow-up  Anemia in neoplastic disease Anemia is stable/improving It is likely related to chronic kidney disease and his underlying bone marrow disorder He is not symptomatic Observe only  Bilateral leg edema He has chronic bilateral lower extremity edema He told me he is on a diuretic therapy I recommend close follow-up with his primary care doctor for medication adjustment if needed.   No orders of the defined types were placed in this encounter.   INTERVAL HISTORY: Please see below for problem oriented charting. He returns for further follow-up He tolerated treatment well No recent bruising or diarrhea No recent infection, fever or chills He has stable chronic bilateral lower extremity edema  SUMMARY OF ONCOLOGIC HISTORY: Oncology History  CLL (chronic lymphocytic leukemia) ([place])  04/03/2008 Pathology Results   Case #: [id number] FLow cytomtry confirmed CLL   10/11/2014 Tumor Marker   FISH analysis showed trisomy 12 abnormality   04/29/2015 -  Chemotherapy   He started taking Ibrutinib   04/29/2015 Imaging   Ct scan showed mild lymphadenopathy and splenomegaly   05/13/2015 Adverse Reaction   Due to neutropenia, dose of Ibrutinib is reduced to 280 mg daily   01/08/2016 Imaging   Response to therapy. Resolution of thoracic and abdominal adenopathy. Resolution of splenomegaly. 2. No new or progressive disease. 3. Small bowel containing left inguinal hernia and right-sided hydrocele, as before. 4. Esophageal air fluid level suggests dysmotility or  gastroesophageal reflux. 5. Possible constipation. 6. Suspect hepatic hemangiomas, similar.   10/09/2016 Imaging   No findings suspicious for active lymphomatous involvement.  No abdominopelvic lymphadenopathy. Spleen is normal in size.  Moderate to large left inguinal/scrotal hernia containing small bowel and fat. No evidence of bowel obstruction.  Additional stable ancillary findings as above.   04/26/2017 Imaging   1. No lymphadenopathy in the chest, abdomen, or pelvis. 2. Mild circumferential bladder wall thickening. Incomplete distention may contribute to this appearance, but bladder infection is also consideration. 3. Stable appearance small hepatic lesions. Imaging features most suggestive of benign cavernous hemangiomas. 4. Stable tiny low-density renal lesions compatible with cysts. 5. Large left groin hernia contains numerous small bowel loops without complicating features. 6. Right-sided hydrocele.   11/15/2017 Imaging   Large recurrent left inguinal hernia with extension of multiple small bowel loops into the inguinal canal and scrotum on the left. Proximal small bowel dilatation is noted consistent with a degree of incarceration.  Bilateral small effusions as well as patchy bibasilar infiltrates.    12/14/2017 Adverse Reaction   Ibrutinib placed on hold     REVIEW OF SYSTEMS:   Constitutional: Denies fevers, chills or abnormal weight loss Eyes: Denies blurriness of vision Ears, nose, mouth, throat, and face: Denies mucositis or sore throat Respiratory: Denies cough, dyspnea or wheezes Cardiovascular: Denies palpitation, chest discomfort Gastrointestinal:  Denies nausea, heartburn or change in bowel habits Skin: Denies abnormal skin rashes Lymphatics: Denies new lymphadenopathy or easy bruising Neurological:Denies numbness, tingling or new weaknesses Behavioral/Psych: Mood is stable, no new changes  All other systems were reviewed with the patient and are  negative.  I have reviewed the past medical history, past  surgical history, social history and family history with the patient and they are unchanged from previous note.  ALLERGIES:  has No Known Allergies.  MEDICATIONS:  Current Outpatient Medications  Medication Sig Dispense Refill  . acetaminophen (TYLENOL) 650 MG CR tablet Take 1 tablet (650 mg total) by mouth 2 (two) times daily as needed for pain. 30 tablet 0  . aspirin EC 81 MG tablet Take 81 mg by mouth daily.    . Capsaicin (ASPERCREME PAIN RELIEF PATCH EX) Apply 1 patch topically daily. Apply 1 patch to left shoulder 12 hrs/day. On at 8 am, off at 8 pm    . Cholecalciferol (VITAMIN D3) 1000 units CAPS Take 1,000 Units by mouth 2 (two) times daily.     . collagenase (SANTYL) ointment Apply 1 application topically daily.    . Cyanocobalamin (VITAMIN B 12 PO) Take 1,000 mcg by mouth daily.    Marland Kitchen gabapentin (NEURONTIN) 100 MG capsule Take 100 mg by mouth 3 (three) times daily.    . IMBRUVICA 280 MG TABS TAKE 1 TABLET (280MG ) BY MOUTH DAILY. 28 tablet 11  . insulin NPH-regular Human (70-30) 100 UNIT/ML injection Inject 15 Units into the skin 2 (two) times daily with a meal.    . mirtazapine (REMERON) 15 MG tablet Take 1 tablet (15 mg total) by mouth at bedtime.    . Multiple Vitamin (MULTIVITAMIN WITH MINERALS) TABS tablet Take 1 tablet by mouth daily.    . Nutritional Supplements (GLUCERNA 1.0 CAL) LIQD Take 30 Cans by mouth 2 (two) times daily.    Marland Kitchen oxyCODONE (OXY IR/ROXICODONE) 5 MG immediate release tablet Take 1 tablet (5 mg total) by mouth every 6 (six) hours as needed for moderate pain, severe pain or breakthrough pain. 5 tablet 0  . Polyethyl Glycol-Propyl Glycol (SYSTANE OP) Place 1 drop into both eyes 2 (two) times daily.    . polyethylene glycol (MIRALAX / GLYCOLAX) packet Take 17 g by mouth daily. 14 each 0  . tamsulosin (FLOMAX) 0.4 MG CAPS capsule Take 0.4 mg by mouth daily.     No current facility-administered medications  for this visit.     PHYSICAL EXAMINATION: ECOG PERFORMANCE STATUS: 1 - Symptomatic but completely ambulatory  Vitals:   12/08/18 0843  BP: (!) 122/53  Pulse: 74  Resp: 18  Temp: 97.9 F (36.6 C)  SpO2: 100%   Filed Weights   12/08/18 0843  Weight: 198 lb 14.4 oz (90.2 kg)    GENERAL:alert, no distress and comfortable SKIN: skin color, texture, turgor are normal, no rashes or significant lesions EYES: normal, Conjunctiva are pink and non-injected, sclera clear OROPHARYNX:no exudate, no erythema and lips, buccal mucosa, and tongue normal  NECK: supple, thyroid normal size, non-tender, without nodularity LYMPH:  no palpable lymphadenopathy in the cervical, axillary or inguinal LUNGS: clear to auscultation and percussion with normal breathing effort HEART: regular rate & rhythm and no murmurs with chronic stable bilateral lower extremity edema ABDOMEN:abdomen soft, non-tender and normal bowel sounds Musculoskeletal:no cyanosis of digits and no clubbing  NEURO: alert & oriented x 3 with fluent speech, no focal motor/sensory deficits  LABORATORY DATA:  I have reviewed the data as listed    Component Value Date/Time   NA 139 12/08/2018 0828   NA 140 10/09/2016 1106   K 4.4 12/08/2018 0828   K 4.7 10/09/2016 1106   CL 107 12/08/2018 0828   CL 104 03/28/2012 1426   CO2 22 12/08/2018 0828   CO2 28 10/09/2016 1106  GLUCOSE 144 (H) 12/08/2018 0828   GLUCOSE 127 10/09/2016 1106   GLUCOSE 171 (H) 03/28/2012 1426   BUN 26 (H) 12/08/2018 0828   BUN 20.8 10/09/2016 1106   CREATININE 1.14 12/08/2018 0828   CREATININE 1.15 09/06/2018 1016   CREATININE 1.1 10/09/2016 1106   CALCIUM 8.6 (L) 12/08/2018 0828   CALCIUM 9.1 10/09/2016 1106   PROT 6.0 (L) 12/08/2018 0828   PROT 5.8 (L) 10/09/2016 1106   ALBUMIN 3.2 (L) 12/08/2018 0828   ALBUMIN 3.1 (L) 10/09/2016 1106   AST 16 12/08/2018 0828   AST 18 09/06/2018 1016   AST 17 10/09/2016 1106   ALT 9 12/08/2018 0828   ALT 12  09/06/2018 1016   ALT 8 10/09/2016 1106   ALKPHOS 38 12/08/2018 0828   ALKPHOS 46 10/09/2016 1106   BILITOT 0.2 (L) 12/08/2018 0828   BILITOT 0.3 09/06/2018 1016   BILITOT 0.23 10/09/2016 1106   GFRNONAA 58 (L) 12/08/2018 0828   GFRNONAA 57 (L) 09/06/2018 1016   GFRAA >60 12/08/2018 0828   GFRAA >60 09/06/2018 1016    No results found for: SPEP, UPEP  Lab Results  Component Value Date   WBC 9.7 12/08/2018   NEUTROABS 4.0 12/08/2018   HGB 10.0 (L) 12/08/2018   HCT 31.5 (L) 12/08/2018   MCV 81.8 12/08/2018   PLT 268 12/08/2018      Chemistry      Component Value Date/Time   NA 139 12/08/2018 0828   NA 140 10/09/2016 1106   K 4.4 12/08/2018 0828   K 4.7 10/09/2016 1106   CL 107 12/08/2018 0828   CL 104 03/28/2012 1426   CO2 22 12/08/2018 0828   CO2 28 10/09/2016 1106   BUN 26 (H) 12/08/2018 0828   BUN 20.8 10/09/2016 1106   CREATININE 1.14 12/08/2018 0828   CREATININE 1.15 09/06/2018 1016   CREATININE 1.1 10/09/2016 1106      Component Value Date/Time   CALCIUM 8.6 (L) 12/08/2018 0828   CALCIUM 9.1 10/09/2016 1106   ALKPHOS 38 12/08/2018 0828   ALKPHOS 46 10/09/2016 1106   AST 16 12/08/2018 0828   AST 18 09/06/2018 1016   AST 17 10/09/2016 1106   ALT 9 12/08/2018 0828   ALT 12 09/06/2018 1016   ALT 8 10/09/2016 1106   BILITOT 0.2 (L) 12/08/2018 0828   BILITOT 0.3 09/06/2018 1016   BILITOT 0.23 10/09/2016 1106      All questions were answered. The patient knows to call the clinic with any problems, questions or concerns. No barriers to learning was detected.  I spent 10 minutes counseling the patient face to face. The total time spent in the appointment was 15 minutes and more than 50% was on counseling and review of test results  Heath Lark, MD 12/08/2018 10:41 AM

## 2018-12-12 DIAGNOSIS — M25511 Pain in right shoulder: Secondary | ICD-10-CM | POA: Diagnosis not present

## 2018-12-12 DIAGNOSIS — M25512 Pain in left shoulder: Secondary | ICD-10-CM | POA: Diagnosis not present

## 2018-12-20 MED FILL — IMBRUVICA 280 MG TAB: 280 | 28 days supply | Qty: 28 | Fill #5

## 2018-12-21 DIAGNOSIS — G4700 Insomnia, unspecified: Secondary | ICD-10-CM | POA: Diagnosis not present

## 2018-12-21 DIAGNOSIS — J309 Allergic rhinitis, unspecified: Secondary | ICD-10-CM | POA: Diagnosis not present

## 2018-12-21 DIAGNOSIS — N400 Enlarged prostate without lower urinary tract symptoms: Secondary | ICD-10-CM | POA: Diagnosis not present

## 2018-12-21 DIAGNOSIS — E119 Type 2 diabetes mellitus without complications: Secondary | ICD-10-CM | POA: Diagnosis not present

## 2018-12-21 DIAGNOSIS — G8929 Other chronic pain: Secondary | ICD-10-CM | POA: Diagnosis not present

## 2018-12-21 DIAGNOSIS — M1990 Unspecified osteoarthritis, unspecified site: Secondary | ICD-10-CM | POA: Diagnosis not present

## 2018-12-21 DIAGNOSIS — R69 Illness, unspecified: Secondary | ICD-10-CM | POA: Diagnosis not present

## 2018-12-21 DIAGNOSIS — C9590 Leukemia, unspecified not having achieved remission: Secondary | ICD-10-CM | POA: Diagnosis not present

## 2018-12-21 DIAGNOSIS — E669 Obesity, unspecified: Secondary | ICD-10-CM | POA: Diagnosis not present

## 2018-12-21 DIAGNOSIS — Z794 Long term (current) use of insulin: Secondary | ICD-10-CM | POA: Diagnosis not present

## 2018-12-28 DIAGNOSIS — N3001 Acute cystitis with hematuria: Secondary | ICD-10-CM | POA: Diagnosis not present

## 2018-12-28 DIAGNOSIS — R399 Unspecified symptoms and signs involving the genitourinary system: Secondary | ICD-10-CM | POA: Diagnosis not present

## 2018-12-28 DIAGNOSIS — N390 Urinary tract infection, site not specified: Secondary | ICD-10-CM | POA: Diagnosis not present

## 2019-01-17 MED FILL — IMBRUVICA 280 MG TAB: 280 | 28 days supply | Qty: 28 | Fill #6

## 2019-02-15 DIAGNOSIS — R69 Illness, unspecified: Secondary | ICD-10-CM | POA: Diagnosis not present

## 2019-02-15 MED FILL — IMBRUVICA 280 MG TAB: 280 | 28 days supply | Qty: 28 | Fill #7

## 2019-02-27 DIAGNOSIS — E7800 Pure hypercholesterolemia, unspecified: Secondary | ICD-10-CM | POA: Diagnosis not present

## 2019-02-27 DIAGNOSIS — Z79899 Other long term (current) drug therapy: Secondary | ICD-10-CM | POA: Diagnosis not present

## 2019-02-27 DIAGNOSIS — N390 Urinary tract infection, site not specified: Secondary | ICD-10-CM | POA: Diagnosis not present

## 2019-02-27 DIAGNOSIS — I10 Essential (primary) hypertension: Secondary | ICD-10-CM | POA: Diagnosis not present

## 2019-02-27 DIAGNOSIS — R739 Hyperglycemia, unspecified: Secondary | ICD-10-CM | POA: Diagnosis not present

## 2019-02-28 DIAGNOSIS — D649 Anemia, unspecified: Secondary | ICD-10-CM | POA: Diagnosis not present

## 2019-02-28 DIAGNOSIS — Z79899 Other long term (current) drug therapy: Secondary | ICD-10-CM | POA: Diagnosis not present

## 2019-03-06 DIAGNOSIS — N3020 Other chronic cystitis without hematuria: Secondary | ICD-10-CM | POA: Diagnosis not present

## 2019-03-06 DIAGNOSIS — Z0000 Encounter for general adult medical examination without abnormal findings: Secondary | ICD-10-CM | POA: Diagnosis not present

## 2019-03-09 DIAGNOSIS — I959 Hypotension, unspecified: Secondary | ICD-10-CM | POA: Diagnosis not present

## 2019-03-09 DIAGNOSIS — Z794 Long term (current) use of insulin: Secondary | ICD-10-CM | POA: Diagnosis not present

## 2019-03-09 DIAGNOSIS — M1990 Unspecified osteoarthritis, unspecified site: Secondary | ICD-10-CM | POA: Diagnosis not present

## 2019-03-09 DIAGNOSIS — C9590 Leukemia, unspecified not having achieved remission: Secondary | ICD-10-CM | POA: Diagnosis not present

## 2019-03-09 DIAGNOSIS — I10 Essential (primary) hypertension: Secondary | ICD-10-CM | POA: Diagnosis not present

## 2019-03-09 DIAGNOSIS — H547 Unspecified visual loss: Secondary | ICD-10-CM | POA: Diagnosis not present

## 2019-03-09 DIAGNOSIS — K5900 Constipation, unspecified: Secondary | ICD-10-CM | POA: Diagnosis not present

## 2019-03-09 DIAGNOSIS — E1142 Type 2 diabetes mellitus with diabetic polyneuropathy: Secondary | ICD-10-CM | POA: Diagnosis not present

## 2019-03-09 DIAGNOSIS — K219 Gastro-esophageal reflux disease without esophagitis: Secondary | ICD-10-CM | POA: Diagnosis not present

## 2019-03-09 DIAGNOSIS — G8929 Other chronic pain: Secondary | ICD-10-CM | POA: Diagnosis not present

## 2019-03-13 ENCOUNTER — Inpatient Hospital Stay: Payer: Medicare HMO

## 2019-03-13 ENCOUNTER — Encounter: Payer: Self-pay | Admitting: Hematology and Oncology

## 2019-03-13 ENCOUNTER — Telehealth: Payer: Self-pay | Admitting: Hematology and Oncology

## 2019-03-13 ENCOUNTER — Other Ambulatory Visit: Payer: Self-pay

## 2019-03-13 ENCOUNTER — Inpatient Hospital Stay: Payer: Medicare HMO | Attending: Hematology and Oncology | Admitting: Hematology and Oncology

## 2019-03-13 DIAGNOSIS — Z79899 Other long term (current) drug therapy: Secondary | ICD-10-CM | POA: Insufficient documentation

## 2019-03-13 DIAGNOSIS — C9110 Chronic lymphocytic leukemia of B-cell type not having achieved remission: Secondary | ICD-10-CM

## 2019-03-13 DIAGNOSIS — R600 Localized edema: Secondary | ICD-10-CM | POA: Diagnosis not present

## 2019-03-13 DIAGNOSIS — N189 Chronic kidney disease, unspecified: Secondary | ICD-10-CM | POA: Diagnosis not present

## 2019-03-13 DIAGNOSIS — D630 Anemia in neoplastic disease: Secondary | ICD-10-CM | POA: Diagnosis not present

## 2019-03-13 LAB — COMPREHENSIVE METABOLIC PANEL
ALT: 13 U/L (ref 0–44)
AST: 17 U/L (ref 15–41)
Albumin: 3.2 g/dL — ABNORMAL LOW (ref 3.5–5.0)
Alkaline Phosphatase: 43 U/L (ref 38–126)
Anion gap: 8 (ref 5–15)
BUN: 37 mg/dL — ABNORMAL HIGH (ref 8–23)
CO2: 24 mmol/L (ref 22–32)
Calcium: 8.4 mg/dL — ABNORMAL LOW (ref 8.9–10.3)
Chloride: 108 mmol/L (ref 98–111)
Creatinine, Ser: 1.17 mg/dL (ref 0.61–1.24)
GFR calc Af Amer: >60 mL/min (ref >60)
GFR calc non Af Amer: 56 mL/min — ABNORMAL LOW (ref >60)
Glucose, Bld: 127 mg/dL — ABNORMAL HIGH (ref 70–99)
Potassium: 4.4 mmol/L (ref 3.5–5.1)
Sodium: 140 mmol/L (ref 135–145)
Total Bilirubin: <0.2 mg/dL — ABNORMAL LOW (ref 0.3–1.2)
Total Protein: 6.1 g/dL — ABNORMAL LOW (ref 6.5–8.1)

## 2019-03-13 LAB — CBC WITH DIFFERENTIAL/PLATELET
Abs Immature Granulocytes: 0.05 10*3/uL (ref 0.00–0.07)
Basophils Absolute: 0.1 10*3/uL (ref 0.0–0.1)
Basophils Relative: 1 %
Eosinophils Absolute: 0.1 10*3/uL (ref 0.0–0.5)
Eosinophils Relative: 1 %
HCT: 34.9 % — ABNORMAL LOW (ref 39.0–52.0)
Hemoglobin: 11.2 g/dL — ABNORMAL LOW (ref 13.0–17.0)
Immature Granulocytes: 1 %
Lymphocytes Relative: 48 %
Lymphs Abs: 4.3 10*3/uL — ABNORMAL HIGH (ref 0.7–4.0)
MCH: 28.4 pg (ref 26.0–34.0)
MCHC: 32.1 g/dL (ref 30.0–36.0)
MCV: 88.4 fL (ref 80.0–100.0)
Monocytes Absolute: 0.5 10*3/uL (ref 0.1–1.0)
Monocytes Relative: 5 %
Neutro Abs: 4 10*3/uL (ref 1.7–7.7)
Neutrophils Relative %: 44 %
Platelets: 267 10*3/uL (ref 150–400)
RBC: 3.95 MIL/uL — ABNORMAL LOW (ref 4.22–5.81)
RDW: 16.5 % — ABNORMAL HIGH (ref 11.5–15.5)
WBC: 9 10*3/uL (ref 4.0–10.5)
nRBC: 0 % (ref 0.0–0.2)

## 2019-03-13 NOTE — Assessment & Plan Note (Signed)
He has chronic bilateral lower extremity edema He told me he is on a diuretic therapy I recommend close follow-up with his primary care doctor for medication adjustment if needed.

## 2019-03-13 NOTE — Telephone Encounter (Signed)
Scheduled appt per schedule msg. Pt didn't answer. Left VM with appt date and time. Sent reminder letter.

## 2019-03-13 NOTE — Assessment & Plan Note (Signed)
Anemia is stable/improving It is likely related to chronic kidney disease and his underlying bone marrow disorder He is not symptomatic Observe only

## 2019-03-13 NOTE — Progress Notes (Signed)
[place] OFFICE PROGRESS NOTE  Patient Care Team: Jani Gravel, MD as PCP - General (Internal Medicine) Heath Lark, MD as Consulting Physician (Hematology and Oncology)  ASSESSMENT & PLAN:  CLL (chronic lymphocytic leukemia) ([place]) He tolerated treatment very well Lymphocytosis is improving/stable His hemoglobin is stable/improving We will continue treatment without dose adjustment I will see him back 3 months for follow-up  Anemia in neoplastic disease Anemia is stable/improving It is likely related to chronic kidney disease and his underlying bone marrow disorder He is not symptomatic Observe only  Bilateral leg edema He has chronic bilateral lower extremity edema He told me he is on a diuretic therapy I recommend close follow-up with his primary care doctor for medication adjustment if needed.   No orders of the defined types were placed in this encounter.   All questions were answered. The patient knows to call the clinic with any problems, questions or concerns. The total time spent in the appointment was 10 minutes encounter with patients including review of chart and various tests results, discussions about plan of care and coordination of care plan   Heath Lark, MD 03/13/2019 12:30 PM  INTERVAL HISTORY: Please see below for problem oriented charting. He returns for further follow-up He is doing well with his ibrutinib No new side effects No recent bruising or abnormal heart palpitation No recent infection, fever or chills  SUMMARY OF ONCOLOGIC HISTORY: Oncology History  CLL (chronic lymphocytic leukemia) ([place])  04/03/2008 Pathology Results   Case #: [id number] FLow cytomtry confirmed CLL   10/11/2014 Tumor Marker   FISH analysis showed trisomy 12 abnormality   04/29/2015 -  Chemotherapy   He started taking Ibrutinib   04/29/2015 Imaging   Ct scan showed mild lymphadenopathy and splenomegaly   05/13/2015 Adverse Reaction   Due to neutropenia,  dose of Ibrutinib is reduced to 280 mg daily   01/08/2016 Imaging   Response to therapy. Resolution of thoracic and abdominal adenopathy. Resolution of splenomegaly. 2. No new or progressive disease. 3. Small bowel containing left inguinal hernia and right-sided hydrocele, as before. 4. Esophageal air fluid level suggests dysmotility or gastroesophageal reflux. 5. Possible constipation. 6. Suspect hepatic hemangiomas, similar.   10/09/2016 Imaging   No findings suspicious for active lymphomatous involvement.  No abdominopelvic lymphadenopathy. Spleen is normal in size.  Moderate to large left inguinal/scrotal hernia containing small bowel and fat. No evidence of bowel obstruction.  Additional stable ancillary findings as above.   04/26/2017 Imaging   1. No lymphadenopathy in the chest, abdomen, or pelvis. 2. Mild circumferential bladder wall thickening. Incomplete distention may contribute to this appearance, but bladder infection is also consideration. 3. Stable appearance small hepatic lesions. Imaging features most suggestive of benign cavernous hemangiomas. 4. Stable tiny low-density renal lesions compatible with cysts. 5. Large left groin hernia contains numerous small bowel loops without complicating features. 6. Right-sided hydrocele.   11/15/2017 Imaging   Large recurrent left inguinal hernia with extension of multiple small bowel loops into the inguinal canal and scrotum on the left. Proximal small bowel dilatation is noted consistent with a degree of incarceration.  Bilateral small effusions as well as patchy bibasilar infiltrates.    12/14/2017 Adverse Reaction   Ibrutinib placed on hold     REVIEW OF SYSTEMS:   Constitutional: Denies fevers, chills or abnormal weight loss Eyes: Denies blurriness of vision Ears, nose, mouth, throat, and face: Denies mucositis or sore throat Respiratory: Denies cough, dyspnea or wheezes Cardiovascular: Denies palpitation, chest  discomfort  Gastrointestinal:  Denies nausea, heartburn or change in bowel habits Skin: Denies abnormal skin rashes Lymphatics: Denies new lymphadenopathy or easy bruising Neurological:Denies numbness, tingling or new weaknesses Behavioral/Psych: Mood is stable, no new changes  All other systems were reviewed with the patient and are negative.  I have reviewed the past medical history, past surgical history, social history and family history with the patient and they are unchanged from previous note.  ALLERGIES:  has No Known Allergies.  MEDICATIONS:  Current Outpatient Medications  Medication Sig Dispense Refill  . acetaminophen (TYLENOL) 650 MG CR tablet Take 1 tablet (650 mg total) by mouth 2 (two) times daily as needed for pain. 30 tablet 0  . aspirin EC 81 MG tablet Take 81 mg by mouth daily.    . Capsaicin (ASPERCREME PAIN RELIEF PATCH EX) Apply 1 patch topically daily. Apply 1 patch to left shoulder 12 hrs/day. On at 8 am, off at 8 pm    . Cholecalciferol (VITAMIN D3) 1000 units CAPS Take 1,000 Units by mouth 2 (two) times daily.     . collagenase (SANTYL) ointment Apply 1 application topically daily.    . Cyanocobalamin (VITAMIN B 12 PO) Take 1,000 mcg by mouth daily.    Marland Kitchen gabapentin (NEURONTIN) 100 MG capsule Take 100 mg by mouth 3 (three) times daily.    . IMBRUVICA 280 MG TABS TAKE 1 TABLET (280MG ) BY MOUTH DAILY. 28 tablet 11  . insulin NPH-regular Human (70-30) 100 UNIT/ML injection Inject 15 Units into the skin 2 (two) times daily with a meal.    . mirtazapine (REMERON) 15 MG tablet Take 1 tablet (15 mg total) by mouth at bedtime.    . Multiple Vitamin (MULTIVITAMIN WITH MINERALS) TABS tablet Take 1 tablet by mouth daily.    . Nutritional Supplements (GLUCERNA 1.0 CAL) LIQD Take 30 Cans by mouth 2 (two) times daily.    Marland Kitchen oxyCODONE (OXY IR/ROXICODONE) 5 MG immediate release tablet Take 1 tablet (5 mg total) by mouth every 6 (six) hours as needed for moderate pain, severe pain  or breakthrough pain. 5 tablet 0  . Polyethyl Glycol-Propyl Glycol (SYSTANE OP) Place 1 drop into both eyes 2 (two) times daily.    . polyethylene glycol (MIRALAX / GLYCOLAX) packet Take 17 g by mouth daily. 14 each 0  . tamsulosin (FLOMAX) 0.4 MG CAPS capsule Take 0.4 mg by mouth daily.     No current facility-administered medications for this visit.    PHYSICAL EXAMINATION: ECOG PERFORMANCE STATUS: 1 - Symptomatic but completely ambulatory  Vitals:   03/13/19 1110  BP: (!) 104/40  Pulse: 80  Resp: 18  Temp: 97.7 F (36.5 C)  SpO2: 100%   Filed Weights   03/13/19 1110  Weight: 205 lb 3.2 oz (93.1 kg)    GENERAL:alert, no distress and comfortable HEART: regular rate & rhythm and no murmurs with stable bilateral lower extremity edema NEURO: alert & oriented x 3 with fluent speech, no focal motor/sensory deficits  LABORATORY DATA:  I have reviewed the data as listed    Component Value Date/Time   NA 140 03/13/2019 1026   NA 140 10/09/2016 1106   K 4.4 03/13/2019 1026   K 4.7 10/09/2016 1106   CL 108 03/13/2019 1026   CL 104 03/28/2012 1426   CO2 24 03/13/2019 1026   CO2 28 10/09/2016 1106   GLUCOSE 127 (H) 03/13/2019 1026   GLUCOSE 127 10/09/2016 1106   GLUCOSE 171 (H) 03/28/2012 1426  BUN 37 (H) 03/13/2019 1026   BUN 20.8 10/09/2016 1106   CREATININE 1.17 03/13/2019 1026   CREATININE 1.15 09/06/2018 1016   CREATININE 1.1 10/09/2016 1106   CALCIUM 8.4 (L) 03/13/2019 1026   CALCIUM 9.1 10/09/2016 1106   PROT 6.1 (L) 03/13/2019 1026   PROT 5.8 (L) 10/09/2016 1106   ALBUMIN 3.2 (L) 03/13/2019 1026   ALBUMIN 3.1 (L) 10/09/2016 1106   AST 17 03/13/2019 1026   AST 18 09/06/2018 1016   AST 17 10/09/2016 1106   ALT 13 03/13/2019 1026   ALT 12 09/06/2018 1016   ALT 8 10/09/2016 1106   ALKPHOS 43 03/13/2019 1026   ALKPHOS 46 10/09/2016 1106   BILITOT <0.2 (L) 03/13/2019 1026   BILITOT 0.3 09/06/2018 1016   BILITOT 0.23 10/09/2016 1106   GFRNONAA 56 (L)  03/13/2019 1026   GFRNONAA 57 (L) 09/06/2018 1016   GFRAA >60 03/13/2019 1026   GFRAA >60 09/06/2018 1016    No results found for: SPEP, UPEP  Lab Results  Component Value Date   WBC 9.0 03/13/2019   NEUTROABS 4.0 03/13/2019   HGB 11.2 (L) 03/13/2019   HCT 34.9 (L) 03/13/2019   MCV 88.4 03/13/2019   PLT 267 03/13/2019      Chemistry      Component Value Date/Time   NA 140 03/13/2019 1026   NA 140 10/09/2016 1106   K 4.4 03/13/2019 1026   K 4.7 10/09/2016 1106   CL 108 03/13/2019 1026   CL 104 03/28/2012 1426   CO2 24 03/13/2019 1026   CO2 28 10/09/2016 1106   BUN 37 (H) 03/13/2019 1026   BUN 20.8 10/09/2016 1106   CREATININE 1.17 03/13/2019 1026   CREATININE 1.15 09/06/2018 1016   CREATININE 1.1 10/09/2016 1106      Component Value Date/Time   CALCIUM 8.4 (L) 03/13/2019 1026   CALCIUM 9.1 10/09/2016 1106   ALKPHOS 43 03/13/2019 1026   ALKPHOS 46 10/09/2016 1106   AST 17 03/13/2019 1026   AST 18 09/06/2018 1016   AST 17 10/09/2016 1106   ALT 13 03/13/2019 1026   ALT 12 09/06/2018 1016   ALT 8 10/09/2016 1106   BILITOT <0.2 (L) 03/13/2019 1026   BILITOT 0.3 09/06/2018 1016   BILITOT 0.23 10/09/2016 1106

## 2019-03-13 NOTE — Assessment & Plan Note (Signed)
He tolerated treatment very well Lymphocytosis is improving/stable His hemoglobin is stable/improving We will continue treatment without dose adjustment I will see him back 3 months for follow-up

## 2019-03-15 ENCOUNTER — Telehealth: Payer: Self-pay

## 2019-03-15 MED FILL — IMBRUVICA 280 MG TAB: 280 | 28 days supply | Qty: 28 | Fill #8

## 2019-03-15 NOTE — Telephone Encounter (Signed)
Oral Oncology Patient Advocate Encounter   Was successful in securing patient a $10000 grant from [place] to provide copayment coverage for Imbruvica.  This will keep the out of pocket expense at $0.        The billing information is as follows and has been shared with [place].   Member ID: [id number] Group ID: [id number] RxBin: [id number] PCN: [id number] Dates of Eligibility: 03/15/19 through 03/14/20  Fund name:  CLL.  [place] Patient [place] Phone [phone] Fax [phone] 03/15/2019 2:28 PM

## 2019-04-04 DIAGNOSIS — N3021 Other chronic cystitis with hematuria: Secondary | ICD-10-CM | POA: Diagnosis not present

## 2019-04-04 DIAGNOSIS — M25611 Stiffness of right shoulder, not elsewhere classified: Secondary | ICD-10-CM | POA: Diagnosis not present

## 2019-04-04 DIAGNOSIS — M25612 Stiffness of left shoulder, not elsewhere classified: Secondary | ICD-10-CM | POA: Diagnosis not present

## 2019-04-04 DIAGNOSIS — Z13820 Encounter for screening for osteoporosis: Secondary | ICD-10-CM | POA: Diagnosis not present

## 2019-04-04 DIAGNOSIS — R413 Other amnesia: Secondary | ICD-10-CM | POA: Diagnosis not present

## 2019-04-10 DIAGNOSIS — M545 Low back pain: Secondary | ICD-10-CM | POA: Diagnosis not present

## 2019-04-10 DIAGNOSIS — Z79891 Long term (current) use of opiate analgesic: Secondary | ICD-10-CM | POA: Diagnosis not present

## 2019-04-10 DIAGNOSIS — G894 Chronic pain syndrome: Secondary | ICD-10-CM | POA: Diagnosis not present

## 2019-04-12 MED FILL — IMBRUVICA 280 MG TAB: 280 | 28 days supply | Qty: 28 | Fill #9

## 2019-04-19 DIAGNOSIS — M25511 Pain in right shoulder: Secondary | ICD-10-CM | POA: Diagnosis not present

## 2019-04-19 DIAGNOSIS — M25512 Pain in left shoulder: Secondary | ICD-10-CM | POA: Diagnosis not present

## 2019-04-25 DIAGNOSIS — M25512 Pain in left shoulder: Secondary | ICD-10-CM | POA: Diagnosis not present

## 2019-04-25 DIAGNOSIS — M25511 Pain in right shoulder: Secondary | ICD-10-CM | POA: Diagnosis not present

## 2019-04-28 DIAGNOSIS — M25511 Pain in right shoulder: Secondary | ICD-10-CM | POA: Diagnosis not present

## 2019-04-28 DIAGNOSIS — M25512 Pain in left shoulder: Secondary | ICD-10-CM | POA: Diagnosis not present

## 2019-05-01 DIAGNOSIS — M25512 Pain in left shoulder: Secondary | ICD-10-CM | POA: Diagnosis not present

## 2019-05-01 DIAGNOSIS — M25511 Pain in right shoulder: Secondary | ICD-10-CM | POA: Diagnosis not present

## 2019-05-08 DIAGNOSIS — M25512 Pain in left shoulder: Secondary | ICD-10-CM | POA: Diagnosis not present

## 2019-05-08 DIAGNOSIS — M25511 Pain in right shoulder: Secondary | ICD-10-CM | POA: Diagnosis not present

## 2019-05-11 ENCOUNTER — Telehealth: Payer: Self-pay

## 2019-05-11 MED FILL — IMBRUVICA 280 MG TAB: 280 | 28 days supply | Qty: 28 | Fill #10

## 2019-05-11 NOTE — Telephone Encounter (Signed)
Oral Oncology Patient Advocate Encounter  Received notification from [facility] that prior authorization for Imbruvica is required.  PA submitted on CoverMyMeds Key [id number] Status is pending  Oral Oncology Clinic will continue to follow.  [place] Patient [place] Phone [phone] Fax [phone] 05/11/2019 8:18 AM

## 2019-05-11 NOTE — Telephone Encounter (Signed)
Oral Oncology Patient Advocate Encounter  Prior Authorization for [name] has been approved.    PA# [id number] Effective dates: 01/27/19 through 01/26/20  Patients co-pay is $735.29  Oral Oncology Clinic will continue to follow.   [place] Patient [place] Phone [phone] Fax [phone] 05/11/2019 8:30 AM

## 2019-05-16 DIAGNOSIS — M25512 Pain in left shoulder: Secondary | ICD-10-CM | POA: Diagnosis not present

## 2019-05-16 DIAGNOSIS — M25511 Pain in right shoulder: Secondary | ICD-10-CM | POA: Diagnosis not present

## 2019-05-22 DIAGNOSIS — M25512 Pain in left shoulder: Secondary | ICD-10-CM | POA: Diagnosis not present

## 2019-05-22 DIAGNOSIS — M25511 Pain in right shoulder: Secondary | ICD-10-CM | POA: Diagnosis not present

## 2019-05-23 DIAGNOSIS — R69 Illness, unspecified: Secondary | ICD-10-CM | POA: Diagnosis not present

## 2019-05-24 DIAGNOSIS — M25511 Pain in right shoulder: Secondary | ICD-10-CM | POA: Diagnosis not present

## 2019-05-24 DIAGNOSIS — M25512 Pain in left shoulder: Secondary | ICD-10-CM | POA: Diagnosis not present

## 2019-05-31 DIAGNOSIS — M25511 Pain in right shoulder: Secondary | ICD-10-CM | POA: Diagnosis not present

## 2019-05-31 DIAGNOSIS — M25512 Pain in left shoulder: Secondary | ICD-10-CM | POA: Diagnosis not present

## 2019-06-07 DIAGNOSIS — M25512 Pain in left shoulder: Secondary | ICD-10-CM | POA: Diagnosis not present

## 2019-06-07 DIAGNOSIS — M25511 Pain in right shoulder: Secondary | ICD-10-CM | POA: Diagnosis not present

## 2019-06-12 ENCOUNTER — Encounter: Payer: Self-pay | Admitting: Hematology and Oncology

## 2019-06-12 ENCOUNTER — Telehealth: Payer: Self-pay | Admitting: Hematology and Oncology

## 2019-06-12 ENCOUNTER — Inpatient Hospital Stay: Payer: Medicare HMO | Attending: Hematology and Oncology | Admitting: Hematology and Oncology

## 2019-06-12 ENCOUNTER — Other Ambulatory Visit: Payer: Self-pay

## 2019-06-12 ENCOUNTER — Inpatient Hospital Stay: Payer: Medicare HMO

## 2019-06-12 DIAGNOSIS — D630 Anemia in neoplastic disease: Secondary | ICD-10-CM | POA: Diagnosis not present

## 2019-06-12 DIAGNOSIS — C9110 Chronic lymphocytic leukemia of B-cell type not having achieved remission: Secondary | ICD-10-CM | POA: Insufficient documentation

## 2019-06-12 DIAGNOSIS — Z79899 Other long term (current) drug therapy: Secondary | ICD-10-CM | POA: Diagnosis not present

## 2019-06-12 DIAGNOSIS — R600 Localized edema: Secondary | ICD-10-CM | POA: Diagnosis not present

## 2019-06-12 DIAGNOSIS — D649 Anemia, unspecified: Secondary | ICD-10-CM | POA: Insufficient documentation

## 2019-06-12 LAB — COMPREHENSIVE METABOLIC PANEL
ALT: 12 U/L (ref 0–44)
AST: 18 U/L (ref 15–41)
Albumin: 3.2 g/dL — ABNORMAL LOW (ref 3.5–5.0)
Alkaline Phosphatase: 55 U/L (ref 38–126)
Anion gap: 9 (ref 5–15)
BUN: 27 mg/dL — ABNORMAL HIGH (ref 8–23)
CO2: 22 mmol/L (ref 22–32)
Calcium: 8.5 mg/dL — ABNORMAL LOW (ref 8.9–10.3)
Chloride: 108 mmol/L (ref 98–111)
Creatinine, Ser: 1.22 mg/dL (ref 0.61–1.24)
GFR calc Af Amer: >60 mL/min (ref >60)
GFR calc non Af Amer: 53 mL/min — ABNORMAL LOW (ref >60)
Glucose, Bld: 137 mg/dL — ABNORMAL HIGH (ref 70–99)
Potassium: 4.2 mmol/L (ref 3.5–5.1)
Sodium: 139 mmol/L (ref 135–145)
Total Bilirubin: 0.4 mg/dL (ref 0.3–1.2)
Total Protein: 6.1 g/dL — ABNORMAL LOW (ref 6.5–8.1)

## 2019-06-12 LAB — CBC WITH DIFFERENTIAL/PLATELET
Abs Immature Granulocytes: 0.05 10*3/uL (ref 0.00–0.07)
Basophils Absolute: 0 10*3/uL (ref 0.0–0.1)
Basophils Relative: 0 %
Eosinophils Absolute: 0.1 10*3/uL (ref 0.0–0.5)
Eosinophils Relative: 1 %
HCT: 35 % — ABNORMAL LOW (ref 39.0–52.0)
Hemoglobin: 11.2 g/dL — ABNORMAL LOW (ref 13.0–17.0)
Immature Granulocytes: 1 %
Lymphocytes Relative: 53 %
Lymphs Abs: 4.9 10*3/uL — ABNORMAL HIGH (ref 0.7–4.0)
MCH: 28 pg (ref 26.0–34.0)
MCHC: 32 g/dL (ref 30.0–36.0)
MCV: 87.5 fL (ref 80.0–100.0)
Monocytes Absolute: 0.4 10*3/uL (ref 0.1–1.0)
Monocytes Relative: 5 %
Neutro Abs: 3.6 10*3/uL (ref 1.7–7.7)
Neutrophils Relative %: 40 %
Platelets: 254 10*3/uL (ref 150–400)
RBC: 4 MIL/uL — ABNORMAL LOW (ref 4.22–5.81)
RDW: 14.5 % (ref 11.5–15.5)
WBC: 9.2 10*3/uL (ref 4.0–10.5)
nRBC: 0 % (ref 0.0–0.2)

## 2019-06-12 NOTE — Assessment & Plan Note (Signed)
He tolerated treatment very well Lymphocytosis is improving/stable His hemoglobin is stable/improving We will continue treatment without dose adjustment I will see him back 3 months for follow-up I do not plan surveillance imaging studies unless he have signs or symptoms to suggest cancer progression

## 2019-06-12 NOTE — Progress Notes (Signed)
[name] OFFICE PROGRESS NOTE  Patient Care Team: Jani Gravel, MD as PCP - General (Internal Medicine) Heath Lark, MD as Consulting Physician (Hematology and Oncology)  ASSESSMENT & PLAN:  CLL (chronic lymphocytic leukemia) ([place]) He tolerated treatment very well Lymphocytosis is improving/stable His hemoglobin is stable/improving We will continue treatment without dose adjustment I will see him back 3 months for follow-up I do not plan surveillance imaging studies unless he have signs or symptoms to suggest cancer progression  Anemia in neoplastic disease Anemia is stable/improving It is likely related to chronic kidney disease and his underlying bone marrow disorder He is not symptomatic Observe only  Bilateral leg edema He has chronic bilateral lower extremity edema He told me he is on a diuretic therapy   No orders of the defined types were placed in this encounter.   All questions were answered. The patient knows to call the clinic with any problems, questions or concerns. The total time spent in the appointment was 20 minutes encounter with patients including review of chart and various tests results, discussions about plan of care and coordination of care plan   Heath Lark, MD 06/12/2019 11:16 AM  INTERVAL HISTORY: Please see below for problem oriented charting. 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  SUMMARY OF ONCOLOGIC HISTORY: Oncology History  CLL (chronic lymphocytic leukemia) ([place])  04/03/2008  Pathology Results   Case #: [id number] FLow cytomtry confirmed CLL   10/11/2014 Tumor Marker   FISH analysis showed trisomy 12 abnormality   04/29/2015 -  Chemotherapy   He started taking Ibrutinib   04/29/2015 Imaging   Ct scan showed mild lymphadenopathy and splenomegaly   05/13/2015 Adverse Reaction   Due to neutropenia, dose of Ibrutinib is reduced to 280 mg daily   01/08/2016 Imaging   Response to therapy. Resolution of thoracic and abdominal adenopathy. Resolution of splenomegaly. 2. No new or progressive disease. 3. Small bowel containing left inguinal hernia and right-sided hydrocele, as before. 4. Esophageal air fluid level suggests dysmotility or gastroesophageal reflux. 5. Possible constipation. 6. Suspect hepatic hemangiomas, similar.   10/09/2016 Imaging   No findings suspicious for active lymphomatous involvement.  No abdominopelvic lymphadenopathy. Spleen is normal in size.  Moderate to large left inguinal/scrotal hernia containing small bowel and fat. No evidence of bowel obstruction.  Additional stable ancillary findings as above.   04/26/2017 Imaging   1. No lymphadenopathy in the chest, abdomen, or pelvis. 2. Mild circumferential bladder wall thickening. Incomplete distention may contribute to this appearance, but bladder infection is also consideration. 3. Stable appearance small hepatic lesions. Imaging features most suggestive of benign cavernous hemangiomas. 4. Stable tiny low-density renal lesions compatible with cysts. 5. Large left groin hernia contains numerous small bowel loops without complicating features. 6. Right-sided hydrocele.   11/15/2017 Imaging   Large recurrent left inguinal hernia with extension of multiple small bowel loops into the inguinal canal and scrotum on the left. Proximal small bowel dilatation is noted consistent with a degree of incarceration.  Bilateral small effusions as well as patchy bibasilar infiltrates.    12/14/2017  Adverse  Reaction   Ibrutinib placed on hold     REVIEW OF SYSTEMS:   Constitutional: Denies fevers, chills or abnormal weight loss Eyes: Denies blurriness of vision Ears, nose, mouth, throat, and face: Denies mucositis or sore throat Respiratory: Denies cough, dyspnea or wheezes Cardiovascular: Denies palpitation, chest discomfort Gastrointestinal:  Denies nausea, heartburn or change in bowel habits Skin: Denies abnormal skin rashes Lymphatics: Denies new lymphadenopathy or easy bruising Neurological:Denies numbness, tingling or new weaknesses Behavioral/Psych: Mood is stable, no new changes  All other systems were reviewed with the patient and are negative.  I have reviewed the past medical history, past surgical history, social history and family history with the patient and they are unchanged from previous note.  ALLERGIES:  has No Known Allergies.  MEDICATIONS:  Current Outpatient Medications  Medication Sig Dispense Refill  . acetaminophen (TYLENOL) 650 MG CR tablet Take 1 tablet (650 mg total) by mouth 2 (two) times daily as needed for pain. 30 tablet 0  . aspirin EC 81 MG tablet Take 81 mg by mouth daily.    . Capsaicin (ASPERCREME PAIN RELIEF PATCH EX) Apply 1 patch topically daily. Apply 1 patch to left shoulder 12 hrs/day. On at 8 am, off at 8 pm    . Cholecalciferol (VITAMIN D3) 1000 units CAPS Take 1,000 Units by mouth 2 (two) times daily.     . collagenase (SANTYL) ointment Apply 1 application topically daily.    . Cyanocobalamin (VITAMIN B 12 PO) Take 1,000 mcg by mouth daily.    Marland Kitchen gabapentin (NEURONTIN) 100 MG capsule Take 100 mg by mouth 3 (three) times daily.    . IMBRUVICA 280 MG TABS TAKE 1 TABLET (280MG ) BY MOUTH DAILY. 28 tablet 11  . insulin NPH-regular Human (70-30) 100 UNIT/ML injection Inject 15 Units into the skin 2 (two) times daily with a meal.    . mirtazapine (REMERON) 15 MG tablet Take 1 tablet (15 mg total) by mouth at bedtime.    . Multiple Vitamin  (MULTIVITAMIN WITH MINERALS) TABS tablet Take 1 tablet by mouth daily.    . Nutritional Supplements (GLUCERNA 1.0 CAL) LIQD Take 30 Cans by mouth 2 (two) times daily.    Marland Kitchen oxyCODONE (OXY IR/ROXICODONE) 5 MG immediate release tablet Take 1 tablet (5 mg total) by mouth every 6 (six) hours as needed for moderate pain, severe pain or breakthrough pain. 5 tablet 0  . Polyethyl Glycol-Propyl Glycol (SYSTANE OP) Place 1 drop into both eyes 2 (two) times daily.    . polyethylene glycol (MIRALAX / GLYCOLAX) packet Take 17 g by mouth daily. 14 each 0  . tamsulosin (FLOMAX) 0.4 MG CAPS capsule Take 0.4 mg by mouth daily.     No current facility-administered medications for this visit.    PHYSICAL EXAMINATION: ECOG PERFORMANCE STATUS: 1 - Symptomatic but completely ambulatory  Vitals:   06/12/19 1034  BP: (!) 130/51  Pulse: 81  Resp: 18  Temp: 98.5 F (36.9 C)  SpO2: 100%   There were no vitals filed for this visit.  GENERAL:alert, no distress and comfortable SKIN: skin color, texture, turgor are normal, no rashes or significant lesions EYES: normal, Conjunctiva are pink and non-injected, sclera clear OROPHARYNX:no exudate, no erythema and lips, buccal mucosa, and tongue normal  NECK: supple, thyroid normal size, non-tender, without nodularity LYMPH:  no palpable lymphadenopathy in the cervical, axillary or inguinal LUNGS: clear to auscultation and percussion with normal breathing effort HEART: regular rate & rhythm and no murmurs with  moderate bilateral lower extremity edema ABDOMEN:abdomen soft, non-tender and normal bowel sounds Musculoskeletal:no cyanosis of digits and no clubbing  NEURO: alert & oriented x 3 with fluent speech, no focal motor/sensory deficits  LABORATORY DATA:  I have reviewed the data as listed    Component Value Date/Time   NA 139 06/12/2019 1019   NA 140 10/09/2016 1106   K 4.2 06/12/2019 1019   K 4.7 10/09/2016 1106   CL 108 06/12/2019 1019   CL 104  03/28/2012 1426   CO2 22 06/12/2019 1019   CO2 28 10/09/2016 1106   GLUCOSE 137 (H) 06/12/2019 1019   GLUCOSE 127 10/09/2016 1106   GLUCOSE 171 (H) 03/28/2012 1426   BUN 27 (H) 06/12/2019 1019   BUN 20.8 10/09/2016 1106   CREATININE 1.22 06/12/2019 1019   CREATININE 1.15 09/06/2018 1016   CREATININE 1.1 10/09/2016 1106   CALCIUM 8.5 (L) 06/12/2019 1019   CALCIUM 9.1 10/09/2016 1106   PROT 6.1 (L) 06/12/2019 1019   PROT 5.8 (L) 10/09/2016 1106   ALBUMIN 3.2 (L) 06/12/2019 1019   ALBUMIN 3.1 (L) 10/09/2016 1106   AST 18 06/12/2019 1019   AST 18 09/06/2018 1016   AST 17 10/09/2016 1106   ALT 12 06/12/2019 1019   ALT 12 09/06/2018 1016   ALT 8 10/09/2016 1106   ALKPHOS 55 06/12/2019 1019   ALKPHOS 46 10/09/2016 1106   BILITOT 0.4 06/12/2019 1019   BILITOT 0.3 09/06/2018 1016   BILITOT 0.23 10/09/2016 1106   GFRNONAA 53 (L) 06/12/2019 1019   GFRNONAA 57 (L) 09/06/2018 1016   GFRAA >60 06/12/2019 1019   GFRAA >60 09/06/2018 1016    No results found for: SPEP, UPEP  Lab Results  Component Value Date   WBC 9.2 06/12/2019   NEUTROABS 3.6 06/12/2019   HGB 11.2 (L) 06/12/2019   HCT 35.0 (L) 06/12/2019   MCV 87.5 06/12/2019   PLT 254 06/12/2019      Chemistry      Component Value Date/Time   NA 139 06/12/2019 1019   NA 140 10/09/2016 1106   K 4.2 06/12/2019 1019   K 4.7 10/09/2016 1106   CL 108 06/12/2019 1019   CL 104 03/28/2012 1426   CO2 22 06/12/2019 1019   CO2 28 10/09/2016 1106   BUN 27 (H) 06/12/2019 1019   BUN 20.8 10/09/2016 1106   CREATININE 1.22 06/12/2019 1019   CREATININE 1.15 09/06/2018 1016   CREATININE 1.1 10/09/2016 1106      Component Value Date/Time   CALCIUM 8.5 (L) 06/12/2019 1019   CALCIUM 9.1 10/09/2016 1106   ALKPHOS 55 06/12/2019 1019   ALKPHOS 46 10/09/2016 1106   AST 18 06/12/2019 1019   AST 18 09/06/2018 1016   AST 17 10/09/2016 1106   ALT 12 06/12/2019 1019   ALT 12 09/06/2018 1016   ALT 8 10/09/2016 1106   BILITOT 0.4  06/12/2019 1019   BILITOT 0.3 09/06/2018 1016   BILITOT 0.23 10/09/2016 1106

## 2019-06-12 NOTE — Telephone Encounter (Signed)
Scheduled per 5/17 sch message. Left voicemail- appts on 8/17.

## 2019-06-12 NOTE — Assessment & Plan Note (Signed)
He has chronic bilateral lower extremity edema He told me he is on a diuretic therapy

## 2019-06-12 NOTE — Assessment & Plan Note (Signed)
Anemia is stable/improving It is likely related to chronic kidney disease and his underlying bone marrow disorder He is not symptomatic Observe only

## 2019-06-14 DIAGNOSIS — M25511 Pain in right shoulder: Secondary | ICD-10-CM | POA: Diagnosis not present

## 2019-06-14 DIAGNOSIS — M25512 Pain in left shoulder: Secondary | ICD-10-CM | POA: Diagnosis not present

## 2019-06-28 DIAGNOSIS — M25512 Pain in left shoulder: Secondary | ICD-10-CM | POA: Diagnosis not present

## 2019-06-28 DIAGNOSIS — M25511 Pain in right shoulder: Secondary | ICD-10-CM | POA: Diagnosis not present

## 2019-07-06 ENCOUNTER — Other Ambulatory Visit: Payer: Self-pay | Admitting: Hematology and Oncology

## 2019-07-12 DIAGNOSIS — M25512 Pain in left shoulder: Secondary | ICD-10-CM | POA: Diagnosis not present

## 2019-07-12 DIAGNOSIS — M25511 Pain in right shoulder: Secondary | ICD-10-CM | POA: Diagnosis not present

## 2019-07-26 DIAGNOSIS — M25511 Pain in right shoulder: Secondary | ICD-10-CM | POA: Diagnosis not present

## 2019-07-26 DIAGNOSIS — M25512 Pain in left shoulder: Secondary | ICD-10-CM | POA: Diagnosis not present

## 2019-08-02 DIAGNOSIS — R69 Illness, unspecified: Secondary | ICD-10-CM | POA: Diagnosis not present

## 2019-08-02 MED FILL — IMBRUVICA 280 MG TAB: 280 | 28 days supply | Qty: 28 | Fill #1

## 2019-08-07 DIAGNOSIS — M545 Low back pain: Secondary | ICD-10-CM | POA: Diagnosis not present

## 2019-08-07 DIAGNOSIS — G894 Chronic pain syndrome: Secondary | ICD-10-CM | POA: Diagnosis not present

## 2019-08-07 DIAGNOSIS — Z5181 Encounter for therapeutic drug level monitoring: Secondary | ICD-10-CM | POA: Diagnosis not present

## 2019-08-07 DIAGNOSIS — Z79899 Other long term (current) drug therapy: Secondary | ICD-10-CM | POA: Diagnosis not present

## 2019-08-07 DIAGNOSIS — Z79891 Long term (current) use of opiate analgesic: Secondary | ICD-10-CM | POA: Diagnosis not present

## 2019-08-07 DIAGNOSIS — M5416 Radiculopathy, lumbar region: Secondary | ICD-10-CM | POA: Diagnosis not present

## 2019-08-08 DIAGNOSIS — M25512 Pain in left shoulder: Secondary | ICD-10-CM | POA: Diagnosis not present

## 2019-08-08 DIAGNOSIS — M25511 Pain in right shoulder: Secondary | ICD-10-CM | POA: Diagnosis not present

## 2019-08-23 DIAGNOSIS — M25512 Pain in left shoulder: Secondary | ICD-10-CM | POA: Diagnosis not present

## 2019-08-23 DIAGNOSIS — M25511 Pain in right shoulder: Secondary | ICD-10-CM | POA: Diagnosis not present

## 2019-08-28 DIAGNOSIS — E119 Type 2 diabetes mellitus without complications: Secondary | ICD-10-CM | POA: Diagnosis not present

## 2019-08-29 DIAGNOSIS — R739 Hyperglycemia, unspecified: Secondary | ICD-10-CM | POA: Diagnosis not present

## 2019-08-29 DIAGNOSIS — I10 Essential (primary) hypertension: Secondary | ICD-10-CM | POA: Diagnosis not present

## 2019-08-29 DIAGNOSIS — Z79899 Other long term (current) drug therapy: Secondary | ICD-10-CM | POA: Diagnosis not present

## 2019-08-29 DIAGNOSIS — E7800 Pure hypercholesterolemia, unspecified: Secondary | ICD-10-CM | POA: Diagnosis not present

## 2019-08-29 DIAGNOSIS — Z0000 Encounter for general adult medical examination without abnormal findings: Secondary | ICD-10-CM | POA: Diagnosis not present

## 2019-08-31 MED FILL — IMBRUVICA 280 MG TAB: 280 | 28 days supply | Qty: 28 | Fill #2

## 2019-09-05 DIAGNOSIS — R351 Nocturia: Secondary | ICD-10-CM | POA: Diagnosis not present

## 2019-09-05 DIAGNOSIS — R609 Edema, unspecified: Secondary | ICD-10-CM | POA: Diagnosis not present

## 2019-09-05 DIAGNOSIS — K5710 Diverticulosis of small intestine without perforation or abscess without bleeding: Secondary | ICD-10-CM | POA: Diagnosis not present

## 2019-09-05 DIAGNOSIS — C9190 Lymphoid leukemia, unspecified not having achieved remission: Secondary | ICD-10-CM | POA: Diagnosis not present

## 2019-09-05 DIAGNOSIS — R161 Splenomegaly, not elsewhere classified: Secondary | ICD-10-CM | POA: Diagnosis not present

## 2019-09-05 DIAGNOSIS — E46 Unspecified protein-calorie malnutrition: Secondary | ICD-10-CM | POA: Diagnosis not present

## 2019-09-05 DIAGNOSIS — E118 Type 2 diabetes mellitus with unspecified complications: Secondary | ICD-10-CM | POA: Diagnosis not present

## 2019-09-05 DIAGNOSIS — N401 Enlarged prostate with lower urinary tract symptoms: Secondary | ICD-10-CM | POA: Diagnosis not present

## 2019-09-05 DIAGNOSIS — K219 Gastro-esophageal reflux disease without esophagitis: Secondary | ICD-10-CM | POA: Diagnosis not present

## 2019-09-05 DIAGNOSIS — R630 Anorexia: Secondary | ICD-10-CM | POA: Diagnosis not present

## 2019-09-06 DIAGNOSIS — M25512 Pain in left shoulder: Secondary | ICD-10-CM | POA: Diagnosis not present

## 2019-09-06 DIAGNOSIS — M25511 Pain in right shoulder: Secondary | ICD-10-CM | POA: Diagnosis not present

## 2019-09-12 ENCOUNTER — Inpatient Hospital Stay: Payer: Medicare HMO | Attending: Hematology and Oncology | Admitting: Hematology and Oncology

## 2019-09-12 ENCOUNTER — Telehealth: Payer: Self-pay | Admitting: Hematology and Oncology

## 2019-09-12 ENCOUNTER — Inpatient Hospital Stay: Payer: Medicare HMO

## 2019-09-12 ENCOUNTER — Other Ambulatory Visit: Payer: Self-pay

## 2019-09-12 ENCOUNTER — Encounter: Payer: Self-pay | Admitting: Hematology and Oncology

## 2019-09-12 DIAGNOSIS — E119 Type 2 diabetes mellitus without complications: Secondary | ICD-10-CM | POA: Diagnosis not present

## 2019-09-12 DIAGNOSIS — D649 Anemia, unspecified: Secondary | ICD-10-CM | POA: Diagnosis not present

## 2019-09-12 DIAGNOSIS — D630 Anemia in neoplastic disease: Secondary | ICD-10-CM | POA: Diagnosis not present

## 2019-09-12 DIAGNOSIS — N183 Chronic kidney disease, stage 3 unspecified: Secondary | ICD-10-CM | POA: Insufficient documentation

## 2019-09-12 DIAGNOSIS — Z79899 Other long term (current) drug therapy: Secondary | ICD-10-CM | POA: Insufficient documentation

## 2019-09-12 DIAGNOSIS — C9110 Chronic lymphocytic leukemia of B-cell type not having achieved remission: Secondary | ICD-10-CM | POA: Diagnosis not present

## 2019-09-12 DIAGNOSIS — E1122 Type 2 diabetes mellitus with diabetic chronic kidney disease: Secondary | ICD-10-CM | POA: Insufficient documentation

## 2019-09-12 LAB — CBC WITH DIFFERENTIAL/PLATELET
Abs Immature Granulocytes: 0.04 10*3/uL (ref 0.00–0.07)
Basophils Absolute: 0 10*3/uL (ref 0.0–0.1)
Basophils Relative: 0 %
Eosinophils Absolute: 0.1 10*3/uL (ref 0.0–0.5)
Eosinophils Relative: 1 %
HCT: 35.2 % — ABNORMAL LOW (ref 39.0–52.0)
Hemoglobin: 11.1 g/dL — ABNORMAL LOW (ref 13.0–17.0)
Immature Granulocytes: 0 %
Lymphocytes Relative: 55 %
Lymphs Abs: 4.9 10*3/uL — ABNORMAL HIGH (ref 0.7–4.0)
MCH: 27.8 pg (ref 26.0–34.0)
MCHC: 31.5 g/dL (ref 30.0–36.0)
MCV: 88 fL (ref 80.0–100.0)
Monocytes Absolute: 0.4 10*3/uL (ref 0.1–1.0)
Monocytes Relative: 4 %
Neutro Abs: 3.7 10*3/uL (ref 1.7–7.7)
Neutrophils Relative %: 40 %
Platelets: 258 10*3/uL (ref 150–400)
RBC: 4 MIL/uL — ABNORMAL LOW (ref 4.22–5.81)
RDW: 15 % (ref 11.5–15.5)
WBC: 9.1 10*3/uL (ref 4.0–10.5)
nRBC: 0 % (ref 0.0–0.2)

## 2019-09-12 LAB — COMPREHENSIVE METABOLIC PANEL
ALT: 9 U/L (ref 0–44)
AST: 15 U/L (ref 15–41)
Albumin: 3.4 g/dL — ABNORMAL LOW (ref 3.5–5.0)
Alkaline Phosphatase: 51 U/L (ref 38–126)
Anion gap: 8 (ref 5–15)
BUN: 20 mg/dL (ref 8–23)
CO2: 24 mmol/L (ref 22–32)
Calcium: 9.1 mg/dL (ref 8.9–10.3)
Chloride: 107 mmol/L (ref 98–111)
Creatinine, Ser: 1.26 mg/dL — ABNORMAL HIGH (ref 0.61–1.24)
GFR calc Af Amer: 59 mL/min — ABNORMAL LOW (ref >60)
GFR calc non Af Amer: 51 mL/min — ABNORMAL LOW (ref >60)
Glucose, Bld: 180 mg/dL — ABNORMAL HIGH (ref 70–99)
Potassium: 4.7 mmol/L (ref 3.5–5.1)
Sodium: 139 mmol/L (ref 135–145)
Total Bilirubin: 0.3 mg/dL (ref 0.3–1.2)
Total Protein: 6.4 g/dL — ABNORMAL LOW (ref 6.5–8.1)

## 2019-09-12 NOTE — Assessment & Plan Note (Signed)
His renal function is stable We discussed the importance of risk factor modification

## 2019-09-12 NOTE — Assessment & Plan Note (Signed)
Anemia is stable It is likely related to chronic kidney disease and his underlying bone marrow disorder He is not symptomatic Observe only 

## 2019-09-12 NOTE — Telephone Encounter (Signed)
Scheduled appt per 8/17 sch msg. Gave pt a print out of AVS.

## 2019-09-12 NOTE — Progress Notes (Signed)
[facility] OFFICE PROGRESS NOTE  Patient Care Team: Jani Gravel, MD as PCP - General (Internal Medicine) Heath Lark, MD as Consulting Physician (Hematology and Oncology)  ASSESSMENT & PLAN:  CLL (chronic lymphocytic leukemia) ([place]) He tolerated treatment very well Lymphocytosis is stable His hemoglobin is stable We will continue treatment without dose adjustment I will see him back 3 months for follow-up I do not plan surveillance imaging studies unless he have signs or symptoms to suggest cancer progression I recommend the patient to proceed with Covid vaccination and influenza vaccination but he declined  Anemia in neoplastic disease Anemia is stable It is likely related to chronic kidney disease and his underlying bone marrow disorder He is not symptomatic Observe only  Chronic kidney disease, stage III (moderate) His renal function is stable We discussed the importance of risk factor modification  Type II diabetes mellitus ([place]) According to the patient, his blood sugar is stable His blood sugar is mildly elevated and his blood work today I will defer to his primary care doctor for further management   No orders of the defined types were placed in this encounter.   All questions were answered. The patient knows to call the clinic with any problems, questions or concerns. The total time spent in the appointment was 20 minutes encounter with patients including review of chart and various tests results, discussions about plan of care and coordination of care plan   Heath Lark, MD 09/12/2019 1:10 PM  INTERVAL HISTORY: Please see below for problem oriented charting. He returns for further follow-up He is compliant taking his medications as directed He feels fine No recent infection, fever or chills The patient declined Covid vaccination He denies worsening leg swelling His appetite is fair SUMMARY OF ONCOLOGIC HISTORY: Oncology History  CLL (chronic  lymphocytic leukemia) ([place])  04/03/2008 Pathology Results   Case #: [id number] FLow cytomtry confirmed CLL   10/11/2014 Tumor Marker   FISH analysis showed trisomy 12 abnormality   04/29/2015 -  Chemotherapy   He started taking Ibrutinib   04/29/2015 Imaging   Ct scan showed mild lymphadenopathy and splenomegaly   05/13/2015 Adverse Reaction   Due to neutropenia, dose of Ibrutinib is reduced to 280 mg daily   01/08/2016 Imaging   Response to therapy. Resolution of thoracic and abdominal adenopathy. Resolution of splenomegaly. 2. No new or progressive disease. 3. Small bowel containing left inguinal hernia and right-sided hydrocele, as before. 4. Esophageal air fluid level suggests dysmotility or gastroesophageal reflux. 5. Possible constipation. 6. Suspect hepatic hemangiomas, similar.   10/09/2016 Imaging   No findings suspicious for active lymphomatous involvement.  No abdominopelvic lymphadenopathy. Spleen is normal in size.  Moderate to large left inguinal/scrotal hernia containing small bowel and fat. No evidence of bowel obstruction.  Additional stable ancillary findings as above.   04/26/2017 Imaging   1. No lymphadenopathy in the chest, abdomen, or pelvis. 2. Mild circumferential bladder wall thickening. Incomplete distention may contribute to this appearance, but bladder infection is also consideration. 3. Stable appearance small hepatic lesions. Imaging features most suggestive of benign cavernous hemangiomas. 4. Stable tiny low-density renal lesions compatible with cysts. 5. Large left groin hernia contains numerous small bowel loops without complicating features. 6. Right-sided hydrocele.   11/15/2017 Imaging   Large recurrent left inguinal hernia with extension of multiple small bowel loops into the inguinal canal and scrotum on the left. Proximal small bowel dilatation is noted consistent with a degree of incarceration.  Bilateral small effusions  as well as patchy bibasilar  infiltrates.    12/14/2017 Adverse Reaction   Ibrutinib placed on hold     REVIEW OF SYSTEMS:   Constitutional: Denies fevers, chills or abnormal weight loss Eyes: Denies blurriness of vision Ears, nose, mouth, throat, and face: Denies mucositis or sore throat Respiratory: Denies cough, dyspnea or wheezes Cardiovascular: Denies palpitation, chest discomfort or lower extremity swelling Gastrointestinal:  Denies nausea, heartburn or change in bowel habits Skin: Denies abnormal skin rashes Lymphatics: Denies new lymphadenopathy or easy bruising Neurological:Denies numbness, tingling or new weaknesses Behavioral/Psych: Mood is stable, no new changes  All other systems were reviewed with the patient and are negative.  I have reviewed the past medical history, past surgical history, social history and family history with the patient and they are unchanged from previous note.  ALLERGIES:  has No Known Allergies.  MEDICATIONS:  Current Outpatient Medications  Medication Sig Dispense Refill  . acetaminophen (TYLENOL) 650 MG CR tablet Take 1 tablet (650 mg total) by mouth 2 (two) times daily as needed for pain. 30 tablet 0  . aspirin EC 81 MG tablet Take 81 mg by mouth daily.    . Capsaicin (ASPERCREME PAIN RELIEF PATCH EX) Apply 1 patch topically daily. Apply 1 patch to left shoulder 12 hrs/day. On at 8 am, off at 8 pm    . Cholecalciferol (VITAMIN D3) 1000 units CAPS Take 1,000 Units by mouth 2 (two) times daily.     . collagenase (SANTYL) ointment Apply 1 application topically daily.    . Cyanocobalamin (VITAMIN B 12 PO) Take 1,000 mcg by mouth daily.    Marland Kitchen gabapentin (NEURONTIN) 100 MG capsule Take 100 mg by mouth 3 (three) times daily.    . IMBRUVICA 280 MG TABS TAKE 1 TABLET (280MG ) BY MOUTH DAILY. 28 tablet 11  . insulin NPH-regular Human (70-30) 100 UNIT/ML injection Inject 15 Units into the skin 2 (two) times daily with a meal.    . mirtazapine (REMERON) 15 MG tablet Take 1 tablet  (15 mg total) by mouth at bedtime.    . Multiple Vitamin (MULTIVITAMIN WITH MINERALS) TABS tablet Take 1 tablet by mouth daily.    . Nutritional Supplements (GLUCERNA 1.0 CAL) LIQD Take 30 Cans by mouth 2 (two) times daily.    Marland Kitchen oxyCODONE (OXY IR/ROXICODONE) 5 MG immediate release tablet Take 1 tablet (5 mg total) by mouth every 6 (six) hours as needed for moderate pain, severe pain or breakthrough pain. 5 tablet 0  . Polyethyl Glycol-Propyl Glycol (SYSTANE OP) Place 1 drop into both eyes 2 (two) times daily.    . polyethylene glycol (MIRALAX / GLYCOLAX) packet Take 17 g by mouth daily. 14 each 0  . tamsulosin (FLOMAX) 0.4 MG CAPS capsule Take 0.4 mg by mouth daily.     No current facility-administered medications for this visit.    PHYSICAL EXAMINATION: ECOG PERFORMANCE STATUS: 1 - Symptomatic but completely ambulatory  Vitals:   09/12/19 1052  BP: (!) 136/45  Pulse: (!) 58  Resp: 18  Temp: 97.7 F (36.5 C)  SpO2: 99%   Filed Weights   09/12/19 1052  Weight: 216 lb 12.8 oz (98.3 kg)    GENERAL:alert, no distress and comfortable SKIN: skin color, texture, turgor are normal, no rashes or significant lesions EYES: normal, Conjunctiva are pink and non-injected, sclera clear OROPHARYNX:no exudate, no erythema and lips, buccal mucosa, and tongue normal  NECK: supple, thyroid normal size, non-tender, without nodularity LYMPH:  no palpable lymphadenopathy in the  cervical, axillary or inguinal LUNGS: clear to auscultation and percussion with normal breathing effort HEART: regular rate & rhythm and no murmurs and no lower extremity edema ABDOMEN:abdomen soft, non-tender and normal bowel sounds Musculoskeletal:no cyanosis of digits and no clubbing  NEURO: alert & oriented x 3 with fluent speech, no focal motor/sensory deficits  LABORATORY DATA:  I have reviewed the data as listed    Component Value Date/Time   NA 139 09/12/2019 1034   NA 140 10/09/2016 1106   K 4.7 09/12/2019 1034    K 4.7 10/09/2016 1106   CL 107 09/12/2019 1034   CL 104 03/28/2012 1426   CO2 24 09/12/2019 1034   CO2 28 10/09/2016 1106   GLUCOSE 180 (H) 09/12/2019 1034   GLUCOSE 127 10/09/2016 1106   GLUCOSE 171 (H) 03/28/2012 1426   BUN 20 09/12/2019 1034   BUN 20.8 10/09/2016 1106   CREATININE 1.26 (H) 09/12/2019 1034   CREATININE 1.15 09/06/2018 1016   CREATININE 1.1 10/09/2016 1106   CALCIUM 9.1 09/12/2019 1034   CALCIUM 9.1 10/09/2016 1106   PROT 6.4 (L) 09/12/2019 1034   PROT 5.8 (L) 10/09/2016 1106   ALBUMIN 3.4 (L) 09/12/2019 1034   ALBUMIN 3.1 (L) 10/09/2016 1106   AST 15 09/12/2019 1034   AST 18 09/06/2018 1016   AST 17 10/09/2016 1106   ALT 9 09/12/2019 1034   ALT 12 09/06/2018 1016   ALT 8 10/09/2016 1106   ALKPHOS 51 09/12/2019 1034   ALKPHOS 46 10/09/2016 1106   BILITOT 0.3 09/12/2019 1034   BILITOT 0.3 09/06/2018 1016   BILITOT 0.23 10/09/2016 1106   GFRNONAA 51 (L) 09/12/2019 1034   GFRNONAA 57 (L) 09/06/2018 1016   GFRAA 59 (L) 09/12/2019 1034   GFRAA >60 09/06/2018 1016    No results found for: SPEP, UPEP  Lab Results  Component Value Date   WBC 9.1 09/12/2019   NEUTROABS 3.7 09/12/2019   HGB 11.1 (L) 09/12/2019   HCT 35.2 (L) 09/12/2019   MCV 88.0 09/12/2019   PLT 258 09/12/2019      Chemistry      Component Value Date/Time   NA 139 09/12/2019 1034   NA 140 10/09/2016 1106   K 4.7 09/12/2019 1034   K 4.7 10/09/2016 1106   CL 107 09/12/2019 1034   CL 104 03/28/2012 1426   CO2 24 09/12/2019 1034   CO2 28 10/09/2016 1106   BUN 20 09/12/2019 1034   BUN 20.8 10/09/2016 1106   CREATININE 1.26 (H) 09/12/2019 1034   CREATININE 1.15 09/06/2018 1016   CREATININE 1.1 10/09/2016 1106      Component Value Date/Time   CALCIUM 9.1 09/12/2019 1034   CALCIUM 9.1 10/09/2016 1106   ALKPHOS 51 09/12/2019 1034   ALKPHOS 46 10/09/2016 1106   AST 15 09/12/2019 1034   AST 18 09/06/2018 1016   AST 17 10/09/2016 1106   ALT 9 09/12/2019 1034   ALT 12  09/06/2018 1016   ALT 8 10/09/2016 1106   BILITOT 0.3 09/12/2019 1034   BILITOT 0.3 09/06/2018 1016   BILITOT 0.23 10/09/2016 1106

## 2019-09-12 NOTE — Assessment & Plan Note (Signed)
According to the patient, his blood sugar is stable His blood sugar is mildly elevated and his blood work today I will defer to his primary care doctor for further management

## 2019-09-12 NOTE — Assessment & Plan Note (Signed)
He tolerated treatment very well Lymphocytosis is stable His hemoglobin is stable We will continue treatment without dose adjustment I will see him back 3 months for follow-up I do not plan surveillance imaging studies unless he have signs or symptoms to suggest cancer progression I recommend the patient to proceed with Covid vaccination and influenza vaccination but he declined

## 2019-09-20 DIAGNOSIS — M25512 Pain in left shoulder: Secondary | ICD-10-CM | POA: Diagnosis not present

## 2019-09-20 DIAGNOSIS — M25511 Pain in right shoulder: Secondary | ICD-10-CM | POA: Diagnosis not present

## 2019-09-28 MED FILL — IMBRUVICA 280 MG TAB: 280 | 28 days supply | Qty: 28 | Fill #3

## 2019-10-03 DIAGNOSIS — H52223 Regular astigmatism, bilateral: Secondary | ICD-10-CM | POA: Diagnosis not present

## 2019-10-03 DIAGNOSIS — H5202 Hypermetropia, left eye: Secondary | ICD-10-CM | POA: Diagnosis not present

## 2019-10-03 DIAGNOSIS — H524 Presbyopia: Secondary | ICD-10-CM | POA: Diagnosis not present

## 2019-10-04 DIAGNOSIS — M25511 Pain in right shoulder: Secondary | ICD-10-CM | POA: Diagnosis not present

## 2019-10-04 DIAGNOSIS — M25512 Pain in left shoulder: Secondary | ICD-10-CM | POA: Diagnosis not present

## 2019-10-18 DIAGNOSIS — M25511 Pain in right shoulder: Secondary | ICD-10-CM | POA: Diagnosis not present

## 2019-10-18 DIAGNOSIS — M25512 Pain in left shoulder: Secondary | ICD-10-CM | POA: Diagnosis not present

## 2019-10-18 DIAGNOSIS — R69 Illness, unspecified: Secondary | ICD-10-CM | POA: Diagnosis not present

## 2019-10-23 ENCOUNTER — Ambulatory Visit (INDEPENDENT_AMBULATORY_CARE_PROVIDER_SITE_OTHER): Payer: Medicare HMO | Admitting: Ophthalmology

## 2019-10-23 ENCOUNTER — Other Ambulatory Visit: Payer: Self-pay

## 2019-10-23 ENCOUNTER — Encounter (INDEPENDENT_AMBULATORY_CARE_PROVIDER_SITE_OTHER): Payer: Self-pay | Admitting: Ophthalmology

## 2019-10-23 DIAGNOSIS — E113592 Type 2 diabetes mellitus with proliferative diabetic retinopathy without macular edema, left eye: Secondary | ICD-10-CM

## 2019-10-23 DIAGNOSIS — E113551 Type 2 diabetes mellitus with stable proliferative diabetic retinopathy, right eye: Secondary | ICD-10-CM | POA: Insufficient documentation

## 2019-10-23 NOTE — Assessment & Plan Note (Signed)

## 2019-10-23 NOTE — Assessment & Plan Note (Signed)
Plan PRP OS in 6 months

## 2019-10-23 NOTE — Patient Instructions (Signed)
The nature of proliferative diabetic retinopathy was discussed with the patient as well as the potential for a vitreous hemorrhage and traction on the retina. Tight control of glucose, blood pressure, and serum lipids was recommended in order to slow further progression of disease. Cessation of smoking was recommended. Treatment options including ocular injectable medications, panretinal photocoagulation and/or vitrectomy surgery for advanced disease were discussed. Local medical therapy may slow or arrest progression of disease. 

## 2019-10-23 NOTE — Progress Notes (Signed)
10/23/2019     CHIEF COMPLAINT Patient presents for Retina Evaluation   HISTORY OF PRESENT ILLNESS: Michael [name] is a 86 y.o. male who presents to the clinic today for:   HPI    Retina Evaluation    In left eye.  This started 1 month ago.  Duration of 1 month.  Associated Symptoms Floaters.  Context:  distance vision, mid-range vision and near vision.          Comments    NP Diabetic Eval OU  Pt c/o "trash" in eye OS. Pt denies blur OU. Pt denies ocular pain OU.       Last edited by Rockie Neighbours, [place] on 10/23/2019  9:13 AM. (History)      Referring physician: Jani Gravel, MD [address] [place],  [place] [address]  HISTORICAL INFORMATION:   Selected notes from the MEDICAL RECORD NUMBER    Lab Results  Component Value Date   HGBA1C 7.0 (H) 11/04/2017     CURRENT MEDICATIONS: Current Outpatient Medications (Ophthalmic Drugs)  Medication Sig  . Polyethyl Glycol-Propyl Glycol (SYSTANE OP) Place 1 drop into both eyes 2 (two) times daily.   No current facility-administered medications for this visit. (Ophthalmic Drugs)   Current Outpatient Medications (Other)  Medication Sig  . acetaminophen (TYLENOL) 650 MG CR tablet Take 1 tablet (650 mg total) by mouth 2 (two) times daily as needed for pain.  Marland Kitchen aspirin EC 81 MG tablet Take 81 mg by mouth daily.  . Capsaicin (ASPERCREME PAIN RELIEF PATCH EX) Apply 1 patch topically daily. Apply 1 patch to left shoulder 12 hrs/day. On at 8 am, off at 8 pm  . Cholecalciferol (VITAMIN D3) 1000 units CAPS Take 1,000 Units by mouth 2 (two) times daily.   . collagenase (SANTYL) ointment Apply 1 application topically daily.  . Cyanocobalamin (VITAMIN B 12 PO) Take 1,000 mcg by mouth daily.  Marland Kitchen gabapentin (NEURONTIN) 100 MG capsule Take 100 mg by mouth 3 (three) times daily.  . IMBRUVICA 280 MG TABS TAKE 1 TABLET (280MG ) BY MOUTH DAILY.  Marland Kitchen insulin NPH-regular Human (70-30) 100 UNIT/ML injection Inject 15 Units into the  skin 2 (two) times daily with a meal.  . mirtazapine (REMERON) 15 MG tablet Take 1 tablet (15 mg total) by mouth at bedtime.  . Multiple Vitamin (MULTIVITAMIN WITH MINERALS) TABS tablet Take 1 tablet by mouth daily.  . Nutritional Supplements (GLUCERNA 1.0 CAL) LIQD Take 30 Cans by mouth 2 (two) times daily.  Marland Kitchen oxyCODONE (OXY IR/ROXICODONE) 5 MG immediate release tablet Take 1 tablet (5 mg total) by mouth every 6 (six) hours as needed for moderate pain, severe pain or breakthrough pain.  . polyethylene glycol (MIRALAX / GLYCOLAX) packet Take 17 g by mouth daily.  . tamsulosin (FLOMAX) 0.4 MG CAPS capsule Take 0.4 mg by mouth daily.   No current facility-administered medications for this visit. (Other)      REVIEW OF SYSTEMS:    ALLERGIES No Known Allergies  PAST MEDICAL HISTORY Past Medical History:  Diagnosis Date  . Anemia   . Anginal pain (HCC)    upon exertion  . Arthritis    "left shoulder, lower back" (10/20/2016)  . CLL (chronic lymphoblastic leukemia) dx'd 2000  . Edema leg   . Family history of adverse reaction to anesthesia    "daughter's BP drops"   . GERD (gastroesophageal reflux disease)   . History of hiatal hernia   . 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  Lymphocytosis   . Type II diabetes mellitus ([place])    Past Surgical History:  Procedure Laterality Date  . BOWEL RESECTION N/A 11/15/2017   Procedure: SMALL BOWEL RESECTION;  Surgeon: Coralie Keens, MD;  Location: [facility];  Service: General;  Laterality: N/A;  . CATARACT EXTRACTION W/ INTRAOCULAR LENS  IMPLANT, BILATERAL Bilateral   . EYE SURGERY     Cataracts (bilateral)  . GROIN DISSECTION Left 11/15/2017   Procedure: GROIN EXPLORATION WITH REMOVAL OF INGUINAL HERNIA MESH;  Surgeon: Coralie Keens, MD;  Location: [facility];  Service: General;  Laterality: Left;  . INGUINAL HERNIA REPAIR Left 11/10/2017   Procedure: LEFT INGUINAL HERNIA REPAIR ERAS PATHWAY;  Surgeon: Coralie Keens, MD;   Location: [facility];  Service: General;  Laterality: Left;  . INGUINAL HERNIA REPAIR Left 11/15/2017   Procedure: LEFT INGUINAL HERNIA REPAIR WITH MESH;  Surgeon: Coralie Keens, MD;  Location: [facility];  Service: General;  Laterality: Left;  . INSERTION OF MESH Left 11/10/2017   Procedure: INSERTION OF MESH;  Surgeon: Coralie Keens, MD;  Location: [facility];  Service: General;  Laterality: Left;    FAMILY HISTORY Family History  Problem Relation Age of Onset  . Cancer Mother   . Hypertension Father   . Diabetes Father     SOCIAL HISTORY Social History   Tobacco Use  . Smoking status: Never Smoker  . Smokeless tobacco: Former Systems developer    Types: Snuff  Vaping Use  . Vaping Use: Never used  Substance Use Topics  . Alcohol use: Not Currently  . Drug use: No         OPHTHALMIC EXAM: Base Eye Exam    Visual Acuity (ETDRS)      Right Left   Dist cc 20/30 -2 20/400   Dist ph cc 20/20 20/100   Correction: Glasses       Tonometry (Tonopen, 9:14 AM)      Right Left   Pressure 21 19       Pupils      Pupils Dark Light Shape React APD   Right PERRL 4 3 Round Brisk None   Left PERRL 4 3 Round Brisk None       Visual Fields (Counting fingers)      Left Right    Full Full       Extraocular Movement      Right Left    Full Full       Neuro/Psych    Oriented x3: Yes   Mood/Affect: Normal       Dilation    Both eyes: 1.0% Mydriacyl, 2.5% Phenylephrine @ 9:17 AM          IMAGING AND PROCEDURES  Imaging and Procedures for 10/23/19  OCT, Retina - OU - Both Eyes       Right Eye Quality was good. Scan locations included subfoveal. Central Foveal Thickness: 333.   Left Eye Quality was good. Scan locations included subfoveal. Central Foveal Thickness: 342.   Notes No active maculopathy OU, will observe       Color Fundus Photography Optos - OU - Both Eyes       Right Eye Progression has been stable. Disc findings include normal observations. Macula :  microaneurysms.   Left Eye Progression has been stable. Disc findings include normal observations. Macula : microaneurysms.   Notes Good PRP nasal to 70 degrees.  Open temporal window for more PRP OD in the future  OS, good PRP yet room temporally and  nasally for completion                  ASSESSMENT/PLAN:  Stable treated proliferative diabetic retinopathy of right eye determined by examination associated with type 2 diabetes mellitus ([place]) The nature of regressed proliferative diabetic retinopathy was discussed with the patient. The patient was advised to maintain good glucose, blood pressure, monitor kidney function and serum lipid control as advised by personal physician. Rare risk for reactivation of progression exist with untreated severe anemia, untreated renal failure, untreated heart failure, and smoking. Complete avoidance of smoking was recommended. The chance of recurrent proliferative diabetic retinopathy was discussed as well as the chance of vitreous hemorrhage for which further treatments may be necessary.   Explained to the patient that the quiescent  proliferative diabetic retinopathy disease is unlikely to ever worsen.  Worsening factors would include however severe anemia, hypertension out-of-control or impending renal failure.  Proliferative diabetic retinopathy of left eye (HCC) Plan PRP OS in 6 months      ICD-10-CM   1. Proliferative diabetic retinopathy of left eye without macular edema associated with type 2 diabetes mellitus (HCC)  E11.3592 OCT, Retina - OU - Both Eyes    Color Fundus Photography Optos - OU - Both Eyes  2. Stable treated proliferative diabetic retinopathy of right eye determined by examination associated with type 2 diabetes mellitus ([place])  E11.3551 OCT, Retina - OU - Both Eyes    Color Fundus Photography Optos - OU - Both Eyes    1.  Patient advised to continue with good blood sugar control  2.  We will complete PRP OS and OD in the  future so as to prevent neovascular disease during the final decades  3.  Ophthalmic Meds Ordered this visit:  No orders of the defined types were placed in this encounter.      Return in about 6 months (around 04/21/2020) for DILATE OU, COLOR FP, PRP, OS.  There are no Patient Instructions on file for this visit.   Explained the diagnoses, plan, and follow up with the patient and they expressed understanding.  Patient expressed understanding of the importance of proper follow up care.   Clent [place]ark Yeilin Zweber M.D. Diseases & Surgery of the Retina and Vitreous Retina [facility] Diabetic [place] 10/23/19     Abbreviations: M myopia (nearsighted); A astigmatism; H hyperopia (farsighted); P presbyopia; Mrx spectacle prescription;  CTL contact lenses; OD right eye; OS left eye; OU both eyes  XT exotropia; ET esotropia; PEK punctate epithelial keratitis; PEE punctate epithelial erosions; DES dry eye syndrome; MGD meibomian gland dysfunction; ATs artificial tears; PFAT's preservative free artificial tears; [place] nuclear sclerotic cataract; PSC posterior subcapsular cataract; ERM epi-retinal membrane; PVD posterior vitreous detachment; RD retinal detachment; DM diabetes mellitus; DR diabetic retinopathy; NPDR non-proliferative diabetic retinopathy; PDR proliferative diabetic retinopathy; CSME clinically significant macular edema; DME diabetic macular edema; dbh dot blot hemorrhages; CWS cotton wool spot; POAG primary open angle glaucoma; C/D cup-to-disc ratio; HVF humphrey visual field; GVF goldmann visual field; OCT optical coherence tomography; IOP intraocular pressure; BRVO Branch retinal vein occlusion; CRVO central retinal vein occlusion; CRAO central retinal artery occlusion; BRAO branch retinal artery occlusion; RT retinal tear; SB scleral buckle; PPV pars plana vitrectomy; VH Vitreous hemorrhage; PRP panretinal laser photocoagulation; IVK intravitreal kenalog; VMT vitreomacular traction; MH Macular hole;   NVD neovascularization of the disc; NVE neovascularization elsewhere; AREDS age related eye disease study; ARMD age related macular degeneration; POAG primary open angle glaucoma; EBMD epithelial/anterior basement  membrane dystrophy; ACIOL anterior chamber intraocular lens; IOL intraocular lens; PCIOL posterior chamber intraocular lens; Phaco/IOL phacoemulsification with intraocular lens placement; [place] photorefractive keratectomy; LASIK laser assisted in situ keratomileusis; HTN hypertension; DM diabetes mellitus; COPD chronic obstructive pulmonary disease

## 2019-10-26 MED FILL — IMBRUVICA 280 MG TAB: 280 | 28 days supply | Qty: 28 | Fill #4

## 2019-11-01 DIAGNOSIS — M25511 Pain in right shoulder: Secondary | ICD-10-CM | POA: Diagnosis not present

## 2019-11-01 DIAGNOSIS — M25512 Pain in left shoulder: Secondary | ICD-10-CM | POA: Diagnosis not present

## 2019-11-15 DIAGNOSIS — M25512 Pain in left shoulder: Secondary | ICD-10-CM | POA: Diagnosis not present

## 2019-11-15 DIAGNOSIS — M25511 Pain in right shoulder: Secondary | ICD-10-CM | POA: Diagnosis not present

## 2019-11-23 MED FILL — IMBRUVICA 280 MG TAB: 280 | 28 days supply | Qty: 28 | Fill #5

## 2019-11-29 DIAGNOSIS — M25511 Pain in right shoulder: Secondary | ICD-10-CM | POA: Diagnosis not present

## 2019-11-29 DIAGNOSIS — M25512 Pain in left shoulder: Secondary | ICD-10-CM | POA: Diagnosis not present

## 2019-12-08 DIAGNOSIS — M25511 Pain in right shoulder: Secondary | ICD-10-CM | POA: Diagnosis not present

## 2019-12-08 DIAGNOSIS — M25512 Pain in left shoulder: Secondary | ICD-10-CM | POA: Diagnosis not present

## 2019-12-18 ENCOUNTER — Other Ambulatory Visit: Payer: Self-pay

## 2019-12-18 ENCOUNTER — Inpatient Hospital Stay: Payer: Medicare HMO

## 2019-12-18 ENCOUNTER — Telehealth: Payer: Self-pay | Admitting: Hematology and Oncology

## 2019-12-18 ENCOUNTER — Encounter: Payer: Self-pay | Admitting: Hematology and Oncology

## 2019-12-18 ENCOUNTER — Inpatient Hospital Stay: Payer: Medicare HMO | Attending: Hematology and Oncology | Admitting: Hematology and Oncology

## 2019-12-18 DIAGNOSIS — Z79899 Other long term (current) drug therapy: Secondary | ICD-10-CM | POA: Diagnosis not present

## 2019-12-18 DIAGNOSIS — N183 Chronic kidney disease, stage 3 unspecified: Secondary | ICD-10-CM

## 2019-12-18 DIAGNOSIS — D649 Anemia, unspecified: Secondary | ICD-10-CM | POA: Diagnosis not present

## 2019-12-18 DIAGNOSIS — D630 Anemia in neoplastic disease: Secondary | ICD-10-CM | POA: Diagnosis not present

## 2019-12-18 DIAGNOSIS — C9110 Chronic lymphocytic leukemia of B-cell type not having achieved remission: Secondary | ICD-10-CM | POA: Insufficient documentation

## 2019-12-18 LAB — CBC WITH DIFFERENTIAL/PLATELET
Abs Immature Granulocytes: 0.04 10*3/uL (ref 0.00–0.07)
Basophils Absolute: 0.1 10*3/uL (ref 0.0–0.1)
Basophils Relative: 1 %
Eosinophils Absolute: 0.1 10*3/uL (ref 0.0–0.5)
Eosinophils Relative: 1 %
HCT: 32.7 % — ABNORMAL LOW (ref 39.0–52.0)
Hemoglobin: 10.5 g/dL — ABNORMAL LOW (ref 13.0–17.0)
Immature Granulocytes: 1 %
Lymphocytes Relative: 44 %
Lymphs Abs: 3.8 10*3/uL (ref 0.7–4.0)
MCH: 27.9 pg (ref 26.0–34.0)
MCHC: 32.1 g/dL (ref 30.0–36.0)
MCV: 86.7 fL (ref 80.0–100.0)
Monocytes Absolute: 0.5 10*3/uL (ref 0.1–1.0)
Monocytes Relative: 6 %
Neutro Abs: 4.1 10*3/uL (ref 1.7–7.7)
Neutrophils Relative %: 47 %
Platelets: 263 10*3/uL (ref 150–400)
RBC: 3.77 MIL/uL — ABNORMAL LOW (ref 4.22–5.81)
RDW: 15.2 % (ref 11.5–15.5)
WBC: 8.6 10*3/uL (ref 4.0–10.5)
nRBC: 0 % (ref 0.0–0.2)

## 2019-12-18 LAB — COMPREHENSIVE METABOLIC PANEL
ALT: 9 U/L (ref 0–44)
AST: 18 U/L (ref 15–41)
Albumin: 3.2 g/dL — ABNORMAL LOW (ref 3.5–5.0)
Alkaline Phosphatase: 49 U/L (ref 38–126)
Anion gap: 5 (ref 5–15)
BUN: 22 mg/dL (ref 8–23)
CO2: 23 mmol/L (ref 22–32)
Calcium: 8.6 mg/dL — ABNORMAL LOW (ref 8.9–10.3)
Chloride: 108 mmol/L (ref 98–111)
Creatinine, Ser: 1.18 mg/dL (ref 0.61–1.24)
GFR, Estimated: 60 mL/min — ABNORMAL LOW (ref >60)
Glucose, Bld: 159 mg/dL — ABNORMAL HIGH (ref 70–99)
Potassium: 4.6 mmol/L (ref 3.5–5.1)
Sodium: 136 mmol/L (ref 135–145)
Total Bilirubin: 0.3 mg/dL (ref 0.3–1.2)
Total Protein: 6.1 g/dL — ABNORMAL LOW (ref 6.5–8.1)

## 2019-12-18 NOTE — Progress Notes (Signed)
[facility] OFFICE PROGRESS NOTE  Patient Care Team: Jani Gravel, MD as PCP - General (Internal Medicine) Heath Lark, MD as Consulting Physician (Hematology and Oncology)  ASSESSMENT & PLAN:  CLL (chronic lymphocytic leukemia) ([place]) He tolerated treatment very well Lymphocytosis is resolved His hemoglobin is stable We will continue treatment without dose adjustment I will see him back 3 months for follow-up I do not plan surveillance imaging studies unless he have signs or symptoms to suggest cancer progression I recommend the patient to proceed with Covid vaccination and influenza vaccination but he declined  Anemia in neoplastic disease Anemia is stable It is likely related to chronic kidney disease and his underlying bone marrow disorder He is not symptomatic Observe only  Chronic kidney disease, stage III (moderate) His renal function is stable We discussed the importance of risk factor modification   No orders of the defined types were placed in this encounter.   All questions were answered. The patient knows to call the clinic with any problems, questions or concerns. The total time spent in the appointment was 20 minutes encounter with patients including review of chart and various tests results, discussions about plan of care and coordination of care plan   Heath Lark, MD 12/18/2019 11:58 AM  INTERVAL HISTORY: Please see below for problem oriented charting. He returns for further follow-up on CLL He is doing well He is compliant taking medication as directed He denies missed doses No recent palpitation No recent infection, fever or chills He declined vaccinations today  SUMMARY OF ONCOLOGIC HISTORY: Oncology History  CLL (chronic lymphocytic leukemia) ([place])  04/03/2008 Pathology Results   Case #: [id number] FLow cytomtry confirmed CLL   10/11/2014 Tumor Marker   FISH analysis showed trisomy 12 abnormality   04/29/2015 -  Chemotherapy   He  started taking Ibrutinib   04/29/2015 Imaging   Ct scan showed mild lymphadenopathy and splenomegaly   05/13/2015 Adverse Reaction   Due to neutropenia, dose of Ibrutinib is reduced to 280 mg daily   01/08/2016 Imaging   Response to therapy. Resolution of thoracic and abdominal adenopathy. Resolution of splenomegaly. 2. No new or progressive disease. 3. Small bowel containing left inguinal hernia and right-sided hydrocele, as before. 4. Esophageal air fluid level suggests dysmotility or gastroesophageal reflux. 5. Possible constipation. 6. Suspect hepatic hemangiomas, similar.   10/09/2016 Imaging   No findings suspicious for active lymphomatous involvement.  No abdominopelvic lymphadenopathy. Spleen is normal in size.  Moderate to large left inguinal/scrotal hernia containing small bowel and fat. No evidence of bowel obstruction.  Additional stable ancillary findings as above.   04/26/2017 Imaging   1. No lymphadenopathy in the chest, abdomen, or pelvis. 2. Mild circumferential bladder wall thickening. Incomplete distention may contribute to this appearance, but bladder infection is also consideration. 3. Stable appearance small hepatic lesions. Imaging features most suggestive of benign cavernous hemangiomas. 4. Stable tiny low-density renal lesions compatible with cysts. 5. Large left groin hernia contains numerous small bowel loops without complicating features. 6. Right-sided hydrocele.   11/15/2017 Imaging   Large recurrent left inguinal hernia with extension of multiple small bowel loops into the inguinal canal and scrotum on the left. Proximal small bowel dilatation is noted consistent with a degree of incarceration.  Bilateral small effusions as well as patchy bibasilar infiltrates.    12/14/2017 Adverse Reaction   Ibrutinib placed on hold     REVIEW OF SYSTEMS:   Constitutional: Denies fevers, chills or abnormal weight loss Eyes: Denies  blurriness of vision Ears,  nose, mouth, throat, and face: Denies mucositis or sore throat Respiratory: Denies cough, dyspnea or wheezes Cardiovascular: Denies palpitation, chest discomfort or lower extremity swelling Gastrointestinal:  Denies nausea, heartburn or change in bowel habits Skin: Denies abnormal skin rashes Lymphatics: Denies new lymphadenopathy or easy bruising Neurological:Denies numbness, tingling or new weaknesses Behavioral/Psych: Mood is stable, no new changes  All other systems were reviewed with the patient and are negative.  I have reviewed the past medical history, past surgical history, social history and family history with the patient and they are unchanged from previous note.  ALLERGIES:  has No Known Allergies.  MEDICATIONS:  Current Outpatient Medications  Medication Sig Dispense Refill  . pantoprazole (PROTONIX) 40 MG tablet Take 40 mg by mouth daily.    . ramipril (ALTACE) 5 MG capsule Take 5 mg by mouth daily.    Marland Kitchen torsemide (DEMADEX) 20 MG tablet Take 20 mg by mouth daily.    Marland Kitchen acetaminophen (TYLENOL) 650 MG CR tablet Take 1 tablet (650 mg total) by mouth 2 (two) times daily as needed for pain. 30 tablet 0  . aspirin EC 81 MG tablet Take 81 mg by mouth daily.    . Capsaicin (ASPERCREME PAIN RELIEF PATCH EX) Apply 1 patch topically daily. Apply 1 patch to left shoulder 12 hrs/day. On at 8 am, off at 8 pm    . Cholecalciferol (VITAMIN D3) 1000 units CAPS Take 1,000 Units by mouth 2 (two) times daily.     . collagenase (SANTYL) ointment Apply 1 application topically daily.    . Cyanocobalamin (VITAMIN B 12 PO) Take 1,000 mcg by mouth daily.    Marland Kitchen gabapentin (NEURONTIN) 100 MG capsule Take 100 mg by mouth 3 (three) times daily.    . IMBRUVICA 280 MG TABS TAKE 1 TABLET (280MG ) BY MOUTH DAILY. 28 tablet 11  . insulin NPH-regular Human (70-30) 100 UNIT/ML injection Inject 15 Units into the skin 2 (two) times daily with a meal.    . mirtazapine (REMERON) 15 MG tablet Take 1 tablet (15 mg  total) by mouth at bedtime.    . Multiple Vitamin (MULTIVITAMIN WITH MINERALS) TABS tablet Take 1 tablet by mouth daily.    . Nutritional Supplements (GLUCERNA 1.0 CAL) LIQD Take 30 Cans by mouth 2 (two) times daily.    Vladimir Faster Glycol-Propyl Glycol (SYSTANE OP) Place 1 drop into both eyes 2 (two) times daily.    . polyethylene glycol (MIRALAX / GLYCOLAX) packet Take 17 g by mouth daily. 14 each 0  . tamsulosin (FLOMAX) 0.4 MG CAPS capsule Take 0.4 mg by mouth daily.     No current facility-administered medications for this visit.    PHYSICAL EXAMINATION: ECOG PERFORMANCE STATUS: 1 - Symptomatic but completely ambulatory  Vitals:   12/18/19 1114  BP: 140/60  Pulse: 85  Resp: 18  Temp: 99.2 F (37.3 C)  SpO2: 100%   Filed Weights   12/18/19 1114  Weight: 219 lb 6.4 oz (99.5 kg)    GENERAL:alert, no distress and comfortable SKIN: skin color, texture, turgor are normal, no rashes or significant lesions EYES: normal, Conjunctiva are pink and non-injected, sclera clear OROPHARYNX:no exudate, no erythema and lips, buccal mucosa, and tongue normal  NECK: supple, thyroid normal size, non-tender, without nodularity LYMPH:  no palpable lymphadenopathy in the cervical, axillary or inguinal LUNGS: clear to auscultation and percussion with normal breathing effort HEART: regular rate & rhythm and no murmurs with moderate bilateral lower extremity edema ABDOMEN:abdomen  soft, non-tender and normal bowel sounds Musculoskeletal:no cyanosis of digits and no clubbing  NEURO: alert & oriented x 3 with fluent speech, no focal motor/sensory deficits  LABORATORY DATA:  I have reviewed the data as listed    Component Value Date/Time   NA 136 12/18/2019 1046   NA 140 10/09/2016 1106   K 4.6 12/18/2019 1046   K 4.7 10/09/2016 1106   CL 108 12/18/2019 1046   CL 104 03/28/2012 1426   CO2 23 12/18/2019 1046   CO2 28 10/09/2016 1106   GLUCOSE 159 (H) 12/18/2019 1046   GLUCOSE 127 10/09/2016  1106   GLUCOSE 171 (H) 03/28/2012 1426   BUN 22 12/18/2019 1046   BUN 20.8 10/09/2016 1106   CREATININE 1.18 12/18/2019 1046   CREATININE 1.15 09/06/2018 1016   CREATININE 1.1 10/09/2016 1106   CALCIUM 8.6 (L) 12/18/2019 1046   CALCIUM 9.1 10/09/2016 1106   PROT 6.1 (L) 12/18/2019 1046   PROT 5.8 (L) 10/09/2016 1106   ALBUMIN 3.2 (L) 12/18/2019 1046   ALBUMIN 3.1 (L) 10/09/2016 1106   AST 18 12/18/2019 1046   AST 18 09/06/2018 1016   AST 17 10/09/2016 1106   ALT 9 12/18/2019 1046   ALT 12 09/06/2018 1016   ALT 8 10/09/2016 1106   ALKPHOS 49 12/18/2019 1046   ALKPHOS 46 10/09/2016 1106   BILITOT 0.3 12/18/2019 1046   BILITOT 0.3 09/06/2018 1016   BILITOT 0.23 10/09/2016 1106   GFRNONAA 60 (L) 12/18/2019 1046   GFRNONAA 57 (L) 09/06/2018 1016   GFRAA 59 (L) 09/12/2019 1034   GFRAA >60 09/06/2018 1016    No results found for: SPEP, UPEP  Lab Results  Component Value Date   WBC 8.6 12/18/2019   NEUTROABS 4.1 12/18/2019   HGB 10.5 (L) 12/18/2019   HCT 32.7 (L) 12/18/2019   MCV 86.7 12/18/2019   PLT 263 12/18/2019      Chemistry      Component Value Date/Time   NA 136 12/18/2019 1046   NA 140 10/09/2016 1106   K 4.6 12/18/2019 1046   K 4.7 10/09/2016 1106   CL 108 12/18/2019 1046   CL 104 03/28/2012 1426   CO2 23 12/18/2019 1046   CO2 28 10/09/2016 1106   BUN 22 12/18/2019 1046   BUN 20.8 10/09/2016 1106   CREATININE 1.18 12/18/2019 1046   CREATININE 1.15 09/06/2018 1016   CREATININE 1.1 10/09/2016 1106      Component Value Date/Time   CALCIUM 8.6 (L) 12/18/2019 1046   CALCIUM 9.1 10/09/2016 1106   ALKPHOS 49 12/18/2019 1046   ALKPHOS 46 10/09/2016 1106   AST 18 12/18/2019 1046   AST 18 09/06/2018 1016   AST 17 10/09/2016 1106   ALT 9 12/18/2019 1046   ALT 12 09/06/2018 1016   ALT 8 10/09/2016 1106   BILITOT 0.3 12/18/2019 1046   BILITOT 0.3 09/06/2018 1016   BILITOT 0.23 10/09/2016 1106

## 2019-12-18 NOTE — Assessment & Plan Note (Signed)
Anemia is stable It is likely related to chronic kidney disease and his underlying bone marrow disorder He is not symptomatic Observe only 

## 2019-12-18 NOTE — Telephone Encounter (Signed)
Scheduled appts per 11/22 sch msg. Gave pt a print out of AVS.

## 2019-12-18 NOTE — Assessment & Plan Note (Signed)
He tolerated treatment very well Lymphocytosis is resolved His hemoglobin is stable We will continue treatment without dose adjustment I will see him back 3 months for follow-up I do not plan surveillance imaging studies unless he have signs or symptoms to suggest cancer progression I recommend the patient to proceed with Covid vaccination and influenza vaccination but he declined

## 2019-12-18 NOTE — Assessment & Plan Note (Signed)
His renal function is stable We discussed the importance of risk factor modification

## 2019-12-25 MED FILL — IMBRUVICA 280 MG TAB: 280 | 28 days supply | Qty: 28 | Fill #6

## 2020-01-18 MED FILL — IMBRUVICA 280 MG TAB: 280 | 28 days supply | Qty: 28 | Fill #7

## 2020-01-22 DIAGNOSIS — R69 Illness, unspecified: Secondary | ICD-10-CM | POA: Diagnosis not present

## 2020-02-15 ENCOUNTER — Telehealth: Payer: Self-pay

## 2020-02-15 MED FILL — IMBRUVICA 280 MG TAB: 280 | 28 days supply | Qty: 28 | Fill #8

## 2020-02-15 NOTE — Telephone Encounter (Signed)
Oral Oncology Patient Advocate Encounter  Was successful in securing patient a $8000 grant from [employer] to provide copayment coverage for Imbruvica.  This will keep the out of pocket expense at $0.     [employer] ID: [id number]  I have spoken with the patient.   The billing information is as follows and has been shared with [place]: [id number] PCN: [id number] Member ID: [id number] Group ID: [id number] Dates of Eligibility: 01/16/20 through 01/14/21  Fund:  [employer] [place] Phone [phone] Fax [phone] 02/15/2020 9:02 AM

## 2020-02-29 DIAGNOSIS — R7989 Other specified abnormal findings of blood chemistry: Secondary | ICD-10-CM | POA: Diagnosis not present

## 2020-02-29 DIAGNOSIS — I10 Essential (primary) hypertension: Secondary | ICD-10-CM | POA: Diagnosis not present

## 2020-02-29 DIAGNOSIS — E118 Type 2 diabetes mellitus with unspecified complications: Secondary | ICD-10-CM | POA: Diagnosis not present

## 2020-02-29 DIAGNOSIS — E785 Hyperlipidemia, unspecified: Secondary | ICD-10-CM | POA: Diagnosis not present

## 2020-03-11 DIAGNOSIS — K219 Gastro-esophageal reflux disease without esophagitis: Secondary | ICD-10-CM | POA: Diagnosis not present

## 2020-03-11 DIAGNOSIS — R609 Edema, unspecified: Secondary | ICD-10-CM | POA: Diagnosis not present

## 2020-03-11 DIAGNOSIS — E785 Hyperlipidemia, unspecified: Secondary | ICD-10-CM | POA: Diagnosis not present

## 2020-03-11 DIAGNOSIS — Z794 Long term (current) use of insulin: Secondary | ICD-10-CM | POA: Diagnosis not present

## 2020-03-11 DIAGNOSIS — I10 Essential (primary) hypertension: Secondary | ICD-10-CM | POA: Diagnosis not present

## 2020-03-11 DIAGNOSIS — Z79899 Other long term (current) drug therapy: Secondary | ICD-10-CM | POA: Diagnosis not present

## 2020-03-11 DIAGNOSIS — Z0000 Encounter for general adult medical examination without abnormal findings: Secondary | ICD-10-CM | POA: Diagnosis not present

## 2020-03-11 DIAGNOSIS — E1122 Type 2 diabetes mellitus with diabetic chronic kidney disease: Secondary | ICD-10-CM | POA: Diagnosis not present

## 2020-03-11 DIAGNOSIS — R7989 Other specified abnormal findings of blood chemistry: Secondary | ICD-10-CM | POA: Diagnosis not present

## 2020-03-11 DIAGNOSIS — N182 Chronic kidney disease, stage 2 (mild): Secondary | ICD-10-CM | POA: Diagnosis not present

## 2020-03-12 MED FILL — IMBRUVICA 280 MG TAB: 280 | 28 days supply | Qty: 28 | Fill #9

## 2020-03-18 ENCOUNTER — Inpatient Hospital Stay: Payer: Medicare HMO | Attending: Hematology and Oncology | Admitting: Hematology and Oncology

## 2020-03-18 ENCOUNTER — Inpatient Hospital Stay: Payer: Medicare HMO

## 2020-03-18 ENCOUNTER — Other Ambulatory Visit: Payer: Self-pay

## 2020-03-18 ENCOUNTER — Telehealth: Payer: Self-pay | Admitting: Hematology and Oncology

## 2020-03-18 ENCOUNTER — Encounter: Payer: Self-pay | Admitting: Hematology and Oncology

## 2020-03-18 DIAGNOSIS — C9110 Chronic lymphocytic leukemia of B-cell type not having achieved remission: Secondary | ICD-10-CM | POA: Diagnosis not present

## 2020-03-18 DIAGNOSIS — E119 Type 2 diabetes mellitus without complications: Secondary | ICD-10-CM

## 2020-03-18 DIAGNOSIS — Z79899 Other long term (current) drug therapy: Secondary | ICD-10-CM | POA: Insufficient documentation

## 2020-03-18 DIAGNOSIS — E1122 Type 2 diabetes mellitus with diabetic chronic kidney disease: Secondary | ICD-10-CM | POA: Diagnosis not present

## 2020-03-18 DIAGNOSIS — N189 Chronic kidney disease, unspecified: Secondary | ICD-10-CM | POA: Insufficient documentation

## 2020-03-18 DIAGNOSIS — E1165 Type 2 diabetes mellitus with hyperglycemia: Secondary | ICD-10-CM | POA: Insufficient documentation

## 2020-03-18 DIAGNOSIS — D630 Anemia in neoplastic disease: Secondary | ICD-10-CM

## 2020-03-18 DIAGNOSIS — Z794 Long term (current) use of insulin: Secondary | ICD-10-CM | POA: Diagnosis not present

## 2020-03-18 LAB — CBC WITH DIFFERENTIAL/PLATELET
Abs Immature Granulocytes: 0.06 10*3/uL (ref 0.00–0.07)
Basophils Absolute: 0.1 10*3/uL (ref 0.0–0.1)
Basophils Relative: 1 %
Eosinophils Absolute: 0.1 10*3/uL (ref 0.0–0.5)
Eosinophils Relative: 1 %
HCT: 32.3 % — ABNORMAL LOW (ref 39.0–52.0)
Hemoglobin: 10.6 g/dL — ABNORMAL LOW (ref 13.0–17.0)
Immature Granulocytes: 1 %
Lymphocytes Relative: 38 %
Lymphs Abs: 3.9 10*3/uL (ref 0.7–4.0)
MCH: 27.9 pg (ref 26.0–34.0)
MCHC: 32.8 g/dL (ref 30.0–36.0)
MCV: 85 fL (ref 80.0–100.0)
Monocytes Absolute: 0.5 10*3/uL (ref 0.1–1.0)
Monocytes Relative: 5 %
Neutro Abs: 5.6 10*3/uL (ref 1.7–7.7)
Neutrophils Relative %: 54 %
Platelets: 234 10*3/uL (ref 150–400)
RBC: 3.8 MIL/uL — ABNORMAL LOW (ref 4.22–5.81)
RDW: 15.2 % (ref 11.5–15.5)
WBC: 10.2 10*3/uL (ref 4.0–10.5)
nRBC: 0 % (ref 0.0–0.2)

## 2020-03-18 LAB — COMPREHENSIVE METABOLIC PANEL
ALT: 11 U/L (ref 0–44)
AST: 17 U/L (ref 15–41)
Albumin: 3.3 g/dL — ABNORMAL LOW (ref 3.5–5.0)
Alkaline Phosphatase: 49 U/L (ref 38–126)
Anion gap: 9 (ref 5–15)
BUN: 22 mg/dL (ref 8–23)
CO2: 23 mmol/L (ref 22–32)
Calcium: 8.7 mg/dL — ABNORMAL LOW (ref 8.9–10.3)
Chloride: 107 mmol/L (ref 98–111)
Creatinine, Ser: 1.21 mg/dL (ref 0.61–1.24)
GFR, Estimated: 58 mL/min — ABNORMAL LOW (ref >60)
Glucose, Bld: 185 mg/dL — ABNORMAL HIGH (ref 70–99)
Potassium: 5 mmol/L (ref 3.5–5.1)
Sodium: 139 mmol/L (ref 135–145)
Total Bilirubin: 0.3 mg/dL (ref 0.3–1.2)
Total Protein: 6.3 g/dL — ABNORMAL LOW (ref 6.5–8.1)

## 2020-03-18 NOTE — Assessment & Plan Note (Signed)
Anemia is stable It is likely related to chronic kidney disease and his underlying bone marrow disorder He is not symptomatic Observe only 

## 2020-03-18 NOTE — Assessment & Plan Note (Signed)
He has poorly controlled diabetes His last hemoglobin A1c done recently at his primary care doctor's office was 8% We discussed the importance of getting his diabetes under control while on treatment

## 2020-03-18 NOTE — Telephone Encounter (Signed)
Scheduled appts per 2/21 sch msg. Gave pt a printout of AVS.  

## 2020-03-18 NOTE — Progress Notes (Signed)
[facility] OFFICE PROGRESS NOTE  Patient Care Team: Janie Morning, DO as PCP - General (Family Medicine) Heath Lark, MD as Consulting Physician (Hematology and Oncology)  ASSESSMENT & PLAN:  CLL (chronic lymphocytic leukemia) (Michael [name]) He tolerated treatment very well Lymphocytosis is resolved His hemoglobin is stable We will continue treatment without dose adjustment I will see him back 3 months for follow-up I do not plan surveillance imaging studies unless he have signs or symptoms to suggest cancer progression  Anemia in neoplastic disease Anemia is stable It is likely related to chronic kidney disease and his underlying bone marrow disorder He is not symptomatic Observe only  Type II diabetes mellitus ([place]) He has poorly controlled diabetes His last hemoglobin A1c done recently at his primary care doctor's office was 8% We discussed the importance of getting his diabetes under control while on treatment   No orders of the defined types were placed in this encounter.   All questions were answered. The patient knows to call the clinic with any problems, questions or concerns. The total time spent in the appointment was 20 minutes encounter with patients including review of chart and various tests results, discussions about plan of care and coordination of care plan   Heath Lark, MD 03/18/2020 2:58 PM  INTERVAL HISTORY: Please see below for problem oriented charting. He returns for further follow-up He is doing well He is compliant taking his medications as directed No new lymphadenopathy No recent infection, fever or chills Denies abnormal heart palpitation  SUMMARY OF ONCOLOGIC HISTORY: Oncology History  CLL (chronic lymphocytic leukemia) ([place])  04/03/2008 Pathology Results   Case #: [id number] FLow cytomtry confirmed CLL   10/11/2014 Tumor Marker   FISH analysis showed trisomy 12 abnormality   04/29/2015 -  Chemotherapy   He started taking Ibrutinib    04/29/2015 Imaging   Ct scan showed mild lymphadenopathy and splenomegaly   05/13/2015 Adverse Reaction   Due to neutropenia, dose of Ibrutinib is reduced to 280 mg daily   01/08/2016 Imaging   Response to therapy. Resolution of thoracic and abdominal adenopathy. Resolution of splenomegaly. 2. No new or progressive disease. 3. Small bowel containing left inguinal hernia and right-sided hydrocele, as before. 4. Esophageal air fluid level suggests dysmotility or gastroesophageal reflux. 5. Possible constipation. 6. Suspect hepatic hemangiomas, similar.   10/09/2016 Imaging   No findings suspicious for active lymphomatous involvement.  No abdominopelvic lymphadenopathy. Spleen is normal in size.  Moderate to large left inguinal/scrotal hernia containing small bowel and fat. No evidence of bowel obstruction.  Additional stable ancillary findings as above.   04/26/2017 Imaging   1. No lymphadenopathy in the chest, abdomen, or pelvis. 2. Mild circumferential bladder wall thickening. Incomplete distention may contribute to this appearance, but bladder infection is also consideration. 3. Stable appearance small hepatic lesions. Imaging features most suggestive of benign cavernous hemangiomas. 4. Stable tiny low-density renal lesions compatible with cysts. 5. Large left groin hernia contains numerous small bowel loops without complicating features. 6. Right-sided hydrocele.   11/15/2017 Imaging   Large recurrent left inguinal hernia with extension of multiple small bowel loops into the inguinal canal and scrotum on the left. Proximal small bowel dilatation is noted consistent with a degree of incarceration.  Bilateral small effusions as well as patchy bibasilar infiltrates.    12/14/2017 Adverse Reaction   Ibrutinib placed on hold     REVIEW OF SYSTEMS:   Constitutional: Denies fevers, chills or abnormal weight loss Eyes: Denies blurriness of  vision Ears, nose, mouth, throat, and face:  Denies mucositis or sore throat Respiratory: Denies cough, dyspnea or wheezes Cardiovascular: Denies palpitation, chest discomfort or lower extremity swelling Gastrointestinal:  Denies nausea, heartburn or change in bowel habits Skin: Denies abnormal skin rashes Lymphatics: Denies new lymphadenopathy or easy bruising Neurological:Denies numbness, tingling or new weaknesses Behavioral/Psych: Mood is stable, no new changes  All other systems were reviewed with the patient and are negative.  I have reviewed the past medical history, past surgical history, social history and family history with the patient and they are unchanged from previous note.  ALLERGIES:  has No Known Allergies.  MEDICATIONS:  Current Outpatient Medications  Medication Sig Dispense Refill   acetaminophen (TYLENOL) 650 MG CR tablet Take 1 tablet (650 mg total) by mouth 2 (two) times daily as needed for pain. 30 tablet 0   aspirin EC 81 MG tablet Take 81 mg by mouth daily.     Capsaicin (ASPERCREME PAIN RELIEF PATCH EX) Apply 1 patch topically daily. Apply 1 patch to left shoulder 12 hrs/day. On at 8 am, off at 8 pm     Cholecalciferol (VITAMIN D3) 1000 units CAPS Take 1,000 Units by mouth 2 (two) times daily.      collagenase (SANTYL) ointment Apply 1 application topically daily.     Cyanocobalamin (VITAMIN B 12 PO) Take 1,000 mcg by mouth daily.     gabapentin (NEURONTIN) 100 MG capsule Take 100 mg by mouth 3 (three) times daily.     IMBRUVICA 280 MG TABS TAKE 1 TABLET (280MG ) BY MOUTH DAILY. 28 tablet 11   insulin NPH-regular Human (70-30) 100 UNIT/ML injection Inject 15 Units into the skin 2 (two) times daily with a meal.     mirtazapine (REMERON) 15 MG tablet Take 1 tablet (15 mg total) by mouth at bedtime.     Multiple Vitamin (MULTIVITAMIN WITH MINERALS) TABS tablet Take 1 tablet by mouth daily.     Nutritional Supplements (GLUCERNA 1.0 CAL) LIQD Take 30 Cans by mouth 2 (two) times daily.      pantoprazole (PROTONIX) 40 MG tablet Take 40 mg by mouth daily.     Polyethyl Glycol-Propyl Glycol (SYSTANE OP) Place 1 drop into both eyes 2 (two) times daily.     polyethylene glycol (MIRALAX / GLYCOLAX) packet Take 17 g by mouth daily. 14 each 0   ramipril (ALTACE) 5 MG capsule Take 5 mg by mouth daily.     tamsulosin (FLOMAX) 0.4 MG CAPS capsule Take 0.4 mg by mouth daily.     torsemide (DEMADEX) 20 MG tablet Take 20 mg by mouth daily.     No current facility-administered medications for this visit.    PHYSICAL EXAMINATION: ECOG PERFORMANCE STATUS: 1 - Symptomatic but completely ambulatory  Vitals:   03/18/20 1030  BP: (!) 141/56  Pulse: 81  Resp: 18  Temp: 98.5 F (36.9 C)  SpO2: 100%   Filed Weights   03/18/20 1030  Weight: 218 lb 3.2 oz (99 kg)    GENERAL:alert, no distress and comfortable SKIN: skin color, texture, turgor are normal, no rashes or significant lesions EYES: normal, Conjunctiva are pink and non-injected, sclera clear OROPHARYNX:no exudate, no erythema and lips, buccal mucosa, and tongue normal  NECK: supple, thyroid normal size, non-tender, without nodularity LYMPH:  no palpable lymphadenopathy in the cervical, axillary or inguinal LUNGS: clear to auscultation and percussion with normal breathing effort HEART: regular rate & rhythm and no murmurs with bilateral lower extremity edema ABDOMEN:abdomen soft, non-tender  and normal bowel sounds Musculoskeletal:no cyanosis of digits and no clubbing  NEURO: alert & oriented x 3 with fluent speech, no focal motor/sensory deficits  LABORATORY DATA:  I have reviewed the data as listed    Component Value Date/Time   NA 139 03/18/2020 0947   NA 140 10/09/2016 1106   K 5.0 03/18/2020 0947   K 4.7 10/09/2016 1106   CL 107 03/18/2020 0947   CL 104 03/28/2012 1426   CO2 23 03/18/2020 0947   CO2 28 10/09/2016 1106   GLUCOSE 185 (H) 03/18/2020 0947   GLUCOSE 127 10/09/2016 1106   GLUCOSE 171 (H) 03/28/2012  1426   BUN 22 03/18/2020 0947   BUN 20.8 10/09/2016 1106   CREATININE 1.21 03/18/2020 0947   CREATININE 1.15 09/06/2018 1016   CREATININE 1.1 10/09/2016 1106   CALCIUM 8.7 (L) 03/18/2020 0947   CALCIUM 9.1 10/09/2016 1106   PROT 6.3 (L) 03/18/2020 0947   PROT 5.8 (L) 10/09/2016 1106   ALBUMIN 3.3 (L) 03/18/2020 0947   ALBUMIN 3.1 (L) 10/09/2016 1106   AST 17 03/18/2020 0947   AST 18 09/06/2018 1016   AST 17 10/09/2016 1106   ALT 11 03/18/2020 0947   ALT 12 09/06/2018 1016   ALT 8 10/09/2016 1106   ALKPHOS 49 03/18/2020 0947   ALKPHOS 46 10/09/2016 1106   BILITOT 0.3 03/18/2020 0947   BILITOT 0.3 09/06/2018 1016   BILITOT 0.23 10/09/2016 1106   GFRNONAA 58 (L) 03/18/2020 0947   GFRNONAA 57 (L) 09/06/2018 1016   GFRAA 59 (L) 09/12/2019 1034   GFRAA >60 09/06/2018 1016    No results found for: SPEP, UPEP  Lab Results  Component Value Date   WBC 10.2 03/18/2020   NEUTROABS 5.6 03/18/2020   HGB 10.6 (L) 03/18/2020   HCT 32.3 (L) 03/18/2020   MCV 85.0 03/18/2020   PLT 234 03/18/2020      Chemistry      Component Value Date/Time   NA 139 03/18/2020 0947   NA 140 10/09/2016 1106   K 5.0 03/18/2020 0947   K 4.7 10/09/2016 1106   CL 107 03/18/2020 0947   CL 104 03/28/2012 1426   CO2 23 03/18/2020 0947   CO2 28 10/09/2016 1106   BUN 22 03/18/2020 0947   BUN 20.8 10/09/2016 1106   CREATININE 1.21 03/18/2020 0947   CREATININE 1.15 09/06/2018 1016   CREATININE 1.1 10/09/2016 1106      Component Value Date/Time   CALCIUM 8.7 (L) 03/18/2020 0947   CALCIUM 9.1 10/09/2016 1106   ALKPHOS 49 03/18/2020 0947   ALKPHOS 46 10/09/2016 1106   AST 17 03/18/2020 0947   AST 18 09/06/2018 1016   AST 17 10/09/2016 1106   ALT 11 03/18/2020 0947   ALT 12 09/06/2018 1016   ALT 8 10/09/2016 1106   BILITOT 0.3 03/18/2020 0947   BILITOT 0.3 09/06/2018 1016   BILITOT 0.23 10/09/2016 1106

## 2020-03-18 NOTE — Assessment & Plan Note (Signed)
He tolerated treatment very well Lymphocytosis is resolved His hemoglobin is stable We will continue treatment without dose adjustment I will see him back 3 months for follow-up I do not plan surveillance imaging studies unless he have signs or symptoms to suggest cancer progression

## 2020-03-27 ENCOUNTER — Encounter (INDEPENDENT_AMBULATORY_CARE_PROVIDER_SITE_OTHER): Payer: Self-pay

## 2020-03-29 DIAGNOSIS — Z008 Encounter for other general examination: Secondary | ICD-10-CM | POA: Diagnosis not present

## 2020-03-29 DIAGNOSIS — G8929 Other chronic pain: Secondary | ICD-10-CM | POA: Diagnosis not present

## 2020-03-29 DIAGNOSIS — I959 Hypotension, unspecified: Secondary | ICD-10-CM | POA: Diagnosis not present

## 2020-03-29 DIAGNOSIS — I252 Old myocardial infarction: Secondary | ICD-10-CM | POA: Diagnosis not present

## 2020-03-29 DIAGNOSIS — I10 Essential (primary) hypertension: Secondary | ICD-10-CM | POA: Diagnosis not present

## 2020-03-29 DIAGNOSIS — Z794 Long term (current) use of insulin: Secondary | ICD-10-CM | POA: Diagnosis not present

## 2020-03-29 DIAGNOSIS — E669 Obesity, unspecified: Secondary | ICD-10-CM | POA: Diagnosis not present

## 2020-03-29 DIAGNOSIS — C9510 Chronic leukemia of unspecified cell type not having achieved remission: Secondary | ICD-10-CM | POA: Diagnosis not present

## 2020-03-29 DIAGNOSIS — E1143 Type 2 diabetes mellitus with diabetic autonomic (poly)neuropathy: Secondary | ICD-10-CM | POA: Diagnosis not present

## 2020-03-29 DIAGNOSIS — K219 Gastro-esophageal reflux disease without esophagitis: Secondary | ICD-10-CM | POA: Diagnosis not present

## 2020-03-29 DIAGNOSIS — D84821 Immunodeficiency due to drugs: Secondary | ICD-10-CM | POA: Diagnosis not present

## 2020-04-05 DIAGNOSIS — M5416 Radiculopathy, lumbar region: Secondary | ICD-10-CM | POA: Diagnosis not present

## 2020-04-23 ENCOUNTER — Other Ambulatory Visit (HOSPITAL_COMMUNITY): Payer: Self-pay

## 2020-04-23 ENCOUNTER — Ambulatory Visit (INDEPENDENT_AMBULATORY_CARE_PROVIDER_SITE_OTHER): Payer: Medicare HMO | Admitting: Ophthalmology

## 2020-04-23 ENCOUNTER — Other Ambulatory Visit: Payer: Self-pay

## 2020-04-23 ENCOUNTER — Encounter (INDEPENDENT_AMBULATORY_CARE_PROVIDER_SITE_OTHER): Payer: Self-pay | Admitting: Ophthalmology

## 2020-04-23 DIAGNOSIS — E113551 Type 2 diabetes mellitus with stable proliferative diabetic retinopathy, right eye: Secondary | ICD-10-CM | POA: Diagnosis not present

## 2020-04-23 DIAGNOSIS — E113592 Type 2 diabetes mellitus with proliferative diabetic retinopathy without macular edema, left eye: Secondary | ICD-10-CM | POA: Diagnosis not present

## 2020-04-23 NOTE — Assessment & Plan Note (Signed)
PDR OS while quiet has room temporally and superiorly for completion so as to event progression to disease in this local area as the patient requires more wisdom

## 2020-04-23 NOTE — Assessment & Plan Note (Signed)
1 day need completion of PRP in the temporal window,

## 2020-04-23 NOTE — Progress Notes (Signed)
04/23/2020     CHIEF COMPLAINT Patient presents for Retina Follow Up (6 Month diabetic f\u OU. FP and PRP OS/Pt thinks seasonal allergies are causing vision to be blurry./BGL: 130/A1C: 8's)   HISTORY OF PRESENT ILLNESS: Michael [name] is a 87 y.o. male who presents to the clinic today for:   HPI    Retina Follow Up    Patient presents with  Diabetic Retinopathy.  In both eyes.  Severity is moderate.  Duration of 6 months.  Since onset it is stable.  I, the attending physician,  performed the HPI with the patient and updated documentation appropriately. Additional comments: 6 Month diabetic f\u OU. FP and PRP OS Pt thinks seasonal allergies are causing vision to be blurry. BGL: 130 A1C: 8's       Last edited by Tilda Franco on 04/23/2020 10:51 AM. (History)      Referring physician: Jani Gravel, MD [address] [place],  [place] [address]  HISTORICAL INFORMATION:   Selected notes from the MEDICAL RECORD NUMBER    Lab Results  Component Value Date   HGBA1C 7.0 (H) 11/04/2017     CURRENT MEDICATIONS: Current Outpatient Medications (Ophthalmic Drugs)  Medication Sig  . Polyethyl Glycol-Propyl Glycol (SYSTANE OP) Place 1 drop into both eyes 2 (two) times daily.   No current facility-administered medications for this visit. (Ophthalmic Drugs)   Current Outpatient Medications (Other)  Medication Sig  . acetaminophen (TYLENOL) 650 MG CR tablet Take 1 tablet (650 mg total) by mouth 2 (two) times daily as needed for pain.  Marland Kitchen aspirin EC 81 MG tablet Take 81 mg by mouth daily.  . Capsaicin (ASPERCREME PAIN RELIEF PATCH EX) Apply 1 patch topically daily. Apply 1 patch to left shoulder 12 hrs/day. On at 8 am, off at 8 pm  . Cholecalciferol (VITAMIN D3) 1000 units CAPS Take 1,000 Units by mouth 2 (two) times daily.   . collagenase (SANTYL) ointment Apply 1 application topically daily.  . Cyanocobalamin (VITAMIN B 12 PO) Take 1,000 mcg by mouth daily.  Marland Kitchen  gabapentin (NEURONTIN) 100 MG capsule Take 100 mg by mouth 3 (three) times daily.  . IMBRUVICA 280 MG TABS TAKE 1 TABLET (280MG ) BY MOUTH DAILY.  Marland Kitchen insulin NPH-regular Human (70-30) 100 UNIT/ML injection Inject 15 Units into the skin 2 (two) times daily with a meal.  . mirtazapine (REMERON) 15 MG tablet Take 1 tablet (15 mg total) by mouth at bedtime.  . Multiple Vitamin (MULTIVITAMIN WITH MINERALS) TABS tablet Take 1 tablet by mouth daily.  . Nutritional Supplements (GLUCERNA 1.0 CAL) LIQD Take 30 Cans by mouth 2 (two) times daily.  . pantoprazole (PROTONIX) 40 MG tablet Take 40 mg by mouth daily.  . polyethylene glycol (MIRALAX / GLYCOLAX) packet Take 17 g by mouth daily.  . ramipril (ALTACE) 5 MG capsule Take 5 mg by mouth daily.  . tamsulosin (FLOMAX) 0.4 MG CAPS capsule Take 0.4 mg by mouth daily.  Marland Kitchen torsemide (DEMADEX) 20 MG tablet Take 20 mg by mouth daily.   No current facility-administered medications for this visit. (Other)      REVIEW OF SYSTEMS: ROS    Positive for: Endocrine   Last edited by Tilda Franco on 04/23/2020 10:51 AM. (History)       ALLERGIES No Known Allergies  PAST MEDICAL HISTORY Past Medical History:  Diagnosis Date  . Anemia   . Anginal pain (H[facility])    upon exertion  . Arthritis    "  left shoulder, lower back" (10/20/2016)  . CLL (chronic lymphoblastic leukemia) dx'd 2000  . Edema leg   . Family history of adverse reaction to anesthesia    "daughter's BP drops"   . GERD (gastroesophageal reflux disease)   . History of hiatal hernia   . Leukemia, chronic lymphoid ([place]) 12/08/2010  . Lymphocytosis   . Type II diabetes mellitus ([place])    Past Surgical History:  Procedure Laterality Date  . BOWEL RESECTION N/A 11/15/2017   Procedure: SMALL BOWEL RESECTION;  Surgeon: Coralie Keens, MD;  Location: [facility];  Service: General;  Laterality: N/A;  . CATARACT EXTRACTION W/ INTRAOCULAR LENS  IMPLANT, BILATERAL Bilateral   . EYE SURGERY      Cataracts (bilateral)  . GROIN DISSECTION Left 11/15/2017   Procedure: GROIN EXPLORATION WITH REMOVAL OF INGUINAL HERNIA MESH;  Surgeon: Coralie Keens, MD;  Location: [facility];  Service: General;  Laterality: Left;  . INGUINAL HERNIA REPAIR Left 11/10/2017   Procedure: LEFT INGUINAL HERNIA REPAIR ERAS PATHWAY;  Surgeon: Coralie Keens, MD;  Location: [facility];  Service: General;  Laterality: Left;  . INGUINAL HERNIA REPAIR Left 11/15/2017   Procedure: LEFT INGUINAL HERNIA REPAIR WITH MESH;  Surgeon: Coralie Keens, MD;  Location: [facility];  Service: General;  Laterality: Left;  . INSERTION OF MESH Left 11/10/2017   Procedure: INSERTION OF MESH;  Surgeon: Coralie Keens, MD;  Location: [facility];  Service: General;  Laterality: Left;    FAMILY HISTORY Family History  Problem Relation Age of Onset  . Cancer Mother   . Hypertension Father   . Diabetes Father     SOCIAL HISTORY Social History   Tobacco Use  . Smoking status: Never Smoker  . Smokeless tobacco: Former Systems developer    Types: Snuff  Vaping Use  . Vaping Use: Never used  Substance Use Topics  . Alcohol use: Not Currently  . Drug use: No         OPHTHALMIC EXAM: Base Eye Exam    Visual Acuity (Snellen - Linear)      Right Left   Dist cc 20/25 + 20/100   Dist ph cc  20/80 -1   Correction: Glasses       Tonometry (Tonopen, 10:56 AM)      Right Left   Pressure 18 22       Pupils      Pupils Dark Light Shape React APD   Right PERRL 3 3 Round Minimal None   Left PERRL 3 3 Round Minimal None       Visual Fields (Counting fingers)      Left Right    Full Full       Neuro/Psych    Oriented x3: Yes   Mood/Affect: Normal       Dilation    Both eyes: 1.0% Mydriacyl, 2.5% Phenylephrine @ 10:56 AM        Slit Lamp and Fundus Exam    External Exam      Right Left   External Normal Normal       Slit Lamp Exam      Right Left   Lids/Lashes Normal Normal   Conjunctiva/Sclera White and quiet White and  quiet   Cornea Clear Clear   Anterior Chamber Deep and quiet Deep and quiet   Iris Round and reactive Round and reactive   Lens Centered posterior chamber intraocular lens Centered posterior chamber intraocular lens   Anterior Vitreous Normal Normal  Fundus Exam      Right Left   Posterior Vitreous Normal Posterior vitreous detachment   Disc Normal Normal   C/D Ratio 0.5 0.5   Macula Microaneurysms, no clinically significant macular edema Microaneurysms   Vessels PDR-quiet PDR-quiet   Periphery room temp for laser room sup for prp          IMAGING AND PROCEDURES  Imaging and Procedures for 04/23/20  Color Fundus Photography Optos - OU - Both Eyes       Right Eye Disc findings include normal observations. Macula : microaneurysms.   Left Eye Progression has been stable. Disc findings include normal observations. Macula : microaneurysms.   Notes Good PRP nasal to 70 degrees.  Open temporal window for more PRP OD in the future  OS, good PRP yet room temporally and superiorly and nasally for completion                  ASSESSMENT/PLAN:  Proliferative diabetic retinopathy of left eye ([place]) PDR OS while quiet has room temporally and superiorly for completion so as to event progression to disease in this local area as the patient requires more wisdom  Stable treated proliferative diabetic retinopathy of right eye determined by examination associated with type 2 diabetes mellitus ([place]) 1 day need completion of PRP in the temporal window,      ICD-10-CM   1. Proliferative diabetic retinopathy of left eye without macular edema associated with type 2 diabetes mellitus (HCC)  F81.0175 Color Fundus Photography Optos - OU - Both Eyes  2. Stable treated proliferative diabetic retinopathy of right eye determined by examination associated with type 2 diabetes mellitus ([place])  Z02.5852 Color Fundus Photography Optos - OU - Both Eyes    1.  OU, with incompletely treated  PDR will need completion of PRP next OS 2.  Dilate OS only next  3.  Ophthalmic Meds Ordered this visit:  No orders of the defined types were placed in this encounter.      Return in about 3 months (around 07/24/2020) for dilate, OS, PRP.  There are no Patient Instructions on file for this visit.   Explained the diagnoses, plan, and follow up with the patient and they expressed understanding.  Patient expressed understanding of the importance of proper follow up care.   Clent Demark Azure Barrales M.D. Diseases & Surgery of the Retina and Vitreous Retina & Diabetic [place] 04/23/20     Abbreviations: M myopia (nearsighted); A astigmatism; H hyperopia (farsighted); P presbyopia; Mrx spectacle prescription;  CTL contact lenses; OD right eye; OS left eye; OU both eyes  XT exotropia; ET esotropia; PEK punctate epithelial keratitis; PEE punctate epithelial erosions; DES dry eye syndrome; MGD meibomian gland dysfunction; ATs artificial tears; PFAT's preservative free artificial tears; [place] nuclear sclerotic cataract; PSC posterior subcapsular cataract; ERM epi-retinal membrane; PVD posterior vitreous detachment; RD retinal detachment; DM diabetes mellitus; DR diabetic retinopathy; NPDR non-proliferative diabetic retinopathy; PDR proliferative diabetic retinopathy; CSME clinically significant macular edema; DME diabetic macular edema; dbh dot blot hemorrhages; CWS cotton wool spot; POAG primary open angle glaucoma; C/D cup-to-disc ratio; HVF humphrey visual field; GVF goldmann visual field; OCT optical coherence tomography; IOP intraocular pressure; BRVO Branch retinal vein occlusion; CRVO central retinal vein occlusion; CRAO central retinal artery occlusion; BRAO branch retinal artery occlusion; RT retinal tear; SB scleral buckle; PPV pars plana vitrectomy; VH Vitreous hemorrhage; PRP panretinal laser photocoagulation; IVK intravitreal kenalog; VMT vitreomacular traction; MH Macular hole;  NVD  neovascularization of the  disc; NVE neovascularization elsewhere; AREDS age related eye disease study; ARMD age related macular degeneration; POAG primary open angle glaucoma; EBMD epithelial/anterior basement membrane dystrophy; ACIOL anterior chamber intraocular lens; IOL intraocular lens; PCIOL posterior chamber intraocular lens; Phaco/IOL phacoemulsification with intraocular lens placement; [place] photorefractive keratectomy; LASIK laser assisted in situ keratomileusis; HTN hypertension; DM diabetes mellitus; COPD chronic obstructive pulmonary disease

## 2020-05-07 ENCOUNTER — Other Ambulatory Visit (HOSPITAL_COMMUNITY): Payer: Self-pay

## 2020-05-08 ENCOUNTER — Other Ambulatory Visit (HOSPITAL_COMMUNITY): Payer: Self-pay

## 2020-05-08 MED FILL — Ibrutinib Tab 280 MG: ORAL | 28 days supply | Qty: 28 | Fill #0 | Status: AC

## 2020-05-14 DIAGNOSIS — Z0289 Encounter for other administrative examinations: Secondary | ICD-10-CM | POA: Diagnosis not present

## 2020-05-14 DIAGNOSIS — R350 Frequency of micturition: Secondary | ICD-10-CM | POA: Diagnosis not present

## 2020-05-14 DIAGNOSIS — L84 Corns and callosities: Secondary | ICD-10-CM | POA: Diagnosis not present

## 2020-05-14 DIAGNOSIS — E1143 Type 2 diabetes mellitus with diabetic autonomic (poly)neuropathy: Secondary | ICD-10-CM | POA: Diagnosis not present

## 2020-05-14 DIAGNOSIS — I739 Peripheral vascular disease, unspecified: Secondary | ICD-10-CM | POA: Diagnosis not present

## 2020-05-14 DIAGNOSIS — E1142 Type 2 diabetes mellitus with diabetic polyneuropathy: Secondary | ICD-10-CM | POA: Diagnosis not present

## 2020-06-03 ENCOUNTER — Other Ambulatory Visit (HOSPITAL_COMMUNITY): Payer: Self-pay

## 2020-06-04 ENCOUNTER — Other Ambulatory Visit: Payer: Self-pay | Admitting: Hematology and Oncology

## 2020-06-04 ENCOUNTER — Other Ambulatory Visit (HOSPITAL_COMMUNITY): Payer: Self-pay

## 2020-06-04 MED ORDER — IMBRUVICA 280 MG PO TABS
ORAL_TABLET | ORAL | 11 refills | Status: DC
  Filled 2020-06-04: qty 28, 28d supply, fill #0
  Filled 2020-07-01: qty 28, 28d supply, fill #1
  Filled 2020-07-30: qty 28, 28d supply, fill #2
  Filled 2020-08-22: qty 28, 28d supply, fill #3
  Filled 2020-09-25: qty 28, 28d supply, fill #4
  Filled 2020-10-22: qty 28, 28d supply, fill #5
  Filled 2020-11-19: qty 28, 28d supply, fill #6
  Filled 2020-12-16: qty 28, 28d supply, fill #7
  Filled 2021-01-13: qty 28, 28d supply, fill #8
  Filled 2021-02-07: qty 28, 28d supply, fill #9
  Filled 2021-03-05: qty 28, 28d supply, fill #10
  Filled 2021-04-03: qty 28, 28d supply, fill #11

## 2020-06-05 ENCOUNTER — Other Ambulatory Visit (HOSPITAL_COMMUNITY): Payer: Self-pay

## 2020-06-17 ENCOUNTER — Inpatient Hospital Stay: Payer: Medicare HMO

## 2020-06-17 ENCOUNTER — Inpatient Hospital Stay: Payer: Medicare HMO | Attending: Hematology and Oncology | Admitting: Hematology and Oncology

## 2020-06-17 ENCOUNTER — Telehealth: Payer: Self-pay | Admitting: Hematology and Oncology

## 2020-06-17 ENCOUNTER — Encounter: Payer: Self-pay | Admitting: Hematology and Oncology

## 2020-06-17 ENCOUNTER — Other Ambulatory Visit: Payer: Self-pay

## 2020-06-17 DIAGNOSIS — C9110 Chronic lymphocytic leukemia of B-cell type not having achieved remission: Secondary | ICD-10-CM | POA: Diagnosis not present

## 2020-06-17 DIAGNOSIS — N183 Chronic kidney disease, stage 3 unspecified: Secondary | ICD-10-CM | POA: Insufficient documentation

## 2020-06-17 DIAGNOSIS — R600 Localized edema: Secondary | ICD-10-CM | POA: Diagnosis not present

## 2020-06-17 DIAGNOSIS — D630 Anemia in neoplastic disease: Secondary | ICD-10-CM | POA: Diagnosis not present

## 2020-06-17 LAB — COMPREHENSIVE METABOLIC PANEL
ALT: 9 U/L (ref 0–44)
AST: 13 U/L — ABNORMAL LOW (ref 15–41)
Albumin: 3.3 g/dL — ABNORMAL LOW (ref 3.5–5.0)
Alkaline Phosphatase: 49 U/L (ref 38–126)
Anion gap: 6 (ref 5–15)
BUN: 31 mg/dL — ABNORMAL HIGH (ref 8–23)
CO2: 21 mmol/L — ABNORMAL LOW (ref 22–32)
Calcium: 8.8 mg/dL — ABNORMAL LOW (ref 8.9–10.3)
Chloride: 111 mmol/L (ref 98–111)
Creatinine, Ser: 1.27 mg/dL — ABNORMAL HIGH (ref 0.61–1.24)
GFR, Estimated: 55 mL/min — ABNORMAL LOW (ref >60)
Glucose, Bld: 118 mg/dL — ABNORMAL HIGH (ref 70–99)
Potassium: 4.2 mmol/L (ref 3.5–5.1)
Sodium: 138 mmol/L (ref 135–145)
Total Bilirubin: 0.3 mg/dL (ref 0.3–1.2)
Total Protein: 6.3 g/dL — ABNORMAL LOW (ref 6.5–8.1)

## 2020-06-17 LAB — CBC WITH DIFFERENTIAL/PLATELET
Abs Immature Granulocytes: 0.09 10*3/uL — ABNORMAL HIGH (ref 0.00–0.07)
Basophils Absolute: 0 10*3/uL (ref 0.0–0.1)
Basophils Relative: 0 %
Eosinophils Absolute: 0.2 10*3/uL (ref 0.0–0.5)
Eosinophils Relative: 2 %
HCT: 33.8 % — ABNORMAL LOW (ref 39.0–52.0)
Hemoglobin: 10.8 g/dL — ABNORMAL LOW (ref 13.0–17.0)
Immature Granulocytes: 1 %
Lymphocytes Relative: 43 %
Lymphs Abs: 4.8 10*3/uL — ABNORMAL HIGH (ref 0.7–4.0)
MCH: 27.3 pg (ref 26.0–34.0)
MCHC: 32 g/dL (ref 30.0–36.0)
MCV: 85.6 fL (ref 80.0–100.0)
Monocytes Absolute: 0.5 10*3/uL (ref 0.1–1.0)
Monocytes Relative: 5 %
Neutro Abs: 5.5 10*3/uL (ref 1.7–7.7)
Neutrophils Relative %: 49 %
Platelets: 237 10*3/uL (ref 150–400)
RBC: 3.95 MIL/uL — ABNORMAL LOW (ref 4.22–5.81)
RDW: 15.9 % — ABNORMAL HIGH (ref 11.5–15.5)
WBC: 11 10*3/uL — ABNORMAL HIGH (ref 4.0–10.5)
nRBC: 0 % (ref 0.0–0.2)

## 2020-06-17 NOTE — Progress Notes (Signed)
[place] OFFICE PROGRESS NOTE  Patient Care Team: Janie Morning, DO as PCP - General (Family Medicine) Heath Lark, MD as Consulting Physician (Hematology and Oncology)  ASSESSMENT & PLAN:  CLL (chronic lymphocytic leukemia) ([place]) He tolerated treatment very well Lymphocytosis is stable His hemoglobin is stable We will continue treatment without dose adjustment I will see him back 3 months for follow-up I do not plan surveillance imaging studies unless he have signs or symptoms to suggest cancer progression  Anemia in neoplastic disease Anemia is stable It is likely related to chronic kidney disease and his underlying bone marrow disorder He is not symptomatic Observe only  Chronic kidney disease, stage III (moderate) His renal function is stable We discussed the importance of risk factor modification  Bilateral leg edema He has chronic bilateral lower extremity edema He told me he is on a diuretic therapy   No orders of the defined types were placed in this encounter.   All questions were answered. The patient knows to call the clinic with any problems, questions or concerns. The total time spent in the appointment was 20 minutes encounter with patients including review of chart and various tests results, discussions about plan of care and coordination of care plan   Heath Lark, MD 06/17/2020 11:21 AM  INTERVAL HISTORY: Please see below for problem oriented charting. He returns for further follow-up He feels well No new side effects of Imbruvica He has chronic bruises, stable Denies abnormal heart palpitation or diarrhea His chronic leg swelling is stable  SUMMARY OF ONCOLOGIC HISTORY: Oncology History  CLL (chronic lymphocytic leukemia) ([place])  04/03/2008 Pathology Results   Case #: [id number] FLow cytomtry confirmed CLL   10/11/2014 Tumor Marker   FISH analysis showed trisomy 12 abnormality   04/29/2015 -  Chemotherapy   He started taking  Ibrutinib   04/29/2015 Imaging   Ct scan showed mild lymphadenopathy and splenomegaly   05/13/2015 Adverse Reaction   Due to neutropenia, dose of Ibrutinib is reduced to 280 mg daily   01/08/2016 Imaging   Response to therapy. Resolution of thoracic and abdominal adenopathy. Resolution of splenomegaly. 2. No new or progressive disease. 3. Small bowel containing left inguinal hernia and right-sided hydrocele, as before. 4. Esophageal air fluid level suggests dysmotility or gastroesophageal reflux. 5. Possible constipation. 6. Suspect hepatic hemangiomas, similar.   10/09/2016 Imaging   No findings suspicious for active lymphomatous involvement.  No abdominopelvic lymphadenopathy. Spleen is normal in size.  Moderate to large left inguinal/scrotal hernia containing small bowel and fat. No evidence of bowel obstruction.  Additional stable ancillary findings as above.   04/26/2017 Imaging   1. No lymphadenopathy in the chest, abdomen, or pelvis. 2. Mild circumferential bladder wall thickening. Incomplete distention may contribute to this appearance, but bladder infection is also consideration. 3. Stable appearance small hepatic lesions. Imaging features most suggestive of benign cavernous hemangiomas. 4. Stable tiny low-density renal lesions compatible with cysts. 5. Large left groin hernia contains numerous small bowel loops without complicating features. 6. Right-sided hydrocele.   11/15/2017 Imaging   Large recurrent left inguinal hernia with extension of multiple small bowel loops into the inguinal canal and scrotum on the left. Proximal small bowel dilatation is noted consistent with a degree of incarceration.  Bilateral small effusions as well as patchy bibasilar infiltrates.    12/14/2017 Adverse Reaction   Ibrutinib placed on hold   06/17/2020 Cancer Staging   Staging form: Chronic Lymphocytic Leukemia / Small Lymphocytic Lymphoma, AJCC 8th  Edition - Clinical stage from  06/17/2020: Modified Rai Stage 0 (Modified Rai risk: Low, Lymphocytosis: Present, Adenopathy: Absent, Organomegaly: Absent, Anemia: Absent, Thrombocytopenia: Absent) - Signed by Heath Lark, MD on 06/17/2020 Stage prefix: Initial diagnosis     REVIEW OF SYSTEMS:   Constitutional: Denies fevers, chills or abnormal weight loss Eyes: Denies blurriness of vision Ears, nose, mouth, throat, and face: Denies mucositis or sore throat Respiratory: Denies cough, dyspnea or wheezes Cardiovascular: Denies palpitation, chest discomfort Gastrointestinal:  Denies nausea, heartburn or change in bowel habits Skin: Denies abnormal skin rashes Lymphatics: Denies new lymphadenopathy or easy bruising Neurological:Denies numbness, tingling or new weaknesses Behavioral/Psych: Mood is stable, no new changes  All other systems were reviewed with the patient and are negative.  I have reviewed the past medical history, past surgical history, social history and family history with the patient and they are unchanged from previous note.  ALLERGIES:  has No Known Allergies.  MEDICATIONS:  Current Outpatient Medications  Medication Sig Dispense Refill  . acetaminophen (TYLENOL) 650 MG CR tablet Take 1 tablet (650 mg total) by mouth 2 (two) times daily as needed for pain. 30 tablet 0  . aspirin EC 81 MG tablet Take 81 mg by mouth daily.    . Capsaicin (ASPERCREME PAIN RELIEF PATCH EX) Apply 1 patch topically daily. Apply 1 patch to left shoulder 12 hrs/day. On at 8 am, off at 8 pm    . Cholecalciferol (VITAMIN D3) 1000 units CAPS Take 1,000 Units by mouth 2 (two) times daily.     . collagenase (SANTYL) ointment Apply 1 application topically daily.    . Cyanocobalamin (VITAMIN B 12 PO) Take 1,000 mcg by mouth daily.    Marland Kitchen gabapentin (NEURONTIN) 100 MG capsule Take 100 mg by mouth 3 (three) times daily.    Marland Kitchen ibrutinib (IMBRUVICA) 280 MG TABS TAKE 1 TABLET (280MG ) BY MOUTH DAILY. 28 tablet 11  . insulin NPH-regular Human  (70-30) 100 UNIT/ML injection Inject 15 Units into the skin 2 (two) times daily with a meal.    . mirtazapine (REMERON) 15 MG tablet Take 1 tablet (15 mg total) by mouth at bedtime.    . Multiple Vitamin (MULTIVITAMIN WITH MINERALS) TABS tablet Take 1 tablet by mouth daily.    . Nutritional Supplements (GLUCERNA 1.0 CAL) LIQD Take 30 Cans by mouth 2 (two) times daily.    . pantoprazole (PROTONIX) 40 MG tablet Take 40 mg by mouth daily.    Vladimir Faster Glycol-Propyl Glycol (SYSTANE OP) Place 1 drop into both eyes 2 (two) times daily.    . polyethylene glycol (MIRALAX / GLYCOLAX) packet Take 17 g by mouth daily. 14 each 0  . ramipril (ALTACE) 5 MG capsule Take 5 mg by mouth daily.    . tamsulosin (FLOMAX) 0.4 MG CAPS capsule Take 0.4 mg by mouth daily.    Marland Kitchen torsemide (DEMADEX) 20 MG tablet Take 20 mg by mouth daily.     No current facility-administered medications for this visit.    PHYSICAL EXAMINATION: ECOG PERFORMANCE STATUS: 2 - Symptomatic, <50% confined to bed  Vitals:   06/17/20 1040  BP: (!) 135/47  Pulse: (!) 59  Resp: 18  Temp: 98.1 F (36.7 C)  SpO2: 100%   Filed Weights   06/17/20 1040  Weight: 208 lb 12.8 oz (94.7 kg)    GENERAL:alert, no distress and comfortable SKIN: skin color, texture, turgor are normal, no rashes or significant lesions.  Noted skin bruises EYES: normal, Conjunctiva are pink and  non-injected, sclera clear OROPHARYNX:no exudate, no erythema and lips, buccal mucosa, and tongue normal  NECK: supple, thyroid normal size, non-tender, without nodularity LYMPH:  no palpable lymphadenopathy in the cervical, axillary or inguinal LUNGS: clear to auscultation and percussion with normal breathing effort HEART: regular rate & rhythm and no murmurs with chronic bilateral lower extremity edema ABDOMEN:abdomen soft, non-tender and normal bowel sounds Musculoskeletal:no cyanosis of digits and no clubbing  NEURO: alert & oriented x 3 with fluent speech, no focal  motor/sensory deficits  LABORATORY DATA:  I have reviewed the data as listed    Component Value Date/Time   NA 138 06/17/2020 1021   NA 140 10/09/2016 1106   K 4.2 06/17/2020 1021   K 4.7 10/09/2016 1106   CL 111 06/17/2020 1021   CL 104 03/28/2012 1426   CO2 21 (L) 06/17/2020 1021   CO2 28 10/09/2016 1106   GLUCOSE 118 (H) 06/17/2020 1021   GLUCOSE 127 10/09/2016 1106   GLUCOSE 171 (H) 03/28/2012 1426   BUN 31 (H) 06/17/2020 1021   BUN 20.8 10/09/2016 1106   CREATININE 1.27 (H) 06/17/2020 1021   CREATININE 1.15 09/06/2018 1016   CREATININE 1.1 10/09/2016 1106   CALCIUM 8.8 (L) 06/17/2020 1021   CALCIUM 9.1 10/09/2016 1106   PROT 6.3 (L) 06/17/2020 1021   PROT 5.8 (L) 10/09/2016 1106   ALBUMIN 3.3 (L) 06/17/2020 1021   ALBUMIN 3.1 (L) 10/09/2016 1106   AST 13 (L) 06/17/2020 1021   AST 18 09/06/2018 1016   AST 17 10/09/2016 1106   ALT 9 06/17/2020 1021   ALT 12 09/06/2018 1016   ALT 8 10/09/2016 1106   ALKPHOS 49 06/17/2020 1021   ALKPHOS 46 10/09/2016 1106   BILITOT 0.3 06/17/2020 1021   BILITOT 0.3 09/06/2018 1016   BILITOT 0.23 10/09/2016 1106   GFRNONAA 55 (L) 06/17/2020 1021   GFRNONAA 57 (L) 09/06/2018 1016   GFRAA 59 (L) 09/12/2019 1034   GFRAA >60 09/06/2018 1016    No results found for: SPEP, UPEP  Lab Results  Component Value Date   WBC 11.0 (H) 06/17/2020   NEUTROABS 5.5 06/17/2020   HGB 10.8 (L) 06/17/2020   HCT 33.8 (L) 06/17/2020   MCV 85.6 06/17/2020   PLT 237 06/17/2020      Chemistry      Component Value Date/Time   NA 138 06/17/2020 1021   NA 140 10/09/2016 1106   K 4.2 06/17/2020 1021   K 4.7 10/09/2016 1106   CL 111 06/17/2020 1021   CL 104 03/28/2012 1426   CO2 21 (L) 06/17/2020 1021   CO2 28 10/09/2016 1106   BUN 31 (H) 06/17/2020 1021   BUN 20.8 10/09/2016 1106   CREATININE 1.27 (H) 06/17/2020 1021   CREATININE 1.15 09/06/2018 1016   CREATININE 1.1 10/09/2016 1106      Component Value Date/Time   CALCIUM 8.8 (L)  06/17/2020 1021   CALCIUM 9.1 10/09/2016 1106   ALKPHOS 49 06/17/2020 1021   ALKPHOS 46 10/09/2016 1106   AST 13 (L) 06/17/2020 1021   AST 18 09/06/2018 1016   AST 17 10/09/2016 1106   ALT 9 06/17/2020 1021   ALT 12 09/06/2018 1016   ALT 8 10/09/2016 1106   BILITOT 0.3 06/17/2020 1021   BILITOT 0.3 09/06/2018 1016   BILITOT 0.23 10/09/2016 1106

## 2020-06-17 NOTE — Assessment & Plan Note (Signed)
He has chronic bilateral lower extremity edema He told me he is on a diuretic therapy

## 2020-06-17 NOTE — Telephone Encounter (Signed)
Scheduled appointment per 05/23 schedule message. Patient is aware. 

## 2020-06-17 NOTE — Assessment & Plan Note (Signed)
His renal function is stable We discussed the importance of risk factor modification

## 2020-06-17 NOTE — Assessment & Plan Note (Signed)
Anemia is stable It is likely related to chronic kidney disease and his underlying bone marrow disorder He is not symptomatic Observe only 

## 2020-06-17 NOTE — Assessment & Plan Note (Signed)
He tolerated treatment very well Lymphocytosis is stable His hemoglobin is stable We will continue treatment without dose adjustment I will see him back 3 months for follow-up I do not plan surveillance imaging studies unless he have signs or symptoms to suggest cancer progression

## 2020-07-01 ENCOUNTER — Other Ambulatory Visit (HOSPITAL_COMMUNITY): Payer: Self-pay

## 2020-07-03 ENCOUNTER — Other Ambulatory Visit (HOSPITAL_COMMUNITY): Payer: Self-pay

## 2020-07-10 DIAGNOSIS — R300 Dysuria: Secondary | ICD-10-CM | POA: Diagnosis not present

## 2020-07-10 DIAGNOSIS — N390 Urinary tract infection, site not specified: Secondary | ICD-10-CM | POA: Diagnosis not present

## 2020-07-10 DIAGNOSIS — R351 Nocturia: Secondary | ICD-10-CM | POA: Diagnosis not present

## 2020-07-25 ENCOUNTER — Encounter (INDEPENDENT_AMBULATORY_CARE_PROVIDER_SITE_OTHER): Payer: Self-pay | Admitting: Ophthalmology

## 2020-07-25 ENCOUNTER — Ambulatory Visit (INDEPENDENT_AMBULATORY_CARE_PROVIDER_SITE_OTHER): Payer: Medicare HMO | Admitting: Ophthalmology

## 2020-07-25 ENCOUNTER — Other Ambulatory Visit: Payer: Self-pay

## 2020-07-25 ENCOUNTER — Other Ambulatory Visit (HOSPITAL_COMMUNITY): Payer: Self-pay

## 2020-07-25 DIAGNOSIS — E113592 Type 2 diabetes mellitus with proliferative diabetic retinopathy without macular edema, left eye: Secondary | ICD-10-CM | POA: Diagnosis not present

## 2020-07-25 DIAGNOSIS — E113551 Type 2 diabetes mellitus with stable proliferative diabetic retinopathy, right eye: Secondary | ICD-10-CM | POA: Diagnosis not present

## 2020-07-25 NOTE — Progress Notes (Signed)
07/25/2020     CHIEF COMPLAINT Patient presents for Retina Follow Up (3 month fu os and prp os/fp/Pt states, "I cannot tell any real change in my vision. I do have some issues with my left eye feeling like it has trash in it."/Denies any FOL/Floaters/A1C:7.0/LBS:091)   HISTORY OF PRESENT ILLNESS: Michael [name] is a 87 y.o. male who presents to the clinic today for:   HPI     Retina Follow Up           Diagnosis: Diabetic Retinopathy   Laterality: left eye   Onset: 3 months ago   Severity: mild   Duration: 3 months   Course: stable   Comments: 3 month fu os and prp os/fp Pt states, "I cannot tell any real change in my vision. I do have some issues with my left eye feeling like it has trash in it." Denies any FOL/Floaters A1C:7.0 LBS:091       Last edited by Kendra Opitz, COA on 07/25/2020  9:40 AM.      Referring physician: Janie Morning, DO [address] [place],  [place] [id number]  HISTORICAL INFORMATION:   Selected notes from the MEDICAL RECORD NUMBER    Lab Results  Component Value Date   HGBA1C 7.0 (H) 11/04/2017     CURRENT MEDICATIONS: Current Outpatient Medications (Ophthalmic Drugs)  Medication Sig   Polyethyl Glycol-Propyl Glycol (SYSTANE OP) Place 1 drop into both eyes 2 (two) times daily.   No current facility-administered medications for this visit. (Ophthalmic Drugs)   Current Outpatient Medications (Other)  Medication Sig   acetaminophen (TYLENOL) 650 MG CR tablet Take 1 tablet (650 mg total) by mouth 2 (two) times daily as needed for pain.   aspirin EC 81 MG tablet Take 81 mg by mouth daily.   Capsaicin (ASPERCREME PAIN RELIEF PATCH EX) Apply 1 patch topically daily. Apply 1 patch to left shoulder 12 hrs/day. On at 8 am, off at 8 pm   Cholecalciferol (VITAMIN D3) 1000 units CAPS Take 1,000 Units by mouth 2 (two) times daily.    collagenase (SANTYL) ointment Apply 1 application topically daily.   Cyanocobalamin (VITAMIN B 12  PO) Take 1,000 mcg by mouth daily.   gabapentin (NEURONTIN) 100 MG capsule Take 100 mg by mouth 3 (three) times daily.   ibrutinib (IMBRUVICA) 280 MG TABS TAKE 1 TABLET (280MG ) BY MOUTH DAILY.   insulin NPH-regular Human (70-30) 100 UNIT/ML injection Inject 15 Units into the skin 2 (two) times daily with a meal.   mirtazapine (REMERON) 15 MG tablet Take 1 tablet (15 mg total) by mouth at bedtime.   Multiple Vitamin (MULTIVITAMIN WITH MINERALS) TABS tablet Take 1 tablet by mouth daily.   Nutritional Supplements (GLUCERNA 1.0 CAL) LIQD Take 30 Cans by mouth 2 (two) times daily.   pantoprazole (PROTONIX) 40 MG tablet Take 40 mg by mouth daily.   polyethylene glycol (MIRALAX / GLYCOLAX) packet Take 17 g by mouth daily.   ramipril (ALTACE) 5 MG capsule Take 5 mg by mouth daily.   tamsulosin (FLOMAX) 0.4 MG CAPS capsule Take 0.4 mg by mouth daily.   torsemide (DEMADEX) 20 MG tablet Take 20 mg by mouth daily.   No current facility-administered medications for this visit. (Other)      REVIEW OF SYSTEMS:    ALLERGIES No Known Allergies  PAST MEDICAL HISTORY Past Medical History:  Diagnosis Date   Anemia    Anginal pain ([place])    upon exertion  Arthritis    "left shoulder, lower back" (10/20/2016)   CLL (chronic lymphoblastic leukemia) dx'd 2000   Edema leg    Family history of adverse reaction to anesthesia    "daughter's BP drops"    GERD (gastroesophageal reflux disease)    History of hiatal hernia    Leukemia, chronic lymphoid ([place]) 12/08/2010   Lymphocytosis    Type II diabetes mellitus ([place])    Past Surgical History:  Procedure Laterality Date   BOWEL RESECTION N/A 11/15/2017   Procedure: SMALL BOWEL RESECTION;  Surgeon: Coralie Keens, MD;  Location: [facility];  Service: General;  Laterality: N/A;   CATARACT EXTRACTION W/ INTRAOCULAR LENS  IMPLANT, BILATERAL Bilateral    EYE SURGERY     Cataracts (bilateral)   GROIN DISSECTION Left 11/15/2017   Procedure: GROIN  EXPLORATION WITH REMOVAL OF INGUINAL HERNIA MESH;  Surgeon: Coralie Keens, MD;  Location: [facility];  Service: General;  Laterality: Left;   INGUINAL HERNIA REPAIR Left 11/10/2017   Procedure: LEFT INGUINAL HERNIA REPAIR ERAS PATHWAY;  Surgeon: Coralie Keens, MD;  Location: [facility];  Service: General;  Laterality: Left;   INGUINAL HERNIA REPAIR Left 11/15/2017   Procedure: LEFT INGUINAL HERNIA REPAIR WITH MESH;  Surgeon: Coralie Keens, MD;  Location: [facility];  Service: General;  Laterality: Left;   INSERTION OF MESH Left 11/10/2017   Procedure: INSERTION OF MESH;  Surgeon: Coralie Keens, MD;  Location: [facility];  Service: General;  Laterality: Left;    FAMILY HISTORY Family History  Problem Relation Age of Onset   Cancer Mother    Hypertension Father    Diabetes Father     SOCIAL HISTORY Social History   Tobacco Use   Smoking status: Never   Smokeless tobacco: Former    Types: Snuff    Quit date: 2017  Vaping Use   Vaping Use: Never used  Substance Use Topics   Alcohol use: Not Currently   Drug use: No         OPHTHALMIC EXAM:  Base Eye Exam     Visual Acuity (ETDRS)       Right Left   Dist cc 20/20 -2 20/80 -2   Dist ph cc  NI         Tonometry (Tonopen, 9:45 AM)       Right Left   Pressure 12 15         Pupils       Pupils Dark Light Shape React APD   Right PERRL 3 3 Round Minimal None   Left PERRL 3 3 Round Minimal None         Visual Fields (Counting fingers)       Left Right    Full Full         Extraocular Movement       Right Left    Full Full         Neuro/Psych     Oriented x3: Yes   Mood/Affect: Normal         Dilation     Left eye: 1.0% Mydriacyl, 2.5% Phenylephrine @ 9:45 AM           Slit Lamp and Fundus Exam     External Exam       Right Left   External Normal Normal         Slit Lamp Exam       Right Left   Lids/Lashes Normal Normal   Conjunctiva/Sclera White and quiet White and  quiet    Cornea Clear Clear   Anterior Chamber Deep and quiet Deep and quiet   Iris Round and reactive Round and reactive   Lens Centered posterior chamber intraocular lens Centered posterior chamber intraocular lens   Anterior Vitreous Normal Normal         Fundus Exam       Right Left   Posterior Vitreous Normal Posterior vitreous detachment   Disc Normal Normal   C/D Ratio 0.5 0.5   Macula Microaneurysms, no clinically significant macular edema Microaneurysms   Vessels PDR-quiet PDR-quiet   Periphery room temp for laser room sup for prp            IMAGING AND PROCEDURES  Imaging and Procedures for 07/25/20  Color Fundus Photography Optos - OU - Both Eyes       Right Eye Disc findings include normal observations. Macula : microaneurysms.   Left Eye Progression has been stable. Disc findings include normal observations. Macula : microaneurysms.   Notes Good PRP nasal to 70 degrees.  Open temporal window for more PRP OD in the future  OS, good PRP yet room temporally and superiorly and nasally for completion, vitreous debris of hemorrhage centrally OS       Panretinal Photocoagulation - OS - Left Eye       Time Out Confirmed correct patient, procedure, site, and patient consented.   Anesthesia Topical anesthesia was used. Anesthetic medications included Proparacaine 0.5%.   Laser Information The type of laser was diode. Color was yellow. The duration in seconds was 0.03. The spot size was 390 microns. Laser power was 260. Total spots was 466.   Post-op The patient tolerated the procedure well. There were no complications. The patient received written and verbal post procedure care education.   Notes PRP completed temporally anteriorly             ASSESSMENT/PLAN:  Proliferative diabetic retinopathy of left eye (HCC) PDR slightly active, room temporally for PRP completed today.     ICD-10-CM   1. Proliferative diabetic retinopathy of left eye  without macular edema associated with type 2 diabetes mellitus (HCC)  U93.2355 Color Fundus Photography Optos - OU - Both Eyes    Panretinal Photocoagulation - OS - Left Eye    2. Stable treated proliferative diabetic retinopathy of right eye determined by examination associated with type 2 diabetes mellitus ([place])  E11.3551       1.  2.  3.  Ophthalmic Meds Ordered this visit:  No orders of the defined types were placed in this encounter.      Return in about 3 months (around 10/25/2020) for DILATE OU, OCT, COLOR FP.  There are no Patient Instructions on file for this visit.   Explained the diagnoses, plan, and follow up with the patient and they expressed understanding.  Patient expressed understanding of the importance of proper follow up care.   Clent Demark Skyley [place]ma[place] M.D. Diseases & Surgery of the Retina and Vitreous [facility] 07/25/20     Abbreviations: M myopia (nearsighted); A astigmatism; H hyperopia (farsighted); P presbyopia; Mrx spectacle prescription;  CTL contact lenses; OD right eye; OS left eye; OU both eyes  XT exotropia; ET esotropia; PEK punctate epithelial keratitis; PEE punctate epithelial erosions; DES dry eye syndrome; MGD meibomian gland dysfunction; ATs artificial tears; PFAT's preservative free artificial tears; [place] nuclear sclerotic cataract; PSC posterior subcapsular cataract; ERM epi-retinal membrane; PVD posterior vitreous detachment; RD retinal detachment; DM diabetes mellitus;  DR diabetic retinopathy; NPDR non-proliferative diabetic retinopathy; PDR proliferative diabetic retinopathy; CSME clinically significant macular edema; DME diabetic macular edema; dbh dot blot hemorrhages; CWS cotton wool spot; POAG primary open angle glaucoma; C/D cup-to-disc ratio; HVF humphrey visual field; GVF goldmann visual field; OCT optical coherence tomography; IOP intraocular pressure; BRVO Branch retinal vein occlusion; CRVO central retinal vein occlusion;  CRAO central retinal artery occlusion; BRAO branch retinal artery occlusion; RT retinal tear; SB scleral buckle; PPV pars plana vitrectomy; VH Vitreous hemorrhage; PRP panretinal laser photocoagulation; IVK intravitreal kenalog; VMT vitreomacular traction; MH Macular hole;  NVD neovascularization of the disc; NVE neovascularization elsewhere; AREDS age related eye disease study; ARMD age related macular degeneration; POAG primary open angle glaucoma; EBMD epithelial/anterior basement membrane dystrophy; ACIOL anterior chamber intraocular lens; IOL intraocular lens; PCIOL posterior chamber intraocular lens; Phaco/IOL phacoemulsification with intraocular lens placement; [place] photorefractive keratectomy; LASIK laser assisted in situ keratomileusis; HTN hypertension; DM diabetes mellitus; COPD chronic obstructive pulmonary disease

## 2020-07-25 NOTE — Assessment & Plan Note (Signed)
PDR slightly active, room temporally for PRP completed today.

## 2020-07-26 ENCOUNTER — Other Ambulatory Visit (HOSPITAL_COMMUNITY): Payer: Self-pay

## 2020-07-30 ENCOUNTER — Other Ambulatory Visit (HOSPITAL_COMMUNITY): Payer: Self-pay

## 2020-07-31 ENCOUNTER — Other Ambulatory Visit (HOSPITAL_COMMUNITY): Payer: Self-pay

## 2020-07-31 DIAGNOSIS — M5416 Radiculopathy, lumbar region: Secondary | ICD-10-CM | POA: Diagnosis not present

## 2020-08-11 DIAGNOSIS — E119 Type 2 diabetes mellitus without complications: Secondary | ICD-10-CM | POA: Diagnosis not present

## 2020-08-13 ENCOUNTER — Other Ambulatory Visit: Payer: Self-pay

## 2020-08-13 ENCOUNTER — Ambulatory Visit (INDEPENDENT_AMBULATORY_CARE_PROVIDER_SITE_OTHER): Payer: Medicare HMO | Admitting: Ophthalmology

## 2020-08-13 DIAGNOSIS — E113592 Type 2 diabetes mellitus with proliferative diabetic retinopathy without macular edema, left eye: Secondary | ICD-10-CM

## 2020-08-13 DIAGNOSIS — E113551 Type 2 diabetes mellitus with stable proliferative diabetic retinopathy, right eye: Secondary | ICD-10-CM

## 2020-08-13 NOTE — Progress Notes (Signed)
08/13/2020     CHIEF COMPLAINT Patient presents for No chief complaint on file.   HISTORY OF PRESENT ILLNESS: Michael [name] is a 87 y.o. male who presents to the clinic today for:   HPI   Now 20 days post laser PRP OS for active PDR, with new onset floaters and cloudy vision concerns for the patient Last edited by Hurman Horn, MD on 08/13/2020  3:04 PM.      Referring physician: Janie Morning, DO [address] [place],  [place] [address]  HISTORICAL INFORMATION:   Selected notes from the MEDICAL RECORD NUMBER    Lab Results  Component Value Date   HGBA1C 7.0 (H) 11/04/2017     CURRENT MEDICATIONS: Current Outpatient Medications (Ophthalmic Drugs)  Medication Sig   Polyethyl Glycol-Propyl Glycol (SYSTANE OP) Place 1 drop into both eyes 2 (two) times daily.   No current facility-administered medications for this visit. (Ophthalmic Drugs)   Current Outpatient Medications (Other)  Medication Sig   acetaminophen (TYLENOL) 650 MG CR tablet Take 1 tablet (650 mg total) by mouth 2 (two) times daily as needed for pain.   aspirin EC 81 MG tablet Take 81 mg by mouth daily.   Capsaicin (ASPERCREME PAIN RELIEF PATCH EX) Apply 1 patch topically daily. Apply 1 patch to left shoulder 12 hrs/day. On at 8 am, off at 8 pm   Cholecalciferol (VITAMIN D3) 1000 units CAPS Take 1,000 Units by mouth 2 (two) times daily.    collagenase (SANTYL) ointment Apply 1 application topically daily.   Cyanocobalamin (VITAMIN B 12 PO) Take 1,000 mcg by mouth daily.   gabapentin (NEURONTIN) 100 MG capsule Take 100 mg by mouth 3 (three) times daily.   ibrutinib (IMBRUVICA) 280 MG TABS TAKE 1 TABLET (280MG ) BY MOUTH DAILY.   insulin NPH-regular Human (70-30) 100 UNIT/ML injection Inject 15 Units into the skin 2 (two) times daily with a meal.   mirtazapine (REMERON) 15 MG tablet Take 1 tablet (15 mg total) by mouth at bedtime.   Multiple Vitamin (MULTIVITAMIN WITH MINERALS) TABS tablet Take  1 tablet by mouth daily.   Nutritional Supplements (GLUCERNA 1.0 CAL) LIQD Take 30 Cans by mouth 2 (two) times daily.   pantoprazole (PROTONIX) 40 MG tablet Take 40 mg by mouth daily.   polyethylene glycol (MIRALAX / GLYCOLAX) packet Take 17 g by mouth daily.   ramipril (ALTACE) 5 MG capsule Take 5 mg by mouth daily.   tamsulosin (FLOMAX) 0.4 MG CAPS capsule Take 0.4 mg by mouth daily.   torsemide (DEMADEX) 20 MG tablet Take 20 mg by mouth daily.   No current facility-administered medications for this visit. (Other)      REVIEW OF SYSTEMS:    ALLERGIES No Known Allergies  PAST MEDICAL HISTORY Past Medical History:  Diagnosis Date   Anemia    Anginal pain ([place])    upon exertion   Arthritis    "left shoulder, lower back" (10/20/2016)   CLL (chronic lymphoblastic leukemia) dx'd 2000   Edema leg    Family history of adverse reaction to anesthesia    "daughter's BP drops"    GERD (gastroesophageal reflux disease)    History of hiatal hernia    Leukemia, chronic lymphoid ([place]) 12/08/2010   Lymphocytosis    Type II diabetes mellitus ([place])    Past Surgical History:  Procedure Laterality Date   BOWEL RESECTION N/A 11/15/2017   Procedure: SMALL BOWEL RESECTION;  Surgeon: Coralie Keens, MD;  Location: [facility];  Service: General;  Laterality: N/A;   CATARACT EXTRACTION W/ INTRAOCULAR LENS  IMPLANT, BILATERAL Bilateral    EYE SURGERY     Cataracts (bilateral)   GROIN DISSECTION Left 11/15/2017   Procedure: GROIN EXPLORATION WITH REMOVAL OF INGUINAL HERNIA MESH;  Surgeon: Coralie Keens, MD;  Location: [facility];  Service: General;  Laterality: Left;   INGUINAL HERNIA REPAIR Left 11/10/2017   Procedure: LEFT INGUINAL HERNIA REPAIR ERAS PATHWAY;  Surgeon: Coralie Keens, MD;  Location: [facility];  Service: General;  Laterality: Left;   INGUINAL HERNIA REPAIR Left 11/15/2017   Procedure: LEFT INGUINAL HERNIA REPAIR WITH MESH;  Surgeon: Coralie Keens, MD;  Location: [facility];   Service: General;  Laterality: Left;   INSERTION OF MESH Left 11/10/2017   Procedure: INSERTION OF MESH;  Surgeon: Coralie Keens, MD;  Location: [facility];  Service: General;  Laterality: Left;    FAMILY HISTORY Family History  Problem Relation Age of Onset   Cancer Mother    Hypertension Father    Diabetes Father     SOCIAL HISTORY Social History   Tobacco Use   Smoking status: Never   Smokeless tobacco: Former    Types: Snuff    Quit date: 2017  Vaping Use   Vaping Use: Never used  Substance Use Topics   Alcohol use: Not Currently   Drug use: No         OPHTHALMIC EXAM:  Base Eye Exam     Visual Acuity (ETDRS)       Right Left   Dist cc 20/20 20/150   Dist ph cc  20/100         Tonometry (Tonopen, 3:03 PM)       Right Left   Pressure 17 16         Pupils       Pupils React APD   Right PERRL Brisk None   Left PERRL Brisk None         Visual Fields       Left Right    Full Full         Extraocular Movement       Right Left    Full Full         Neuro/Psych     Oriented x3: Yes   Mood/Affect: Normal         Dilation     Both eyes: 1.0% Mydriacyl, 2.5% Phenylephrine @ 3:01 PM           Slit Lamp and Fundus Exam     External Exam       Right Left   External Normal Normal         Slit Lamp Exam       Right Left   Lids/Lashes Normal Normal   Conjunctiva/Sclera White and quiet White and quiet   Cornea Clear Clear   Anterior Chamber Deep and quiet Deep and quiet   Iris Round and reactive Round and reactive   Lens Centered posterior chamber intraocular lens Centered posterior chamber intraocular lens   Anterior Vitreous Normal Normal         Fundus Exam       Right Left   Posterior Vitreous Normal Posterior vitreous detachment, no change , no heme   Disc Normal Normal   C/D Ratio 0.5 0.5   Macula Microaneurysms, no clinically significant macular edema Microaneurysms   Vessels PDR-quiet PDR-quiet    Periphery room temp for laser room sup for prp  IMAGING AND PROCEDURES  Imaging and Procedures for 08/13/20  Color Fundus Photography Optos - OU - Both Eyes       Right Eye Disc findings include normal observations. Macula : microaneurysms.   Left Eye Progression has been stable. Disc findings include normal observations. Macula : microaneurysms.   Notes Good PRP nasal to 70 degrees.  Open temporal window for more PRP OD in the future  OS, good PRP  316 including now temporally , clearing vitreous debris OS               ASSESSMENT/PLAN:  Proliferative diabetic retinopathy of left eye (HCC) PRP completed OS temporally 20 days prior  Stable treated proliferative diabetic retinopathy of right eye determined by examination associated with type 2 diabetes mellitus ([place]) OD, temporal window open for completion of PRP 1 day in the future stable for now     ICD-10-CM   1. Proliferative diabetic retinopathy of left eye without macular edema associated with type 2 diabetes mellitus (HCC)  B76.2831 Color Fundus Photography Optos - OU - Both Eyes    2. Stable treated proliferative diabetic retinopathy of right eye determined by examination associated with type 2 diabetes mellitus ([place] [place])  D17.6160       1.  OS with minor vitreous debris, clearing.  Good PRP temporally to complete the PRP and prevent PDR progression OS  2.  OD also with quiet PDR but room temporally should PE DR reactivate we will continue to observe  3.  Ophthalmic Meds Ordered this visit:  No orders of the defined types were placed in this encounter.      Return in about 3 months (around 11/13/2020) for dilate, OCT, COLOR FP.  There are no Patient Instructions on file for this visit.   Explained the diagnoses, plan, and follow up with the patient and they expressed understanding.  Patient expressed understanding of the importance of proper follow up care.   Clent Demark Channin Agustin  M.D. Diseases & Surgery of the Retina and Vitreous [facility]abetic [facility] 08/13/20     Abbreviations: M myopia (nearsighted); A astigmatism; H hyperopia (farsighted); P presbyopia; Mrx spectacle prescription;  CTL contact lenses; OD right eye; OS left eye; OU both eyes  XT exotropia; ET esotropia; PEK punctate epithelial keratitis; PEE punctate epithelial erosions; DES dry eye syndrome; MGD meibomian gland dysfunction; ATs artificial tears; PFAT's preservative free artificial tears; [place] nuclear sclerotic cataract; PSC posterior subcapsular cataract; ERM epi-retinal membrane; PVD posterior vitreous detachment; RD retinal detachment; DM diabetes mellitus; DR diabetic retinopathy; NPDR non-proliferative diabetic retinopathy; PDR proliferative diabetic retinopathy; CSME clinically significant macular edema; DME diabetic macular edema; dbh dot blot hemorrhages; CWS cotton wool spot; POAG primary open angle glaucoma; C/D cup-to-disc ratio; HVF humphrey visual field; GVF goldmann visual field; OCT optical coherence tomography; IOP intraocular pressure; BRVO Branch retinal vein occlusion; CRVO central retinal vein occlusion; CRAO central retinal artery occlusion; BRAO branch retinal artery occlusion; RT retinal tear; SB scleral buckle; PPV pars plana vitrectomy; VH Vitreous hemorrhage; PRP panretinal laser photocoagulation; IVK intravitreal kenalog; VMT vitreomacular traction; MH Macular hole;  NVD neovascularization of the disc; NVE neovascularization elsewhere; AREDS age related eye disease study; ARMD age related macular degeneration; POAG primary open angle glaucoma; EBMD epithelial/anterior basement membrane dystrophy; ACIOL anterior chamber intraocular lens; IOL intraocular lens; PCIOL posterior chamber intraocular lens; Phaco/IOL phacoemulsification with intraocular lens placement; [place] photorefractive keratectomy; LASIK laser assisted in situ keratomileusis; HTN hypertension; DM diabetes mellitus; COPD  chronic obstructive pulmonary disease

## 2020-08-13 NOTE — Assessment & Plan Note (Signed)
PRP completed OS temporally 20 days prior

## 2020-08-13 NOTE — Assessment & Plan Note (Signed)
OD, temporal window open for completion of PRP 1 day in the future stable for now

## 2020-08-22 ENCOUNTER — Other Ambulatory Visit (HOSPITAL_COMMUNITY): Payer: Self-pay

## 2020-08-29 ENCOUNTER — Other Ambulatory Visit (HOSPITAL_COMMUNITY): Payer: Self-pay

## 2020-09-02 DIAGNOSIS — R7989 Other specified abnormal findings of blood chemistry: Secondary | ICD-10-CM | POA: Diagnosis not present

## 2020-09-02 DIAGNOSIS — E785 Hyperlipidemia, unspecified: Secondary | ICD-10-CM | POA: Diagnosis not present

## 2020-09-02 DIAGNOSIS — E1122 Type 2 diabetes mellitus with diabetic chronic kidney disease: Secondary | ICD-10-CM | POA: Diagnosis not present

## 2020-09-02 DIAGNOSIS — I10 Essential (primary) hypertension: Secondary | ICD-10-CM | POA: Diagnosis not present

## 2020-09-02 DIAGNOSIS — Z79899 Other long term (current) drug therapy: Secondary | ICD-10-CM | POA: Diagnosis not present

## 2020-09-09 DIAGNOSIS — E7800 Pure hypercholesterolemia, unspecified: Secondary | ICD-10-CM | POA: Diagnosis not present

## 2020-09-09 DIAGNOSIS — C9190 Lymphoid leukemia, unspecified not having achieved remission: Secondary | ICD-10-CM | POA: Diagnosis not present

## 2020-09-09 DIAGNOSIS — K219 Gastro-esophageal reflux disease without esophagitis: Secondary | ICD-10-CM | POA: Diagnosis not present

## 2020-09-09 DIAGNOSIS — N182 Chronic kidney disease, stage 2 (mild): Secondary | ICD-10-CM | POA: Diagnosis not present

## 2020-09-09 DIAGNOSIS — E1142 Type 2 diabetes mellitus with diabetic polyneuropathy: Secondary | ICD-10-CM | POA: Diagnosis not present

## 2020-09-09 DIAGNOSIS — I739 Peripheral vascular disease, unspecified: Secondary | ICD-10-CM | POA: Diagnosis not present

## 2020-09-09 DIAGNOSIS — I10 Essential (primary) hypertension: Secondary | ICD-10-CM | POA: Diagnosis not present

## 2020-09-09 DIAGNOSIS — R609 Edema, unspecified: Secondary | ICD-10-CM | POA: Diagnosis not present

## 2020-09-09 DIAGNOSIS — N401 Enlarged prostate with lower urinary tract symptoms: Secondary | ICD-10-CM | POA: Diagnosis not present

## 2020-09-09 DIAGNOSIS — D509 Iron deficiency anemia, unspecified: Secondary | ICD-10-CM | POA: Diagnosis not present

## 2020-09-16 ENCOUNTER — Other Ambulatory Visit: Payer: Self-pay

## 2020-09-16 ENCOUNTER — Inpatient Hospital Stay: Payer: Medicare HMO | Admitting: Hematology and Oncology

## 2020-09-16 ENCOUNTER — Encounter: Payer: Self-pay | Admitting: Hematology and Oncology

## 2020-09-16 ENCOUNTER — Inpatient Hospital Stay: Payer: Medicare HMO | Attending: Hematology and Oncology

## 2020-09-16 DIAGNOSIS — D630 Anemia in neoplastic disease: Secondary | ICD-10-CM | POA: Diagnosis not present

## 2020-09-16 DIAGNOSIS — R600 Localized edema: Secondary | ICD-10-CM | POA: Diagnosis not present

## 2020-09-16 DIAGNOSIS — N183 Chronic kidney disease, stage 3 unspecified: Secondary | ICD-10-CM | POA: Insufficient documentation

## 2020-09-16 DIAGNOSIS — C9110 Chronic lymphocytic leukemia of B-cell type not having achieved remission: Secondary | ICD-10-CM

## 2020-09-16 LAB — COMPREHENSIVE METABOLIC PANEL
ALT: 8 U/L (ref 0–44)
AST: 14 U/L — ABNORMAL LOW (ref 15–41)
Albumin: 3.3 g/dL — ABNORMAL LOW (ref 3.5–5.0)
Alkaline Phosphatase: 49 U/L (ref 38–126)
Anion gap: 9 (ref 5–15)
BUN: 20 mg/dL (ref 8–23)
CO2: 24 mmol/L (ref 22–32)
Calcium: 8.6 mg/dL — ABNORMAL LOW (ref 8.9–10.3)
Chloride: 108 mmol/L (ref 98–111)
Creatinine, Ser: 1.36 mg/dL — ABNORMAL HIGH (ref 0.61–1.24)
GFR, Estimated: 50 mL/min — ABNORMAL LOW (ref >60)
Glucose, Bld: 117 mg/dL — ABNORMAL HIGH (ref 70–99)
Potassium: 4.4 mmol/L (ref 3.5–5.1)
Sodium: 141 mmol/L (ref 135–145)
Total Bilirubin: 0.4 mg/dL (ref 0.3–1.2)
Total Protein: 6.1 g/dL — ABNORMAL LOW (ref 6.5–8.1)

## 2020-09-16 LAB — CBC WITH DIFFERENTIAL/PLATELET
Abs Immature Granulocytes: 0.03 10*3/uL (ref 0.00–0.07)
Basophils Absolute: 0.1 10*3/uL (ref 0.0–0.1)
Basophils Relative: 1 %
Eosinophils Absolute: 0.1 10*3/uL (ref 0.0–0.5)
Eosinophils Relative: 1 %
HCT: 33 % — ABNORMAL LOW (ref 39.0–52.0)
Hemoglobin: 11 g/dL — ABNORMAL LOW (ref 13.0–17.0)
Immature Granulocytes: 0 %
Lymphocytes Relative: 54 %
Lymphs Abs: 4.4 10*3/uL — ABNORMAL HIGH (ref 0.7–4.0)
MCH: 29.4 pg (ref 26.0–34.0)
MCHC: 33.3 g/dL (ref 30.0–36.0)
MCV: 88.2 fL (ref 80.0–100.0)
Monocytes Absolute: 0.4 10*3/uL (ref 0.1–1.0)
Monocytes Relative: 5 %
Neutro Abs: 3.3 10*3/uL (ref 1.7–7.7)
Neutrophils Relative %: 39 %
Platelets: 203 10*3/uL (ref 150–400)
RBC: 3.74 MIL/uL — ABNORMAL LOW (ref 4.22–5.81)
RDW: 15.1 % (ref 11.5–15.5)
WBC: 8.3 10*3/uL (ref 4.0–10.5)
nRBC: 0 % (ref 0.0–0.2)

## 2020-09-16 NOTE — Assessment & Plan Note (Signed)
He tolerated treatment very well Lymphocytosis is stable His hemoglobin is stable We will continue treatment without dose adjustment I will see him back 3 months for follow-up I do not plan surveillance imaging studies unless he have signs or symptoms to suggest cancer progression

## 2020-09-16 NOTE — Assessment & Plan Note (Signed)
His renal function is stable We discussed the importance of risk factor modification

## 2020-09-16 NOTE — Assessment & Plan Note (Signed)
He has chronic bilateral lower extremity edema He told me he is on a diuretic therapy

## 2020-09-16 NOTE — Progress Notes (Signed)
[facility] OFFICE PROGRESS NOTE  Patient Care Team: Janie Morning, DO as PCP - General (Family Medicine) Heath Lark, MD as Consulting Physician (Hematology and Oncology)  ASSESSMENT & PLAN:  CLL (chronic lymphocytic leukemia) ([place]) He tolerated treatment very well Lymphocytosis is stable His hemoglobin is stable We will continue treatment without dose adjustment I will see him back 3 months for follow-up I do not plan surveillance imaging studies unless he have signs or symptoms to suggest cancer progression  Chronic kidney disease, stage III (moderate) His renal function is stable We discussed the importance of risk factor modification  Anemia in neoplastic disease Anemia is stable It is likely related to chronic kidney disease and his underlying bone marrow disorder He is not symptomatic Observe only  Bilateral leg edema He has chronic bilateral lower extremity edema He told me he is on a diuretic therapy  No orders of the defined types were placed in this encounter.   All questions were answered. The patient knows to call the clinic with any problems, questions or concerns. The total time spent in the appointment was 20 minutes encounter with patients including review of chart and various tests results, discussions about plan of care and coordination of care plan   Heath Lark, MD 09/16/2020 12:48 PM  INTERVAL HISTORY: Please see below for problem oriented charting. He returns for CLL follow-up He is doing well he has chronic bilateral lower extremity edema, stable Denies abnormal palpitation.  No excessive bruises He denies problems getting Imbruvica refill and has been compliant taking it  SUMMARY OF ONCOLOGIC HISTORY: Oncology History  CLL (chronic lymphocytic leukemia) ([place])  04/03/2008 Pathology Results   Case #: [id number] FLow cytomtry confirmed CLL   10/11/2014 Tumor Marker   FISH analysis showed trisomy 12 abnormality   04/29/2015 -   Chemotherapy   He started taking Ibrutinib   04/29/2015 Imaging   Ct scan showed mild lymphadenopathy and splenomegaly   05/13/2015 Adverse Reaction   Due to neutropenia, dose of Ibrutinib is reduced to 280 mg daily   01/08/2016 Imaging   Response to therapy. Resolution of thoracic and abdominal adenopathy. Resolution of splenomegaly. 2. No new or progressive disease. 3. Small bowel containing left inguinal hernia and right-sided hydrocele, as before. 4. Esophageal air fluid level suggests dysmotility or gastroesophageal reflux. 5.  Possible constipation. 6. Suspect hepatic hemangiomas, similar.   10/09/2016 Imaging   No findings suspicious for active lymphomatous involvement.  No abdominopelvic lymphadenopathy.  Spleen is normal in size.  Moderate to large left inguinal/scrotal hernia containing small bowel and fat. No evidence of bowel obstruction.  Additional stable ancillary findings as above.   04/26/2017 Imaging   1. No lymphadenopathy in the chest, abdomen, or pelvis. 2. Mild circumferential bladder wall thickening. Incomplete distention may contribute to this appearance, but bladder infection is also consideration. 3. Stable appearance small hepatic lesions. Imaging features most suggestive of benign cavernous hemangiomas. 4. Stable tiny low-density renal lesions compatible with cysts. 5. Large left groin hernia contains numerous small bowel loops without complicating features. 6. Right-sided hydrocele.   11/15/2017 Imaging   Large recurrent left inguinal hernia with extension of multiple small bowel loops into the inguinal canal and scrotum on the left. Proximal small bowel dilatation is noted consistent with a degree of incarceration.   Bilateral small effusions as well as patchy bibasilar infiltrates.     12/14/2017 Adverse Reaction   Ibrutinib placed on hold   06/17/2020 Cancer Staging   Staging form: Chronic  Lymphocytic Leukemia / Small Lymphocytic Lymphoma, AJCC 8th  Edition - Clinical stage from 06/17/2020: Modified Rai Stage 0 (Modified Rai risk: Low, Lymphocytosis: Present, Adenopathy: Absent, Organomegaly: Absent, Anemia: Absent, Thrombocytopenia: Absent) - Signed by Heath Lark, MD on 06/17/2020 Stage prefix: Initial diagnosis     REVIEW OF SYSTEMS:   Constitutional: Denies fevers, chills or abnormal weight loss Eyes: Denies blurriness of vision Ears, nose, mouth, throat, and face: Denies mucositis or sore throat Respiratory: Denies cough, dyspnea or wheezes Cardiovascular: Denies palpitation, chest discomfort or lower extremity swelling Gastrointestinal:  Denies nausea, heartburn or change in bowel habits Skin: Denies abnormal skin rashes Lymphatics: Denies new lymphadenopathy or easy bruising Neurological:Denies numbness, tingling or new weaknesses Behavioral/Psych: Mood is stable, no new changes  All other systems were reviewed with the patient and are negative.  I have reviewed the past medical history, past surgical history, social history and family history with the patient and they are unchanged from previous note.  ALLERGIES:  has No Known Allergies.  MEDICATIONS:  Current Outpatient Medications  Medication Sig Dispense Refill   insulin NPH-regular Human (70-30) 100 UNIT/ML injection Inject 35 Units into the skin 2 (two) times daily.     acetaminophen (TYLENOL) 650 MG CR tablet Take 1 tablet (650 mg total) by mouth 2 (two) times daily as needed for pain. 30 tablet 0   aspirin EC 81 MG tablet Take 81 mg by mouth daily.     Cholecalciferol (VITAMIN D3) 1000 units CAPS Take 1,000 Units by mouth 2 (two) times daily.      Cyanocobalamin (VITAMIN B 12 PO) Take 1,000 mcg by mouth daily.     ibrutinib (IMBRUVICA) 280 MG TABS TAKE 1 TABLET ('280MG'$ ) BY MOUTH DAILY. 28 tablet 11   mirtazapine (REMERON) 15 MG tablet Take 1 tablet (15 mg total) by mouth at bedtime.     Multiple Vitamin (MULTIVITAMIN WITH MINERALS) TABS tablet Take 1 tablet by mouth  daily.     pantoprazole (PROTONIX) 40 MG tablet Take 40 mg by mouth daily.     Polyethyl Glycol-Propyl Glycol (SYSTANE OP) Place 1 drop into both eyes 2 (two) times daily.     polyethylene glycol (MIRALAX / GLYCOLAX) packet Take 17 g by mouth daily. 14 each 0   ramipril (ALTACE) 5 MG capsule Take 5 mg by mouth daily.     tamsulosin (FLOMAX) 0.4 MG CAPS capsule Take 0.4 mg by mouth daily.     torsemide (DEMADEX) 20 MG tablet Take 20 mg by mouth daily.     No current facility-administered medications for this visit.    PHYSICAL EXAMINATION: ECOG PERFORMANCE STATUS: 1 - Symptomatic but completely ambulatory  Vitals:   09/16/20 1037  BP: 138/84  Pulse: 75  Resp: 18  Temp: 98.5 F (36.9 C)  SpO2: 100%   Filed Weights   09/16/20 1037  Weight: 212 lb 3.2 oz (96.3 kg)    GENERAL:alert, no distress and comfortable SKIN: skin color, texture, turgor are normal, no rashes or significant lesions EYES: normal, Conjunctiva are pink and non-injected, sclera clear OROPHARYNX:no exudate, no erythema and lips, buccal mucosa, and tongue normal  NECK: supple, thyroid normal size, non-tender, without nodularity LYMPH:  no palpable lymphadenopathy in the cervical, axillary or inguinal LUNGS: clear to auscultation and percussion with normal breathing effort HEART: regular rate & rhythm and no murmurs with moderate bilateral lower extremity edema ABDOMEN:abdomen soft, non-tender and normal bowel sounds Musculoskeletal:no cyanosis of digits and no clubbing  NEURO: alert & oriented x  3 with fluent speech, no focal motor/sensory deficits  LABORATORY DATA:  I have reviewed the data as listed    Component Value Date/Time   NA 141 09/16/2020 1018   NA 140 10/09/2016 1106   K 4.4 09/16/2020 1018   K 4.7 10/09/2016 1106   CL 108 09/16/2020 1018   CL 104 03/28/2012 1426   CO2 24 09/16/2020 1018   CO2 28 10/09/2016 1106   GLUCOSE 117 (H) 09/16/2020 1018   GLUCOSE 127 10/09/2016 1106   GLUCOSE 171  (H) 03/28/2012 1426   BUN 20 09/16/2020 1018   BUN 20.8 10/09/2016 1106   CREATININE 1.36 (H) 09/16/2020 1018   CREATININE 1.15 09/06/2018 1016   CREATININE 1.1 10/09/2016 1106   CALCIUM 8.6 (L) 09/16/2020 1018   CALCIUM 9.1 10/09/2016 1106   PROT 6.1 (L) 09/16/2020 1018   PROT 5.8 (L) 10/09/2016 1106   ALBUMIN 3.3 (L) 09/16/2020 1018   ALBUMIN 3.1 (L) 10/09/2016 1106   AST 14 (L) 09/16/2020 1018   AST 18 09/06/2018 1016   AST 17 10/09/2016 1106   ALT 8 09/16/2020 1018   ALT 12 09/06/2018 1016   ALT 8 10/09/2016 1106   ALKPHOS 49 09/16/2020 1018   ALKPHOS 46 10/09/2016 1106   BILITOT 0.4 09/16/2020 1018   BILITOT 0.3 09/06/2018 1016   BILITOT 0.23 10/09/2016 1106   GFRNONAA 50 (L) 09/16/2020 1018   GFRNONAA 57 (L) 09/06/2018 1016   GFRAA 59 (L) 09/12/2019 1034   GFRAA >60 09/06/2018 1016    No results found for: SPEP, UPEP  Lab Results  Component Value Date   WBC 8.3 09/16/2020   NEUTROABS 3.3 09/16/2020   HGB 11.0 (L) 09/16/2020   HCT 33.0 (L) 09/16/2020   MCV 88.2 09/16/2020   PLT 203 09/16/2020      Chemistry      Component Value Date/Time   NA 141 09/16/2020 1018   NA 140 10/09/2016 1106   K 4.4 09/16/2020 1018   K 4.7 10/09/2016 1106   CL 108 09/16/2020 1018   CL 104 03/28/2012 1426   CO2 24 09/16/2020 1018   CO2 28 10/09/2016 1106   BUN 20 09/16/2020 1018   BUN 20.8 10/09/2016 1106   CREATININE 1.36 (H) 09/16/2020 1018   CREATININE 1.15 09/06/2018 1016   CREATININE 1.1 10/09/2016 1106      Component Value Date/Time   CALCIUM 8.6 (L) 09/16/2020 1018   CALCIUM 9.1 10/09/2016 1106   ALKPHOS 49 09/16/2020 1018   ALKPHOS 46 10/09/2016 1106   AST 14 (L) 09/16/2020 1018   AST 18 09/06/2018 1016   AST 17 10/09/2016 1106   ALT 8 09/16/2020 1018   ALT 12 09/06/2018 1016   ALT 8 10/09/2016 1106   BILITOT 0.4 09/16/2020 1018   BILITOT 0.3 09/06/2018 1016   BILITOT 0.23 10/09/2016 1106

## 2020-09-16 NOTE — Assessment & Plan Note (Signed)
Anemia is stable It is likely related to chronic kidney disease and his underlying bone marrow disorder He is not symptomatic Observe only 

## 2020-09-24 ENCOUNTER — Other Ambulatory Visit (HOSPITAL_COMMUNITY): Payer: Self-pay

## 2020-09-25 ENCOUNTER — Other Ambulatory Visit (HOSPITAL_COMMUNITY): Payer: Self-pay

## 2020-10-03 DIAGNOSIS — H31093 Other chorioretinal scars, bilateral: Secondary | ICD-10-CM | POA: Diagnosis not present

## 2020-10-03 DIAGNOSIS — Z961 Presence of intraocular lens: Secondary | ICD-10-CM | POA: Diagnosis not present

## 2020-10-03 DIAGNOSIS — H524 Presbyopia: Secondary | ICD-10-CM | POA: Diagnosis not present

## 2020-10-03 DIAGNOSIS — H18323 Folds in Descemet's membrane, bilateral: Secondary | ICD-10-CM | POA: Diagnosis not present

## 2020-10-03 DIAGNOSIS — Z794 Long term (current) use of insulin: Secondary | ICD-10-CM | POA: Diagnosis not present

## 2020-10-03 DIAGNOSIS — Z0100 Encounter for examination of eyes and vision without abnormal findings: Secondary | ICD-10-CM | POA: Diagnosis not present

## 2020-10-03 DIAGNOSIS — E113553 Type 2 diabetes mellitus with stable proliferative diabetic retinopathy, bilateral: Secondary | ICD-10-CM | POA: Diagnosis not present

## 2020-10-11 DIAGNOSIS — E119 Type 2 diabetes mellitus without complications: Secondary | ICD-10-CM | POA: Diagnosis not present

## 2020-10-16 ENCOUNTER — Other Ambulatory Visit (HOSPITAL_COMMUNITY): Payer: Self-pay

## 2020-10-22 ENCOUNTER — Other Ambulatory Visit (HOSPITAL_COMMUNITY): Payer: Self-pay

## 2020-10-24 ENCOUNTER — Other Ambulatory Visit (HOSPITAL_COMMUNITY): Payer: Self-pay

## 2020-10-25 DIAGNOSIS — I129 Hypertensive chronic kidney disease with stage 1 through stage 4 chronic kidney disease, or unspecified chronic kidney disease: Secondary | ICD-10-CM | POA: Diagnosis not present

## 2020-10-25 DIAGNOSIS — E1122 Type 2 diabetes mellitus with diabetic chronic kidney disease: Secondary | ICD-10-CM | POA: Diagnosis not present

## 2020-10-25 DIAGNOSIS — E785 Hyperlipidemia, unspecified: Secondary | ICD-10-CM | POA: Diagnosis not present

## 2020-10-31 ENCOUNTER — Encounter (INDEPENDENT_AMBULATORY_CARE_PROVIDER_SITE_OTHER): Payer: Medicare HMO | Admitting: Ophthalmology

## 2020-11-04 DIAGNOSIS — I890 Lymphedema, not elsewhere classified: Secondary | ICD-10-CM | POA: Diagnosis not present

## 2020-11-12 DIAGNOSIS — I890 Lymphedema, not elsewhere classified: Secondary | ICD-10-CM | POA: Diagnosis not present

## 2020-11-12 DIAGNOSIS — R7989 Other specified abnormal findings of blood chemistry: Secondary | ICD-10-CM | POA: Diagnosis not present

## 2020-11-12 DIAGNOSIS — E1122 Type 2 diabetes mellitus with diabetic chronic kidney disease: Secondary | ICD-10-CM | POA: Diagnosis not present

## 2020-11-12 DIAGNOSIS — E785 Hyperlipidemia, unspecified: Secondary | ICD-10-CM | POA: Diagnosis not present

## 2020-11-12 DIAGNOSIS — I10 Essential (primary) hypertension: Secondary | ICD-10-CM | POA: Diagnosis not present

## 2020-11-12 DIAGNOSIS — Z79899 Other long term (current) drug therapy: Secondary | ICD-10-CM | POA: Diagnosis not present

## 2020-11-14 ENCOUNTER — Other Ambulatory Visit: Payer: Self-pay

## 2020-11-14 ENCOUNTER — Ambulatory Visit (INDEPENDENT_AMBULATORY_CARE_PROVIDER_SITE_OTHER): Payer: Medicare HMO | Admitting: Ophthalmology

## 2020-11-14 ENCOUNTER — Encounter (INDEPENDENT_AMBULATORY_CARE_PROVIDER_SITE_OTHER): Payer: Self-pay | Admitting: Ophthalmology

## 2020-11-14 DIAGNOSIS — E113592 Type 2 diabetes mellitus with proliferative diabetic retinopathy without macular edema, left eye: Secondary | ICD-10-CM

## 2020-11-14 DIAGNOSIS — E113551 Type 2 diabetes mellitus with stable proliferative diabetic retinopathy, right eye: Secondary | ICD-10-CM

## 2020-11-14 NOTE — Assessment & Plan Note (Signed)
OD, quiescent PDR good PRP no active disease will observe

## 2020-11-14 NOTE — Assessment & Plan Note (Signed)
OS, stable overall no active disease

## 2020-11-14 NOTE — Progress Notes (Signed)
11/14/2020     CHIEF COMPLAINT Patient presents for  Chief Complaint  Patient presents with   Retina Follow Up    3 month fu os and prp os/fp Pt states, "I cannot tell any real change in my vision. I do have some issues with my left eye feeling like it has trash in it." Denies any FOL/Floaters A1C:7.0 LBS:091      HISTORY OF PRESENT ILLNESS: Michael [name] is a 87 y.o. male who presents to the clinic today for:   HPI     Retina Follow Up   Patient presents with  Diabetic Retinopathy.  In left eye.  This started 3 months ago.  Severity is mild.  Duration of 3 months.  Since onset it is stable. Additional comments: 3 month fu os and prp os/fp Pt states, "I cannot tell any real change in my vision. I do have some issues with my left eye feeling like it has trash in it." Denies any FOL/Floaters A1C:7.0 LBS:091        Comments   3 mos fu OU oct fu. Patient states vision is stable and unchanged since last visit. Denies any new floaters or FOL.       Last edited by Laurin Coder on 11/14/2020 10:55 AM.      Referring physician: Janie Morning, DO [address] [place],  [place] [address]  HISTORICAL INFORMATION:   Selected notes from the MEDICAL RECORD NUMBER    Lab Results  Component Value Date   HGBA1C 7.0 (H) 11/04/2017     CURRENT MEDICATIONS: Current Outpatient Medications (Ophthalmic Drugs)  Medication Sig   Polyethyl Glycol-Propyl Glycol (SYSTANE OP) Place 1 drop into both eyes 2 (two) times daily.   No current facility-administered medications for this visit. (Ophthalmic Drugs)   Current Outpatient Medications (Other)  Medication Sig   acetaminophen (TYLENOL) 650 MG CR tablet Take 1 tablet (650 mg total) by mouth 2 (two) times daily as needed for pain.   aspirin EC 81 MG tablet Take 81 mg by mouth daily.   Cholecalciferol (VITAMIN D3) 1000 units CAPS Take 1,000 Units by mouth 2 (two) times daily.    Cyanocobalamin (VITAMIN B 12  PO) Take 1,000 mcg by mouth daily.   ibrutinib (IMBRUVICA) 280 MG TABS TAKE 1 TABLET (280MG ) BY MOUTH DAILY.   insulin NPH-regular Human (70-30) 100 UNIT/ML injection Inject 35 Units into the skin 2 (two) times daily.   mirtazapine (REMERON) 15 MG tablet Take 1 tablet (15 mg total) by mouth at bedtime.   Multiple Vitamin (MULTIVITAMIN WITH MINERALS) TABS tablet Take 1 tablet by mouth daily.   pantoprazole (PROTONIX) 40 MG tablet Take 40 mg by mouth daily.   polyethylene glycol (MIRALAX / GLYCOLAX) packet Take 17 g by mouth daily.   ramipril (ALTACE) 5 MG capsule Take 5 mg by mouth daily.   tamsulosin (FLOMAX) 0.4 MG CAPS capsule Take 0.4 mg by mouth daily.   torsemide (DEMADEX) 20 MG tablet Take 20 mg by mouth daily.   No current facility-administered medications for this visit. (Other)      REVIEW OF SYSTEMS:    ALLERGIES No Known Allergies  PAST MEDICAL HISTORY Past Medical History:  Diagnosis Date   Anemia    Anginal pain ([place])    upon exertion   Arthritis    "left shoulder, lower back" (10/20/2016)   CLL (chronic lymphoblastic leukemia) dx'd 2000   Edema leg    Family history of adverse reaction to  anesthesia    "daughter's BP drops"    GERD (gastroesophageal reflux disease)    History of hiatal hernia    Leukemia, chronic lymphoid ([place]) 12/08/2010   Lymphocytosis    Type II diabetes mellitus ([place])    Past Surgical History:  Procedure Laterality Date   BOWEL RESECTION N/A 11/15/2017   Procedure: SMALL BOWEL RESECTION;  Surgeon: Coralie Keens, MD;  Location: [facility];  Service: General;  Laterality: N/A;   CATARACT EXTRACTION W/ INTRAOCULAR LENS  IMPLANT, BILATERAL Bilateral    EYE SURGERY     Cataracts (bilateral)   GROIN DISSECTION Left 11/15/2017   Procedure: GROIN EXPLORATION WITH REMOVAL OF INGUINAL HERNIA MESH;  Surgeon: Coralie Keens, MD;  Location: [facility];  Service: General;  Laterality: Left;   INGUINAL HERNIA REPAIR Left 11/10/2017   Procedure: LEFT  INGUINAL [place];  Surgeon: Coralie Keens, MD;  Location: [facility];  Service: General;  Laterality: Left;   INGUINAL HERNIA REPAIR Left 11/15/2017   Procedure: LEFT INGUINAL HERNIA REPAIR WITH MESH;  Surgeon: Coralie Keens, MD;  Location: [facility];  Service: General;  Laterality: Left;   INSERTION OF MESH Left 11/10/2017   Procedure: INSERTION OF MESH;  Surgeon: Coralie Keens, MD;  Location: [facility];  Service: General;  Laterality: Left;    FAMILY HISTORY Family History  Problem Relation Age of Onset   Cancer Mother    Hypertension Father    Diabetes Father     SOCIAL HISTORY Social History   Tobacco Use   Smoking status: Never   Smokeless tobacco: Former    Types: Snuff    Quit date: 2017  Vaping Use   Vaping Use: Never used  Substance Use Topics   Alcohol use: Not Currently   Drug use: No         OPHTHALMIC EXAM:  Base Eye Exam     Visual Acuity (ETDRS)       Right Left   Dist cc 20/20 20/100 -1   Dist ph cc  20/80 -2    Correction: Glasses         Tonometry (Tonopen, 10:58 AM)       Right Left   Pressure 12 13         Pupils       Pupils Dark Light Shape React APD   Right PERRL 3 3 Round Minimal None   Left PERRL 3 3 Round Minimal None         Extraocular Movement       Right Left    Full Full         Neuro/Psych     Oriented x3: Yes   Mood/Affect: Normal         Dilation     Both eyes: 1.0% Mydriacyl, 2.5% Phenylephrine @ 10:58 AM           Slit Lamp and Fundus Exam     External Exam       Right Left   External Normal Normal         Slit Lamp Exam       Right Left   Lids/Lashes Normal Normal   Conjunctiva/Sclera White and quiet White and quiet   Cornea Clear Clear   Anterior Chamber Deep and quiet Deep and quiet   Iris Round and reactive Round and reactive   Lens Centered posterior chamber intraocular lens Centered posterior chamber intraocular lens   Anterior Vitreous Normal Normal  Fundus Exam       Right Left   Posterior Vitreous Normal Posterior vitreous detachment, no change , no heme   Disc Normal Normal   C/D Ratio 0.5 0.5   Macula Microaneurysms, no clinically significant macular edema Microaneurysms   Vessels PDR-quiet PDR-quiet   Periphery room temp for laser room sup for prp            IMAGING AND PROCEDURES  Imaging and Procedures for 11/14/20  OCT, Retina - OU - Both Eyes       Right Eye Quality was good. Scan locations included subfoveal. Central Foveal Thickness: 339. Progression has been stable.   Left Eye Quality was good. Scan locations included subfoveal. Central Foveal Thickness: 328. Progression has been stable.   Notes No active maculopathy OU, will observe     Color Fundus Photography Optos - OU - Both Eyes       Right Eye Disc findings include normal observations. Macula : microaneurysms.   Left Eye Progression has been stable. Disc findings include normal observations. Macula : microaneurysms.   Notes Good PRP nasal to 70 degrees.  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 Proliferative diabetic retinopathy of left eye without macular edema associated with type 2 diabetes mellitus (HCC)  E11.3592 OCT, Retina - OU - Both Eyes    Color Fundus Photography Optos - OU - Both Eyes    2. Stable treated proliferative diabetic retinopathy of right eye determined by examination associated with type 2 diabetes  mellitus ([place])  E11.3551 OCT, Retina - OU - Both Eyes    Color Fundus Photography Optos - OU - Both Eyes      1.  OU, stable overall, no active disease we will continue to monitor follow-up in 6 months  2.  3.  Ophthalmic Meds Ordered this visit:  No orders of the defined types were placed in this encounter.      Return in about 6 months (around 05/15/2021) for DILATE OU, COLOR FP, OCT.  There are no Patient Instructions on file for this visit.   Explained the diagnoses, plan, and follow up with the patient and they expressed understanding.  Patient expressed understanding of the importance of proper follow up care.   Clent Demark Davinity Fanara M.D. Diseases & Surgery of the Retina and Vitreous [facility] 11/14/20     Abbreviations: M myopia (nearsighted); A astigmatism; H hyperopia (farsighted); P presbyopia; Mrx spectacle prescription;  CTL contact lenses; OD right eye; OS left eye; OU both eyes  XT exotropia; ET esotropia; PEK punctate epithelial keratitis; PEE punctate epithelial erosions; DES dry eye syndrome; MGD meibomian gland dysfunction; ATs artificial tears; PFAT's preservative free artificial tears; [place] nuclear sclerotic cataract; PSC posterior subcapsular cataract; ERM epi-retinal membrane; PVD posterior vitreous detachment; RD retinal detachment; DM diabetes mellitus; DR diabetic retinopathy; NPDR non-proliferative diabetic retinopathy; PDR proliferative diabetic retinopathy; CSME clinically significant macular edema; DME diabetic macular edema; dbh  dot blot hemorrhages; CWS cotton wool spot; POAG primary open angle glaucoma; C/D cup-to-disc ratio; HVF humphrey visual field; GVF goldmann visual field; OCT optical coherence tomography; IOP intraocular pressure; BRVO Branch retinal vein occlusion; CRVO central retinal vein occlusion; CRAO central retinal artery occlusion; BRAO branch retinal artery occlusion; RT retinal tear; SB scleral buckle; PPV pars plana  vitrectomy; VH Vitreous hemorrhage; PRP panretinal laser photocoagulation; IVK intravitreal kenalog; VMT vitreomacular traction; MH Macular hole;  NVD neovascularization of the disc; NVE neovascularization elsewhere; AREDS age related eye disease study; ARMD age related macular degeneration; POAG primary open angle glaucoma; EBMD epithelial/anterior basement membrane dystrophy; ACIOL anterior chamber intraocular lens; IOL intraocular lens; PCIOL posterior chamber intraocular lens; Phaco/IOL phacoemulsification with intraocular lens placement; [place] photorefractive keratectomy; LASIK laser assisted in situ keratomileusis; HTN hypertension; DM diabetes mellitus; COPD chronic obstructive pulmonary disease

## 2020-11-19 ENCOUNTER — Other Ambulatory Visit (HOSPITAL_COMMUNITY): Payer: Self-pay

## 2020-11-20 ENCOUNTER — Other Ambulatory Visit (HOSPITAL_COMMUNITY): Payer: Self-pay

## 2020-11-21 IMAGING — MR MR PELVIS WO/W CM
6 of 9 series · 33 of 48 positions shown · IV contrast (gadavist)
Comparison: Single-view of the pelvis 12/23/2017. CT abdomen and
pelvis 11/15/2017.

CLINICAL DATA: Sacral decubitus ulcer in a patient with diabetes
and chronic lymphocytic leukemia.

EXAM:
MRI OF THE SACRUM WITHOUT AND WITH CONTRAST
TECHNIQUE: Multiplanar, multisequence MR imaging of the sacrum was performed
both before and after administration of intravenous contrast.
CONTRAST:  7 cc Gadavist IV.

[Series 3: T2 · coronal · 5.0mm · 0.86mm/px · 8 of 48 slices shown (1 of 3)]
[im 1/48]
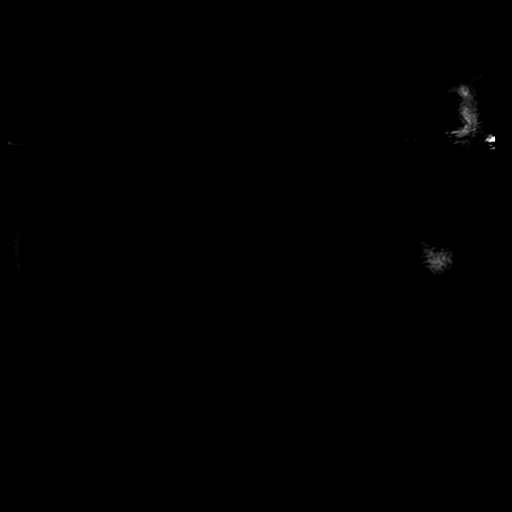
[im 7/48]
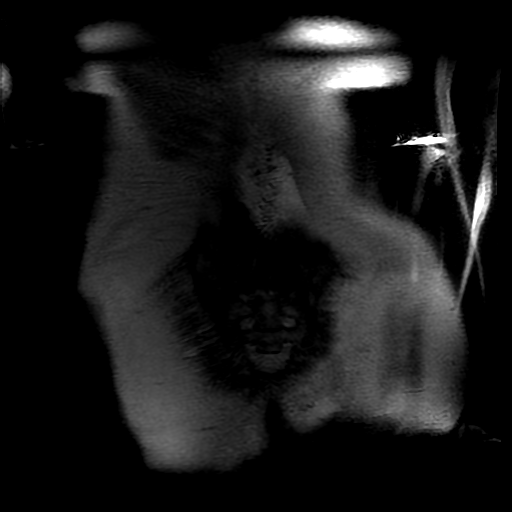
[im 14/48]
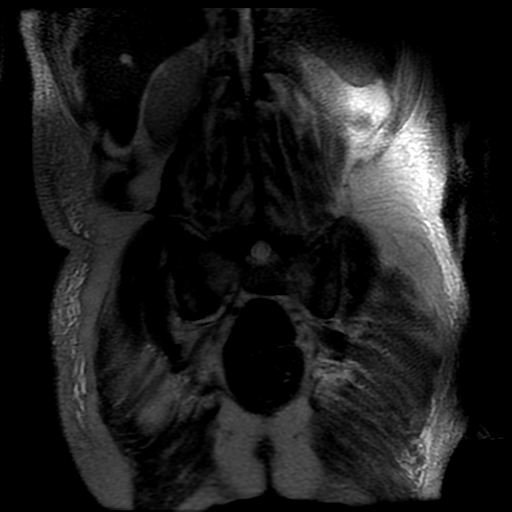
[im 21/48]
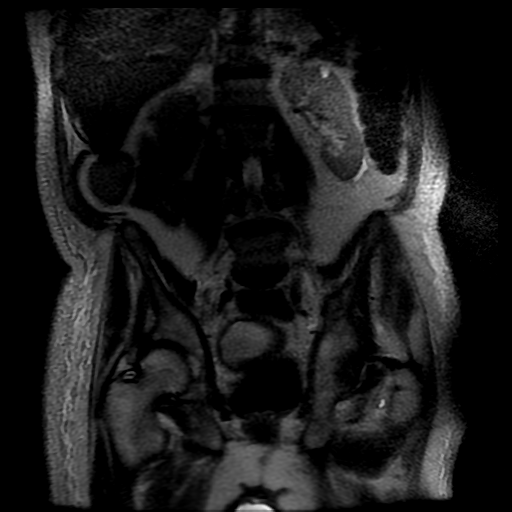
[im 27/48]
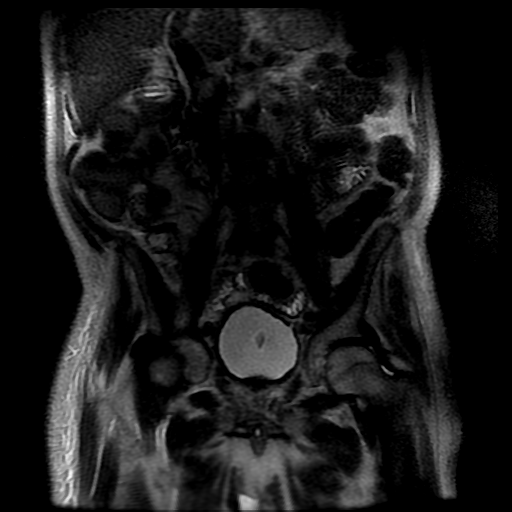
[im 34/48]
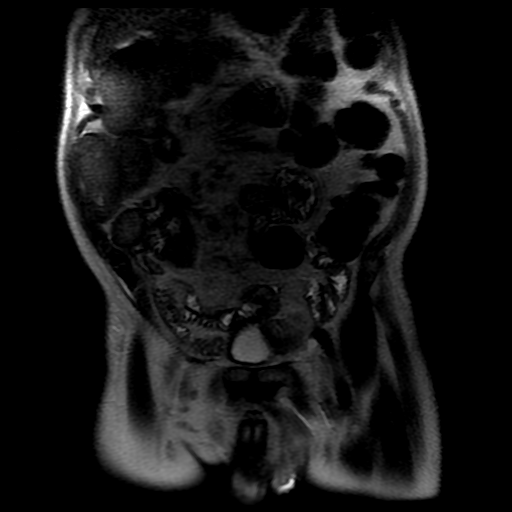
[im 41/48]
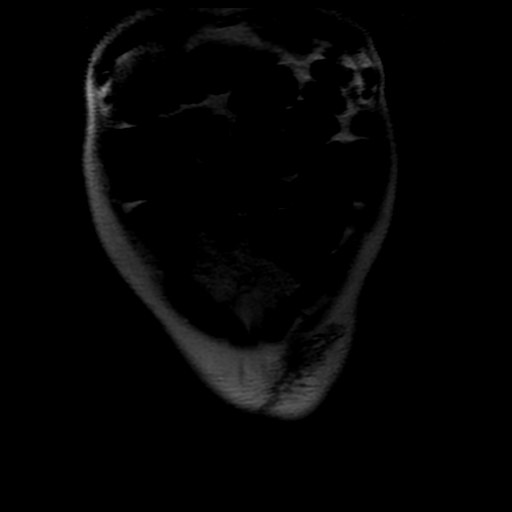
[im 48/48]
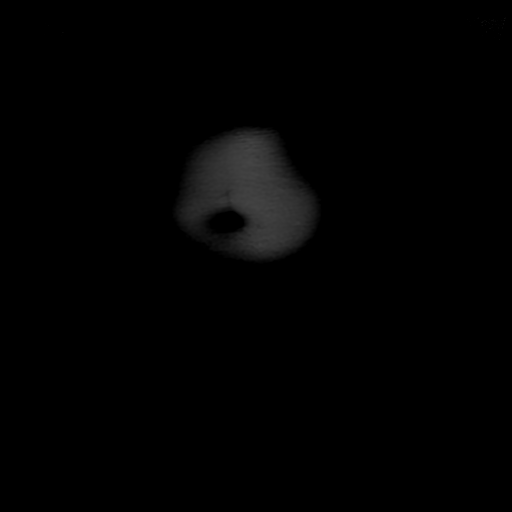

[Series 4: T2 · axial · 5.0mm · 0.94mm/px · z∈[-106,+104]mm · 6 of 36 slices shown (2 of 3)]
[im 1/36]
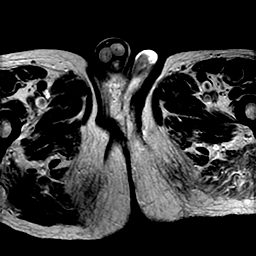
[im 8/36]
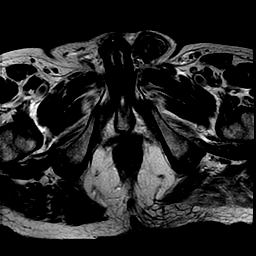
[im 15/36]
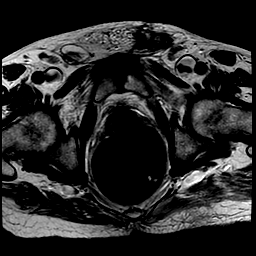
[im 22/36]
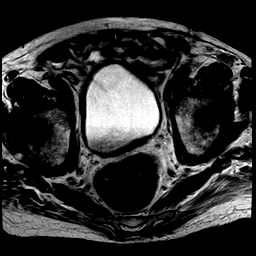
[im 29/36]
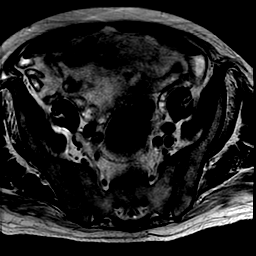
[im 36/36]
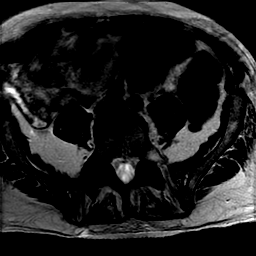

[Series 5: T2 fat-sat · axial · 5.0mm · 0.94mm/px · z∈[-106,+104]mm · 5 of 36 slices shown]
[im 1/36]
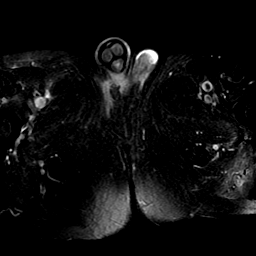
[im 9/36]
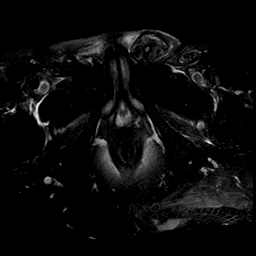
[im 18/36]
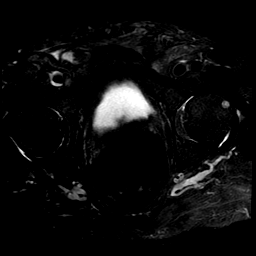
[im 27/36]
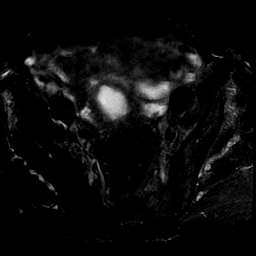
[im 36/36]
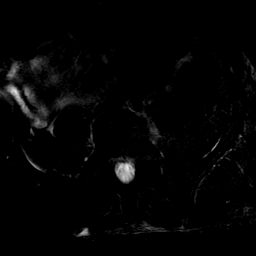

[Series 6: T1 · axial · 5.0mm · 0.94mm/px · z∈[-106,+104]mm · 5 of 36 slices shown]
[im 1/36]
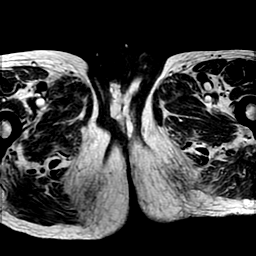
[im 9/36]
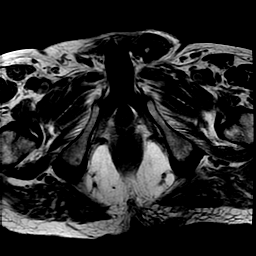
[im 18/36]
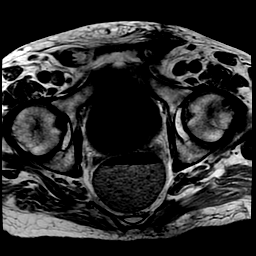
[im 27/36]
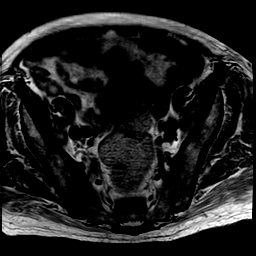
[im 36/36]
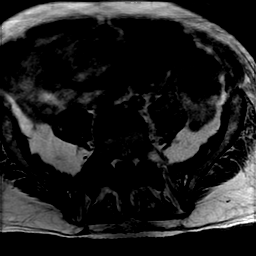

[Series 7: T2 · sagittal · 5.0mm · 1.02mm/px · 4 of 30 slices shown (3 of 3)]
[im 1/30]
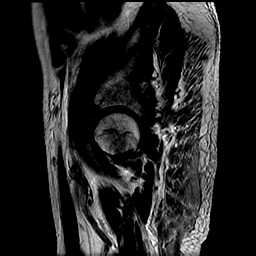
[im 10/30]
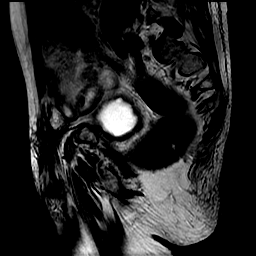
[im 20/30]
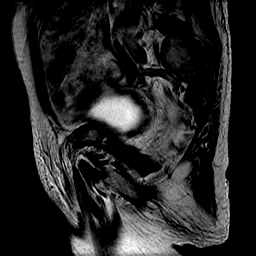
[im 30/30]
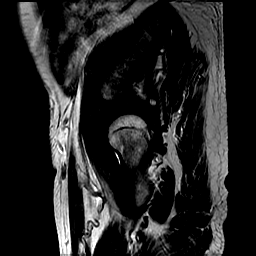

[Series 8: T1 fat-sat · axial · 5.0mm · 0.47mm/px · z∈[-106,+104]mm · 5 of 36 slices shown]
[im 1/36]
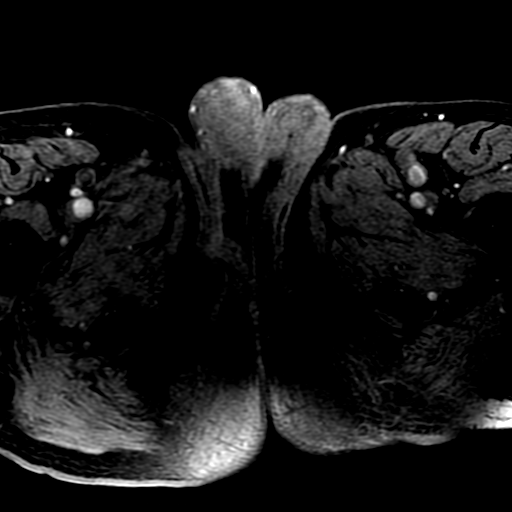
[im 9/36]
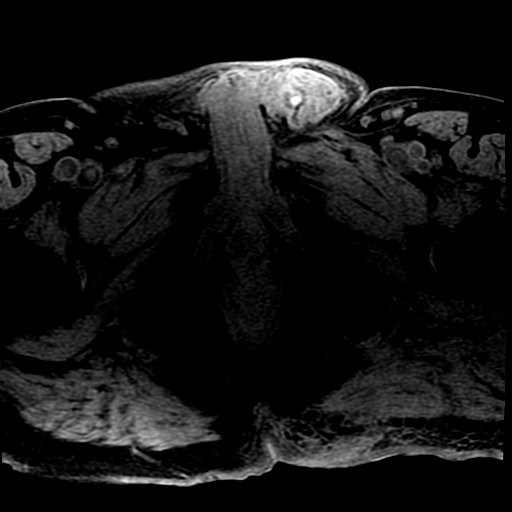
[im 18/36]
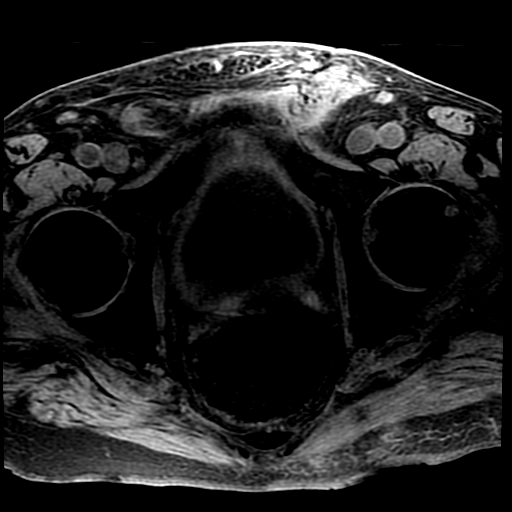
[im 27/36]
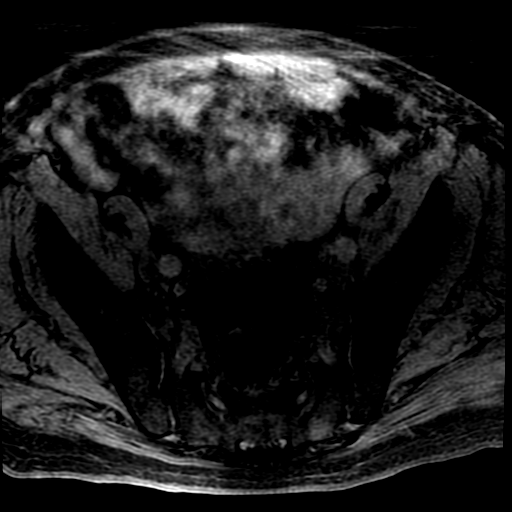
[im 36/36]
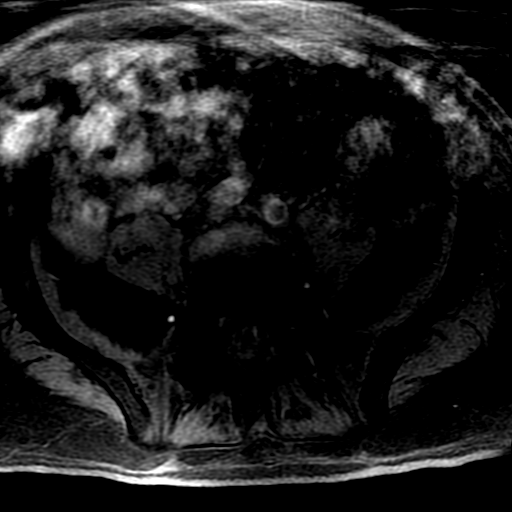

[33 of 48 positions shown; findings below may reference images not displayed]

FINDINGS: Bones/Joint/Cartilage

There is no bone marrow edema or enhancement to suggest
osteomyelitis. No acute bony or joint abnormality is identified.
Tiny synovial herniation pit at the head neck junction of the left
hip is noted. There is no joint effusion.

Ligaments

Negative.

Muscles and Tendons

A small area of edema and enhancement in the medial aspect of the
left gluteus maximus are consistent with myositis. No intramuscular
abscess is identified. No muscle or tendon tear.

Soft tissues

Subcutaneous edema and enhancement are seen in the left buttock
consistent with cellulitis. No abscess is identified. Large stool
ball in the rectum is noted. Left inguinal hernia seen on the prior
CT scan has been repaired.
IMPRESSION: Findings consistent with cellulitis of the left buttock and myositis
in the underlying medial aspect of the left gluteus maximus.
Negative for abscess, osteomyelitis or septic joint.

Status post repair of a left inguinal hernia.

## 2020-11-25 ENCOUNTER — Other Ambulatory Visit (HOSPITAL_COMMUNITY): Payer: Self-pay

## 2020-11-25 DIAGNOSIS — N182 Chronic kidney disease, stage 2 (mild): Secondary | ICD-10-CM | POA: Diagnosis not present

## 2020-11-25 DIAGNOSIS — I129 Hypertensive chronic kidney disease with stage 1 through stage 4 chronic kidney disease, or unspecified chronic kidney disease: Secondary | ICD-10-CM | POA: Diagnosis not present

## 2020-11-25 DIAGNOSIS — E785 Hyperlipidemia, unspecified: Secondary | ICD-10-CM | POA: Diagnosis not present

## 2020-11-25 DIAGNOSIS — E1122 Type 2 diabetes mellitus with diabetic chronic kidney disease: Secondary | ICD-10-CM | POA: Diagnosis not present

## 2020-12-16 ENCOUNTER — Other Ambulatory Visit (HOSPITAL_COMMUNITY): Payer: Self-pay

## 2020-12-16 ENCOUNTER — Inpatient Hospital Stay: Payer: Medicare HMO | Admitting: Hematology and Oncology

## 2020-12-16 ENCOUNTER — Other Ambulatory Visit: Payer: Self-pay

## 2020-12-16 ENCOUNTER — Telehealth: Payer: Self-pay

## 2020-12-16 ENCOUNTER — Encounter: Payer: Self-pay | Admitting: Hematology and Oncology

## 2020-12-16 ENCOUNTER — Inpatient Hospital Stay: Payer: Medicare HMO | Attending: Hematology and Oncology

## 2020-12-16 DIAGNOSIS — C9110 Chronic lymphocytic leukemia of B-cell type not having achieved remission: Secondary | ICD-10-CM

## 2020-12-16 DIAGNOSIS — N183 Chronic kidney disease, stage 3 unspecified: Secondary | ICD-10-CM | POA: Diagnosis not present

## 2020-12-16 DIAGNOSIS — D630 Anemia in neoplastic disease: Secondary | ICD-10-CM | POA: Diagnosis not present

## 2020-12-16 LAB — CBC WITH DIFFERENTIAL/PLATELET
Abs Immature Granulocytes: 0.06 10*3/uL (ref 0.00–0.07)
Basophils Absolute: 0 10*3/uL (ref 0.0–0.1)
Basophils Relative: 0 %
Eosinophils Absolute: 0.1 10*3/uL (ref 0.0–0.5)
Eosinophils Relative: 1 %
HCT: 31.8 % — ABNORMAL LOW (ref 39.0–52.0)
Hemoglobin: 10.6 g/dL — ABNORMAL LOW (ref 13.0–17.0)
Immature Granulocytes: 1 %
Lymphocytes Relative: 46 %
Lymphs Abs: 4.3 10*3/uL — ABNORMAL HIGH (ref 0.7–4.0)
MCH: 28.9 pg (ref 26.0–34.0)
MCHC: 33.3 g/dL (ref 30.0–36.0)
MCV: 86.6 fL (ref 80.0–100.0)
Monocytes Absolute: 0.4 10*3/uL (ref 0.1–1.0)
Monocytes Relative: 5 %
Neutro Abs: 4.4 10*3/uL (ref 1.7–7.7)
Neutrophils Relative %: 47 %
Platelets: 193 10*3/uL (ref 150–400)
RBC: 3.67 MIL/uL — ABNORMAL LOW (ref 4.22–5.81)
RDW: 14.3 % (ref 11.5–15.5)
WBC: 9.3 10*3/uL (ref 4.0–10.5)
nRBC: 0 % (ref 0.0–0.2)

## 2020-12-16 LAB — COMPREHENSIVE METABOLIC PANEL
ALT: 9 U/L (ref 0–44)
AST: 15 U/L (ref 15–41)
Albumin: 3.3 g/dL — ABNORMAL LOW (ref 3.5–5.0)
Alkaline Phosphatase: 55 U/L (ref 38–126)
Anion gap: 9 (ref 5–15)
BUN: 38 mg/dL — ABNORMAL HIGH (ref 8–23)
CO2: 23 mmol/L (ref 22–32)
Calcium: 8.3 mg/dL — ABNORMAL LOW (ref 8.9–10.3)
Chloride: 104 mmol/L (ref 98–111)
Creatinine, Ser: 1.52 mg/dL — ABNORMAL HIGH (ref 0.61–1.24)
GFR, Estimated: 44 mL/min — ABNORMAL LOW (ref >60)
Glucose, Bld: 145 mg/dL — ABNORMAL HIGH (ref 70–99)
Potassium: 4.2 mmol/L (ref 3.5–5.1)
Sodium: 136 mmol/L (ref 135–145)
Total Bilirubin: 0.3 mg/dL (ref 0.3–1.2)
Total Protein: 6.2 g/dL — ABNORMAL LOW (ref 6.5–8.1)

## 2020-12-16 NOTE — Assessment & Plan Note (Signed)
He has intermittent elevated serum creatinine We discussed importance of adequate hydration and risk factor modification 

## 2020-12-16 NOTE — Assessment & Plan Note (Signed)
Anemia is stable It is likely related to chronic kidney disease and his underlying bone marrow disorder He is not symptomatic Observe only 

## 2020-12-16 NOTE — Assessment & Plan Note (Signed)
He tolerated treatment very well Lymphocytosis is stable His hemoglobin is stable We will continue treatment without dose adjustment I will see him back 4  months for follow-up I do not plan surveillance imaging studies unless he have signs or symptoms to suggest cancer progression

## 2020-12-16 NOTE — Telephone Encounter (Signed)
Spoke with patient regarding today's labs. Patient advised to drink plenty of fluids as his creatinine was elevated. Patient verbalized an understanding of the information and appreciated the call.

## 2020-12-16 NOTE — Progress Notes (Signed)
[facility] [name] OFFICE PROGRESS NOTE  Patient Care Team: Janie Morning, DO as PCP - General (Family Medicine) Heath Lark, MD as Consulting Physician (Hematology and Oncology)  ASSESSMENT & PLAN:  CLL (chronic lymphocytic leukemia) ([place]) He tolerated treatment very well Lymphocytosis is stable His hemoglobin is stable We will continue treatment without dose adjustment I will see him back 4  months for follow-up I do not plan surveillance imaging studies unless he have signs or symptoms to suggest cancer progression  Chronic kidney disease, stage III (moderate) He has intermittent elevated serum creatinine We discussed importance of adequate hydration and risk factor modification  Anemia in neoplastic disease Anemia is stable It is likely related to chronic kidney disease and his underlying bone marrow disorder He is not symptomatic Observe only  No orders of the defined types were placed in this encounter.   All questions were answered. The patient knows to call the clinic with any problems, questions or concerns. The total time spent in the appointment was 20 minutes encounter with patients including review of chart and various tests results, discussions about plan of care and coordination of care plan   Heath Lark, MD 12/16/2020 3:50 PM  INTERVAL HISTORY: Please see below for problem oriented charting. he returns for treatment follow-up on ibrutinib for CLL He denies side effects from treatment No recent infection Denies bruises or abnormal heart palpitation  REVIEW OF SYSTEMS:   Constitutional: Denies fevers, chills or abnormal weight loss Eyes: Denies blurriness of vision Ears, nose, mouth, throat, and face: Denies mucositis or sore throat Respiratory: Denies cough, dyspnea or wheezes Cardiovascular: Denies palpitation, chest discomfort Gastrointestinal:  Denies nausea, heartburn or change in bowel habits Skin: Denies abnormal skin rashes Lymphatics:  Denies new lymphadenopathy or easy bruising Neurological:Denies numbness, tingling or new weaknesses Behavioral/Psych: Mood is stable, no new changes  All other systems were reviewed with the patient and are negative.  I have reviewed the past medical history, past surgical history, social history and family history with the patient and they are unchanged from previous note.  ALLERGIES:  has No Known Allergies.  MEDICATIONS:  Current Outpatient Medications  Medication Sig Dispense Refill   acetaminophen (TYLENOL) 650 MG CR tablet Take 1 tablet (650 mg total) by mouth 2 (two) times daily as needed for pain. 30 tablet 0   aspirin EC 81 MG tablet Take 81 mg by mouth daily.     Cholecalciferol (VITAMIN D3) 1000 units CAPS Take 1,000 Units by mouth 2 (two) times daily.      Cyanocobalamin (VITAMIN B 12 PO) Take 1,000 mcg by mouth daily.     ibrutinib (IMBRUVICA) 280 MG TABS TAKE 1 TABLET (280MG ) BY MOUTH DAILY. 28 tablet 11   insulin NPH-regular Human (70-30) 100 UNIT/ML injection Inject 35 Units into the skin 2 (two) times daily.     mirtazapine (REMERON) 15 MG tablet Take 1 tablet (15 mg total) by mouth at bedtime.     Multiple Vitamin (MULTIVITAMIN WITH MINERALS) TABS tablet Take 1 tablet by mouth daily.     pantoprazole (PROTONIX) 40 MG tablet Take 40 mg by mouth daily.     Polyethyl Glycol-Propyl Glycol (SYSTANE OP) Place 1 drop into both eyes 2 (two) times daily.     polyethylene glycol (MIRALAX / GLYCOLAX) packet Take 17 g by mouth daily. 14 each 0   ramipril (ALTACE) 5 MG capsule Take 5 mg by mouth daily.     tamsulosin (FLOMAX) 0.4 MG CAPS capsule Take 0.4  mg by mouth daily.     torsemide (DEMADEX) 20 MG tablet Take 20 mg by mouth daily.     No current facility-administered medications for this visit.    SUMMARY OF ONCOLOGIC HISTORY: Oncology History  CLL (chronic lymphocytic leukemia) ([place])  04/03/2008 Pathology Results   Case #: [id number] FLow cytomtry confirmed CLL   10/11/2014  Tumor Marker   FISH analysis showed trisomy 12 abnormality   04/29/2015 -  Chemotherapy   He started taking Ibrutinib   04/29/2015 Imaging   Ct scan showed mild lymphadenopathy and splenomegaly   05/13/2015 Adverse Reaction   Due to neutropenia, dose of Ibrutinib is reduced to 280 mg daily   01/08/2016 Imaging   Response to therapy. Resolution of thoracic and abdominal adenopathy. Resolution of splenomegaly. 2. No new or progressive disease. 3. Small bowel containing left inguinal hernia and right-sided hydrocele, as before. 4. Esophageal air fluid level suggests dysmotility or gastroesophageal reflux. 5.  Possible constipation. 6. Suspect hepatic hemangiomas, similar.   10/09/2016 Imaging   No findings suspicious for active lymphomatous involvement.  No abdominopelvic lymphadenopathy.  Spleen is normal in size.  Moderate to large left inguinal/scrotal hernia containing small bowel and fat. No evidence of bowel obstruction.  Additional stable ancillary findings as above.   04/26/2017 Imaging   1. No lymphadenopathy in the chest, abdomen, or pelvis. 2. Mild circumferential bladder wall thickening. Incomplete distention may contribute to this appearance, but bladder infection is also consideration. 3. Stable appearance small hepatic lesions. Imaging features most suggestive of benign cavernous hemangiomas. 4. Stable tiny low-density renal lesions compatible with cysts. 5. Large left groin hernia contains numerous small bowel loops without complicating features. 6. Right-sided hydrocele.   11/15/2017 Imaging   Large recurrent left inguinal hernia with extension of multiple small bowel loops into the inguinal canal and scrotum on the left. Proximal small bowel dilatation is noted consistent with a degree of incarceration.   Bilateral small effusions as well as patchy bibasilar infiltrates.     12/14/2017 Adverse Reaction   Ibrutinib placed on hold   06/17/2020 Cancer Staging   Staging  form: Chronic Lymphocytic Leukemia / Small Lymphocytic Lymphoma, AJCC 8th Edition - Clinical stage from 06/17/2020: Modified Rai Stage 0 (Modified Rai risk: Low, Lymphocytosis: Present, Adenopathy: Absent, Organomegaly: Absent, Anemia: Absent, Thrombocytopenia: Absent) - Signed by Heath Lark, MD on 06/17/2020 Stage prefix: Initial diagnosis      PHYSICAL EXAMINATION: ECOG PERFORMANCE STATUS: 1 - Symptomatic but completely ambulatory  Vitals:   12/16/20 1057  BP: (!) 131/45  Pulse: 82  Resp: 18  Temp: 98.1 F (36.7 C)  SpO2: 100%   Filed Weights   12/16/20 1057  Weight: 207 lb 9.6 oz (94.2 kg)    GENERAL:alert, no distress and comfortable SKIN: skin color, texture, turgor are normal, no rashes or significant lesions EYES: normal, Conjunctiva are pink and non-injected, sclera clear OROPHARYNX:no exudate, no erythema and lips, buccal mucosa, and tongue normal  NECK: supple, thyroid normal size, non-tender, without nodularity LYMPH:  no palpable lymphadenopathy in the cervical, axillary or inguinal LUNGS: clear to auscultation and percussion with normal breathing effort HEART: regular rate & rhythm and no murmurs with stable moderate lower extremity edema ABDOMEN:abdomen soft, non-tender and normal bowel sounds Musculoskeletal:no cyanosis of digits and no clubbing  NEURO: alert & oriented x 3 with fluent speech, no focal motor/sensory deficits  LABORATORY DATA:  I have reviewed the data as listed    Component Value Date/Time   NA 136 12/16/2020  1037   NA 140 10/09/2016 1106   K 4.2 12/16/2020 1037   K 4.7 10/09/2016 1106   CL 104 12/16/2020 1037   CL 104 03/28/2012 1426   CO2 23 12/16/2020 1037   CO2 28 10/09/2016 1106   GLUCOSE 145 (H) 12/16/2020 1037   GLUCOSE 127 10/09/2016 1106   GLUCOSE 171 (H) 03/28/2012 1426   BUN 38 (H) 12/16/2020 1037   BUN 20.8 10/09/2016 1106   CREATININE 1.52 (H) 12/16/2020 1037   CREATININE 1.15 09/06/2018 1016   CREATININE 1.1  10/09/2016 1106   CALCIUM 8.3 (L) 12/16/2020 1037   CALCIUM 9.1 10/09/2016 1106   PROT 6.2 (L) 12/16/2020 1037   PROT 5.8 (L) 10/09/2016 1106   ALBUMIN 3.3 (L) 12/16/2020 1037   ALBUMIN 3.1 (L) 10/09/2016 1106   AST 15 12/16/2020 1037   AST 18 09/06/2018 1016   AST 17 10/09/2016 1106   ALT 9 12/16/2020 1037   ALT 12 09/06/2018 1016   ALT 8 10/09/2016 1106   ALKPHOS 55 12/16/2020 1037   ALKPHOS 46 10/09/2016 1106   BILITOT 0.3 12/16/2020 1037   BILITOT 0.3 09/06/2018 1016   BILITOT 0.23 10/09/2016 1106   GFRNONAA 44 (L) 12/16/2020 1037   GFRNONAA 57 (L) 09/06/2018 1016   GFRAA 59 (L) 09/12/2019 1034   GFRAA >60 09/06/2018 1016    No results found for: SPEP, UPEP  Lab Results  Component Value Date   WBC 9.3 12/16/2020   NEUTROABS 4.4 12/16/2020   HGB 10.6 (L) 12/16/2020   HCT 31.8 (L) 12/16/2020   MCV 86.6 12/16/2020   PLT 193 12/16/2020      Chemistry      Component Value Date/Time   NA 136 12/16/2020 1037   NA 140 10/09/2016 1106   K 4.2 12/16/2020 1037   K 4.7 10/09/2016 1106   CL 104 12/16/2020 1037   CL 104 03/28/2012 1426   CO2 23 12/16/2020 1037   CO2 28 10/09/2016 1106   BUN 38 (H) 12/16/2020 1037   BUN 20.8 10/09/2016 1106   CREATININE 1.52 (H) 12/16/2020 1037   CREATININE 1.15 09/06/2018 1016   CREATININE 1.1 10/09/2016 1106      Component Value Date/Time   CALCIUM 8.3 (L) 12/16/2020 1037   CALCIUM 9.1 10/09/2016 1106   ALKPHOS 55 12/16/2020 1037   ALKPHOS 46 10/09/2016 1106   AST 15 12/16/2020 1037   AST 18 09/06/2018 1016   AST 17 10/09/2016 1106   ALT 9 12/16/2020 1037   ALT 12 09/06/2018 1016   ALT 8 10/09/2016 1106   BILITOT 0.3 12/16/2020 1037   BILITOT 0.3 09/06/2018 1016   BILITOT 0.23 10/09/2016 1106

## 2020-12-16 NOTE — Telephone Encounter (Signed)
-----   Message from Heath Lark, MD sent at 12/16/2020 11:37 AM EST ----- Regarding: CMP Labs showed creatinine is elevated Remind him to drink more water

## 2020-12-17 DIAGNOSIS — N182 Chronic kidney disease, stage 2 (mild): Secondary | ICD-10-CM | POA: Diagnosis not present

## 2020-12-17 DIAGNOSIS — C9190 Lymphoid leukemia, unspecified not having achieved remission: Secondary | ICD-10-CM | POA: Diagnosis not present

## 2020-12-17 DIAGNOSIS — E7800 Pure hypercholesterolemia, unspecified: Secondary | ICD-10-CM | POA: Diagnosis not present

## 2020-12-17 DIAGNOSIS — I890 Lymphedema, not elsewhere classified: Secondary | ICD-10-CM | POA: Diagnosis not present

## 2020-12-17 DIAGNOSIS — N401 Enlarged prostate with lower urinary tract symptoms: Secondary | ICD-10-CM | POA: Diagnosis not present

## 2020-12-17 DIAGNOSIS — E1142 Type 2 diabetes mellitus with diabetic polyneuropathy: Secondary | ICD-10-CM | POA: Diagnosis not present

## 2020-12-18 ENCOUNTER — Other Ambulatory Visit (HOSPITAL_COMMUNITY): Payer: Self-pay

## 2021-01-13 ENCOUNTER — Other Ambulatory Visit (HOSPITAL_COMMUNITY): Payer: Self-pay

## 2021-01-14 ENCOUNTER — Other Ambulatory Visit (HOSPITAL_COMMUNITY): Payer: Self-pay

## 2021-02-06 ENCOUNTER — Other Ambulatory Visit (HOSPITAL_COMMUNITY): Payer: Self-pay

## 2021-02-07 ENCOUNTER — Other Ambulatory Visit (HOSPITAL_COMMUNITY): Payer: Self-pay

## 2021-02-10 ENCOUNTER — Other Ambulatory Visit (HOSPITAL_COMMUNITY): Payer: Self-pay

## 2021-03-04 ENCOUNTER — Other Ambulatory Visit (HOSPITAL_COMMUNITY): Payer: Self-pay

## 2021-03-05 ENCOUNTER — Other Ambulatory Visit (HOSPITAL_COMMUNITY): Payer: Self-pay

## 2021-03-10 DIAGNOSIS — E1142 Type 2 diabetes mellitus with diabetic polyneuropathy: Secondary | ICD-10-CM | POA: Diagnosis not present

## 2021-03-10 DIAGNOSIS — N182 Chronic kidney disease, stage 2 (mild): Secondary | ICD-10-CM | POA: Diagnosis not present

## 2021-03-10 DIAGNOSIS — E7800 Pure hypercholesterolemia, unspecified: Secondary | ICD-10-CM | POA: Diagnosis not present

## 2021-03-10 DIAGNOSIS — C9190 Lymphoid leukemia, unspecified not having achieved remission: Secondary | ICD-10-CM | POA: Diagnosis not present

## 2021-03-11 ENCOUNTER — Other Ambulatory Visit (HOSPITAL_COMMUNITY): Payer: Self-pay

## 2021-03-17 DIAGNOSIS — E113592 Type 2 diabetes mellitus with proliferative diabetic retinopathy without macular edema, left eye: Secondary | ICD-10-CM | POA: Diagnosis not present

## 2021-03-17 DIAGNOSIS — Z23 Encounter for immunization: Secondary | ICD-10-CM | POA: Diagnosis not present

## 2021-03-17 DIAGNOSIS — C9190 Lymphoid leukemia, unspecified not having achieved remission: Secondary | ICD-10-CM | POA: Diagnosis not present

## 2021-03-17 DIAGNOSIS — E1142 Type 2 diabetes mellitus with diabetic polyneuropathy: Secondary | ICD-10-CM | POA: Diagnosis not present

## 2021-03-17 DIAGNOSIS — Z794 Long term (current) use of insulin: Secondary | ICD-10-CM | POA: Diagnosis not present

## 2021-03-17 DIAGNOSIS — E113551 Type 2 diabetes mellitus with stable proliferative diabetic retinopathy, right eye: Secondary | ICD-10-CM | POA: Diagnosis not present

## 2021-03-17 DIAGNOSIS — R0981 Nasal congestion: Secondary | ICD-10-CM | POA: Diagnosis not present

## 2021-03-17 DIAGNOSIS — Z0000 Encounter for general adult medical examination without abnormal findings: Secondary | ICD-10-CM | POA: Diagnosis not present

## 2021-03-17 DIAGNOSIS — I739 Peripheral vascular disease, unspecified: Secondary | ICD-10-CM | POA: Diagnosis not present

## 2021-03-17 DIAGNOSIS — I890 Lymphedema, not elsewhere classified: Secondary | ICD-10-CM | POA: Diagnosis not present

## 2021-04-03 ENCOUNTER — Other Ambulatory Visit (HOSPITAL_COMMUNITY): Payer: Self-pay

## 2021-04-08 ENCOUNTER — Other Ambulatory Visit (HOSPITAL_COMMUNITY): Payer: Self-pay

## 2021-04-15 ENCOUNTER — Encounter: Payer: Self-pay | Admitting: Hematology and Oncology

## 2021-04-15 ENCOUNTER — Other Ambulatory Visit: Payer: Self-pay

## 2021-04-15 ENCOUNTER — Inpatient Hospital Stay: Payer: Medicare HMO | Admitting: Hematology and Oncology

## 2021-04-15 ENCOUNTER — Inpatient Hospital Stay: Payer: Medicare HMO | Attending: Hematology and Oncology

## 2021-04-15 DIAGNOSIS — D630 Anemia in neoplastic disease: Secondary | ICD-10-CM | POA: Diagnosis not present

## 2021-04-15 DIAGNOSIS — Z79899 Other long term (current) drug therapy: Secondary | ICD-10-CM | POA: Diagnosis not present

## 2021-04-15 DIAGNOSIS — N183 Chronic kidney disease, stage 3 unspecified: Secondary | ICD-10-CM | POA: Insufficient documentation

## 2021-04-15 DIAGNOSIS — C9110 Chronic lymphocytic leukemia of B-cell type not having achieved remission: Secondary | ICD-10-CM | POA: Insufficient documentation

## 2021-04-15 DIAGNOSIS — R600 Localized edema: Secondary | ICD-10-CM | POA: Diagnosis not present

## 2021-04-15 LAB — COMPREHENSIVE METABOLIC PANEL
ALT: 7 U/L (ref 0–44)
AST: 13 U/L — ABNORMAL LOW (ref 15–41)
Albumin: 3.9 g/dL (ref 3.5–5.0)
Alkaline Phosphatase: 50 U/L (ref 38–126)
Anion gap: 7 (ref 5–15)
BUN: 41 mg/dL — ABNORMAL HIGH (ref 8–23)
CO2: 20 mmol/L — ABNORMAL LOW (ref 22–32)
Calcium: 9.1 mg/dL (ref 8.9–10.3)
Chloride: 111 mmol/L (ref 98–111)
Creatinine, Ser: 1.41 mg/dL — ABNORMAL HIGH (ref 0.61–1.24)
GFR, Estimated: 48 mL/min — ABNORMAL LOW (ref >60)
Glucose, Bld: 161 mg/dL — ABNORMAL HIGH (ref 70–99)
Potassium: 4.5 mmol/L (ref 3.5–5.1)
Sodium: 138 mmol/L (ref 135–145)
Total Bilirubin: 0.4 mg/dL (ref 0.3–1.2)
Total Protein: 6.8 g/dL (ref 6.5–8.1)

## 2021-04-15 LAB — CBC WITH DIFFERENTIAL/PLATELET
Abs Immature Granulocytes: 0.04 10*3/uL (ref 0.00–0.07)
Basophils Absolute: 0 10*3/uL (ref 0.0–0.1)
Basophils Relative: 1 %
Eosinophils Absolute: 0.1 10*3/uL (ref 0.0–0.5)
Eosinophils Relative: 1 %
HCT: 33.5 % — ABNORMAL LOW (ref 39.0–52.0)
Hemoglobin: 11 g/dL — ABNORMAL LOW (ref 13.0–17.0)
Immature Granulocytes: 1 %
Lymphocytes Relative: 49 %
Lymphs Abs: 3.7 10*3/uL (ref 0.7–4.0)
MCH: 28.2 pg (ref 26.0–34.0)
MCHC: 32.8 g/dL (ref 30.0–36.0)
MCV: 85.9 fL (ref 80.0–100.0)
Monocytes Absolute: 0.4 10*3/uL (ref 0.1–1.0)
Monocytes Relative: 5 %
Neutro Abs: 3.2 10*3/uL (ref 1.7–7.7)
Neutrophils Relative %: 43 %
Platelets: 209 10*3/uL (ref 150–400)
RBC: 3.9 MIL/uL — ABNORMAL LOW (ref 4.22–5.81)
RDW: 14.9 % (ref 11.5–15.5)
WBC: 7.5 10*3/uL (ref 4.0–10.5)
nRBC: 0 % (ref 0.0–0.2)

## 2021-04-15 NOTE — Assessment & Plan Note (Signed)
He tolerated treatment very well ?Lymphocytosis is resolved ?His hemoglobin is stable ?We will continue treatment without dose adjustment ?I will see him back 4  months for follow-up ?I do not plan surveillance imaging studies unless he have signs or symptoms to suggest cancer progression ?

## 2021-04-15 NOTE — Assessment & Plan Note (Signed)
Anemia is stable It is likely related to chronic kidney disease and his underlying bone marrow disorder He is not symptomatic Observe only 

## 2021-04-15 NOTE — Assessment & Plan Note (Signed)
He has intermittent elevated serum creatinine We discussed importance of adequate hydration and risk factor modification 

## 2021-04-15 NOTE — Progress Notes (Signed)
[facility] ?OFFICE PROGRESS NOTE ? ?Patient Care Team: ?Janie Morning, DO as PCP - General (Family Medicine) ?Heath Lark, MD as Consulting Physician (Hematology and Oncology) ? ?ASSESSMENT & PLAN:  ?CLL (chronic lymphocytic leukemia) ([place]) ?He tolerated treatment very well ?Lymphocytosis is resolved ?His hemoglobin is stable ?We will continue treatment without dose adjustment ?I will see him back 4  months for follow-up ?I do not plan surveillance imaging studies unless he have signs or symptoms to suggest cancer progression ? ?Anemia in neoplastic disease ?Anemia is stable ?It is likely related to chronic kidney disease and his underlying bone marrow disorder ?He is not symptomatic ?Observe only ? ?Chronic kidney disease, stage III (moderate) ?He has intermittent elevated serum creatinine ?We discussed importance of adequate hydration and risk factor modification ? ?Bilateral leg edema ?He has chronic bilateral lower extremity edema ?He will continue diuretic therapy as prescribed by his primary care doctor ? ?No orders of the defined types were placed in this encounter. ? ? ?All questions were answered. The patient knows to call the clinic with any problems, questions or concerns. ?The total time spent in the appointment was 20 minutes encounter with patients including review of chart and various tests results, discussions about plan of care and coordination of care plan ?  ?Heath Lark, MD ?04/15/2021 10:57 AM ? ?INTERVAL HISTORY: ?Please see below for problem oriented charting. ?he returns for treatment follow-up on ibrutinib for CLL ?He is doing well ?His chronic lower extremity edema is stable ?Denies excessive bruises ?Denies heart palpitation ?No recent infection, fever or chills ? ?REVIEW OF SYSTEMS:   ?Constitutional: Denies fevers, chills or abnormal weight loss ?Eyes: Denies blurriness of vision ?Ears, nose, mouth, throat, and face: Denies mucositis or sore throat ?Respiratory: Denies  cough, dyspnea or wheezes ?Cardiovascular: Denies palpitation, chest discomfort  ?Gastrointestinal:  Denies nausea, heartburn or change in bowel habits ?Skin: Denies abnormal skin rashes ?Lymphatics: Denies new lymphadenopathy or easy bruising ?Neurological:Denies numbness, tingling or new weaknesses ?Behavioral/Psych: Mood is stable, no new changes  ?All other systems were reviewed with the patient and are negative. ? ?I have reviewed the past medical history, past surgical history, social history and family history with the patient and they are unchanged from previous note. ? ?ALLERGIES:  has No Known Allergies. ? ?MEDICATIONS:  ?Current Outpatient Medications  ?Medication Sig Dispense Refill  ? acetaminophen (TYLENOL) 650 MG CR tablet Take 1 tablet (650 mg total) by mouth 2 (two) times daily as needed for pain. 30 tablet 0  ? aspirin EC 81 MG tablet Take 81 mg by mouth daily.    ? Cholecalciferol (VITAMIN D3) 1000 units CAPS Take 1,000 Units by mouth 2 (two) times daily.     ? Cyanocobalamin (VITAMIN B 12 PO) Take 1,000 mcg by mouth daily.    ? ibrutinib (IMBRUVICA) 280 MG tablet TAKE 1 TABLET ('280MG'$ ) BY MOUTH DAILY. 28 tablet 11  ? insulin NPH-regular Human (70-30) 100 UNIT/ML injection Inject 35 Units into the skin 2 (two) times daily.    ? mirtazapine (REMERON) 15 MG tablet Take 1 tablet (15 mg total) by mouth at bedtime.    ? Multiple Vitamin (MULTIVITAMIN WITH MINERALS) TABS tablet Take 1 tablet by mouth daily.    ? pantoprazole (PROTONIX) 40 MG tablet Take 40 mg by mouth daily.    ? Polyethyl Glycol-Propyl Glycol (SYSTANE OP) Place 1 drop into both eyes 2 (two) times daily.    ? polyethylene glycol (MIRALAX / GLYCOLAX) packet Take 17  g by mouth daily. 14 each 0  ? ramipril (ALTACE) 5 MG capsule Take 5 mg by mouth daily.    ? tamsulosin (FLOMAX) 0.4 MG CAPS capsule Take 0.4 mg by mouth daily.    ? torsemide (DEMADEX) 20 MG tablet Take 20 mg by mouth daily.    ? ?No current facility-administered medications  for this visit.  ? ? ?SUMMARY OF ONCOLOGIC HISTORY: ?Oncology History  ?CLL (chronic lymphocytic leukemia) ([place])  ?04/03/2008 Pathology Results  ? Case #: [id number] FLow cytomtry confirmed CLL ?  ?10/11/2014 Tumor Marker  ? FISH analysis showed trisomy 12 abnormality ?  ?04/29/2015 -  Chemotherapy  ? He started taking Ibrutinib ?  ?04/29/2015 Imaging  ? Ct scan showed mild lymphadenopathy and splenomegaly ?  ?05/13/2015 Adverse Reaction  ? Due to neutropenia, dose of Ibrutinib is reduced to 280 mg daily ?  ?01/08/2016 Imaging  ? Response to therapy. Resolution of thoracic and abdominal adenopathy. Resolution of splenomegaly. 2. No new or progressive disease. 3. Small bowel containing left inguinal hernia and right-sided hydrocele, as before. 4. Esophageal air fluid level suggests dysmotility or gastroesophageal reflux. 5.  Possible constipation. 6. Suspect hepatic hemangiomas, similar. ?  ?10/09/2016 Imaging  ? No findings suspicious for active lymphomatous involvement. ? ?No abdominopelvic lymphadenopathy.  Spleen is normal in size. ? ?Moderate to large left inguinal/scrotal hernia containing small bowel and fat. No evidence of bowel obstruction. ? ?Additional stable ancillary findings as above. ?  ?04/26/2017 Imaging  ? 1. No lymphadenopathy in the chest, abdomen, or pelvis. ?2. Mild circumferential bladder wall thickening. Incomplete distention may contribute to this appearance, but bladder infection is also consideration. ?3. Stable appearance small hepatic lesions. Imaging features most suggestive of benign cavernous hemangiomas. ?4. Stable tiny low-density renal lesions compatible with cysts. ?5. Large left groin hernia contains numerous small bowel loops without complicating features. ?6. Right-sided hydrocele. ?  ?11/15/2017 Imaging  ? Large recurrent left inguinal hernia with extension of multiple small bowel loops into the inguinal canal and scrotum on the left. Proximal small bowel dilatation is noted consistent with  a degree of incarceration. ?  ?Bilateral small effusions as well as patchy bibasilar infiltrates. ?  ?  ?12/14/2017 Adverse Reaction  ? Ibrutinib placed on hold ?  ?06/17/2020 Cancer Staging  ? Staging form: Chronic Lymphocytic Leukemia / Small Lymphocytic Lymphoma, AJCC 8th Edition ?- Clinical stage from 06/17/2020: Modified Rai Stage 0 (Modified Rai risk: Low, Lymphocytosis: Present, Adenopathy: Absent, Organomegaly: Absent, Anemia: Absent, Thrombocytopenia: Absent) - Signed by Heath Lark, MD on 06/17/2020 ?Stage prefix: Initial diagnosis ? ?  ? ? ?PHYSICAL EXAMINATION: ?ECOG PERFORMANCE STATUS: 2 - Symptomatic, <50% confined to bed ? ?Vitals:  ? 04/15/21 1022  ?BP: (!) 154/48  ?Pulse: 78  ?Resp: 18  ?Temp: 99 ?F (37.2 ?C)  ?SpO2: 100%  ? ?Filed Weights  ? 04/15/21 1022  ?Weight: 210 lb 6.4 oz (95.4 kg)  ? ? ?GENERAL:alert, no distress and comfortable ?SKIN: skin color, texture, turgor are normal, no rashes or significant lesions ?EYES: normal, Conjunctiva are pink and non-injected, sclera clear ?OROPHARYNX:no exudate, no erythema and lips, buccal mucosa, and tongue normal  ?NECK: supple, thyroid normal size, non-tender, without nodularity ?LYMPH:  no palpable lymphadenopathy in the cervical, axillary or inguinal ?LUNGS: clear to auscultation and percussion with normal breathing effort ?HEART: regular rate & rhythm and no murmurs with moderate lower extremity edema ?ABDOMEN:abdomen soft, non-tender and normal bowel sounds ?Musculoskeletal:no cyanosis of digits and no clubbing  ?NEURO: alert &  oriented x 3 with fluent speech, no focal motor/sensory deficits ? ?LABORATORY DATA:  ?I have reviewed the data as listed ?   ?Component Value Date/Time  ? NA 138 04/15/2021 1003  ? NA 140 10/09/2016 1106  ? K 4.5 04/15/2021 1003  ? K 4.7 10/09/2016 1106  ? CL 111 04/15/2021 1003  ? CL 104 03/28/2012 1426  ? CO2 20 (L) 04/15/2021 1003  ? CO2 28 10/09/2016 1106  ? GLUCOSE 161 (H) 04/15/2021 1003  ? GLUCOSE 127 10/09/2016 1106  ?  GLUCOSE 171 (H) 03/28/2012 1426  ? BUN 41 (H) 04/15/2021 1003  ? BUN 20.8 10/09/2016 1106  ? CREATININE 1.41 (H) 04/15/2021 1003  ? CREATININE 1.15 09/06/2018 1016  ? CREATININE 1.1 10/09/2016 1106  ? CAL

## 2021-04-15 NOTE — Assessment & Plan Note (Signed)
He has chronic bilateral lower extremity edema ?He will continue diuretic therapy as prescribed by his primary care doctor ?

## 2021-05-01 ENCOUNTER — Other Ambulatory Visit: Payer: Self-pay | Admitting: Hematology and Oncology

## 2021-05-01 ENCOUNTER — Other Ambulatory Visit (HOSPITAL_COMMUNITY): Payer: Self-pay

## 2021-05-01 MED ORDER — IBRUTINIB 280 MG PO TABS
ORAL_TABLET | ORAL | 11 refills | Status: DC
  Filled 2021-05-01: qty 28, 28d supply, fill #0
  Filled 2021-05-28: qty 28, 28d supply, fill #1
  Filled 2021-06-27: qty 28, 28d supply, fill #2
  Filled 2021-07-24: qty 28, 28d supply, fill #3
  Filled 2021-08-19: qty 28, 28d supply, fill #4
  Filled 2021-09-16: qty 28, 28d supply, fill #5
  Filled 2021-10-16: qty 28, 28d supply, fill #6
  Filled 2021-11-13: qty 28, 28d supply, fill #7

## 2021-05-02 ENCOUNTER — Other Ambulatory Visit (HOSPITAL_COMMUNITY): Payer: Self-pay

## 2021-05-07 ENCOUNTER — Other Ambulatory Visit (HOSPITAL_COMMUNITY): Payer: Self-pay

## 2021-05-12 DIAGNOSIS — G894 Chronic pain syndrome: Secondary | ICD-10-CM | POA: Diagnosis not present

## 2021-05-12 DIAGNOSIS — M5416 Radiculopathy, lumbar region: Secondary | ICD-10-CM | POA: Diagnosis not present

## 2021-05-12 DIAGNOSIS — Z79899 Other long term (current) drug therapy: Secondary | ICD-10-CM | POA: Diagnosis not present

## 2021-05-12 DIAGNOSIS — M5459 Other low back pain: Secondary | ICD-10-CM | POA: Diagnosis not present

## 2021-05-12 DIAGNOSIS — Z5181 Encounter for therapeutic drug level monitoring: Secondary | ICD-10-CM | POA: Diagnosis not present

## 2021-05-15 ENCOUNTER — Encounter (INDEPENDENT_AMBULATORY_CARE_PROVIDER_SITE_OTHER): Payer: Self-pay | Admitting: Ophthalmology

## 2021-05-15 ENCOUNTER — Ambulatory Visit (INDEPENDENT_AMBULATORY_CARE_PROVIDER_SITE_OTHER): Payer: Medicare HMO | Admitting: Ophthalmology

## 2021-05-15 DIAGNOSIS — E113551 Type 2 diabetes mellitus with stable proliferative diabetic retinopathy, right eye: Secondary | ICD-10-CM | POA: Diagnosis not present

## 2021-05-15 DIAGNOSIS — E113592 Type 2 diabetes mellitus with proliferative diabetic retinopathy without macular edema, left eye: Secondary | ICD-10-CM | POA: Diagnosis not present

## 2021-05-15 NOTE — Assessment & Plan Note (Signed)
PDR OD with temporal open region requiring completion of laser, will complete this in the coming weeks ?

## 2021-05-15 NOTE — Assessment & Plan Note (Signed)
OS similarly with small region temporally requiring completion of laser, will complete this in the coming months, and anteriorly in the nasal retina OS ?

## 2021-05-15 NOTE — Progress Notes (Signed)
? ? ?05/15/2021 ? ?  ? ?CHIEF COMPLAINT ?Patient presents for  ?Chief Complaint  ?Patient presents with  ? Diabetic Retinopathy without Macular Edema  ? ? ? ? ?HISTORY OF PRESENT ILLNESS: ?Michael [name] is a 88 y.o. male who presents to the clinic today for:  ? ?HPI   ?6 mos FU OU OCT FP. ?Pt stated vision is stable but claims that vision is worse at the moment due to allergies. ?Pt denies floaters and FOL. ? ? ?Last edited by Silvestre Moment on 05/15/2021 10:15 AM.  ?  ? ? ?Referring physician: ?Janie Morning, DO ?[address] ?[address] ?[place],  [place] [address] ? ?HISTORICAL INFORMATION:  ? ?Selected notes from the [facility] ?  ? ?Lab Results  ?Component Value Date  ? HGBA1C 7.0 (H) 11/04/2017  ?  ? ?CURRENT MEDICATIONS: ?Current Outpatient Medications (Ophthalmic Drugs)  ?Medication Sig  ? Polyethyl Glycol-Propyl Glycol (SYSTANE OP) Place 1 drop into both eyes 2 (two) times daily.  ? ?No current facility-administered medications for this visit. (Ophthalmic Drugs)  ? ?Current Outpatient Medications (Other)  ?Medication Sig  ? acetaminophen (TYLENOL) 650 MG CR tablet Take 1 tablet (650 mg total) by mouth 2 (two) times daily as needed for pain.  ? aspirin EC 81 MG tablet Take 81 mg by mouth daily.  ? Cholecalciferol (VITAMIN D3) 1000 units CAPS Take 1,000 Units by mouth 2 (two) times daily.   ? Cyanocobalamin (VITAMIN B 12 PO) Take 1,000 mcg by mouth daily.  ? ibrutinib (IMBRUVICA) 280 MG tablet TAKE 1 TABLET ('280MG'$ ) BY MOUTH DAILY.  ? insulin NPH-regular Human (70-30) 100 UNIT/ML injection Inject 35 Units into the skin 2 (two) times daily.  ? mirtazapine (REMERON) 15 MG tablet Take 1 tablet (15 mg total) by mouth at bedtime.  ? Multiple Vitamin (MULTIVITAMIN WITH MINERALS) TABS tablet Take 1 tablet by mouth daily.  ? pantoprazole (PROTONIX) 40 MG tablet Take 40 mg by mouth daily.  ? polyethylene glycol (MIRALAX / GLYCOLAX) packet Take 17 g by mouth daily.  ? ramipril (ALTACE) 5 MG capsule Take 5 mg by mouth  daily.  ? tamsulosin (FLOMAX) 0.4 MG CAPS capsule Take 0.4 mg by mouth daily.  ? torsemide (DEMADEX) 20 MG tablet Take 20 mg by mouth daily.  ? ?No current facility-administered medications for this visit. (Other)  ? ? ? ? ?REVIEW OF SYSTEMS: ?ROS   ?Negative for: Constitutional, Gastrointestinal, Neurological, Skin, Genitourinary, Musculoskeletal, HENT, Endocrine, Cardiovascular, Eyes, Respiratory, Psychiatric, Allergic/Imm, Heme/Lymph ?Last edited by Silvestre Moment on 05/15/2021 10:15 AM.  ?  ? ? ? ?ALLERGIES ?No Known Allergies ? ?PAST MEDICAL HISTORY ?Past Medical History:  ?Diagnosis Date  ? Anemia   ? Anginal pain ([place])   ? upon exertion  ? Arthritis   ? "left shoulder, lower back" (10/20/2016)  ? CLL (chronic lymphoblastic leukemia) dx'd 2000  ? Edema leg   ? Family history of adverse reaction to anesthesia   ? "daughter's BP drops"   ? GERD (gastroesophageal reflux disease)   ? History of hiatal hernia   ? Leukemia, chronic lymphoid ([facility]) 12/08/2010  ? Lymphocytosis   ? Type II diabetes mellitus ([place])   ? ?Past Surgical History:  ?Procedure Laterality Date  ? BOWEL RESECTION N/A 11/15/2017  ? Procedure: SMALL BOWEL RESECTION;  Surgeon: Coralie Keens, MD;  Location: [facility];  Service: General;  Laterality: N/A;  ? CATARACT EXTRACTION W/ INTRAOCULAR LENS  IMPLANT, BILATERAL Bilateral   ? EYE SURGERY    ?  Cataracts (bilateral)  ? GROIN DISSECTION Left 11/15/2017  ? Procedure: GROIN EXPLORATION WITH REMOVAL OF INGUINAL HERNIA MESH;  Surgeon: Coralie Keens, MD;  Location: [facility];  Service: General;  Laterality: Left;  ? INGUINAL HERNIA REPAIR Left 11/10/2017  ? Procedure: LEFT INGUINAL HERNIA REPAIR ERAS PATHWAY;  Surgeon: Coralie Keens, MD;  Location: [facility];  Service: General;  Laterality: Left;  ? INGUINAL HERNIA REPAIR Left 11/15/2017  ? Procedure: LEFT INGUINAL HERNIA REPAIR WITH MESH;  Surgeon: Coralie Keens, MD;  Location: [facility];  Service: General;  Laterality: Left;  ? INSERTION OF MESH Left 11/10/2017   ? Procedure: INSERTION OF MESH;  Surgeon: Coralie Keens, MD;  Location: [facility];  Service: General;  Laterality: Left;  ? ? ?FAMILY HISTORY ?Family History  ?Problem Relation Age of Onset  ? Cancer Mother   ? Hypertension Father   ? Diabetes Father   ? ? ?SOCIAL HISTORY ?Social History  ? ?Tobacco Use  ? Smoking status: Never  ? Smokeless tobacco: Former  ?  Types: Snuff  ?  Quit date: 2017  ?Vaping Use  ? Vaping Use: Never used  ?Substance Use Topics  ? Alcohol use: Not Currently  ? Drug use: No  ? ?  ? ?  ? ?OPHTHALMIC EXAM: ? ?Base Eye Exam   ? ? Visual Acuity (ETDRS)   ? ?   Right Left  ? Dist cc 20/20 -1 20/70 -1  ? Dist ph cc  NI  ? ? Correction: Glasses  ? ?  ?  ? ? Tonometry (Tonopen, 10:21 AM)   ? ?   Right Left  ? Pressure 12 10  ? ?  ?  ? ? Pupils   ? ?   Pupils Dark Light Shape React APD  ? Right PERRL 3 2 Round Brisk None  ? Left PERRL 3 2 Round Brisk None  ? ?  ?  ? ? Visual Fields   ? ?   Left Right  ?  Full Full  ? ?  ?  ? ? Extraocular Movement   ? ?   Right Left  ?  Full Full  ? ?  ?  ? ? Neuro/Psych   ? ? Oriented x3: Yes  ? Mood/Affect: Normal  ? ?  ?  ? ? Dilation   ? ? Both eyes: 1.0% Mydriacyl, 2.5% Phenylephrine @ 10:21 AM  ? ?  ?  ? ?  ? ?Slit Lamp and Fundus Exam   ? ? External Exam   ? ?   Right Left  ? External Normal Normal  ? ?  ?  ? ? Slit Lamp Exam   ? ?   Right Left  ? Lids/Lashes Normal Normal  ? Conjunctiva/Sclera White and quiet White and quiet  ? Cornea Clear Clear  ? Anterior Chamber Deep and quiet Deep and quiet  ? Iris Round and reactive Round and reactive  ? Lens Centered posterior chamber intraocular lens Centered posterior chamber intraocular lens  ? Anterior Vitreous Normal Normal  ? ?  ?  ? ? Fundus Exam   ? ?   Right Left  ? Posterior Vitreous Normal Posterior vitreous detachment, no change , no heme  ? Disc Normal Normal  ? C/D Ratio 0.5 0.5  ? Macula Microaneurysms, no clinically significant macular edema Microaneurysms  ? Vessels PDR-quiet PDR-quiet  ? Periphery  room temp for laser room sup for prp  ? ?  ?  ? ?  ? ? ?  IMAGING AND PROCEDURES  ?Imaging and Procedures for 05/15/21 ? ?OCT, Retina - OU - Both Eyes   ? ?   ?Right Eye ?Quality was good. Scan locations included subfoveal. Central Foveal Thickness: 328. Progression has been stable. Findings include normal foveal contour.  ? ?Left Eye ?Quality was good. Scan locations included subfoveal. Central Foveal Thickness: 335. Progression has been stable. Findings include normal foveal contour.  ? ?Notes ?No active maculopathy OU, will observe ? ?  ? ?Color Fundus Photography Optos - OU - Both Eyes   ? ?   ?Right Eye ?Disc findings include normal observations. Macula : microaneurysms.  ? ?Left Eye ?Progression has been stable. Disc findings include normal observations. Macula : microaneurysms.  ? ?Notes ?Good PRP nasal 270 degrees.  Open temporal window for more PRP OD in the 3 weeks ? 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 ? ? ?  ? ? ?  ?  ? ?  ?ASSESSMENT/PLAN: ? ?Stable treated proliferative diabetic retinopathy of right eye determined by examination associated with type 2 diabetes mellitus ([place]) ?PDR OD with temporal open region requiring completion of laser, will complete this in the coming weeks ? ?Proliferative diabetic retinopathy of left eye ([facility]) ?OS similarly with small region temporally requiring completion of laser, will complete this in the coming months, and anteriorly in the nasal retina OS  ? ?  ICD-10-CM   ?1. Proliferative diabetic retinopathy of left eye without macular edema associated with type 2 diabetes mellitus ([facility])  O96.2952 OCT, Retina - OU - Both Eyes  ?  Color Fundus Photography Optos - OU - Both Eyes  ?  ?2. Stable treated proliferative diabetic retinopathy of right eye determined by examination associated with type 2 diabetes mellitus ([facility])  W41.3244   ?  ? ? ?1.  OU with no reactivation of disease  yet in order to prevent progression of PDR in his future as ambulatory ability begins to wane, will complete temporal PRP OD next ? ?2.  OS will also need PRP anteriorly in the nasal retina as well as su

## 2021-05-28 ENCOUNTER — Other Ambulatory Visit (HOSPITAL_COMMUNITY): Payer: Self-pay

## 2021-06-03 ENCOUNTER — Other Ambulatory Visit (HOSPITAL_COMMUNITY): Payer: Self-pay

## 2021-06-03 ENCOUNTER — Telehealth: Payer: Self-pay

## 2021-06-03 NOTE — Telephone Encounter (Signed)
Oral Oncology Patient Advocate Encounter ? ?Was successful in securing patient a $8000 grant from [employer] to provide copayment coverage for [place].  This will keep the out of pocket expense at $0.   ?  ?[employer] ID: [id number] ? ?I have spoken with the patient. ?  ?The billing information is as follows and has been shared with [facility] ?  ?  ?RxBin: [id number] ?PCN: [id number] ?Member ID: [id number] ?Group ID: [id number] ?Dates of Eligibility: 05/04/21 through 05/04/22 ? ?Fund:  CLL ? ?Wynn Maudlin CPHT ?Specialty Pharmacy Patient Advocate ?[facility] ?Phone [phone] ?Fax [phone] ?06/03/2021 8:36 AM ?  ? ? ?

## 2021-06-03 NOTE — Telephone Encounter (Signed)
Oral Oncology Patient Advocate Encounter ?  ?Was successful in securing patient an $9 grant from [facility]) to provide copayment coverage for Imbruvica.  This will keep the out of pocket expense at $0.   ?  ?I have spoken with the patient.   ? ?The billing information is as follows and has been shared with [facility].  ? ?Member ID: [id number] ?Group ID: [id number] ?RxBin: [id number] ?Dates of Eligibility: 06/03/21 through 12/14/21 ? ?Fund:  CLL ? ?Wynn Maudlin CPHT ?Specialty Pharmacy Patient Advocate ?[facility] ?Phone [phone] ?Fax [phone] ?06/03/2021 8:52 AM ? ? ? ?

## 2021-06-05 ENCOUNTER — Encounter (INDEPENDENT_AMBULATORY_CARE_PROVIDER_SITE_OTHER): Payer: Self-pay | Admitting: Ophthalmology

## 2021-06-05 ENCOUNTER — Ambulatory Visit (INDEPENDENT_AMBULATORY_CARE_PROVIDER_SITE_OTHER): Payer: Medicare HMO | Admitting: Ophthalmology

## 2021-06-05 DIAGNOSIS — E113551 Type 2 diabetes mellitus with stable proliferative diabetic retinopathy, right eye: Secondary | ICD-10-CM

## 2021-06-05 DIAGNOSIS — E113592 Type 2 diabetes mellitus with proliferative diabetic retinopathy without macular edema, left eye: Secondary | ICD-10-CM

## 2021-06-05 NOTE — Assessment & Plan Note (Signed)
OS will need peripheral laser PRP temporally as well in the future ?

## 2021-06-05 NOTE — Progress Notes (Signed)
? ? ?06/05/2021 ? ?  ? ?CHIEF COMPLAINT ?Patient presents for  ?Chief Complaint  ?Patient presents with  ? Diabetic Retinopathy without Macular Edema  ? ? ? ? ?HISTORY OF PRESENT ILLNESS: ?Michael [name] is a 88 y.o. male who presents to the clinic today for:  ? ?HPI   ?3 weeks for DILATE OD, COLOR FP, PRP,,TEMPORAL. ?Pt stated vision has gotten a little worse since last visit. ?Pt denies floaters and FOL. ? ? ?Last edited by Silvestre Moment on 06/05/2021 10:28 AM.  ?  ? ? ?Referring physician: ?Janie Morning, DO ?[address] ?[address] ?[place],  [place] [address] ? ?HISTORICAL INFORMATION:  ? ?Selected notes from the [facility] ?  ? ?Lab Results  ?Component Value Date  ? HGBA1C 7.0 (H) 11/04/2017  ?  ? ?CURRENT MEDICATIONS: ?Current Outpatient Medications (Ophthalmic Drugs)  ?Medication Sig  ? Polyethyl Glycol-Propyl Glycol (SYSTANE OP) Place 1 drop into both eyes 2 (two) times daily.  ? ?No current facility-administered medications for this visit. (Ophthalmic Drugs)  ? ?Current Outpatient Medications (Other)  ?Medication Sig  ? acetaminophen (TYLENOL) 650 MG CR tablet Take 1 tablet (650 mg total) by mouth 2 (two) times daily as needed for pain.  ? aspirin EC 81 MG tablet Take 81 mg by mouth daily.  ? Cholecalciferol (VITAMIN D3) 1000 units CAPS Take 1,000 Units by mouth 2 (two) times daily.   ? Cyanocobalamin (VITAMIN B 12 PO) Take 1,000 mcg by mouth daily.  ? ibrutinib (IMBRUVICA) 280 MG tablet TAKE 1 TABLET ('280MG'$ ) BY MOUTH DAILY.  ? insulin NPH-regular Human (70-30) 100 UNIT/ML injection Inject 35 Units into the skin 2 (two) times daily.  ? mirtazapine (REMERON) 15 MG tablet Take 1 tablet (15 mg total) by mouth at bedtime.  ? Multiple Vitamin (MULTIVITAMIN WITH MINERALS) TABS tablet Take 1 tablet by mouth daily.  ? pantoprazole (PROTONIX) 40 MG tablet Take 40 mg by mouth daily.  ? polyethylene glycol (MIRALAX / GLYCOLAX) packet Take 17 g by mouth daily.  ? ramipril (ALTACE) 5 MG capsule Take 5 mg by mouth  daily.  ? tamsulosin (FLOMAX) 0.4 MG CAPS capsule Take 0.4 mg by mouth daily.  ? torsemide (DEMADEX) 20 MG tablet Take 20 mg by mouth daily.  ? ?No current facility-administered medications for this visit. (Other)  ? ? ? ? ?REVIEW OF SYSTEMS: ?ROS   ?Negative for: Constitutional, Gastrointestinal, Neurological, Skin, Genitourinary, Musculoskeletal, HENT, Endocrine, Cardiovascular, Eyes, Respiratory, Psychiatric, Allergic/Imm, Heme/Lymph ?Last edited by Silvestre Moment on 06/05/2021 10:28 AM.  ?  ? ? ? ?ALLERGIES ?No Known Allergies ? ?PAST MEDICAL HISTORY ?Past Medical History:  ?Diagnosis Date  ? Anemia   ? Anginal pain ([place])   ? upon exertion  ? Arthritis   ? "left shoulder, lower back" (10/20/2016)  ? CLL (chronic lymphoblastic leukemia) dx'd 2000  ? Edema leg   ? Family history of adverse reaction to anesthesia   ? "daughter's BP drops"   ? GERD (gastroesophageal reflux disease)   ? History of hiatal hernia   ? 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 Lymphocytosis   ? Type II diabetes mellitus ([place])   ? ?Past Surgical History:  ?Procedure Laterality Date  ? BOWEL RESECTION N/A 11/15/2017  ? Procedure: SMALL BOWEL RESECTION;  Surgeon: Coralie Keens, MD;  Location: [facility];  Service: General;  Laterality: N/A;  ? CATARACT EXTRACTION W/ INTRAOCULAR LENS  IMPLANT, BILATERAL Bilateral   ? EYE SURGERY    ? Cataracts (bilateral)  ?  GROIN DISSECTION Left 11/15/2017  ? Procedure: GROIN EXPLORATION WITH REMOVAL OF INGUINAL HERNIA MESH;  Surgeon: Coralie Keens, MD;  Location: [facility];  Service: General;  Laterality: Left;  ? INGUINAL HERNIA REPAIR Left 11/10/2017  ? Procedure: LEFT INGUINAL HERNIA REPAIR ERAS PATHWAY;  Surgeon: Coralie Keens, MD;  Location: [facility];  Service: General;  Laterality: Left;  ? INGUINAL HERNIA REPAIR Left 11/15/2017  ? Procedure: LEFT INGUINAL HERNIA REPAIR WITH MESH;  Surgeon: Coralie Keens, MD;  Location: [facility];  Service: General;  Laterality: Left;  ? INSERTION OF MESH Left 11/10/2017   ? Procedure: INSERTION OF MESH;  Surgeon: Coralie Keens, MD;  Location: [facility];  Service: General;  Laterality: Left;  ? ? ?FAMILY HISTORY ?Family History  ?Problem Relation Age of Onset  ? Cancer Mother   ? Hypertension Father   ? Diabetes Father   ? ? ?SOCIAL HISTORY ?Social History  ? ?Tobacco Use  ? Smoking status: Never  ? Smokeless tobacco: Former  ?  Types: Snuff  ?  Quit date: 2017  ?Vaping Use  ? Vaping Use: Never used  ?Substance Use Topics  ? Alcohol use: Not Currently  ? Drug use: No  ? ?  ? ?  ? ?OPHTHALMIC EXAM: ? ?Base Eye Exam   ? ? Visual Acuity (ETDRS)   ? ?   Right Left  ? Dist cc 20/20 -1 20/100 -2  ? Dist ph cc  20/80 +2  ? ? Correction: Glasses  ? ?  ?  ? ? Tonometry (Tonopen, 10:33 AM)   ? ?   Right Left  ? Pressure 15 13  ? ?  ?  ? ? Pupils   ? ?   Pupils APD  ? Right PERRL None  ? Left PERRL None  ? ?  ?  ? ? Visual Fields   ? ?   Left Right  ?  Full Full  ? ?  ?  ? ? Extraocular Movement   ? ?   Right Left  ?  Full Full  ? ?  ?  ? ? Neuro/Psych   ? ? Oriented x3: Yes  ? Mood/Affect: Normal  ? ?  ?  ? ? Dilation   ? ? Right eye: 2.5% Phenylephrine, 1.0% Mydriacyl @ 10:33 AM  ? ?  ?  ? ?  ? ?Slit Lamp and Fundus Exam   ? ? External Exam   ? ?   Right Left  ? External Normal Normal  ? ?  ?  ? ? Slit Lamp Exam   ? ?   Right Left  ? Lids/Lashes Normal Normal  ? Conjunctiva/Sclera White and quiet White and quiet  ? Cornea Clear Clear  ? Anterior Chamber Deep and quiet Deep and quiet  ? Iris Round and reactive Round and reactive  ? Lens Centered posterior chamber intraocular lens Centered posterior chamber intraocular lens  ? Anterior Vitreous Normal Normal  ? ?  ?  ? ? Fundus Exam   ? ?   Right Left  ? Posterior Vitreous Normal Posterior vitreous detachment, no change , no heme  ? Disc Normal Normal  ? C/D Ratio 0.5 0.5  ? Macula Microaneurysms, no clinically significant macular edema Microaneurysms  ? Vessels PDR-quiet, yet nonperfusion temporally PDR-quiet  ? Periphery room temp for laser  room sup for prp  ? ?  ?  ? ?  ? ? ?IMAGING AND PROCEDURES  ?Imaging and Procedures for 06/05/21 ? ?Color Fundus  Photography Optos - OU - Both Eyes   ? ?   ?Right Eye ?Disc findings include normal observations. Macula : microaneurysms.  ? ?Left Eye ?Progression has been stable. Disc findings include normal observations. Macula : microaneurysms.  ? ?Notes ?Good PRP nasal 270 degrees.  Open temporal window for more PRP OD today ? 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 ? ? ?  ? ?Panretinal Photocoagulation - OD - Right Eye   ? ?   ?Time Out ?Confirmed correct patient, procedure, site, and patient consented.  ? ?Anesthesia ?Topical anesthesia was used. Anesthetic medications included Proparacaine 0.5%.  ? ?Laser Information ?The type of laser was diode. Color was yellow. The duration in seconds was 0.02. The spot size was 390 microns. Laser power was 380. Total spots was 687.  ? ?Post-op ?The patient tolerated the procedure well. There were no complications. The patient received written and verbal post procedure care education.  ? ?Notes ?Patient PRP added temporally as well as partially inferiorly and anteriorly ? ?  ? ? ?  ?  ? ?  ?ASSESSMENT/PLAN: ? ?Stable treated proliferative diabetic retinopathy of right eye determined by examination associated with type 2 diabetes mellitus ([place]) ?OD, open area temporally of retinal nonperfusion.  Peripheral PRP delivered at that time prevent progression locally ? ?Proliferative diabetic retinopathy of left eye ([place]) ?OS will need peripheral laser PRP temporally as well in the future  ? ?  ICD-10-CM   ?1. Stable treated proliferative diabetic retinopathy of right eye determined by examination associated with type 2 diabetes mellitus ([place])  G83.6629 Color Fundus Photography Optos - OU - Both Eyes  ?  Panretinal Photocoagulation - OD - Right Eye  ?  ?2. Proliferative diabetic  retinopathy of left eye without macular edema associated with type 2 diabetes mellitus ([place])  U76.5465   ?  ? ? ?1.  OS will need peripheral PRP anterior temporally ? ?2.  OD, peripheral anterior PRP delivered tem

## 2021-06-05 NOTE — Assessment & Plan Note (Signed)
OD, open area temporally of retinal nonperfusion.  Peripheral PRP delivered at that time prevent progression locally ?

## 2021-06-09 DIAGNOSIS — I890 Lymphedema, not elsewhere classified: Secondary | ICD-10-CM | POA: Diagnosis not present

## 2021-06-09 DIAGNOSIS — E1142 Type 2 diabetes mellitus with diabetic polyneuropathy: Secondary | ICD-10-CM | POA: Diagnosis not present

## 2021-06-09 DIAGNOSIS — E113592 Type 2 diabetes mellitus with proliferative diabetic retinopathy without macular edema, left eye: Secondary | ICD-10-CM | POA: Diagnosis not present

## 2021-06-16 DIAGNOSIS — M48061 Spinal stenosis, lumbar region without neurogenic claudication: Secondary | ICD-10-CM | POA: Diagnosis not present

## 2021-06-16 DIAGNOSIS — I890 Lymphedema, not elsewhere classified: Secondary | ICD-10-CM | POA: Diagnosis not present

## 2021-06-16 DIAGNOSIS — I10 Essential (primary) hypertension: Secondary | ICD-10-CM | POA: Diagnosis not present

## 2021-06-16 DIAGNOSIS — N401 Enlarged prostate with lower urinary tract symptoms: Secondary | ICD-10-CM | POA: Diagnosis not present

## 2021-06-16 DIAGNOSIS — Z794 Long term (current) use of insulin: Secondary | ICD-10-CM | POA: Diagnosis not present

## 2021-06-16 DIAGNOSIS — E113592 Type 2 diabetes mellitus with proliferative diabetic retinopathy without macular edema, left eye: Secondary | ICD-10-CM | POA: Diagnosis not present

## 2021-06-16 DIAGNOSIS — E1142 Type 2 diabetes mellitus with diabetic polyneuropathy: Secondary | ICD-10-CM | POA: Diagnosis not present

## 2021-06-27 ENCOUNTER — Other Ambulatory Visit (HOSPITAL_COMMUNITY): Payer: Self-pay

## 2021-07-02 ENCOUNTER — Other Ambulatory Visit (HOSPITAL_COMMUNITY): Payer: Self-pay

## 2021-07-07 ENCOUNTER — Encounter (INDEPENDENT_AMBULATORY_CARE_PROVIDER_SITE_OTHER): Payer: Self-pay | Admitting: Ophthalmology

## 2021-07-07 ENCOUNTER — Encounter (INDEPENDENT_AMBULATORY_CARE_PROVIDER_SITE_OTHER): Payer: Medicare HMO | Admitting: Ophthalmology

## 2021-07-07 ENCOUNTER — Ambulatory Visit (INDEPENDENT_AMBULATORY_CARE_PROVIDER_SITE_OTHER): Payer: Medicare HMO | Admitting: Ophthalmology

## 2021-07-07 DIAGNOSIS — E113592 Type 2 diabetes mellitus with proliferative diabetic retinopathy without macular edema, left eye: Secondary | ICD-10-CM | POA: Diagnosis not present

## 2021-07-07 DIAGNOSIS — E113551 Type 2 diabetes mellitus with stable proliferative diabetic retinopathy, right eye: Secondary | ICD-10-CM

## 2021-07-07 NOTE — Assessment & Plan Note (Signed)
The nature of proliferative diabetic retinopathy was discussed with the patient as well as the potential for a vitreous hemorrhage and traction on the retina. Tight control of glucose, blood pressure, and serum lipids was recommended in order to slow further progression of disease. Cessation of smoking was recommended. Treatment options including ocular injectable medications, panretinal photocoagulation and/or vitrectomy surgery for advanced disease were discussed. Local medical therapy may slow or arrest progression of disease.  OD status post recent PRP looks great stable

## 2021-07-07 NOTE — Progress Notes (Signed)
07/07/2021     CHIEF COMPLAINT Patient presents for  Chief Complaint  Patient presents with   Diabetic Retinopathy without Macular Edema      HISTORY OF PRESENT ILLNESS: Michael [name] is a 88 y.o. male who presents to the clinic today for:   HPI   1 month for DILATE, OS, PRP,,,COLOR FP OU. Pt stated vision has been stable since last visit. Pt denies new floaters and FOL.   Last edited by Silvestre Moment on 07/07/2021  9:09 AM.      Referring physician: Janie Morning, DO [address] [place],  [place] [address]  HISTORICAL INFORMATION:   Selected notes from the MEDICAL RECORD NUMBER    Lab Results  Component Value Date   HGBA1C 7.0 (H) 11/04/2017     CURRENT MEDICATIONS: Current Outpatient Medications (Ophthalmic Drugs)  Medication Sig   Polyethyl Glycol-Propyl Glycol (SYSTANE OP) Place 1 drop into both eyes 2 (two) times daily.   No current facility-administered medications for this visit. (Ophthalmic Drugs)   Current Outpatient Medications (Other)  Medication Sig   acetaminophen (TYLENOL) 650 MG CR tablet Take 1 tablet (650 mg total) by mouth 2 (two) times daily as needed for pain.   aspirin EC 81 MG tablet Take 81 mg by mouth daily.   Cholecalciferol (VITAMIN D3) 1000 units CAPS Take 1,000 Units by mouth 2 (two) times daily.    Cyanocobalamin (VITAMIN B 12 PO) Take 1,000 mcg by mouth daily.   ibrutinib (IMBRUVICA) 280 MG tablet TAKE 1 TABLET ('280MG'$ ) BY MOUTH DAILY.   insulin NPH-regular Human (70-30) 100 UNIT/ML injection Inject 35 Units into the skin 2 (two) times daily.   mirtazapine (REMERON) 15 MG tablet Take 1 tablet (15 mg total) by mouth at bedtime.   Multiple Vitamin (MULTIVITAMIN WITH MINERALS) TABS tablet Take 1 tablet by mouth daily.   pantoprazole (PROTONIX) 40 MG tablet Take 40 mg by mouth daily.   polyethylene glycol (MIRALAX / GLYCOLAX) packet Take 17 g by mouth daily.   ramipril (ALTACE) 5 MG capsule Take 5 mg by mouth daily.    tamsulosin (FLOMAX) 0.4 MG CAPS capsule Take 0.4 mg by mouth daily.   torsemide (DEMADEX) 20 MG tablet Take 20 mg by mouth daily.   No current facility-administered medications for this visit. (Other)      REVIEW OF SYSTEMS: ROS   Negative for: Constitutional, Gastrointestinal, Neurological, Skin, Genitourinary, Musculoskeletal, HENT, Endocrine, Cardiovascular, Eyes, Respiratory, Psychiatric, Allergic/Imm, Heme/Lymph Last edited by Silvestre Moment on 07/07/2021  9:09 AM.       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   Lymphocytosis    Type II diabetes mellitus ([place])    Past Surgical History:  Procedure Laterality Date   BOWEL RESECTION N/A 11/15/2017   Procedure: SMALL BOWEL RESECTION;  Surgeon: Coralie Keens, MD;  Location: [facility];  Service: General;  Laterality: N/A;   CATARACT EXTRACTION W/ INTRAOCULAR LENS  IMPLANT, BILATERAL Bilateral    EYE SURGERY     Cataracts (bilateral)  GROIN DISSECTION Left 11/15/2017   Procedure: GROIN EXPLORATION WITH REMOVAL OF INGUINAL HERNIA MESH;  Surgeon: Coralie Keens, MD;  Location: [facility];  Service: General;  Laterality: Left;   INGUINAL HERNIA REPAIR Left 11/10/2017   Procedure: LEFT INGUINAL HERNIA REPAIR ERAS PATHWAY;  Surgeon: Coralie Keens, MD;  Location: [facility];  Service: General;  Laterality: Left;   INGUINAL HERNIA REPAIR Left 11/15/2017   Procedure: LEFT INGUINAL HERNIA REPAIR WITH MESH;  Surgeon: Coralie Keens, MD;  Location: [facility];  Service: General;  Laterality: Left;   INSERTION OF MESH Left 11/10/2017    Procedure: INSERTION OF MESH;  Surgeon: Coralie Keens, MD;  Location: [facility];  Service: General;  Laterality: Left;    FAMILY HISTORY Family History  Problem Relation Age of Onset   Cancer Mother    Hypertension Father    Diabetes Father     SOCIAL HISTORY Social History   Tobacco Use   Smoking status: Never   Smokeless tobacco: Former    Types: Snuff    Quit date: 2017  Vaping Use   Vaping Use: Never used  Substance Use Topics   Alcohol use: Not Currently   Drug use: No         OPHTHALMIC EXAM:  Base Eye Exam     Visual Acuity (ETDRS)       Right Left   Dist cc 20/25 -1 20/100   Dist ph cc  20/80 -2    Correction: Glasses         Tonometry (Tonopen, 9:14 AM)       Right Left   Pressure 12 13         Pupils       Pupils APD   Right PERRL None   Left PERRL None         Visual Fields       Left Right    Full Full         Extraocular Movement       Right Left    Full Full         Neuro/Psych     Oriented x3: Yes   Mood/Affect: Normal         Dilation     Left eye: 2.5% Phenylephrine, 1.0% Mydriacyl @ 9:14 AM           Slit Lamp and Fundus Exam     External Exam       Right Left   External Normal Normal         Slit Lamp Exam       Right Left   Lids/Lashes Normal Normal   Conjunctiva/Sclera White and quiet White and quiet   Cornea Clear Clear   Anterior Chamber Deep and quiet Deep and quiet   Iris Round and reactive Round and reactive   Lens Centered posterior chamber intraocular lens Centered posterior chamber intraocular lens   Anterior Vitreous Normal Normal         Fundus Exam       Right Left   Posterior Vitreous  Posterior vitreous detachment, no change , no heme   Disc  Normal   C/D Ratio  0.5   Macula  Microaneurysms   Vessels  PDR-quiet   Periphery  room sup for prp, and nasal            IMAGING AND PROCEDURES  Imaging and Procedures for 07/07/21  Color Fundus Photography  Optos - OU - Both Eyes  Right Eye Disc findings include normal observations. Macula : microaneurysms.   Left Eye Progression has been stable. Disc findings include normal observations. Macula : microaneurysms.   Notes Good PRP nasal 270 degrees.  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 We will need PRP temporally as well as anteriorly nasal        Panretinal Photocoagulation - OS - Left Eye       Time Out Confirmed correct patient, procedure, site, and patient consented.   Anesthesia Topical anesthesia was used. Anesthetic medications included Proparacaine 0.5%.   Laser Information The type of laser was diode. Color was yellow. The duration in seconds was 0.02. The spot size was 390 microns. Laser power was 260. Total spots was 570.   Post-op The patient tolerated the procedure well. There were no complications. The patient received written and verbal post procedure care education.   Notes PRP delivered anteriorly nasally, anteriorly inferiorly and temporally              ASSESSMENT/PLAN:  Stable treated proliferative diabetic retinopathy of right eye determined by examination associated with type 2 diabetes mellitus ([place]) The nature of proliferative diabetic retinopathy was discussed with the patient as well as the potential for a vitreous hemorrhage and traction on the retina. Tight control of glucose, blood pressure, and serum lipids was recommended in order to slow further progression of disease. Cessation of smoking was recommended. Treatment options including ocular injectable medications, panretinal photocoagulation and/or vitrectomy surgery for advanced disease were discussed. Local medical therapy may slow or arrest progression of disease.  OD status post recent PRP looks great stable  Proliferative diabetic retinopathy of left  eye (HCC) OS will need completion of PRP today to prevent progression of PDR     ICD-10-CM   1. Proliferative diabetic retinopathy of left eye without macular edema associated with type 2 diabetes mellitus (HCC)  D32.6712 Color Fundus Photography Optos - OU - Both Eyes    Panretinal Photocoagulation - OS - Left Eye    2. Stable treated proliferative diabetic retinopathy of right eye determined by examination associated with type 2 diabetes mellitus ([place])  W58.0998       1.  OD stable today.  2.  OS for completion of PRP to prevent progression of PDR  3.  Ophthalmic Meds Ordered this visit:  No orders of the defined types were placed in this encounter.      Return in about 6 months (around 01/06/2022) for DILATE OU, OCT, COLOR FP.  There are no Patient Instructions on file for this visit.   Explained the diagnoses, plan, and follow up with the patient and they expressed understanding.  Patient expressed understanding of the importance of proper follow up care.   Clent Demark Bronc Brosseau M.D. Diseases & Surgery of the Retina and Vitreous Retina & Diabetic [facility] 07/07/21     Abbreviations: M myopia (nearsighted); A astigmatism; H hyperopia (farsighted); P presbyopia; Mrx spectacle prescription;  CTL contact lenses; OD right eye; OS left eye; OU both eyes  XT exotropia; ET esotropia; PEK punctate epithelial keratitis; PEE punctate epithelial erosions; DES dry eye syndrome; MGD meibomian gland dysfunction; ATs artificial tears; PFAT's preservative free artificial tears; [place] nuclear sclerotic cataract; PSC posterior subcapsular cataract; ERM epi-retinal membrane; PVD posterior vitreous detachment; RD retinal detachment; DM diabetes mellitus; DR diabetic retinopathy; NPDR non-proliferative diabetic retinopathy; PDR proliferative  diabetic retinopathy; CSME clinically significant macular edema; DME diabetic macular edema; dbh dot blot hemorrhages; CWS cotton wool spot; POAG primary open  angle glaucoma; C/D cup-to-disc ratio; HVF humphrey visual field; GVF goldmann visual field; OCT optical coherence tomography; IOP intraocular pressure; BRVO Branch retinal vein occlusion; CRVO central retinal vein occlusion; CRAO central retinal artery occlusion; BRAO branch retinal artery occlusion; RT retinal tear; SB scleral buckle; PPV pars plana vitrectomy; VH Vitreous hemorrhage; PRP panretinal laser photocoagulation; IVK intravitreal kenalog; VMT vitreomacular traction; MH Macular hole;  NVD neovascularization of the disc; NVE neovascularization elsewhere; AREDS age related eye disease study; ARMD age related macular degeneration; POAG primary open angle glaucoma; EBMD epithelial/anterior basement membrane dystrophy; ACIOL anterior chamber intraocular lens; IOL intraocular lens; PCIOL posterior chamber intraocular lens; Phaco/IOL phacoemulsification with intraocular lens placement; [place] photorefractive keratectomy; LASIK laser assisted in situ keratomileusis; HTN hypertension; DM diabetes mellitus; COPD chronic obstructive pulmonary disease

## 2021-07-07 NOTE — Assessment & Plan Note (Signed)
OS will need completion of PRP today to prevent progression of PDR

## 2021-07-16 ENCOUNTER — Ambulatory Visit (INDEPENDENT_AMBULATORY_CARE_PROVIDER_SITE_OTHER): Payer: Medicare HMO | Admitting: Ophthalmology

## 2021-07-16 ENCOUNTER — Encounter (INDEPENDENT_AMBULATORY_CARE_PROVIDER_SITE_OTHER): Payer: Self-pay | Admitting: Ophthalmology

## 2021-07-16 ENCOUNTER — Encounter (INDEPENDENT_AMBULATORY_CARE_PROVIDER_SITE_OTHER): Payer: Medicare HMO | Admitting: Ophthalmology

## 2021-07-16 DIAGNOSIS — E113551 Type 2 diabetes mellitus with stable proliferative diabetic retinopathy, right eye: Secondary | ICD-10-CM | POA: Diagnosis not present

## 2021-07-16 DIAGNOSIS — H3561 Retinal hemorrhage, right eye: Secondary | ICD-10-CM | POA: Diagnosis not present

## 2021-07-16 DIAGNOSIS — E113592 Type 2 diabetes mellitus with proliferative diabetic retinopathy without macular edema, left eye: Secondary | ICD-10-CM

## 2021-07-16 NOTE — Progress Notes (Signed)
07/16/2021     CHIEF COMPLAINT Patient presents for  Chief Complaint  Patient presents with   Eye Problem   Diabetic Retinopathy without Macular Edema      HISTORY OF PRESENT ILLNESS: Michael [name] is a 88 y.o. male who presents to the clinic today for:   HPI   WIP- Blurriness of Vision OD. Patient states "I woke up Friday morning and noticed red splotches/ floaters in my right eye. My other eye seems like it is trying to act up now. My vision was never completely clear but it is fuzzy. I can't see fine print with my glasses on. There is like a film over my eye."  Patient states "I use prescription eye drops that are over the counter every day, once in the morning and once at night." Patient denies pain, patient denies new FOL. Last edited by Laurin Coder on 07/16/2021 10:19 AM.      Referring physician: Janie Morning, DO [address] [place],  [place] [address]  HISTORICAL INFORMATION:   Selected notes from the MEDICAL RECORD NUMBER    Lab Results  Component Value Date   HGBA1C 7.0 (H) 11/04/2017     CURRENT MEDICATIONS: Current Outpatient Medications (Ophthalmic Drugs)  Medication Sig   Polyethyl Glycol-Propyl Glycol (SYSTANE OP) Place 1 drop into both eyes 2 (two) times daily.   No current facility-administered medications for this visit. (Ophthalmic Drugs)   Current Outpatient Medications (Other)  Medication Sig   acetaminophen (TYLENOL) 650 MG CR tablet Take 1 tablet (650 mg total) by mouth 2 (two) times daily as needed for pain.   aspirin EC 81 MG tablet Take 81 mg by mouth daily.   Cholecalciferol (VITAMIN D3) 1000 units CAPS Take 1,000 Units by mouth 2 (two) times daily.    Cyanocobalamin (VITAMIN B 12 PO) Take 1,000 mcg by mouth daily.   ibrutinib (IMBRUVICA) 280 MG tablet TAKE 1 TABLET (280MG) BY MOUTH DAILY.   insulin NPH-regular Human (70-30) 100 UNIT/ML injection Inject 35 Units into the skin 2 (two) times daily.   mirtazapine  (REMERON) 15 MG tablet Take 1 tablet (15 mg total) by mouth at bedtime.   Multiple Vitamin (MULTIVITAMIN WITH MINERALS) TABS tablet Take 1 tablet by mouth daily.   pantoprazole (PROTONIX) 40 MG tablet Take 40 mg by mouth daily.   polyethylene glycol (MIRALAX / GLYCOLAX) packet Take 17 g by mouth daily.   ramipril (ALTACE) 5 MG capsule Take 5 mg by mouth daily.   tamsulosin (FLOMAX) 0.4 MG CAPS capsule Take 0.4 mg by mouth daily.   torsemide (DEMADEX) 20 MG tablet Take 20 mg by mouth daily.   No current facility-administered medications for this visit. (Other)      REVIEW OF SYSTEMS: ROS   Negative for: Constitutional, Gastrointestinal, Neurological, Skin, Genitourinary, Musculoskeletal, HENT, Endocrine, Cardiovascular, Eyes, Respiratory, Psychiatric, Allergic/Imm, Heme/Lymph Last edited by Hurman Horn, MD on 07/16/2021 10:53 AM.       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  Lymphocytosis    Type II diabetes mellitus ([place])    Past Surgical History:  Procedure Laterality Date   BOWEL RESECTION N/A 11/15/2017   Procedure: SMALL BOWEL RESECTION;  Surgeon: Coralie Keens, MD;  Location: [facility];  Service: General;  Laterality: N/A;   CATARACT EXTRACTION W/ INTRAOCULAR LENS  IMPLANT, BILATERAL Bilateral    EYE SURGERY     Cataracts (bilateral)   GROIN DISSECTION Left 11/15/2017   Procedure: GROIN EXPLORATION WITH REMOVAL OF INGUINAL HERNIA MESH;  Surgeon: Coralie Keens, MD;  Location: [facility];  Service: General;  Laterality: Left;   INGUINAL HERNIA REPAIR Left  11/10/2017   Procedure: LEFT INGUINAL HERNIA REPAIR ERAS PATHWAY;  Surgeon: Coralie Keens, MD;  Location: [facility];  Service: General;  Laterality: Left;   INGUINAL HERNIA REPAIR Left 11/15/2017   Procedure: LEFT INGUINAL HERNIA REPAIR WITH MESH;  Surgeon: Coralie Keens, MD;  Location: [facility];  Service: General;  Laterality: Left;   INSERTION OF MESH Left 11/10/2017   Procedure: INSERTION OF MESH;  Surgeon: Coralie Keens, MD;  [facility];  Service: General;  Laterality: Left;    FAMILY HISTORY Family History  Problem Relation Age of Onset   Cancer Mother    Hypertension Father    Diabetes Father     SOCIAL HISTORY Social History   Tobacco Use   Smoking status: Never   Smokeless tobacco: Former    Types: Snuff    Quit date: 2017  Vaping Use   Vaping Use: Never used  Substance Use Topics   Alcohol use: Not Currently   Drug use: No         OPHTHALMIC EXAM:  Base Eye Exam     Visual Acuity (ETDRS)       Right Left   Dist cc 20/100 -2 20/80 -1   Dist ph cc NI NI    Correction: Glasses         Tonometry (Tonopen, 10:23 AM)       Right Left   Pressure 13 13         Pupils       Dark Light Shape APD   Right 4 3  None   Left   Irregular None         Visual Fields (Counting fingers)       Left Right    Full Full         Extraocular Movement       Right Left    Full Full         Neuro/Psych     Oriented x3: Yes   Mood/Affect: Normal         Dilation     Both eyes: 1.0% Mydriacyl, 2.5% Phenylephrine @ 10:23 AM           Slit Lamp and Fundus Exam     External Exam       Right Left   External Normal Normal         Slit Lamp Exam       Right Left   Lids/Lashes Normal Normal   Conjunctiva/Sclera White and quiet White and quiet   Cornea Clear Clear   Anterior Chamber Deep and quiet Deep and quiet   Iris Round and reactive Round and reactive   Lens Centered posterior chamber intraocular lens Centered  posterior chamber intraocular lens   Anterior Vitreous Normal Normal         Fundus Exam       Right  Left   Posterior Vitreous Normal Posterior vitreous detachment, no change , no heme   Disc Normal Normal   C/D Ratio 0.5 0.5   Macula Microaneurysms, no clinically significant macular edema Microaneurysms   Vessels PDR-quiet, yet nonperfusion temporally PDR-quiet   Periphery room temp for laser room sup for prp            IMAGING AND PROCEDURES  Imaging and Procedures for 07/16/21  OCT, Retina - OU - Both Eyes       Right Eye Quality was good. Scan locations included subfoveal. Central Foveal Thickness: 465. Progression has been stable. Findings include normal foveal contour.   Left Eye Quality was good. Scan locations included subfoveal. Central Foveal Thickness: 330. Progression has been stable. Findings include normal foveal contour.   Notes Dark shadowing from apparent subinternal limiting membrane hemorrhaging in the macular and prefoveal region no active CME, OD      Color Fundus Photography Optos - OU - Both Eyes       Right Eye Disc findings include normal observations. Macula : microaneurysms.   Left Eye Progression has been stable. Disc findings include normal observations. Macula : microaneurysms.   Notes Good PRP nasal 270 degrees.  Good PRP OD with room temporal to macula.  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 We will need PRP temporally as well as anteriorly nasal                ASSESSMENT/PLAN:  Superficial retinal hemorrhage of right eye Prefoveal Sub internal limiting membrane hemorrhage.  Patient denies any type of Valsalva maneuvering thus no constipation thus no heavy lifting straining that may have triggered this.  Most likely  this is simply from episodic hypertension leading to a spontaneous hemorrhage which covers the center of the vision.  I explained the patient that this can be observed but typically takes 4 to 6 months to clear.  However surgical intervention can remove this hemorrhage with an excellent risk-benefit ratio for recovery of vision soon thereafter.  Via vitrectomy, ILM peel evacuation of hemorrhage additionally PRP for the PDR  Stable treated proliferative diabetic retinopathy of right eye determined by examination associated with type 2 diabetes mellitus (Michael [name]) Room temporally for completion of PRP     ICD-10-CM   1. Superficial retinal hemorrhage of right eye  H35.61 OCT, Retina - OU - Both Eyes    Color Fundus Photography Optos - OU - Both Eyes    2. Proliferative diabetic retinopathy of left eye without macular edema associated with type 2 diabetes mellitus (HCC)  B16.9450 Color Fundus Photography Optos - OU - Both Eyes    3. Stable treated proliferative diabetic retinopathy of right eye determined by examination associated with type 2 diabetes mellitus (HCC)  T88.8280 Color Fundus Photography Optos - OU - Both Eyes      1.  Extensive details regarding the nature of the occurrence of the hemorrhage in this right eye and the typical causes reviewed with the patient in addition the typical approach surgically to fix this so that he regains his vision in his best sided eye.  2.  Spontaneous subinternal limiting membrane hemorrhage right eye in the prefoveal region.  3.  Schedule vitrectomy ILM peel OD with additional PRP OD under local MAC in the coming weeks  Ophthalmic Meds  Ordered this visit:  No orders of the defined types were placed in this encounter.      Return , [facility], for Will need to schedule vitrectomy, membrane [phone] with laser, OD.  There are no Patient Instructions on file for this visit.   Explained the diagnoses, plan, and follow up with the  patient and they expressed understanding.  Patient expressed understanding of the importance of proper follow up care.   Clent Demark Renardo Cheatum M.D. Diseases & Surgery of the Retina and Vitreous [facility]ina [facility] Diabetic [place] 07/16/21     Abbreviations: M myopia (nearsighted); A astigmatism; H hyperopia (farsighted); P presbyopia; Mrx spectacle prescription;  CTL contact lenses; OD right eye; OS left eye; OU both eyes  XT exotropia; ET esotropia; PEK punctate epithelial keratitis; PEE punctate epithelial erosions; DES dry eye syndrome; MGD meibomian gland dysfunction; ATs artificial tears; PFAT's preservative free artificial tears; [place] nuclear sclerotic cataract; PSC posterior subcapsular cataract; ERM epi-retinal membrane; PVD posterior vitreous detachment; RD retinal detachment; DM diabetes mellitus; DR diabetic retinopathy; NPDR non-proliferative diabetic retinopathy; PDR proliferative diabetic retinopathy; CSME clinically significant macular edema; DME diabetic macular edema; dbh dot blot hemorrhages; CWS cotton wool spot; POAG primary open angle glaucoma; C/D cup-to-disc ratio; HVF humphrey visual field; GVF goldmann visual field; OCT optical coherence tomography; IOP intraocular pressure; BRVO Branch retinal vein occlusion; CRVO central retinal vein occlusion; CRAO central retinal artery occlusion; BRAO branch retinal artery occlusion; RT retinal tear; SB scleral buckle; PPV pars plana vitrectomy; VH Vitreous hemorrhage; PRP panretinal laser photocoagulation; IVK intravitreal kenalog; VMT vitreomacular traction; MH Macular hole;  NVD neovascularization of the disc; NVE neovascularization elsewhere; AREDS age related eye disease study; ARMD age related macular degeneration; POAG primary open angle glaucoma; EBMD epithelial/anterior basement membrane dystrophy; ACIOL anterior chamber intraocular lens; IOL intraocular lens; PCIOL posterior chamber intraocular lens; Phaco/IOL phacoemulsification with intraocular  lens placement; [place] photorefractive keratectomy; LASIK laser assisted in situ keratomileusis; HTN hypertension; DM diabetes mellitus; COPD chronic obstructive pulmonary disease

## 2021-07-16 NOTE — Assessment & Plan Note (Signed)
Room temporally for completion of PRP

## 2021-07-16 NOTE — Assessment & Plan Note (Signed)
Prefoveal Sub internal limiting membrane hemorrhage.  Patient denies any type of Valsalva maneuvering thus no constipation thus no heavy lifting straining that may have triggered this.  Most likely this is simply from episodic hypertension leading to a spontaneous hemorrhage which covers the center of the vision.  I explained the patient that this can be observed but typically takes 4 to 6 months to clear.  However surgical intervention can remove this hemorrhage with an excellent risk-benefit ratio for recovery of vision soon thereafter.  Via vitrectomy, ILM peel evacuation of hemorrhage additionally PRP for the PDR

## 2021-07-22 ENCOUNTER — Encounter (INDEPENDENT_AMBULATORY_CARE_PROVIDER_SITE_OTHER): Payer: Self-pay

## 2021-07-22 ENCOUNTER — Ambulatory Visit (INDEPENDENT_AMBULATORY_CARE_PROVIDER_SITE_OTHER): Payer: Medicare HMO

## 2021-07-22 MED ORDER — OFLOXACIN 0.3 % OP SOLN: 1.0000 [drp] | Freq: Four times a day (QID) | OPHTHALMIC | 0 refills | Status: DC

## 2021-07-22 MED ORDER — PREDNISOLONE ACETATE 1 % OP SUSP: 1.0000 [drp] | Freq: Four times a day (QID) | OPHTHALMIC | 0 refills | Status: AC

## 2021-07-22 NOTE — Progress Notes (Signed)
07/22/2021     CHIEF COMPLAINT Patient presents for Pre-op Exam   HISTORY OF PRESENT ILLNESS: Michael [name] is a 88 y.o. male who presents to the clinic today for:   HPI   Pre Op OD Sx 07/30/2021. Patient states vision is stable and unchanged since last visit. Denies any new floaters or FOL.  Last edited by Nelva Nay on 07/22/2021 10:51 AM.        HISTORICAL INFORMATION:   Selected notes from the MEDICAL RECORD NUMBER    Lab Results  Component Value Date   HGBA1C 7.0 (H) 11/04/2017     CURRENT MEDICATIONS: Current Outpatient Medications (Ophthalmic Drugs)  Medication Sig   Polyethyl Glycol-Propyl Glycol (SYSTANE OP) Place 1 drop into both eyes 2 (two) times daily.   No current facility-administered medications for this visit. (Ophthalmic Drugs)   Current Outpatient Medications (Other)  Medication Sig   acetaminophen (TYLENOL) 650 MG CR tablet Take 1 tablet (650 mg total) by mouth 2 (two) times daily as needed for pain.   aspirin EC 81 MG tablet Take 81 mg by mouth daily.   Cholecalciferol (VITAMIN D3) 1000 units CAPS Take 1,000 Units by mouth 2 (two) times daily.    Cyanocobalamin (VITAMIN B 12 PO) Take 1,000 mcg by mouth daily.   ibrutinib (IMBRUVICA) 280 MG tablet TAKE 1 TABLET (280MG ) BY MOUTH DAILY.   insulin NPH-regular Human (70-30) 100 UNIT/ML injection Inject 35 Units into the skin 2 (two) times daily.   mirtazapine (REMERON) 15 MG tablet Take 1 tablet (15 mg total) by mouth at bedtime.   Multiple Vitamin (MULTIVITAMIN WITH MINERALS) TABS tablet Take 1 tablet by mouth daily.   pantoprazole (PROTONIX) 40 MG tablet Take 40 mg by mouth daily.   polyethylene glycol (MIRALAX / GLYCOLAX) packet Take 17 g by mouth daily.   ramipril (ALTACE) 5 MG capsule Take 5 mg by mouth daily.   tamsulosin (FLOMAX) 0.4 MG CAPS capsule Take 0.4 mg by mouth daily.   torsemide (DEMADEX) 20 MG tablet Take 20 mg by mouth daily.   No current facility-administered medications  for this visit. (Other)     ALLERGIES No Known Allergies  PAST MEDICAL HISTORY Past Medical History:  Diagnosis Date   Anemia    Anginal pain ([facility])    upon exertion   Arthritis    "left shoulder, lower back" (10/20/2016)   CLL (chronic lymphoblastic leukemia) dx'd 2000   Edema leg    Family history of adverse reaction to anesthesia    "daughter's BP drops"    GERD (gastroesophageal reflux disease)    History of hiatal hernia    Leukemia, chronic lymphoid ([facility]) 12/08/2010   Lymphocytosis    Type II diabetes mellitus ([facility])    Past Surgical History:  Procedure Laterality Date   BOWEL RESECTION N/A 11/15/2017   Procedure: SMALL BOWEL RESECTION;  Surgeon: Abigail Miyamoto, MD;  [facility];  Service: General;  Laterality: N/A;   CATARACT EXTRACTION W/ INTRAOCULAR LENS  IMPLANT, BILATERAL Bilateral    EYE SURGERY     Cataracts (bilateral)   GROIN DISSECTION Left 11/15/2017   Procedure: GROIN EXPLORATION WITH REMOVAL OF INGUINAL HERNIA MESH;  Surgeon: Abigail Miyamoto, MD;  [facility];  Service: General;  Laterality: Left;   INGUINAL HERNIA REPAIR Left 11/10/2017   Procedure: LEFT INGUINAL HERNIA REPAIR ERAS PATHWAY;  Surgeon: Abigail Miyamoto, MD;  [facility];  Service: General;  Laterality: Left;   INGUINAL HERNIA REPAIR Left 11/15/2017  Procedure: LEFT INGUINAL HERNIA REPAIR WITH MESH;  Surgeon: Abigail Miyamoto, MD;  [facility];  Service: General;  Laterality: Left;   INSERTION OF MESH Left 11/10/2017   Procedure: INSERTION OF MESH;  Surgeon: Abigail Miyamoto, MD;  [facility];  Service: General;  Laterality: Left;    FAMILY HISTORY Family History  Problem Relation Age of Onset   Cancer Mother    Hypertension Father    Diabetes Father     SOCIAL HISTORY Social History   Tobacco Use   Smoking status: Never   Smokeless tobacco: Former    Types: Snuff    Quit date: 2017  Vaping Use   Vaping Use: Never used  Substance Use Topics    Alcohol use: Not Currently   Drug use: No         OPHTHALMIC EXAM:  Base Eye Exam     Visual Acuity (ETDRS)       Right Left   Dist cc 20/100 -2 20/60 -1   Dist ph cc NI     Correction: Glasses         Tonometry (Tonopen, 10:55 AM)       Right Left   Pressure 12 10         Pupils       Pupils APD   Right PERRL None   Left PERRL None         Extraocular Movement       Right Left    Full Full         Neuro/Psych     Oriented x3: Yes   Mood/Affect: Normal         Dilation     Both eyes: No dilation             IMAGING AND PROCEDURES  Imaging and Procedures for @TODAY @           ASSESSMENT/PLAN:  No diagnosis found.  Ophthalmic Meds Ordered this visit:  No orders of the defined types were placed in this encounter.       Pre-op completed. Operative consent obtained with pre-op eye drops reviewed with [name] and sent via [facility] as needed. Post op instructions reviewed with patient and per patient all questions answered.  Nelva Nay

## 2021-07-23 ENCOUNTER — Other Ambulatory Visit (HOSPITAL_COMMUNITY): Payer: Self-pay

## 2021-07-23 ENCOUNTER — Other Ambulatory Visit (INDEPENDENT_AMBULATORY_CARE_PROVIDER_SITE_OTHER): Payer: Medicare HMO | Admitting: Ophthalmology

## 2021-07-23 MED ORDER — MOXIFLOXACIN HCL 0.5 % OP SOLN
1.0000 [drp] | Freq: Three times a day (TID) | OPHTHALMIC | 0 refills | Status: AC
  Filled 2021-07-23: qty 3, 20d supply, fill #0

## 2021-07-23 NOTE — Progress Notes (Signed)
OFLOXACIN NOT AVAILABLE PER  PHARMACY

## 2021-07-24 ENCOUNTER — Other Ambulatory Visit (HOSPITAL_COMMUNITY): Payer: Self-pay

## 2021-07-28 ENCOUNTER — Other Ambulatory Visit (HOSPITAL_COMMUNITY): Payer: Self-pay

## 2021-07-30 ENCOUNTER — Encounter (AMBULATORY_SURGERY_CENTER): Payer: Medicare HMO | Admitting: Ophthalmology

## 2021-07-30 DIAGNOSIS — H3561 Retinal hemorrhage, right eye: Secondary | ICD-10-CM | POA: Diagnosis not present

## 2021-07-30 DIAGNOSIS — H35371 Puckering of macula, right eye: Secondary | ICD-10-CM | POA: Diagnosis not present

## 2021-07-31 ENCOUNTER — Encounter (INDEPENDENT_AMBULATORY_CARE_PROVIDER_SITE_OTHER): Payer: Self-pay | Admitting: Ophthalmology

## 2021-07-31 ENCOUNTER — Ambulatory Visit (INDEPENDENT_AMBULATORY_CARE_PROVIDER_SITE_OTHER): Payer: Medicare HMO | Admitting: Ophthalmology

## 2021-07-31 DIAGNOSIS — H3561 Retinal hemorrhage, right eye: Secondary | ICD-10-CM

## 2021-07-31 DIAGNOSIS — E113551 Type 2 diabetes mellitus with stable proliferative diabetic retinopathy, right eye: Secondary | ICD-10-CM

## 2021-07-31 NOTE — Patient Instructions (Signed)

## 2021-07-31 NOTE — Progress Notes (Signed)
07/31/2021     CHIEF COMPLAINT Patient presents for  Chief Complaint  Patient presents with   Post-op Follow-up      HISTORY OF PRESENT ILLNESS: Michael [name] is a 88 y.o. 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      Referring physician: Janie Morning, DO [address] [place],  [place] [address]  HISTORICAL INFORMATION:   Selected notes from the MEDICAL RECORD NUMBER    Lab Results  Component Value Date   HGBA1C 7.0 (H) 11/04/2017     CURRENT MEDICATIONS: Current Outpatient Medications (Ophthalmic Drugs)  Medication Sig   moxifloxacin (VIGAMOX) 0.5 % ophthalmic solution Place 1 drop into the right eye 3 (three) times daily for 20 days.   Polyethyl Glycol-Propyl Glycol (SYSTANE OP) Place 1 drop into both eyes 2 (two) times daily.   No current facility-administered medications for this visit. (Ophthalmic Drugs)   Current Outpatient Medications (Other)  Medication Sig   acetaminophen (TYLENOL) 650 MG CR tablet Take 1 tablet (650 mg total) by mouth 2 (two) times daily as needed for pain.   aspirin EC 81 MG tablet Take 81 mg by mouth daily.   Cholecalciferol (VITAMIN D3) 1000 units CAPS Take 1,000 Units by mouth 2 (two) times daily.    Cyanocobalamin (VITAMIN B 12 PO) Take 1,000 mcg by mouth daily.   ibrutinib (IMBRUVICA) 280 MG tablet TAKE 1 TABLET ('280MG'$ ) BY MOUTH DAILY.   insulin NPH-regular Human (70-30) 100 UNIT/ML injection Inject 35 Units into the skin 2 (two) times daily.   mirtazapine (REMERON) 15 MG tablet Take 1 tablet (15 mg total) by mouth at bedtime.   Multiple Vitamin (MULTIVITAMIN WITH MINERALS) TABS tablet Take 1 tablet by mouth daily.   pantoprazole (PROTONIX) 40 MG tablet Take 40 mg by mouth daily.   polyethylene glycol (MIRALAX / GLYCOLAX) packet Take  17 g by mouth daily.   ramipril (ALTACE) 5 MG capsule Take 5 mg by mouth daily.   tamsulosin (FLOMAX) 0.4 MG CAPS capsule Take 0.4 mg by mouth daily.   torsemide (DEMADEX) 20 MG tablet Take 20 mg by mouth daily.   No current facility-administered medications for this visit. (Other)      REVIEW OF SYSTEMS: ROS   Negative for: Constitutional, Gastrointestinal, Neurological, Skin, Genitourinary, Musculoskeletal, HENT, Endocrine, Cardiovascular, Eyes, Respiratory, Psychiatric, Allergic/Imm, Heme/Lymph Last edited by Hurman Horn, MD on 07/31/2021  8:43 AM.       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   Lymphocytosis    Type II diabetes mellitus ([place])    Past Surgical History:  Procedure Laterality Date   BOWEL RESECTION N/A 11/15/2017   Procedure: SMALL BOWEL RESECTION;  Surgeon: Coralie Keens, MD;  Location: [facility];  Service: General;  Laterality: N/A;   CATARACT EXTRACTION W/ INTRAOCULAR LENS  IMPLANT, BILATERAL  Bilateral    EYE SURGERY     Cataracts (bilateral)   GROIN DISSECTION Left 11/15/2017   Procedure: GROIN EXPLORATION WITH REMOVAL OF INGUINAL HERNIA MESH;  Surgeon: Coralie Keens, MD;  Location: [facility];  Service: General;  Laterality: Left;   INGUINAL HERNIA REPAIR Left 11/10/2017   Procedure: LEFT INGUINAL HERNIA REPAIR ERAS PATHWAY;  Surgeon: Coralie Keens, MD;  Location: [facility];  Service: General;  Laterality: Left;   INGUINAL HERNIA REPAIR Left 11/15/2017   Procedure: LEFT INGUINAL HERNIA REPAIR WITH MESH;  Surgeon: Coralie Keens, MD;   Location: [facility];  Service: General;  Laterality: Left;   INSERTION OF MESH Left 11/10/2017   Procedure: INSERTION OF MESH;  Surgeon: Coralie Keens, MD;  Location: [facility];  Service: General;  Laterality: Left;    FAMILY HISTORY Family History  Problem Relation Age of Onset   Cancer Mother    Hypertension Father    Diabetes Father     SOCIAL HISTORY Social History   Tobacco Use   Smoking status: Never   Smokeless tobacco: Former    Types: Snuff    Quit date: 2017  Vaping Use   Vaping Use: Never used  Substance Use Topics   Alcohol use: Not Currently   Drug use: No         OPHTHALMIC EXAM:  Base Eye Exam     Visual Acuity (ETDRS)       Right Left   Dist [place] 20/100 20/50 -2         Tonometry (Tonopen, 8:47 AM)       Right Left   Pressure 8 11         Pupils       Pupils APD   Right PERRL None   Left PERRL          Visual Fields       Left Right    Full Full         Extraocular Movement       Right Left    Full, Ortho Full, Ortho         Neuro/Psych     Oriented x3: Yes   Mood/Affect: Normal         Dilation     Right eye: 1.0% Mydriacyl, 2.5% Phenylephrine @ 8:47 AM           Slit Lamp and Fundus Exam     External Exam       Right Left   External Normal Normal         Slit Lamp Exam       Right Left   Lids/Lashes Normal Normal   Conjunctiva/Sclera White and quiet White and quiet   Cornea Clear Clear   Anterior Chamber Deep and quiet Deep and quiet   Iris Round and reactive Round and reactive   Lens Centered posterior chamber intraocular lens Centered posterior chamber intraocular lens   Anterior Vitreous Normal Normal         Fundus Exam       Right Left   Posterior Vitreous Normal    Disc Normal    C/D Ratio 0.5    Macula Post ilm removal changes, much less prefoveal hemorrhage    Vessels PDR-quiet, yet nonperfusion temporally    Periphery room temp for laser             IMAGING AND  PROCEDURES  Imaging and Procedures for 07/31/21  ASSESSMENT/PLAN:  Superficial retinal hemorrhage of right eye Prefoveal subtle internal limiting membrane hemorrhage, in the setting of diabetic retinopathy.  Vitrectomy ILM peel 07-30-2021  Stable treated proliferative diabetic retinopathy of right eye determined by examination associated with type 2 diabetes mellitus ([place]) PDR quiet      ICD-10-CM   1. Superficial retinal hemorrhage of right eye  H35.61     2. Stable treated proliferative diabetic retinopathy of right eye determined by examination associated with type 2 diabetes mellitus ([place])  E11.3551       1.  2.  3.  Ophthalmic Meds Ordered this visit:  No orders of the defined types were placed in this encounter.      Return in about 1 week (around 08/07/2021) for dilate, OD, POST OP, COLOR FP, OCT.  Patient Instructions  Ofloxacin  4 times daily to the operative eye  Prednisolone acetate 1 drop to the operative eye 4 times daily  Patient instructed not to refill the medications and use them for maximum of 3 weeks.  Patient instructed do not rub the eye.  Patient has the option to use the patch at night.   No lifting and bending for 1 week. No water IN the eye for 10 days. Do not rub the eye. Wear shield at night for 1-3 days.  Continue your topical medications for a total of 3 weeks.  Do not refill your postoperative medications unless instructed.  Refrain from exercise or intentional activity which increases our heart rate above resting levels.  Normal walking to complete normal activities of your day are appropriate.  Driving:  Legally, you only need one good eye, of 20/40 or better to drive.  However, the practice does not recommend driving during first weeks after surgery, IF you are uncomfortable with your visual functioning or capabilities.   If you have known sleep apnea, wear your CPAP as you normally should.   Explained the diagnoses,  plan, and follow up with the patient and they expressed understanding.  Patient expressed understanding of the importance of proper follow up care.   Clent Demark Kurstin Dimarzo M.D. Diseases & Surgery of the Retina and Vitreous Retina & Diabetic [place] 07/31/21     Abbreviations: M myopia (nearsighted); A astigmatism; H hyperopia (farsighted); P presbyopia; Mrx spectacle prescription;  CTL contact lenses; OD right eye; OS left eye; OU both eyes  XT exotropia; ET esotropia; PEK punctate epithelial keratitis; PEE punctate epithelial erosions; DES dry eye syndrome; MGD meibomian gland dysfunction; ATs artificial tears; PFAT's preservative free artificial tears; [place] nuclear sclerotic cataract; PSC posterior subcapsular cataract; ERM epi-retinal membrane; PVD posterior vitreous detachment; RD retinal detachment; DM diabetes mellitus; DR diabetic retinopathy; NPDR non-proliferative diabetic retinopathy; PDR proliferative diabetic retinopathy; CSME clinically significant macular edema; DME diabetic macular edema; dbh dot blot hemorrhages; CWS cotton wool spot; POAG primary open angle glaucoma; C/D cup-to-disc ratio; HVF humphrey visual field; GVF goldmann visual field; OCT optical coherence tomography; IOP intraocular pressure; BRVO Branch retinal vein occlusion; CRVO central retinal vein occlusion; CRAO central retinal artery occlusion; BRAO branch retinal artery occlusion; RT retinal tear; SB scleral buckle; PPV pars plana vitrectomy; VH Vitreous hemorrhage; PRP panretinal laser photocoagulation; IVK intravitreal kenalog; VMT vitreomacular traction; MH Macular hole;  NVD neovascularization of the disc; NVE neovascularization elsewhere; AREDS age related eye disease study; ARMD age related macular degeneration; POAG primary open angle glaucoma; EBMD epithelial/anterior basement membrane dystrophy; ACIOL anterior chamber intraocular lens; IOL intraocular lens; PCIOL posterior chamber intraocular lens; Phaco/IOL  phacoemulsification  with intraocular lens placement; [place] photorefractive keratectomy; LASIK laser assisted in situ keratomileusis; HTN hypertension; DM diabetes mellitus; COPD chronic obstructive pulmonary disease

## 2021-07-31 NOTE — Assessment & Plan Note (Signed)
PDR quiet

## 2021-07-31 NOTE — Assessment & Plan Note (Signed)
Prefoveal subtle internal limiting membrane hemorrhage, in the setting of diabetic retinopathy.  Vitrectomy ILM peel 07-30-2021

## 2021-08-07 ENCOUNTER — Ambulatory Visit (INDEPENDENT_AMBULATORY_CARE_PROVIDER_SITE_OTHER): Payer: Medicare HMO | Admitting: Ophthalmology

## 2021-08-07 ENCOUNTER — Encounter (INDEPENDENT_AMBULATORY_CARE_PROVIDER_SITE_OTHER): Payer: Medicare HMO | Admitting: Ophthalmology

## 2021-08-07 DIAGNOSIS — E113551 Type 2 diabetes mellitus with stable proliferative diabetic retinopathy, right eye: Secondary | ICD-10-CM

## 2021-08-07 DIAGNOSIS — H3561 Retinal hemorrhage, right eye: Secondary | ICD-10-CM

## 2021-08-07 NOTE — Assessment & Plan Note (Signed)
1 week post vitrectomy and ILM peel to release and to allow for prefoveal Sub internal limiting membrane hemorrhage to clear and allow visual acuity to improve.  Vision is improving.

## 2021-08-07 NOTE — Progress Notes (Signed)
08/07/2021     CHIEF COMPLAINT Patient presents for  Chief Complaint  Patient presents with   Post-op Follow-up      HISTORY OF PRESENT ILLNESS: Michael [name] is a 88 y.o. male who presents to the clinic today for:   HPI     Post-op Follow-up           Laterality: right eye   Discomfort: floaters.  Negative for pain, itching, foreign body sensation, tearing and discharge   Vision: is improved         Comments   1 week for DILATE, OD, POST OP, COLOR FP, OCT. SX 07/30/21 Pt stated vision has improved since last visit. Pt stated he can see smaller print and each day is getting better. However, pt reports, "it feels like there is something in my right eye." I can see better      Last edited by Hurman Horn, MD on 08/07/2021  9:37 AM.      Referring physician: Janie Morning, DO [address] [place],  [place] [address]  HISTORICAL INFORMATION:   Selected notes from the MEDICAL RECORD NUMBER    Lab Results  Component Value Date   HGBA1C 7.0 (H) 11/04/2017     CURRENT MEDICATIONS: Current Outpatient Medications (Ophthalmic Drugs)  Medication Sig   moxifloxacin (VIGAMOX) 0.5 % ophthalmic solution Place 1 drop into the right eye 3 (three) times daily for 20 days.   Polyethyl Glycol-Propyl Glycol (SYSTANE OP) Place 1 drop into both eyes 2 (two) times daily.   No current facility-administered medications for this visit. (Ophthalmic Drugs)   Current Outpatient Medications (Other)  Medication Sig   acetaminophen (TYLENOL) 650 MG CR tablet Take 1 tablet (650 mg total) by mouth 2 (two) times daily as needed for pain.   aspirin EC 81 MG tablet Take 81 mg by mouth daily.   Cholecalciferol (VITAMIN D3) 1000 units CAPS Take 1,000 Units by mouth 2 (two) times daily.    Cyanocobalamin (VITAMIN B 12 PO) Take 1,000 mcg by mouth daily.   ibrutinib (IMBRUVICA) 280 MG tablet TAKE 1 TABLET ('280MG'$ ) BY MOUTH DAILY.   insulin NPH-regular Human (70-30) 100 UNIT/ML  injection Inject 35 Units into the skin 2 (two) times daily.   mirtazapine (REMERON) 15 MG tablet Take 1 tablet (15 mg total) by mouth at bedtime.   Multiple Vitamin (MULTIVITAMIN WITH MINERALS) TABS tablet Take 1 tablet by mouth daily.   pantoprazole (PROTONIX) 40 MG tablet Take 40 mg by mouth daily.   polyethylene glycol (MIRALAX / GLYCOLAX) packet Take 17 g by mouth daily.   ramipril (ALTACE) 5 MG capsule Take 5 mg by mouth daily.   tamsulosin (FLOMAX) 0.4 MG CAPS capsule Take 0.4 mg by mouth daily.   torsemide (DEMADEX) 20 MG tablet Take 20 mg by mouth daily.   No current facility-administered medications for this visit. (Other)      REVIEW OF SYSTEMS: ROS   Negative for: Constitutional, Gastrointestinal, Neurological, Skin, Genitourinary, Musculoskeletal, HENT, Endocrine, Cardiovascular, Eyes, Respiratory, Psychiatric, Allergic/Imm, Heme/Lymph Last edited by Silvestre Moment on 08/07/2021  8:59 AM.       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   Lymphocytosis    Type II diabetes mellitus ([place])    Past Surgical History:  Procedure Laterality Date   BOWEL RESECTION N/A 11/15/2017   Procedure: SMALL BOWEL RESECTION;  Surgeon: Coralie Keens, MD;  Location: [facility];  Service: General;  Laterality: N/A;   CATARACT EXTRACTION W/ INTRAOCULAR LENS  IMPLANT, BILATERAL Bilateral    EYE SURGERY     Cataracts (bilateral)   GROIN DISSECTION Left 11/15/2017   Procedure: GROIN EXPLORATION WITH REMOVAL OF INGUINAL HERNIA MESH;  Surgeon: Coralie Keens, MD;  Location: [facility];  Service:  General;  Laterality: Left;   INGUINAL HERNIA REPAIR Left 11/10/2017   Procedure: LEFT INGUINAL [place];  Surgeon: Coralie Keens, MD;  Location: [facility];  Service: General;  Laterality: Left;   INGUINAL HERNIA REPAIR Left 11/15/2017   Procedure: LEFT INGUINAL HERNIA REPAIR WITH MESH;  Surgeon: Coralie Keens, MD;  Location: [facility];  Service: General;  Laterality: Left;   INSERTION OF MESH Left 11/10/2017   Procedure: INSERTION OF MESH;  Surgeon: Coralie Keens, MD;  [facility];  Service: General;  Laterality: Left;    FAMILY HISTORY Family History  Problem Relation Age of Onset   Cancer Mother    Hypertension Father    Diabetes Father     SOCIAL HISTORY Social History   Tobacco Use   Smoking status: Never   Smokeless tobacco: Former    Types: Snuff    Quit date: 2017  Vaping Use   Vaping Use: Never used  Substance Use Topics   Alcohol use: Not Currently   Drug use: No         OPHTHALMIC EXAM:  Base Eye Exam     Visual Acuity (ETDRS)       Right Left   Dist [place] 20/80 20/70 -1   Dist ph [place] 20/70 -1 20/60         Tonometry (Tonopen, 9:05 AM)       Right Left   Pressure 11 14         Pupils       Pupils APD   Right PERRL None   Left PERRL None         Visual Fields       Left Right    Full Full         Extraocular Movement       Right Left    Full Full         Neuro/Psych     Oriented x3: Yes   Mood/Affect: Normal         Dilation     Right eye: 2.5% Phenylephrine, 1.0% Mydriacyl @ 9:05 AM           Slit Lamp and Fundus Exam     External Exam       Right Left   External Normal Normal         Slit Lamp Exam       Right Left   Lids/Lashes Normal Normal   Conjunctiva/Sclera White and quiet White and quiet   Cornea Clear Clear   Anterior Chamber Deep and quiet Deep and quiet   Iris Round and reactive Round and reactive   Lens Centered posterior chamber intraocular lens Centered  posterior chamber intraocular lens   Anterior Vitreous Normal Normal  Fundus Exam       Right Left   Posterior Vitreous Normal    Disc Normal    C/D Ratio 0.5    Macula Post ilm removal changes, much less prefoveal hemorrhage, much less,     Vessels PDR-quiet, yet nonperfusion temporally    Periphery room temp for laser             IMAGING AND PROCEDURES  Imaging and Procedures for 08/07/21  OCT, Retina - OU - Both Eyes       Right Eye Quality was good. Scan locations included subfoveal. Central Foveal Thickness: 414. Progression has improved. Findings include abnormal foveal contour.   Left Eye Quality was good. Scan locations included subfoveal. Central Foveal Thickness: 337. Progression has been stable. Findings include normal foveal contour.   Notes Much smaller prefoveal hemorrhage now post vitrectomy and ILM peel.  Adherent hemorrhage still shadowing the fovea likely to continue to improve acuity over the coming weeks.      Color Fundus Photography Optos - OU - Both Eyes       Right Eye Disc findings include normal observations. Macula : microaneurysms.   Left Eye Progression has been stable. Disc findings include normal observations. Macula : microaneurysms.   Notes Good PRP nasal 270 degrees.  Good PRP OD with room temporal to macula.  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 We will need PRP temporally as well as anteriorly nasal                ASSESSMENT/PLAN:  Superficial retinal hemorrhage of right eye 1 week post vitrectomy and ILM peel to release and to allow for prefoveal Sub internal limiting membrane hemorrhage to clear and allow visual acuity to improve.  Vision is improving.  Stable treated proliferative  diabetic retinopathy of right eye determined by examination associated with type 2 diabetes mellitus ([place]) Stable OD     ICD-10-CM   1. Superficial retinal hemorrhage of right eye  H35.61 OCT, Retina - OU - Both Eyes    Color Fundus Photography Optos - OU - Both Eyes    2. Stable treated proliferative diabetic retinopathy of right eye determined by examination associated with type 2 diabetes mellitus ([place])  J18.8416       1.  OD vastly improved macular features and findings 1 week post vitrectomy ILM peel for prefoveal Valsalva retinopathy type hemorrhage.  2.  Good improvement per patient.  Photodocumentation confirms a perifoveal hemorrhage is much smaller.  3.  We will continue to observe patient to discontinue the drops in 2 weeks or promptly if medications are completed.  Do not refill either medication  Ophthalmic Meds Ordered this visit:  No orders of the defined types were placed in this encounter.      Return in about 3 weeks (around 08/28/2021) for COLOR FP, OCT, OD, POST OP.  Patient Instructions  Ofloxacin  4 times daily to the operative eye  Prednisolone acetate 1 drop to the operative eye 4 times daily  Patient instructed not to refill the medications and use them for maximum of 3 weeks.  Patient instructed do not rub the eye.  Patient has the option to use the patch at night.   No lifting and bending for 1 week. No water IN the eye for  10 days. Do not rub the eye. Wear shield at night for 1-3 days.  Continue your topical medications for a total of 3 weeks.  Do not refill your postoperative medications unless instructed.  Refrain from exercise or intentional activity which increases our heart rate above resting levels.  Normal walking to complete normal activities of your day are appropriate.  Driving:  Legally, you only need one good eye, of 20/40 or better to drive.  However, the practice does not recommend driving during first weeks after surgery, IF you are  uncomfortable with your visual functioning or capabilities.   If you have known sleep apnea, wear your CPAP as you normally should.    Explained the diagnoses, plan, and follow up with the patient and they expressed understanding.  Patient expressed understanding of the importance of proper follow up care.   Clent Demark Lasondra Hodgkins M.D. Diseases & Surgery of the Retina and Vitreous Retina & Diabetic [place] 08/07/21     Abbreviations: M myopia (nearsighted); A astigmatism; H hyperopia (farsighted); P presbyopia; Mrx spectacle prescription;  CTL contact lenses; OD right eye; OS left eye; OU both eyes  XT exotropia; ET esotropia; PEK punctate epithelial keratitis; PEE punctate epithelial erosions; DES dry eye syndrome; MGD meibomian gland dysfunction; ATs artificial tears; PFAT's preservative free artificial tears; [place] nuclear sclerotic cataract; PSC posterior subcapsular cataract; ERM epi-retinal membrane; PVD posterior vitreous detachment; RD retinal detachment; DM diabetes mellitus; DR diabetic retinopathy; NPDR non-proliferative diabetic retinopathy; PDR proliferative diabetic retinopathy; CSME clinically significant macular edema; DME diabetic macular edema; dbh dot blot hemorrhages; CWS cotton wool spot; POAG primary open angle glaucoma; C/D cup-to-disc ratio; HVF humphrey visual field; GVF goldmann visual field; OCT optical coherence tomography; IOP intraocular pressure; BRVO Branch retinal vein occlusion; CRVO central retinal vein occlusion; CRAO central retinal artery occlusion; BRAO branch retinal artery occlusion; RT retinal tear; SB scleral buckle; PPV pars plana vitrectomy; VH Vitreous hemorrhage; PRP panretinal laser photocoagulation; IVK intravitreal kenalog; VMT vitreomacular traction; MH Macular hole;  NVD neovascularization of the disc; NVE neovascularization elsewhere; AREDS age related eye disease study; ARMD age related macular degeneration; POAG primary open angle glaucoma; EBMD  epithelial/anterior basement membrane dystrophy; ACIOL anterior chamber intraocular lens; IOL intraocular lens; PCIOL posterior chamber intraocular lens; Phaco/IOL phacoemulsification with intraocular lens placement; [place] photorefractive keratectomy; LASIK laser assisted in situ keratomileusis; HTN hypertension; DM diabetes mellitus; COPD chronic obstructive pulmonary disease

## 2021-08-07 NOTE — Patient Instructions (Signed)

## 2021-08-07 NOTE — Assessment & Plan Note (Signed)
Stable OD °

## 2021-08-15 ENCOUNTER — Inpatient Hospital Stay: Payer: Medicare HMO | Admitting: Hematology and Oncology

## 2021-08-15 ENCOUNTER — Other Ambulatory Visit: Payer: Self-pay

## 2021-08-15 ENCOUNTER — Encounter: Payer: Self-pay | Admitting: Hematology and Oncology

## 2021-08-15 ENCOUNTER — Inpatient Hospital Stay: Payer: Medicare HMO | Attending: Hematology and Oncology

## 2021-08-15 DIAGNOSIS — D630 Anemia in neoplastic disease: Secondary | ICD-10-CM | POA: Diagnosis not present

## 2021-08-15 DIAGNOSIS — C9110 Chronic lymphocytic leukemia of B-cell type not having achieved remission: Secondary | ICD-10-CM

## 2021-08-15 DIAGNOSIS — N183 Chronic kidney disease, stage 3 unspecified: Secondary | ICD-10-CM

## 2021-08-15 LAB — COMPREHENSIVE METABOLIC PANEL
ALT: 6 U/L (ref 0–44)
AST: 12 U/L — ABNORMAL LOW (ref 15–41)
Albumin: 3.6 g/dL (ref 3.5–5.0)
Alkaline Phosphatase: 43 U/L (ref 38–126)
Anion gap: 5 (ref 5–15)
BUN: 35 mg/dL — ABNORMAL HIGH (ref 8–23)
CO2: 26 mmol/L (ref 22–32)
Calcium: 8.7 mg/dL — ABNORMAL LOW (ref 8.9–10.3)
Chloride: 107 mmol/L (ref 98–111)
Creatinine, Ser: 1.47 mg/dL — ABNORMAL HIGH (ref 0.61–1.24)
GFR, Estimated: 45 mL/min — ABNORMAL LOW (ref >60)
Glucose, Bld: 157 mg/dL — ABNORMAL HIGH (ref 70–99)
Potassium: 4.4 mmol/L (ref 3.5–5.1)
Sodium: 138 mmol/L (ref 135–145)
Total Bilirubin: 0.3 mg/dL (ref 0.3–1.2)
Total Protein: 5.9 g/dL — ABNORMAL LOW (ref 6.5–8.1)

## 2021-08-15 LAB — CBC WITH DIFFERENTIAL/PLATELET
Abs Immature Granulocytes: 0.06 10*3/uL (ref 0.00–0.07)
Basophils Absolute: 0.1 10*3/uL (ref 0.0–0.1)
Basophils Relative: 1 %
Eosinophils Absolute: 0.1 10*3/uL (ref 0.0–0.5)
Eosinophils Relative: 1 %
HCT: 30.9 % — ABNORMAL LOW (ref 39.0–52.0)
Hemoglobin: 10.5 g/dL — ABNORMAL LOW (ref 13.0–17.0)
Immature Granulocytes: 1 %
Lymphocytes Relative: 47 %
Lymphs Abs: 4.3 10*3/uL — ABNORMAL HIGH (ref 0.7–4.0)
MCH: 29.6 pg (ref 26.0–34.0)
MCHC: 34 g/dL (ref 30.0–36.0)
MCV: 87 fL (ref 80.0–100.0)
Monocytes Absolute: 0.6 10*3/uL (ref 0.1–1.0)
Monocytes Relative: 7 %
Neutro Abs: 3.9 10*3/uL (ref 1.7–7.7)
Neutrophils Relative %: 43 %
Platelets: 162 10*3/uL (ref 150–400)
RBC: 3.55 MIL/uL — ABNORMAL LOW (ref 4.22–5.81)
RDW: 14.7 % (ref 11.5–15.5)
WBC: 8.9 10*3/uL (ref 4.0–10.5)
nRBC: 0 % (ref 0.0–0.2)

## 2021-08-15 NOTE — Assessment & Plan Note (Signed)
He tolerated treatment very well Lymphocytosis is resolved His hemoglobin is stable We will continue treatment without dose adjustment I will see him back 3-4  months for follow-up I do not plan surveillance imaging studies unless he have signs or symptoms to suggest cancer progression

## 2021-08-15 NOTE — Assessment & Plan Note (Signed)
He has intermittent elevated serum creatinine We discussed importance of adequate hydration and risk factor modification

## 2021-08-15 NOTE — Assessment & Plan Note (Signed)
Anemia is stable It is likely related to chronic kidney disease and his underlying bone marrow disorder He is not symptomatic Observe only

## 2021-08-15 NOTE — Progress Notes (Signed)
[facility] OFFICE PROGRESS NOTE  Patient Care Team: Janie Morning, DO as PCP - General (Family Medicine) Heath Lark, MD as Consulting Physician (Hematology and Oncology)  ASSESSMENT & PLAN:  CLL (chronic lymphocytic leukemia) ([place]) He tolerated treatment very well Lymphocytosis is resolved His hemoglobin is stable We will continue treatment without dose adjustment I will see him back 3-4  months for follow-up I do not plan surveillance imaging studies unless he have signs or symptoms to suggest cancer progression  Chronic kidney disease, stage III (moderate) He has intermittent elevated serum creatinine We discussed importance of adequate hydration and risk factor modification  Anemia in neoplastic disease Anemia is stable It is likely related to chronic kidney disease and his underlying bone marrow disorder He is not symptomatic Observe only  No orders of the defined types were placed in this encounter.   All questions were answered. The patient knows to call the clinic with any problems, questions or concerns. The total time spent in the appointment was 20 minutes encounter with patients including review of chart and various tests results, discussions about plan of care and coordination of care plan   Heath Lark, MD 08/15/2021 2:20 PM  INTERVAL HISTORY: Please see below for problem oriented charting. he returns for treatment follow-up on ibrutinib for CLL He tolerated treatment well No recent infection Denies recent bleeding He has chronic bilateral lower extremity edema, stable  REVIEW OF SYSTEMS:   Constitutional: Denies fevers, chills or abnormal weight loss Eyes: Denies blurriness of vision Ears, nose, mouth, throat, and face: Denies mucositis or sore throat Respiratory: Denies cough, dyspnea or wheezes Cardiovascular: Denies palpitation, chest discomfort  Gastrointestinal:  Denies nausea, heartburn or change in bowel habits Skin: Denies abnormal  skin rashes Lymphatics: Denies new lymphadenopathy or easy bruising Neurological:Denies numbness, tingling or new weaknesses Behavioral/Psych: Mood is stable, no new changes  All other systems were reviewed with the patient and are negative.  I have reviewed the past medical history, past surgical history, social history and family history with the patient and they are unchanged from previous note.  ALLERGIES:  has No Known Allergies.  MEDICATIONS:  Current Outpatient Medications  Medication Sig Dispense Refill   acetaminophen (TYLENOL) 650 MG CR tablet Take 1 tablet (650 mg total) by mouth 2 (two) times daily as needed for pain. 30 tablet 0   aspirin EC 81 MG tablet Take 81 mg by mouth daily.     Cholecalciferol (VITAMIN D3) 1000 units CAPS Take 1,000 Units by mouth 2 (two) times daily.      Cyanocobalamin (VITAMIN B 12 PO) Take 1,000 mcg by mouth daily.     ibrutinib (IMBRUVICA) 280 MG tablet TAKE 1 TABLET ('280MG'$ ) BY MOUTH DAILY. 28 tablet 11   insulin NPH-regular Human (70-30) 100 UNIT/ML injection Inject 35 Units into the skin 2 (two) times daily.     mirtazapine (REMERON) 15 MG tablet Take 1 tablet (15 mg total) by mouth at bedtime.     Multiple Vitamin (MULTIVITAMIN WITH MINERALS) TABS tablet Take 1 tablet by mouth daily.     pantoprazole (PROTONIX) 40 MG tablet Take 40 mg by mouth daily.     Polyethyl Glycol-Propyl Glycol (SYSTANE OP) Place 1 drop into both eyes 2 (two) times daily.     polyethylene glycol (MIRALAX / GLYCOLAX) packet Take 17 g by mouth daily. 14 each 0   ramipril (ALTACE) 5 MG capsule Take 5 mg by mouth daily.     tamsulosin (FLOMAX) 0.4 MG  CAPS capsule Take 0.4 mg by mouth daily.     torsemide (DEMADEX) 20 MG tablet Take 20 mg by mouth daily.     No current facility-administered medications for this visit.    SUMMARY OF ONCOLOGIC HISTORY: Oncology History  CLL (chronic lymphocytic leukemia) ([place])  04/03/2008 Pathology Results   Case #: [id number] FLow cytomtry  confirmed CLL   10/11/2014 Tumor Marker   FISH analysis showed trisomy 12 abnormality   04/29/2015 -  Chemotherapy   He started taking Ibrutinib   04/29/2015 Imaging   Ct scan showed mild lymphadenopathy and splenomegaly   05/13/2015 Adverse Reaction   Due to neutropenia, dose of Ibrutinib is reduced to 280 mg daily   01/08/2016 Imaging   Response to therapy. Resolution of thoracic and abdominal adenopathy. Resolution of splenomegaly. 2. No new or progressive disease. 3. Small bowel containing left inguinal hernia and right-sided hydrocele, as before. 4. Esophageal air fluid level suggests dysmotility or gastroesophageal reflux. 5.  Possible constipation. 6. Suspect hepatic hemangiomas, similar.   10/09/2016 Imaging   No findings suspicious for active lymphomatous involvement.  No abdominopelvic lymphadenopathy.  Spleen is normal in size.  Moderate to large left inguinal/scrotal hernia containing small bowel and fat. No evidence of bowel obstruction.  Additional stable ancillary findings as above.   04/26/2017 Imaging   1. No lymphadenopathy in the chest, abdomen, or pelvis. 2. Mild circumferential bladder wall thickening. Incomplete distention may contribute to this appearance, but bladder infection is also consideration. 3. Stable appearance small hepatic lesions. Imaging features most suggestive of benign cavernous hemangiomas. 4. Stable tiny low-density renal lesions compatible with cysts. 5. Large left groin hernia contains numerous small bowel loops without complicating features. 6. Right-sided hydrocele.   11/15/2017 Imaging   Large recurrent left inguinal hernia with extension of multiple small bowel loops into the inguinal canal and scrotum on the left. Proximal small bowel dilatation is noted consistent with a degree of incarceration.   Bilateral small effusions as well as patchy bibasilar infiltrates.     12/14/2017 Adverse Reaction   Ibrutinib placed on hold   06/17/2020  Cancer Staging   Staging form: Chronic Lymphocytic Leukemia / Small Lymphocytic Lymphoma, AJCC 8th Edition - Clinical stage from 06/17/2020: Modified Rai Stage 0 (Modified Rai risk: Low, Lymphocytosis: Present, Adenopathy: Absent, Organomegaly: Absent, Anemia: Absent, Thrombocytopenia: Absent) - Signed by Heath Lark, MD on 06/17/2020 Stage prefix: Initial diagnosis     PHYSICAL EXAMINATION: ECOG PERFORMANCE STATUS: 1 - Symptomatic but completely ambulatory  Vitals:   08/15/21 1101  BP: (!) 142/47  Pulse: 62  Temp: 99.5 F (37.5 C)  SpO2: 100%   Filed Weights   08/15/21 1101  Weight: 208 lb 9.6 oz (94.6 kg)    GENERAL:alert, no distress and comfortable NEURO: alert & oriented x 3 with fluent speech, no focal motor/sensory deficits  LABORATORY DATA:  I have reviewed the data as listed    Component Value Date/Time   NA 138 08/15/2021 1040   NA 140 10/09/2016 1106   K 4.4 08/15/2021 1040   K 4.7 10/09/2016 1106   CL 107 08/15/2021 1040   CL 104 03/28/2012 1426   CO2 26 08/15/2021 1040   CO2 28 10/09/2016 1106   GLUCOSE 157 (H) 08/15/2021 1040   GLUCOSE 127 10/09/2016 1106   GLUCOSE 171 (H) 03/28/2012 1426   BUN 35 (H) 08/15/2021 1040   BUN 20.8 10/09/2016 1106   CREATININE 1.47 (H) 08/15/2021 1040   CREATININE 1.15 09/06/2018 1016  CREATININE 1.1 10/09/2016 1106   CALCIUM 8.7 (L) 08/15/2021 1040   CALCIUM 9.1 10/09/2016 1106   PROT 5.9 (L) 08/15/2021 1040   PROT 5.8 (L) 10/09/2016 1106   ALBUMIN 3.6 08/15/2021 1040   ALBUMIN 3.1 (L) 10/09/2016 1106   AST 12 (L) 08/15/2021 1040   AST 18 09/06/2018 1016   AST 17 10/09/2016 1106   ALT 6 08/15/2021 1040   ALT 12 09/06/2018 1016   ALT 8 10/09/2016 1106   ALKPHOS 43 08/15/2021 1040   ALKPHOS 46 10/09/2016 1106   BILITOT 0.3 08/15/2021 1040   BILITOT 0.3 09/06/2018 1016   BILITOT 0.23 10/09/2016 1106   GFRNONAA 45 (L) 08/15/2021 1040   GFRNONAA 57 (L) 09/06/2018 1016   GFRAA 59 (L) 09/12/2019 1034   GFRAA >60  09/06/2018 1016    No results found for: "SPEP", "UPEP"  Lab Results  Component Value Date   WBC 8.9 08/15/2021   NEUTROABS 3.9 08/15/2021   HGB 10.5 (L) 08/15/2021   HCT 30.9 (L) 08/15/2021   MCV 87.0 08/15/2021   PLT 162 08/15/2021      Chemistry      Component Value Date/Time   NA 138 08/15/2021 1040   NA 140 10/09/2016 1106   K 4.4 08/15/2021 1040   K 4.7 10/09/2016 1106   CL 107 08/15/2021 1040   CL 104 03/28/2012 1426   CO2 26 08/15/2021 1040   CO2 28 10/09/2016 1106   BUN 35 (H) 08/15/2021 1040   BUN 20.8 10/09/2016 1106   CREATININE 1.47 (H) 08/15/2021 1040   CREATININE 1.15 09/06/2018 1016   CREATININE 1.1 10/09/2016 1106      Component Value Date/Time   CALCIUM 8.7 (L) 08/15/2021 1040   CALCIUM 9.1 10/09/2016 1106   ALKPHOS 43 08/15/2021 1040   ALKPHOS 46 10/09/2016 1106   AST 12 (L) 08/15/2021 1040   AST 18 09/06/2018 1016   AST 17 10/09/2016 1106   ALT 6 08/15/2021 1040   ALT 12 09/06/2018 1016   ALT 8 10/09/2016 1106   BILITOT 0.3 08/15/2021 1040   BILITOT 0.3 09/06/2018 1016   BILITOT 0.23 10/09/2016 1106

## 2021-08-19 ENCOUNTER — Other Ambulatory Visit (HOSPITAL_COMMUNITY): Payer: Self-pay

## 2021-08-26 ENCOUNTER — Other Ambulatory Visit (HOSPITAL_COMMUNITY): Payer: Self-pay

## 2021-08-28 ENCOUNTER — Other Ambulatory Visit (HOSPITAL_COMMUNITY): Payer: Self-pay

## 2021-08-28 ENCOUNTER — Ambulatory Visit (INDEPENDENT_AMBULATORY_CARE_PROVIDER_SITE_OTHER): Payer: Medicare HMO | Admitting: Ophthalmology

## 2021-08-28 ENCOUNTER — Encounter (INDEPENDENT_AMBULATORY_CARE_PROVIDER_SITE_OTHER): Payer: Medicare HMO | Admitting: Ophthalmology

## 2021-08-28 ENCOUNTER — Encounter (INDEPENDENT_AMBULATORY_CARE_PROVIDER_SITE_OTHER): Payer: Self-pay | Admitting: Ophthalmology

## 2021-08-28 DIAGNOSIS — H3561 Retinal hemorrhage, right eye: Secondary | ICD-10-CM | POA: Diagnosis not present

## 2021-08-28 DIAGNOSIS — E113551 Type 2 diabetes mellitus with stable proliferative diabetic retinopathy, right eye: Secondary | ICD-10-CM

## 2021-08-28 NOTE — Assessment & Plan Note (Signed)
OD looks great, improved acuity.   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

## 2021-08-28 NOTE — Progress Notes (Signed)
08/28/2021     CHIEF COMPLAINT Patient presents for  Chief Complaint  Patient presents with   Post-op Follow-up      HISTORY OF PRESENT ILLNESS: Michael [name] is a 88 y.o. male who presents to the clinic today for:   HPI     Post-op Follow-up           Laterality: right eye   Discomfort: Negative for pain, itching, foreign body sensation, tearing, discharge and floaters   Vision: is improved         Comments     3 weeks for COLOR FP, OCT, OD, POST OP. Pt stated vision has been stable since last visit.       Last edited by Hurman Horn, MD on 08/28/2021 10:00 AM.      Referring physician: Janie Morning, DO [address] [place],  [place] [address]  HISTORICAL INFORMATION:   Selected notes from the MEDICAL RECORD NUMBER    Lab Results  Component Value Date   HGBA1C 7.0 (H) 11/04/2017     CURRENT MEDICATIONS: Current Outpatient Medications (Ophthalmic Drugs)  Medication Sig   Polyethyl Glycol-Propyl Glycol (SYSTANE OP) Place 1 drop into both eyes 2 (two) times daily.   No current facility-administered medications for this visit. (Ophthalmic Drugs)   Current Outpatient Medications (Other)  Medication Sig   acetaminophen (TYLENOL) 650 MG CR tablet Take 1 tablet (650 mg total) by mouth 2 (two) times daily as needed for pain.   aspirin EC 81 MG tablet Take 81 mg by mouth daily.   Cholecalciferol (VITAMIN D3) 1000 units CAPS Take 1,000 Units by mouth 2 (two) times daily.    Cyanocobalamin (VITAMIN B 12 PO) Take 1,000 mcg by mouth daily.   ibrutinib (IMBRUVICA) 280 MG tablet TAKE 1 TABLET ('280MG'$ ) BY MOUTH DAILY.   insulin NPH-regular Human (70-30) 100 UNIT/ML injection Inject 35 Units into the skin 2 (two) times daily.   mirtazapine (REMERON) 15 MG tablet Take 1 tablet (15 mg total) by mouth at bedtime.   Multiple Vitamin (MULTIVITAMIN WITH MINERALS) TABS tablet Take 1 tablet by mouth daily.   pantoprazole (PROTONIX) 40 MG tablet Take 40 mg  by mouth daily.   polyethylene glycol (MIRALAX / GLYCOLAX) packet Take 17 g by mouth daily.   ramipril (ALTACE) 5 MG capsule Take 5 mg by mouth daily.   tamsulosin (FLOMAX) 0.4 MG CAPS capsule Take 0.4 mg by mouth daily.   torsemide (DEMADEX) 20 MG tablet Take 20 mg by mouth daily.   No current facility-administered medications for this visit. (Other)      REVIEW OF SYSTEMS: ROS   Negative for: Constitutional, Gastrointestinal, Neurological, Skin, Genitourinary, Musculoskeletal, HENT, Endocrine, Cardiovascular, Eyes, Respiratory, Psychiatric, Allergic/Imm, Heme/Lymph Last edited by Silvestre Moment on 08/28/2021  9:17 AM.       ALLERGIES No Known Allergies  PAST MEDICAL HISTORY Past Medical History:  Diagnosis Date   Anemia    Anginal pain ([place])    upon exertion   Arthritis    "left shoulder, lower back" (10/20/2016)   CLL (chronic lymphoblastic leukemia) dx'd 2000   Edema leg    Family history of adverse reaction to anesthesia    "daughter's BP drops"    GERD (gastroesophageal reflux disease)    History of hiatal hernia    Leukemia, chronic lymphoid ([place]) 12/08/2010   Lymphocytosis    Type II diabetes mellitus ([place])    Past Surgical History:  Procedure Laterality Date   BOWEL RESECTION  N/A 11/15/2017   Procedure: SMALL BOWEL RESECTION;  Surgeon: Coralie Keens, MD;  Location: [facility];  Service: General;  Laterality: N/A;   CATARACT EXTRACTION W/ INTRAOCULAR LENS  IMPLANT, BILATERAL Bilateral    EYE SURGERY     Cataracts (bilateral)   GROIN DISSECTION Left 11/15/2017   Procedure: GROIN EXPLORATION WITH REMOVAL OF INGUINAL HERNIA MESH;  Surgeon: Coralie Keens, MD;  Location: [facility];  Service: General;  Laterality: Left;   INGUINAL HERNIA REPAIR Left 11/10/2017   Procedure: LEFT INGUINAL HERNIA REPAIR ERAS PATHWAY;  Surgeon: Coralie Keens, MD;  Location: [facility];  Service: General;  Laterality: Left;   INGUINAL HERNIA REPAIR Left 11/15/2017   Procedure: LEFT INGUINAL  HERNIA REPAIR WITH MESH;  Surgeon: Coralie Keens, MD;  Location: [facility];  Service: General;  Laterality: Left;   INSERTION OF MESH Left 11/10/2017   Procedure: INSERTION OF MESH;  Surgeon: Coralie Keens, MD;  Location: [facility];  Service: General;  Laterality: Left;    FAMILY HISTORY Family History  Problem Relation Age of Onset   Cancer Mother    Hypertension Father    Diabetes Father     SOCIAL HISTORY Social History   Tobacco Use   Smoking status: Never   Smokeless tobacco: Former    Types: Snuff    Quit date: 2017  Vaping Use   Vaping Use: Never used  Substance Use Topics   Alcohol use: Not Currently   Drug use: No         OPHTHALMIC EXAM:  Base Eye Exam     Visual Acuity (ETDRS)       Right Left   Dist cc 20/60 -1 20/60 -2   Dist ph cc 20/50 NI    Correction: Glasses         Tonometry (Tonopen, 9:23 AM)       Right Left   Pressure 14 14         Pupils       Pupils APD   Right PERRL None   Left PERRL None         Visual Fields       Left Right    Full Full         Extraocular Movement       Right Left    Full, Ortho Full, Ortho         Neuro/Psych     Oriented x3: Yes   Mood/Affect: Normal         Dilation     Right eye: 2.5% Phenylephrine, 1.0% Mydriacyl @ 9:22 AM           Slit Lamp and Fundus Exam     External Exam       Right Left   External Normal Normal         Slit Lamp Exam       Right Left   Lids/Lashes Normal Normal   Conjunctiva/Sclera White and quiet White and quiet   Cornea Clear Clear   Anterior Chamber Deep and quiet Deep and quiet   Iris Round and reactive Round and reactive   Lens Centered posterior chamber intraocular lens Centered posterior chamber intraocular lens   Anterior Vitreous Normal Normal         Fundus Exam       Right Left   Posterior Vitreous Clear and avitric    Disc Normal    C/D Ratio 0.5    Macula Post ilm removal changes, no prefoveal hemorrhage,  remains    Vessels PDR-quiet, yet nonperfusion temporally    Periphery Good PRP 360.             IMAGING AND PROCEDURES  Imaging and Procedures for 08/28/21  OCT, Retina - OU - Both Eyes       Right Eye Quality was good. Scan locations included subfoveal. Central Foveal Thickness: 390. Progression has improved. Findings include abnormal foveal contour.   Left Eye Quality was good. Scan locations included subfoveal. Central Foveal Thickness: 339. Progression has been stable. Findings include normal foveal contour.   Notes No residual perifoveal hemorrhage post vitrectomy ILM peel.  OD.  Stable overall     Color Fundus Photography Optos - OU - Both Eyes       Right Eye Disc findings include normal observations. Macula : microaneurysms.   Left Eye Progression has been stable. Disc findings include normal observations. Macula : microaneurysms.   Notes Good PRP nasal 270 degrees.  Good PRP OD with room temporal to macula.  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 We will need PRP temporally as well as anteriorly nasal               ASSESSMENT/PLAN:  Stable treated proliferative diabetic retinopathy of right eye determined by examination associated with type 2 diabetes mellitus ([place]) OD looks great, improved acuity.   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     ICD-10-CM   1. Superficial retinal hemorrhage of right eye  H35.61 OCT, Retina - OU - Both Eyes    Color Fundus Photography Optos - OU - Both Eyes    2. Stable treated proliferative diabetic retinopathy of right eye determined by examination associated with type 2 diabetes  mellitus ([place])  G86.7619       1.  PDR OD doing very well stable OU.  We will continue to observe  2.  OD has improved nicely  3.  Patient understands not mash compress around the  Ophthalmic Meds Ordered this visit:  No orders of the defined types were placed in this encounter.      Return in about 6 months (around 02/28/2022) for DILATE OU, COLOR FP, OCT.  There are no Patient Instructions on file for this visit.   Explained the diagnoses, plan, and follow up with the patient and they expressed understanding.  Patient expressed understanding of the importance of proper follow up care.   Clent Demark Konstantin Lehnen M.D. Diseases & Surgery of the Retina and Vitreous [facility]abetic [place] 08/28/21     Abbreviations: M myopia (nearsighted); A astigmatism; H hyperopia (farsighted); P presbyopia; Mrx spectacle prescription;  CTL contact lenses; OD right eye; OS left eye; OU both eyes  XT exotropia; ET esotropia; PEK punctate epithelial keratitis; PEE punctate epithelial erosions; DES dry eye syndrome; MGD meibomian gland dysfunction; ATs artificial tears; PFAT's preservative free artificial tears; [place] nuclear sclerotic cataract; PSC posterior subcapsular cataract; ERM epi-retinal membrane; PVD posterior vitreous detachment; RD retinal detachment; DM diabetes mellitus; DR diabetic retinopathy; NPDR non-proliferative diabetic retinopathy; PDR proliferative diabetic retinopathy; CSME clinically significant macular edema; DME diabetic macular edema; dbh dot blot hemorrhages; CWS cotton wool spot; POAG primary open angle glaucoma; C/D cup-to-disc ratio; HVF humphrey visual field;  GVF goldmann visual field; OCT optical coherence tomography; IOP intraocular pressure; BRVO Branch retinal vein occlusion; CRVO central retinal vein occlusion; CRAO central retinal artery occlusion; BRAO branch retinal artery occlusion; RT retinal tear; SB scleral buckle; PPV pars plana vitrectomy; VH Vitreous hemorrhage; PRP  panretinal laser photocoagulation; IVK intravitreal kenalog; VMT vitreomacular traction; MH Macular hole;  NVD neovascularization of the disc; NVE neovascularization elsewhere; AREDS age related eye disease study; ARMD age related macular degeneration; POAG primary open angle glaucoma; EBMD epithelial/anterior basement membrane dystrophy; ACIOL anterior chamber intraocular lens; IOL intraocular lens; PCIOL posterior chamber intraocular lens; Phaco/IOL phacoemulsification with intraocular lens placement; [place] photorefractive keratectomy; LASIK laser assisted in situ keratomileusis; HTN hypertension; DM diabetes mellitus; COPD chronic obstructive pulmonary disease

## 2021-09-11 DIAGNOSIS — M5416 Radiculopathy, lumbar region: Secondary | ICD-10-CM | POA: Diagnosis not present

## 2021-09-11 DIAGNOSIS — Z79891 Long term (current) use of opiate analgesic: Secondary | ICD-10-CM | POA: Diagnosis not present

## 2021-09-11 DIAGNOSIS — G894 Chronic pain syndrome: Secondary | ICD-10-CM | POA: Diagnosis not present

## 2021-09-12 ENCOUNTER — Other Ambulatory Visit (HOSPITAL_COMMUNITY): Payer: Self-pay

## 2021-09-15 ENCOUNTER — Other Ambulatory Visit (HOSPITAL_COMMUNITY): Payer: Self-pay

## 2021-09-16 ENCOUNTER — Other Ambulatory Visit (HOSPITAL_COMMUNITY): Payer: Self-pay

## 2021-09-25 ENCOUNTER — Other Ambulatory Visit (HOSPITAL_COMMUNITY): Payer: Self-pay

## 2021-10-08 DIAGNOSIS — I10 Essential (primary) hypertension: Secondary | ICD-10-CM | POA: Diagnosis not present

## 2021-10-08 DIAGNOSIS — E113592 Type 2 diabetes mellitus with proliferative diabetic retinopathy without macular edema, left eye: Secondary | ICD-10-CM | POA: Diagnosis not present

## 2021-10-08 DIAGNOSIS — E1142 Type 2 diabetes mellitus with diabetic polyneuropathy: Secondary | ICD-10-CM | POA: Diagnosis not present

## 2021-10-08 DIAGNOSIS — I890 Lymphedema, not elsewhere classified: Secondary | ICD-10-CM | POA: Diagnosis not present

## 2021-10-15 DIAGNOSIS — Z794 Long term (current) use of insulin: Secondary | ICD-10-CM | POA: Diagnosis not present

## 2021-10-15 DIAGNOSIS — E785 Hyperlipidemia, unspecified: Secondary | ICD-10-CM | POA: Diagnosis not present

## 2021-10-15 DIAGNOSIS — E113551 Type 2 diabetes mellitus with stable proliferative diabetic retinopathy, right eye: Secondary | ICD-10-CM | POA: Diagnosis not present

## 2021-10-15 DIAGNOSIS — E113592 Type 2 diabetes mellitus with proliferative diabetic retinopathy without macular edema, left eye: Secondary | ICD-10-CM | POA: Diagnosis not present

## 2021-10-15 DIAGNOSIS — E1142 Type 2 diabetes mellitus with diabetic polyneuropathy: Secondary | ICD-10-CM | POA: Diagnosis not present

## 2021-10-15 DIAGNOSIS — R300 Dysuria: Secondary | ICD-10-CM | POA: Diagnosis not present

## 2021-10-16 ENCOUNTER — Other Ambulatory Visit (HOSPITAL_COMMUNITY): Payer: Self-pay

## 2021-10-21 ENCOUNTER — Other Ambulatory Visit (HOSPITAL_COMMUNITY): Payer: Self-pay

## 2021-10-22 ENCOUNTER — Other Ambulatory Visit (HOSPITAL_COMMUNITY): Payer: Self-pay

## 2021-11-12 ENCOUNTER — Other Ambulatory Visit (HOSPITAL_COMMUNITY): Payer: Self-pay

## 2021-11-13 ENCOUNTER — Other Ambulatory Visit (HOSPITAL_COMMUNITY): Payer: Self-pay

## 2021-11-18 ENCOUNTER — Inpatient Hospital Stay: Payer: Medicare HMO | Attending: Hematology and Oncology

## 2021-11-18 ENCOUNTER — Other Ambulatory Visit (HOSPITAL_COMMUNITY): Payer: Self-pay

## 2021-11-18 ENCOUNTER — Encounter: Payer: Self-pay | Admitting: Hematology and Oncology

## 2021-11-18 ENCOUNTER — Other Ambulatory Visit: Payer: Self-pay

## 2021-11-18 ENCOUNTER — Inpatient Hospital Stay: Payer: Medicare HMO | Admitting: Hematology and Oncology

## 2021-11-18 VITALS — BP 136/36 | HR 69 | Temp 98.1°F | Resp 18 | Ht 68.0 in | Wt 213.4 lb

## 2021-11-18 DIAGNOSIS — C9110 Chronic lymphocytic leukemia of B-cell type not having achieved remission: Secondary | ICD-10-CM

## 2021-11-18 DIAGNOSIS — D630 Anemia in neoplastic disease: Secondary | ICD-10-CM | POA: Diagnosis not present

## 2021-11-18 DIAGNOSIS — N183 Chronic kidney disease, stage 3 unspecified: Secondary | ICD-10-CM | POA: Diagnosis not present

## 2021-11-18 LAB — CBC WITH DIFFERENTIAL/PLATELET
Abs Immature Granulocytes: 0.05 10*3/uL (ref 0.00–0.07)
Basophils Absolute: 0.1 10*3/uL (ref 0.0–0.1)
Basophils Relative: 1 %
Eosinophils Absolute: 0.1 10*3/uL (ref 0.0–0.5)
Eosinophils Relative: 1 %
HCT: 29.7 % — ABNORMAL LOW (ref 39.0–52.0)
Hemoglobin: 9.7 g/dL — ABNORMAL LOW (ref 13.0–17.0)
Immature Granulocytes: 1 %
Lymphocytes Relative: 50 %
Lymphs Abs: 4.3 10*3/uL — ABNORMAL HIGH (ref 0.7–4.0)
MCH: 29.1 pg (ref 26.0–34.0)
MCHC: 32.7 g/dL (ref 30.0–36.0)
MCV: 89.2 fL (ref 80.0–100.0)
Monocytes Absolute: 0.6 10*3/uL (ref 0.1–1.0)
Monocytes Relative: 7 %
Neutro Abs: 3.5 10*3/uL (ref 1.7–7.7)
Neutrophils Relative %: 40 %
Platelets: 285 10*3/uL (ref 150–400)
RBC: 3.33 MIL/uL — ABNORMAL LOW (ref 4.22–5.81)
RDW: 14.6 % (ref 11.5–15.5)
WBC: 8.6 10*3/uL (ref 4.0–10.5)
nRBC: 0 % (ref 0.0–0.2)

## 2021-11-18 LAB — COMPREHENSIVE METABOLIC PANEL
ALT: 10 U/L (ref 0–44)
AST: 14 U/L — ABNORMAL LOW (ref 15–41)
Albumin: 3.5 g/dL (ref 3.5–5.0)
Alkaline Phosphatase: 45 U/L (ref 38–126)
Anion gap: 6 (ref 5–15)
BUN: 24 mg/dL — ABNORMAL HIGH (ref 8–23)
CO2: 25 mmol/L (ref 22–32)
Calcium: 8.3 mg/dL — ABNORMAL LOW (ref 8.9–10.3)
Chloride: 105 mmol/L (ref 98–111)
Creatinine, Ser: 1.44 mg/dL — ABNORMAL HIGH (ref 0.61–1.24)
GFR, Estimated: 46 mL/min — ABNORMAL LOW (ref >60)
Glucose, Bld: 194 mg/dL — ABNORMAL HIGH (ref 70–99)
Potassium: 5 mmol/L (ref 3.5–5.1)
Sodium: 136 mmol/L (ref 135–145)
Total Bilirubin: 0.4 mg/dL (ref 0.3–1.2)
Total Protein: 6.3 g/dL — ABNORMAL LOW (ref 6.5–8.1)

## 2021-11-18 NOTE — Progress Notes (Signed)
[place] OFFICE PROGRESS NOTE  Patient Care Team: Janie Morning, DO as PCP - General (Family Medicine) Heath Lark, MD as Consulting Physician (Hematology and Oncology)  ASSESSMENT & PLAN:  CLL (chronic lymphocytic leukemia) ([place]) He tolerated treatment very well Lymphocytosis is resolved His hemoglobin is stable He has been on ibrutinib for almost 6 years We discussed the risk and benefits of discontinuation of treatment and he is interested I recommend CT imaging next week for further follow-up before we discontinue treatment and he is in agreement  Chronic kidney disease, stage III (moderate) He has intermittent elevated serum creatinine We discussed importance of adequate hydration and risk factor modification  Anemia in neoplastic disease Anemia is stable It is likely related to chronic kidney disease  Observe only  Orders Placed This Encounter  Procedures   CT CHEST ABDOMEN PELVIS W CONTRAST    Standing Status:   Future    Standing Expiration Date:   11/19/2022    Order Specific Question:   Preferred imaging location?    Answer:   [facility]    Order Specific Question:   Radiology Contrast Protocol - do NOT remove file path    Answer:   [email or web address]    All questions were answered. The patient knows to call the clinic with any problems, questions or concerns. The total time spent in the appointment was 25 minutes encounter with patients including review of chart and various tests results, discussions about plan of care and coordination of care plan   Heath Lark, MD 11/18/2021 1:02 PM  INTERVAL HISTORY: Please see below for problem oriented charting. he returns for treatment follow-up on maintenance treatment with ibrutinib He is doing well except for recent knee pain Denies recent infection He is compliant taking medications as directed  REVIEW OF SYSTEMS:   Constitutional: Denies fevers,  chills or abnormal weight loss Eyes: Denies blurriness of vision Ears, nose, mouth, throat, and face: Denies mucositis or sore throat Respiratory: Denies cough, dyspnea or wheezes Cardiovascular: Denies palpitation, chest discomfort or lower extremity swelling Gastrointestinal:  Denies nausea, heartburn or change in bowel habits Skin: Denies abnormal skin rashes Lymphatics: Denies new lymphadenopathy or easy bruising Neurological:Denies numbness, tingling or new weaknesses Behavioral/Psych: Mood is stable, no new changes  All other systems were reviewed with the patient and are negative.  I have reviewed the past medical history, past surgical history, social history and family history with the patient and they are unchanged from previous note.  ALLERGIES:  has No Known Allergies.  MEDICATIONS:  Current Outpatient Medications  Medication Sig Dispense Refill   acetaminophen (TYLENOL) 650 MG CR tablet Take 1 tablet (650 mg total) by mouth 2 (two) times daily as needed for pain. 30 tablet 0   aspirin EC 81 MG tablet Take 81 mg by mouth daily.     Cholecalciferol (VITAMIN D3) 1000 units CAPS Take 1,000 Units by mouth 2 (two) times daily.      Cyanocobalamin (VITAMIN B 12 PO) Take 1,000 mcg by mouth daily.     ibrutinib (IMBRUVICA) 280 MG tablet TAKE 1 TABLET ('280MG'$ ) BY MOUTH DAILY. 28 tablet 11   insulin NPH-regular Human (70-30) 100 UNIT/ML injection Inject 35 Units into the skin 2 (two) times daily.     mirtazapine (REMERON) 15 MG tablet Take 1 tablet (15 mg total) by mouth at bedtime.     Multiple Vitamin (MULTIVITAMIN WITH MINERALS) TABS tablet Take 1 tablet by mouth daily.     pantoprazole (PROTONIX)  40 MG tablet Take 40 mg by mouth daily.     Polyethyl Glycol-Propyl Glycol (SYSTANE OP) Place 1 drop into both eyes 2 (two) times daily.     polyethylene glycol (MIRALAX / GLYCOLAX) packet Take 17 g by mouth daily. 14 each 0   ramipril (ALTACE) 5 MG capsule Take 5 mg by mouth daily.      tamsulosin (FLOMAX) 0.4 MG CAPS capsule Take 0.4 mg by mouth daily.     torsemide (DEMADEX) 20 MG tablet Take 20 mg by mouth daily.     No current facility-administered medications for this visit.    SUMMARY OF ONCOLOGIC HISTORY: Oncology History  CLL (chronic lymphocytic leukemia) ([place])  04/03/2008 Pathology Results   Case #: [id number] FLow cytomtry confirmed CLL   10/11/2014 Tumor Marker   FISH analysis showed trisomy 12 abnormality   04/29/2015 -  Chemotherapy   He started taking Ibrutinib   04/29/2015 Imaging   Ct scan showed mild lymphadenopathy and splenomegaly   05/13/2015 Adverse Reaction   Due to neutropenia, dose of Ibrutinib is reduced to 280 mg daily   01/08/2016 Imaging   Response to therapy. Resolution of thoracic and abdominal adenopathy. Resolution of splenomegaly. 2. No new or progressive disease. 3. Small bowel containing left inguinal hernia and right-sided hydrocele, as before. 4. Esophageal air fluid level suggests dysmotility or gastroesophageal reflux. 5.  Possible constipation. 6. Suspect hepatic hemangiomas, similar.   10/09/2016 Imaging   No findings suspicious for active lymphomatous involvement.  No abdominopelvic lymphadenopathy.  Spleen is normal in size.  Moderate to large left inguinal/scrotal hernia containing small bowel and fat. No evidence of bowel obstruction.  Additional stable ancillary findings as above.   04/26/2017 Imaging   1. No lymphadenopathy in the chest, abdomen, or pelvis. 2. Mild circumferential bladder wall thickening. Incomplete distention may contribute to this appearance, but bladder infection is also consideration. 3. Stable appearance small hepatic lesions. Imaging features most suggestive of benign cavernous hemangiomas. 4. Stable tiny low-density renal lesions compatible with cysts. 5. Large left groin hernia contains numerous small bowel loops without complicating features. 6. Right-sided hydrocele.   11/15/2017 Imaging    Large recurrent left inguinal hernia with extension of multiple small bowel loops into the inguinal canal and scrotum on the left. Proximal small bowel dilatation is noted consistent with a degree of incarceration.   Bilateral small effusions as well as patchy bibasilar infiltrates.     12/14/2017 Adverse Reaction   Ibrutinib placed on hold   06/17/2020 Cancer Staging   Staging form: Chronic Lymphocytic Leukemia / Small Lymphocytic Lymphoma, AJCC 8th Edition - Clinical stage from 06/17/2020: Modified Rai Stage 0 (Modified Rai risk: Low, Lymphocytosis: Present, Adenopathy: Absent, Organomegaly: Absent, Anemia: Absent, Thrombocytopenia: Absent) - Signed by Heath Lark, MD on 06/17/2020 Stage prefix: Initial diagnosis     PHYSICAL EXAMINATION: ECOG PERFORMANCE STATUS: 1 - Symptomatic but completely ambulatory  Vitals:   11/18/21 1030  BP: (!) 136/36  Pulse: 69  Resp: 18  Temp: 98.1 F (36.7 C)  SpO2: 100%   Filed Weights   11/18/21 1030  Weight: 213 lb 6.4 oz (96.8 kg)    GENERAL:alert, no distress and comfortable NEURO: alert & oriented x 3 with fluent speech, no focal motor/sensory deficits  LABORATORY DATA:  I have reviewed the data as listed    Component Value Date/Time   NA 136 11/18/2021 1009   NA 140 10/09/2016 1106   K 5.0 11/18/2021 1009   K 4.7 10/09/2016 1106  CL 105 11/18/2021 1009   CL 104 03/28/2012 1426   CO2 25 11/18/2021 1009   CO2 28 10/09/2016 1106   GLUCOSE 194 (H) 11/18/2021 1009   GLUCOSE 127 10/09/2016 1106   GLUCOSE 171 (H) 03/28/2012 1426   BUN 24 (H) 11/18/2021 1009   BUN 20.8 10/09/2016 1106   CREATININE 1.44 (H) 11/18/2021 1009   CREATININE 1.15 09/06/2018 1016   CREATININE 1.1 10/09/2016 1106   CALCIUM 8.3 (L) 11/18/2021 1009   CALCIUM 9.1 10/09/2016 1106   PROT 6.3 (L) 11/18/2021 1009   PROT 5.8 (L) 10/09/2016 1106   ALBUMIN 3.5 11/18/2021 1009   ALBUMIN 3.1 (L) 10/09/2016 1106   AST 14 (L) 11/18/2021 1009   AST 18 09/06/2018 1016    AST 17 10/09/2016 1106   ALT 10 11/18/2021 1009   ALT 12 09/06/2018 1016   ALT 8 10/09/2016 1106   ALKPHOS 45 11/18/2021 1009   ALKPHOS 46 10/09/2016 1106   BILITOT 0.4 11/18/2021 1009   BILITOT 0.3 09/06/2018 1016   BILITOT 0.23 10/09/2016 1106   GFRNONAA 46 (L) 11/18/2021 1009   GFRNONAA 57 (L) 09/06/2018 1016   GFRAA 59 (L) 09/12/2019 1034   GFRAA >60 09/06/2018 1016    No results found for: "SPEP", "UPEP"  Lab Results  Component Value Date   WBC 8.6 11/18/2021   NEUTROABS 3.5 11/18/2021   HGB 9.7 (L) 11/18/2021   HCT 29.7 (L) 11/18/2021   MCV 89.2 11/18/2021   PLT 285 11/18/2021      Chemistry      Component Value Date/Time   NA 136 11/18/2021 1009   NA 140 10/09/2016 1106   K 5.0 11/18/2021 1009   K 4.7 10/09/2016 1106   CL 105 11/18/2021 1009   CL 104 03/28/2012 1426   CO2 25 11/18/2021 1009   CO2 28 10/09/2016 1106   BUN 24 (H) 11/18/2021 1009   BUN 20.8 10/09/2016 1106   CREATININE 1.44 (H) 11/18/2021 1009   CREATININE 1.15 09/06/2018 1016   CREATININE 1.1 10/09/2016 1106      Component Value Date/Time   CALCIUM 8.3 (L) 11/18/2021 1009   CALCIUM 9.1 10/09/2016 1106   ALKPHOS 45 11/18/2021 1009   ALKPHOS 46 10/09/2016 1106   AST 14 (L) 11/18/2021 1009   AST 18 09/06/2018 1016   AST 17 10/09/2016 1106   ALT 10 11/18/2021 1009   ALT 12 09/06/2018 1016   ALT 8 10/09/2016 1106   BILITOT 0.4 11/18/2021 1009   BILITOT 0.3 09/06/2018 1016   BILITOT 0.23 10/09/2016 1106

## 2021-11-18 NOTE — Assessment & Plan Note (Signed)
He has intermittent elevated serum creatinine We discussed importance of adequate hydration and risk factor modification

## 2021-11-18 NOTE — Assessment & Plan Note (Signed)
Anemia is stable It is likely related to chronic kidney disease  Observe only

## 2021-11-18 NOTE — Assessment & Plan Note (Signed)
He tolerated treatment very well Lymphocytosis is resolved His hemoglobin is stable He has been on ibrutinib for almost 6 years We discussed the risk and benefits of discontinuation of treatment and he is interested I recommend CT imaging next week for further follow-up before we discontinue treatment and he is in agreement

## 2021-11-25 ENCOUNTER — Ambulatory Visit (HOSPITAL_COMMUNITY)
Admission: RE | Admit: 2021-11-25 | Discharge: 2021-11-25 | Disposition: A | Discharge status: Home / Self Care | Payer: Medicare HMO | Source: Ambulatory Visit | Attending: Hematology and Oncology | Admitting: Hematology and Oncology

## 2021-11-25 DIAGNOSIS — K4090 Unilateral inguinal hernia, without obstruction or gangrene, not specified as recurrent: Secondary | ICD-10-CM | POA: Diagnosis not present

## 2021-11-25 DIAGNOSIS — C9110 Chronic lymphocytic leukemia of B-cell type not having achieved remission: Secondary | ICD-10-CM | POA: Diagnosis not present

## 2021-11-25 DIAGNOSIS — K6389 Other specified diseases of intestine: Secondary | ICD-10-CM | POA: Diagnosis not present

## 2021-11-25 DIAGNOSIS — N433 Hydrocele, unspecified: Secondary | ICD-10-CM | POA: Diagnosis not present

## 2021-11-25 DIAGNOSIS — K8020 Calculus of gallbladder without cholecystitis without obstruction: Secondary | ICD-10-CM | POA: Diagnosis not present

## 2021-11-25 MED ORDER — IOHEXOL 300 MG/ML  SOLN
80.0000 mL | Freq: Once | INTRAMUSCULAR | Status: AC | PRN
  Administered 2021-11-25: 80 mL via INTRAVENOUS

## 2021-12-01 ENCOUNTER — Inpatient Hospital Stay: Payer: Medicare HMO | Attending: Hematology and Oncology | Admitting: Hematology and Oncology

## 2021-12-01 ENCOUNTER — Encounter: Payer: Self-pay | Admitting: Hematology and Oncology

## 2021-12-01 ENCOUNTER — Other Ambulatory Visit (HOSPITAL_COMMUNITY): Payer: Self-pay

## 2021-12-01 ENCOUNTER — Other Ambulatory Visit: Payer: Self-pay

## 2021-12-01 VITALS — BP 158/50 | HR 70 | Resp 18 | Ht 68.0 in | Wt 213.8 lb

## 2021-12-01 DIAGNOSIS — C9110 Chronic lymphocytic leukemia of B-cell type not having achieved remission: Secondary | ICD-10-CM

## 2021-12-01 DIAGNOSIS — C9111 Chronic lymphocytic leukemia of B-cell type in remission: Secondary | ICD-10-CM | POA: Diagnosis not present

## 2021-12-01 NOTE — Progress Notes (Signed)
[place] OFFICE PROGRESS NOTE  Patient Care Team: Janie Morning, DO as PCP - General (Family Medicine) Heath Lark, MD as Consulting Physician (Hematology and Oncology)  ASSESSMENT & PLAN:  CLL (chronic lymphocytic leukemia) ([place]) I have reviewed results of CT imaging with the patient He has no evidence of disease The patient has been placed on ibrutinib for over 6 years I recommend discontinuation of ibrutinib I will see him again in 6 months for further follow-up  No orders of the defined types were placed in this encounter.   All questions were answered. The patient knows to call the clinic with any problems, questions or concerns. The total time spent in the appointment was 25 minutes encounter with patients including review of chart and various tests results, discussions about plan of care and coordination of care plan   Heath Lark, MD 12/01/2021 12:58 PM  INTERVAL HISTORY: Please see below for problem oriented charting. he returns for treatment follow-up and review of CT imaging results He is doing well We discussed CT imaging findings and recommendation to discontinue ibrutinib  REVIEW OF SYSTEMS:   Constitutional: Denies fevers, chills or abnormal weight loss Eyes: Denies blurriness of vision Ears, nose, mouth, throat, and face: Denies mucositis or sore throat Respiratory: Denies cough, dyspnea or wheezes Cardiovascular: Denies palpitation, chest discomfort or lower extremity swelling Gastrointestinal:  Denies nausea, heartburn or change in bowel habits Skin: Denies abnormal skin rashes Lymphatics: Denies new lymphadenopathy or easy bruising Neurological:Denies numbness, tingling or new weaknesses Behavioral/Psych: Mood is stable, no new changes  All other systems were reviewed with the patient and are negative.  I have reviewed the past medical history, past surgical history, social history and family history with the patient and they are unchanged  from previous note.  ALLERGIES:  has No Known Allergies.  MEDICATIONS:  Current Outpatient Medications  Medication Sig Dispense Refill   acetaminophen (TYLENOL) 650 MG CR tablet Take 1 tablet (650 mg total) by mouth 2 (two) times daily as needed for pain. 30 tablet 0   aspirin EC 81 MG tablet Take 81 mg by mouth daily.     Cholecalciferol (VITAMIN D3) 1000 units CAPS Take 1,000 Units by mouth 2 (two) times daily.      Cyanocobalamin (VITAMIN B 12 PO) Take 1,000 mcg by mouth daily.     insulin NPH-regular Human (70-30) 100 UNIT/ML injection Inject 35 Units into the skin 2 (two) times daily.     mirtazapine (REMERON) 15 MG tablet Take 1 tablet (15 mg total) by mouth at bedtime.     Multiple Vitamin (MULTIVITAMIN WITH MINERALS) TABS tablet Take 1 tablet by mouth daily.     pantoprazole (PROTONIX) 40 MG tablet Take 40 mg by mouth daily.     Polyethyl Glycol-Propyl Glycol (SYSTANE OP) Place 1 drop into both eyes 2 (two) times daily.     polyethylene glycol (MIRALAX / GLYCOLAX) packet Take 17 g by mouth daily. 14 each 0   ramipril (ALTACE) 5 MG capsule Take 5 mg by mouth daily.     tamsulosin (FLOMAX) 0.4 MG CAPS capsule Take 0.4 mg by mouth daily.     torsemide (DEMADEX) 20 MG tablet Take 20 mg by mouth daily.     No current facility-administered medications for this visit.    SUMMARY OF ONCOLOGIC HISTORY: Oncology History  CLL (chronic lymphocytic leukemia) ([place])  04/03/2008 Pathology Results   Case #: [id number] FLow cytomtry confirmed CLL   10/11/2014 Tumor Marker   FISH  analysis showed trisomy 12 abnormality   04/29/2015 - 12/01/2021 Chemotherapy   He started taking Ibrutinib   04/29/2015 Imaging   Ct scan showed mild lymphadenopathy and splenomegaly   05/13/2015 Adverse Reaction   Due to neutropenia, dose of Ibrutinib is reduced to 280 mg daily   01/08/2016 Imaging   Response to therapy. Resolution of thoracic and abdominal adenopathy. Resolution of splenomegaly. 2. No new or  progressive disease. 3. Small bowel containing left inguinal hernia and right-sided hydrocele, as before. 4. Esophageal air fluid level suggests dysmotility or gastroesophageal reflux. 5.  Possible constipation. 6. Suspect hepatic hemangiomas, similar.   10/09/2016 Imaging   No findings suspicious for active lymphomatous involvement.  No abdominopelvic lymphadenopathy.  Spleen is normal in size.  Moderate to large left inguinal/scrotal hernia containing small bowel and fat. No evidence of bowel obstruction.  Additional stable ancillary findings as above.   04/26/2017 Imaging   1. No lymphadenopathy in the chest, abdomen, or pelvis. 2. Mild circumferential bladder wall thickening. Incomplete distention may contribute to this appearance, but bladder infection is also consideration. 3. Stable appearance small hepatic lesions. Imaging features most suggestive of benign cavernous hemangiomas. 4. Stable tiny low-density renal lesions compatible with cysts. 5. Large left groin hernia contains numerous small bowel loops without complicating features. 6. Right-sided hydrocele.   11/15/2017 Imaging   Large recurrent left inguinal hernia with extension of multiple small bowel loops into the inguinal canal and scrotum on the left. Proximal small bowel dilatation is noted consistent with a degree of incarceration.   Bilateral small effusions as well as patchy bibasilar infiltrates.     12/14/2017 Adverse Reaction   Ibrutinib placed on hold   06/17/2020 Cancer Staging   Staging form: Chronic Lymphocytic Leukemia / Small Lymphocytic Lymphoma, AJCC 8th Edition - Clinical stage from 06/17/2020: Modified Rai Stage 0 (Modified Rai risk: Low, Lymphocytosis: Present, Adenopathy: Absent, Organomegaly: Absent, Anemia: Absent, Thrombocytopenia: Absent) - Signed by Heath Lark, MD on 06/17/2020 Stage prefix: Initial diagnosis   11/27/2021 Imaging   Chest Impression:   1. No lymphadenopathy. 2. No pulmonary  parenchymal findings   Abdomen / Pelvis Impression:   1. No evidence of lymphoma/lymphadenopathy in the abdomen pelvis. 2. Normal spleen. 3. Short segment of small bowel enters a RIGHT inguinal hernia. 4. RIGHT hydrocele .     PHYSICAL EXAMINATION: ECOG PERFORMANCE STATUS: 1 - Symptomatic but completely ambulatory  Vitals:   12/01/21 1232  BP: (!) 158/50  Pulse: 70  Resp: 18  SpO2: 100%   Filed Weights   12/01/21 1232  Weight: 213 lb 12.8 oz (97 kg)    GENERAL:alert, no distress and comfortable NEURO: alert & oriented x 3 with fluent speech, no focal motor/sensory deficits  LABORATORY DATA:  I have reviewed the data as listed    Component Value Date/Time   NA 136 11/18/2021 1009   NA 140 10/09/2016 1106   K 5.0 11/18/2021 1009   K 4.7 10/09/2016 1106   CL 105 11/18/2021 1009   CL 104 03/28/2012 1426   CO2 25 11/18/2021 1009   CO2 28 10/09/2016 1106   GLUCOSE 194 (H) 11/18/2021 1009   GLUCOSE 127 10/09/2016 1106   GLUCOSE 171 (H) 03/28/2012 1426   BUN 24 (H) 11/18/2021 1009   BUN 20.8 10/09/2016 1106   CREATININE 1.44 (H) 11/18/2021 1009   CREATININE 1.15 09/06/2018 1016   CREATININE 1.1 10/09/2016 1106   CALCIUM 8.3 (L) 11/18/2021 1009   CALCIUM 9.1 10/09/2016 1106   PROT  6.3 (L) 11/18/2021 1009   PROT 5.8 (L) 10/09/2016 1106   ALBUMIN 3.5 11/18/2021 1009   ALBUMIN 3.1 (L) 10/09/2016 1106   AST 14 (L) 11/18/2021 1009   AST 18 09/06/2018 1016   AST 17 10/09/2016 1106   ALT 10 11/18/2021 1009   ALT 12 09/06/2018 1016   ALT 8 10/09/2016 1106   ALKPHOS 45 11/18/2021 1009   ALKPHOS 46 10/09/2016 1106   BILITOT 0.4 11/18/2021 1009   BILITOT 0.3 09/06/2018 1016   BILITOT 0.23 10/09/2016 1106   GFRNONAA 46 (L) 11/18/2021 1009   GFRNONAA 57 (L) 09/06/2018 1016   GFRAA 59 (L) 09/12/2019 1034   GFRAA >60 09/06/2018 1016    No results found for: "SPEP", "UPEP"  Lab Results  Component Value Date   WBC 8.6 11/18/2021   NEUTROABS 3.5 11/18/2021   HGB 9.7  (L) 11/18/2021   HCT 29.7 (L) 11/18/2021   MCV 89.2 11/18/2021   PLT 285 11/18/2021      Chemistry      Component Value Date/Time   NA 136 11/18/2021 1009   NA 140 10/09/2016 1106   K 5.0 11/18/2021 1009   K 4.7 10/09/2016 1106   CL 105 11/18/2021 1009   CL 104 03/28/2012 1426   CO2 25 11/18/2021 1009   CO2 28 10/09/2016 1106   BUN 24 (H) 11/18/2021 1009   BUN 20.8 10/09/2016 1106   CREATININE 1.44 (H) 11/18/2021 1009   CREATININE 1.15 09/06/2018 1016   CREATININE 1.1 10/09/2016 1106      Component Value Date/Time   CALCIUM 8.3 (L) 11/18/2021 1009   CALCIUM 9.1 10/09/2016 1106   ALKPHOS 45 11/18/2021 1009   ALKPHOS 46 10/09/2016 1106   AST 14 (L) 11/18/2021 1009   AST 18 09/06/2018 1016   AST 17 10/09/2016 1106   ALT 10 11/18/2021 1009   ALT 12 09/06/2018 1016   ALT 8 10/09/2016 1106   BILITOT 0.4 11/18/2021 1009   BILITOT 0.3 09/06/2018 1016   BILITOT 0.23 10/09/2016 1106       RADIOGRAPHIC STUDIES: I have personally reviewed the radiological images as listed and agreed with the findings in the report. CT CHEST ABDOMEN PELVIS W CONTRAST  Result Date: 11/27/2021 CLINICAL DATA:  Hematologic malignancy. Assess treatment response. * Tracking Code: BO * CLL EXAM: CT CHEST, ABDOMEN, AND PELVIS WITH CONTRAST TECHNIQUE: Multidetector CT imaging of the chest, abdomen and pelvis was performed following the standard protocol during bolus administration of intravenous contrast. RADIATION DOSE REDUCTION: This exam was performed according to the departmental dose-optimization program which includes automated exposure control, adjustment of the mA and/or kV according to patient size and/or use of iterative reconstruction technique. CONTRAST:  55m OMNIPAQUE IOHEXOL 300 MG/ML  SOLN COMPARISON:  CT 11/15/2017 FINDINGS: CT CHEST FINDINGS Cardiovascular: No significant vascular findings. Normal heart size. No pericardial effusion. Mediastinum/Nodes: No axillary or supraclavicular  adenopathy. No mediastinal or hilar adenopathy. No pericardial fluid. Esophagus normal. Lungs/Pleura: No suspicious pulmonary nodules. Normal pleural. Airways normal. Musculoskeletal: No aggressive osseous lesion. CT ABDOMEN AND PELVIS FINDINGS Hepatobiliary: No focal hepatic lesion. No biliary ductal dilatation. 15 mm gallstone within collapsed gallbladder. Common bile duct is normal. Pancreas: Pancreas is normal. No ductal dilatation. No pancreatic inflammation. Spleen: Normal spleen Adrenals/urinary tract: Adrenal glands and kidneys are normal. The ureters and bladder normal. Stomach/Bowel: Small bowel anastomosis in the mid abdomen. No bowel obstruction. Loop of small bowel does enter a RIGHT inguinal hernia. Normal colon. Loop of small bowel enters  the RIGHT inguinal canal. No evidence of bowel obstruction Vascular/Lymphatic: Abdominal aorta is normal caliber. There is no retroperitoneal or periportal lymphadenopathy. No pelvic lymphadenopathy. Reproductive: RIGHT scrotal hydrocele. Other: Subcutaneous thickening in the RIGHT flank adjacent to the iliac crest may relate to hypodermic injection or other benign cellulitis. Musculoskeletal: No aggressive osseous lesion. IMPRESSION: Chest Impression: 1. No lymphadenopathy. 2. No pulmonary parenchymal findings Abdomen / Pelvis Impression: 1. No evidence of lymphoma/lymphadenopathy in the abdomen pelvis. 2. Normal spleen. 3. Short segment of small bowel enters a RIGHT inguinal hernia. 4. RIGHT hydrocele . Electronically Signed   By: Suzy Bouchard M.D.   On: 11/27/2021 11:14

## 2021-12-01 NOTE — Assessment & Plan Note (Signed)
I have reviewed results of CT imaging with the patient He has no evidence of disease The patient has been placed on ibrutinib for over 6 years I recommend discontinuation of ibrutinib I will see him again in 6 months for further follow-up

## 2022-01-06 ENCOUNTER — Encounter (INDEPENDENT_AMBULATORY_CARE_PROVIDER_SITE_OTHER): Payer: Medicare HMO | Admitting: Ophthalmology

## 2022-01-13 DIAGNOSIS — M5416 Radiculopathy, lumbar region: Secondary | ICD-10-CM | POA: Diagnosis not present

## 2022-01-13 DIAGNOSIS — Z79891 Long term (current) use of opiate analgesic: Secondary | ICD-10-CM | POA: Diagnosis not present

## 2022-01-13 DIAGNOSIS — M5459 Other low back pain: Secondary | ICD-10-CM | POA: Diagnosis not present

## 2022-02-11 DIAGNOSIS — R300 Dysuria: Secondary | ICD-10-CM | POA: Diagnosis not present

## 2022-02-11 DIAGNOSIS — I10 Essential (primary) hypertension: Secondary | ICD-10-CM | POA: Diagnosis not present

## 2022-02-11 DIAGNOSIS — Z794 Long term (current) use of insulin: Secondary | ICD-10-CM | POA: Diagnosis not present

## 2022-02-11 DIAGNOSIS — E785 Hyperlipidemia, unspecified: Secondary | ICD-10-CM | POA: Diagnosis not present

## 2022-02-11 DIAGNOSIS — E1142 Type 2 diabetes mellitus with diabetic polyneuropathy: Secondary | ICD-10-CM | POA: Diagnosis not present

## 2022-02-18 ENCOUNTER — Emergency Department (HOSPITAL_COMMUNITY): Payer: Medicare HMO

## 2022-02-18 ENCOUNTER — Other Ambulatory Visit: Payer: Self-pay

## 2022-02-18 ENCOUNTER — Inpatient Hospital Stay (HOSPITAL_COMMUNITY)
Admission: EM | Admit: 2022-02-18 | Discharge: 2022-02-22 | DRG: 291 | Disposition: A | Discharge status: Home / Self Care | Payer: Medicare HMO | Source: Ambulatory Visit | Attending: Family Medicine | Admitting: Family Medicine

## 2022-02-18 ENCOUNTER — Encounter (HOSPITAL_COMMUNITY): Payer: Self-pay

## 2022-02-18 DIAGNOSIS — K219 Gastro-esophageal reflux disease without esophagitis: Secondary | ICD-10-CM | POA: Diagnosis present

## 2022-02-18 DIAGNOSIS — I890 Lymphedema, not elsewhere classified: Secondary | ICD-10-CM | POA: Diagnosis not present

## 2022-02-18 DIAGNOSIS — E440 Moderate protein-calorie malnutrition: Secondary | ICD-10-CM | POA: Diagnosis present

## 2022-02-18 DIAGNOSIS — E1142 Type 2 diabetes mellitus with diabetic polyneuropathy: Secondary | ICD-10-CM | POA: Diagnosis not present

## 2022-02-18 DIAGNOSIS — I130 Hypertensive heart and chronic kidney disease with heart failure and stage 1 through stage 4 chronic kidney disease, or unspecified chronic kidney disease: Secondary | ICD-10-CM | POA: Diagnosis not present

## 2022-02-18 DIAGNOSIS — N1831 Chronic kidney disease, stage 3a: Secondary | ICD-10-CM | POA: Diagnosis present

## 2022-02-18 DIAGNOSIS — S0240CA Maxillary fracture, right side, initial encounter for closed fracture: Secondary | ICD-10-CM | POA: Diagnosis not present

## 2022-02-18 DIAGNOSIS — R0689 Other abnormalities of breathing: Secondary | ICD-10-CM | POA: Diagnosis not present

## 2022-02-18 DIAGNOSIS — I509 Heart failure, unspecified: Secondary | ICD-10-CM | POA: Diagnosis not present

## 2022-02-18 DIAGNOSIS — I10 Essential (primary) hypertension: Secondary | ICD-10-CM | POA: Diagnosis present

## 2022-02-18 DIAGNOSIS — W1830XA Fall on same level, unspecified, initial encounter: Secondary | ICD-10-CM | POA: Diagnosis present

## 2022-02-18 DIAGNOSIS — R079 Chest pain, unspecified: Secondary | ICD-10-CM | POA: Diagnosis not present

## 2022-02-18 DIAGNOSIS — M542 Cervicalgia: Secondary | ICD-10-CM | POA: Diagnosis not present

## 2022-02-18 DIAGNOSIS — Z794 Long term (current) use of insulin: Secondary | ICD-10-CM

## 2022-02-18 DIAGNOSIS — D649 Anemia, unspecified: Secondary | ICD-10-CM | POA: Diagnosis not present

## 2022-02-18 DIAGNOSIS — L03116 Cellulitis of left lower limb: Secondary | ICD-10-CM | POA: Diagnosis not present

## 2022-02-18 DIAGNOSIS — E1122 Type 2 diabetes mellitus with diabetic chronic kidney disease: Secondary | ICD-10-CM | POA: Diagnosis present

## 2022-02-18 DIAGNOSIS — C9190 Lymphoid leukemia, unspecified not having achieved remission: Secondary | ICD-10-CM | POA: Diagnosis not present

## 2022-02-18 DIAGNOSIS — R55 Syncope and collapse: Secondary | ICD-10-CM | POA: Diagnosis not present

## 2022-02-18 DIAGNOSIS — Z87891 Personal history of nicotine dependence: Secondary | ICD-10-CM

## 2022-02-18 DIAGNOSIS — Z743 Need for continuous supervision: Secondary | ICD-10-CM | POA: Diagnosis not present

## 2022-02-18 DIAGNOSIS — L89623 Pressure ulcer of left heel, stage 3: Secondary | ICD-10-CM | POA: Diagnosis present

## 2022-02-18 DIAGNOSIS — R58 Hemorrhage, not elsewhere classified: Secondary | ICD-10-CM | POA: Diagnosis not present

## 2022-02-18 DIAGNOSIS — R102 Pelvic and perineal pain: Secondary | ICD-10-CM | POA: Diagnosis not present

## 2022-02-18 DIAGNOSIS — D500 Iron deficiency anemia secondary to blood loss (chronic): Secondary | ICD-10-CM | POA: Diagnosis present

## 2022-02-18 DIAGNOSIS — Z6835 Body mass index (BMI) 35.0-35.9, adult: Secondary | ICD-10-CM

## 2022-02-18 DIAGNOSIS — C9111 Chronic lymphocytic leukemia of B-cell type in remission: Secondary | ICD-10-CM | POA: Diagnosis present

## 2022-02-18 DIAGNOSIS — Z833 Family history of diabetes mellitus: Secondary | ICD-10-CM

## 2022-02-18 DIAGNOSIS — Z79899 Other long term (current) drug therapy: Secondary | ICD-10-CM

## 2022-02-18 DIAGNOSIS — Z809 Family history of malignant neoplasm, unspecified: Secondary | ICD-10-CM

## 2022-02-18 DIAGNOSIS — C9110 Chronic lymphocytic leukemia of B-cell type not having achieved remission: Secondary | ICD-10-CM | POA: Diagnosis present

## 2022-02-18 DIAGNOSIS — I5033 Acute on chronic diastolic (congestive) heart failure: Secondary | ICD-10-CM | POA: Diagnosis present

## 2022-02-18 DIAGNOSIS — R0602 Shortness of breath: Secondary | ICD-10-CM | POA: Diagnosis not present

## 2022-02-18 DIAGNOSIS — Z8249 Family history of ischemic heart disease and other diseases of the circulatory system: Secondary | ICD-10-CM

## 2022-02-18 DIAGNOSIS — E669 Obesity, unspecified: Secondary | ICD-10-CM | POA: Insufficient documentation

## 2022-02-18 DIAGNOSIS — Z7982 Long term (current) use of aspirin: Secondary | ICD-10-CM

## 2022-02-18 DIAGNOSIS — D630 Anemia in neoplastic disease: Secondary | ICD-10-CM | POA: Diagnosis present

## 2022-02-18 DIAGNOSIS — I110 Hypertensive heart disease with heart failure: Secondary | ICD-10-CM | POA: Diagnosis not present

## 2022-02-18 DIAGNOSIS — E119 Type 2 diabetes mellitus without complications: Secondary | ICD-10-CM

## 2022-02-18 DIAGNOSIS — S02401A Maxillary fracture, unspecified, initial encounter for closed fracture: Secondary | ICD-10-CM | POA: Diagnosis present

## 2022-02-18 DIAGNOSIS — W19XXXA Unspecified fall, initial encounter: Secondary | ICD-10-CM | POA: Diagnosis not present

## 2022-02-18 LAB — CBC
HCT: 29 % — ABNORMAL LOW (ref 39.0–52.0)
Hemoglobin: 8.6 g/dL — ABNORMAL LOW (ref 13.0–17.0)
MCH: 26.1 pg (ref 26.0–34.0)
MCHC: 29.7 g/dL — ABNORMAL LOW (ref 30.0–36.0)
MCV: 87.9 fL (ref 80.0–100.0)
Platelets: 136 10*3/uL — ABNORMAL LOW (ref 150–400)
RBC: 3.3 MIL/uL — ABNORMAL LOW (ref 4.22–5.81)
RDW: 15.1 % (ref 11.5–15.5)
WBC: 6.5 10*3/uL (ref 4.0–10.5)
nRBC: 0 % (ref 0.0–0.2)

## 2022-02-18 LAB — BASIC METABOLIC PANEL
Anion gap: 11 (ref 5–15)
BUN: 27 mg/dL — ABNORMAL HIGH (ref 8–23)
CO2: 23 mmol/L (ref 22–32)
Calcium: 8.3 mg/dL — ABNORMAL LOW (ref 8.9–10.3)
Chloride: 104 mmol/L (ref 98–111)
Creatinine, Ser: 1.27 mg/dL — ABNORMAL HIGH (ref 0.61–1.24)
GFR, Estimated: 54 mL/min — ABNORMAL LOW (ref >60)
Glucose, Bld: 161 mg/dL — ABNORMAL HIGH (ref 70–99)
Potassium: 5 mmol/L (ref 3.5–5.1)
Sodium: 138 mmol/L (ref 135–145)

## 2022-02-18 LAB — URINALYSIS, ROUTINE W REFLEX MICROSCOPIC
Bacteria, UA: NONE SEEN
Bilirubin Urine: NEGATIVE
Glucose, UA: NEGATIVE mg/dL
Ketones, ur: NEGATIVE mg/dL
Leukocytes,Ua: NEGATIVE
Nitrite: NEGATIVE
Protein, ur: NEGATIVE mg/dL
Specific Gravity, Urine: 1.011 (ref 1.005–1.030)
pH: 6 (ref 5.0–8.0)

## 2022-02-18 LAB — TROPONIN I (HIGH SENSITIVITY): Troponin I (High Sensitivity): 18 ng/L — ABNORMAL HIGH (ref <18)

## 2022-02-18 LAB — BRAIN NATRIURETIC PEPTIDE: B Natriuretic Peptide: 622.1 pg/mL — ABNORMAL HIGH (ref 0.0–100.0)

## 2022-02-18 LAB — CBG MONITORING, ED: Glucose-Capillary: 151 mg/dL — ABNORMAL HIGH (ref 70–99)

## 2022-02-18 MED ORDER — MORPHINE SULFATE (PF) 2 MG/ML IV SOLN
2.0000 mg | Freq: Once | INTRAVENOUS | Status: AC
  Administered 2022-02-18: 2 mg via INTRAVENOUS
  Filled 2022-02-18: qty 1

## 2022-02-18 MED ORDER — FUROSEMIDE 10 MG/ML IJ SOLN
40.0000 mg | Freq: Once | INTRAMUSCULAR | Status: AC
  Administered 2022-02-19: 40 mg via INTRAVENOUS
  Filled 2022-02-18: qty 4

## 2022-02-18 NOTE — ED Provider Notes (Signed)
[facility] Provider Note   CSN: [id number] Arrival date & time: 02/18/22  1543     History  Chief Complaint  Patient presents with   Loss of Consciousness   Facial Injury    Michael [name] is a 89 y.o. male with past medical history significant for CLL, anemia, thrombocytopenia, CKD, diabetes, CHF, lymphedema who presents with concern for syncopal episode causing traumatic injury to the face after fall earlier today. Patient was at his PCPs office earlier today, he had received Rocephin shot for possible developing cellulitis, additionally had been placed on increased Lasix dose secondary to weight gain of around 13 pounds over 2 months, increased shortness of breath with exertion, he had a sudden syncopal episode while walking down the hallway and face planted.  He reports some generalized pain throughout the body, bleeding from face, and missing teeth.   Loss of Consciousness Facial Injury      Home Medications Prior to Admission medications   Medication Sig Start Date End Date Taking? Authorizing Provider  acetaminophen (TYLENOL) 650 MG CR tablet Take 1 tablet (650 mg total) by mouth 2 (two) times daily as needed for pain. 12/22/17   Florencia Reasons, MD  aspirin EC 81 MG tablet Take 81 mg by mouth daily.    [provider]  Cholecalciferol (VITAMIN D3) 1000 units CAPS Take 1,000 Units by mouth 2 (two) times daily.     [provider]  Cyanocobalamin (VITAMIN B 12 PO) Take 1,000 mcg by mouth daily.    [provider]  insulin NPH-regular Human (70-30) 100 UNIT/ML injection Inject 35 Units into the skin 2 (two) times daily.    [provider]  mirtazapine (REMERON) 15 MG tablet Take 1 tablet (15 mg total) by mouth at bedtime. 12/22/17   Florencia Reasons, MD  Multiple Vitamin (MULTIVITAMIN WITH MINERALS) TABS tablet Take 1 tablet by mouth daily. 12/23/17   Florencia Reasons, MD  pantoprazole (PROTONIX) 40 MG tablet Take 40  mg by mouth daily. 10/22/19   [provider]  Polyethyl Glycol-Propyl Glycol (SYSTANE OP) Place 1 drop into both eyes 2 (two) times daily.    [provider]  polyethylene glycol (MIRALAX / GLYCOLAX) packet Take 17 g by mouth daily. 12/23/17   Florencia Reasons, MD  ramipril (ALTACE) 5 MG capsule Take 5 mg by mouth daily. 11/27/19   [provider]  tamsulosin (FLOMAX) 0.4 MG CAPS capsule Take 0.4 mg by mouth daily.    [provider]  torsemide (DEMADEX) 20 MG tablet Take 20 mg by mouth daily. 12/11/19   [provider]  insulin aspart protamine-insulin aspart (NOVOLOG 70/30) (70-30) 100 UNIT/ML injection Inject 35 Units into the skin 2 (two) times daily with a meal.   04/07/11  [provider]      Allergies    Patient has no known allergies.    Review of Systems   Review of Systems  Cardiovascular:  Positive for syncope.    Physical Exam Updated Vital Signs BP 132/63   Pulse 73   Temp 98 F (36.7 C) (Oral)   Resp 16   Ht '5\' 7"'$  (1.702 m)   Wt 102.1 kg   SpO2 99%   BMI 35.24 kg/m  Physical Exam Vitals and nursing note reviewed.  Constitutional:      General: He is not in acute distress.    Appearance: Normal appearance.  HENT:     Head: Normocephalic and atraumatic.  Mouth/Throat:     Comments: Significant injury to frontal maxillary region, multiple front teeth missing with roots in place, slow oozing bleeding, and some gum abrasion noted.  Additionally 1 other somewhat loose tooth in the right canine, all of the teeth are stable and in place. Eyes:     General:        Right eye: No discharge.        Left eye: No discharge.  Cardiovascular:     Rate and Rhythm: Normal rate and regular rhythm.     Pulses: Normal pulses.     Heart sounds: No murmur heard.    No friction rub. No gallop.  Pulmonary:     Effort: Pulmonary effort is normal.     Breath sounds: Normal breath sounds.     Comments: Mild crackles at lung  bases Abdominal:     General: Bowel sounds are normal.     Palpations: Abdomen is soft.  Musculoskeletal:     Comments: Patient with significant bilateral lower extremity edema with lymphedematous changes  Skin:    General: Skin is warm and dry.     Capillary Refill: Capillary refill takes less than 2 seconds.  Neurological:     Mental Status: He is alert and oriented to person, place, and time.  Psychiatric:        Mood and Affect: Mood normal.        Behavior: Behavior normal.     ED Results / Procedures / Treatments   Labs (all labs ordered are listed, but only abnormal results are displayed) Labs Reviewed  BASIC METABOLIC PANEL - Abnormal; Notable for the following components:      Result Value   Glucose, Bld 161 (*)    BUN 27 (*)    Creatinine, Ser 1.27 (*)    Calcium 8.3 (*)    GFR, Estimated 54 (*)    All other components within normal limits  CBC - Abnormal; Notable for the following components:   RBC 3.30 (*)    Hemoglobin 8.6 (*)    HCT 29.0 (*)    MCHC 29.7 (*)    Platelets 136 (*)    All other components within normal limits  URINALYSIS, ROUTINE W REFLEX MICROSCOPIC - Abnormal; Notable for the following components:   Hgb urine dipstick SMALL (*)    All other components within normal limits  BRAIN NATRIURETIC PEPTIDE - Abnormal; Notable for the following components:   B Natriuretic Peptide 622.1 (*)    All other components within normal limits  CBG MONITORING, ED - Abnormal; Notable for the following components:   Glucose-Capillary 151 (*)    All other components within normal limits  TROPONIN I (HIGH SENSITIVITY) - Abnormal; Notable for the following components:   Troponin I (High Sensitivity) 18 (*)    All other components within normal limits  CBC  CREATININE, SERUM  BASIC METABOLIC PANEL  MAGNESIUM  HEMOGLOBIN A1C  TROPONIN I (HIGH SENSITIVITY)    EKG EKG Interpretation  Date/Time:  Wednesday February 18 2022 16:03:18 EST Ventricular Rate:   80 PR Interval:  151 QRS Duration: 85 QT Interval:  393 QTC Calculation: 454 R Axis:   38 Text Interpretation: Sinus rhythm Borderline low voltage, extremity leads No significant change since last tracing Confirmed by Blanchie Dessert [phone]) on 02/18/2022 10:44:41 PM  Radiology CT Head Wo Contrast  Result Date: 02/18/2022 CLINICAL DATA:  Syncope, landed on face. EXAM: CT HEAD WITHOUT CONTRAST CT MAXILLOFACIAL WITHOUT CONTRAST CT CERVICAL SPINE WITHOUT  CONTRAST TECHNIQUE: Multidetector CT imaging of the head, cervical spine, and maxillofacial structures were performed using the standard protocol without intravenous contrast. Multiplanar CT image reconstructions of the cervical spine and maxillofacial structures were also generated. RADIATION DOSE REDUCTION: This exam was performed according to the departmental dose-optimization program which includes automated exposure control, adjustment of the mA and/or kV according to patient size and/or use of iterative reconstruction technique. COMPARISON:  CTA head/neck 10/20/2016 FINDINGS: CT HEAD FINDINGS Brain: There is no acute intracranial hemorrhage, extra-axial fluid collection, or acute infarct Parenchymal volume is within normal limits for age. The ventricles are normal in size. Gray-white differentiation is preserved. The pituitary and suprasellar region are normal. There is no mass lesion. There is no mass effect or midline shift. Vascular: No hyperdense vessel or unexpected calcification. Skull: Normal. Negative for fracture or focal lesion. Other: None. CT MAXILLOFACIAL FINDINGS Osseous: There is an acute fracture of the maxilla anteriorly with anterior displacement of the roots of the right lateral incisor and right canine (10-38, 10-35), fracture of the left central incisor, and suspected traumatic extraction of the right central incisor. No other acute facial bone fracture is seen. There is no evidence of mandibular dislocation. There is no  suspicious osseous lesion. Orbits: The globes are intact with bilateral lens implants in place. There is no retrobulbar hematoma. Sinuses: There is mild mucosal thickening in the paranasal sinuses. Soft tissues: Unremarkable Other: There is extensive dental disease primarily involving the maxillary teeth. CT CERVICAL SPINE FINDINGS Alignment: There is no significant antero or retrolisthesis. There is no jumped or perched facet or other evidence of traumatic malalignment. Skull base and vertebrae: Skull base alignment is maintained. Vertebral body heights are preserved. There is diffuse sclerosis in the imaged vertebral bodies, similar to 2018 and nonspecific. There is no focal suspicious osseous lesion. Soft tissues and spinal canal: No prevertebral fluid or swelling. No visible canal hematoma. Disc levels: There is multilevel disc space narrowing and degenerative endplate change, most advanced at C3-C4, C5-C6, and C6-C7. There is no evidence of high-grade spinal canal stenosis. Upper chest: The imaged lung apices are clear. Other: None. IMPRESSION: 1. No acute intracranial pathology. 2. Acute fracture of the maxilla anteriorly with anterior displacement of the roots of the right lateral incisor and right canine, fracture of the left central incisor, and suspected traumatic extraction of the right central incisor. 3. No acute fracture or traumatic malalignment of the cervical spine. Electronically Signed   By: [name] M.D.   On: 02/18/2022 16:54   CT Maxillofacial Wo Contrast  Result Date: 02/18/2022 CLINICAL DATA:  Syncope, landed on face. EXAM: CT HEAD WITHOUT CONTRAST CT MAXILLOFACIAL WITHOUT CONTRAST CT CERVICAL SPINE WITHOUT CONTRAST TECHNIQUE: Multidetector CT imaging of the head, cervical spine, and maxillofacial structures were performed using the standard protocol without intravenous contrast. Multiplanar CT image reconstructions of the cervical spine and maxillofacial structures were also  generated. RADIATION DOSE REDUCTION: This exam was performed according to the departmental dose-optimization program which includes automated exposure control, adjustment of the mA and/or kV according to patient size and/or use of iterative reconstruction technique. COMPARISON:  CTA head/neck 10/20/2016 FINDINGS: CT HEAD FINDINGS Brain: There is no acute intracranial hemorrhage, extra-axial fluid collection, or acute infarct Parenchymal volume is within normal limits for age. The ventricles are normal in size. Gray-white differentiation is preserved. The pituitary and suprasellar region are normal. There is no mass lesion. There is no mass effect or midline shift. Vascular: No hyperdense vessel or unexpected calcification.  Skull: Normal. Negative for fracture or focal lesion. Other: None. CT MAXILLOFACIAL FINDINGS Osseous: There is an acute fracture of the maxilla anteriorly with anterior displacement of the roots of the right lateral incisor and right canine (10-38, 10-35), fracture of the left central incisor, and suspected traumatic extraction of the right central incisor. No other acute facial bone fracture is seen. There is no evidence of mandibular dislocation. There is no suspicious osseous lesion. Orbits: The globes are intact with bilateral lens implants in place. There is no retrobulbar hematoma. Sinuses: There is mild mucosal thickening in the paranasal sinuses. Soft tissues: Unremarkable Other: There is extensive dental disease primarily involving the maxillary teeth. CT CERVICAL SPINE FINDINGS Alignment: There is no significant antero or retrolisthesis. There is no jumped or perched facet or other evidence of traumatic malalignment. Skull base and vertebrae: Skull base alignment is maintained. Vertebral body heights are preserved. There is diffuse sclerosis in the imaged vertebral bodies, similar to 2018 and nonspecific. There is no focal suspicious osseous lesion. Soft tissues and spinal canal: No  prevertebral fluid or swelling. No visible canal hematoma. Disc levels: There is multilevel disc space narrowing and degenerative endplate change, most advanced at C3-C4, C5-C6, and C6-C7. There is no evidence of high-grade spinal canal stenosis. Upper chest: The imaged lung apices are clear. Other: None. IMPRESSION: 1. No acute intracranial pathology. 2. Acute fracture of the maxilla anteriorly with anterior displacement of the roots of the right lateral incisor and right canine, fracture of the left central incisor, and suspected traumatic extraction of the right central incisor. 3. No acute fracture or traumatic malalignment of the cervical spine. Electronically Signed   By: [name] M.D.   On: 02/18/2022 16:54   CT Cervical Spine Wo Contrast  Result Date: 02/18/2022 CLINICAL DATA:  Syncope, landed on face. EXAM: CT HEAD WITHOUT CONTRAST CT MAXILLOFACIAL WITHOUT CONTRAST CT CERVICAL SPINE WITHOUT CONTRAST TECHNIQUE: Multidetector CT imaging of the head, cervical spine, and maxillofacial structures were performed using the standard protocol without intravenous contrast. Multiplanar CT image reconstructions of the cervical spine and maxillofacial structures were also generated. RADIATION DOSE REDUCTION: This exam was performed according to the departmental dose-optimization program which includes automated exposure control, adjustment of the mA and/or kV according to patient size and/or use of iterative reconstruction technique. COMPARISON:  CTA head/neck 10/20/2016 FINDINGS: CT HEAD FINDINGS Brain: There is no acute intracranial hemorrhage, extra-axial fluid collection, or acute infarct Parenchymal volume is within normal limits for age. The ventricles are normal in size. Gray-white differentiation is preserved. The pituitary and suprasellar region are normal. There is no mass lesion. There is no mass effect or midline shift. Vascular: No hyperdense vessel or unexpected calcification. Skull: Normal.  Negative for fracture or focal lesion. Other: None. CT MAXILLOFACIAL FINDINGS Osseous: There is an acute fracture of the maxilla anteriorly with anterior displacement of the roots of the right lateral incisor and right canine (10-38, 10-35), fracture of the left central incisor, and suspected traumatic extraction of the right central incisor. No other acute facial bone fracture is seen. There is no evidence of mandibular dislocation. There is no suspicious osseous lesion. Orbits: The globes are intact with bilateral lens implants in place. There is no retrobulbar hematoma. Sinuses: There is mild mucosal thickening in the paranasal sinuses. Soft tissues: Unremarkable Other: There is extensive dental disease primarily involving the maxillary teeth. CT CERVICAL SPINE FINDINGS Alignment: There is no significant antero or retrolisthesis. There is no jumped or perched facet or other  evidence of traumatic malalignment. Skull base and vertebrae: Skull base alignment is maintained. Vertebral body heights are preserved. There is diffuse sclerosis in the imaged vertebral bodies, similar to 2018 and nonspecific. There is no focal suspicious osseous lesion. Soft tissues and spinal canal: No prevertebral fluid or swelling. No visible canal hematoma. Disc levels: There is multilevel disc space narrowing and degenerative endplate change, most advanced at C3-C4, C5-C6, and C6-C7. There is no evidence of high-grade spinal canal stenosis. Upper chest: The imaged lung apices are clear. Other: None. IMPRESSION: 1. No acute intracranial pathology. 2. Acute fracture of the maxilla anteriorly with anterior displacement of the roots of the right lateral incisor and right canine, fracture of the left central incisor, and suspected traumatic extraction of the right central incisor. 3. No acute fracture or traumatic malalignment of the cervical spine. Electronically Signed   By: [name] M.D.   On: 02/18/2022 16:54   DG Pelvis 1-2  Views  Result Date: 02/18/2022 CLINICAL DATA:  Chest pain syncopal episode EXAM: PELVIS - 1-2 VIEW COMPARISON:  12/21/2017 FINDINGS: SI joint degenerative changes. Pubic symphysis and rami appear intact. No fracture or malalignment IMPRESSION: No acute osseous abnormality Electronically Signed   By: Donavan Foil M.D.   On: 02/18/2022 16:22   DG Chest 1 View  Result Date: 02/18/2022 CLINICAL DATA:  Chest pain EXAM: CHEST  1 VIEW COMPARISON:  11/22/2017 FINDINGS: Suspect trace right pleural effusion. No consolidation or effusion. Normal cardiac size. No pneumothorax. High-riding right humeral head right greater than left humeral heads consistent with rotator cuff disease. Degenerative changes at the right greater than left shoulders. IMPRESSION: Suspect trace right pleural effusion. Electronically Signed   By: Donavan Foil M.D.   On: 02/18/2022 16:21    Procedures Procedures    Medications Ordered in ED Medications  sodium chloride flush (NS) 0.9 % injection 3 mL (has no administration in time range)  sodium chloride flush (NS) 0.9 % injection 3 mL (has no administration in time range)  0.9 %  sodium chloride infusion (has no administration in time range)  acetaminophen (TYLENOL) tablet 650 mg (has no administration in time range)  ondansetron (ZOFRAN) injection 4 mg (has no administration in time range)  heparin injection 5,000 Units (has no administration in time range)  furosemide (LASIX) injection 40 mg (has no administration in time range)  insulin aspart protamine- aspart (NOVOLOG MIX 70/30) injection 35 Units (has no administration in time range)  pantoprazole (PROTONIX) EC tablet 40 mg (has no administration in time range)  ramipril (ALTACE) capsule 5 mg (has no administration in time range)  tamsulosin (FLOMAX) capsule 0.4 mg (has no administration in time range)  insulin aspart (novoLOG) injection 0-15 Units (has no administration in time range)  insulin aspart (novoLOG) injection  0-5 Units (has no administration in time range)  morphine (PF) 2 MG/ML injection 2 mg (2 mg Intravenous Given 02/18/22 2304)  furosemide (LASIX) injection 40 mg (40 mg Intravenous Given 02/19/22 0005)    ED Course/ Medical Decision Making/ A&P                             Medical Decision Making Amount and/or Complexity of Data Reviewed Labs: ordered.  Risk Prescription drug management. Decision regarding hospitalization.   This patient is a 89 y.o. male who presents to the ED for concern of syncope, fall, worsening shortness of breath with exertion, weight gain, expiratory, this involves an extensive  number of treatment options, and is a complaint that carries with it a high risk of complications and morbidity. The emergent differential diagnosis prior to evaluation includes, but is not limited to,  asthma exacerbation, COPD exacerbation, acute upper respiratory infection, acute bronchitis, chronic bronchitis, interstitial lung disease, ARDS, PE, pneumonia, atypical ACS, carbon monoxide poisoning, spontaneous pneumothorax, new CHF vs CHF exacerbation, versus other, traumatic injury, CVA, subarachnoid hemorrhage. This is not an exhaustive differential.   Past Medical History / Co-morbidities / Social History: CLL, anemia, thrombocytopenia, CKD, diabetes, CHF, lymphedema  Additional history: Chart reviewed. Pertinent results include: Reviewed outpatient oncology visits  Physical Exam: Physical exam performed. The pertinent findings include: Patient with significant injury to maxillary region of trauma with several missing teeth, as well as loose tooth which was removed in the emergency department, there is still some roots in place.  He has some slow oozing bleeding but no significant active laceration noted.  Patient with some redness of the nose without step-off, deformity, or epistaxis.  Patient with some crackles at lung bases, no significant tenderness to palpation of the chest  wall.  Lab Tests: I ordered, and personally interpreted labs.  The pertinent results include: CBC notable for slightly worsening anemia from baseline, hemoglobin 8.6 today from baseline around 9.7, no leukocytosis, mild thrombocytopenia, platelets 136.  His BMP is notable for mildly elevated creatinine 1.27, hyperglycemia, glucose 161.  No anion gap, or other electrolyte abnormality.  His BNP is elevated at 622, with other signs of heart failure exacerbation, lower extremity edema, some crackles at lung bases, worsening shortness of breath with exertion.  Urinalysis is unremarkable.   Imaging Studies: I ordered imaging studies including CT head, CT maxillofacial, CT C-spine, plain film radiograph of the pelvis, plain film chest x-ray. I independently visualized and interpreted imaging which showed patient with maxillary fracture, no other intracranial head injury, is a small pleural effusion on the right, no evidence of other focal consolidation in the lungs. I agree with the radiologist interpretation.   Cardiac Monitoring:  The patient was maintained on a cardiac monitor.  My attending physician Dr. Maryan Rued viewed and interpreted the cardiac monitored which showed an underlying rhythm of: NSR, no significant change from last tracing. I agree with this interpretation.   Medications: I ordered medication including Lasix for heart failure exacerbation, morphine for pain. Reevaluation of the patient after these medicines showed that the patient improved. I have reviewed the patients home medicines and have made adjustments as needed.  Consultations Obtained: I requested consultation with the hospitalist, spoke with Dr. Claria Dice,  and discussed lab and imaging findings as well as pertinent plan - they recommend: Admission for heart failure exacerbation, unexplained syncope, new maxillary fracture   Disposition: After consideration of the diagnostic results and the patients response to treatment, I  feel that patient would benefit from admission as discussed above.   I discussed this case with my attending physician Dr. Maryan Rued who cosigned this note including patient's presenting symptoms, physical exam, and planned diagnostics and interventions. Attending physician stated agreement with plan or made changes to plan which were implemented.    Final Clinical Impression(s) / ED Diagnoses Final diagnoses:  Acute on chronic congestive heart failure, unspecified heart failure type [facility])    Rx / DC Orders ED Discharge Orders     None         Dorien Chihuahua 02/19/22 0124    Blanchie Dessert, MD 02/21/22 0006

## 2022-02-18 NOTE — ED Provider Triage Note (Signed)
Emergency Medicine Provider Triage Evaluation Note  Michael [name] , a 89 y.o. male  was evaluated in triage.  Pt complains of syncopal episode at PCP office. Admits to some CP after, but no preceding symptoms. Being seen at PCP office to bilateral lower extremity edema. Admits to seizures when he was younger, but none since. No urinary incontinence. Lost a few teeth from fall. Not on any blood thinners.  Review of Systems  Positive: syncope Negative: fever  Physical Exam  There were no vitals taken for this visit. Gen:   Awake, no distress   Resp:  Normal effort  MSK:   Moves extremities without difficulty  Other:  Missing front teeth. Normal speech. No pronator drift. No facial droop  Medical Decision Making  Medically screening exam initiated at 4:03 PM.  Appropriate orders placed.  Michael [name] was informed that the remainder of the evaluation will be completed by another provider, this initial triage assessment does not replace that evaluation, and the importance of remaining in the ED until their evaluation is complete.  Labs CT scans X-rays   Michael [name] 02/18/22 1606

## 2022-02-18 NOTE — ED Triage Notes (Signed)
Pt arrives via EMS from his PCP's office. Pt was seen for some swelling to bilateral legs. When he was leaving the doctor's office, the patient had syncopal episode and landed face first. EMS states he lost approximately two or three teeth from the fall. A brief loss of consciousness was reported. Pt arrives AxOx4. No blood thinners. Patient is in a c-collar.

## 2022-02-18 NOTE — ED Notes (Signed)
Pt to imaging

## 2022-02-18 NOTE — ED Provider Notes (Incomplete)
[place] EMERGENCY DEPARTMENT AT [facility] Provider Note   CSN: [id number] Arrival date & time: 02/18/22  1543     History {Add pertinent medical, surgical, social history, OB history to HPI:1} Chief Complaint  Patient presents with  . Loss of Consciousness  . Facial Injury    Michael [name] is a 89 y.o. male with past medical history significant for CLL, anemia, thrombocytopenia, CKD, diabetes, CHF, lymphedema who presents with concern for syncopal episode causing traumatic injury to the face after fall earlier today. Patient was at his PCPs office earlier today, he had received Rocephin shot for possible developing cellulitis, additionally had been placed on increased Lasix dose secondary to weight gain of around 13 pounds over 2 months, increased shortness of breath with exertion, he had a sudden syncopal episode while walking down the hallway and face planted.  He reports some generalized pain throughout the body, bleeding from face, and missing teeth.   Loss of Consciousness Facial Injury      Home Medications Prior to Admission medications   Medication Sig Start Date End Date Taking? Authorizing Provider  acetaminophen (TYLENOL) 650 MG CR tablet Take 1 tablet (650 mg total) by mouth 2 (two) times daily as needed for pain. 12/22/17   Florencia Reasons, MD  aspirin EC 81 MG tablet Take 81 mg by mouth daily.    [provider]  Cholecalciferol (VITAMIN D3) 1000 units CAPS Take 1,000 Units by mouth 2 (two) times daily.     [provider]  Cyanocobalamin (VITAMIN B 12 PO) Take 1,000 mcg by mouth daily.    [provider]  insulin NPH-regular Human (70-30) 100 UNIT/ML injection Inject 35 Units into the skin 2 (two) times daily.    [provider]  mirtazapine (REMERON) 15 MG tablet Take 1 tablet (15 mg total) by mouth at bedtime. 12/22/17   Florencia Reasons, MD  Multiple Vitamin (MULTIVITAMIN WITH MINERALS) TABS tablet Take 1 tablet by mouth  daily. 12/23/17   Florencia Reasons, MD  pantoprazole (PROTONIX) 40 MG tablet Take 40 mg by mouth daily. 10/22/19   [provider]  Polyethyl Glycol-Propyl Glycol (SYSTANE OP) Place 1 drop into both eyes 2 (two) times daily.    [provider]  polyethylene glycol (MIRALAX / GLYCOLAX) packet Take 17 g by mouth daily. 12/23/17   Florencia Reasons, MD  ramipril (ALTACE) 5 MG capsule Take 5 mg by mouth daily. 11/27/19   [provider]  tamsulosin (FLOMAX) 0.4 MG CAPS capsule Take 0.4 mg by mouth daily.    [provider]  torsemide (DEMADEX) 20 MG tablet Take 20 mg by mouth daily. 12/11/19   [provider]  insulin aspart protamine-insulin aspart (NOVOLOG 70/30) (70-30) 100 UNIT/ML injection Inject 35 Units into the skin 2 (two) times daily with a meal.   04/07/11  [provider]      Allergies    Patient has no known allergies.    Review of Systems   Review of Systems  Cardiovascular:  Positive for syncope.    Physical Exam Updated Vital Signs BP (!) 163/75 (BP Location: Left Arm)   Pulse 81   Temp 98 F (36.7 C) (Oral)   Resp 18   Ht '5\' 7"'$  (1.702 m)   Wt 102.1 kg   SpO2 100%   BMI 35.24 kg/m  Physical Exam Vitals and nursing note reviewed.  Constitutional:      General: He is not in acute distress.    Appearance:  Normal appearance.  HENT:     Head: Normocephalic and atraumatic.  Eyes:     General:        Right eye: No discharge.        Left eye: No discharge.  Cardiovascular:     Rate and Rhythm: Normal rate and regular rhythm.     Heart sounds: No murmur heard.    No friction rub. No gallop.  Pulmonary:     Effort: Pulmonary effort is normal.     Breath sounds: Normal breath sounds.  Abdominal:     General: Bowel sounds are normal.     Palpations: Abdomen is soft.  Musculoskeletal:     Comments: Patient with significant bilateral lower extremity edema with lymphedematous changes  Skin:    General: Skin is warm and dry.      Capillary Refill: Capillary refill takes less than 2 seconds.  Neurological:     Mental Status: He is alert and oriented to person, place, and time.  Psychiatric:        Mood and Affect: Mood normal.        Behavior: Behavior normal.     ED Results / Procedures / Treatments   Labs (all labs ordered are listed, but only abnormal results are displayed) Labs Reviewed  CBG MONITORING, ED - Abnormal; Notable for the following components:      Result Value   Glucose-Capillary 151 (*)    All other components within normal limits  BASIC METABOLIC PANEL  CBC  URINALYSIS, ROUTINE W REFLEX MICROSCOPIC  BRAIN NATRIURETIC PEPTIDE  TROPONIN I (HIGH SENSITIVITY)  TROPONIN I (HIGH SENSITIVITY)    EKG EKG Interpretation  Date/Time:  Wednesday February 18 2022 16:03:18 EST Ventricular Rate:  80 PR Interval:  151 QRS Duration: 85 QT Interval:  393 QTC Calculation: 454 R Axis:   38 Text Interpretation: Sinus rhythm Borderline low voltage, extremity leads No significant change since last tracing Confirmed by Blanchie Dessert [phone]) on 02/18/2022 10:44:41 PM  Radiology CT Head Wo Contrast  Result Date: 02/18/2022 CLINICAL DATA:  Syncope, landed on face. EXAM: CT HEAD WITHOUT CONTRAST CT MAXILLOFACIAL WITHOUT CONTRAST CT CERVICAL SPINE WITHOUT CONTRAST TECHNIQUE: Multidetector CT imaging of the head, cervical spine, and maxillofacial structures were performed using the standard protocol without intravenous contrast. Multiplanar CT image reconstructions of the cervical spine and maxillofacial structures were also generated. RADIATION DOSE REDUCTION: This exam was performed according to the departmental dose-optimization program which includes automated exposure control, adjustment of the mA and/or kV according to patient size and/or use of iterative reconstruction technique. COMPARISON:  CTA head/neck 10/20/2016 FINDINGS: CT HEAD FINDINGS Brain: There is no acute intracranial hemorrhage, extra-axial  fluid collection, or acute infarct Parenchymal volume is within normal limits for age. The ventricles are normal in size. Gray-white differentiation is preserved. The pituitary and suprasellar region are normal. There is no mass lesion. There is no mass effect or midline shift. Vascular: No hyperdense vessel or unexpected calcification. Skull: Normal. Negative for fracture or focal lesion. Other: None. CT MAXILLOFACIAL FINDINGS Osseous: There is an acute fracture of the maxilla anteriorly with anterior displacement of the roots of the right lateral incisor and right canine (10-38, 10-35), fracture of the left central incisor, and suspected traumatic extraction of the right central incisor. No other acute facial bone fracture is seen. There is no evidence of mandibular dislocation. There is no suspicious osseous lesion. Orbits: The globes are intact with bilateral lens implants in place. There is no retrobulbar hematoma.  Sinuses: There is mild mucosal thickening in the paranasal sinuses. Soft tissues: Unremarkable Other: There is extensive dental disease primarily involving the maxillary teeth. CT CERVICAL SPINE FINDINGS Alignment: There is no significant antero or retrolisthesis. There is no jumped or perched facet or other evidence of traumatic malalignment. Skull base and vertebrae: Skull base alignment is maintained. Vertebral body heights are preserved. There is diffuse sclerosis in the imaged vertebral bodies, similar to 2018 and nonspecific. There is no focal suspicious osseous lesion. Soft tissues and spinal canal: No prevertebral fluid or swelling. No visible canal hematoma. Disc levels: There is multilevel disc space narrowing and degenerative endplate change, most advanced at C3-C4, C5-C6, and C6-C7. There is no evidence of high-grade spinal canal stenosis. Upper chest: The imaged lung apices are clear. Other: None. IMPRESSION: 1. No acute intracranial pathology. 2. Acute fracture of the maxilla anteriorly  with anterior displacement of the roots of the right lateral incisor and right canine, fracture of the left central incisor, and suspected traumatic extraction of the right central incisor. 3. No acute fracture or traumatic malalignment of the cervical spine. Electronically Signed   By: [name] M.D.   On: 02/18/2022 16:54   CT Maxillofacial Wo Contrast  Result Date: 02/18/2022 CLINICAL DATA:  Syncope, landed on face. EXAM: CT HEAD WITHOUT CONTRAST CT MAXILLOFACIAL WITHOUT CONTRAST CT CERVICAL SPINE WITHOUT CONTRAST TECHNIQUE: Multidetector CT imaging of the head, cervical spine, and maxillofacial structures were performed using the standard protocol without intravenous contrast. Multiplanar CT image reconstructions of the cervical spine and maxillofacial structures were also generated. RADIATION DOSE REDUCTION: This exam was performed according to the departmental dose-optimization program which includes automated exposure control, adjustment of the mA and/or kV according to patient size and/or use of iterative reconstruction technique. COMPARISON:  CTA head/neck 10/20/2016 FINDINGS: CT HEAD FINDINGS Brain: There is no acute intracranial hemorrhage, extra-axial fluid collection, or acute infarct Parenchymal volume is within normal limits for age. The ventricles are normal in size. Gray-white differentiation is preserved. The pituitary and suprasellar region are normal. There is no mass lesion. There is no mass effect or midline shift. Vascular: No hyperdense vessel or unexpected calcification. Skull: Normal. Negative for fracture or focal lesion. Other: None. CT MAXILLOFACIAL FINDINGS Osseous: There is an acute fracture of the maxilla anteriorly with anterior displacement of the roots of the right lateral incisor and right canine (10-38, 10-35), fracture of the left central incisor, and suspected traumatic extraction of the right central incisor. No other acute facial bone fracture is seen. There is no  evidence of mandibular dislocation. There is no suspicious osseous lesion. Orbits: The globes are intact with bilateral lens implants in place. There is no retrobulbar hematoma. Sinuses: There is mild mucosal thickening in the paranasal sinuses. Soft tissues: Unremarkable Other: There is extensive dental disease primarily involving the maxillary teeth. CT CERVICAL SPINE FINDINGS Alignment: There is no significant antero or retrolisthesis. There is no jumped or perched facet or other evidence of traumatic malalignment. Skull base and vertebrae: Skull base alignment is maintained. Vertebral body heights are preserved. There is diffuse sclerosis in the imaged vertebral bodies, similar to 2018 and nonspecific. There is no focal suspicious osseous lesion. Soft tissues and spinal canal: No prevertebral fluid or swelling. No visible canal hematoma. Disc levels: There is multilevel disc space narrowing and degenerative endplate change, most advanced at C3-C4, C5-C6, and C6-C7. There is no evidence of high-grade spinal canal stenosis. Upper chest: The imaged lung apices are clear. Other: None. IMPRESSION:  1. No acute intracranial pathology. 2. Acute fracture of the maxilla anteriorly with anterior displacement of the roots of the right lateral incisor and right canine, fracture of the left central incisor, and suspected traumatic extraction of the right central incisor. 3. No acute fracture or traumatic malalignment of the cervical spine. Electronically Signed   By: [name] M.D.   On: 02/18/2022 16:54   CT Cervical Spine Wo Contrast  Result Date: 02/18/2022 CLINICAL DATA:  Syncope, landed on face. EXAM: CT HEAD WITHOUT CONTRAST CT MAXILLOFACIAL WITHOUT CONTRAST CT CERVICAL SPINE WITHOUT CONTRAST TECHNIQUE: Multidetector CT imaging of the head, cervical spine, and maxillofacial structures were performed using the standard protocol without intravenous contrast. Multiplanar CT image reconstructions of the cervical  spine and maxillofacial structures were also generated. RADIATION DOSE REDUCTION: This exam was performed according to the departmental dose-optimization program which includes automated exposure control, adjustment of the mA and/or kV according to patient size and/or use of iterative reconstruction technique. COMPARISON:  CTA head/neck 10/20/2016 FINDINGS: CT HEAD FINDINGS Brain: There is no acute intracranial hemorrhage, extra-axial fluid collection, or acute infarct Parenchymal volume is within normal limits for age. The ventricles are normal in size. Gray-white differentiation is preserved. The pituitary and suprasellar region are normal. There is no mass lesion. There is no mass effect or midline shift. Vascular: No hyperdense vessel or unexpected calcification. Skull: Normal. Negative for fracture or focal lesion. Other: None. CT MAXILLOFACIAL FINDINGS Osseous: There is an acute fracture of the maxilla anteriorly with anterior displacement of the roots of the right lateral incisor and right canine (10-38, 10-35), fracture of the left central incisor, and suspected traumatic extraction of the right central incisor. No other acute facial bone fracture is seen. There is no evidence of mandibular dislocation. There is no suspicious osseous lesion. Orbits: The globes are intact with bilateral lens implants in place. There is no retrobulbar hematoma. Sinuses: There is mild mucosal thickening in the paranasal sinuses. Soft tissues: Unremarkable Other: There is extensive dental disease primarily involving the maxillary teeth. CT CERVICAL SPINE FINDINGS Alignment: There is no significant antero or retrolisthesis. There is no jumped or perched facet or other evidence of traumatic malalignment. Skull base and vertebrae: Skull base alignment is maintained. Vertebral body heights are preserved. There is diffuse sclerosis in the imaged vertebral bodies, similar to 2018 and nonspecific. There is no focal suspicious osseous  lesion. Soft tissues and spinal canal: No prevertebral fluid or swelling. No visible canal hematoma. Disc levels: There is multilevel disc space narrowing and degenerative endplate change, most advanced at C3-C4, C5-C6, and C6-C7. There is no evidence of high-grade spinal canal stenosis. Upper chest: The imaged lung apices are clear. Other: None. IMPRESSION: 1. No acute intracranial pathology. 2. Acute fracture of the maxilla anteriorly with anterior displacement of the roots of the right lateral incisor and right canine, fracture of the left central incisor, and suspected traumatic extraction of the right central incisor. 3. No acute fracture or traumatic malalignment of the cervical spine. Electronically Signed   By: [name] M.D.   On: 02/18/2022 16:54   DG Pelvis 1-2 Views  Result Date: 02/18/2022 CLINICAL DATA:  Chest pain syncopal episode EXAM: PELVIS - 1-2 VIEW COMPARISON:  12/21/2017 FINDINGS: SI joint degenerative changes. Pubic symphysis and rami appear intact. No fracture or malalignment IMPRESSION: No acute osseous abnormality Electronically Signed   By: Donavan Foil M.D.   On: 02/18/2022 16:22   DG Chest 1 View  Result Date: 02/18/2022 CLINICAL  DATA:  Chest pain EXAM: CHEST  1 VIEW COMPARISON:  11/22/2017 FINDINGS: Suspect trace right pleural effusion. No consolidation or effusion. Normal cardiac size. No pneumothorax. High-riding right humeral head right greater than left humeral heads consistent with rotator cuff disease. Degenerative changes at the right greater than left shoulders. IMPRESSION: Suspect trace right pleural effusion. Electronically Signed   By: Donavan Foil M.D.   On: 02/18/2022 16:21    Procedures Procedures  {Document cardiac monitor, telemetry assessment procedure when appropriate:1}  Medications Ordered in ED Medications - No data to display  ED Course/ Medical Decision Making/ A&P   {   Click here for ABCD2, HEART and other calculatorsREFRESH Note before  signing :1}                          Medical Decision Making Amount and/or Complexity of Data Reviewed Labs: ordered.   This patient is a 89 y.o. male who presents to the ED for concern of syncope, fall, worsening shortness of breath with exertion, weight gain, expiratory, this involves an extensive number of treatment options, and is a complaint that carries with it a high risk of complications and morbidity. The emergent differential diagnosis prior to evaluation includes, but is not limited to,  asthma exacerbation, COPD exacerbation, acute upper respiratory infection, acute bronchitis, chronic bronchitis, interstitial lung disease, ARDS, PE, pneumonia, atypical ACS, carbon monoxide poisoning, spontaneous pneumothorax, new CHF vs CHF exacerbation, versus other, traumatic injury, CVA, subarachnoid hemorrhage. This is not an exhaustive differential.   Past Medical History / Co-morbidities / Social History: CLL, anemia, thrombocytopenia, CKD, diabetes, CHF, lymphedema  Additional history: Chart reviewed. Pertinent results include: ***  Physical Exam: Physical exam performed. The pertinent findings include: ***  Lab Tests: I ordered, and personally interpreted labs.  The pertinent results include:  ***   Imaging Studies: I ordered imaging studies including ***. I independently visualized and interpreted imaging which showed ***. I agree with the radiologist interpretation.   Cardiac Monitoring:  The patient was maintained on a cardiac monitor.  My attending physician Dr. Marland Kitchen viewed and interpreted the cardiac monitored which showed an underlying rhythm of: ***. I agree with this interpretation.   Medications: I ordered medication including ***  for ***. Reevaluation of the patient after these medicines showed that the patient {resolved/improved/worsened:23923::"improved"}. I have reviewed the patients home medicines and have made adjustments as needed.  Consultations Obtained: I  requested consultation with the ***,  and discussed lab and imaging findings as well as pertinent plan - they recommend: ***   Disposition: After consideration of the diagnostic results and the patients response to treatment, I feel that *** .   ***emergency department workup does not suggest an emergent condition requiring admission or immediate intervention beyond what has been performed at this time. The plan is: ***. The patient is safe for discharge and has been instructed to return immediately for worsening symptoms, change in symptoms or any other concerns.  I discussed this case with my attending physician Dr. Marland Kitchen who cosigned this note including patient's presenting symptoms, physical exam, and planned diagnostics and interventions. Attending physician stated agreement with plan or made changes to plan which were implemented.    Final Clinical Impression(s) / ED Diagnoses Final diagnoses:  None    Rx / DC Orders ED Discharge Orders     None

## 2022-02-19 ENCOUNTER — Other Ambulatory Visit (HOSPITAL_COMMUNITY): Payer: Medicare HMO

## 2022-02-19 DIAGNOSIS — E8779 Other fluid overload: Secondary | ICD-10-CM | POA: Diagnosis not present

## 2022-02-19 DIAGNOSIS — Z87891 Personal history of nicotine dependence: Secondary | ICD-10-CM | POA: Diagnosis not present

## 2022-02-19 DIAGNOSIS — Z833 Family history of diabetes mellitus: Secondary | ICD-10-CM | POA: Diagnosis not present

## 2022-02-19 DIAGNOSIS — Z6835 Body mass index (BMI) 35.0-35.9, adult: Secondary | ICD-10-CM | POA: Diagnosis not present

## 2022-02-19 DIAGNOSIS — Z7982 Long term (current) use of aspirin: Secondary | ICD-10-CM | POA: Diagnosis not present

## 2022-02-19 DIAGNOSIS — S0240CA Maxillary fracture, right side, initial encounter for closed fracture: Secondary | ICD-10-CM | POA: Diagnosis not present

## 2022-02-19 DIAGNOSIS — I509 Heart failure, unspecified: Secondary | ICD-10-CM | POA: Diagnosis not present

## 2022-02-19 DIAGNOSIS — Z8249 Family history of ischemic heart disease and other diseases of the circulatory system: Secondary | ICD-10-CM | POA: Diagnosis not present

## 2022-02-19 DIAGNOSIS — C9111 Chronic lymphocytic leukemia of B-cell type in remission: Secondary | ICD-10-CM | POA: Diagnosis not present

## 2022-02-19 DIAGNOSIS — S02401A Maxillary fracture, unspecified, initial encounter for closed fracture: Secondary | ICD-10-CM | POA: Diagnosis present

## 2022-02-19 DIAGNOSIS — Z809 Family history of malignant neoplasm, unspecified: Secondary | ICD-10-CM | POA: Diagnosis not present

## 2022-02-19 DIAGNOSIS — D500 Iron deficiency anemia secondary to blood loss (chronic): Secondary | ICD-10-CM | POA: Diagnosis not present

## 2022-02-19 DIAGNOSIS — D630 Anemia in neoplastic disease: Secondary | ICD-10-CM | POA: Diagnosis not present

## 2022-02-19 DIAGNOSIS — I5031 Acute diastolic (congestive) heart failure: Secondary | ICD-10-CM | POA: Diagnosis not present

## 2022-02-19 DIAGNOSIS — S02609A Fracture of mandible, unspecified, initial encounter for closed fracture: Secondary | ICD-10-CM | POA: Diagnosis not present

## 2022-02-19 DIAGNOSIS — W1830XA Fall on same level, unspecified, initial encounter: Secondary | ICD-10-CM | POA: Diagnosis not present

## 2022-02-19 DIAGNOSIS — I130 Hypertensive heart and chronic kidney disease with heart failure and stage 1 through stage 4 chronic kidney disease, or unspecified chronic kidney disease: Secondary | ICD-10-CM | POA: Diagnosis not present

## 2022-02-19 DIAGNOSIS — C9110 Chronic lymphocytic leukemia of B-cell type not having achieved remission: Secondary | ICD-10-CM | POA: Diagnosis not present

## 2022-02-19 DIAGNOSIS — R55 Syncope and collapse: Secondary | ICD-10-CM | POA: Diagnosis not present

## 2022-02-19 DIAGNOSIS — E440 Moderate protein-calorie malnutrition: Secondary | ICD-10-CM | POA: Diagnosis not present

## 2022-02-19 DIAGNOSIS — Z79899 Other long term (current) drug therapy: Secondary | ICD-10-CM | POA: Diagnosis not present

## 2022-02-19 DIAGNOSIS — E1122 Type 2 diabetes mellitus with diabetic chronic kidney disease: Secondary | ICD-10-CM | POA: Diagnosis not present

## 2022-02-19 DIAGNOSIS — I5033 Acute on chronic diastolic (congestive) heart failure: Secondary | ICD-10-CM | POA: Diagnosis not present

## 2022-02-19 DIAGNOSIS — K219 Gastro-esophageal reflux disease without esophagitis: Secondary | ICD-10-CM | POA: Diagnosis not present

## 2022-02-19 DIAGNOSIS — E119 Type 2 diabetes mellitus without complications: Secondary | ICD-10-CM | POA: Diagnosis not present

## 2022-02-19 DIAGNOSIS — E669 Obesity, unspecified: Secondary | ICD-10-CM | POA: Diagnosis not present

## 2022-02-19 DIAGNOSIS — N1831 Chronic kidney disease, stage 3a: Secondary | ICD-10-CM | POA: Insufficient documentation

## 2022-02-19 DIAGNOSIS — L89623 Pressure ulcer of left heel, stage 3: Secondary | ICD-10-CM | POA: Diagnosis not present

## 2022-02-19 DIAGNOSIS — Z794 Long term (current) use of insulin: Secondary | ICD-10-CM | POA: Diagnosis not present

## 2022-02-19 LAB — CBG MONITORING, ED
Glucose-Capillary: 164 mg/dL — ABNORMAL HIGH (ref 70–99)
Glucose-Capillary: 124 mg/dL — ABNORMAL HIGH (ref 70–99)
Glucose-Capillary: 156 mg/dL — ABNORMAL HIGH (ref 70–99)
Glucose-Capillary: 140 mg/dL — ABNORMAL HIGH (ref 70–99)
Glucose-Capillary: 186 mg/dL — ABNORMAL HIGH (ref 70–99)

## 2022-02-19 LAB — TROPONIN I (HIGH SENSITIVITY): Troponin I (High Sensitivity): 15 ng/L (ref <18)

## 2022-02-19 LAB — BASIC METABOLIC PANEL
Anion gap: 8 (ref 5–15)
BUN: 27 mg/dL — ABNORMAL HIGH (ref 8–23)
CO2: 25 mmol/L (ref 22–32)
Calcium: 8.6 mg/dL — ABNORMAL LOW (ref 8.9–10.3)
Chloride: 107 mmol/L (ref 98–111)
Creatinine, Ser: 1.12 mg/dL (ref 0.61–1.24)
GFR, Estimated: >60 mL/min (ref >60)
Glucose, Bld: 137 mg/dL — ABNORMAL HIGH (ref 70–99)
Potassium: 4.8 mmol/L (ref 3.5–5.1)
Sodium: 140 mmol/L (ref 135–145)

## 2022-02-19 LAB — MAGNESIUM: Magnesium: 2 mg/dL (ref 1.7–2.4)

## 2022-02-19 LAB — HEMOGLOBIN A1C
Hgb A1c MFr Bld: 6.5 % — ABNORMAL HIGH (ref 4.8–5.6)
Mean Plasma Glucose: 139.85 mg/dL

## 2022-02-19 MED ORDER — FUROSEMIDE 10 MG/ML IJ SOLN
40.0000 mg | Freq: Two times a day (BID) | INTRAMUSCULAR | Status: DC
  Administered 2022-02-19 – 2022-02-20 (×3): 40 mg via INTRAVENOUS
  Filled 2022-02-19 (×3): qty 4

## 2022-02-19 MED ORDER — TAMSULOSIN HCL 0.4 MG PO CAPS
0.4000 mg | ORAL_CAPSULE | Freq: Every day | ORAL | Status: DC
  Administered 2022-02-19 – 2022-02-22 (×4): 0.4 mg via ORAL
  Filled 2022-02-19 (×4): qty 1

## 2022-02-19 MED ORDER — RAMIPRIL 5 MG PO CAPS
5.0000 mg | ORAL_CAPSULE | Freq: Every day | ORAL | Status: DC
  Administered 2022-02-19 – 2022-02-21 (×3): 5 mg via ORAL
  Filled 2022-02-19 (×4): qty 1

## 2022-02-19 MED ORDER — SODIUM CHLORIDE 0.9% FLUSH: 3.0000 mL | INTRAVENOUS | Status: DC | PRN

## 2022-02-19 MED ORDER — CALCIUM CARBONATE ANTACID 500 MG PO CHEW
400.0000 mg | CHEWABLE_TABLET | Freq: Four times a day (QID) | ORAL | Status: DC | PRN
  Administered 2022-02-19: 400 mg via ORAL
  Filled 2022-02-19: qty 2

## 2022-02-19 MED ORDER — SODIUM CHLORIDE 0.9 % IV SOLN: 250.0000 mL | INTRAVENOUS | Status: DC | PRN

## 2022-02-19 MED ORDER — ONDANSETRON HCL 4 MG/2ML IJ SOLN: 4.0000 mg | Freq: Four times a day (QID) | INTRAMUSCULAR | Status: DC | PRN

## 2022-02-19 MED ORDER — INSULIN ASPART 100 UNIT/ML IJ SOLN
0.0000 [IU] | Freq: Three times a day (TID) | INTRAMUSCULAR | Status: DC
  Administered 2022-02-19: 2 [IU] via SUBCUTANEOUS
  Administered 2022-02-19 – 2022-02-20 (×2): 3 [IU] via SUBCUTANEOUS
  Administered 2022-02-20: 5 [IU] via SUBCUTANEOUS
  Administered 2022-02-20 – 2022-02-21 (×3): 3 [IU] via SUBCUTANEOUS
  Filled 2022-02-19: qty 0.15

## 2022-02-19 MED ORDER — INSULIN ASPART 100 UNIT/ML IJ SOLN
0.0000 [IU] | Freq: Every day | INTRAMUSCULAR | Status: DC
  Administered 2022-02-20: 2 [IU] via SUBCUTANEOUS
  Filled 2022-02-19: qty 0.05

## 2022-02-19 MED ORDER — PANTOPRAZOLE SODIUM 40 MG PO TBEC
40.0000 mg | DELAYED_RELEASE_TABLET | Freq: Every day | ORAL | Status: DC
  Administered 2022-02-19 – 2022-02-22 (×4): 40 mg via ORAL
  Filled 2022-02-19 (×4): qty 1

## 2022-02-19 MED ORDER — HEPARIN SODIUM (PORCINE) 5000 UNIT/ML IJ SOLN
5000.0000 [IU] | Freq: Three times a day (TID) | INTRAMUSCULAR | Status: DC
  Administered 2022-02-19 – 2022-02-22 (×10): 5000 [IU] via SUBCUTANEOUS
  Filled 2022-02-19 (×11): qty 1

## 2022-02-19 MED ORDER — INSULIN ASPART PROT & ASPART (70-30 MIX) 100 UNIT/ML ~~LOC~~ SUSP
35.0000 [IU] | Freq: Two times a day (BID) | SUBCUTANEOUS | Status: DC
  Administered 2022-02-20: 35 [IU] via SUBCUTANEOUS
  Filled 2022-02-19 (×2): qty 10

## 2022-02-19 MED ORDER — SODIUM CHLORIDE 0.9% FLUSH
3.0000 mL | Freq: Two times a day (BID) | INTRAVENOUS | Status: DC
  Administered 2022-02-19 – 2022-02-21 (×5): 3 mL via INTRAVENOUS

## 2022-02-19 MED ORDER — OXYCODONE HCL 5 MG PO TABS
5.0000 mg | ORAL_TABLET | Freq: Once | ORAL | Status: AC
  Administered 2022-02-19: 5 mg via ORAL
  Filled 2022-02-19: qty 1

## 2022-02-19 MED ORDER — ACETAMINOPHEN 325 MG PO TABS
650.0000 mg | ORAL_TABLET | ORAL | Status: DC | PRN
  Administered 2022-02-20: 650 mg via ORAL
  Filled 2022-02-19 (×2): qty 2

## 2022-02-19 NOTE — ED Notes (Signed)
Pt is sleeping will get temp when pt awake

## 2022-02-19 NOTE — Hospital Course (Addendum)
Michael [name] is an 89 y.o. M with CLL, HTN, CKD IIIa baseline 1.1-1.4, DM, obesity BMI 35, and lymphedema who presented after a fall.  Had been having weight gain from 213 to 225, as well as progressive DOE and new wheeze/cough.  Tried to treat with oral Lasix but failed outpatient treatment.  Finally, fell when leaving the doctor's office, and was sent to the ER.    1/25: Admitted on diuretics

## 2022-02-19 NOTE — Progress Notes (Addendum)
    Patient: Michael [name] XTK:[id number] DOB: Apr 30, 1930      Brief hospital course: [name] is an 89 y.o. M with CLL, HTN, CKD IIIa baseline 1.1-1.4, DM, obesity BMI 35, and lymphedema who presented after a fall.  Had been having weight gain from 213 to 225, as well as progressive DOE and new wheeze/cough.  Tried to treat with oral Lasix but failed outpatient treatment.  Finally, fell when leaving the doctor's office, and was sent to the ER.    1/25: Admitted on diuretics      This is a no charge note, for further details, please see the H&P by my partner, Dr. Claria Dice from earlier today.    Principal Problem:   Acute on chronic diastolic CHF (congestive heart failure) (HCC) Active Problems:   Maxillary fracture (HCC)   Syncope and collapse   CLL (chronic lymphocytic leukemia) (HCC)   Anemia in neoplastic disease   Essential hypertension   Malnutrition of moderate degree   Type II diabetes mellitus (HCC)   Stage 3a chronic kidney disease (CKD) (HCC)   Obesity (BMI 30-39.9)    Admitted with CHF.  Continue IV Lasix. Strict I/Os.       Physical Exam: BP 107/76   Pulse 63   Temp 98.2 F (36.8 C) (Oral)   Resp 14   Ht '5\' 7"'$  (1.702 m)   Wt 102.1 kg   SpO2 99%   BMI 35.24 kg/m      Family Communication: Called to daughter, no answer.        Author: [name], MD 02/19/2022 5:14 PM

## 2022-02-19 NOTE — H&P (Addendum)
PCP:   Janie Morning, DO   Chief Complaint:  Syncope   HPI: This is a 89 year old male with past medical history of CLL(in remission), diabetes mellitus type 2, chronic lymphedema with lower extremity swelling, and GERD.  Today he went to his MDs office for fluid on lower extremity.  His weight was checked and he had increased from 213 pounds to 225 pounds in 2 months despite being on Lasix daily.  He complained of worsening shortness of breath over the past 3 weeks.  Adding, he can hardly walk a due to shortness of breath.  He denies any orthopnea.  He endorses a morning wheeze and a mild cough, both of which are new.  His PCP increased his Lasix to 3 times daily for the next 10 days.  He had a follow-up appointment.  He also received IV Rocephin for possible developing cellulitis on attempting to leave the doctor's office, he face planted in the office.  Per patient he had no prodromal symptoms, he denies any lightheadedness and dizziness, chest pains, palpitations or any symptoms whatsoever. Per patient, his PCP was concerned about his low blood pressure.  But when asked what was his blood pressure he states 170/?.  Denies he is unsure what the top number was.  He additionally states she increased his diuretics without increasing his other medications.  In the ER he has been normotensive.  So it remains unclear what his blood pressure was at the time he syncopized.  In the ER, imaging positive for mandibular fracture, mildly displaced anteriorly.  He lost 3 teeth during his fall.  Review of Systems:  The patient denies anorexia, fever, weight loss,, vision loss, decreased hearing, hoarseness, chest pain, syncope, dyspnea on exertion, peripheral edema, balance deficits, hemoptysis, abdominal pain, melena, hematochezia, severe indigestion/heartburn, hematuria, incontinence, genital sores, muscle weakness, suspicious skin lesions, transient blindness, difficulty walking, depression, unusual weight  change, abnormal bleeding, enlarged lymph nodes, angioedema, and breast masses. 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   Lymphocytosis    Type II diabetes mellitus ([place])    Past Surgical History:  Procedure Laterality Date   BOWEL RESECTION N/A 11/15/2017   Procedure: SMALL BOWEL RESECTION;  Surgeon: Coralie Keens, MD;  Location: [facility];  Service: General;  Laterality: N/A;   CATARACT EXTRACTION W/ INTRAOCULAR LENS  IMPLANT, BILATERAL Bilateral    EYE SURGERY     Cataracts (bilateral)   GROIN DISSECTION Left 11/15/2017   Procedure: GROIN EXPLORATION WITH REMOVAL OF INGUINAL HERNIA MESH;  Surgeon: Coralie Keens, MD;  Location: [facility];  Service: General;  Laterality: Left;   INGUINAL HERNIA REPAIR Left 11/10/2017   Procedure: LEFT [place];  Surgeon: Coralie Keens, MD;  Location: [facility];  Service: General;  Laterality: Left;   INGUINAL HERNIA REPAIR Left 11/15/2017   Procedure: LEFT INGUINAL HERNIA REPAIR WITH MESH;  Surgeon: Coralie Keens, MD;  Location: [facility];  Service: General;  Laterality: Left;  INSERTION OF MESH Left 11/10/2017   Procedure: INSERTION OF MESH;  Surgeon: Coralie Keens, MD;  Location: [facility];  Service: General;  Laterality: Left;    Medications: Prior to Admission medications   Medication Sig Start Date End Date Taking? Authorizing Provider  acetaminophen (TYLENOL) 650 MG CR tablet Take 1 tablet (650 mg total) by mouth 2 (two)  times daily as needed for pain. 12/22/17   Florencia Reasons, MD  aspirin EC 81 MG tablet Take 81 mg by mouth daily.    [provider]  Cholecalciferol (VITAMIN D3) 1000 units CAPS Take 1,000 Units by mouth 2 (two) times daily.     [provider]  Cyanocobalamin (VITAMIN B 12 PO) Take 1,000 mcg by mouth daily.    [provider]  insulin NPH-regular Human (70-30) 100 UNIT/ML injection Inject 35 Units into the skin 2 (two) times daily.    [provider]  mirtazapine (REMERON) 15 MG tablet Take 1 tablet (15 mg total) by mouth at bedtime. 12/22/17   Florencia Reasons, MD  Multiple Vitamin (MULTIVITAMIN WITH MINERALS) TABS tablet Take 1 tablet by mouth daily. 12/23/17   Florencia Reasons, MD  pantoprazole (PROTONIX) 40 MG tablet Take 40 mg by mouth daily. 10/22/19   [provider]  Polyethyl Glycol-Propyl Glycol (SYSTANE OP) Place 1 drop into both eyes 2 (two) times daily.    [provider]  polyethylene glycol (MIRALAX / GLYCOLAX) packet Take 17 g by mouth daily. 12/23/17   Florencia Reasons, MD  ramipril (ALTACE) 5 MG capsule Take 5 mg by mouth daily. 11/27/19   [provider]  tamsulosin (FLOMAX) 0.4 MG CAPS capsule Take 0.4 mg by mouth daily.    [provider]  torsemide (DEMADEX) 20 MG tablet Take 20 mg by mouth daily. 12/11/19   [provider]  insulin aspart protamine-insulin aspart (NOVOLOG 70/30) (70-30) 100 UNIT/ML injection Inject 35 Units into the skin 2 (two) times daily with a meal.   04/07/11  [provider]    Allergies:  No Known Allergies  Social History:  reports that he has never smoked. He quit smokeless tobacco use about 7 years ago.  His smokeless tobacco use included snuff. He reports that he does not currently use alcohol. He reports that he does not use drugs.  Family History: Family History  Problem Relation Age of Onset   Cancer Mother    Hypertension Father    Diabetes Father     Physical Exam: Vitals:    02/18/22 1605 02/18/22 2123 02/18/22 2300 02/18/22 2330  BP: 130/64 (!) 163/75 (!) 162/80 (!) 128/49  Pulse: 79 81 80 79  Resp: 18 18 (!) 8 16  Temp: (!) 97.4 F (36.3 C) 98 F (36.7 C)    TempSrc:  Oral    SpO2: 99% 100% 100% 100%  Weight:      Height:        General:  Alert and oriented times three, well developed and nourished, no acute distress Eyes: PERRLA, pink conjunctiva, no scleral icterus ENT: Moist oral mucosa, neck supple, no thyromegaly, +JVD Lungs: clear to ascultation, no wheeze, no crackles, no use of accessory muscles Cardiovascular: regular rate and rhythm, no regurgitation, no gallops, no murmurs. No carotid bruits, no JVD Abdomen: soft, positive BS, non-tender, non-distended, no organomegaly, not an acute abdomen GU: not examined Neuro: CN II - XII grossly intact, sensation intact Musculoskeletal: strength 5/5 all extremities, no clubbing, cyanosis or edema, chronic lymphedema R>L (chronic) Skin: no  rash, no subcutaneous crepitation, no decubitus Psych: appropriate patient   Labs on Admission:  Recent Labs    02/18/22 2235  NA 138  K 5.0  CL 104  CO2 23  GLUCOSE 161*  BUN 27*  CREATININE 1.27*  CALCIUM 8.3*    Recent Labs    02/18/22 2235  WBC 6.5  HGB 8.6*  HCT 29.0*  MCV 87.9  PLT 136*    Radiological Exams on Admission: CT Head Wo Contrast  Result Date: 02/18/2022 CLINICAL DATA:  Syncope, landed on face. EXAM: CT HEAD WITHOUT CONTRAST CT MAXILLOFACIAL WITHOUT CONTRAST CT CERVICAL SPINE WITHOUT CONTRAST TECHNIQUE: Multidetector CT imaging of the head, cervical spine, and maxillofacial structures were performed using the standard protocol without intravenous contrast. Multiplanar CT image reconstructions of the cervical spine and maxillofacial structures were also generated. RADIATION DOSE REDUCTION: This exam was performed according to the departmental dose-optimization program which includes automated exposure control, adjustment of the  mA and/or kV according to patient size and/or use of iterative reconstruction technique. COMPARISON:  CTA head/neck 10/20/2016 FINDINGS: CT HEAD FINDINGS Brain: There is no acute intracranial hemorrhage, extra-axial fluid collection, or acute infarct Parenchymal volume is within normal limits for age. The ventricles are normal in size. Gray-white differentiation is preserved. The pituitary and suprasellar region are normal. There is no mass lesion. There is no mass effect or midline shift. Vascular: No hyperdense vessel or unexpected calcification. Skull: Normal. Negative for fracture or focal lesion. Other: None. CT MAXILLOFACIAL FINDINGS Osseous: There is an acute fracture of the maxilla anteriorly with anterior displacement of the roots of the right lateral incisor and right canine (10-38, 10-35), fracture of the left central incisor, and suspected traumatic extraction of the right central incisor. No other acute facial bone fracture is seen. There is no evidence of mandibular dislocation. There is no suspicious osseous lesion. Orbits: The globes are intact with bilateral lens implants in place. There is no retrobulbar hematoma. Sinuses: There is mild mucosal thickening in the paranasal sinuses. Soft tissues: Unremarkable Other: There is extensive dental disease primarily involving the maxillary teeth. CT CERVICAL SPINE FINDINGS Alignment: There is no significant antero or retrolisthesis. There is no jumped or perched facet or other evidence of traumatic malalignment. Skull base and vertebrae: Skull base alignment is maintained. Vertebral body heights are preserved. There is diffuse sclerosis in the imaged vertebral bodies, similar to 2018 and nonspecific. There is no focal suspicious osseous lesion. Soft tissues and spinal canal: No prevertebral fluid or swelling. No visible canal hematoma. Disc levels: There is multilevel disc space narrowing and degenerative endplate change, most advanced at C3-C4, C5-C6, and  C6-C7. There is no evidence of high-grade spinal canal stenosis. Upper chest: The imaged lung apices are clear. Other: None. IMPRESSION: 1. No acute intracranial pathology. 2. Acute fracture of the maxilla anteriorly with anterior displacement of the roots of the right lateral incisor and right canine, fracture of the left central incisor, and suspected traumatic extraction of the right central incisor. 3. No acute fracture or traumatic malalignment of the cervical spine. Electronically Signed   By: [name] M.D.   On: 02/18/2022 16:54   CT Maxillofacial Wo Contrast  Result Date: 02/18/2022 CLINICAL DATA:  Syncope, landed on face. EXAM: CT HEAD WITHOUT CONTRAST CT MAXILLOFACIAL WITHOUT CONTRAST CT CERVICAL SPINE WITHOUT CONTRAST TECHNIQUE: Multidetector CT imaging of the head, cervical spine, and maxillofacial structures were performed using the standard protocol without intravenous contrast. Multiplanar CT image reconstructions of the cervical spine and  maxillofacial structures were also generated. RADIATION DOSE REDUCTION: This exam was performed according to the departmental dose-optimization program which includes automated exposure control, adjustment of the mA and/or kV according to patient size and/or use of iterative reconstruction technique. COMPARISON:  CTA head/neck 10/20/2016 FINDINGS: CT HEAD FINDINGS Brain: There is no acute intracranial hemorrhage, extra-axial fluid collection, or acute infarct Parenchymal volume is within normal limits for age. The ventricles are normal in size. Gray-white differentiation is preserved. The pituitary and suprasellar region are normal. There is no mass lesion. There is no mass effect or midline shift. Vascular: No hyperdense vessel or unexpected calcification. Skull: Normal. Negative for fracture or focal lesion. Other: None. CT MAXILLOFACIAL FINDINGS Osseous: There is an acute fracture of the maxilla anteriorly with anterior displacement of the roots of the  right lateral incisor and right canine (10-38, 10-35), fracture of the left central incisor, and suspected traumatic extraction of the right central incisor. No other acute facial bone fracture is seen. There is no evidence of mandibular dislocation. There is no suspicious osseous lesion. Orbits: The globes are intact with bilateral lens implants in place. There is no retrobulbar hematoma. Sinuses: There is mild mucosal thickening in the paranasal sinuses. Soft tissues: Unremarkable Other: There is extensive dental disease primarily involving the maxillary teeth. CT CERVICAL SPINE FINDINGS Alignment: There is no significant antero or retrolisthesis. There is no jumped or perched facet or other evidence of traumatic malalignment. Skull base and vertebrae: Skull base alignment is maintained. Vertebral body heights are preserved. There is diffuse sclerosis in the imaged vertebral bodies, similar to 2018 and nonspecific. There is no focal suspicious osseous lesion. Soft tissues and spinal canal: No prevertebral fluid or swelling. No visible canal hematoma. Disc levels: There is multilevel disc space narrowing and degenerative endplate change, most advanced at C3-C4, C5-C6, and C6-C7. There is no evidence of high-grade spinal canal stenosis. Upper chest: The imaged lung apices are clear. Other: None. IMPRESSION: 1. No acute intracranial pathology. 2. Acute fracture of the maxilla anteriorly with anterior displacement of the roots of the right lateral incisor and right canine, fracture of the left central incisor, and suspected traumatic extraction of the right central incisor. 3. No acute fracture or traumatic malalignment of the cervical spine. Electronically Signed   By: [name] M.D.   On: 02/18/2022 16:54   CT Cervical Spine Wo Contrast  Result Date: 02/18/2022 CLINICAL DATA:  Syncope, landed on face. EXAM: CT HEAD WITHOUT CONTRAST CT MAXILLOFACIAL WITHOUT CONTRAST CT CERVICAL SPINE WITHOUT CONTRAST  TECHNIQUE: Multidetector CT imaging of the head, cervical spine, and maxillofacial structures were performed using the standard protocol without intravenous contrast. Multiplanar CT image reconstructions of the cervical spine and maxillofacial structures were also generated. RADIATION DOSE REDUCTION: This exam was performed according to the departmental dose-optimization program which includes automated exposure control, adjustment of the mA and/or kV according to patient size and/or use of iterative reconstruction technique. COMPARISON:  CTA head/neck 10/20/2016 FINDINGS: CT HEAD FINDINGS Brain: There is no acute intracranial hemorrhage, extra-axial fluid collection, or acute infarct Parenchymal volume is within normal limits for age. The ventricles are normal in size. Gray-white differentiation is preserved. The pituitary and suprasellar region are normal. There is no mass lesion. There is no mass effect or midline shift. Vascular: No hyperdense vessel or unexpected calcification. Skull: Normal. Negative for fracture or focal lesion. Other: None. CT MAXILLOFACIAL FINDINGS Osseous: There is an acute fracture of the maxilla anteriorly with anterior displacement of the roots of  the right lateral incisor and right canine (10-38, 10-35), fracture of the left central incisor, and suspected traumatic extraction of the right central incisor. No other acute facial bone fracture is seen. There is no evidence of mandibular dislocation. There is no suspicious osseous lesion. Orbits: The globes are intact with bilateral lens implants in place. There is no retrobulbar hematoma. Sinuses: There is mild mucosal thickening in the paranasal sinuses. Soft tissues: Unremarkable Other: There is extensive dental disease primarily involving the maxillary teeth. CT CERVICAL SPINE FINDINGS Alignment: There is no significant antero or retrolisthesis. There is no jumped or perched facet or other evidence of traumatic malalignment. Skull base  and vertebrae: Skull base alignment is maintained. Vertebral body heights are preserved. There is diffuse sclerosis in the imaged vertebral bodies, similar to 2018 and nonspecific. There is no focal suspicious osseous lesion. Soft tissues and spinal canal: No prevertebral fluid or swelling. No visible canal hematoma. Disc levels: There is multilevel disc space narrowing and degenerative endplate change, most advanced at C3-C4, C5-C6, and C6-C7. There is no evidence of high-grade spinal canal stenosis. Upper chest: The imaged lung apices are clear. Other: None. IMPRESSION: 1. No acute intracranial pathology. 2. Acute fracture of the maxilla anteriorly with anterior displacement of the roots of the right lateral incisor and right canine, fracture of the left central incisor, and suspected traumatic extraction of the right central incisor. 3. No acute fracture or traumatic malalignment of the cervical spine. Electronically Signed   By: [name] M.D.   On: 02/18/2022 16:54   DG Pelvis 1-2 Views  Result Date: 02/18/2022 CLINICAL DATA:  Chest pain syncopal episode EXAM: PELVIS - 1-2 VIEW COMPARISON:  12/21/2017 FINDINGS: SI joint degenerative changes. Pubic symphysis and rami appear intact. No fracture or malalignment IMPRESSION: No acute osseous abnormality Electronically Signed   By: Donavan Foil M.D.   On: 02/18/2022 16:22   DG Chest 1 View  Result Date: 02/18/2022 CLINICAL DATA:  Chest pain EXAM: CHEST  1 VIEW COMPARISON:  11/22/2017 FINDINGS: Suspect trace right pleural effusion. No consolidation or effusion. Normal cardiac size. No pneumothorax. High-riding right humeral head right greater than left humeral heads consistent with rotator cuff disease. Degenerative changes at the right greater than left shoulders. IMPRESSION: Suspect trace right pleural effusion. Electronically Signed   By: Donavan Foil M.D.   On: 02/18/2022 16:21    Assessment/Plan Present on Admission:  Fluid overload -Admit to  med telemetry -CHF order set initiated -IV Lasix's, strict I's and O's, daily weights -Echo in a.m. -Consult to cardiology placed   Mandibular fracture [facility]) -ENT consult in a.m. -Outpatient dental visit   Syncope and collapse -CT head normal -Monitoring on telemetry.  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 -BP meds resumed with hold parameters   Malnutrition of moderate degree -  Fani Rotondo 02/19/2022, 12:31 AM

## 2022-02-19 NOTE — ED Notes (Signed)
Pt c/o facial pain from injury, and heartburn. Provider notified.

## 2022-02-20 ENCOUNTER — Inpatient Hospital Stay (HOSPITAL_COMMUNITY): Payer: Medicare HMO

## 2022-02-20 ENCOUNTER — Other Ambulatory Visit: Payer: Self-pay | Admitting: Hematology and Oncology

## 2022-02-20 DIAGNOSIS — R55 Syncope and collapse: Secondary | ICD-10-CM | POA: Diagnosis not present

## 2022-02-20 DIAGNOSIS — S02401A Maxillary fracture, unspecified, initial encounter for closed fracture: Secondary | ICD-10-CM | POA: Diagnosis not present

## 2022-02-20 DIAGNOSIS — D630 Anemia in neoplastic disease: Secondary | ICD-10-CM | POA: Diagnosis not present

## 2022-02-20 DIAGNOSIS — I5033 Acute on chronic diastolic (congestive) heart failure: Secondary | ICD-10-CM | POA: Diagnosis not present

## 2022-02-20 DIAGNOSIS — D509 Iron deficiency anemia, unspecified: Secondary | ICD-10-CM | POA: Insufficient documentation

## 2022-02-20 DIAGNOSIS — C9110 Chronic lymphocytic leukemia of B-cell type not having achieved remission: Secondary | ICD-10-CM

## 2022-02-20 DIAGNOSIS — D500 Iron deficiency anemia secondary to blood loss (chronic): Secondary | ICD-10-CM

## 2022-02-20 DIAGNOSIS — I5031 Acute diastolic (congestive) heart failure: Secondary | ICD-10-CM | POA: Diagnosis not present

## 2022-02-20 LAB — IRON AND TIBC
Iron: 40 ug/dL — ABNORMAL LOW (ref 45–182)
Saturation Ratios: 10 % — ABNORMAL LOW (ref 17.9–39.5)
TIBC: 419 ug/dL (ref 250–450)
UIBC: 379 ug/dL

## 2022-02-20 LAB — GLUCOSE, CAPILLARY
Glucose-Capillary: 202 mg/dL — ABNORMAL HIGH (ref 70–99)
Glucose-Capillary: 165 mg/dL — ABNORMAL HIGH (ref 70–99)
Glucose-Capillary: 204 mg/dL — ABNORMAL HIGH (ref 70–99)

## 2022-02-20 LAB — BASIC METABOLIC PANEL
Anion gap: 7 (ref 5–15)
BUN: 22 mg/dL (ref 8–23)
CO2: 26 mmol/L (ref 22–32)
Calcium: 8.9 mg/dL (ref 8.9–10.3)
Chloride: 106 mmol/L (ref 98–111)
Creatinine, Ser: 1.1 mg/dL (ref 0.61–1.24)
GFR, Estimated: >60 mL/min (ref >60)
Glucose, Bld: 162 mg/dL — ABNORMAL HIGH (ref 70–99)
Potassium: 4.3 mmol/L (ref 3.5–5.1)
Sodium: 139 mmol/L (ref 135–145)

## 2022-02-20 LAB — CBC
HCT: 26.4 % — ABNORMAL LOW (ref 39.0–52.0)
Hemoglobin: 8.1 g/dL — ABNORMAL LOW (ref 13.0–17.0)
MCH: 26.3 pg (ref 26.0–34.0)
MCHC: 30.7 g/dL (ref 30.0–36.0)
MCV: 85.7 fL (ref 80.0–100.0)
Platelets: 123 10*3/uL — ABNORMAL LOW (ref 150–400)
RBC: 3.08 MIL/uL — ABNORMAL LOW (ref 4.22–5.81)
RDW: 15.1 % (ref 11.5–15.5)
WBC: 5.8 10*3/uL (ref 4.0–10.5)
nRBC: 0 % (ref 0.0–0.2)

## 2022-02-20 LAB — FERRITIN: Ferritin: 13 ng/mL — ABNORMAL LOW (ref 24–336)

## 2022-02-20 LAB — CBG MONITORING, ED: Glucose-Capillary: 174 mg/dL — ABNORMAL HIGH (ref 70–99)

## 2022-02-20 LAB — ECHOCARDIOGRAM COMPLETE
AR max vel: 2.25 cm2
AV Area VTI: 2.34 cm2
AV Area mean vel: 1.99 cm2
AV Mean grad: 4 mmHg
AV Peak grad: 8 mmHg
Ao pk vel: 1.41 m/s
Area-P 1/2: 3.91 cm2
Height: 67 in
S' Lateral: 2.5 cm
Weight: 3600 oz

## 2022-02-20 LAB — VITAMIN B12: Vitamin B-12: 525 pg/mL (ref 180–914)

## 2022-02-20 MED ORDER — OXYCODONE HCL 5 MG PO TABS
5.0000 mg | ORAL_TABLET | Freq: Once | ORAL | Status: AC
  Administered 2022-02-20: 5 mg via ORAL
  Filled 2022-02-20: qty 1

## 2022-02-20 MED ORDER — SODIUM CHLORIDE 0.9 % IV SOLN
400.0000 mg | Freq: Once | INTRAVENOUS | Status: AC
  Administered 2022-02-20: 400 mg via INTRAVENOUS
  Filled 2022-02-20: qty 20

## 2022-02-20 MED ORDER — FAMOTIDINE 20 MG PO TABS: 10.0000 mg | ORAL_TABLET | Freq: Two times a day (BID) | ORAL | Status: DC | PRN

## 2022-02-20 MED ORDER — GLUCERNA SHAKE PO LIQD
237.0000 mL | Freq: Two times a day (BID) | ORAL | Status: DC
  Administered 2022-02-20 – 2022-02-22 (×4): 237 mL via ORAL
  Filled 2022-02-20 (×5): qty 237

## 2022-02-20 MED ORDER — DOUBLE ANTIBIOTIC 500-10000 UNIT/GM EX OINT
TOPICAL_OINTMENT | CUTANEOUS | Status: AC
  Administered 2022-02-20: 1 via TOPICAL
  Filled 2022-02-20: qty 28.35

## 2022-02-20 NOTE — Assessment & Plan Note (Signed)
Glucose controlled - Continue sliding scale corrections - Continue 70/30

## 2022-02-20 NOTE — Evaluation (Signed)
Physical Therapy Evaluation Patient Details Name: Michael [name] MRN: [id number] DOB: 1930/06/20 Today's Date: 02/20/2022  History of Present Illness  Pt is 89 yo male admitted 02/18/22 with acute on chronic CHF, syncope with fall.  Pt with hx including but not limited to CLL, HTN, CKD, DM, and lymphedema.  Clinical Impression  Pt admitted with above diagnosis. At baseline, pt is ambulatory with cane and lives alone but daughter next door and can assist as needed at d/c.  Today pt was mod I to supervision for safety level for transfers and to ambulate 200' with RW.  VSS during session and pt denies any lightheadedness.  He reports mobility feels near baseline level.   Pt demonstrates safe gait & transfers in order to return home from PT perspective once discharged by MD and does not require further skilled PT services.        Recommendations for follow up therapy are one component of a multi-disciplinary discharge planning process, led by the attending physician.  Recommendations may be updated based on patient status, additional functional criteria and insurance authorization.  Follow Up Recommendations No PT follow up      Assistance Recommended at Discharge PRN  Patient can return home with the following  Assistance with cooking/housework;Help with stairs or ramp for entrance    Equipment Recommendations None recommended by PT  Recommendations for Other Services       Functional Status Assessment Patient has had a recent decline in their functional status and demonstrates the ability to make significant improvements in function in a reasonable and predictable amount of time.     Precautions / Restrictions Precautions Precautions: None      Mobility  Bed Mobility Overal bed mobility: Modified Independent                  Transfers Overall transfer level: Needs assistance Equipment used: None Transfers: Sit to/from Stand Sit to Stand: Supervision                 Ambulation/Gait Ambulation/Gait assistance: Supervision Gait Distance (Feet): 200 Feet Assistive device: Rolling walker (2 wheels) Gait Pattern/deviations: Step-through pattern       General Gait Details: normal gait with good speed and steady balance; denies any lightheadedness  Stairs            Wheelchair Mobility    Modified Rankin (Stroke Patients Only)       Balance Overall balance assessment: Needs assistance Sitting-balance support: No upper extremity supported Sitting balance-Leahy Scale: Good     Standing balance support: No upper extremity supported Standing balance-Leahy Scale: Good                               Pertinent Vitals/Pain Pain Assessment Pain Assessment: No/denies pain    Home Living Family/patient expects to be discharged to:: Private residence Living Arrangements: Alone Available Help at Discharge: Family;Available PRN/intermittently (dtr lives across the road but could stay if needed for a few days) Type of Home: House Home Access: Ramped entrance       Home Layout: One level Home Equipment: Grab bars - tub/shower;Shower seat;Cane - single [employer]ista (2 wheels)      Prior Function Prior Level of Function : Independent/Modified Independent;Driving             Mobility Comments: Can ambulate in community; uses cane; denies falls in last year ADLs Comments: indendent adls and iadls  Hand Dominance   Dominant Hand: Right    Extremity/Trunk Assessment   Upper Extremity Assessment Upper Extremity Assessment: Overall WFL for tasks assessed    Lower Extremity Assessment Lower Extremity Assessment: LLE deficits/detail;RLE deficits/detail RLE Deficits / Details: ROM WFL; MMT 5/5 ; +edema LLE Deficits / Details: ROM WFL; MMT 5/5 ; +edema    Cervical / Trunk Assessment Cervical / Trunk Assessment: Normal  Communication   Communication: No difficulties  Cognition Arousal/Alertness:  Awake/alert Behavior During Therapy: WFL for tasks assessed/performed Overall Cognitive Status: Within Functional Limits for tasks assessed                                          General Comments General comments (skin integrity, edema, etc.): On RA with O2 sats 98%.  Pt admitted with syncope - nursing checked orthostatic BP earlier and was + but pt asymptomatic and improved with 3 mins standing.  Pt asymptomatic throughout session    Exercises     Assessment/Plan    PT Assessment Patient does not need any further PT services  PT Problem List         PT Treatment Interventions      PT Goals (Current goals can be found in the Care Plan section)  Acute Rehab PT Goals Patient Stated Goal: return home PT Goal Formulation: All assessment and education complete, DC therapy    Frequency       Co-evaluation               AM-PAC PT "6 Clicks" Mobility  Outcome Measure Help needed turning from your back to your side while in a flat bed without using bedrails?: None Help needed moving from lying on your back to sitting on the side of a flat bed without using bedrails?: None Help needed moving to and from a bed to a chair (including a wheelchair)?: A Little Help needed standing up from a chair using your arms (e.g., wheelchair or bedside chair)?: A Little Help needed to walk in hospital room?: A Little Help needed climbing 3-5 steps with a railing? : A Little 6 Click Score: 20    End of Session   Activity Tolerance: Patient tolerated treatment well Patient left: in chair;with call bell/phone within reach;with family/visitor present Nurse Communication: Mobility status PT Visit Diagnosis: Other abnormalities of gait and mobility (R26.89)    Time: 0037-0488 PT Time Calculation (min) (ACUTE ONLY): 21 min   Charges:   PT Evaluation $PT Eval Low Complexity: 1 Low          Tyah Acord, PT Acute Rehab [facility] [phone]   Karlton Lemon 02/20/2022, 4:20 PM

## 2022-02-20 NOTE — Assessment & Plan Note (Signed)
No arrhythmias noted on telemetry - PT eval

## 2022-02-20 NOTE — Assessment & Plan Note (Signed)
As evidenced by mild loss of subcutaneous muscle mass and fat diffusely, chronic kidney disease, heart failure

## 2022-02-20 NOTE — Assessment & Plan Note (Signed)
Blood count stable relative to baseline, hemoglobin 8.1

## 2022-02-20 NOTE — Assessment & Plan Note (Signed)
Hgb baseline is 9-10, here down to 7.6 today - Transfused iron yesterday - Transfuse 1 unit today

## 2022-02-20 NOTE — Assessment & Plan Note (Signed)
BMI 35 

## 2022-02-20 NOTE — Progress Notes (Signed)
  Progress Note   Patient: Michael [name] VHQ:[id number] DOB: [birth year] DOA: 02/18/2022     1 DOS: the patient was seen and examined on 02/20/2022 at 10:25 AM      Brief hospital course: [name] is an 89 y.o. M with CLL, HTN, CKD IIIa baseline 1.1-1.4, DM, obesity BMI 35, and lymphedema who presented after a fall.  Had been having weight gain from 213 to 225, as well as progressive DOE and new wheeze/cough.  Tried to treat with oral Lasix but failed outpatient treatment.  Finally, fell when leaving the doctor's office, and was sent to the ER.    1/25: Admitted on diuretics     Assessment and Plan: * Acute on chronic diastolic CHF (congestive heart failure) ([place]) Presented with 12 pounds weight gain, dyspnea on exertion, nocturnal cough, pleural effusion, elevated BNP.  Failed outpatient management with increased diuretics.  Echo today shows preserved EF.  No significant valvular disease. Creatinine and potassium stable, and snouts not recorded in the ER, swelling slightly improved but still present - Continue Lasix 40 mg twice daily - Continue monitoring potassium - Daily BMP, strict ins and outs, daily weights    Maxillary fracture [facility]) Outpatient follow-up with oral surgeon  Syncope and collapse No arrhythmias noted on telemetry - PT eval  Obesity (BMI 30-39.9) BMI 35  Stage 3a chronic kidney disease (CKD) (HCC) Relative to baseline, 1.1  Type II diabetes mellitus (HCC) Glucose controlled - Continue sliding scale corrections - Continue 70/30  Malnutrition of moderate degree As evidenced by mild loss of subcutaneous muscle mass and fat diffusely, chronic kidney disease, heart failure  Essential hypertension Blood pressure controlled - Continue ramipril, Lasix  Anemia in neoplastic disease Hemoglobin unchanged  CLL (chronic lymphocytic leukemia) (HCC) Blood count stable relative to baseline, hemoglobin 8.1          Subjective: Patient feels  his swelling is slightly better, his pains are improved from yesterday, no chest pain, confusion, dyspnea, vomiting.     Physical Exam: BP (!) 127/45 (BP Location: Left Arm)   Pulse (!) 59   Temp 99.3 F (37.4 C) (Oral)   Resp 18   Ht '5\' 7"'$  (1.702 m)   Wt 102.1 kg   SpO2 100%   BMI 35.24 kg/m   Elderly adult male, lying in bed, no acute distress RRR, no murmurs, still pitting edema and brawny changes both lower extremities Respiratory rate normal, lungs clear without rales or wheezes Abdomen soft without tenderness palpation or guarding, no ascites or distention Attention normal, affect appropriate, judgment insight appear normal  Data Reviewed: Creatinine 1.1, no change, potassium normal CBC shows hemoglobin 8.1  Family Communication: called to daughter connie again no answer    Disposition: Status is: Inpatient To snf, likely tomorrow        Author: [name], MD 02/20/2022 1:43 PM  For on call review [email or web address].

## 2022-02-20 NOTE — ED Notes (Signed)
.  EDH

## 2022-02-20 NOTE — Consult Note (Addendum)
[place] Nurse Consult Note: Reason for Consult:Consult requested for left outer heel chronic wound. Loose outer slough removes, revealing 90% red, 10% yellow Stage 3 pressure injury; 1X1X.2cm, red and moist with loose skin to outer edge that is adhered.  Pressure Injury POA: Yes Topical treatment orders provided for bedside nurses to perform to promote moist healing as follows: Apply Antibiotic ointment to left outer heel wound Q day, then cover with foam dressing.  (Change foam dressing Q 3 days or PRN soiling.) Please re-consult if further assistance is needed.  Thank-you,  Julien Girt MSN, [place], [place], [place], [place]

## 2022-02-20 NOTE — Assessment & Plan Note (Signed)
Presented with 12 pounds weight gain, dyspnea on exertion, nocturnal cough, pleural effusion, elevated BNP.  Failed outpatient management with increased diuretics.  Echo today shows preserved EF.  No significant valvular disease. Creatinine and potassium stable, and snouts not recorded in the ER, swelling slightly improved but still present - Continue Lasix 40 mg twice daily - Continue monitoring potassium - Daily BMP, strict ins and outs, daily weights

## 2022-02-20 NOTE — Assessment & Plan Note (Signed)
Relative to baseline, 1.1

## 2022-02-20 NOTE — Care Plan (Signed)
Called to daughter, no answer.

## 2022-02-20 NOTE — Progress Notes (Signed)
Michael [name]   DOB:1930-04-12   HQ#:[id number]    ASSESSMENT & PLAN:  Iron deficiency anemia The patient is quite symptomatic, to the point of profound weakness leading to his fall and facial injury We discussed the risk and benefits of further GI evaluation In the meantime, I recommend intravenous iron infusion The patient has severe constipation in the background and is not likely able to tolerate oral iron supplement and will take forever for oral iron supplement to improve his blood count The most likely cause of his anemia is due to chronic blood loss/malabsorption syndrome. We discussed some of the risks, benefits, and alternatives of intravenous iron infusions. The patient is symptomatic from anemia and the iron level is critically low. He tolerated oral iron supplement poorly and desires to achieved higher levels of iron faster for adequate hematopoesis. Some of the side-effects to be expected including risks of infusion reactions, phlebitis, headaches, nausea and fatigue.  The patient is willing to proceed. Patient education material was dispensed.  Goal is to keep ferritin level greater than 50 and resolution of anemia He agrees to intravenous iron sucrose x 1 I will schedule outpatient follow-up in a few weeks for further treatment  Congestive heart failure Could be precipitated by anemia Continue medical management  CLL He was in remission last year and we discontinue his chemotherapy Observe only  Discharge planning Would defer to primary service I will arrange outpatient follow-up next month I will sign off  All questions were answered. The patient knows to call the clinic with any problems, questions or concerns.   The total time spent in the appointment was 60 minutes encounter with patients including review of chart and various tests results, discussions about plan of care and coordination of care plan  Heath Lark, MD 02/20/2022 3:26 PM  Subjective:  The patient is  well-known to me.  I was notified by his primary care physician to evaluate the patient due to severe anemia.  It was an urgent consult call to my office yesterday The patient was weak recently leading to a fall and he fell and injured his face.  The patient denies any recent signs or symptoms of bleeding such as spontaneous epistaxis, hematuria or hematochezia. He could not recall his last GI evaluation He was last seen and of the year last year with normal imaging study and ibrutinib was discontinued for CLL  Oncology History  CLL (chronic lymphocytic leukemia) ([place])  04/03/2008 Pathology Results   Case #: [id number] FLow cytomtry confirmed CLL   10/11/2014 Tumor Marker   FISH analysis showed trisomy 12 abnormality   04/29/2015 - 12/01/2021 Chemotherapy   He started taking Ibrutinib   04/29/2015 Imaging   Ct scan showed mild lymphadenopathy and splenomegaly   05/13/2015 Adverse Reaction   Due to neutropenia, dose of Ibrutinib is reduced to 280 mg daily   01/08/2016 Imaging   Response to therapy. Resolution of thoracic and abdominal adenopathy. Resolution of splenomegaly. 2. No new or progressive disease. 3. Small bowel containing left inguinal hernia and right-sided hydrocele, as before. 4. Esophageal air fluid level suggests dysmotility or gastroesophageal reflux. 5.  Possible constipation. 6. Suspect hepatic hemangiomas, similar.   10/09/2016 Imaging   No findings suspicious for active lymphomatous involvement.  No abdominopelvic lymphadenopathy.  Spleen is normal in size.  Moderate to large left inguinal/scrotal hernia containing small bowel and fat. No evidence of bowel obstruction.  Additional stable ancillary findings as above.   04/26/2017 Imaging   1. No lymphadenopathy  in the chest, abdomen, or pelvis. 2. Mild circumferential bladder wall thickening. Incomplete distention may contribute to this appearance, but bladder infection is also consideration. 3. Stable appearance small  hepatic lesions. Imaging features most suggestive of benign cavernous hemangiomas. 4. Stable tiny low-density renal lesions compatible with cysts. 5. Large left groin hernia contains numerous small bowel loops without complicating features. 6. Right-sided hydrocele.   11/15/2017 Imaging   Large recurrent left inguinal hernia with extension of multiple small bowel loops into the inguinal canal and scrotum on the left. Proximal small bowel dilatation is noted consistent with a degree of incarceration.   Bilateral small effusions as well as patchy bibasilar infiltrates.     12/14/2017 Adverse Reaction   Ibrutinib placed on hold   06/17/2020 Cancer Staging   Staging form: Chronic Lymphocytic Leukemia / Small Lymphocytic Lymphoma, AJCC 8th Edition - Clinical stage from 06/17/2020: Modified Rai Stage 0 (Modified Rai risk: Low, Lymphocytosis: Present, Adenopathy: Absent, Organomegaly: Absent, Anemia: Absent, Thrombocytopenia: Absent) - Signed by Heath Lark, MD on 06/17/2020 Stage prefix: Initial diagnosis   11/27/2021 Imaging   Chest Impression:   1. No lymphadenopathy. 2. No pulmonary parenchymal findings   Abdomen / Pelvis Impression:   1. No evidence of lymphoma/lymphadenopathy in the abdomen pelvis. 2. Normal spleen. 3. Short segment of small bowel enters a RIGHT inguinal hernia. 4. RIGHT hydrocele .     Objective:  Vitals:   02/20/22 1235 02/20/22 1438  BP: (!) 127/45 138/60  Pulse: (!) 59 73  Resp: 18 17  Temp: 99.3 F (37.4 C)   SpO2: 100%     No intake or output data in the 24 hours ending 02/20/22 1526  GENERAL:alert, no distress and comfortable  NEURO: alert & oriented x 3 with fluent speech, no focal motor/sensory deficits   Labs:  Recent Labs    04/15/21 1003 08/15/21 1040 11/18/21 1009 02/18/22 2235 02/19/22 0620 02/20/22 0732  NA 138 138 136 138 140 139  K 4.5 4.4 5.0 5.0 4.8 4.3  CL 111 107 105 104 107 106  CO2 20* '26 25 23 25 26  '$ GLUCOSE 161* 157*  194* 161* 137* 162*  BUN 41* 35* 24* 27* 27* 22  CREATININE 1.41* 1.47* 1.44* 1.27* 1.12 1.10  CALCIUM 9.1 8.7* 8.3* 8.3* 8.6* 8.9  GFRNONAA 48* 45* 46* 54* >60 >60  PROT 6.8 5.9* 6.3*  --   --   --   ALBUMIN 3.9 3.6 3.5  --   --   --   AST 13* 12* 14*  --   --   --   ALT '7 6 10  '$ --   --   --   ALKPHOS 50 43 45  --   --   --   BILITOT 0.4 0.3 0.4  --   --   --     Studies:  ECHOCARDIOGRAM COMPLETE  Result Date: 02/20/2022    ECHOCARDIOGRAM REPORT   Patient Name:   Michael [name] Date of Exam: 02/20/2022 Medical Rec #:  [id number]       Height:       67.0 in Accession #:    [id number]      Weight:       225.0 lb Date of Birth:  1930/05/08       BSA:          2.126 m Patient Age:    15 years        BP:  123/59 mmHg Patient Gender: M               HR:           72 bpm. Exam Location:  Inpatient Procedure: 2D Echo, Cardiac Doppler and Color Doppler Indications:    CHF-Acute Diastolic W09.81  History:        Patient has prior history of Echocardiogram examinations, most                 recent 04/19/2017. CHF, Signs/Symptoms:Hypotension and Syncope;                 Risk Factors:Hypertension. CKD, stage III.  Sonographer:    Ronny Flurry Referring Phys: [phone] DEBBY CROSLEY IMPRESSIONS  1. Left ventricular ejection fraction, by estimation, is 60 to 65%. The left ventricle has normal function. The left ventricle has no regional wall motion abnormalities. Left ventricular diastolic parameters were normal.  2. Right ventricular systolic function is normal. The right ventricular size is normal.  3. Left atrial size was mildly dilated.  4. The mitral valve is abnormal. Mild mitral valve regurgitation. No evidence of mitral stenosis.  5. The aortic valve is tricuspid. There is moderate calcification of the aortic valve. There is moderate thickening of the aortic valve. Aortic valve regurgitation is not visualized. Aortic valve sclerosis is present, with no evidence of aortic valve stenosis.  6. The  inferior vena cava is normal in size with greater than 50% respiratory variability, suggesting right atrial pressure of 3 mmHg. FINDINGS  Left Ventricle: Left ventricular ejection fraction, by estimation, is 60 to 65%. The left ventricle has normal function. The left ventricle has no regional wall motion abnormalities. The left ventricular internal cavity size was normal in size. There is  no left ventricular hypertrophy. Left ventricular diastolic parameters were normal. Right Ventricle: The right ventricular size is normal. No increase in right ventricular wall thickness. Right ventricular systolic function is normal. Left Atrium: Left atrial size was mildly dilated. Right Atrium: Right atrial size was normal in size. Pericardium: There is no evidence of pericardial effusion. Mitral Valve: The mitral valve is abnormal. There is mild thickening of the mitral valve leaflet(s). There is mild calcification of the mitral valve leaflet(s). Mild mitral annular calcification. Mild mitral valve regurgitation. No evidence of mitral valve stenosis. Tricuspid Valve: The tricuspid valve is normal in structure. Tricuspid valve regurgitation is mild . No evidence of tricuspid stenosis. Aortic Valve: The aortic valve is tricuspid. There is moderate calcification of the aortic valve. There is moderate thickening of the aortic valve. Aortic valve regurgitation is not visualized. Aortic valve sclerosis is present, with no evidence of aortic valve stenosis. Aortic valve mean gradient measures 4.0 mmHg. Aortic valve peak gradient measures 8.0 mmHg. Aortic valve area, by VTI measures 2.34 cm. Pulmonic Valve: The pulmonic valve was normal in structure. Pulmonic valve regurgitation is not visualized. No evidence of pulmonic stenosis. Aorta: The aortic root is normal in size and structure. Venous: The inferior vena cava is normal in size with greater than 50% respiratory variability, suggesting right atrial pressure of 3 mmHg.  IAS/Shunts: No atrial level shunt detected by color flow Doppler.  LEFT VENTRICLE PLAX 2D LVIDd:         4.80 cm   Diastology LVIDs:         2.50 cm   LV e' medial:    5.55 cm/s LV PW:         0.80 cm   LV E/e' medial:  20.5 LV IVS:        0.60 cm   LV e' lateral:   8.92 cm/s LVOT diam:     2.00 cm   LV E/e' lateral: 12.8 LV SV:         70 LV SV Index:   33 LVOT Area:     3.14 cm  RIGHT VENTRICLE            IVC RV S prime:     9.57 cm/s  IVC diam: 2.20 cm TAPSE (M-mode): 2.4 cm LEFT ATRIUM             Index        RIGHT ATRIUM           Index LA diam:        4.10 cm 1.93 cm/m   RA Area:     12.40 cm LA Vol (A2C):   56.2 ml 26.43 ml/m  RA Volume:   23.20 ml  10.91 ml/m LA Vol (A4C):   60.7 ml 28.55 ml/m LA Biplane Vol: 58.3 ml 27.42 ml/m  AORTIC VALVE AV Area (Vmax):    2.25 cm AV Area (Vmean):   1.99 cm AV Area (VTI):     2.34 cm AV Vmax:           141.00 cm/s AV Vmean:          94.400 cm/s AV VTI:            0.298 m AV Peak Grad:      8.0 mmHg AV Mean Grad:      4.0 mmHg LVOT Vmax:         100.80 cm/s LVOT Vmean:        59.850 cm/s LVOT VTI:          0.222 m LVOT/AV VTI ratio: 0.74  AORTA Ao Root diam: 3.60 cm Ao Asc diam:  3.20 cm MITRAL VALVE                TRICUSPID VALVE MV Area (PHT): 3.91 cm     TR Peak grad:   24.8 mmHg MV Decel Time: 194 msec     TR Vmax:        249.00 cm/s MV E velocity: 114.00 cm/s MV A velocity: 68.10 cm/s   SHUNTS MV E/A ratio:  1.67         Systemic VTI:  0.22 m                             Systemic Diam: 2.00 cm Jenkins Rouge MD Electronically signed by Jenkins Rouge MD Signature Date/Time: 02/20/2022/9:57:13 AM    Final    CT Head Wo Contrast  Result Date: 02/18/2022 CLINICAL DATA:  Syncope, landed on face. EXAM: CT HEAD WITHOUT CONTRAST CT MAXILLOFACIAL WITHOUT CONTRAST CT CERVICAL SPINE WITHOUT CONTRAST TECHNIQUE: Multidetector CT imaging of the head, cervical spine, and maxillofacial structures were performed using the standard protocol without intravenous contrast.  Multiplanar CT image reconstructions of the cervical spine and maxillofacial structures were also generated. RADIATION DOSE REDUCTION: This exam was performed according to the departmental dose-optimization program which includes automated exposure control, adjustment of the mA and/or kV according to patient size and/or use of iterative reconstruction technique. COMPARISON:  CTA head/neck 10/20/2016 FINDINGS: CT HEAD FINDINGS Brain: There is no acute intracranial hemorrhage, extra-axial fluid collection, or acute infarct Parenchymal volume is within normal limits for age. The ventricles are normal in  size. Gray-white differentiation is preserved. The pituitary and suprasellar region are normal. There is no mass lesion. There is no mass effect or midline shift. Vascular: No hyperdense vessel or unexpected calcification. Skull: Normal. Negative for fracture or focal lesion. Other: None. CT MAXILLOFACIAL FINDINGS Osseous: There is an acute fracture of the maxilla anteriorly with anterior displacement of the roots of the right lateral incisor and right canine (10-38, 10-35), fracture of the left central incisor, and suspected traumatic extraction of the right central incisor. No other acute facial bone fracture is seen. There is no evidence of mandibular dislocation. There is no suspicious osseous lesion. Orbits: The globes are intact with bilateral lens implants in place. There is no retrobulbar hematoma. Sinuses: There is mild mucosal thickening in the paranasal sinuses. Soft tissues: Unremarkable Other: There is extensive dental disease primarily involving the maxillary teeth. CT CERVICAL SPINE FINDINGS Alignment: There is no significant antero or retrolisthesis. There is no jumped or perched facet or other evidence of traumatic malalignment. Skull base and vertebrae: Skull base alignment is maintained. Vertebral body heights are preserved. There is diffuse sclerosis in the imaged vertebral bodies, similar to 2018  and nonspecific. There is no focal suspicious osseous lesion. Soft tissues and spinal canal: No prevertebral fluid or swelling. No visible canal hematoma. Disc levels: There is multilevel disc space narrowing and degenerative endplate change, most advanced at C3-C4, C5-C6, and C6-C7. There is no evidence of high-grade spinal canal stenosis. Upper chest: The imaged lung apices are clear. Other: None. IMPRESSION: 1. No acute intracranial pathology. 2. Acute fracture of the maxilla anteriorly with anterior displacement of the roots of the right lateral incisor and right canine, fracture of the left central incisor, and suspected traumatic extraction of the right central incisor. 3. No acute fracture or traumatic malalignment of the cervical spine. Electronically Signed   By: [name] M.D.   On: 02/18/2022 16:54   CT Maxillofacial Wo Contrast  Result Date: 02/18/2022 CLINICAL DATA:  Syncope, landed on face. EXAM: CT HEAD WITHOUT CONTRAST CT MAXILLOFACIAL WITHOUT CONTRAST CT CERVICAL SPINE WITHOUT CONTRAST TECHNIQUE: Multidetector CT imaging of the head, cervical spine, and maxillofacial structures were performed using the standard protocol without intravenous contrast. Multiplanar CT image reconstructions of the cervical spine and maxillofacial structures were also generated. RADIATION DOSE REDUCTION: This exam was performed according to the departmental dose-optimization program which includes automated exposure control, adjustment of the mA and/or kV according to patient size and/or use of iterative reconstruction technique. COMPARISON:  CTA head/neck 10/20/2016 FINDINGS: CT HEAD FINDINGS Brain: There is no acute intracranial hemorrhage, extra-axial fluid collection, or acute infarct Parenchymal volume is within normal limits for age. The ventricles are normal in size. Gray-white differentiation is preserved. The pituitary and suprasellar region are normal. There is no mass lesion. There is no mass effect or  midline shift. Vascular: No hyperdense vessel or unexpected calcification. Skull: Normal. Negative for fracture or focal lesion. Other: None. CT MAXILLOFACIAL FINDINGS Osseous: There is an acute fracture of the maxilla anteriorly with anterior displacement of the roots of the right lateral incisor and right canine (10-38, 10-35), fracture of the left central incisor, and suspected traumatic extraction of the right central incisor. No other acute facial bone fracture is seen. There is no evidence of mandibular dislocation. There is no suspicious osseous lesion. Orbits: The globes are intact with bilateral lens implants in place. There is no retrobulbar hematoma. Sinuses: There is mild mucosal thickening in the paranasal sinuses. Soft tissues: Unremarkable Other:  There is extensive dental disease primarily involving the maxillary teeth. CT CERVICAL SPINE FINDINGS Alignment: There is no significant antero or retrolisthesis. There is no jumped or perched facet or other evidence of traumatic malalignment. Skull base and vertebrae: Skull base alignment is maintained. Vertebral body heights are preserved. There is diffuse sclerosis in the imaged vertebral bodies, similar to 2018 and nonspecific. There is no focal suspicious osseous lesion. Soft tissues and spinal canal: No prevertebral fluid or swelling. No visible canal hematoma. Disc levels: There is multilevel disc space narrowing and degenerative endplate change, most advanced at C3-C4, C5-C6, and C6-C7. There is no evidence of high-grade spinal canal stenosis. Upper chest: The imaged lung apices are clear. Other: None. IMPRESSION: 1. No acute intracranial pathology. 2. Acute fracture of the maxilla anteriorly with anterior displacement of the roots of the right lateral incisor and right canine, fracture of the left central incisor, and suspected traumatic extraction of the right central incisor. 3. No acute fracture or traumatic malalignment of the cervical spine.  Electronically Signed   By: [name] M.D.   On: 02/18/2022 16:54   CT Cervical Spine Wo Contrast  Result Date: 02/18/2022 CLINICAL DATA:  Syncope, landed on face. EXAM: CT HEAD WITHOUT CONTRAST CT MAXILLOFACIAL WITHOUT CONTRAST CT CERVICAL SPINE WITHOUT CONTRAST TECHNIQUE: Multidetector CT imaging of the head, cervical spine, and maxillofacial structures were performed using the standard protocol without intravenous contrast. Multiplanar CT image reconstructions of the cervical spine and maxillofacial structures were also generated. RADIATION DOSE REDUCTION: This exam was performed according to the departmental dose-optimization program which includes automated exposure control, adjustment of the mA and/or kV according to patient size and/or use of iterative reconstruction technique. COMPARISON:  CTA head/neck 10/20/2016 FINDINGS: CT HEAD FINDINGS Brain: There is no acute intracranial hemorrhage, extra-axial fluid collection, or acute infarct Parenchymal volume is within normal limits for age. The ventricles are normal in size. Gray-white differentiation is preserved. The pituitary and suprasellar region are normal. There is no mass lesion. There is no mass effect or midline shift. Vascular: No hyperdense vessel or unexpected calcification. Skull: Normal. Negative for fracture or focal lesion. Other: None. CT MAXILLOFACIAL FINDINGS Osseous: There is an acute fracture of the maxilla anteriorly with anterior displacement of the roots of the right lateral incisor and right canine (10-38, 10-35), fracture of the left central incisor, and suspected traumatic extraction of the right central incisor. No other acute facial bone fracture is seen. There is no evidence of mandibular dislocation. There is no suspicious osseous lesion. Orbits: The globes are intact with bilateral lens implants in place. There is no retrobulbar hematoma. Sinuses: There is mild mucosal thickening in the paranasal sinuses. Soft tissues:  Unremarkable Other: There is extensive dental disease primarily involving the maxillary teeth. CT CERVICAL SPINE FINDINGS Alignment: There is no significant antero or retrolisthesis. There is no jumped or perched facet or other evidence of traumatic malalignment. Skull base and vertebrae: Skull base alignment is maintained. Vertebral body heights are preserved. There is diffuse sclerosis in the imaged vertebral bodies, similar to 2018 and nonspecific. There is no focal suspicious osseous lesion. Soft tissues and spinal canal: No prevertebral fluid or swelling. No visible canal hematoma. Disc levels: There is multilevel disc space narrowing and degenerative endplate change, most advanced at C3-C4, C5-C6, and C6-C7. There is no evidence of high-grade spinal canal stenosis. Upper chest: The imaged lung apices are clear. Other: None. IMPRESSION: 1. No acute intracranial pathology. 2. Acute fracture of the maxilla anteriorly with  anterior displacement of the roots of the right lateral incisor and right canine, fracture of the left central incisor, and suspected traumatic extraction of the right central incisor. 3. No acute fracture or traumatic malalignment of the cervical spine. Electronically Signed   By: [name] M.D.   On: 02/18/2022 16:54   DG Pelvis 1-2 Views  Result Date: 02/18/2022 CLINICAL DATA:  Chest pain syncopal episode EXAM: PELVIS - 1-2 VIEW COMPARISON:  12/21/2017 FINDINGS: SI joint degenerative changes. Pubic symphysis and rami appear intact. No fracture or malalignment IMPRESSION: No acute osseous abnormality Electronically Signed   By: Donavan Foil M.D.   On: 02/18/2022 16:22   DG Chest 1 View  Result Date: 02/18/2022 CLINICAL DATA:  Chest pain EXAM: CHEST  1 VIEW COMPARISON:  11/22/2017 FINDINGS: Suspect trace right pleural effusion. No consolidation or effusion. Normal cardiac size. No pneumothorax. High-riding right humeral head right greater than left humeral heads consistent with  rotator cuff disease. Degenerative changes at the right greater than left shoulders. IMPRESSION: Suspect trace right pleural effusion. Electronically Signed   By: Donavan Foil M.D.   On: 02/18/2022 16:21

## 2022-02-20 NOTE — Progress Notes (Signed)
Echocardiogram 2D Echocardiogram has been performed.  Michael [name] 02/20/2022, 9:08 AM

## 2022-02-20 NOTE — Assessment & Plan Note (Signed)
Blood pressure controlled - Continue ramipril, Lasix

## 2022-02-20 NOTE — ED Notes (Signed)
ED TO INPATIENT HANDOFF REPORT  ED Nurse Name and Phone #: [place] Rn  S Name/Age/Gender Jillyn Hidden 89 y.o. male Room/Bed: WA12/WA12  Code Status   Code Status: Full Code  Home/SNF/Other Home Patient oriented to: self, place, time, and situation Is this baseline? Yes   Triage Complete: Triage complete  Chief Complaint Acute congestive heart failure ([place]) [I50.9]  Triage Note Pt arrives via EMS from his PCP's office. Pt was seen for some swelling to bilateral legs. When he was leaving the doctor's office, the patient had syncopal episode and landed face first. EMS states he lost approximately two or three teeth from the fall. A brief loss of consciousness was reported. Pt arrives AxOx4. No blood thinners. Patient is in a c-collar.    Allergies No Known Allergies  Level of Care/Admitting Diagnosis ED Disposition     ED Disposition  Admit   Condition  --   Comment  Hospital Area: [facility] [[id number]]  Level of Care: Telemetry [5]  Admit to tele based on following criteria: Acute CHF  May admit patient to [facility] if equivalent level of care is available:: Yes  Covid Evaluation: Confirmed COVID Negative  Diagnosis: Acute congestive heart failure ([place]) [[id number]]  Admitting Physician: Quintella Baton [[id number]]  Attending Physician: Quintella Baton [[id number]]  Certification:: I certify this patient will need inpatient services for at least 2 midnights  Estimated Length of Stay: 2          B Medical/Surgery History Past Medical History:  Diagnosis Date   Anemia    Anginal pain ([place])    upon exertion   Arthritis    "left shoulder, lower back" (10/20/2016)   CLL (chronic lymphoblastic leukemia) dx'd 2000   Edema leg    Family history of adverse reaction to anesthesia    "daughter's BP drops"    GERD (gastroesophageal reflux disease)    History of hiatal hernia    Leukemia, 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   Lymphocytosis    Type  II diabetes mellitus ([place])    Past Surgical History:  Procedure Laterality Date   BOWEL RESECTION N/A 11/15/2017   Procedure: SMALL BOWEL RESECTION;  Surgeon: Coralie Keens, MD;  Location: [facility];  Service: General;  Laterality: N/A;   CATARACT EXTRACTION W/ INTRAOCULAR LENS  IMPLANT, BILATERAL Bilateral    EYE SURGERY     Cataracts (bilateral)   GROIN DISSECTION Left 11/15/2017   Procedure: GROIN EXPLORATION WITH REMOVAL OF INGUINAL HERNIA MESH;  Surgeon: Coralie Keens, MD;  Location: [facility];  Service: General;  Laterality: Left;   INGUINAL HERNIA REPAIR Left 11/10/2017   Procedure: LEFT [place];  Surgeon: Coralie Keens, MD;  Location: [facility];  Service: General;  Laterality: Left;   INGUINAL HERNIA REPAIR Left 11/15/2017   Procedure: LEFT INGUINAL HERNIA REPAIR WITH MESH;  Surgeon: Coralie Keens, MD;  Location: [facility];  Service: General;  Laterality: Left;   INSERTION OF MESH Left 11/10/2017   Procedure: INSERTION OF MESH;  Surgeon: Coralie Keens, MD;  Location: [facility];  Service: General;  Laterality: Left;     A IV Location/Drains/Wounds Patient Lines/Drains/Airways Status     Active Line/Drains/Airways     Name Placement date Placement time Site Days   Peripheral IV 02/18/22 20 G Right Antecubital 02/18/22  2240  Antecubital  2   Pressure Injury 12/15/17 Unstageable - Full thickness tissue loss in which the base of the ulcer is covered by slough (yellow,  tan, gray, green or brown) and/or eschar (tan, brown or black) in the wound bed. left lateral heel, black area 12/15/17  1727  -- 1528   Pressure Injury 12/16/17 Unstageable - Full thickness tissue loss in which the base of the ulcer is covered by slough (yellow, tan, gray, green or brown) and/or eschar (tan, brown or black) in the wound bed. 12/16/17  0730  -- 1527            Intake/Output Last 24 hours No intake or output data in the 24 hours ending 02/20/22  1109  Labs/Imaging Results for orders placed or performed during the hospital encounter of 02/18/22 (from the past 48 hour(s))  Urinalysis, Routine w reflex microscopic -Urine, Clean Catch     Status: Abnormal   Collection Time: 02/18/22 10:30 PM  Result Value Ref Range   Color, Urine YELLOW YELLOW   APPearance CLEAR CLEAR   Specific Gravity, Urine 1.011 1.005 - 1.030   pH 6.0 5.0 - 8.0   Glucose, UA NEGATIVE NEGATIVE mg/dL   Hgb urine dipstick SMALL (A) NEGATIVE   Bilirubin Urine NEGATIVE NEGATIVE   Ketones, ur NEGATIVE NEGATIVE mg/dL   Protein, ur NEGATIVE NEGATIVE mg/dL   Nitrite NEGATIVE NEGATIVE   Leukocytes,Ua NEGATIVE NEGATIVE   RBC / HPF 0-5 0 - 5 RBC/hpf   WBC, UA 0-5 0 - 5 WBC/hpf   Bacteria, UA NONE SEEN NONE SEEN   Squamous Epithelial / HPF 0-5 0 - 5 /HPF    Comment: Performed at [facility], [place] [address], [place], [place] [address]  Basic metabolic panel     Status: Abnormal   Collection Time: 02/18/22 10:35 PM  Result Value Ref Range   Sodium 138 135 - 145 mmol/L   Potassium 5.0 3.5 - 5.1 mmol/L   Chloride 104 98 - 111 mmol/L   CO2 23 22 - 32 mmol/L   Glucose, Bld 161 (H) 70 - 99 mg/dL    Comment: Glucose reference range applies only to samples taken after fasting for at least 8 hours.   BUN 27 (H) 8 - 23 mg/dL   Creatinine, Ser 1.27 (H) 0.61 - 1.24 mg/dL   Calcium 8.3 (L) 8.9 - 10.3 mg/dL   GFR, Estimated 54 (L) >60 mL/min    Comment: (NOTE) Calculated using the CKD-EPI Creatinine Equation (2021)    Anion gap 11 5 - 15    Comment: Performed at [facility], [place] [address] [place], [place] [address]  CBC     Status: Abnormal   Collection Time: 02/18/22 10:35 PM  Result Value Ref Range   WBC 6.5 4.0 - 10.5 K/uL   RBC 3.30 (L) 4.22 - 5.81 MIL/uL   Hemoglobin 8.6 (L) 13.0 - 17.0 g/dL   HCT 29.0 (L) 39.0 - 52.0 %   MCV 87.9 80.0 - 100.0 fL   MCH 26.1 26.0 - 34.0 pg   MCHC 29.7 (L) 30.0 - 36.0 g/dL   RDW 15.1 11.5 -  15.5 %   Platelets 136 (L) 150 - 400 K/uL   nRBC 0.0 0.0 - 0.2 %    Comment: Performed at [facility], [place] [address] [place], [place] [address]  Troponin I (High Sensitivity)     Status: Abnormal   Collection Time: 02/18/22 10:35 PM  Result Value Ref Range   Troponin I (High Sensitivity) 18 (H) <18 ng/L    Comment: (NOTE) Elevated high sensitivity troponin I (hsTnI) values and significant  changes  across serial measurements may suggest ACS but many other  chronic and acute conditions are known to elevate hsTnI results.  Refer to the "Links" section for chest pain algorithms and additional  guidance. Performed at [facility], [place] [address], [place], [place] [address]   Brain natriuretic peptide     Status: Abnormal   Collection Time: 02/18/22 10:35 PM  Result Value Ref Range   B Natriuretic Peptide 622.1 (H) 0.0 - 100.0 pg/mL    Comment: Performed at [facility], [place] [address] [place], [place] [address]  CBG monitoring, ED     Status: Abnormal   Collection Time: 02/18/22 10:46 PM  Result Value Ref Range   Glucose-Capillary 151 (H) 70 - 99 mg/dL    Comment: Glucose reference range applies only to samples taken after fasting for at least 8 hours.  Troponin I (High Sensitivity)     Status: None   Collection Time: 02/18/22 11:38 PM  Result Value Ref Range   Troponin I (High Sensitivity) 15 <18 ng/L    Comment: (NOTE) Elevated high sensitivity troponin I (hsTnI) values and significant  changes across serial measurements may suggest ACS but many other  chronic and acute conditions are known to elevate hsTnI results.  Refer to the "Links" section for chest pain algorithms and additional  guidance. Performed at [facility], [place] [address] [place], [place] [address]   CBG monitoring, ED     Status: Abnormal   Collection Time: 02/19/22  1:43 AM  Result Value Ref Range   Glucose-Capillary 164 (H) 70 -  99 mg/dL    Comment: Glucose reference range applies only to samples taken after fasting for at least 8 hours.  Basic metabolic panel     Status: Abnormal   Collection Time: 02/19/22  6:20 AM  Result Value Ref Range   Sodium 140 135 - 145 mmol/L   Potassium 4.8 3.5 - 5.1 mmol/L   Chloride 107 98 - 111 mmol/L   CO2 25 22 - 32 mmol/L   Glucose, Bld 137 (H) 70 - 99 mg/dL    Comment: Glucose reference range applies only to samples taken after fasting for at least 8 hours.   BUN 27 (H) 8 - 23 mg/dL   Creatinine, Ser 1.12 0.61 - 1.24 mg/dL   Calcium 8.6 (L) 8.9 - 10.3 mg/dL   GFR, Estimated >60 >60 mL/min    Comment: (NOTE) Calculated using the CKD-EPI Creatinine Equation (2021)    Anion gap 8 5 - 15    Comment: Performed at [facility], [place] [address], [place], [place] [address]  Magnesium     Status: None   Collection Time: 02/19/22  6:20 AM  Result Value Ref Range   Magnesium 2.0 1.7 - 2.4 mg/dL    Comment: Performed at [facility], [place] [address], [place], [place] [address]  Hemoglobin A1c     Status: Abnormal   Collection Time: 02/19/22  6:20 AM  Result Value Ref Range   Hgb A1c MFr Bld 6.5 (H) 4.8 - 5.6 %    Comment: (NOTE) Pre diabetes:          5.7%-6.4%  Diabetes:              >6.4%  Glycemic control for   <7.0% adults with diabetes    Mean Plasma Glucose 139.85 mg/dL    Comment: Performed at [place] [address], [place], [place] [address]  CBG monitoring, ED     Status: Abnormal   Collection Time: 02/19/22  7:34 AM  Result Value Ref Range   Glucose-Capillary 124 (H) 70 - 99 mg/dL    Comment: Glucose reference range applies only to samples taken after fasting for at least 8 hours.  CBG monitoring, ED     Status: Abnormal   Collection Time: 02/19/22  1:43 PM  Result Value Ref Range   Glucose-Capillary 156 (H) 70 - 99 mg/dL    Comment: Glucose reference range applies only to samples taken after fasting for at  least 8 hours.  CBG monitoring, ED     Status: Abnormal   Collection Time: 02/19/22  5:13 PM  Result Value Ref Range   Glucose-Capillary 140 (H) 70 - 99 mg/dL    Comment: Glucose reference range applies only to samples taken after fasting for at least 8 hours.  CBG monitoring, ED     Status: Abnormal   Collection Time: 02/19/22 10:20 PM  Result Value Ref Range   Glucose-Capillary 186 (H) 70 - 99 mg/dL    Comment: Glucose reference range applies only to samples taken after fasting for at least 8 hours.  Iron and TIBC     Status: Abnormal   Collection Time: 02/20/22  6:50 AM  Result Value Ref Range   Iron 40 (L) 45 - 182 ug/dL   TIBC 419 250 - 450 ug/dL   Saturation Ratios 10 (L) 17.9 - 39.5 %   UIBC 379 ug/dL    Comment: Performed at [facility], [place] [address] [place], [place] [address]  Ferritin     Status: Abnormal   Collection Time: 02/20/22  6:50 AM  Result Value Ref Range   Ferritin 13 (L) 24 - 336 ng/mL    Comment: Performed at [facility], [place] [address] [place], [place] [address]  Vitamin B12     Status: None   Collection Time: 02/20/22  6:50 AM  Result Value Ref Range   Vitamin B-12 525 180 - 914 pg/mL    Comment: (NOTE) This assay is not validated for testing neonatal or myeloproliferative syndrome specimens for Vitamin B12 levels. Performed at [facility], [place] [address], [place], [place] [address]   CBC     Status: Abnormal   Collection Time: 02/20/22  6:50 AM  Result Value Ref Range   WBC 5.8 4.0 - 10.5 K/uL   RBC 3.08 (L) 4.22 - 5.81 MIL/uL   Hemoglobin 8.1 (L) 13.0 - 17.0 g/dL   HCT 26.4 (L) 39.0 - 52.0 %   MCV 85.7 80.0 - 100.0 fL   MCH 26.3 26.0 - 34.0 pg   MCHC 30.7 30.0 - 36.0 g/dL   RDW 15.1 11.5 - 15.5 %   Platelets 123 (L) 150 - 400 K/uL   nRBC 0.0 0.0 - 0.2 %    Comment: Performed at [facility], [place] [address], [place], [place] [address]  Basic metabolic panel      Status: Abnormal   Collection Time: 02/20/22  7:32 AM  Result Value Ref Range   Sodium 139 135 - 145 mmol/L   Potassium 4.3 3.5 - 5.1 mmol/L   Chloride 106 98 - 111 mmol/L   CO2 26 22 - 32 mmol/L   Glucose, Bld 162 (H) 70 - 99 mg/dL    Comment: Glucose reference range applies only to samples taken after fasting for at least 8 hours.   BUN 22 8 -  23 mg/dL   Creatinine, Ser 1.10 0.61 - 1.24 mg/dL   Calcium 8.9 8.9 - 10.3 mg/dL   GFR, Estimated >60 >60 mL/min    Comment: (NOTE) Calculated using the CKD-EPI Creatinine Equation (2021)    Anion gap 7 5 - 15    Comment: Performed at [facility], [place] [address], [place], [place] [address]  CBG monitoring, ED     Status: Abnormal   Collection Time: 02/20/22  8:19 AM  Result Value Ref Range   Glucose-Capillary 174 (H) 70 - 99 mg/dL    Comment: Glucose reference range applies only to samples taken after fasting for at least 8 hours.   ECHOCARDIOGRAM COMPLETE  Result Date: 02/20/2022    ECHOCARDIOGRAM REPORT   Patient Name:   DEONTE OTTING Date of Exam: 02/20/2022 Medical Rec #:  [id number]       Height:       67.0 in Accession #:    [id number]      Weight:       225.0 lb Date of Birth:  March 04, 1930       BSA:          2.126 m Patient Age:    67 years        BP:           123/59 mmHg Patient Gender: M               HR:           72 bpm. Exam Location:  Inpatient Procedure: 2D Echo, Cardiac Doppler and Color Doppler Indications:    CHF-Acute Diastolic G92.11  History:        Patient has prior history of Echocardiogram examinations, most                 recent 04/19/2017. CHF, Signs/Symptoms:Hypotension and Syncope;                 Risk Factors:Hypertension. CKD, stage III.  Sonographer:    Ronny Flurry Referring Phys: [phone] DEBBY CROSLEY IMPRESSIONS  1. Left ventricular ejection fraction, by estimation, is 60 to 65%. The left ventricle has normal function. The left ventricle has no regional wall motion abnormalities. Left  ventricular diastolic parameters were normal.  2. Right ventricular systolic function is normal. The right ventricular size is normal.  3. Left atrial size was mildly dilated.  4. The mitral valve is abnormal. Mild mitral valve regurgitation. No evidence of mitral stenosis.  5. The aortic valve is tricuspid. There is moderate calcification of the aortic valve. There is moderate thickening of the aortic valve. Aortic valve regurgitation is not visualized. Aortic valve sclerosis is present, with no evidence of aortic valve stenosis.  6. The inferior vena cava is normal in size with greater than 50% respiratory variability, suggesting right atrial pressure of 3 mmHg. FINDINGS  Left Ventricle: Left ventricular ejection fraction, by estimation, is 60 to 65%. The left ventricle has normal function. The left ventricle has no regional wall motion abnormalities. The left ventricular internal cavity size was normal in size. There is  no left ventricular hypertrophy. Left ventricular diastolic parameters were normal. Right Ventricle: The right ventricular size is normal. No increase in right ventricular wall thickness. Right ventricular systolic function is normal. Left Atrium: Left atrial size was mildly dilated. Right Atrium: Right atrial size was normal in size. Pericardium: There is no evidence of pericardial effusion. Mitral Valve: The mitral valve is abnormal. There is mild thickening of the mitral  valve leaflet(s). There is mild calcification of the mitral valve leaflet(s). Mild mitral annular calcification. Mild mitral valve regurgitation. No evidence of mitral valve stenosis. Tricuspid Valve: The tricuspid valve is normal in structure. Tricuspid valve regurgitation is mild . No evidence of tricuspid stenosis. Aortic Valve: The aortic valve is tricuspid. There is moderate calcification of the aortic valve. There is moderate thickening of the aortic valve. Aortic valve regurgitation is not visualized. Aortic valve  sclerosis is present, with no evidence of aortic valve stenosis. Aortic valve mean gradient measures 4.0 mmHg. Aortic valve peak gradient measures 8.0 mmHg. Aortic valve area, by VTI measures 2.34 cm. Pulmonic Valve: The pulmonic valve was normal in structure. Pulmonic valve regurgitation is not visualized. No evidence of pulmonic stenosis. Aorta: The aortic root is normal in size and structure. Venous: The inferior vena cava is normal in size with greater than 50% respiratory variability, suggesting right atrial pressure of 3 mmHg. IAS/Shunts: No atrial level shunt detected by color flow Doppler.  LEFT VENTRICLE PLAX 2D LVIDd:         4.80 cm   Diastology LVIDs:         2.50 cm   LV e' medial:    5.55 cm/s LV PW:         0.80 cm   LV E/e' medial:  20.5 LV IVS:        0.60 cm   LV e' lateral:   8.92 cm/s LVOT diam:     2.00 cm   LV E/e' lateral: 12.8 LV SV:         70 LV SV Index:   33 LVOT Area:     3.14 cm  RIGHT VENTRICLE            IVC RV S prime:     9.57 cm/s  IVC diam: 2.20 cm TAPSE (M-mode): 2.4 cm LEFT ATRIUM             Index        RIGHT ATRIUM           Index LA diam:        4.10 cm 1.93 cm/m   RA Area:     12.40 cm LA Vol (A2C):   56.2 ml 26.43 ml/m  RA Volume:   23.20 ml  10.91 ml/m LA Vol (A4C):   60.7 ml 28.55 ml/m LA Biplane Vol: 58.3 ml 27.42 ml/m  AORTIC VALVE AV Area (Vmax):    2.25 cm AV Area (Vmean):   1.99 cm AV Area (VTI):     2.34 cm AV Vmax:           141.00 cm/s AV Vmean:          94.400 cm/s AV VTI:            0.298 m AV Peak Grad:      8.0 mmHg AV Mean Grad:      4.0 mmHg LVOT Vmax:         100.80 cm/s LVOT Vmean:        59.850 cm/s LVOT VTI:          0.222 m LVOT/AV VTI ratio: 0.74  AORTA Ao Root diam: 3.60 cm Ao Asc diam:  3.20 cm MITRAL VALVE                TRICUSPID VALVE MV Area (PHT): 3.91 cm     TR Peak grad:   24.8 mmHg MV Decel Time: 194 msec  TR Vmax:        249.00 cm/s MV E velocity: 114.00 cm/s MV A velocity: 68.10 cm/s   SHUNTS MV E/A ratio:  1.67          Systemic VTI:  0.22 m                             Systemic Diam: 2.00 cm Jenkins Rouge MD Electronically signed by Jenkins Rouge MD Signature Date/Time: 02/20/2022/9:57:13 AM    Final    CT Head Wo Contrast  Result Date: 02/18/2022 CLINICAL DATA:  Syncope, landed on face. EXAM: CT HEAD WITHOUT CONTRAST CT MAXILLOFACIAL WITHOUT CONTRAST CT CERVICAL SPINE WITHOUT CONTRAST TECHNIQUE: Multidetector CT imaging of the head, cervical spine, and maxillofacial structures were performed using the standard protocol without intravenous contrast. Multiplanar CT image reconstructions of the cervical spine and maxillofacial structures were also generated. RADIATION DOSE REDUCTION: This exam was performed according to the departmental dose-optimization program which includes automated exposure control, adjustment of the mA and/or kV according to patient size and/or use of iterative reconstruction technique. COMPARISON:  CTA head/neck 10/20/2016 FINDINGS: CT HEAD FINDINGS Brain: There is no acute intracranial hemorrhage, extra-axial fluid collection, or acute infarct Parenchymal volume is within normal limits for age. The ventricles are normal in size. Gray-white differentiation is preserved. The pituitary and suprasellar region are normal. There is no mass lesion. There is no mass effect or midline shift. Vascular: No hyperdense vessel or unexpected calcification. Skull: Normal. Negative for fracture or focal lesion. Other: None. CT MAXILLOFACIAL FINDINGS Osseous: There is an acute fracture of the maxilla anteriorly with anterior displacement of the roots of the right lateral incisor and right canine (10-38, 10-35), fracture of the left central incisor, and suspected traumatic extraction of the right central incisor. No other acute facial bone fracture is seen. There is no evidence of mandibular dislocation. There is no suspicious osseous lesion. Orbits: The globes are intact with bilateral lens implants in place. There is no  retrobulbar hematoma. Sinuses: There is mild mucosal thickening in the paranasal sinuses. Soft tissues: Unremarkable Other: There is extensive dental disease primarily involving the maxillary teeth. CT CERVICAL SPINE FINDINGS Alignment: There is no significant antero or retrolisthesis. There is no jumped or perched facet or other evidence of traumatic malalignment. Skull base and vertebrae: Skull base alignment is maintained. Vertebral body heights are preserved. There is diffuse sclerosis in the imaged vertebral bodies, similar to 2018 and nonspecific. There is no focal suspicious osseous lesion. Soft tissues and spinal canal: No prevertebral fluid or swelling. No visible canal hematoma. Disc levels: There is multilevel disc space narrowing and degenerative endplate change, most advanced at C3-C4, C5-C6, and C6-C7. There is no evidence of high-grade spinal canal stenosis. Upper chest: The imaged lung apices are clear. Other: None. IMPRESSION: 1. No acute intracranial pathology. 2. Acute fracture of the maxilla anteriorly with anterior displacement of the roots of the right lateral incisor and right canine, fracture of the left central incisor, and suspected traumatic extraction of the right central incisor. 3. No acute fracture or traumatic malalignment of the cervical spine. Electronically Signed   By: [name] M.D.   On: 02/18/2022 16:54   CT Maxillofacial Wo Contrast  Result Date: 02/18/2022 CLINICAL DATA:  Syncope, landed on face. EXAM: CT HEAD WITHOUT CONTRAST CT MAXILLOFACIAL WITHOUT CONTRAST CT CERVICAL SPINE WITHOUT CONTRAST TECHNIQUE: Multidetector CT imaging of the head, cervical spine, and maxillofacial structures were performed  using the standard protocol without intravenous contrast. Multiplanar CT image reconstructions of the cervical spine and maxillofacial structures were also generated. RADIATION DOSE REDUCTION: This exam was performed according to the departmental dose-optimization program  which includes automated exposure control, adjustment of the mA and/or kV according to patient size and/or use of iterative reconstruction technique. COMPARISON:  CTA head/neck 10/20/2016 FINDINGS: CT HEAD FINDINGS Brain: There is no acute intracranial hemorrhage, extra-axial fluid collection, or acute infarct Parenchymal volume is within normal limits for age. The ventricles are normal in size. Gray-white differentiation is preserved. The pituitary and suprasellar region are normal. There is no mass lesion. There is no mass effect or midline shift. Vascular: No hyperdense vessel or unexpected calcification. Skull: Normal. Negative for fracture or focal lesion. Other: None. CT MAXILLOFACIAL FINDINGS Osseous: There is an acute fracture of the maxilla anteriorly with anterior displacement of the roots of the right lateral incisor and right canine (10-38, 10-35), fracture of the left central incisor, and suspected traumatic extraction of the right central incisor. No other acute facial bone fracture is seen. There is no evidence of mandibular dislocation. There is no suspicious osseous lesion. Orbits: The globes are intact with bilateral lens implants in place. There is no retrobulbar hematoma. Sinuses: There is mild mucosal thickening in the paranasal sinuses. Soft tissues: Unremarkable Other: There is extensive dental disease primarily involving the maxillary teeth. CT CERVICAL SPINE FINDINGS Alignment: There is no significant antero or retrolisthesis. There is no jumped or perched facet or other evidence of traumatic malalignment. Skull base and vertebrae: Skull base alignment is maintained. Vertebral body heights are preserved. There is diffuse sclerosis in the imaged vertebral bodies, similar to 2018 and nonspecific. There is no focal suspicious osseous lesion. Soft tissues and spinal canal: No prevertebral fluid or swelling. No visible canal hematoma. Disc levels: There is multilevel disc space narrowing and  degenerative endplate change, most advanced at C3-C4, C5-C6, and C6-C7. There is no evidence of high-grade spinal canal stenosis. Upper chest: The imaged lung apices are clear. Other: None. IMPRESSION: 1. No acute intracranial pathology. 2. Acute fracture of the maxilla anteriorly with anterior displacement of the roots of the right lateral incisor and right canine, fracture of the left central incisor, and suspected traumatic extraction of the right central incisor. 3. No acute fracture or traumatic malalignment of the cervical spine. Electronically Signed   By: [name] M.D.   On: 02/18/2022 16:54   CT Cervical Spine Wo Contrast  Result Date: 02/18/2022 CLINICAL DATA:  Syncope, landed on face. EXAM: CT HEAD WITHOUT CONTRAST CT MAXILLOFACIAL WITHOUT CONTRAST CT CERVICAL SPINE WITHOUT CONTRAST TECHNIQUE: Multidetector CT imaging of the head, cervical spine, and maxillofacial structures were performed using the standard protocol without intravenous contrast. Multiplanar CT image reconstructions of the cervical spine and maxillofacial structures were also generated. RADIATION DOSE REDUCTION: This exam was performed according to the departmental dose-optimization program which includes automated exposure control, adjustment of the mA and/or kV according to patient size and/or use of iterative reconstruction technique. COMPARISON:  CTA head/neck 10/20/2016 FINDINGS: CT HEAD FINDINGS Brain: There is no acute intracranial hemorrhage, extra-axial fluid collection, or acute infarct Parenchymal volume is within normal limits for age. The ventricles are normal in size. Gray-white differentiation is preserved. The pituitary and suprasellar region are normal. There is no mass lesion. There is no mass effect or midline shift. Vascular: No hyperdense vessel or unexpected calcification. Skull: Normal. Negative for fracture or focal lesion. Other: None. CT MAXILLOFACIAL FINDINGS Osseous:  There is an acute fracture of the  maxilla anteriorly with anterior displacement of the roots of the right lateral incisor and right canine (10-38, 10-35), fracture of the left central incisor, and suspected traumatic extraction of the right central incisor. No other acute facial bone fracture is seen. There is no evidence of mandibular dislocation. There is no suspicious osseous lesion. Orbits: The globes are intact with bilateral lens implants in place. There is no retrobulbar hematoma. Sinuses: There is mild mucosal thickening in the paranasal sinuses. Soft tissues: Unremarkable Other: There is extensive dental disease primarily involving the maxillary teeth. CT CERVICAL SPINE FINDINGS Alignment: There is no significant antero or retrolisthesis. There is no jumped or perched facet or other evidence of traumatic malalignment. Skull base and vertebrae: Skull base alignment is maintained. Vertebral body heights are preserved. There is diffuse sclerosis in the imaged vertebral bodies, similar to 2018 and nonspecific. There is no focal suspicious osseous lesion. Soft tissues and spinal canal: No prevertebral fluid or swelling. No visible canal hematoma. Disc levels: There is multilevel disc space narrowing and degenerative endplate change, most advanced at C3-C4, C5-C6, and C6-C7. There is no evidence of high-grade spinal canal stenosis. Upper chest: The imaged lung apices are clear. Other: None. IMPRESSION: 1. No acute intracranial pathology. 2. Acute fracture of the maxilla anteriorly with anterior displacement of the roots of the right lateral incisor and right canine, fracture of the left central incisor, and suspected traumatic extraction of the right central incisor. 3. No acute fracture or traumatic malalignment of the cervical spine. Electronically Signed   By: [name] M.D.   On: 02/18/2022 16:54   DG Pelvis 1-2 Views  Result Date: 02/18/2022 CLINICAL DATA:  Chest pain syncopal episode EXAM: PELVIS - 1-2 VIEW COMPARISON:  12/21/2017  FINDINGS: SI joint degenerative changes. Pubic symphysis and rami appear intact. No fracture or malalignment IMPRESSION: No acute osseous abnormality Electronically Signed   By: Donavan Foil M.D.   On: 02/18/2022 16:22   DG Chest 1 View  Result Date: 02/18/2022 CLINICAL DATA:  Chest pain EXAM: CHEST  1 VIEW COMPARISON:  11/22/2017 FINDINGS: Suspect trace right pleural effusion. No consolidation or effusion. Normal cardiac size. No pneumothorax. High-riding right humeral head right greater than left humeral heads consistent with rotator cuff disease. Degenerative changes at the right greater than left shoulders. IMPRESSION: Suspect trace right pleural effusion. Electronically Signed   By: Donavan Foil M.D.   On: 02/18/2022 16:21    Pending Labs Unresulted Labs (From admission, onward)    None       Vitals/Pain Today's Vitals   02/20/22 0530 02/20/22 0800 02/20/22 0821 02/20/22 1100  BP: (!) 123/59 (!) 114/91  127/61  Pulse: 67 (!) 54  62  Resp: '16 17  16  '$ Temp:   98.2 F (36.8 C) 97.9 F (36.6 C)  TempSrc:   Oral Oral  SpO2: 99% 99%  99%  Weight:      Height:      PainSc:        Isolation Precautions No active isolations  Medications Medications  sodium chloride flush (NS) 0.9 % injection 3 mL (3 mLs Intravenous Given 02/19/22 2226)  sodium chloride flush (NS) 0.9 % injection 3 mL (has no administration in time range)  0.9 %  sodium chloride infusion (has no administration in time range)  acetaminophen (TYLENOL) tablet 650 mg (has no administration in time range)  ondansetron (ZOFRAN) injection 4 mg (has no administration in time range)  heparin injection 5,000 Units (5,000 Units Subcutaneous Given 02/19/22 2049)  furosemide (LASIX) injection 40 mg (40 mg Intravenous Given 02/19/22 2218)  insulin aspart protamine- aspart (NOVOLOG MIX 70/30) injection 35 Units (35 Units Subcutaneous Patient Refused/Not Given 02/19/22 2052)  pantoprazole (PROTONIX) EC tablet 40 mg (40 mg Oral  Given 02/19/22 1048)  ramipril (ALTACE) capsule 5 mg (5 mg Oral Given 02/19/22 1138)  tamsulosin (FLOMAX) capsule 0.4 mg (0.4 mg Oral Given 02/19/22 1048)  insulin aspart (novoLOG) injection 0-15 Units (3 Units Subcutaneous Given 02/20/22 0845)  insulin aspart (novoLOG) injection 0-5 Units ( Subcutaneous Not Given 02/19/22 2106)  calcium carbonate (TUMS - dosed in mg elemental calcium) chewable tablet 400 mg of elemental calcium (400 mg of elemental calcium Oral Given 02/19/22 2049)  famotidine (PEPCID) tablet 10 mg (has no administration in time range)  morphine (PF) 2 MG/ML injection 2 mg (2 mg Intravenous Given 02/18/22 2304)  furosemide (LASIX) injection 40 mg (40 mg Intravenous Given 02/19/22 0005)  oxyCODONE (Oxy IR/ROXICODONE) immediate release tablet 5 mg (5 mg Oral Given 02/19/22 2049)    Mobility walks with device     Focused Assessments     R Recommendations: See Admitting Provider Note  Report given to:   Additional Notes:

## 2022-02-20 NOTE — Assessment & Plan Note (Signed)
Outpatient follow-up with oral surgeon

## 2022-02-21 DIAGNOSIS — D630 Anemia in neoplastic disease: Secondary | ICD-10-CM | POA: Diagnosis not present

## 2022-02-21 DIAGNOSIS — S02401A Maxillary fracture, unspecified, initial encounter for closed fracture: Secondary | ICD-10-CM | POA: Diagnosis not present

## 2022-02-21 DIAGNOSIS — I5033 Acute on chronic diastolic (congestive) heart failure: Secondary | ICD-10-CM | POA: Diagnosis not present

## 2022-02-21 DIAGNOSIS — R55 Syncope and collapse: Secondary | ICD-10-CM | POA: Diagnosis not present

## 2022-02-21 LAB — BASIC METABOLIC PANEL
Anion gap: 7 (ref 5–15)
BUN: 19 mg/dL (ref 8–23)
CO2: 26 mmol/L (ref 22–32)
Calcium: 8.6 mg/dL — ABNORMAL LOW (ref 8.9–10.3)
Chloride: 106 mmol/L (ref 98–111)
Creatinine, Ser: 1.02 mg/dL (ref 0.61–1.24)
GFR, Estimated: >60 mL/min (ref >60)
Glucose, Bld: 100 mg/dL — ABNORMAL HIGH (ref 70–99)
Potassium: 4 mmol/L (ref 3.5–5.1)
Sodium: 139 mmol/L (ref 135–145)

## 2022-02-21 LAB — CBC
HCT: 24.4 % — ABNORMAL LOW (ref 39.0–52.0)
Hemoglobin: 7.6 g/dL — ABNORMAL LOW (ref 13.0–17.0)
MCH: 26.8 pg (ref 26.0–34.0)
MCHC: 31.1 g/dL (ref 30.0–36.0)
MCV: 85.9 fL (ref 80.0–100.0)
Platelets: 112 10*3/uL — ABNORMAL LOW (ref 150–400)
RBC: 2.84 MIL/uL — ABNORMAL LOW (ref 4.22–5.81)
RDW: 15 % (ref 11.5–15.5)
WBC: 6.5 10*3/uL (ref 4.0–10.5)
nRBC: 0 % (ref 0.0–0.2)

## 2022-02-21 LAB — GLUCOSE, CAPILLARY
Glucose-Capillary: 113 mg/dL — ABNORMAL HIGH (ref 70–99)
Glucose-Capillary: 181 mg/dL — ABNORMAL HIGH (ref 70–99)
Glucose-Capillary: 191 mg/dL — ABNORMAL HIGH (ref 70–99)
Glucose-Capillary: 150 mg/dL — ABNORMAL HIGH (ref 70–99)

## 2022-02-21 LAB — ABO/RH: ABO/RH(D): O POS

## 2022-02-21 LAB — PREPARE RBC (CROSSMATCH)

## 2022-02-21 MED ORDER — ORAL CARE MOUTH RINSE: 15.0000 mL | OROMUCOSAL | Status: DC | PRN

## 2022-02-21 MED ORDER — FUROSEMIDE 10 MG/ML IJ SOLN
40.0000 mg | Freq: Once | INTRAMUSCULAR | Status: DC
  Filled 2022-02-21: qty 4

## 2022-02-21 MED ORDER — MORPHINE SULFATE 15 MG PO TABS
15.0000 mg | ORAL_TABLET | Freq: Two times a day (BID) | ORAL | Status: DC | PRN
  Administered 2022-02-21 – 2022-02-22 (×3): 15 mg via ORAL
  Filled 2022-02-21 (×3): qty 1

## 2022-02-21 MED ORDER — ASPIRIN 81 MG PO TBEC
81.0000 mg | DELAYED_RELEASE_TABLET | Freq: Every day | ORAL | Status: DC
  Filled 2022-02-21: qty 1

## 2022-02-21 MED ORDER — SODIUM CHLORIDE 0.9% IV SOLUTION: Freq: Once | INTRAVENOUS | Status: AC

## 2022-02-21 MED ORDER — LIP MEDEX EX OINT: TOPICAL_OINTMENT | CUTANEOUS | Status: DC | PRN

## 2022-02-21 NOTE — Progress Notes (Signed)
  Progress Note   Patient: Michael [name] POE:[id number] DOB: 09-08-1930 DOA: 02/18/2022     2 DOS: the patient was seen and examined on 02/21/2022        Brief hospital course: [name] is an 89 y.o. M with CLL, HTN, CKD IIIa baseline 1.1-1.4, DM, obesity BMI 35, and lymphedema who presented after a fall.  Had been having weight gain from 213 to 225, as well as progressive DOE and new wheeze/cough.  Tried to treat with oral Lasix but failed outpatient treatment.  Finally, fell when leaving the doctor's office, and was sent to the ER.    1/25: Admitted on diuretics     Assessment and Plan: * Acute on chronic diastolic CHF (congestive heart failure) ([place]) Presented with 12 pounds weight gain, dyspnea on exertion, nocturnal cough, pleural effusion, elevated BNP.  Failed outpatient management with increased diuretics.    Echo showed preserved EF.  No significant valvular disease.  BP soft and orthostatic yesterday, diuretics held.  BP still soft today - Transfuse blood  - Hold Lasix, torsemide - Continue monitoring potassium - Daily BMP, strict ins and outs, daily weights    Maxillary fracture ([place]) - Outpatient follow-up with oral surgeon  Syncope and collapse Brief SVT on tele, 2-3 seconds only, asymptomatic. ECG on admission normal, echo normal.  Somewhat bradycardic here, but no further dizziness or syncope.  ORthostatic only after diuresis.   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 Hgb baseline is 9-10, here down to 7.6 today - Transfused iron yesterday - Transfuse 1 unit today  Obesity (BMI 30-39.9) BMI 35  Stage 3a chronic kidney disease (CKD) ([facility]) Relative to baseline, 1.1  Type II diabetes mellitus ([facility]) Glucose controlled - Continue sliding scale corrections - Continue 70/30  Malnutrition of moderate degree As evidenced by mild loss of subcutaneous muscle mass and fat diffusely, chronic kidney disease, heart failure  Essential  hypertension BP soft - Hold ramipril and Lasix  CLL (chronic lymphocytic leukemia) ([place]) - Consult Oncology          Subjective: Feels well, pain improving, swelling better.  No dizzines, no syncope, no chst pain, no dyspnea.     Physical Exam: BP (!) 94/45   Pulse (!) 58   Temp (!) 97.5 F (36.4 C) (Oral)   Resp 16   Ht '5\' 7"'$  (1.702 m)   Wt 91.4 kg   SpO2 99%   BMI 31.56 kg/m   Elderly adult male, sitting up in recliner, interactive and appropriate, missing front teeth RRR, still has brawny changes bilateral lower extremities, right worse than left, still some pitting edema but improved from prior Respiratory rate normal, lungs clear without rales or wheezes Abdomen soft no tenderness palpation or guarding Attention normal, affect appropriate, judgment insight appear normal, face symmetric, speech fluent    Data Reviewed: Ferritin and iron saturation low Hemoglobin down to 7.6 Basic metabolic panel shows normal potassium and creatinine  Family Communication: Daughter at the bedside    Disposition: Status is: Inpatient Patient was admitted with congestive heart failure and anemia  We finished diuresis and giving some blood today  His blood pressure remains somewhat low, given his age 80, his recent fall, fell at discharge with low blood pressure today would be unnecessarily risky        Author: [name], MD 02/21/2022 4:06 PM  For on call review [email or web address].

## 2022-02-21 NOTE — Progress Notes (Signed)
Mobility Specialist - Progress Note   02/21/22 1146  Mobility  Activity Ambulated with assistance in hallway  Level of Assistance Modified independent, requires aide device or extra time  Assistive Device Front wheel walker  Distance Ambulated (ft) 500 ft  Range of Motion/Exercises Active  Activity Response Tolerated well  Mobility Referral Yes  $Mobility charge 1 Mobility   Pt was found on recliner chair and agreeable to ambulate. Had no complaints during session and at EOS returned to recliner chair with all necessities in reach. Daughter in room.  Ferd Hibbs Mobility Specialist

## 2022-02-21 NOTE — Progress Notes (Signed)
Patient accidentally removed IV when moving. MD aware. OK to remain without IV access at this time.

## 2022-02-22 DIAGNOSIS — I5033 Acute on chronic diastolic (congestive) heart failure: Secondary | ICD-10-CM | POA: Diagnosis not present

## 2022-02-22 LAB — CBC
HCT: 27.1 % — ABNORMAL LOW (ref 39.0–52.0)
Hemoglobin: 8.4 g/dL — ABNORMAL LOW (ref 13.0–17.0)
MCH: 27 pg (ref 26.0–34.0)
MCHC: 31 g/dL (ref 30.0–36.0)
MCV: 87.1 fL (ref 80.0–100.0)
Platelets: 102 10*3/uL — ABNORMAL LOW (ref 150–400)
RBC: 3.11 MIL/uL — ABNORMAL LOW (ref 4.22–5.81)
RDW: 15.1 % (ref 11.5–15.5)
WBC: 7.3 10*3/uL (ref 4.0–10.5)
nRBC: 0 % (ref 0.0–0.2)

## 2022-02-22 LAB — BASIC METABOLIC PANEL
Anion gap: 7 (ref 5–15)
BUN: 17 mg/dL (ref 8–23)
CO2: 26 mmol/L (ref 22–32)
Calcium: 8.2 mg/dL — ABNORMAL LOW (ref 8.9–10.3)
Chloride: 106 mmol/L (ref 98–111)
Creatinine, Ser: 1.11 mg/dL (ref 0.61–1.24)
GFR, Estimated: >60 mL/min (ref >60)
Glucose, Bld: 116 mg/dL — ABNORMAL HIGH (ref 70–99)
Potassium: 4.5 mmol/L (ref 3.5–5.1)
Sodium: 139 mmol/L (ref 135–145)

## 2022-02-22 LAB — GLUCOSE, CAPILLARY
Glucose-Capillary: 119 mg/dL — ABNORMAL HIGH (ref 70–99)
Glucose-Capillary: 173 mg/dL — ABNORMAL HIGH (ref 70–99)

## 2022-02-22 MED ORDER — CEFADROXIL 500 MG PO CAPS: 500.0000 mg | ORAL_CAPSULE | Freq: Two times a day (BID) | ORAL | 0 refills | Status: DC

## 2022-02-22 MED ORDER — GLUCERNA SHAKE PO LIQD: 237.0000 mL | Freq: Two times a day (BID) | ORAL | 0 refills | Status: DC

## 2022-02-22 NOTE — Discharge Summary (Signed)
Physician Discharge Summary   Patient: Michael [name] MRN: [id number] DOB: 08-17-1930  Admit date:     02/18/2022  Discharge date: 02/22/22  Discharge Physician: [name]   PCP: Janie Morning, DO     Recommendations at discharge:  Follow up with PCP Dr. Theda Sers in 1 week for CHF and anemia Dr. Theda Sers: Please refer patient for colonoscopy to evaluate iron deficiency Please check BP and resume ramipril if appropriate  Follow up with Dr. 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 M with CLL, HTN, CKD IIIa baseline 1.1-1.4, DM, obesity BMI 35, and lymphedema who presented after a fall.  Had been having weight gain from 213 to 225, as well as progressive DOE and new wheeze/cough.  Tried to treat with oral Lasix but failed outpatient treatment.  Finally, fell when leaving the doctor's office, and was sent to the ER.      * Acute on chronic diastolic CHF (congestive heart failure) ([place]) Presented with 12 pounds weight gain, dyspnea on exertion, nocturnal cough, pleural effusion, elevated BNP.     Echo showed preserved EF.  No significant valvular disease.  Treated with diuretics and weight down to 200 lbs, and leg swelling improved.    Discharged back on home torsemide.   Maxillary fracture ([place]) During fall, patient suffered nondisplaced maxillary fracture (notes of mandibular fracture are  incorrect) and lost three teeth.    Will need outpatient follow-up with oral surgeon, instructions given.   Syncope and collapse ECG on admission normal, echo normal.  Somewhat bradycardic here, but no further dizziness or syncope and no arrhythmias of consequence on monitoring.  Orthostatic only after diuresis.   Suspect his syncope was from anemia.    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  Discharge Hgb improved.    Discussed with patient, he would be not a good candidate for surgery if he were found to have cancer, but would wish to know. - Referral to GI for evaluation for colonoscopy  Obesity (BMI 30-39.9) BMI 35  Stage 3a chronic kidney disease (CKD) (HCC) Stable relative to baseline, 1.1  Type II diabetes mellitus (HCC) Glucose controlled on 70/30  Malnutrition of moderate degree As evidenced by mild loss of subcutaneous muscle mass and fat diffusely, chronic kidney disease, heart failure  Essential hypertension BP soft.  Ramipril held at d/c.  Torsemide continued at d/c   CLL (chronic lymphocytic leukemia) ([place])            The [facility] Controlled Substances Registry was reviewed for this patient prior to discharge.   Consultants:  Oncology  Procedures performed: Echo  Disposition: Home Diet recommendation:  Regular  DISCHARGE MEDICATION: Allergies as of 02/22/2022   No Known Allergies      Medication List     STOP taking these medications  aspirin EC 81 MG tablet   ramipril 5 MG capsule Commonly known as: ALTACE       TAKE these medications    acetaminophen 650 MG CR tablet Commonly known as: TYLENOL Take 1 tablet (650 mg total) by mouth 2 (two) times daily as needed for pain. What changed: when to take this   cefadroxil 500 MG capsule Commonly known as: DURICEF Take 1 capsule (500 mg total) by mouth 2 (two) times daily.   feeding  supplement (GLUCERNA SHAKE) Liqd Take 237 mLs by mouth 2 (two) times daily between meals.   insulin NPH-regular Human (70-30) 100 UNIT/ML injection Inject 35 Units into the skin 2 (two) times daily.   mirtazapine 15 MG tablet Commonly known as: REMERON Take 1 tablet (15 mg total) by mouth at bedtime.   morphine 15 MG tablet Commonly known as: MSIR Take 15 mg by mouth 2 (two) times daily.   pantoprazole 40 MG tablet Commonly known as: PROTONIX Take 40 mg by mouth daily.   polyethylene glycol 17 g packet Commonly known as: MIRALAX / GLYCOLAX Take 17 g by mouth daily.   tamsulosin 0.4 MG Caps capsule Commonly known as: FLOMAX Take 0.4 mg by mouth daily.   torsemide 20 MG tablet Commonly known as: DEMADEX Take 20 mg by mouth daily.   VITAMIN B 12 PO Take 1,000 mcg by mouth daily.   Vitamin D3 25 MCG (1000 UT) Caps Take 1,000 Units by mouth 2 (two) times daily.               Discharge Care Instructions  (From admission, onward)           Start     Ordered   02/22/22 0000  Discharge wound care:       Comments: Apply nonstick dressing or foam dressing, change as needed. Wash with warm water and pat dry   02/22/22 1028            Follow-up Information     Janie Morning, DO. Schedule an appointment as soon as possible for a visit.   Specialty: Family Medicine Contact information: [address] [place] [address] [phone]         Heath Lark, MD Follow up.   Specialty: Hematology and Oncology Contact information: [place] [address] [phone]                 Discharge Instructions     Discharge instructions   Complete by: As directed    **IMPORTANT DISCHARGE INSTRUCTIONS**   From Dr. Loleta Books: You were admitted for congestive heart failure. While you were here, you were noted to have anemia, and got an iron infusion and a blood transfusion  We also noticed you have an  infection in your left leg, and so we have prescribed a short course of antibiotics  Take the antibiotic cefadroxil/Duricef 500 mg twice daily for 5 days   Go see Dr. Theda Sers in 1 week Have her check your labs  Until you see her, do NOT take your aspirin or your ramipril  Resume your other home medicines  Go see Dr. Alvy Bimler and discuss with her what to do about your anemia   For the teeth: Follow up with an oral surgeon. You can search on Google or in the Yellow Pages for "oral surgeon" and call around for an appointment You can also call your dentist and ask them for a recommendation for an oral surgeon  Discharge wound care:   Complete by: As directed    Apply nonstick dressing or foam dressing, change as needed. Wash with warm water and pat dry   Increase activity slowly   Complete by: As directed        Discharge Exam: Filed Weights   02/18/22 1604 02/21/22 0428 02/22/22 0500  Weight: 102.1 kg 91.4 kg 92 kg    General: Pt is alert, awake, not in acute distress Cardiovascular: RRR, nl S1-S2, no murmurs appreciated.   Chronic brawny LE edema.   Respiratory: Normal respiratory rate and rhythm.  CTAB without rales or wheezes. Abdominal: Abdomen soft and non-tender.  No distension or HSM.   Neuro/Psych: Strength symmetric in upper and lower extremities.  Judgment and insight appear normal.   Condition at discharge: good  The results of significant diagnostics from this hospitalization (including imaging, microbiology, ancillary and laboratory) are listed below for reference.   Imaging Studies: ECHOCARDIOGRAM COMPLETE  Result Date: 02/20/2022    ECHOCARDIOGRAM REPORT   Patient Name:   Michael [name] Date of Exam: 02/20/2022 Medical Rec #:  [id number]       Height:       67.0 in Accession #:    [id number]      Weight:       225.0 lb Date of Birth:  31-Dec-1930       BSA:          2.126 m Patient Age:    60 years        BP:           123/59 mmHg Patient Gender: M                HR:           72 bpm. Exam Location:  Inpatient Procedure: 2D Echo, Cardiac Doppler and Color Doppler Indications:    CHF-Acute Diastolic B51.02  History:        Patient has prior history of Echocardiogram examinations, most                 recent 04/19/2017. CHF, Signs/Symptoms:Hypotension and Syncope;                 Risk Factors:Hypertension. CKD, stage III.  Sonographer:    Ronny Flurry Referring Phys: [phone] DEBBY CROSLEY IMPRESSIONS  1. Left ventricular ejection fraction, by estimation, is 60 to 65%. The left ventricle has normal function. The left ventricle has no regional wall motion abnormalities. Left ventricular diastolic parameters were normal.  2. Right ventricular systolic function is normal. The right ventricular size is normal.  3. Left atrial size was mildly dilated.  4. The mitral valve is abnormal. Mild mitral valve regurgitation. No evidence of mitral stenosis.  5. The aortic valve is tricuspid. There is moderate calcification of the aortic valve. There is moderate thickening of the aortic valve. Aortic valve regurgitation is not visualized. Aortic valve sclerosis is present, with no evidence of aortic valve stenosis.  6. The inferior vena cava is normal in size with greater than 50% respiratory variability, suggesting right atrial pressure of 3 mmHg. FINDINGS  Left Ventricle: Left ventricular ejection fraction, by estimation, is 60 to 65%. The left ventricle has normal function. The left ventricle has no regional wall motion abnormalities. The left ventricular internal cavity size was normal in size. There is  no left ventricular hypertrophy. Left ventricular diastolic parameters were normal. Right Ventricle: The right ventricular size is normal. No increase in right ventricular wall thickness. Right  ventricular systolic function is normal. Left Atrium: Left atrial size was mildly dilated. Right Atrium: Right atrial size was normal in size. Pericardium: There is no evidence of pericardial  effusion. Mitral Valve: The mitral valve is abnormal. There is mild thickening of the mitral valve leaflet(s). There is mild calcification of the mitral valve leaflet(s). Mild mitral annular calcification. Mild mitral valve regurgitation. No evidence of mitral valve stenosis. Tricuspid Valve: The tricuspid valve is normal in structure. Tricuspid valve regurgitation is mild . No evidence of tricuspid stenosis. Aortic Valve: The aortic valve is tricuspid. There is moderate calcification of the aortic valve. There is moderate thickening of the aortic valve. Aortic valve regurgitation is not visualized. Aortic valve sclerosis is present, with no evidence of aortic valve stenosis. Aortic valve mean gradient measures 4.0 mmHg. Aortic valve peak gradient measures 8.0 mmHg. Aortic valve area, by VTI measures 2.34 cm. Pulmonic Valve: The pulmonic valve was normal in structure. Pulmonic valve regurgitation is not visualized. No evidence of pulmonic stenosis. Aorta: The aortic root is normal in size and structure. Venous: The inferior vena cava is normal in size with greater than 50% respiratory variability, suggesting right atrial pressure of 3 mmHg. IAS/Shunts: No atrial level shunt detected by color flow Doppler.  LEFT VENTRICLE PLAX 2D LVIDd:         4.80 cm   Diastology LVIDs:         2.50 cm   LV e' medial:    5.55 cm/s LV PW:         0.80 cm   LV E/e' medial:  20.5 LV IVS:        0.60 cm   LV e' lateral:   8.92 cm/s LVOT diam:     2.00 cm   LV E/e' lateral: 12.8 LV SV:         70 LV SV Index:   33 LVOT Area:     3.14 cm  RIGHT VENTRICLE            IVC RV S prime:     9.57 cm/s  IVC diam: 2.20 cm TAPSE (M-mode): 2.4 cm LEFT ATRIUM             Index        RIGHT ATRIUM           Index LA diam:        4.10 cm 1.93 cm/m   RA Area:     12.40 cm LA Vol (A2C):   56.2 ml 26.43 ml/m  RA Volume:   23.20 ml  10.91 ml/m LA Vol (A4C):   60.7 ml 28.55 ml/m LA Biplane Vol: 58.3 ml 27.42 ml/m  AORTIC VALVE AV Area (Vmax):     2.25 cm AV Area (Vmean):   1.99 cm AV Area (VTI):     2.34 cm AV Vmax:           141.00 cm/s AV Vmean:          94.400 cm/s AV VTI:            0.298 m AV Peak Grad:      8.0 mmHg AV Mean Grad:      4.0 mmHg LVOT Vmax:         100.80 cm/s LVOT Vmean:        59.850 cm/s LVOT VTI:          0.222 m LVOT/AV VTI ratio: 0.74  AORTA Ao Root diam: 3.60 cm Ao Asc diam:  3.20 cm MITRAL VALVE                TRICUSPID VALVE MV Area (PHT): 3.91 cm     TR Peak grad:   24.8 mmHg MV Decel Time: 194 msec     TR Vmax:        249.00 cm/s MV E velocity: 114.00 cm/s MV A velocity: 68.10 cm/s   SHUNTS MV E/A ratio:  1.67         Systemic VTI:  0.22 m                             Systemic Diam: 2.00 cm Jenkins Rouge MD Electronically signed by Jenkins Rouge MD Signature Date/Time: 02/20/2022/9:57:13 AM    Final    CT Head Wo Contrast  Result Date: 02/18/2022 CLINICAL DATA:  Syncope, landed on face. EXAM: CT HEAD WITHOUT CONTRAST CT MAXILLOFACIAL WITHOUT CONTRAST CT CERVICAL SPINE WITHOUT CONTRAST TECHNIQUE: Multidetector CT imaging of the head, cervical spine, and maxillofacial structures were performed using the standard protocol without intravenous contrast. Multiplanar CT image reconstructions of the cervical spine and maxillofacial structures were also generated. RADIATION DOSE REDUCTION: This exam was performed according to the departmental dose-optimization program which includes automated exposure control, adjustment of the mA and/or kV according to patient size and/or use of iterative reconstruction technique. COMPARISON:  CTA head/neck 10/20/2016 FINDINGS: CT HEAD FINDINGS Brain: There is no acute intracranial hemorrhage, extra-axial fluid collection, or acute infarct Parenchymal volume is within normal limits for age. The ventricles are normal in size. Gray-white differentiation is preserved. The pituitary and suprasellar region are normal. There is no mass lesion. There is no mass effect or midline shift. Vascular: No  hyperdense vessel or unexpected calcification. Skull: Normal. Negative for fracture or focal lesion. Other: None. CT MAXILLOFACIAL FINDINGS Osseous: There is an acute fracture of the maxilla anteriorly with anterior displacement of the roots of the right lateral incisor and right canine (10-38, 10-35), fracture of the left central incisor, and suspected traumatic extraction of the right central incisor. No other acute facial bone fracture is seen. There is no evidence of mandibular dislocation. There is no suspicious osseous lesion. Orbits: The globes are intact with bilateral lens implants in place. There is no retrobulbar hematoma. Sinuses: There is mild mucosal thickening in the paranasal sinuses. Soft tissues: Unremarkable Other: There is extensive dental disease primarily involving the maxillary teeth. CT CERVICAL SPINE FINDINGS Alignment: There is no significant antero or retrolisthesis. There is no jumped or perched facet or other evidence of traumatic malalignment. Skull base and vertebrae: Skull base alignment is maintained. Vertebral body heights are preserved. There is diffuse sclerosis in the imaged vertebral bodies, similar to 2018 and nonspecific. There is no focal suspicious osseous lesion. Soft tissues and spinal canal: No prevertebral fluid or swelling. No visible canal hematoma. Disc levels: There is multilevel disc space narrowing and degenerative endplate change, most advanced at C3-C4, C5-C6, and C6-C7. There is no evidence of high-grade spinal canal stenosis. Upper chest: The imaged lung apices are clear. Other: None. IMPRESSION: 1. No acute intracranial pathology. 2. Acute fracture of the maxilla anteriorly with anterior displacement of the roots of the right lateral incisor and right canine, fracture of the left central incisor, and suspected traumatic extraction of the right central incisor. 3. No acute fracture or traumatic malalignment of the cervical spine. Electronically Signed   By:  [name] M.D.   On: 02/18/2022  16:54   CT Maxillofacial Wo Contrast  Result Date: 02/18/2022 CLINICAL DATA:  Syncope, landed on face. EXAM: CT HEAD WITHOUT CONTRAST CT MAXILLOFACIAL WITHOUT CONTRAST CT CERVICAL SPINE WITHOUT CONTRAST TECHNIQUE: Multidetector CT imaging of the head, cervical spine, and maxillofacial structures were performed using the standard protocol without intravenous contrast. Multiplanar CT image reconstructions of the cervical spine and maxillofacial structures were also generated. RADIATION DOSE REDUCTION: This exam was performed according to the departmental dose-optimization program which includes automated exposure control, adjustment of the mA and/or kV according to patient size and/or use of iterative reconstruction technique. COMPARISON:  CTA head/neck 10/20/2016 FINDINGS: CT HEAD FINDINGS Brain: There is no acute intracranial hemorrhage, extra-axial fluid collection, or acute infarct Parenchymal volume is within normal limits for age. The ventricles are normal in size. Gray-white differentiation is preserved. The pituitary and suprasellar region are normal. There is no mass lesion. There is no mass effect or midline shift. Vascular: No hyperdense vessel or unexpected calcification. Skull: Normal. Negative for fracture or focal lesion. Other: None. CT MAXILLOFACIAL FINDINGS Osseous: There is an acute fracture of the maxilla anteriorly with anterior displacement of the roots of the right lateral incisor and right canine (10-38, 10-35), fracture of the left central incisor, and suspected traumatic extraction of the right central incisor. No other acute facial bone fracture is seen. There is no evidence of mandibular dislocation. There is no suspicious osseous lesion. Orbits: The globes are intact with bilateral lens implants in place. There is no retrobulbar hematoma. Sinuses: There is mild mucosal thickening in the paranasal sinuses. Soft tissues: Unremarkable Other: There is  extensive dental disease primarily involving the maxillary teeth. CT CERVICAL SPINE FINDINGS Alignment: There is no significant antero or retrolisthesis. There is no jumped or perched facet or other evidence of traumatic malalignment. Skull base and vertebrae: Skull base alignment is maintained. Vertebral body heights are preserved. There is diffuse sclerosis in the imaged vertebral bodies, similar to 2018 and nonspecific. There is no focal suspicious osseous lesion. Soft tissues and spinal canal: No prevertebral fluid or swelling. No visible canal hematoma. Disc levels: There is multilevel disc space narrowing and degenerative endplate change, most advanced at C3-C4, C5-C6, and C6-C7. There is no evidence of high-grade spinal canal stenosis. Upper chest: The imaged lung apices are clear. Other: None. IMPRESSION: 1. No acute intracranial pathology. 2. Acute fracture of the maxilla anteriorly with anterior displacement of the roots of the right lateral incisor and right canine, fracture of the left central incisor, and suspected traumatic extraction of the right central incisor. 3. No acute fracture or traumatic malalignment of the cervical spine. Electronically Signed   By: [name] M.D.   On: 02/18/2022 16:54   CT Cervical Spine Wo Contrast  Result Date: 02/18/2022 CLINICAL DATA:  Syncope, landed on face. EXAM: CT HEAD WITHOUT CONTRAST CT MAXILLOFACIAL WITHOUT CONTRAST CT CERVICAL SPINE WITHOUT CONTRAST TECHNIQUE: Multidetector CT imaging of the head, cervical spine, and maxillofacial structures were performed using the standard protocol without intravenous contrast. Multiplanar CT image reconstructions of the cervical spine and maxillofacial structures were also generated. RADIATION DOSE REDUCTION: This exam was performed according to the departmental dose-optimization program which includes automated exposure control, adjustment of the mA and/or kV according to patient size and/or use of iterative  reconstruction technique. COMPARISON:  CTA head/neck 10/20/2016 FINDINGS: CT HEAD FINDINGS Brain: There is no acute intracranial hemorrhage, extra-axial fluid collection, or acute infarct Parenchymal volume is within normal limits for age. The ventricles are normal in  size. Gray-white differentiation is preserved. The pituitary and suprasellar region are normal. There is no mass lesion. There is no mass effect or midline shift. Vascular: No hyperdense vessel or unexpected calcification. Skull: Normal. Negative for fracture or focal lesion. Other: None. CT MAXILLOFACIAL FINDINGS Osseous: There is an acute fracture of the maxilla anteriorly with anterior displacement of the roots of the right lateral incisor and right canine (10-38, 10-35), fracture of the left central incisor, and suspected traumatic extraction of the right central incisor. No other acute facial bone fracture is seen. There is no evidence of mandibular dislocation. There is no suspicious osseous lesion. Orbits: The globes are intact with bilateral lens implants in place. There is no retrobulbar hematoma. Sinuses: There is mild mucosal thickening in the paranasal sinuses. Soft tissues: Unremarkable Other: There is extensive dental disease primarily involving the maxillary teeth. CT CERVICAL SPINE FINDINGS Alignment: There is no significant antero or retrolisthesis. There is no jumped or perched facet or other evidence of traumatic malalignment. Skull base and vertebrae: Skull base alignment is maintained. Vertebral body heights are preserved. There is diffuse sclerosis in the imaged vertebral bodies, similar to 2018 and nonspecific. There is no focal suspicious osseous lesion. Soft tissues and spinal canal: No prevertebral fluid or swelling. No visible canal hematoma. Disc levels: There is multilevel disc space narrowing and degenerative endplate change, most advanced at C3-C4, C5-C6, and C6-C7. There is no evidence of high-grade spinal canal  stenosis. Upper chest: The imaged lung apices are clear. Other: None. IMPRESSION: 1. No acute intracranial pathology. 2. Acute fracture of the maxilla anteriorly with anterior displacement of the roots of the right lateral incisor and right canine, fracture of the left central incisor, and suspected traumatic extraction of the right central incisor. 3. No acute fracture or traumatic malalignment of the cervical spine. Electronically Signed   By: [name] M.D.   On: 02/18/2022 16:54   DG Pelvis 1-2 Views  Result Date: 02/18/2022 CLINICAL DATA:  Chest pain syncopal episode EXAM: PELVIS - 1-2 VIEW COMPARISON:  12/21/2017 FINDINGS: SI joint degenerative changes. Pubic symphysis and rami appear intact. No fracture or malalignment IMPRESSION: No acute osseous abnormality Electronically Signed   By: Donavan Foil M.D.   On: 02/18/2022 16:22   DG Chest 1 View  Result Date: 02/18/2022 CLINICAL DATA:  Chest pain EXAM: CHEST  1 VIEW COMPARISON:  11/22/2017 FINDINGS: Suspect trace right pleural effusion. No consolidation or effusion. Normal cardiac size. No pneumothorax. High-riding right humeral head right greater than left humeral heads consistent with rotator cuff disease. Degenerative changes at the right greater than left shoulders. IMPRESSION: Suspect trace right pleural effusion. Electronically Signed   By: Donavan Foil M.D.   On: 02/18/2022 16:21    Microbiology: Results for orders placed or performed in visit on 09/17/18  Novel Coronavirus, NAA ([facility])     Status: None   Collection Time: 09/17/18 12:00 AM   Specimen: Nasopharyngeal(NP) swabs in vial transport medium   NASOPHARYNGE  SCREENIN  Result Value Ref Range Status   SARS-CoV-2, NAA Not Detected Not Detected Final    Comment: This test was developed and its performance characteristics determined by [employer]. This test has not been FDA cleared or approved. This test has been authorized by FDA under an Emergency  Use Authorization (EUA). This test is only authorized for the duration of time the declaration that circumstances exist justifying the authorization of the emergency use of in vitro diagnostic tests for detection of SARS-CoV-2 virus  and/or diagnosis of COVID-19 infection under section 564(b)(1) of the Act, 21 U.S.C. 470JGG-8(Z)(6), unless the authorization is terminated or revoked sooner. When diagnostic testing is negative, the possibility of a false negative result should be considered in the context of a patient's recent exposures and the presence of clinical signs and symptoms consistent with COVID-19. An individual without symptoms of COVID-19 and who is not shedding SARS-CoV-2 virus would expect to have a negative (not detected) result in this assay.     Labs: CBC: Recent Labs  Lab 02/18/22 2235 02/20/22 0650 02/21/22 0559 02/22/22 0519  WBC 6.5 5.8 6.5 7.3  HGB 8.6* 8.1* 7.6* 8.4*  HCT 29.0* 26.4* 24.4* 27.1*  MCV 87.9 85.7 85.9 87.1  PLT 136* 123* 112* 629*   Basic Metabolic Panel: Recent Labs  Lab 02/18/22 2235 02/19/22 0620 02/20/22 0732 02/21/22 0559 02/22/22 0519  NA 138 140 139 139 139  K 5.0 4.8 4.3 4.0 4.5  CL 104 107 106 106 106  CO2 '23 25 26 26 26  '$ GLUCOSE 161* 137* 162* 100* 116*  BUN 27* 27* '22 19 17  '$ CREATININE 1.27* 1.12 1.10 1.02 1.11  CALCIUM 8.3* 8.6* 8.9 8.6* 8.2*  MG  --  2.0  --   --   --    Liver Function Tests: No results for input(s): "AST", "ALT", "ALKPHOS", "BILITOT", "PROT", "ALBUMIN" in the last 168 hours. CBG: Recent Labs  Lab 02/21/22 0811 02/21/22 1152 02/21/22 1721 02/21/22 2127 02/22/22 0754  GLUCAP 113* 181* 191* 150* 119*    Discharge time spent: approximately 35 minutes spent on discharge counseling, evaluation of patient on day of discharge, and coordination of discharge planning with nursing, social work, pharmacy and case management  Signed: [name], MD [facility] 02/22/2022

## 2022-02-23 LAB — TYPE AND SCREEN
ABO/RH(D): O POS
Antibody Screen: NEGATIVE
Unit division: 0

## 2022-02-23 LAB — BPAM RBC
Blood Product Expiration Date: 202403032359
ISSUE DATE / TIME: 202401271232
Unit Type and Rh: 5100

## 2022-02-25 ENCOUNTER — Other Ambulatory Visit: Payer: Self-pay | Admitting: Hematology and Oncology

## 2022-02-25 DIAGNOSIS — D500 Iron deficiency anemia secondary to blood loss (chronic): Secondary | ICD-10-CM

## 2022-03-02 DIAGNOSIS — I890 Lymphedema, not elsewhere classified: Secondary | ICD-10-CM | POA: Diagnosis not present

## 2022-03-02 DIAGNOSIS — D649 Anemia, unspecified: Secondary | ICD-10-CM | POA: Diagnosis not present

## 2022-03-02 DIAGNOSIS — L0390 Cellulitis, unspecified: Secondary | ICD-10-CM | POA: Diagnosis not present

## 2022-03-02 DIAGNOSIS — Z79899 Other long term (current) drug therapy: Secondary | ICD-10-CM | POA: Diagnosis not present

## 2022-03-02 DIAGNOSIS — Z09 Encounter for follow-up examination after completed treatment for conditions other than malignant neoplasm: Secondary | ICD-10-CM | POA: Diagnosis not present

## 2022-03-05 ENCOUNTER — Encounter (INDEPENDENT_AMBULATORY_CARE_PROVIDER_SITE_OTHER): Payer: Medicare HMO | Admitting: Ophthalmology

## 2022-03-05 DIAGNOSIS — E113553 Type 2 diabetes mellitus with stable proliferative diabetic retinopathy, bilateral: Secondary | ICD-10-CM | POA: Diagnosis not present

## 2022-03-05 DIAGNOSIS — H3561 Retinal hemorrhage, right eye: Secondary | ICD-10-CM | POA: Diagnosis not present

## 2022-03-16 ENCOUNTER — Inpatient Hospital Stay (HOSPITAL_BASED_OUTPATIENT_CLINIC_OR_DEPARTMENT_OTHER): Payer: Medicare HMO | Admitting: Hematology and Oncology

## 2022-03-16 ENCOUNTER — Other Ambulatory Visit: Payer: Self-pay

## 2022-03-16 ENCOUNTER — Other Ambulatory Visit: Payer: Self-pay | Admitting: Hematology and Oncology

## 2022-03-16 ENCOUNTER — Inpatient Hospital Stay: Payer: Medicare HMO | Attending: Hematology and Oncology

## 2022-03-16 ENCOUNTER — Encounter: Payer: Self-pay | Admitting: Hematology and Oncology

## 2022-03-16 ENCOUNTER — Inpatient Hospital Stay: Payer: Medicare HMO

## 2022-03-16 VITALS — BP 149/52 | HR 67 | Temp 97.5°F | Resp 19

## 2022-03-16 VITALS — BP 137/44 | HR 57 | Temp 97.9°F | Resp 18 | Ht 67.0 in | Wt 196.8 lb

## 2022-03-16 DIAGNOSIS — C9110 Chronic lymphocytic leukemia of B-cell type not having achieved remission: Secondary | ICD-10-CM | POA: Diagnosis not present

## 2022-03-16 DIAGNOSIS — D508 Other iron deficiency anemias: Secondary | ICD-10-CM | POA: Insufficient documentation

## 2022-03-16 DIAGNOSIS — D630 Anemia in neoplastic disease: Secondary | ICD-10-CM

## 2022-03-16 DIAGNOSIS — D500 Iron deficiency anemia secondary to blood loss (chronic): Secondary | ICD-10-CM

## 2022-03-16 LAB — COMPREHENSIVE METABOLIC PANEL
ALT: 5 U/L (ref 0–44)
AST: 13 U/L — ABNORMAL LOW (ref 15–41)
Albumin: 3.9 g/dL (ref 3.5–5.0)
Alkaline Phosphatase: 73 U/L (ref 38–126)
Anion gap: 6 (ref 5–15)
BUN: 33 mg/dL — ABNORMAL HIGH (ref 8–23)
CO2: 32 mmol/L (ref 22–32)
Calcium: 8.5 mg/dL — ABNORMAL LOW (ref 8.9–10.3)
Chloride: 98 mmol/L (ref 98–111)
Creatinine, Ser: 1.36 mg/dL — ABNORMAL HIGH (ref 0.61–1.24)
GFR, Estimated: 50 mL/min — ABNORMAL LOW (ref >60)
Glucose, Bld: 239 mg/dL — ABNORMAL HIGH (ref 70–99)
Potassium: 4.4 mmol/L (ref 3.5–5.1)
Sodium: 136 mmol/L (ref 135–145)
Total Bilirubin: 0.4 mg/dL (ref 0.3–1.2)
Total Protein: 6.7 g/dL (ref 6.5–8.1)

## 2022-03-16 LAB — CBC WITH DIFFERENTIAL/PLATELET
Abs Immature Granulocytes: 0 10*3/uL (ref 0.00–0.07)
Band Neutrophils: 1 %
Basophils Absolute: 0.2 10*3/uL — ABNORMAL HIGH (ref 0.0–0.1)
Basophils Relative: 2 %
Eosinophils Absolute: 0.2 10*3/uL (ref 0.0–0.5)
Eosinophils Relative: 2 %
HCT: 32.9 % — ABNORMAL LOW (ref 39.0–52.0)
Hemoglobin: 10.8 g/dL — ABNORMAL LOW (ref 13.0–17.0)
Lymphocytes Relative: 40 %
Lymphs Abs: 3.2 10*3/uL (ref 0.7–4.0)
MCH: 28 pg (ref 26.0–34.0)
MCHC: 32.8 g/dL (ref 30.0–36.0)
MCV: 85.2 fL (ref 80.0–100.0)
Monocytes Absolute: 0.6 10*3/uL (ref 0.1–1.0)
Monocytes Relative: 7 %
Neutro Abs: 3.9 10*3/uL (ref 1.7–7.7)
Neutrophils Relative %: 48 %
Platelets: 102 10*3/uL — ABNORMAL LOW (ref 150–400)
RBC: 3.86 MIL/uL — ABNORMAL LOW (ref 4.22–5.81)
RDW: 16.8 % — ABNORMAL HIGH (ref 11.5–15.5)
WBC: 8 10*3/uL (ref 4.0–10.5)
nRBC: 0 % (ref 0.0–0.2)

## 2022-03-16 LAB — SAMPLE TO BLOOD BANK

## 2022-03-16 LAB — IRON AND IRON BINDING CAPACITY (CC-WL,HP ONLY)
Iron: 67 ug/dL (ref 45–182)
Saturation Ratios: 18 % (ref 17.9–39.5)
TIBC: 368 ug/dL (ref 250–450)
UIBC: 301 ug/dL (ref 117–376)

## 2022-03-16 LAB — FERRITIN: Ferritin: 43 ng/mL (ref 24–336)

## 2022-03-16 MED ORDER — SODIUM CHLORIDE 0.9 % IV SOLN: Freq: Once | INTRAVENOUS | Status: AC

## 2022-03-16 MED ORDER — SODIUM CHLORIDE 0.9 % IV SOLN
400.0000 mg | Freq: Once | INTRAVENOUS | Status: AC
  Administered 2022-03-16: 400 mg via INTRAVENOUS
  Filled 2022-03-16: qty 20

## 2022-03-16 NOTE — Assessment & Plan Note (Signed)
He was found to have severe iron deficiency anemia during recent hospitalization and had received 1 dose of intravenous iron sucrose I recommend another dose of iron sucrose today The most likely cause of his anemia is due to chronic blood loss/malabsorption syndrome. We discussed some of the risks, benefits, and alternatives of intravenous iron infusions. The patient is symptomatic from anemia and the iron level is critically low. He tolerated oral iron supplement poorly and desires to achieved higher levels of iron faster for adequate hematopoesis. Some of the side-effects to be expected including risks of infusion reactions, phlebitis, headaches, nausea and fatigue.  The patient is willing to proceed. Patient education material was dispensed.  Goal is to keep ferritin level greater than 50 and resolution of anemia

## 2022-03-16 NOTE — Progress Notes (Signed)
Pt declined to be observed for 10mns post venofer infusion. VSS at discharge, pt ambulated to lobby w/o complaints.

## 2022-03-16 NOTE — Assessment & Plan Note (Signed)
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

## 2022-03-16 NOTE — Progress Notes (Signed)
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 We discussed some of the risks, benefits, and alternatives of intravenous iron infusions. The patient is symptomatic from anemia and the iron level is critically low. He tolerated oral iron supplement poorly and desires to achieved higher levels of iron faster for adequate hematopoesis. Some of the side-effects to be expected including risks of infusion reactions, phlebitis, headaches, nausea and fatigue.  The patient is willing to proceed. Patient education material was dispensed.  Goal is to keep ferritin level greater than 50 and resolution of anemia   No orders of the defined types were placed in this encounter.   All questions were answered. The patient knows to call the clinic with any problems, questions or concerns. The total time spent in the appointment was 20 minutes encounter with patients including review of chart and various tests results, discussions about plan of care and coordination of care plan   Heath Lark, MD 03/16/2022 11:41 AM  INTERVAL HISTORY: Please see below for problem oriented charting. he returns for treatment follow-up after recent discharge from the hospital He was admitted due to lower extremity swelling and cellulitis Since discharge from the  hospital, he is much better His weight is down His energy level is good He denies recent bleeding  REVIEW OF SYSTEMS:   Constitutional: Denies fevers, chills or abnormal weight loss Eyes: Denies blurriness of vision Ears, nose, mouth, throat, and face: Denies mucositis or sore throat Respiratory: Denies cough, dyspnea or wheezes Cardiovascular: Denies palpitation, chest discomfort  Gastrointestinal:  Denies nausea, heartburn or change in bowel habits Skin: Denies abnormal skin rashes Lymphatics: Denies new lymphadenopathy or easy bruising Neurological:Denies numbness, tingling or new weaknesses Behavioral/Psych: Mood is stable, no new changes  All other systems were reviewed with the patient and are negative.  I have reviewed the past medical history, past surgical history, social history and family history with the patient and they are unchanged from previous note.  ALLERGIES:  has No Known Allergies.  MEDICATIONS:  Current Outpatient Medications  Medication Sig Dispense Refill   acetaminophen (TYLENOL) 650 MG CR tablet Take 1 tablet (650 mg total) by mouth 2 (two) times daily as needed for pain. (Patient taking differently: Take 650 mg by mouth 2 (two) times daily.) 30 tablet 0   Cholecalciferol (VITAMIN D3) 1000 units CAPS Take 1,000 Units by mouth 2 (two) times daily.      Cyanocobalamin (VITAMIN B 12 PO) Take 1,000 mcg by mouth daily.     insulin NPH-regular Human (70-30) 100 UNIT/ML injection Inject 35 Units into the skin 2 (two) times daily.     mirtazapine (REMERON) 15 MG tablet Take 1 tablet (15 mg total) by mouth at bedtime.     morphine (MSIR) 15 MG tablet Take 15 mg by mouth 2 (two) times daily.     pantoprazole (PROTONIX) 40 MG tablet  Take 40 mg by mouth daily.     polyethylene glycol (MIRALAX / GLYCOLAX) packet Take 17 g by mouth daily. 14 each 0   tamsulosin (FLOMAX) 0.4 MG CAPS capsule Take 0.4 mg by mouth daily.     torsemide (DEMADEX) 20 MG tablet Take 20 mg by  mouth daily.     No current facility-administered medications for this visit.    SUMMARY OF ONCOLOGIC HISTORY: Oncology History  CLL (chronic lymphocytic leukemia) ([place])  04/03/2008 Pathology Results   Case #: [id number] FLow cytomtry confirmed CLL   10/11/2014 Tumor Marker   FISH analysis showed trisomy 12 abnormality   04/29/2015 - 12/01/2021 Chemotherapy   He started taking Ibrutinib   04/29/2015 Imaging   Ct scan showed mild lymphadenopathy and splenomegaly   05/13/2015 Adverse Reaction   Due to neutropenia, dose of Ibrutinib is reduced to 280 mg daily   01/08/2016 Imaging   Response to therapy. Resolution of thoracic and abdominal adenopathy. Resolution of splenomegaly. 2. No new or progressive disease. 3. Small bowel containing left inguinal hernia and right-sided hydrocele, as before. 4. Esophageal air fluid level suggests dysmotility or gastroesophageal reflux. 5.  Possible constipation. 6. Suspect hepatic hemangiomas, similar.   10/09/2016 Imaging   No findings suspicious for active lymphomatous involvement.  No abdominopelvic lymphadenopathy.  Spleen is normal in size.  Moderate to large left inguinal/scrotal hernia containing small bowel and fat. No evidence of bowel obstruction.  Additional stable ancillary findings as above.   04/26/2017 Imaging   1. No lymphadenopathy in the chest, abdomen, or pelvis. 2. Mild circumferential bladder wall thickening. Incomplete distention may contribute to this appearance, but bladder infection is also consideration. 3. Stable appearance small hepatic lesions. Imaging features most suggestive of benign cavernous hemangiomas. 4. Stable tiny low-density renal lesions compatible with cysts. 5. Large left groin hernia contains numerous small bowel loops without complicating features. 6. Right-sided hydrocele.   11/15/2017 Imaging   Large recurrent left inguinal hernia with extension of multiple small bowel loops into the inguinal canal and  scrotum on the left. Proximal small bowel dilatation is noted consistent with a degree of incarceration.   Bilateral small effusions as well as patchy bibasilar infiltrates.     12/14/2017 Adverse Reaction   Ibrutinib placed on hold   06/17/2020 Cancer Staging   Staging form: Chronic Lymphocytic Leukemia / Small Lymphocytic Lymphoma, AJCC 8th Edition - Clinical stage from 06/17/2020: Modified Rai Stage 0 (Modified Rai risk: Low, Lymphocytosis: Present, Adenopathy: Absent, Organomegaly: Absent, Anemia: Absent, Thrombocytopenia: Absent) - Signed by Heath Lark, MD on 06/17/2020 Stage prefix: Initial diagnosis   11/27/2021 Imaging   Chest Impression:   1. No lymphadenopathy. 2. No pulmonary parenchymal findings   Abdomen / Pelvis Impression:   1. No evidence of lymphoma/lymphadenopathy in the abdomen pelvis. 2. Normal spleen. 3. Short segment of small bowel enters a RIGHT inguinal hernia. 4. RIGHT hydrocele .     PHYSICAL EXAMINATION: ECOG PERFORMANCE STATUS: 1 - Symptomatic but completely ambulatory  Vitals:   03/16/22 1128  BP: (!) 137/44  Pulse: (!) 57  Resp: 18  Temp: 97.9 F (36.6 C)  SpO2: 100%   Filed Weights   03/16/22 1128  Weight: 196 lb 12.8 oz (89.3 kg)    GENERAL:alert, no distress and comfortable  NEURO: alert & oriented x 3 with fluent speech, no focal motor/sensory deficits  LABORATORY DATA:  I have reviewed the data as listed    Component Value Date/Time   NA 139 02/22/2022 0519  NA 140 10/09/2016 1106   K 4.5 02/22/2022 0519   K 4.7 10/09/2016 1106   CL 106 02/22/2022 0519   CL 104 03/28/2012 1426   CO2 26 02/22/2022 0519   CO2 28 10/09/2016 1106   GLUCOSE 116 (H) 02/22/2022 0519   GLUCOSE 127 10/09/2016 1106   GLUCOSE 171 (H) 03/28/2012 1426   BUN 17 02/22/2022 0519   BUN 20.8 10/09/2016 1106   CREATININE 1.11 02/22/2022 0519   CREATININE 1.15 09/06/2018 1016   CREATININE 1.1 10/09/2016 1106   CALCIUM 8.2 (L) 02/22/2022 0519   CALCIUM  9.1 10/09/2016 1106   PROT 6.3 (L) 11/18/2021 1009   PROT 5.8 (L) 10/09/2016 1106   ALBUMIN 3.5 11/18/2021 1009   ALBUMIN 3.1 (L) 10/09/2016 1106   AST 14 (L) 11/18/2021 1009   AST 18 09/06/2018 1016   AST 17 10/09/2016 1106   ALT 10 11/18/2021 1009   ALT 12 09/06/2018 1016   ALT 8 10/09/2016 1106   ALKPHOS 45 11/18/2021 1009   ALKPHOS 46 10/09/2016 1106   BILITOT 0.4 11/18/2021 1009   BILITOT 0.3 09/06/2018 1016   BILITOT 0.23 10/09/2016 1106   GFRNONAA >60 02/22/2022 0519   GFRNONAA 57 (L) 09/06/2018 1016   GFRAA 59 (L) 09/12/2019 1034   GFRAA >60 09/06/2018 1016    No results found for: "SPEP", "UPEP"  Lab Results  Component Value Date   WBC 8.0 03/16/2022   NEUTROABS PENDING 03/16/2022   HGB 10.8 (L) 03/16/2022   HCT 32.9 (L) 03/16/2022   MCV 85.2 03/16/2022   PLT 102 (L) 03/16/2022      Chemistry      Component Value Date/Time   NA 139 02/22/2022 0519   NA 140 10/09/2016 1106   K 4.5 02/22/2022 0519   K 4.7 10/09/2016 1106   CL 106 02/22/2022 0519   CL 104 03/28/2012 1426   CO2 26 02/22/2022 0519   CO2 28 10/09/2016 1106   BUN 17 02/22/2022 0519   BUN 20.8 10/09/2016 1106   CREATININE 1.11 02/22/2022 0519   CREATININE 1.15 09/06/2018 1016   CREATININE 1.1 10/09/2016 1106      Component Value Date/Time   CALCIUM 8.2 (L) 02/22/2022 0519   CALCIUM 9.1 10/09/2016 1106   ALKPHOS 45 11/18/2021 1009   ALKPHOS 46 10/09/2016 1106   AST 14 (L) 11/18/2021 1009   AST 18 09/06/2018 1016   AST 17 10/09/2016 1106   ALT 10 11/18/2021 1009   ALT 12 09/06/2018 1016   ALT 8 10/09/2016 1106   BILITOT 0.4 11/18/2021 1009   BILITOT 0.3 09/06/2018 1016   BILITOT 0.23 10/09/2016 1106       RADIOGRAPHIC STUDIES: I have personally reviewed the radiological images as listed and agreed with the findings in the report. ECHOCARDIOGRAM COMPLETE  Result Date: 02/20/2022    ECHOCARDIOGRAM REPORT   Patient Name:   NAHYAN ALTHOUSE Date of Exam: 02/20/2022 Medical Rec #:   [id number]       Height:       67.0 in Accession #:    [id number]      Weight:       225.0 lb Date of Birth:  Jun 16, 1930       BSA:          2.126 m Patient Age:    63 years        BP:           123/59 mmHg Patient Gender: M  HR:           72 bpm. Exam Location:  Inpatient Procedure: 2D Echo, Cardiac Doppler and Color Doppler Indications:    CHF-Acute Diastolic [id number]  History:        Patient has prior history of Echocardiogram examinations, most                 recent 04/19/2017. CHF, Signs/Symptoms:Hypotension and Syncope;                 Risk Factors:Hypertension. CKD, stage III.  Sonographer:    Ronny Flurry Referring Phys: [phone] DEBBY CROSLEY IMPRESSIONS  1. Left ventricular ejection fraction, by estimation, is 60 to 65%. The left ventricle has normal function. The left ventricle has no regional wall motion abnormalities. Left ventricular diastolic parameters were normal.  2. Right ventricular systolic function is normal. The right ventricular size is normal.  3. Left atrial size was mildly dilated.  4. The mitral valve is abnormal. Mild mitral valve regurgitation. No evidence of mitral stenosis.  5. The aortic valve is tricuspid. There is moderate calcification of the aortic valve. There is moderate thickening of the aortic valve. Aortic valve regurgitation is not visualized. Aortic valve sclerosis is present, with no evidence of aortic valve stenosis.  6. The inferior vena cava is normal in size with greater than 50% respiratory variability, suggesting right atrial pressure of 3 mmHg. FINDINGS  Left Ventricle: Left ventricular ejection fraction, by estimation, is 60 to 65%. The left ventricle has normal function. The left ventricle has no regional wall motion abnormalities. The left ventricular internal cavity size was normal in size. There is  no left ventricular hypertrophy. Left ventricular diastolic parameters were normal. Right Ventricle: The right ventricular size is normal. No increase in  right ventricular wall thickness. Right ventricular systolic function is normal. Left Atrium: Left atrial size was mildly dilated. Right Atrium: Right atrial size was normal in size. Pericardium: There is no evidence of pericardial effusion. Mitral Valve: The mitral valve is abnormal. There is mild thickening of the mitral valve leaflet(s). There is mild calcification of the mitral valve leaflet(s). Mild mitral annular calcification. Mild mitral valve regurgitation. No evidence of mitral valve stenosis. Tricuspid Valve: The tricuspid valve is normal in structure. Tricuspid valve regurgitation is mild . No evidence of tricuspid stenosis. Aortic Valve: The aortic valve is tricuspid. There is moderate calcification of the aortic valve. There is moderate thickening of the aortic valve. Aortic valve regurgitation is not visualized. Aortic valve sclerosis is present, with no evidence of aortic valve stenosis. Aortic valve mean gradient measures 4.0 mmHg. Aortic valve peak gradient measures 8.0 mmHg. Aortic valve area, by VTI measures 2.34 cm. Pulmonic Valve: The pulmonic valve was normal in structure. Pulmonic valve regurgitation is not visualized. No evidence of pulmonic stenosis. Aorta: The aortic root is normal in size and structure. Venous: The inferior vena cava is normal in size with greater than 50% respiratory variability, suggesting right atrial pressure of 3 mmHg. IAS/Shunts: No atrial level shunt detected by color flow Doppler.  LEFT VENTRICLE PLAX 2D LVIDd:         4.80 cm   Diastology LVIDs:         2.50 cm   LV e' medial:    5.55 cm/s LV PW:         0.80 cm   LV E/e' medial:  20.5 LV IVS:        0.60 cm   LV e' lateral:  8.92 cm/s LVOT diam:     2.00 cm   LV E/e' lateral: 12.8 LV SV:         70 LV SV Index:   33 LVOT Area:     3.14 cm  RIGHT VENTRICLE            IVC RV S prime:     9.57 cm/s  IVC diam: 2.20 cm TAPSE (M-mode): 2.4 cm LEFT ATRIUM             Index        RIGHT ATRIUM           Index LA  diam:        4.10 cm 1.93 cm/m   RA Area:     12.40 cm LA Vol (A2C):   56.2 ml 26.43 ml/m  RA Volume:   23.20 ml  10.91 ml/m LA Vol (A4C):   60.7 ml 28.55 ml/m LA Biplane Vol: 58.3 ml 27.42 ml/m  AORTIC VALVE AV Area (Vmax):    2.25 cm AV Area (Vmean):   1.99 cm AV Area (VTI):     2.34 cm AV Vmax:           141.00 cm/s AV Vmean:          94.400 cm/s AV VTI:            0.298 m AV Peak Grad:      8.0 mmHg AV Mean Grad:      4.0 mmHg LVOT Vmax:         100.80 cm/s LVOT Vmean:        59.850 cm/s LVOT VTI:          0.222 m LVOT/AV VTI ratio: 0.74  AORTA Ao Root diam: 3.60 cm Ao Asc diam:  3.20 cm MITRAL VALVE                TRICUSPID VALVE MV Area (PHT): 3.91 cm     TR Peak grad:   24.8 mmHg MV Decel Time: 194 msec     TR Vmax:        249.00 cm/s MV E velocity: 114.00 cm/s MV A velocity: 68.10 cm/s   SHUNTS MV E/A ratio:  1.67         Systemic VTI:  0.22 m                             Systemic Diam: 2.00 cm Jenkins Rouge MD Electronically signed by Jenkins Rouge MD Signature Date/Time: 02/20/2022/9:57:13 AM    Final    CT Head Wo Contrast  Result Date: 02/18/2022 CLINICAL DATA:  Syncope, landed on face. EXAM: CT HEAD WITHOUT CONTRAST CT MAXILLOFACIAL WITHOUT CONTRAST CT CERVICAL SPINE WITHOUT CONTRAST TECHNIQUE: Multidetector CT imaging of the head, cervical spine, and maxillofacial structures were performed using the standard protocol without intravenous contrast. Multiplanar CT image reconstructions of the cervical spine and maxillofacial structures were also generated. RADIATION DOSE REDUCTION: This exam was performed according to the departmental dose-optimization program which includes automated exposure control, adjustment of the mA and/or kV according to patient size and/or use of iterative reconstruction technique. COMPARISON:  CTA head/neck 10/20/2016 FINDINGS: CT HEAD FINDINGS Brain: There is no acute intracranial hemorrhage, extra-axial fluid collection, or acute infarct Parenchymal volume is within  normal limits for age. The ventricles are normal in size. Gray-white differentiation is preserved. The pituitary and suprasellar region are normal. There is no mass lesion. There  is no mass effect or midline shift. Vascular: No hyperdense vessel or unexpected calcification. Skull: Normal. Negative for fracture or focal lesion. Other: None. CT MAXILLOFACIAL FINDINGS Osseous: There is an acute fracture of the maxilla anteriorly with anterior displacement of the roots of the right lateral incisor and right canine (10-38, 10-35), fracture of the left central incisor, and suspected traumatic extraction of the right central incisor. No other acute facial bone fracture is seen. There is no evidence of mandibular dislocation. There is no suspicious osseous lesion. Orbits: The globes are intact with bilateral lens implants in place. There is no retrobulbar hematoma. Sinuses: There is mild mucosal thickening in the paranasal sinuses. Soft tissues: Unremarkable Other: There is extensive dental disease primarily involving the maxillary teeth. CT CERVICAL SPINE FINDINGS Alignment: There is no significant antero or retrolisthesis. There is no jumped or perched facet or other evidence of traumatic malalignment. Skull base and vertebrae: Skull base alignment is maintained. Vertebral body heights are preserved. There is diffuse sclerosis in the imaged vertebral bodies, similar to 2018 and nonspecific. There is no focal suspicious osseous lesion. Soft tissues and spinal canal: No prevertebral fluid or swelling. No visible canal hematoma. Disc levels: There is multilevel disc space narrowing and degenerative endplate change, most advanced at C3-C4, C5-C6, and C6-C7. There is no evidence of high-grade spinal canal stenosis. Upper chest: The imaged lung apices are clear. Other: None. IMPRESSION: 1. No acute intracranial pathology. 2. Acute fracture of the maxilla anteriorly with anterior displacement of the roots of the right lateral  incisor and right canine, fracture of the left central incisor, and suspected traumatic extraction of the right central incisor. 3. No acute fracture or traumatic malalignment of the cervical spine. Electronically Signed   By: [name] M.D.   On: 02/18/2022 16:54   CT Maxillofacial Wo Contrast  Result Date: 02/18/2022 CLINICAL DATA:  Syncope, landed on face. EXAM: CT HEAD WITHOUT CONTRAST CT MAXILLOFACIAL WITHOUT CONTRAST CT CERVICAL SPINE WITHOUT CONTRAST TECHNIQUE: Multidetector CT imaging of the head, cervical spine, and maxillofacial structures were performed using the standard protocol without intravenous contrast. Multiplanar CT image reconstructions of the cervical spine and maxillofacial structures were also generated. RADIATION DOSE REDUCTION: This exam was performed according to the departmental dose-optimization program which includes automated exposure control, adjustment of the mA and/or kV according to patient size and/or use of iterative reconstruction technique. COMPARISON:  CTA head/neck 10/20/2016 FINDINGS: CT HEAD FINDINGS Brain: There is no acute intracranial hemorrhage, extra-axial fluid collection, or acute infarct Parenchymal volume is within normal limits for age. The ventricles are normal in size. Gray-white differentiation is preserved. The pituitary and suprasellar region are normal. There is no mass lesion. There is no mass effect or midline shift. Vascular: No hyperdense vessel or unexpected calcification. Skull: Normal. Negative for fracture or focal lesion. Other: None. CT MAXILLOFACIAL FINDINGS Osseous: There is an acute fracture of the maxilla anteriorly with anterior displacement of the roots of the right lateral incisor and right canine (10-38, 10-35), fracture of the left central incisor, and suspected traumatic extraction of the right central incisor. No other acute facial bone fracture is seen. There is no evidence of mandibular dislocation. There is no suspicious  osseous lesion. Orbits: The globes are intact with bilateral lens implants in place. There is no retrobulbar hematoma. Sinuses: There is mild mucosal thickening in the paranasal sinuses. Soft tissues: Unremarkable Other: There is extensive dental disease primarily involving the maxillary teeth. CT CERVICAL SPINE FINDINGS Alignment: There is no  significant antero or retrolisthesis. There is no jumped or perched facet or other evidence of traumatic malalignment. Skull base and vertebrae: Skull base alignment is maintained. Vertebral body heights are preserved. There is diffuse sclerosis in the imaged vertebral bodies, similar to 2018 and nonspecific. There is no focal suspicious osseous lesion. Soft tissues and spinal canal: No prevertebral fluid or swelling. No visible canal hematoma. Disc levels: There is multilevel disc space narrowing and degenerative endplate change, most advanced at C3-C4, C5-C6, and C6-C7. There is no evidence of high-grade spinal canal stenosis. Upper chest: The imaged lung apices are clear. Other: None. IMPRESSION: 1. No acute intracranial pathology. 2. Acute fracture of the maxilla anteriorly with anterior displacement of the roots of the right lateral incisor and right canine, fracture of the left central incisor, and suspected traumatic extraction of the right central incisor. 3. No acute fracture or traumatic malalignment of the cervical spine. Electronically Signed   By: [name] M.D.   On: 02/18/2022 16:54   CT Cervical Spine Wo Contrast  Result Date: 02/18/2022 CLINICAL DATA:  Syncope, landed on face. EXAM: CT HEAD WITHOUT CONTRAST CT MAXILLOFACIAL WITHOUT CONTRAST CT CERVICAL SPINE WITHOUT CONTRAST TECHNIQUE: Multidetector CT imaging of the head, cervical spine, and maxillofacial structures were performed using the standard protocol without intravenous contrast. Multiplanar CT image reconstructions of the cervical spine and maxillofacial structures were also generated.  RADIATION DOSE REDUCTION: This exam was performed according to the departmental dose-optimization program which includes automated exposure control, adjustment of the mA and/or kV according to patient size and/or use of iterative reconstruction technique. COMPARISON:  CTA head/neck 10/20/2016 FINDINGS: CT HEAD FINDINGS Brain: There is no acute intracranial hemorrhage, extra-axial fluid collection, or acute infarct Parenchymal volume is within normal limits for age. The ventricles are normal in size. Gray-white differentiation is preserved. The pituitary and suprasellar region are normal. There is no mass lesion. There is no mass effect or midline shift. Vascular: No hyperdense vessel or unexpected calcification. Skull: Normal. Negative for fracture or focal lesion. Other: None. CT MAXILLOFACIAL FINDINGS Osseous: There is an acute fracture of the maxilla anteriorly with anterior displacement of the roots of the right lateral incisor and right canine (10-38, 10-35), fracture of the left central incisor, and suspected traumatic extraction of the right central incisor. No other acute facial bone fracture is seen. There is no evidence of mandibular dislocation. There is no suspicious osseous lesion. Orbits: The globes are intact with bilateral lens implants in place. There is no retrobulbar hematoma. Sinuses: There is mild mucosal thickening in the paranasal sinuses. Soft tissues: Unremarkable Other: There is extensive dental disease primarily involving the maxillary teeth. CT CERVICAL SPINE FINDINGS Alignment: There is no significant antero or retrolisthesis. There is no jumped or perched facet or other evidence of traumatic malalignment. Skull base and vertebrae: Skull base alignment is maintained. Vertebral body heights are preserved. There is diffuse sclerosis in the imaged vertebral bodies, similar to 2018 and nonspecific. There is no focal suspicious osseous lesion. Soft tissues and spinal canal: No prevertebral  fluid or swelling. No visible canal hematoma. Disc levels: There is multilevel disc space narrowing and degenerative endplate change, most advanced at C3-C4, C5-C6, and C6-C7. There is no evidence of high-grade spinal canal stenosis. Upper chest: The imaged lung apices are clear. Other: None. IMPRESSION: 1. No acute intracranial pathology. 2. Acute fracture of the maxilla anteriorly with anterior displacement of the roots of the right lateral incisor and right canine, fracture of the left central  incisor, and suspected traumatic extraction of the right central incisor. 3. No acute fracture or traumatic malalignment of the cervical spine. Electronically Signed   By: [name] M.D.   On: 02/18/2022 16:54   DG Pelvis 1-2 Views  Result Date: 02/18/2022 CLINICAL DATA:  Chest pain syncopal episode EXAM: PELVIS - 1-2 VIEW COMPARISON:  12/21/2017 FINDINGS: SI joint degenerative changes. Pubic symphysis and rami appear intact. No fracture or malalignment IMPRESSION: No acute osseous abnormality Electronically Signed   By: Donavan Foil M.D.   On: 02/18/2022 16:22   DG Chest 1 View  Result Date: 02/18/2022 CLINICAL DATA:  Chest pain EXAM: CHEST  1 VIEW COMPARISON:  11/22/2017 FINDINGS: Suspect trace right pleural effusion. No consolidation or effusion. Normal cardiac size. No pneumothorax. High-riding right humeral head right greater than left humeral heads consistent with rotator cuff disease. Degenerative changes at the right greater than left shoulders. IMPRESSION: Suspect trace right pleural effusion. Electronically Signed   By: Donavan Foil M.D.   On: 02/18/2022 16:21

## 2022-03-16 NOTE — Patient Instructions (Signed)

## 2022-04-29 DIAGNOSIS — S0242XA Fracture of alveolus of maxilla, initial encounter for closed fracture: Secondary | ICD-10-CM | POA: Diagnosis not present

## 2022-05-18 DIAGNOSIS — Z79899 Other long term (current) drug therapy: Secondary | ICD-10-CM | POA: Diagnosis not present

## 2022-05-18 DIAGNOSIS — M5459 Other low back pain: Secondary | ICD-10-CM | POA: Diagnosis not present

## 2022-05-18 DIAGNOSIS — Z79891 Long term (current) use of opiate analgesic: Secondary | ICD-10-CM | POA: Diagnosis not present

## 2022-05-18 DIAGNOSIS — G894 Chronic pain syndrome: Secondary | ICD-10-CM | POA: Diagnosis not present

## 2022-05-18 DIAGNOSIS — M5416 Radiculopathy, lumbar region: Secondary | ICD-10-CM | POA: Diagnosis not present

## 2022-05-18 DIAGNOSIS — Z5181 Encounter for therapeutic drug level monitoring: Secondary | ICD-10-CM | POA: Diagnosis not present

## 2022-05-26 DIAGNOSIS — N182 Chronic kidney disease, stage 2 (mild): Secondary | ICD-10-CM | POA: Diagnosis not present

## 2022-05-26 DIAGNOSIS — E785 Hyperlipidemia, unspecified: Secondary | ICD-10-CM | POA: Diagnosis not present

## 2022-05-26 DIAGNOSIS — E1122 Type 2 diabetes mellitus with diabetic chronic kidney disease: Secondary | ICD-10-CM | POA: Diagnosis not present

## 2022-05-26 DIAGNOSIS — I129 Hypertensive chronic kidney disease with stage 1 through stage 4 chronic kidney disease, or unspecified chronic kidney disease: Secondary | ICD-10-CM | POA: Diagnosis not present

## 2022-06-02 ENCOUNTER — Inpatient Hospital Stay: Payer: Medicare HMO | Attending: Hematology and Oncology

## 2022-06-02 ENCOUNTER — Other Ambulatory Visit: Payer: Self-pay

## 2022-06-02 ENCOUNTER — Encounter: Payer: Self-pay | Admitting: Hematology and Oncology

## 2022-06-02 ENCOUNTER — Inpatient Hospital Stay (HOSPITAL_BASED_OUTPATIENT_CLINIC_OR_DEPARTMENT_OTHER): Payer: Medicare HMO | Admitting: Hematology and Oncology

## 2022-06-02 VITALS — BP 118/42 | HR 59 | Temp 98.2°F | Resp 18 | Ht 67.0 in | Wt 194.0 lb

## 2022-06-02 DIAGNOSIS — C9110 Chronic lymphocytic leukemia of B-cell type not having achieved remission: Secondary | ICD-10-CM | POA: Diagnosis not present

## 2022-06-02 DIAGNOSIS — N183 Chronic kidney disease, stage 3 unspecified: Secondary | ICD-10-CM | POA: Insufficient documentation

## 2022-06-02 DIAGNOSIS — E119 Type 2 diabetes mellitus without complications: Secondary | ICD-10-CM | POA: Insufficient documentation

## 2022-06-02 DIAGNOSIS — D630 Anemia in neoplastic disease: Secondary | ICD-10-CM

## 2022-06-02 DIAGNOSIS — D508 Other iron deficiency anemias: Secondary | ICD-10-CM | POA: Insufficient documentation

## 2022-06-02 DIAGNOSIS — D500 Iron deficiency anemia secondary to blood loss (chronic): Secondary | ICD-10-CM

## 2022-06-02 LAB — CMP (CANCER CENTER ONLY)
ALT: 8 U/L (ref 0–44)
AST: 16 U/L (ref 15–41)
Albumin: 3.5 g/dL (ref 3.5–5.0)
Alkaline Phosphatase: 57 U/L (ref 38–126)
Anion gap: 6 (ref 5–15)
BUN: 26 mg/dL — ABNORMAL HIGH (ref 8–23)
CO2: 28 mmol/L (ref 22–32)
Calcium: 8.3 mg/dL — ABNORMAL LOW (ref 8.9–10.3)
Chloride: 100 mmol/L (ref 98–111)
Creatinine: 1.3 mg/dL — ABNORMAL HIGH (ref 0.61–1.24)
GFR, Estimated: 53 mL/min — ABNORMAL LOW (ref >60)
Glucose, Bld: 221 mg/dL — ABNORMAL HIGH (ref 70–99)
Potassium: 5.1 mmol/L (ref 3.5–5.1)
Sodium: 134 mmol/L — ABNORMAL LOW (ref 135–145)
Total Bilirubin: 0.5 mg/dL (ref 0.3–1.2)
Total Protein: 6.4 g/dL — ABNORMAL LOW (ref 6.5–8.1)

## 2022-06-02 LAB — CBC WITH DIFFERENTIAL/PLATELET
Abs Immature Granulocytes: 0.02 10*3/uL (ref 0.00–0.07)
Basophils Absolute: 0.1 10*3/uL (ref 0.0–0.1)
Basophils Relative: 1 %
Eosinophils Absolute: 0.1 10*3/uL (ref 0.0–0.5)
Eosinophils Relative: 2 %
HCT: 32.5 % — ABNORMAL LOW (ref 39.0–52.0)
Hemoglobin: 10.9 g/dL — ABNORMAL LOW (ref 13.0–17.0)
Immature Granulocytes: 0 %
Lymphocytes Relative: 50 %
Lymphs Abs: 4.1 10*3/uL — ABNORMAL HIGH (ref 0.7–4.0)
MCH: 29.8 pg (ref 26.0–34.0)
MCHC: 33.5 g/dL (ref 30.0–36.0)
MCV: 88.8 fL (ref 80.0–100.0)
Monocytes Absolute: 1.7 10*3/uL — ABNORMAL HIGH (ref 0.1–1.0)
Monocytes Relative: 21 %
Neutro Abs: 2.1 10*3/uL (ref 1.7–7.7)
Neutrophils Relative %: 26 %
Platelets: 95 10*3/uL — ABNORMAL LOW (ref 150–400)
RBC: 3.66 MIL/uL — ABNORMAL LOW (ref 4.22–5.81)
RDW: 15.9 % — ABNORMAL HIGH (ref 11.5–15.5)
Smear Review: NORMAL
WBC: 8.1 10*3/uL (ref 4.0–10.5)
nRBC: 0 % (ref 0.0–0.2)

## 2022-06-02 LAB — HEMOGLOBIN A1C
Hgb A1c MFr Bld: 9 % — ABNORMAL HIGH (ref 4.8–5.6)
Mean Plasma Glucose: 211.6 mg/dL

## 2022-06-02 LAB — SAMPLE TO BLOOD BANK

## 2022-06-02 NOTE — Assessment & Plan Note (Signed)
He does not need chemotherapy for CLL I suspect his lymphocytosis and thrombocytopenia could be due to persistent disease but he is not symptomatic I will see him 6 months

## 2022-06-02 NOTE — Assessment & Plan Note (Signed)
He has intermittent elevated serum creatinine We discussed importance of adequate hydration and risk factor modification 

## 2022-06-02 NOTE — Assessment & Plan Note (Signed)
He was found to have severe iron deficiency anemia during recent hospitalization and had received several doses of intravenous iron I plan to recheck iron studies in his next visit

## 2022-06-02 NOTE — Progress Notes (Signed)
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 The patient knows to call the clinic with any problems, questions or concerns. The total time spent in the appointment was 20 minutes encounter with patients including review of chart and various tests results, discussions about plan of care and coordination of care plan   Artis Delay, MD 06/02/2022 2:17 PM  INTERVAL HISTORY: Please see below for problem oriented charting. he returns for the follow-up He had 1 episode of fall in February Since last time I saw him, he complained of mild fatigue but denies recent bleeding No recent infection  REVIEW OF SYSTEMS:   Constitutional: Denies fevers, chills or abnormal weight loss Eyes: Denies blurriness of vision Ears, nose, mouth, throat, and  face: Denies mucositis or sore throat Respiratory: Denies cough, dyspnea or wheezes Cardiovascular: Denies palpitation, chest discomfort or lower extremity swelling Gastrointestinal:  Denies nausea, heartburn or change in bowel habits Skin: Denies abnormal skin rashes Lymphatics: Denies new lymphadenopathy or easy bruising Neurological:Denies numbness, tingling or new weaknesses Behavioral/Psych: Mood is stable, no new changes  All other systems were reviewed with the patient and are negative.  I have reviewed the past medical history, past surgical history, social history and family history with the patient and they are unchanged from previous note.  ALLERGIES:  has No Known Allergies.  MEDICATIONS:  Current Outpatient Medications  Medication Sig Dispense Refill   acetaminophen (TYLENOL) 650 MG CR tablet Take 1 tablet (650 mg total) by mouth 2 (two) times daily as needed for pain. (Patient taking differently: Take 650 mg by mouth 2 (two) times daily.) 30 tablet 0   Cholecalciferol (VITAMIN D3) 1000 units CAPS Take 1,000 Units by mouth 2 (two) times daily.      Cyanocobalamin (VITAMIN B 12 PO) Take 1,000 mcg by mouth daily.     insulin NPH-regular Human (70-30) 100 UNIT/ML injection Inject 35 Units into the skin 2 (two) times daily.     mirtazapine (REMERON) 15 MG tablet Take 1 tablet (15 mg total) by mouth at bedtime.     morphine (MSIR) 15 MG tablet Take 15 mg by mouth 2 (two) times daily.     pantoprazole (PROTONIX) 40 MG tablet Take 40 mg by mouth daily.     polyethylene glycol (  MIRALAX / GLYCOLAX) packet Take 17 g by mouth daily. 14 each 0   tamsulosin (FLOMAX) 0.4 MG CAPS capsule Take 0.4 mg by mouth daily.     torsemide (DEMADEX) 20 MG tablet Take 20 mg by mouth daily.     No current facility-administered medications for this visit.    SUMMARY OF ONCOLOGIC HISTORY: Oncology History  CLL (chronic lymphocytic leukemia) (HCC)  04/03/2008 Pathology Results   Case #: [id number] FLow  cytomtry confirmed CLL   10/11/2014 Tumor Marker   FISH analysis showed trisomy 12 abnormality   04/29/2015 - 12/01/2021 Chemotherapy   He started taking Ibrutinib   04/29/2015 Imaging   Ct scan showed mild lymphadenopathy and splenomegaly   05/13/2015 Adverse Reaction   Due to neutropenia, dose of Ibrutinib is reduced to 280 mg daily   01/08/2016 Imaging   Response to therapy. Resolution of thoracic and abdominal adenopathy. Resolution of splenomegaly. 2. No new or progressive disease. 3. Small bowel containing left inguinal hernia and right-sided hydrocele, as before. 4. Esophageal air fluid level suggests dysmotility or gastroesophageal reflux. 5.  Possible constipation. 6. Suspect hepatic hemangiomas, similar.   10/09/2016 Imaging   No findings suspicious for active lymphomatous involvement.  No abdominopelvic lymphadenopathy.  Spleen is normal in size.  Moderate to large left inguinal/scrotal hernia containing small bowel and fat. No evidence of bowel obstruction.  Additional stable ancillary findings as above.   04/26/2017 Imaging   1. No lymphadenopathy in the chest, abdomen, or pelvis. 2. Mild circumferential bladder wall thickening. Incomplete distention may contribute to this appearance, but bladder infection is also consideration. 3. Stable appearance small hepatic lesions. Imaging features most suggestive of benign cavernous hemangiomas. 4. Stable tiny low-density renal lesions compatible with cysts. 5. Large left groin hernia contains numerous small bowel loops without complicating features. 6. Right-sided hydrocele.   11/15/2017 Imaging   Large recurrent left inguinal hernia with extension of multiple small bowel loops into the inguinal canal and scrotum on the left. Proximal small bowel dilatation is noted consistent with a degree of incarceration.   Bilateral small effusions as well as patchy bibasilar infiltrates.     12/14/2017 Adverse Reaction   Ibrutinib placed on  hold   06/17/2020 Cancer Staging   Staging form: Chronic Lymphocytic Leukemia / Small Lymphocytic Lymphoma, AJCC 8th Edition - Clinical stage from 06/17/2020: Modified Rai Stage 0 (Modified Rai risk: Low, Lymphocytosis: Present, Adenopathy: Absent, Organomegaly: Absent, Anemia: Absent, Thrombocytopenia: Absent) - Signed by Artis Delay, MD on 06/17/2020 Stage prefix: Initial diagnosis   11/27/2021 Imaging   Chest Impression:   1. No lymphadenopathy. 2. No pulmonary parenchymal findings   Abdomen / Pelvis Impression:   1. No evidence of lymphoma/lymphadenopathy in the abdomen pelvis. 2. Normal spleen. 3. Short segment of small bowel enters a RIGHT inguinal hernia. 4. RIGHT hydrocele .     PHYSICAL EXAMINATION: ECOG PERFORMANCE STATUS: 1 - Symptomatic but completely ambulatory  Vitals:   06/02/22 1149  BP: (!) 118/42  Pulse: (!) 59  Resp: 18  Temp: 98.2 F (36.8 C)  SpO2: 100%   Filed Weights   06/02/22 1149  Weight: 194 lb (88 kg)    GENERAL:alert, no distress and comfortable  NEURO: alert & oriented x 3 with fluent speech, no focal motor/sensory deficits  LABORATORY DATA:  I have reviewed the data as listed    Component Value Date/Time   NA 134 (L) 06/02/2022 1123   NA 140 10/09/2016 1106   K 5.1 06/02/2022 1123  K 4.7 10/09/2016 1106   CL 100 06/02/2022 1123   CL 104 03/28/2012 1426   CO2 28 06/02/2022 1123   CO2 28 10/09/2016 1106   GLUCOSE 221 (H) 06/02/2022 1123   GLUCOSE 127 10/09/2016 1106   GLUCOSE 171 (H) 03/28/2012 1426   BUN 26 (H) 06/02/2022 1123   BUN 20.8 10/09/2016 1106   CREATININE 1.30 (H) 06/02/2022 1123   CREATININE 1.1 10/09/2016 1106   CALCIUM 8.3 (L) 06/02/2022 1123   CALCIUM 9.1 10/09/2016 1106   PROT 6.4 (L) 06/02/2022 1123   PROT 5.8 (L) 10/09/2016 1106   ALBUMIN 3.5 06/02/2022 1123   ALBUMIN 3.1 (L) 10/09/2016 1106   AST 16 06/02/2022 1123   AST 17 10/09/2016 1106   ALT 8 06/02/2022 1123   ALT 8 10/09/2016 1106   ALKPHOS 57  06/02/2022 1123   ALKPHOS 46 10/09/2016 1106   BILITOT 0.5 06/02/2022 1123   BILITOT 0.23 10/09/2016 1106   GFRNONAA 53 (L) 06/02/2022 1123   GFRAA 59 (L) 09/12/2019 1034   GFRAA >60 09/06/2018 1016    No results found for: "SPEP", "UPEP"  Lab Results  Component Value Date   WBC 8.1 06/02/2022   NEUTROABS 2.1 06/02/2022   HGB 10.9 (L) 06/02/2022   HCT 32.5 (L) 06/02/2022   MCV 88.8 06/02/2022   PLT 95 (L) 06/02/2022      Chemistry      Component Value Date/Time   NA 134 (L) 06/02/2022 1123   NA 140 10/09/2016 1106   K 5.1 06/02/2022 1123   K 4.7 10/09/2016 1106   CL 100 06/02/2022 1123   CL 104 03/28/2012 1426   CO2 28 06/02/2022 1123   CO2 28 10/09/2016 1106   BUN 26 (H) 06/02/2022 1123   BUN 20.8 10/09/2016 1106   CREATININE 1.30 (H) 06/02/2022 1123   CREATININE 1.1 10/09/2016 1106      Component Value Date/Time   CALCIUM 8.3 (L) 06/02/2022 1123   CALCIUM 9.1 10/09/2016 1106   ALKPHOS 57 06/02/2022 1123   ALKPHOS 46 10/09/2016 1106   AST 16 06/02/2022 1123   AST 17 10/09/2016 1106   ALT 8 06/02/2022 1123   ALT 8 10/09/2016 1106   BILITOT 0.5 06/02/2022 1123   BILITOT 0.23 10/09/2016 1106

## 2022-06-03 ENCOUNTER — Telehealth: Payer: Self-pay

## 2022-06-03 DIAGNOSIS — Z79899 Other long term (current) drug therapy: Secondary | ICD-10-CM | POA: Diagnosis not present

## 2022-06-03 DIAGNOSIS — R7309 Other abnormal glucose: Secondary | ICD-10-CM | POA: Diagnosis not present

## 2022-06-03 DIAGNOSIS — R946 Abnormal results of thyroid function studies: Secondary | ICD-10-CM | POA: Diagnosis not present

## 2022-06-03 DIAGNOSIS — Z125 Encounter for screening for malignant neoplasm of prostate: Secondary | ICD-10-CM | POA: Diagnosis not present

## 2022-06-03 DIAGNOSIS — Z13220 Encounter for screening for lipoid disorders: Secondary | ICD-10-CM | POA: Diagnosis not present

## 2022-06-03 NOTE — Telephone Encounter (Signed)
Called and given below message. He verbalized understanding and appreciated the call. 

## 2022-06-03 NOTE — Telephone Encounter (Signed)
-----   Message from Artis Delay, MD sent at 06/03/2022  8:40 AM EDT ----- Call him, his hemoglobin A1c is very high He needs to reach out to his PCP

## 2022-06-10 DIAGNOSIS — I890 Lymphedema, not elsewhere classified: Secondary | ICD-10-CM | POA: Diagnosis not present

## 2022-06-10 DIAGNOSIS — D630 Anemia in neoplastic disease: Secondary | ICD-10-CM | POA: Diagnosis not present

## 2022-06-10 DIAGNOSIS — I739 Peripheral vascular disease, unspecified: Secondary | ICD-10-CM | POA: Diagnosis not present

## 2022-06-10 DIAGNOSIS — N1831 Chronic kidney disease, stage 3a: Secondary | ICD-10-CM | POA: Diagnosis not present

## 2022-06-10 DIAGNOSIS — I129 Hypertensive chronic kidney disease with stage 1 through stage 4 chronic kidney disease, or unspecified chronic kidney disease: Secondary | ICD-10-CM | POA: Diagnosis not present

## 2022-06-10 DIAGNOSIS — E1142 Type 2 diabetes mellitus with diabetic polyneuropathy: Secondary | ICD-10-CM | POA: Diagnosis not present

## 2022-06-10 DIAGNOSIS — E113551 Type 2 diabetes mellitus with stable proliferative diabetic retinopathy, right eye: Secondary | ICD-10-CM | POA: Diagnosis not present

## 2022-06-10 DIAGNOSIS — K219 Gastro-esophageal reflux disease without esophagitis: Secondary | ICD-10-CM | POA: Diagnosis not present

## 2022-06-10 DIAGNOSIS — E113592 Type 2 diabetes mellitus with proliferative diabetic retinopathy without macular edema, left eye: Secondary | ICD-10-CM | POA: Diagnosis not present

## 2022-06-10 DIAGNOSIS — Z794 Long term (current) use of insulin: Secondary | ICD-10-CM | POA: Diagnosis not present

## 2022-06-10 DIAGNOSIS — C9190 Lymphoid leukemia, unspecified not having achieved remission: Secondary | ICD-10-CM | POA: Diagnosis not present

## 2022-06-10 DIAGNOSIS — Z0000 Encounter for general adult medical examination without abnormal findings: Secondary | ICD-10-CM | POA: Diagnosis not present

## 2022-07-21 ENCOUNTER — Telehealth: Payer: Self-pay | Admitting: *Deleted

## 2022-07-21 NOTE — Progress Notes (Signed)
  Care Coordination  Outreach Note  07/21/2022 Name: Michael [name] MRN: [id number] DOB: December 04, 1930   Care Coordination Outreach Attempts: An unsuccessful telephone outreach was attempted today to offer the patient information about available care coordination services.  Follow Up Plan:  Additional outreach attempts will be made to offer the patient care coordination information and services.   Encounter Outcome:  No Answer  Christie Nottingham  Care Coordination Care Guide  Direct Dial: [phone]

## 2022-07-28 NOTE — Progress Notes (Signed)
  Care Coordination   Note   07/28/2022 Name: Sheri Hottinger MRN: [id number] DOB: 11-12-1930  Masaaki Sturgell is a 89 y.o. year old male who sees Irena Reichmann, [place] for primary care. I reached out to [name] by phone today to offer care coordination services.  Mr. Albers was given information about Care Coordination services today including:   The Care Coordination services include support from the care team which includes your Nurse Coordinator, Clinical Social Worker, or Pharmacist.  The Care Coordination team is here to help remove barriers to the health concerns and goals most important to you. Care Coordination services are voluntary, and the patient may decline or stop services at any time by request to their care team member.   Care Coordination Consent Status: Patient agreed to services and verbal consent obtained.   Follow up plan:  Telephone appointment with care coordination team member scheduled for:  08/19/22  Encounter Outcome:  Pt. Scheduled  [facility] Coordination Care Guide  Direct Dial: [phone]

## 2022-07-28 NOTE — Progress Notes (Signed)
  Care Coordination  Outreach Note  07/28/2022 Name: Tamel Kear MRN: [id number] DOB: 06/04/1930   Care Coordination Outreach Attempts: A second unsuccessful outreach was attempted today to offer the patient with information about available care coordination services.  Follow Up Plan:  Additional outreach attempts will be made to offer the patient care coordination information and services.   Encounter Outcome:  No Answer  Christie Nottingham  Care Coordination Care Guide  Direct Dial: [phone]

## 2022-08-19 ENCOUNTER — Ambulatory Visit: Payer: Self-pay

## 2022-08-19 NOTE — Patient Outreach (Signed)
  Care Coordination   08/19/2022 Name: Michael [name] MRN: [id number] DOB: Sep 16, 1930   Care Coordination Outreach Attempts:  An unsuccessful telephone outreach was attempted today to offer the patient information about available care coordination services.  Follow Up Plan:  Additional outreach attempts will be made to offer the patient care coordination information and services.   Encounter Outcome:  No Answer   Care Coordination Interventions:  No, not indicated    Bary Leriche, RN, MSN [facility] Care Management Care Management Coordinator Direct Line [phone]

## 2022-09-01 DIAGNOSIS — H179 Unspecified corneal scar and opacity: Secondary | ICD-10-CM | POA: Diagnosis not present

## 2022-09-01 DIAGNOSIS — H3561 Retinal hemorrhage, right eye: Secondary | ICD-10-CM | POA: Diagnosis not present

## 2022-09-01 DIAGNOSIS — E113553 Type 2 diabetes mellitus with stable proliferative diabetic retinopathy, bilateral: Secondary | ICD-10-CM | POA: Diagnosis not present

## 2022-09-07 DIAGNOSIS — E1142 Type 2 diabetes mellitus with diabetic polyneuropathy: Secondary | ICD-10-CM | POA: Diagnosis not present

## 2022-09-07 DIAGNOSIS — I129 Hypertensive chronic kidney disease with stage 1 through stage 4 chronic kidney disease, or unspecified chronic kidney disease: Secondary | ICD-10-CM | POA: Diagnosis not present

## 2022-09-07 DIAGNOSIS — E113592 Type 2 diabetes mellitus with proliferative diabetic retinopathy without macular edema, left eye: Secondary | ICD-10-CM | POA: Diagnosis not present

## 2022-09-09 ENCOUNTER — Telehealth: Payer: Self-pay

## 2022-09-09 ENCOUNTER — Ambulatory Visit: Payer: Self-pay

## 2022-09-09 NOTE — Patient Instructions (Signed)
Visit Information  Thank you for taking time to visit with me today. Please don't hesitate to contact me if I can be of assistance to you.   Following are the goals we discussed today:   Goals Addressed             This Visit's Progress    Diabetes Management       Patient Goals/Self Care Activities: -Patient/Caregiver will self-administer medications as prescribed as evidenced by self-report/primary caregiver report  -Patient/Caregiver will attend all scheduled provider appointments as evidenced by clinician review of documented attendance to scheduled appointments and patient/caregiver report -Patient/Caregiver will call provider office for new concerns or questions as evidenced by review of documented incoming telephone call notes and patient report  -check blood sugar at prescribed times -check blood sugar if I feel it is too high or too low -record values and write them down take them to all doctor visits  -check feet daily for cuts, sores or redness   Spoke with patient.  He is doing good.  His daughter lives with him.  He is independent with care and still drives.  He is diabetic.  Last blood sugar around 120.  Recent A1c 6.8.  Discussed diabetes management and encouraged to keep up the good work.  Previous A1c 9.0.  Eye exam about 2 weeks ago. No concerns.           Our next appointment is by telephone on 10/20/22 at 1:00 pm  Please call the care guide team at [phone] if you need to cancel or reschedule your appointment.   If you are experiencing a Mental Health or Behavioral Health Crisis or need someone to talk to, please call the Suicide and Crisis Lifeline: 988   The patient verbalized understanding of instructions, educational materials, and care plan provided today and agreed to receive a mailed copy of patient instructions, educational materials, and care plan.   The patient has been provided with contact information for the care management team and has been  advised to call with any health related questions or concerns.   Bary Leriche, RN, MSN [facility] Care Management Care Management Coordinator Direct Line [phone]

## 2022-09-09 NOTE — Patient Outreach (Signed)
  Care Coordination   Initial Visit Note   09/09/2022 Name: Michael [name] MRN: [id number] DOB: [birth year]  Michael [name] is a 89 y.o. year old male who sees Irena Reichmann, [place] for primary care. I spoke with  Michael [name] by phone today.  What matters to the patients health and wellness today?  Health Management    Goals Addressed             This Visit's Progress    Diabetes Management       Patient Goals/Self Care Activities: -Patient/Caregiver will self-administer medications as prescribed as evidenced by self-report/primary caregiver report  -Patient/Caregiver will attend all scheduled provider appointments as evidenced by clinician review of documented attendance to scheduled appointments and patient/caregiver report -Patient/Caregiver will call provider office for new concerns or questions as evidenced by review of documented incoming telephone call notes and patient report  -check blood sugar at prescribed times -check blood sugar if I feel it is too high or too low -record values and write them down take them to all doctor visits  -check feet daily for cuts, sores or redness   Spoke with patient.  He is doing good.  His daughter lives with him.  He is independent with care and still drives.  He is diabetic.  Last blood sugar around 120.  Recent A1c 6.8.  Discussed diabetes management and encouraged to keep up the good work.  Previous A1c 9.0.  Eye exam about 2 weeks ago. No concerns.           SDOH assessments and interventions completed:  Yes  SDOH Interventions Today    Flowsheet Row Most Recent Value  SDOH Interventions   Housing Interventions Intervention Not Indicated  Transportation Interventions Intervention Not Indicated        Care Coordination Interventions:  Yes, provided   Follow up plan: Follow up call scheduled for September    Encounter Outcome:  Pt. Visit Completed   Michael Leriche, RN, MSN [facility] Care Management Care Management  Coordinator Direct Line [phone]

## 2022-09-09 NOTE — Patient Outreach (Signed)
  Care Coordination   09/09/2022 Name: Michael [name] MRN: [id number] DOB: 06/05/1930   Care Coordination Outreach Attempts:  A second unsuccessful outreach was attempted today to offer the patient with information about available care coordination services.  Follow Up Plan:  Additional outreach attempts will be made to offer the patient care coordination information and services.   Encounter Outcome:  No Answer   Care Coordination Interventions:  No, not indicated    Bary Leriche, RN, MSN [facility] Care Management Care Management Coordinator Direct Line [phone]

## 2022-09-14 DIAGNOSIS — E1142 Type 2 diabetes mellitus with diabetic polyneuropathy: Secondary | ICD-10-CM | POA: Diagnosis not present

## 2022-09-14 DIAGNOSIS — I129 Hypertensive chronic kidney disease with stage 1 through stage 4 chronic kidney disease, or unspecified chronic kidney disease: Secondary | ICD-10-CM | POA: Diagnosis not present

## 2022-09-14 DIAGNOSIS — C9190 Lymphoid leukemia, unspecified not having achieved remission: Secondary | ICD-10-CM | POA: Diagnosis not present

## 2022-09-14 DIAGNOSIS — E113592 Type 2 diabetes mellitus with proliferative diabetic retinopathy without macular edema, left eye: Secondary | ICD-10-CM | POA: Diagnosis not present

## 2022-09-14 DIAGNOSIS — I739 Peripheral vascular disease, unspecified: Secondary | ICD-10-CM | POA: Diagnosis not present

## 2022-09-14 DIAGNOSIS — E7800 Pure hypercholesterolemia, unspecified: Secondary | ICD-10-CM | POA: Diagnosis not present

## 2022-09-17 DIAGNOSIS — Z79891 Long term (current) use of opiate analgesic: Secondary | ICD-10-CM | POA: Diagnosis not present

## 2022-09-17 DIAGNOSIS — G894 Chronic pain syndrome: Secondary | ICD-10-CM | POA: Diagnosis not present

## 2022-09-17 DIAGNOSIS — M5459 Other low back pain: Secondary | ICD-10-CM | POA: Diagnosis not present

## 2022-09-17 DIAGNOSIS — M5416 Radiculopathy, lumbar region: Secondary | ICD-10-CM | POA: Diagnosis not present

## 2022-10-20 ENCOUNTER — Ambulatory Visit: Payer: Self-pay

## 2022-10-20 NOTE — Patient Outreach (Signed)
Care Coordination   Follow Up Visit Note   10/20/2022 Name: Michael [name] MRN: [id number] DOB: 1930-05-23  Michael [name] is a 89 y.o. year old male who sees Irena Reichmann, [place] for primary care. I spoke with  [name] by phone today.  What matters to the patients health and wellness today?  Maintain health    Goals Addressed             This Visit's Progress    Diabetes Management       Patient Goals/Self Care Activities: -Patient/Caregiver will self-administer medications as prescribed as evidenced by self-report/primary caregiver report  -Patient/Caregiver will attend all scheduled provider appointments as evidenced by clinician review of documented attendance to scheduled appointments and patient/caregiver report -Patient/Caregiver will call provider office for new concerns or questions as evidenced by review of documented incoming telephone call notes and patient report  -check blood sugar at prescribed times -check blood sugar if I feel it is too high or too low -record values and write them down take them to all doctor visits  -check feet daily for cuts, sores or redness   Spoke with patient.  He is doing good.  Blood sugar ranging 97-191.  Reiterated diabetes management and discussed continuous glucose monitoring systems as patient states he saw one on TV that you just stick your finger in the machine. Advised that the only ones right now are the sensors like dexcom and libre. He states that his insurance will not cover those at this time as he only checks his sugar twice a day.  No concerns.           SDOH assessments and interventions completed:  Yes  SDOH Interventions Today    Flowsheet Row Most Recent Value  SDOH Interventions   Health Literacy Interventions Intervention Not Indicated        Care Coordination Interventions:  Yes, provided   Follow up plan: Follow up call scheduled for October    Encounter Outcome:  Patient Visit Completed    Bary Leriche, RN, MSN [place]  [facility], [facility] Management Community Coordinator Direct Dial: [phone]  Fax: [phone] Website: [email or web address]

## 2022-10-20 NOTE — Patient Instructions (Signed)
Visit Information  Thank you for taking time to visit with me today. Please don't hesitate to contact me if I can be of assistance to you.   Following are the goals we discussed today:   Goals Addressed             This Visit's Progress    Diabetes Management       Patient Goals/Self Care Activities: -Patient/Caregiver will self-administer medications as prescribed as evidenced by self-report/primary caregiver report  -Patient/Caregiver will attend all scheduled provider appointments as evidenced by clinician review of documented attendance to scheduled appointments and patient/caregiver report -Patient/Caregiver will call provider office for new concerns or questions as evidenced by review of documented incoming telephone call notes and patient report  -check blood sugar at prescribed times -check blood sugar if I feel it is too high or too low -record values and write them down take them to all doctor visits  -check feet daily for cuts, sores or redness   Spoke with patient.  He is doing good.  Blood sugar ranging 97-191.  Reiterated diabetes management and discussed continuous glucose monitoring systems as patient states he saw one on TV that you just stick your finger in the machine. Advised that the only ones right now are the sensors like dexcom and libre. He states that his insurance will not cover those at this time as he only checks his sugar twice a day.  No concerns.           Our next appointment is by telephone on 11/17/22 at 100 pm  Please call the care guide team at [phone] if you need to cancel or reschedule your appointment.   If you are experiencing a Mental Health or Behavioral Health Crisis or need someone to talk to, please call the Suicide and Crisis Lifeline: 988   Patient verbalizes understanding of instructions and care plan provided today and agrees to view in MyChart. Active MyChart status and patient understanding of how to access instructions and  care plan via MyChart confirmed with patient.     The patient has been provided with contact information for the care management team and has been advised to call with any health related questions or concerns.   Bary Leriche, RN, MSN [facility], [facility] Management Community Coordinator Direct Dial: [phone]  Fax: [phone] Website: [email or web address]

## 2022-11-17 ENCOUNTER — Ambulatory Visit: Payer: Self-pay

## 2022-11-17 NOTE — Patient Instructions (Signed)
Visit Information  Thank you for taking time to visit with me today. Please don't hesitate to contact me if I can be of assistance to you.   Following are the goals we discussed today:   Goals Addressed             This Visit's Progress    Diabetes Management       Patient Goals/Self Care Activities: -Patient/Caregiver will self-administer medications as prescribed as evidenced by self-report/primary caregiver report  -Patient/Caregiver will attend all scheduled provider appointments as evidenced by clinician review of documented attendance to scheduled appointments and patient/caregiver report -Patient/Caregiver will call provider office for new concerns or questions as evidenced by review of documented incoming telephone call notes and patient report  -check blood sugar at prescribed times -check blood sugar if I feel it is too high or too low -record values and write them down take them to all doctor visits  -check feet daily for cuts, sores or redness   Spoke with patient.  He is doing good.  Blood sugar ranging around 110-150.  Encouraged continued diabetes management and importance of diet.  He verbalized understanding.   No concerns.           Our next appointment is by telephone on 12/15/22 at 100 pm  Please call the care guide team at [phone] if you need to cancel or reschedule your appointment.   If you are experiencing a Mental Health or Behavioral Health Crisis or need someone to talk to, please call the Suicide and Crisis Lifeline: 988   Patient verbalizes understanding of instructions and care plan provided today and agrees to view in MyChart. Active MyChart status and patient understanding of how to access instructions and care plan via MyChart confirmed with patient.     The patient has been provided with contact information for the care management team and has been advised to call with any health related questions or concerns.   Bary Leriche, RN,  MSN [facility], [facility] Management Community Coordinator Direct Dial: [phone]  Fax: [phone] Website: [email or web address]

## 2022-11-17 NOTE — Patient Outreach (Signed)
Care Coordination   Follow Up Visit Note   11/17/2022 Name: Michael [name] MRN: [id number] DOB: 08-03-1930  Michael [name] is a 89 y.o. year old male who sees Irena Reichmann, [place] for primary care. I spoke with  Michael [name] by phone today.  What matters to the patients health and wellness today?  Maintain health    Goals Addressed             This Visit's Progress    Diabetes Management       Patient Goals/Self Care Activities: -Patient/Caregiver will self-administer medications as prescribed as evidenced by self-report/primary caregiver report  -Patient/Caregiver will attend all scheduled provider appointments as evidenced by clinician review of documented attendance to scheduled appointments and patient/caregiver report -Patient/Caregiver will call provider office for new concerns or questions as evidenced by review of documented incoming telephone call notes and patient report  -check blood sugar at prescribed times -check blood sugar if I feel it is too high or too low -record values and write them down take them to all doctor visits  -check feet daily for cuts, sores or redness   Spoke with patient.  He is doing good.  Blood sugar ranging around 110-150.  Encouraged continued diabetes management and importance of diet.  He verbalized understanding.   No concerns.           SDOH assessments and interventions completed:  Yes  SDOH Interventions Today    Flowsheet Row Most Recent Value  SDOH Interventions   Food Insecurity Interventions Intervention Not Indicated  Transportation Interventions Intervention Not Indicated        Care Coordination Interventions:  Yes, provided   Follow up plan: Follow up call scheduled for November    Encounter Outcome:  Patient Visit Completed   Bary Leriche, RN, MSN [place]  [facility], [facility] Management Community Coordinator Direct Dial: [phone]  Fax: [phone] Website:  [email or web address]

## 2022-12-03 ENCOUNTER — Inpatient Hospital Stay: Payer: Medicare HMO

## 2022-12-03 ENCOUNTER — Encounter: Payer: Self-pay | Admitting: Hematology and Oncology

## 2022-12-03 ENCOUNTER — Inpatient Hospital Stay: Payer: Medicare HMO | Attending: Hematology and Oncology | Admitting: Hematology and Oncology

## 2022-12-03 ENCOUNTER — Telehealth: Payer: Self-pay

## 2022-12-03 VITALS — BP 129/58 | HR 59 | Temp 98.4°F | Resp 18 | Ht 67.0 in | Wt 200.2 lb

## 2022-12-03 DIAGNOSIS — E119 Type 2 diabetes mellitus without complications: Secondary | ICD-10-CM | POA: Diagnosis not present

## 2022-12-03 DIAGNOSIS — C9110 Chronic lymphocytic leukemia of B-cell type not having achieved remission: Secondary | ICD-10-CM | POA: Insufficient documentation

## 2022-12-03 DIAGNOSIS — D61818 Other pancytopenia: Secondary | ICD-10-CM | POA: Diagnosis not present

## 2022-12-03 DIAGNOSIS — D630 Anemia in neoplastic disease: Secondary | ICD-10-CM

## 2022-12-03 DIAGNOSIS — Z79899 Other long term (current) drug therapy: Secondary | ICD-10-CM | POA: Diagnosis not present

## 2022-12-03 LAB — CBC WITH DIFFERENTIAL/PLATELET
Abs Immature Granulocytes: 0 10*3/uL (ref 0.00–0.07)
Basophils Absolute: 0 10*3/uL (ref 0.0–0.1)
Basophils Relative: 0 %
Eosinophils Absolute: 0.1 10*3/uL (ref 0.0–0.5)
Eosinophils Relative: 1 %
HCT: 34.3 % — ABNORMAL LOW (ref 39.0–52.0)
Hemoglobin: 11.7 g/dL — ABNORMAL LOW (ref 13.0–17.0)
Lymphocytes Relative: 65 %
Lymphs Abs: 6.6 10*3/uL — ABNORMAL HIGH (ref 0.7–4.0)
MCH: 30.7 pg (ref 26.0–34.0)
MCHC: 34.1 g/dL (ref 30.0–36.0)
MCV: 90 fL (ref 80.0–100.0)
Monocytes Absolute: 0.7 10*3/uL (ref 0.1–1.0)
Monocytes Relative: 7 %
Neutro Abs: 2.7 10*3/uL (ref 1.7–7.7)
Neutrophils Relative %: 27 %
Platelets: 88 10*3/uL — ABNORMAL LOW (ref 150–400)
RBC: 3.81 MIL/uL — ABNORMAL LOW (ref 4.22–5.81)
RDW: 14.4 % (ref 11.5–15.5)
Smear Review: NORMAL
WBC: 10.1 10*3/uL (ref 4.0–10.5)
nRBC: 0 % (ref 0.0–0.2)

## 2022-12-03 LAB — COMPREHENSIVE METABOLIC PANEL
ALT: 7 U/L (ref 0–44)
AST: 16 U/L (ref 15–41)
Albumin: 3.9 g/dL (ref 3.5–5.0)
Alkaline Phosphatase: 65 U/L (ref 38–126)
Anion gap: 5 (ref 5–15)
BUN: 34 mg/dL — ABNORMAL HIGH (ref 8–23)
CO2: 33 mmol/L — ABNORMAL HIGH (ref 22–32)
Calcium: 8.5 mg/dL — ABNORMAL LOW (ref 8.9–10.3)
Chloride: 99 mmol/L (ref 98–111)
Creatinine, Ser: 1.36 mg/dL — ABNORMAL HIGH (ref 0.61–1.24)
GFR, Estimated: 49 mL/min — ABNORMAL LOW (ref >60)
Glucose, Bld: 303 mg/dL — ABNORMAL HIGH (ref 70–99)
Potassium: 4.6 mmol/L (ref 3.5–5.1)
Sodium: 137 mmol/L (ref 135–145)
Total Bilirubin: 0.6 mg/dL (ref <1.2)
Total Protein: 6.5 g/dL (ref 6.5–8.1)

## 2022-12-03 LAB — IRON AND IRON BINDING CAPACITY (CC-WL,HP ONLY)
Iron: 201 ug/dL — ABNORMAL HIGH (ref 45–182)
Saturation Ratios: 64 % — ABNORMAL HIGH (ref 17.9–39.5)
TIBC: 315 ug/dL (ref 250–450)
UIBC: 114 ug/dL — ABNORMAL LOW (ref 117–376)

## 2022-12-03 LAB — FERRITIN: Ferritin: 66 ng/mL (ref 24–336)

## 2022-12-03 NOTE — Assessment & Plan Note (Signed)
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 The patient denies recent history of bleeding such as epistaxis, hematuria or hematochezia. He is asymptomatic from the anemia & low platelets. Overall it is improving. We will observe for now.  There is no contraindication to remain on antiplatelet agents or anticoagulants as long as the platelet is greater than 50,000.

## 2022-12-03 NOTE — Assessment & Plan Note (Signed)
He has poorly controlled diabetes We discussed the importance of getting his diabetes under control

## 2022-12-03 NOTE — Assessment & Plan Note (Signed)
He has signs of lymphocytosis again, I suspect due to relapse of CLL He is also noted to be mildly thrombocytopenic I recommend close observation in 3 months If his platelet count is worse, I plan to repeat imaging study

## 2022-12-03 NOTE — Telephone Encounter (Signed)
Patient called and provided with the recent lab results. Encouraged patient to monitor his blood sugars and ensure that he is compliant with taking his medications for his diabetes. Patient may wish to follow-up with PCP for further management.  Patient verbalized an understanding of the information and expressed appreciation for the call.

## 2022-12-03 NOTE — Telephone Encounter (Signed)
-----   Message from Artis Delay sent at 12/03/2022 12:28 PM EST ----- Pls call him His iron test is good but blood sugar is very high He needs to watch his blood sugar and be compliant with DM

## 2022-12-03 NOTE — Progress Notes (Signed)
[facility] OFFICE PROGRESS NOTE  Patient Care Team: Irena Reichmann, DO as PCP - General (Family Medicine) Artis Delay, MD as Consulting Physician (Hematology and Oncology) Fleeta Emmer, RN as [facility] Care Management  ASSESSMENT & PLAN:  CLL (chronic lymphocytic leukemia) [facility]) He has signs of lymphocytosis again, I suspect due to relapse of CLL He is also noted to be mildly thrombocytopenic I recommend close observation in 3 months If his platelet count is worse, I plan to repeat imaging study   Type II diabetes mellitus (HCC) He has poorly controlled diabetes We discussed the importance of getting his diabetes under control   Pancytopenia, 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 The patient denies recent history of bleeding such as epistaxis, hematuria or hematochezia. He is asymptomatic from the anemia & low platelets. Overall it is improving. We will observe for now.  There is no contraindication to remain on antiplatelet agents or anticoagulants as long as the platelet is greater than 50,000.    No orders of the defined types were placed in this encounter.   All questions were answered. The patient knows to call the clinic with any problems, questions or concerns. The total time spent in the appointment was 20 minutes encounter with patients including review of chart and various tests results, discussions about plan of care and coordination of care plan   Artis Delay, MD 12/03/2022 12:37 PM  INTERVAL HISTORY: Please see below for problem oriented charting. he returns for surveillance follow-up for history of CLL He is doing well No new lymphadenopathy or recent infection Denies recent bruising or bleeding We discussed test results and the need for close monitoring and follow-up  REVIEW OF SYSTEMS:   Constitutional: Denies fevers, chills or abnormal weight loss Eyes: Denies blurriness of vision Ears, nose, mouth,  throat, and face: Denies mucositis or sore throat Respiratory: Denies cough, dyspnea or wheezes Cardiovascular: Denies palpitation, chest discomfort or lower extremity swelling Gastrointestinal:  Denies nausea, heartburn or change in bowel habits Skin: Denies abnormal skin rashes Lymphatics: Denies new lymphadenopathy or easy bruising Neurological:Denies numbness, tingling or new weaknesses Behavioral/Psych: Mood is stable, no new changes  All other systems were reviewed with the patient and are negative.  I have reviewed the past medical history, past surgical history, social history and family history with the patient and they are unchanged from previous note.  ALLERGIES:  has No Known Allergies.  MEDICATIONS:  Current Outpatient Medications  Medication Sig Dispense Refill   acetaminophen (TYLENOL) 650 MG CR tablet Take 1 tablet (650 mg total) by mouth 2 (two) times daily as needed for pain. (Patient taking differently: Take 650 mg by mouth 2 (two) times daily.) 30 tablet 0   Cholecalciferol (VITAMIN D3) 1000 units CAPS Take 1,000 Units by mouth 2 (two) times daily.      Cyanocobalamin (VITAMIN B 12 PO) Take 1,000 mcg by mouth daily.     insulin NPH-regular Human (70-30) 100 UNIT/ML injection Inject 35 Units into the skin 2 (two) times daily.     mirtazapine (REMERON) 15 MG tablet Take 1 tablet (15 mg total) by mouth at bedtime.     morphine (MSIR) 15 MG tablet Take 15 mg by mouth 2 (two) times daily.     pantoprazole (PROTONIX) 40 MG tablet Take 40 mg by mouth daily.     polyethylene glycol (MIRALAX / GLYCOLAX) packet Take 17 g by mouth daily. 14 each 0   tamsulosin (FLOMAX) 0.4  MG CAPS capsule Take 0.4 mg by mouth daily.     torsemide (DEMADEX) 20 MG tablet Take 20 mg by mouth daily.     No current facility-administered medications for this visit.    SUMMARY OF ONCOLOGIC HISTORY: Oncology History  CLL (chronic lymphocytic leukemia) (HCC)  04/03/2008 Pathology Results   Case #:  [id number] FLow cytomtry confirmed CLL   10/11/2014 Tumor Marker   FISH analysis showed trisomy 12 abnormality   04/29/2015 - 12/01/2021 Chemotherapy   He started taking Ibrutinib   04/29/2015 Imaging   Ct scan showed mild lymphadenopathy and splenomegaly   05/13/2015 Adverse Reaction   Due to neutropenia, dose of Ibrutinib is reduced to 280 mg daily   01/08/2016 Imaging   Response to therapy. Resolution of thoracic and abdominal adenopathy. Resolution of splenomegaly. 2. No new or progressive disease. 3. Small bowel containing left inguinal hernia and right-sided hydrocele, as before. 4. Esophageal air fluid level suggests dysmotility or gastroesophageal reflux. 5.  Possible constipation. 6. Suspect hepatic hemangiomas, similar.   10/09/2016 Imaging   No findings suspicious for active lymphomatous involvement.  No abdominopelvic lymphadenopathy.  Spleen is normal in size.  Moderate to large left inguinal/scrotal hernia containing small bowel and fat. No evidence of bowel obstruction.  Additional stable ancillary findings as above.   04/26/2017 Imaging   1. No lymphadenopathy in the chest, abdomen, or pelvis. 2. Mild circumferential bladder wall thickening. Incomplete distention may contribute to this appearance, but bladder infection is also consideration. 3. Stable appearance small hepatic lesions. Imaging features most suggestive of benign cavernous hemangiomas. 4. Stable tiny low-density renal lesions compatible with cysts. 5. Large left groin hernia contains numerous small bowel loops without complicating features. 6. Right-sided hydrocele.   11/15/2017 Imaging   Large recurrent left inguinal hernia with extension of multiple small bowel loops into the inguinal canal and scrotum on the left. Proximal small bowel dilatation is noted consistent with a degree of incarceration.   Bilateral small effusions as well as patchy bibasilar infiltrates.     12/14/2017 Adverse Reaction    Ibrutinib placed on hold   06/17/2020 Cancer Staging   Staging form: Chronic Lymphocytic Leukemia / Small Lymphocytic Lymphoma, AJCC 8th Edition - Clinical stage from 06/17/2020: Modified Rai Stage 0 (Modified Rai risk: Low, Lymphocytosis: Present, Adenopathy: Absent, Organomegaly: Absent, Anemia: Absent, Thrombocytopenia: Absent) - Signed by Artis Delay, MD on 06/17/2020 Stage prefix: Initial diagnosis   11/27/2021 Imaging   Chest Impression:   1. No lymphadenopathy. 2. No pulmonary parenchymal findings   Abdomen / Pelvis Impression:   1. No evidence of lymphoma/lymphadenopathy in the abdomen pelvis. 2. Normal spleen. 3. Short segment of small bowel enters a RIGHT inguinal hernia. 4. RIGHT hydrocele .     PHYSICAL EXAMINATION: ECOG PERFORMANCE STATUS: 0 - Asymptomatic  Vitals:   12/03/22 1059  BP: (!) 129/58  Pulse: (!) 59  Resp: 18  Temp: 98.4 F (36.9 C)  SpO2: 99%   Filed Weights   12/03/22 1059  Weight: 200 lb 3.2 oz (90.8 kg)    GENERAL:alert, no distress and comfortable   LABORATORY DATA:  I have reviewed the data as listed    Component Value Date/Time   NA 137 12/03/2022 1034   NA 140 10/09/2016 1106   K 4.6 12/03/2022 1034   K 4.7 10/09/2016 1106   CL 99 12/03/2022 1034   CL 104 03/28/2012 1426   CO2 33 (H) 12/03/2022 1034   CO2 28 10/09/2016 1106   GLUCOSE 303 (  H) 12/03/2022 1034   GLUCOSE 127 10/09/2016 1106   GLUCOSE 171 (H) 03/28/2012 1426   BUN 34 (H) 12/03/2022 1034   BUN 20.8 10/09/2016 1106   CREATININE 1.36 (H) 12/03/2022 1034   CREATININE 1.30 (H) 06/02/2022 1123   CREATININE 1.1 10/09/2016 1106   CALCIUM 8.5 (L) 12/03/2022 1034   CALCIUM 9.1 10/09/2016 1106   PROT 6.5 12/03/2022 1034   PROT 5.8 (L) 10/09/2016 1106   ALBUMIN 3.9 12/03/2022 1034   ALBUMIN 3.1 (L) 10/09/2016 1106   AST 16 12/03/2022 1034   AST 16 06/02/2022 1123   AST 17 10/09/2016 1106   ALT 7 12/03/2022 1034   ALT 8 06/02/2022 1123   ALT 8 10/09/2016 1106    ALKPHOS 65 12/03/2022 1034   ALKPHOS 46 10/09/2016 1106   BILITOT 0.6 12/03/2022 1034   BILITOT 0.5 06/02/2022 1123   BILITOT 0.23 10/09/2016 1106   GFRNONAA 49 (L) 12/03/2022 1034   GFRNONAA 53 (L) 06/02/2022 1123   GFRAA 59 (L) 09/12/2019 1034   GFRAA >60 09/06/2018 1016    No results found for: "SPEP", "UPEP"  Lab Results  Component Value Date   WBC 10.1 12/03/2022   NEUTROABS 2.7 12/03/2022   HGB 11.7 (L) 12/03/2022   HCT 34.3 (L) 12/03/2022   MCV 90.0 12/03/2022   PLT 88 (L) 12/03/2022      Chemistry      Component Value Date/Time   NA 137 12/03/2022 1034   NA 140 10/09/2016 1106   K 4.6 12/03/2022 1034   K 4.7 10/09/2016 1106   CL 99 12/03/2022 1034   CL 104 03/28/2012 1426   CO2 33 (H) 12/03/2022 1034   CO2 28 10/09/2016 1106   BUN 34 (H) 12/03/2022 1034   BUN 20.8 10/09/2016 1106   CREATININE 1.36 (H) 12/03/2022 1034   CREATININE 1.30 (H) 06/02/2022 1123   CREATININE 1.1 10/09/2016 1106      Component Value Date/Time   CALCIUM 8.5 (L) 12/03/2022 1034   CALCIUM 9.1 10/09/2016 1106   ALKPHOS 65 12/03/2022 1034   ALKPHOS 46 10/09/2016 1106   AST 16 12/03/2022 1034   AST 16 06/02/2022 1123   AST 17 10/09/2016 1106   ALT 7 12/03/2022 1034   ALT 8 06/02/2022 1123   ALT 8 10/09/2016 1106   BILITOT 0.6 12/03/2022 1034   BILITOT 0.5 06/02/2022 1123   BILITOT 0.23 10/09/2016 1106

## 2022-12-15 ENCOUNTER — Ambulatory Visit: Payer: Self-pay

## 2022-12-15 NOTE — Patient Outreach (Signed)
  Care Coordination   Follow Up Visit Note   12/15/2022 Name: Michael [name] MRN: [id number] DOB: September 23, 1930  Michael [name] is a 89 y.o. year old male who sees Irena Reichmann, [place] for primary care. I spoke with  [name] by phone today.  What matters to the patients health and wellness today?  Keeping sugar controlled    Goals Addressed             This Visit's Progress    Diabetes Management       Patient Goals/Self Care Activities: -Patient/Caregiver will self-administer medications as prescribed as evidenced by self-report/primary caregiver report  -Patient/Caregiver will attend all scheduled provider appointments as evidenced by clinician review of documented attendance to scheduled appointments and patient/caregiver report -Patient/Caregiver will call provider office for new concerns or questions as evidenced by review of documented incoming telephone call notes and patient report  -check blood sugar at prescribed times -check blood sugar if I feel it is too high or too low -record values and write them down take them to all doctor visits  -check feet daily for cuts, sores or redness   Spoke with patient.  He is doing good, outside in the yard.  Blood sugar ranging around 130-200.  Discussed with patient the importance of continued diabetes management and importance of diet(limiting sweets and carbs).  He verbalized understanding.   No concerns.           SDOH assessments and interventions completed:  Yes     Care Coordination Interventions:  Yes, provided   Follow up plan: Follow up call scheduled for January    Encounter Outcome:  Patient Visit Completed   Bary Leriche, RN, MSN [place]  [facility], [facility] Management Community Coordinator Direct Dial: [phone]  Fax: [phone] Website: [email or web address]

## 2022-12-15 NOTE — Patient Instructions (Signed)
Visit Information  Thank you for taking time to visit with me today. Please don't hesitate to contact me if I can be of assistance to you.   Following are the goals we discussed today:   Goals Addressed             This Visit's Progress    Diabetes Management       Patient Goals/Self Care Activities: -Patient/Caregiver will self-administer medications as prescribed as evidenced by self-report/primary caregiver report  -Patient/Caregiver will attend all scheduled provider appointments as evidenced by clinician review of documented attendance to scheduled appointments and patient/caregiver report -Patient/Caregiver will call provider office for new concerns or questions as evidenced by review of documented incoming telephone call notes and patient report  -check blood sugar at prescribed times -check blood sugar if I feel it is too high or too low -record values and write them down take them to all doctor visits  -check feet daily for cuts, sores or redness   Spoke with patient.  He is doing good, outside in the yard.  Blood sugar ranging around 130-200.  Discussed with patient the importance of continued diabetes management and importance of diet(limiting sweets and carbs).  He verbalized understanding.   No concerns.           Our next appointment is by telephone on 02/09/23 at 100 pm  Please call the care guide team at [phone] if you need to cancel or reschedule your appointment.   If you are experiencing a Mental Health or Behavioral Health Crisis or need someone to talk to, please call the Suicide and Crisis Lifeline: 988   Patient verbalizes understanding of instructions and care plan provided today and agrees to view in MyChart. Active MyChart status and patient understanding of how to access instructions and care plan via MyChart confirmed with patient.     The patient has been provided with contact information for the care management team and has been advised to call  with any health related questions or concerns.   Bary Leriche, RN, MSN [facility], [facility] Management Community Coordinator Direct Dial: [phone]  Fax: [phone] Website: [email or web address]

## 2023-01-08 DIAGNOSIS — E785 Hyperlipidemia, unspecified: Secondary | ICD-10-CM | POA: Diagnosis not present

## 2023-01-08 DIAGNOSIS — R32 Unspecified urinary incontinence: Secondary | ICD-10-CM | POA: Diagnosis not present

## 2023-01-08 DIAGNOSIS — M4800 Spinal stenosis, site unspecified: Secondary | ICD-10-CM | POA: Diagnosis not present

## 2023-01-08 DIAGNOSIS — K5900 Constipation, unspecified: Secondary | ICD-10-CM | POA: Diagnosis not present

## 2023-01-08 DIAGNOSIS — I252 Old myocardial infarction: Secondary | ICD-10-CM | POA: Diagnosis not present

## 2023-01-08 DIAGNOSIS — N400 Enlarged prostate without lower urinary tract symptoms: Secondary | ICD-10-CM | POA: Diagnosis not present

## 2023-01-08 DIAGNOSIS — I2510 Atherosclerotic heart disease of native coronary artery without angina pectoris: Secondary | ICD-10-CM | POA: Diagnosis not present

## 2023-01-08 DIAGNOSIS — M1990 Unspecified osteoarthritis, unspecified site: Secondary | ICD-10-CM | POA: Diagnosis not present

## 2023-01-08 DIAGNOSIS — K219 Gastro-esophageal reflux disease without esophagitis: Secondary | ICD-10-CM | POA: Diagnosis not present

## 2023-01-08 DIAGNOSIS — N1831 Chronic kidney disease, stage 3a: Secondary | ICD-10-CM | POA: Diagnosis not present

## 2023-01-08 DIAGNOSIS — E1142 Type 2 diabetes mellitus with diabetic polyneuropathy: Secondary | ICD-10-CM | POA: Diagnosis not present

## 2023-01-08 DIAGNOSIS — E669 Obesity, unspecified: Secondary | ICD-10-CM | POA: Diagnosis not present

## 2023-01-13 DIAGNOSIS — G894 Chronic pain syndrome: Secondary | ICD-10-CM | POA: Diagnosis not present

## 2023-01-13 DIAGNOSIS — Z79891 Long term (current) use of opiate analgesic: Secondary | ICD-10-CM | POA: Diagnosis not present

## 2023-01-13 DIAGNOSIS — M5459 Other low back pain: Secondary | ICD-10-CM | POA: Diagnosis not present

## 2023-01-13 DIAGNOSIS — M5416 Radiculopathy, lumbar region: Secondary | ICD-10-CM | POA: Diagnosis not present

## 2023-02-09 ENCOUNTER — Ambulatory Visit: Payer: Self-pay

## 2023-02-09 NOTE — Patient Outreach (Signed)
  Care Coordination   Follow Up Visit Note   02/09/2023 Name: Michael [name] MRN: [id number] DOB: 1930/10/15  Michael [name] is a 89 y.o. year old male who sees [name], [place] for primary care. I spoke with  Michael [name] by phone today.  What matters to the patients health and wellness today?  Maintain health    Goals Addressed             This Visit's Progress    Diabetes Management       Patient Goals/Self Care Activities: -Patient/Caregiver will self-administer medications as prescribed as evidenced by self-report/primary caregiver report  -Patient/Caregiver will attend all scheduled provider appointments as evidenced by clinician review of documented attendance to scheduled appointments and patient/caregiver report -Patient/Caregiver will call provider office for new concerns or questions as evidenced by review of documented incoming telephone call notes and patient report  -check blood sugar at prescribed times -check blood sugar if I feel it is too high or too low -record values and write them down take them to all doctor visits  -check feet daily for cuts, sores or redness   Spoke with patient.  He is doing pretty good.  Blood sugar ranging around 98-200.  Reiterated the importance of continued diabetes management and importance of diet(limiting sweets and carbs).  He verbalized understanding.   No concerns.           SDOH assessments and interventions completed:  Yes  SDOH Interventions Today    Flowsheet Row Most Recent Value  SDOH Interventions   Food Insecurity Interventions Intervention Not Indicated  Housing Interventions Intervention Not Indicated  Transportation Interventions Intervention Not Indicated  Social Connections Interventions Intervention Not Indicated        Care Coordination Interventions:  Yes, provided   Follow up plan: Follow up call scheduled for March    Encounter Outcome:  Patient Visit Completed   [name] J [name], RN,  MSN RN Care Manager [facility], Population Health Direct Dial: [phone]  Fax: [phone] Website: [email or web address]

## 2023-02-09 NOTE — Patient Instructions (Signed)
 Visit Information  Thank you for taking time to visit with me today. Please don't hesitate to contact me if I can be of assistance to you.   Following are the goals we discussed today:   Goals Addressed             This Visit's Progress    Diabetes Management       Patient Goals/Self Care Activities: -Patient/Caregiver will self-administer medications as prescribed as evidenced by self-report/primary caregiver report  -Patient/Caregiver will attend all scheduled provider appointments as evidenced by clinician review of documented attendance to scheduled appointments and patient/caregiver report -Patient/Caregiver will call provider office for new concerns or questions as evidenced by review of documented incoming telephone call notes and patient report  -check blood sugar at prescribed times -check blood sugar if I feel it is too high or too low -record values and write them down take them to all doctor visits  -check feet daily for cuts, sores or redness   Spoke with patient.  He is doing pretty good.  Blood sugar ranging around 98-200.  Reiterated the importance of continued diabetes management and importance of diet(limiting sweets and carbs).  He verbalized understanding.   No concerns.           Our next appointment is by telephone on 04/06/23 at 100 pm  Please call the care guide team at [phone] if you need to cancel or reschedule your appointment.   If you are experiencing a Mental Health or Behavioral Health Crisis or need someone to talk to, please call the Suicide and Crisis Lifeline: 988   Patient verbalizes understanding of instructions and care plan provided today and agrees to view in MyChart. Active MyChart status and patient understanding of how to access instructions and care plan via MyChart confirmed with patient.     The patient has been provided with contact information for the care management team and has been advised to call with any health related  questions or concerns.   Tawanda Schall J Rosemond Lyttle, RN, MSN RN Care Manager [facility], Population Health Direct Dial: [phone]  Fax: [phone] Website: [email or web address]

## 2023-02-22 ENCOUNTER — Telehealth: Payer: Self-pay

## 2023-02-22 NOTE — Telephone Encounter (Signed)
Called and moved appt times for appts on 2/7. He is aware of appt date/time.

## 2023-03-02 DIAGNOSIS — Z9889 Other specified postprocedural states: Secondary | ICD-10-CM | POA: Diagnosis not present

## 2023-03-02 DIAGNOSIS — H179 Unspecified corneal scar and opacity: Secondary | ICD-10-CM | POA: Diagnosis not present

## 2023-03-02 DIAGNOSIS — E113553 Type 2 diabetes mellitus with stable proliferative diabetic retinopathy, bilateral: Secondary | ICD-10-CM | POA: Diagnosis not present

## 2023-03-02 DIAGNOSIS — H3561 Retinal hemorrhage, right eye: Secondary | ICD-10-CM | POA: Diagnosis not present

## 2023-03-05 ENCOUNTER — Encounter: Payer: Self-pay | Admitting: Hematology and Oncology

## 2023-03-05 ENCOUNTER — Inpatient Hospital Stay: Payer: Medicare HMO | Admitting: Hematology and Oncology

## 2023-03-05 ENCOUNTER — Ambulatory Visit: Payer: Medicare HMO | Admitting: Hematology and Oncology

## 2023-03-05 ENCOUNTER — Other Ambulatory Visit: Payer: Medicare HMO

## 2023-03-05 ENCOUNTER — Inpatient Hospital Stay: Payer: Medicare HMO | Attending: Hematology and Oncology

## 2023-03-05 VITALS — BP 123/50 | HR 91 | Temp 97.7°F | Resp 17 | Ht 67.0 in | Wt 220.0 lb

## 2023-03-05 DIAGNOSIS — R600 Localized edema: Secondary | ICD-10-CM

## 2023-03-05 DIAGNOSIS — C9110 Chronic lymphocytic leukemia of B-cell type not having achieved remission: Secondary | ICD-10-CM | POA: Diagnosis not present

## 2023-03-05 LAB — COMPREHENSIVE METABOLIC PANEL
ALT: 6 U/L (ref 0–44)
AST: 16 U/L (ref 15–41)
Albumin: 3.9 g/dL (ref 3.5–5.0)
Alkaline Phosphatase: 63 U/L (ref 38–126)
Anion gap: 5 (ref 5–15)
BUN: 28 mg/dL — ABNORMAL HIGH (ref 8–23)
CO2: 33 mmol/L — ABNORMAL HIGH (ref 22–32)
Calcium: 8.5 mg/dL — ABNORMAL LOW (ref 8.9–10.3)
Chloride: 101 mmol/L (ref 98–111)
Creatinine, Ser: 1.31 mg/dL — ABNORMAL HIGH (ref 0.61–1.24)
GFR, Estimated: 52 mL/min — ABNORMAL LOW (ref >60)
Glucose, Bld: 158 mg/dL — ABNORMAL HIGH (ref 70–99)
Potassium: 4.7 mmol/L (ref 3.5–5.1)
Sodium: 139 mmol/L (ref 135–145)
Total Bilirubin: 0.5 mg/dL (ref 0.0–1.2)
Total Protein: 6.5 g/dL (ref 6.5–8.1)

## 2023-03-05 LAB — CBC WITH DIFFERENTIAL/PLATELET
Abs Immature Granulocytes: 0.04 10*3/uL (ref 0.00–0.07)
Basophils Absolute: 0.1 10*3/uL (ref 0.0–0.1)
Basophils Relative: 1 %
Eosinophils Absolute: 0.1 10*3/uL (ref 0.0–0.5)
Eosinophils Relative: 1 %
HCT: 35.2 % — ABNORMAL LOW (ref 39.0–52.0)
Hemoglobin: 11.7 g/dL — ABNORMAL LOW (ref 13.0–17.0)
Immature Granulocytes: 0 %
Lymphocytes Relative: 54 %
Lymphs Abs: 6.6 10*3/uL — ABNORMAL HIGH (ref 0.7–4.0)
MCH: 30.3 pg (ref 26.0–34.0)
MCHC: 33.2 g/dL (ref 30.0–36.0)
MCV: 91.2 fL (ref 80.0–100.0)
Monocytes Absolute: 2.4 10*3/uL — ABNORMAL HIGH (ref 0.1–1.0)
Monocytes Relative: 20 %
Neutro Abs: 2.9 10*3/uL (ref 1.7–7.7)
Neutrophils Relative %: 24 %
Platelets: 90 10*3/uL — ABNORMAL LOW (ref 150–400)
RBC: 3.86 MIL/uL — ABNORMAL LOW (ref 4.22–5.81)
RDW: 15 % (ref 11.5–15.5)
Smear Review: NORMAL
WBC: 12.1 10*3/uL — ABNORMAL HIGH (ref 4.0–10.5)
nRBC: 0 % (ref 0.0–0.2)

## 2023-03-05 NOTE — Assessment & Plan Note (Signed)
 He has significant weight gain over the past 3 months His legs are grossly edematous despite being on Demadex  I recommend the patient to reach out to his primary care doctor for medication adjustment of further treatment

## 2023-03-05 NOTE — Assessment & Plan Note (Signed)
 He has signs of lymphocytosis again, I suspect due to relapse of CLL He is also noted to be mildly thrombocytopenic but overall stable I recommend close observation in 3 months If his platelet count is worse, I plan to repeat imaging study

## 2023-03-05 NOTE — Progress Notes (Signed)
 [facility] OFFICE PROGRESS NOTE  Patient Care Team: [name], DO as PCP - General (Family Medicine) Lonn Hicks, MD as Consulting Physician (Hematology and Oncology) Leath, Dionne, RN as [facility] Care Management  ASSESSMENT & PLAN:  CLL (chronic lymphocytic leukemia) [facility]) He has signs of lymphocytosis again, I suspect due to relapse of CLL He is also noted to be mildly thrombocytopenic but overall stable I recommend close observation in 3 months If his platelet count is worse, I plan to repeat imaging study   Bilateral leg edema He has significant weight gain over the past 3 months His legs are grossly edematous despite being on Demadex  I recommend the patient to reach out to his primary care doctor for medication adjustment of further treatment  No orders of the defined types were placed in this encounter.   All questions were answered. The patient knows to call the clinic with any problems, questions or concerns. The total time spent in the appointment was 20 minutes encounter with patients including review of chart and various tests results, discussions about plan of care and coordination of care plan   Hicks Lonn, MD 03/05/2023 12:00 PM  INTERVAL HISTORY: Please see below for problem oriented charting. he returns for surveillance follow-up He denies recent infection He is noted to have 20 pound weight gain in the last 3 months The patient is on diuretic but despite that, his legs are still swollen He denies shortness of breath We discussed CBC results  REVIEW OF SYSTEMS:   Constitutional: Denies fevers, chills or abnormal weight loss Eyes: Denies blurriness of vision Ears, nose, mouth, throat, and face: Denies mucositis or sore throat Respiratory: Denies cough, dyspnea or wheezes Cardiovascular: Denies palpitation, chest discomfort or lower extremity swelling Gastrointestinal:  Denies nausea, heartburn or change in bowel habits Skin:  Denies abnormal skin rashes Lymphatics: Denies new lymphadenopathy or easy bruising Neurological:Denies numbness, tingling or new weaknesses Behavioral/Psych: Mood is stable, no new changes  All other systems were reviewed with the patient and are negative.  I have reviewed the past medical history, past surgical history, social history and family history with the patient and they are unchanged from previous note.  ALLERGIES:  has no known allergies.  MEDICATIONS:  Current Outpatient Medications  Medication Sig Dispense Refill   acetaminophen  (TYLENOL ) 650 MG CR tablet Take 1 tablet (650 mg total) by mouth 2 (two) times daily as needed for pain. (Patient taking differently: Take 650 mg by mouth 2 (two) times daily.) 30 tablet 0   Cholecalciferol  (VITAMIN D3) 1000 units CAPS Take 1,000 Units by mouth 2 (two) times daily.      Cyanocobalamin  (VITAMIN B 12 PO) Take 1,000 mcg by mouth daily.     insulin  NPH-regular Human (70-30) 100 UNIT/ML injection Inject 35 Units into the skin 2 (two) times daily.     mirtazapine  (REMERON ) 15 MG tablet Take 1 tablet (15 mg total) by mouth at bedtime.     morphine  (MSIR) 15 MG tablet Take 15 mg by mouth 2 (two) times daily.     pantoprazole  (PROTONIX ) 40 MG tablet Take 40 mg by mouth daily.     polyethylene glycol (MIRALAX  / GLYCOLAX ) packet Take 17 g by mouth daily. 14 each 0   tamsulosin  (FLOMAX ) 0.4 MG CAPS capsule Take 0.4 mg by mouth daily.     torsemide  (DEMADEX ) 20 MG tablet Take 20 mg by mouth daily.     No current facility-administered medications for this visit.  SUMMARY OF ONCOLOGIC HISTORY: Oncology History  CLL (chronic lymphocytic leukemia) (HCC)  04/03/2008 Pathology Results   Case #: [id number] FLow cytomtry confirmed CLL   10/11/2014 Tumor Marker   FISH analysis showed trisomy 12 abnormality   04/29/2015 - 12/01/2021 Chemotherapy   He started taking Ibrutinib    04/29/2015 Imaging   Ct scan showed mild lymphadenopathy and splenomegaly    05/13/2015 Adverse Reaction   Due to neutropenia, dose of Ibrutinib  is reduced to 280 mg daily   01/08/2016 Imaging   Response to therapy. Resolution of thoracic and abdominal adenopathy. Resolution of splenomegaly. 2. No new or progressive disease. 3. Small bowel containing left inguinal hernia and right-sided hydrocele, as before. 4. Esophageal air fluid level suggests dysmotility or gastroesophageal reflux. 5.  Possible constipation. 6. Suspect hepatic hemangiomas, similar.   10/09/2016 Imaging   No findings suspicious for active lymphomatous involvement.  No abdominopelvic lymphadenopathy.  Spleen is normal in size.  Moderate to large left inguinal/scrotal hernia containing small bowel and fat. No evidence of bowel obstruction.  Additional stable ancillary findings as above.   04/26/2017 Imaging   1. No lymphadenopathy in the chest, abdomen, or pelvis. 2. Mild circumferential bladder wall thickening. Incomplete distention may contribute to this appearance, but bladder infection is also consideration. 3. Stable appearance small hepatic lesions. Imaging features most suggestive of benign cavernous hemangiomas. 4. Stable tiny low-density renal lesions compatible with cysts. 5. Large left groin hernia contains numerous small bowel loops without complicating features. 6. Right-sided hydrocele.   11/15/2017 Imaging   Large recurrent left inguinal hernia with extension of multiple small bowel loops into the inguinal canal and scrotum on the left. Proximal small bowel dilatation is noted consistent with a degree of incarceration.   Bilateral small effusions as well as patchy bibasilar infiltrates.     12/14/2017 Adverse Reaction   Ibrutinib  placed on hold   06/17/2020 Cancer Staging   Staging form: Chronic Lymphocytic Leukemia / Small Lymphocytic Lymphoma, AJCC 8th Edition - Clinical stage from 06/17/2020: Modified Rai Stage 0 (Modified Rai risk: Low, Lymphocytosis: Present, Adenopathy:  Absent, Organomegaly: Absent, Anemia: Absent, Thrombocytopenia: Absent) - Signed by Lonn Hicks, MD on 06/17/2020 Stage prefix: Initial diagnosis   11/27/2021 Imaging   Chest Impression:   1. No lymphadenopathy. 2. No pulmonary parenchymal findings   Abdomen / Pelvis Impression:   1. No evidence of lymphoma/lymphadenopathy in the abdomen pelvis. 2. Normal spleen. 3. Short segment of small bowel enters a RIGHT inguinal hernia. 4. RIGHT hydrocele .     PHYSICAL EXAMINATION: ECOG PERFORMANCE STATUS: 1 - Symptomatic but completely ambulatory  Vitals:   03/05/23 1123  BP: (!) 123/50  Pulse: 91  Resp: 17  Temp: 97.7 F (36.5 C)  SpO2: 100%   Filed Weights   03/05/23 1123  Weight: 220 lb (99.8 kg)    GENERAL:alert, no distress and comfortable HEART: He has gross bilateral lower extremity edema NEURO: alert & oriented x 3 with fluent speech, no focal motor/sensory deficits  LABORATORY DATA:  I have reviewed the data as listed    Component Value Date/Time   NA 139 03/05/2023 1056   NA 140 10/09/2016 1106   K 4.7 03/05/2023 1056   K 4.7 10/09/2016 1106   CL 101 03/05/2023 1056   CL 104 03/28/2012 1426   CO2 33 (H) 03/05/2023 1056   CO2 28 10/09/2016 1106   GLUCOSE 158 (H) 03/05/2023 1056   GLUCOSE 127 10/09/2016 1106   GLUCOSE 171 (H) 03/28/2012 1426  BUN 28 (H) 03/05/2023 1056   BUN 20.8 10/09/2016 1106   CREATININE 1.31 (H) 03/05/2023 1056   CREATININE 1.30 (H) 06/02/2022 1123   CREATININE 1.1 10/09/2016 1106   CALCIUM  8.5 (L) 03/05/2023 1056   CALCIUM  9.1 10/09/2016 1106   PROT 6.5 03/05/2023 1056   PROT 5.8 (L) 10/09/2016 1106   ALBUMIN 3.9 03/05/2023 1056   ALBUMIN 3.1 (L) 10/09/2016 1106   AST 16 03/05/2023 1056   AST 16 06/02/2022 1123   AST 17 10/09/2016 1106   ALT 6 03/05/2023 1056   ALT 8 06/02/2022 1123   ALT 8 10/09/2016 1106   ALKPHOS 63 03/05/2023 1056   ALKPHOS 46 10/09/2016 1106   BILITOT 0.5 03/05/2023 1056   BILITOT 0.5 06/02/2022 1123    BILITOT 0.23 10/09/2016 1106   GFRNONAA 52 (L) 03/05/2023 1056   GFRNONAA 53 (L) 06/02/2022 1123   GFRAA 59 (L) 09/12/2019 1034   GFRAA >60 09/06/2018 1016    No results found for: SPEP, UPEP  Lab Results  Component Value Date   WBC 12.1 (H) 03/05/2023   NEUTROABS PENDING 03/05/2023   HGB 11.7 (L) 03/05/2023   HCT 35.2 (L) 03/05/2023   MCV 91.2 03/05/2023   PLT 90 (L) 03/05/2023      Chemistry      Component Value Date/Time   NA 139 03/05/2023 1056   NA 140 10/09/2016 1106   K 4.7 03/05/2023 1056   K 4.7 10/09/2016 1106   CL 101 03/05/2023 1056   CL 104 03/28/2012 1426   CO2 33 (H) 03/05/2023 1056   CO2 28 10/09/2016 1106   BUN 28 (H) 03/05/2023 1056   BUN 20.8 10/09/2016 1106   CREATININE 1.31 (H) 03/05/2023 1056   CREATININE 1.30 (H) 06/02/2022 1123   CREATININE 1.1 10/09/2016 1106      Component Value Date/Time   CALCIUM  8.5 (L) 03/05/2023 1056   CALCIUM  9.1 10/09/2016 1106   ALKPHOS 63 03/05/2023 1056   ALKPHOS 46 10/09/2016 1106   AST 16 03/05/2023 1056   AST 16 06/02/2022 1123   AST 17 10/09/2016 1106   ALT 6 03/05/2023 1056   ALT 8 06/02/2022 1123   ALT 8 10/09/2016 1106   BILITOT 0.5 03/05/2023 1056   BILITOT 0.5 06/02/2022 1123   BILITOT 0.23 10/09/2016 1106

## 2023-03-10 DIAGNOSIS — E1142 Type 2 diabetes mellitus with diabetic polyneuropathy: Secondary | ICD-10-CM | POA: Diagnosis not present

## 2023-03-10 DIAGNOSIS — E113592 Type 2 diabetes mellitus with proliferative diabetic retinopathy without macular edema, left eye: Secondary | ICD-10-CM | POA: Diagnosis not present

## 2023-03-10 DIAGNOSIS — I129 Hypertensive chronic kidney disease with stage 1 through stage 4 chronic kidney disease, or unspecified chronic kidney disease: Secondary | ICD-10-CM | POA: Diagnosis not present

## 2023-03-10 DIAGNOSIS — E7800 Pure hypercholesterolemia, unspecified: Secondary | ICD-10-CM | POA: Diagnosis not present

## 2023-03-22 DIAGNOSIS — R7989 Other specified abnormal findings of blood chemistry: Secondary | ICD-10-CM | POA: Diagnosis not present

## 2023-03-22 DIAGNOSIS — N1831 Chronic kidney disease, stage 3a: Secondary | ICD-10-CM | POA: Diagnosis not present

## 2023-03-22 DIAGNOSIS — C9190 Lymphoid leukemia, unspecified not having achieved remission: Secondary | ICD-10-CM | POA: Diagnosis not present

## 2023-03-22 DIAGNOSIS — N401 Enlarged prostate with lower urinary tract symptoms: Secondary | ICD-10-CM | POA: Diagnosis not present

## 2023-03-22 DIAGNOSIS — Z794 Long term (current) use of insulin: Secondary | ICD-10-CM | POA: Diagnosis not present

## 2023-03-22 DIAGNOSIS — E1142 Type 2 diabetes mellitus with diabetic polyneuropathy: Secondary | ICD-10-CM | POA: Diagnosis not present

## 2023-03-22 DIAGNOSIS — E113592 Type 2 diabetes mellitus with proliferative diabetic retinopathy without macular edema, left eye: Secondary | ICD-10-CM | POA: Diagnosis not present

## 2023-04-01 ENCOUNTER — Telehealth: Payer: Self-pay

## 2023-04-01 NOTE — Patient Outreach (Signed)
 Care Coordination   Follow Up Visit Note   04/01/2023 Name: Michael [name] MRN: [id number] DOB: [birth year]  Michael [name] is a 89 y.o. year old male who sees Irena Reichmann, [place] for primary care. I spoke with  [name] by phone today.  What matters to the patients health and wellness today?  Maintaining health    Goals Addressed             This Visit's Progress    COMPLETED: Diabetes Management       Patient Goals/Self Care Activities: -Patient/Caregiver will self-administer medications as prescribed as evidenced by self-report/primary caregiver report  -Patient/Caregiver will attend all scheduled provider appointments as evidenced by clinician review of documented attendance to scheduled appointments and patient/caregiver report -Patient/Caregiver will call provider office for new concerns or questions as evidenced by review of documented incoming telephone call notes and patient report  -check blood sugar at prescribed times -check blood sugar if I feel it is too high or too low -record values and write them down take them to all doctor visits  -check feet daily for cuts, sores or redness   Spoke with patient.  He is doing okay today no complaints.  pretty good.  Blood sugar ranging around 100-200.  Reviewed the importance of continued diabetes management and importance of diet(limiting sweets and carbs).  He verbalized understanding.   No concerns.  Discussed case closure as patient continues to self manage well.  He is agreeable.         SDOH assessments and interventions completed:  Yes     Care Coordination Interventions:  Yes, provided    Follow up plan: No further intervention required.   Encounter Outcome:  Patient Visit Completed   Bary Leriche RN, MSN [facility], [facility] RN Care Manager Direct Dial: [phone]  Fax: [phone] Website: [email or web address]

## 2023-04-01 NOTE — Patient Instructions (Signed)
 Visit Information  Thank you for taking time to visit with me today. Please don't hesitate to contact me if I can be of assistance to you.   Following are the goals we discussed today:   Goals Addressed             This Visit's Progress    COMPLETED: Diabetes Management       Patient Goals/Self Care Activities: -Patient/Caregiver will self-administer medications as prescribed as evidenced by self-report/primary caregiver report  -Patient/Caregiver will attend all scheduled provider appointments as evidenced by clinician review of documented attendance to scheduled appointments and patient/caregiver report -Patient/Caregiver will call provider office for new concerns or questions as evidenced by review of documented incoming telephone call notes and patient report  -check blood sugar at prescribed times -check blood sugar if I feel it is too high or too low -record values and write them down take them to all doctor visits  -check feet daily for cuts, sores or redness   Spoke with patient.  He is doing okay today no complaints.  pretty good.  Blood sugar ranging around 100-200.  Reviewed the importance of continued diabetes management and importance of diet(limiting sweets and carbs).  He verbalized understanding.   No concerns.  Discussed case closure as patient continues to self manage well.  He is agreeable.           If you are experiencing a Mental Health or Behavioral Health Crisis or need someone to talk to, please call the Suicide and Crisis Lifeline: 988   Patient verbalizes understanding of instructions and care plan provided today and agrees to view in MyChart. Active MyChart status and patient understanding of how to access instructions and care plan via MyChart confirmed with patient.     The patient has been provided with contact information for the care management team and has been advised to call with any health related questions or concerns.   Bary Leriche RN,  MSN [facility], [facility] Health RN Care Manager Direct Dial: [phone]  Fax: [phone] Website: [email or web address]

## 2023-05-20 DIAGNOSIS — M5459 Other low back pain: Secondary | ICD-10-CM | POA: Diagnosis not present

## 2023-05-20 DIAGNOSIS — M5416 Radiculopathy, lumbar region: Secondary | ICD-10-CM | POA: Diagnosis not present

## 2023-05-20 DIAGNOSIS — Z5181 Encounter for therapeutic drug level monitoring: Secondary | ICD-10-CM | POA: Diagnosis not present

## 2023-05-20 DIAGNOSIS — Z79891 Long term (current) use of opiate analgesic: Secondary | ICD-10-CM | POA: Diagnosis not present

## 2023-05-20 DIAGNOSIS — G894 Chronic pain syndrome: Secondary | ICD-10-CM | POA: Diagnosis not present

## 2023-05-20 DIAGNOSIS — Z79899 Other long term (current) drug therapy: Secondary | ICD-10-CM | POA: Diagnosis not present

## 2023-06-03 ENCOUNTER — Inpatient Hospital Stay: Payer: Medicare HMO | Attending: Hematology and Oncology | Admitting: Hematology and Oncology

## 2023-06-03 ENCOUNTER — Other Ambulatory Visit: Payer: Self-pay

## 2023-06-03 ENCOUNTER — Encounter: Payer: Self-pay | Admitting: Hematology and Oncology

## 2023-06-03 ENCOUNTER — Inpatient Hospital Stay: Payer: Medicare HMO

## 2023-06-03 VITALS — BP 120/42 | HR 58 | Temp 98.9°F | Resp 18 | Ht 67.0 in

## 2023-06-03 DIAGNOSIS — N183 Chronic kidney disease, stage 3 unspecified: Secondary | ICD-10-CM | POA: Diagnosis not present

## 2023-06-03 DIAGNOSIS — R600 Localized edema: Secondary | ICD-10-CM | POA: Diagnosis not present

## 2023-06-03 DIAGNOSIS — C9110 Chronic lymphocytic leukemia of B-cell type not having achieved remission: Secondary | ICD-10-CM | POA: Insufficient documentation

## 2023-06-03 DIAGNOSIS — R634 Abnormal weight loss: Secondary | ICD-10-CM | POA: Insufficient documentation

## 2023-06-03 DIAGNOSIS — D61818 Other pancytopenia: Secondary | ICD-10-CM | POA: Diagnosis not present

## 2023-06-03 LAB — COMPREHENSIVE METABOLIC PANEL WITH GFR
ALT: 6 U/L (ref 0–44)
AST: 16 U/L (ref 15–41)
Albumin: 3.8 g/dL (ref 3.5–5.0)
Alkaline Phosphatase: 56 U/L (ref 38–126)
Anion gap: 5 (ref 5–15)
BUN: 21 mg/dL (ref 8–23)
CO2: 32 mmol/L (ref 22–32)
Calcium: 8.2 mg/dL — ABNORMAL LOW (ref 8.9–10.3)
Chloride: 105 mmol/L (ref 98–111)
Creatinine, Ser: 1.33 mg/dL — ABNORMAL HIGH (ref 0.61–1.24)
GFR, Estimated: 51 mL/min — ABNORMAL LOW (ref >60)
Glucose, Bld: 150 mg/dL — ABNORMAL HIGH (ref 70–99)
Potassium: 4.8 mmol/L (ref 3.5–5.1)
Sodium: 142 mmol/L (ref 135–145)
Total Bilirubin: 0.6 mg/dL (ref 0.0–1.2)
Total Protein: 6.6 g/dL (ref 6.5–8.1)

## 2023-06-03 LAB — CBC WITH DIFFERENTIAL/PLATELET
Abs Immature Granulocytes: 0 10*3/uL (ref 0.00–0.07)
Basophils Absolute: 0.2 10*3/uL — ABNORMAL HIGH (ref 0.0–0.1)
Basophils Relative: 1 %
Eosinophils Absolute: 0.3 10*3/uL (ref 0.0–0.5)
Eosinophils Relative: 2 %
HCT: 30.2 % — ABNORMAL LOW (ref 39.0–52.0)
Hemoglobin: 10.3 g/dL — ABNORMAL LOW (ref 13.0–17.0)
Lymphocytes Relative: 65 %
Lymphs Abs: 9.8 10*3/uL — ABNORMAL HIGH (ref 0.7–4.0)
MCH: 30.7 pg (ref 26.0–34.0)
MCHC: 34.1 g/dL (ref 30.0–36.0)
MCV: 90.1 fL (ref 80.0–100.0)
Monocytes Absolute: 0.6 10*3/uL (ref 0.1–1.0)
Monocytes Relative: 4 %
Neutro Abs: 4.2 10*3/uL (ref 1.7–7.7)
Neutrophils Relative %: 28 %
Platelets: 94 10*3/uL — ABNORMAL LOW (ref 150–400)
RBC: 3.35 MIL/uL — ABNORMAL LOW (ref 4.22–5.81)
RDW: 14.6 % (ref 11.5–15.5)
WBC: 15.1 10*3/uL — ABNORMAL HIGH (ref 4.0–10.5)
nRBC: 0 % (ref 0.0–0.2)

## 2023-06-03 NOTE — Progress Notes (Signed)
 [facility] OFFICE PROGRESS NOTE  Patient Care Team: [name], DO as PCP - General (Family Medicine) [name], MD as Consulting Physician (Hematology and Oncology)  Assessment & Plan CLL (chronic lymphocytic leukemia) [facility]) The patient was diagnosed with CLL in 2010, Rai stage II with lymphadenopathy and splenomegaly, FISH analysis showed trisomy 12 He received ibrutinib  between April 2017 to November 2023  His last imaging from November 2023 showed no evidence of disease However, he is noted to have progressive lymphocytosis, worsening anemia and thrombocytopenia I suspect he might have cancer recurrence I recommend CT imaging of the chest, abdomen and pelvis next week to assess I will see him back in 2 weeks for further follow-up Pancytopenia, 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 The patient denies recent history of bleeding such as epistaxis, hematuria or hematochezia. He is asymptomatic from the anemia & low platelets.   As above, I plan to order imaging study to assess for cancer recurrence There is no contraindication to remain on antiplatelet agents or anticoagulants as long as the platelet is greater than 50,000.   Stage 3 chronic kidney disease, unspecified whether stage 3a or 3b CKD (HCC) He has intermittent elevated serum creatinine We discussed importance of adequate hydration and risk factor modification Bilateral leg edema He has severe lymphedema on both legs I will refer him to lymphedema clinic to assist in bandages and management Weight loss, non-intentional I am concerned about possible cancer recurrence Will order imaging as above  Orders Placed This Encounter  Procedures   CT CHEST ABDOMEN PELVIS W CONTRAST    Standing Status:   Future    Expected Date:   06/10/2023    Expiration Date:   06/02/2024    If indicated for the ordered procedure, I authorize the administration of contrast media per Radiology protocol:    Yes    Does the patient have a contrast media/X-ray dye allergy?:   No    Preferred imaging location?:   [facility]    If indicated for the ordered procedure, I authorize the administration of oral contrast media per Radiology protocol:   No    Reason for no oral contrast::   no need oral contrast     [name], MD  INTERVAL HISTORY: he returns for surveillance follow-up for history of CLL Since last time I saw him, he has lost a lot of weight He injured his left leg recently but his skin appears to be healing well I noticed his leg is more swollen than his baseline He is noted more anemic The patient denies any recent signs or symptoms of bleeding such as spontaneous epistaxis, hematuria or hematochezia. He has worsening lymphocytosis but denies recent infection PHYSICAL EXAMINATION: ECOG PERFORMANCE STATUS: 2 - Symptomatic, <50% confined to bed  Vitals:   06/03/23 1038  BP: (!) 120/42  Pulse: (!) 58  Resp: 18  Temp: 98.9 F (37.2 C)  SpO2: 98%   There were no vitals filed for this visit. He has profound bilateral lower extremity edema, worse compared to his baseline on exam.  No blisters or open sores on his legs Relevant data reviewed during this visit included CBC and CMP

## 2023-06-03 NOTE — Assessment & Plan Note (Addendum)
 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 The patient denies recent history of bleeding such as epistaxis, hematuria or hematochezia. He is asymptomatic from the anemia & low platelets.   As above, I plan to order imaging study to assess for cancer recurrence There is no contraindication to remain on antiplatelet agents or anticoagulants as long as the platelet is greater than 50,000.

## 2023-06-03 NOTE — Assessment & Plan Note (Addendum)
 He has severe lymphedema on both legs I will refer him to lymphedema clinic to assist in bandages and management

## 2023-06-03 NOTE — Assessment & Plan Note (Addendum)
 The patient was diagnosed with CLL in 2010, Rai stage II with lymphadenopathy and splenomegaly, FISH analysis showed trisomy 68 He received ibrutinib  between April 2017 to November 2023  His last imaging from November 2023 showed no evidence of disease However, he is noted to have progressive lymphocytosis, worsening anemia and thrombocytopenia I suspect he might have cancer recurrence I recommend CT imaging of the chest, abdomen and pelvis next week to assess I will see him back in 2 weeks for further follow-up

## 2023-06-03 NOTE — Assessment & Plan Note (Addendum)
He has intermittent elevated serum creatinine We discussed importance of adequate hydration and risk factor modification 

## 2023-06-03 NOTE — Assessment & Plan Note (Addendum)
 I am concerned about possible cancer recurrence Will order imaging as above

## 2023-06-08 ENCOUNTER — Ambulatory Visit: Attending: Hematology and Oncology | Admitting: Physical Therapy

## 2023-06-08 ENCOUNTER — Encounter: Payer: Self-pay | Admitting: Physical Therapy

## 2023-06-08 ENCOUNTER — Other Ambulatory Visit: Payer: Self-pay

## 2023-06-08 DIAGNOSIS — I890 Lymphedema, not elsewhere classified: Secondary | ICD-10-CM | POA: Insufficient documentation

## 2023-06-08 DIAGNOSIS — R600 Localized edema: Secondary | ICD-10-CM | POA: Diagnosis not present

## 2023-06-08 DIAGNOSIS — R262 Difficulty in walking, not elsewhere classified: Secondary | ICD-10-CM | POA: Insufficient documentation

## 2023-06-08 DIAGNOSIS — C9110 Chronic lymphocytic leukemia of B-cell type not having achieved remission: Secondary | ICD-10-CM | POA: Insufficient documentation

## 2023-06-08 NOTE — Therapy (Signed)
 OUTPATIENT PHYSICAL THERAPY  LOWER EXTREMITY ONCOLOGY EVALUATION  Patient Name: Michael [name] MRN: [id number] DOB:February 17, 1930, 89 y.o., male Today's Date: 06/08/2023  END OF SESSION:  PT End of Session - 06/08/23 1245     Visit Number 1    Number of Visits 25    Date for PT Re-Evaluation 08/03/23    PT Start Time 1200    PT Stop Time 1258    PT Time Calculation (min) 58 min    Activity Tolerance Patient tolerated treatment well    Behavior During Therapy WFL for tasks assessed/performed             Past Medical History:  Diagnosis Date   Anemia    Anginal pain (HCC)    upon exertion   Arthritis    "left shoulder, lower back" (10/20/2016)   CLL (chronic lymphoblastic leukemia) dx'd 2000   Edema leg    Family history of adverse reaction to anesthesia    "daughter's BP drops"    GERD (gastroesophageal reflux disease)    History of hiatal hernia    Leukemia, chronic lymphoid (HCC) 12/08/2010   Lymphocytosis    Type II diabetes mellitus (HCC)    Past Surgical History:  Procedure Laterality Date   BOWEL RESECTION N/A 11/15/2017   Procedure: SMALL BOWEL RESECTION;  Surgeon: Oza Blumenthal, MD;  Location: [facility] OR;  Service: General;  Laterality: N/A;   CATARACT EXTRACTION W/ INTRAOCULAR LENS  IMPLANT, BILATERAL Bilateral    EYE SURGERY     Cataracts (bilateral)   GROIN DISSECTION Left 11/15/2017   Procedure: GROIN EXPLORATION WITH REMOVAL OF INGUINAL HERNIA MESH;  Surgeon: Oza Blumenthal, MD;  Location: [facility] OR;  Service: General;  Laterality: Left;   INGUINAL HERNIA REPAIR Left 11/10/2017   Procedure: LEFT INGUINAL HERNIA REPAIR ERAS PATHWAY;  Surgeon: Oza Blumenthal, MD;  Location: [facility] OR;  Service: General;  Laterality: Left;   INGUINAL HERNIA REPAIR Left 11/15/2017   Procedure: LEFT INGUINAL HERNIA REPAIR WITH MESH;  Surgeon: Oza Blumenthal, MD;  Location: [facility] OR;  Service: General;  Laterality: Left;   INSERTION OF MESH Left 11/10/2017   Procedure: INSERTION  OF MESH;  Surgeon: Oza Blumenthal, MD;  Location: [facility] OR;  Service: General;  Laterality: Left;   Patient Active Problem List   Diagnosis Date Noted   Iron  deficiency anemia 02/20/2022   Acute on chronic diastolic CHF (congestive heart failure) (HCC) 02/19/2022   Maxillary fracture (HCC) 02/19/2022   Syncope and collapse 02/19/2022   Stage 3a chronic kidney disease (CKD) (HCC) 02/19/2022   Obesity (BMI 30-39.9) 02/19/2022   Superficial retinal hemorrhage of right eye 07/16/2021   Stable treated proliferative diabetic retinopathy of right eye determined by examination associated with type 2 diabetes mellitus (HCC) 10/23/2019   Proliferative diabetic retinopathy of left eye (HCC) 10/23/2019   Chronic kidney disease, stage III (moderate) (HCC) 09/12/2019   Type II diabetes mellitus (HCC)    Acute cystitis without hematuria    Malnutrition of moderate degree 12/21/2017   Hypotension 12/14/2017   Hyponatremia 12/14/2017   ARF (acute renal failure) (HCC) 12/14/2017   Pressure ulcer 11/22/2017   Ileus (HCC) 11/14/2017   S/P left inguinal hernia repair 11/10/2017   Weight loss, non-intentional 04/12/2017   Essential hypertension 10/20/2016   Paresthesias 10/19/2016   Left inguinal hernia 10/12/2016   Protein-calorie malnutrition, mild (HCC) 01/10/2016   Diarrhea due to drug 06/17/2015   Chronic left shoulder pain 06/17/2015   Bilateral leg edema 06/17/2015   Pancytopenia, acquired (HCC)  04/22/2015   Thrombocytopenia (HCC) 10/11/2014   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   CLL (chronic lymphocytic leukemia) (HCC) 12/08/2010    PCP: [name], DO  REFERRING PROVIDER:  [name], MD  REFERRING DIAG: C91.10 (ICD-10-CM) - CLL (chronic lymphocytic leukemia) (HCC) R60.0 (ICD-10-CM) - Bilateral leg edema  THERAPY DIAG:  Lymphedema, not elsewhere classified  Difficulty in walking, not elsewhere classified  Leukemia, lymphocytic, chronic (HCC)  ONSET DATE:  12/27/22  Rationale for Evaluation and Treatment: Rehabilitation  SUBJECTIVE:                                                                                                                                                                                           SUBJECTIVE STATEMENT: My legs have been swelling for years but it has gotten a lot worse since the end of last year. They started turning colors.   PERTINENT HISTORY: CLL (chronic lymphocytic leukemia) (HCC) diagnosed on 04/03/2008. 04/29/2015 - 12/01/2021 Chemotherapy  04/29/2015: Ct scan showed mild lymphadenopathy and splenomegaly 01/08/2016: response to therapy. Resolution of thoracic and abdominal adenopathy. Resolution of splenomegaly. 2. No new or progressive disease. 3. Small bowel containing left inguinal hernia and right-sided hydrocele, as before. 10/09/2016: No findings suspicious for active lymphomatous involvement. No abdominopelvic lymphadenopathy.  Spleen is normal in size. Moderate to large left inguinal/scrotal hernia containing small bowel and fat. No evidence of bowel obstruction. 04/26/2017: 1. No lymphadenopathy in the chest, abdomen, or pelvis. 11/15/2017: Large recurrent left inguinal hernia with extension of multiple small bowel loops into the inguinal canal and scrotum on the left. Proximal small bowel dilatation is noted consistent with a degree of incarceration. 11/27/2021 : 1. No lymphadenopathy. 2. No pulmonary parenchymal findings 3. No evidence of lymphoma/lymphadenopathy in the abdomen pelvis. 4. Normal spleen. 5. Short segment of small bowel enters a RIGHT inguinal hernia.. Pt to have no imaging completed on 06/11/23. Has type II diabetes.    PAIN:  Are you having pain? Yes NPRS scale: 6/10 Pain location: legs Pain orientation: Bilateral  PAIN TYPE: aching, throbbing, and tight Pain description: constant  Aggravating factors: when he first gets up, walking, sitting for a long time Relieving factors:  nothing  PRECAUTIONS: None  RED FLAGS: None   WEIGHT BEARING RESTRICTIONS: No  FALLS:  Has patient fallen in last 6 months? 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  COGNITION: Overall cognitive status: Within functional limits for tasks assessed   PALPATION: Fibrosis present in bilateral LEs  OBSERVATIONS / OTHER ASSESSMENTS: papillomas and hyperkeratosis present    Media Information  Document Information  Photos  Lymphedema  06/08/2023 12:37  Attached To:  Outpatient Rehab on 06/08/23 with Corlis Dienes, Harry Lindau, PT  Source Information  [place], Harry Lindau, PT  [facility]  Document History    POSTURE: forward head, rounded shoulders   LYMPHEDEMA ASSESSMENTS:   LOWER EXTREMITY LANDMARK RIGHT eval  At groin   30 cm proximal to suprapatella   20 cm proximal to suprapatella 59.5  10 cm proximal to suprapatella 56.1  At midpatella / popliteal crease 47  40 cm proximal to floor at lateral plantar foot 55.8  30 cm proximal to floor at lateral plantar foot 53.8  20 cm proximal to floor at lateral plantar foot 46.9  10 cm proximal to floor at lateral plantar foot 38  Circumference of ankle/heel   5 cm proximal to 1st MTP joint 31.5  Across MTP joint 29.9  Around proximal great toe 12  (Blank rows = not tested)  LOWER EXTREMITY LANDMARK LEFT eval  At groin   30 cm proximal to suprapatella   20 cm proximal  to suprapatella 57.5  10 cm proximal to suprapatella 55.9  At midpatella / popliteal crease 42.5  40 cm proximal to floor at lateral plantar foot 50.5  30 cm proximal to floor at lateral plantar foot 50  20 cm proximal to floor at lateral plantar foot 42  10 cm proximal to floor at lateral plantar foot 34.2  Circumference of ankle/heel   5 cm proximal to 1st MTP joint 30.4  Across MTP joint 30.1  Around proximal great toe 12.5  (Blank rows = not tested)  LLIS:  Flowsheet Row Outpatient Rehab from 06/08/2023 in [facility]  Lymphedema Life Impact Scale Total Score 72.06 %                                                 TREATMENT DATE:  06/08/23: education on lymphedema, anatomy and physiology of the lymphatic system, complete CDT and eventual needs for compression garments, educated about need for post op shoe   PATIENT EDUCATION:  Education details: education on lymphedema, anatomy and physiology of the lymphatic system, complete CDT and eventual needs for compression garments, educated about need for post op shoe Person educated: Patient and Child(ren) Education method: Explanation Education comprehension: verbalized understanding  HOME EXERCISE PROGRAM: Obtain post op shoe to begin bandaging  ASSESSMENT:  CLINICAL IMPRESSION: Patient is a 89 y.o. male who was seen today for physical therapy evaluation and treatment for bilateral LE lymphedema. Pt has chronic lymphocytic leukemia and has completed chemo. He reports his legs have been swollen for most of his life but since the end of last year they have worsened. He has increased lymphedema throughout bilateral LEs that is worse from the knee down. He has difficulty finding clothes and shoes that fit as well as difficulty bending his  knees due to increased swelling. Pt has hyperkeratosis and papillomas. He would benefit from skilled PT services to decrease lymphedema in bilateral LEs and progress pt  towards independent management of LE lymphedema through MLD and compression garments.    OBJECTIVE IMPAIRMENTS: decreased knowledge of condition, decreased knowledge of use of DME, difficulty walking, decreased ROM, increased edema, and pain.   ACTIVITY LIMITATIONS: bending, sitting, standing, squatting, sleeping, stairs, and transfers  PARTICIPATION LIMITATIONS: meal prep, cleaning, laundry, community activity, and yard work  PERSONAL FACTORS: Age, Fitness, Past/current experiences, and 1 comorbidity: kidney disease are also affecting patient's functional outcome.   REHAB POTENTIAL: Good  CLINICAL DECISION MAKING: Evolving/moderate complexity  EVALUATION COMPLEXITY: Moderate   GOALS: Goals reviewed with patient? Yes  SHORT TERM GOALS: Target date: 07/06/23  Pt will demonstrate a 4 cm reduction in circumferences 30 cm superior to floor at lateral foot bilaterally to decrease risk of infection. Baseline: Goal status: INITIAL  2.  Pt will report a 25% improvement in pain in bilateral LEs to allow improved comfort.  Baseline:  Goal status: INITIAL    LONG TERM GOALS: Target date: 08/03/23  Pt will demonstrate maximal circumferential reductions as evidenced by no further significant reductions in circumferences to allow pt to be measured for compression garments.  Baseline:  Goal status: INITIAL  2.  Pt will obtain appropriate compression garments for day and night for long term management of lymphedema. Baseline:  Goal status: INITIAL  3.  Pt will benefit from a trial of a compression pump for long term management of lymphedema. Baseline:  Goal status: INITIAL  4.  Pt and/or daughter will be independent in self MLD for long term management of lymphedema. Baseline:  Goal status: INITIAL   PLAN:  PT FREQUENCY: 3x/week  PT DURATION: 8 weeks  PLANNED INTERVENTIONS: 97164- PT Re-evaluation, 97110-Therapeutic exercises, 97530- Therapeutic activity, 97535- Self Care,  97140- Manual therapy, 97760- Orthotic Initial, [id number]- Orthotic/Prosthetic subsequent, Patient/Family education, Therapeutic exercises, Therapeutic activity, Neuromuscular re-education, and Self Care  PLAN FOR NEXT SESSION: begin complete CDT - start on 1 leg only until maximal reduction and garments are obtained then begin on other leg   [employer], PT 06/08/2023, 1:11 PM

## 2023-06-11 ENCOUNTER — Encounter (HOSPITAL_COMMUNITY): Payer: Self-pay | Admitting: Emergency Medicine

## 2023-06-11 ENCOUNTER — Other Ambulatory Visit: Payer: Self-pay

## 2023-06-11 ENCOUNTER — Ambulatory Visit (HOSPITAL_COMMUNITY)
Admission: RE | Admit: 2023-06-11 | Discharge: 2023-06-11 | Disposition: A | Discharge status: Home / Self Care | Source: Ambulatory Visit | Attending: Hematology and Oncology | Admitting: Hematology and Oncology

## 2023-06-11 ENCOUNTER — Emergency Department (HOSPITAL_COMMUNITY)
Admission: EM | Admit: 2023-06-11 | Discharge: 2023-06-12 | Disposition: A | Discharge status: Home / Self Care | Attending: Emergency Medicine | Admitting: Emergency Medicine

## 2023-06-11 DIAGNOSIS — S0083XA Contusion of other part of head, initial encounter: Secondary | ICD-10-CM | POA: Diagnosis not present

## 2023-06-11 DIAGNOSIS — R590 Localized enlarged lymph nodes: Secondary | ICD-10-CM | POA: Diagnosis not present

## 2023-06-11 DIAGNOSIS — Y92009 Unspecified place in unspecified non-institutional (private) residence as the place of occurrence of the external cause: Secondary | ICD-10-CM | POA: Insufficient documentation

## 2023-06-11 DIAGNOSIS — C9110 Chronic lymphocytic leukemia of B-cell type not having achieved remission: Secondary | ICD-10-CM

## 2023-06-11 DIAGNOSIS — Z794 Long term (current) use of insulin: Secondary | ICD-10-CM | POA: Diagnosis not present

## 2023-06-11 DIAGNOSIS — K8020 Calculus of gallbladder without cholecystitis without obstruction: Secondary | ICD-10-CM | POA: Diagnosis not present

## 2023-06-11 DIAGNOSIS — S0181XA Laceration without foreign body of other part of head, initial encounter: Secondary | ICD-10-CM | POA: Diagnosis not present

## 2023-06-11 DIAGNOSIS — W1830XA Fall on same level, unspecified, initial encounter: Secondary | ICD-10-CM | POA: Insufficient documentation

## 2023-06-11 DIAGNOSIS — S0993XA Unspecified injury of face, initial encounter: Secondary | ICD-10-CM | POA: Diagnosis not present

## 2023-06-11 DIAGNOSIS — N281 Cyst of kidney, acquired: Secondary | ICD-10-CM | POA: Diagnosis not present

## 2023-06-11 DIAGNOSIS — S0231XA Fracture of orbital floor, right side, initial encounter for closed fracture: Secondary | ICD-10-CM | POA: Diagnosis not present

## 2023-06-11 DIAGNOSIS — D72829 Elevated white blood cell count, unspecified: Secondary | ICD-10-CM | POA: Insufficient documentation

## 2023-06-11 DIAGNOSIS — S022XXA Fracture of nasal bones, initial encounter for closed fracture: Secondary | ICD-10-CM | POA: Diagnosis not present

## 2023-06-11 DIAGNOSIS — E119 Type 2 diabetes mellitus without complications: Secondary | ICD-10-CM | POA: Diagnosis not present

## 2023-06-11 DIAGNOSIS — I10 Essential (primary) hypertension: Secondary | ICD-10-CM | POA: Diagnosis not present

## 2023-06-11 DIAGNOSIS — R220 Localized swelling, mass and lump, head: Secondary | ICD-10-CM | POA: Diagnosis not present

## 2023-06-11 DIAGNOSIS — I6523 Occlusion and stenosis of bilateral carotid arteries: Secondary | ICD-10-CM | POA: Diagnosis not present

## 2023-06-11 MED ORDER — SODIUM CHLORIDE (PF) 0.9 % IJ SOLN
INTRAMUSCULAR | Status: AC
  Filled 2023-06-11: qty 50

## 2023-06-11 MED ORDER — IOHEXOL 300 MG/ML  SOLN
100.0000 mL | Freq: Once | INTRAMUSCULAR | Status: AC | PRN
  Administered 2023-06-11: 100 mL via INTRAVENOUS

## 2023-06-11 NOTE — ED Triage Notes (Signed)
 Patient c/o dizziness and fall. Patient report he got dizziness and fell forward in the floor. Patient sustain abrasion and laceration in his forehead and nose. Patient denies taking blood thinners.

## 2023-06-12 ENCOUNTER — Emergency Department (HOSPITAL_COMMUNITY)

## 2023-06-12 DIAGNOSIS — S022XXA Fracture of nasal bones, initial encounter for closed fracture: Secondary | ICD-10-CM | POA: Diagnosis not present

## 2023-06-12 DIAGNOSIS — I6523 Occlusion and stenosis of bilateral carotid arteries: Secondary | ICD-10-CM | POA: Diagnosis not present

## 2023-06-12 DIAGNOSIS — R220 Localized swelling, mass and lump, head: Secondary | ICD-10-CM | POA: Diagnosis not present

## 2023-06-12 DIAGNOSIS — S0231XA Fracture of orbital floor, right side, initial encounter for closed fracture: Secondary | ICD-10-CM | POA: Diagnosis not present

## 2023-06-12 LAB — COMPREHENSIVE METABOLIC PANEL WITH GFR
ALT: 9 U/L (ref 0–44)
AST: 17 U/L (ref 15–41)
Albumin: 3.3 g/dL — ABNORMAL LOW (ref 3.5–5.0)
Alkaline Phosphatase: 55 U/L (ref 38–126)
Anion gap: 6 (ref 5–15)
BUN: 18 mg/dL (ref 8–23)
CO2: 24 mmol/L (ref 22–32)
Calcium: 8.2 mg/dL — ABNORMAL LOW (ref 8.9–10.3)
Chloride: 107 mmol/L (ref 98–111)
Creatinine, Ser: 1.09 mg/dL (ref 0.61–1.24)
GFR, Estimated: >60 mL/min (ref >60)
Glucose, Bld: 183 mg/dL — ABNORMAL HIGH (ref 70–99)
Potassium: 4.5 mmol/L (ref 3.5–5.1)
Sodium: 137 mmol/L (ref 135–145)
Total Bilirubin: 0.6 mg/dL (ref 0.0–1.2)
Total Protein: 6.2 g/dL — ABNORMAL LOW (ref 6.5–8.1)

## 2023-06-12 LAB — CBC
HCT: 29.9 % — ABNORMAL LOW (ref 39.0–52.0)
Hemoglobin: 9.6 g/dL — ABNORMAL LOW (ref 13.0–17.0)
MCH: 30.9 pg (ref 26.0–34.0)
MCHC: 32.1 g/dL (ref 30.0–36.0)
MCV: 96.1 fL (ref 80.0–100.0)
Platelets: 74 10*3/uL — ABNORMAL LOW (ref 150–400)
RBC: 3.11 MIL/uL — ABNORMAL LOW (ref 4.22–5.81)
RDW: 15.3 % (ref 11.5–15.5)
WBC: 12.5 10*3/uL — ABNORMAL HIGH (ref 4.0–10.5)
nRBC: 0 % (ref 0.0–0.2)

## 2023-06-12 LAB — URINALYSIS, ROUTINE W REFLEX MICROSCOPIC
Bilirubin Urine: NEGATIVE
Glucose, UA: NEGATIVE mg/dL
Hgb urine dipstick: NEGATIVE
Ketones, ur: NEGATIVE mg/dL
Leukocytes,Ua: NEGATIVE
Nitrite: NEGATIVE
Protein, ur: NEGATIVE mg/dL
Specific Gravity, Urine: 1.026 (ref 1.005–1.030)
pH: 6 (ref 5.0–8.0)

## 2023-06-12 NOTE — ED Provider Notes (Signed)
 [facility] AT [facility] Provider Note  CSN: [id number] Arrival date & time: 06/11/23 2333  Chief Complaint(s) Fall  HPI Donna Silverman is a 89 y.o. male who presented to the emergency department after a fall at home.  He reports trying to hang his coat in the bathroom hanger.  Due to chronic shoulder pain and decreased range of motion of the shoulders, he was having difficulty.  Reports increased pain on the shoulders and feeling lightheaded.  Patient lost his balance and tried to support himself with the open door which gave causing him to fall forward and hitting his face on the door.  He denies any loss of consciousness.  Patient is not anticoagulated.  He endorses facial pain.  No headache.  No neck pain.  No new shoulder pain.  No back pain, chest pain, abdominal pain or other extremity pain.  Incident occurred approximately 7 hours prior to arrival.  Patient has been up and ambulatory since.  Has been eating and tolerating p.o. since as well.  The history is provided by the patient.    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   Lymphocytosis    Type II diabetes mellitus (HCC)    Patient Active Problem List   Diagnosis Date Noted   Iron  deficiency anemia 02/20/2022   Acute on chronic diastolic CHF (congestive heart failure) (HCC) 02/19/2022   Maxillary fracture (HCC) 02/19/2022   Syncope and collapse 02/19/2022   Stage 3a chronic kidney disease (CKD) (HCC) 02/19/2022   Obesity (BMI 30-39.9) 02/19/2022   Superficial retinal hemorrhage of right eye 07/16/2021   Stable  treated proliferative diabetic retinopathy of right eye determined by examination associated with type 2 diabetes mellitus (HCC) 10/23/2019   Proliferative diabetic retinopathy of left eye (HCC) 10/23/2019   Chronic kidney disease, stage III (moderate) (HCC) 09/12/2019   Type II diabetes mellitus (HCC)    Acute cystitis without hematuria    Malnutrition of moderate degree 12/21/2017   Hypotension 12/14/2017   Hyponatremia 12/14/2017   ARF (acute renal failure) (HCC) 12/14/2017   Pressure ulcer 11/22/2017   Ileus (HCC) 11/14/2017   S/P left inguinal hernia repair 11/10/2017   Weight loss, non-intentional 04/12/2017   Essential hypertension 10/20/2016   Paresthesias 10/19/2016   Left inguinal hernia 10/12/2016   Protein-calorie malnutrition, mild (HCC) 01/10/2016   Diarrhea due to drug 06/17/2015   Chronic left shoulder pain 06/17/2015   Bilateral leg edema 06/17/2015   Pancytopenia, acquired (HCC) 04/22/2015   Thrombocytopenia (HCC) 10/11/2014   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 Authorizing Provider  acetaminophen  (TYLENOL ) 650 MG CR tablet Take 1 tablet (650 mg total) by mouth 2 (two) times daily as needed for pain. Patient taking differently: Take 650 mg by mouth 2 (two) times daily. 12/22/17   Lavaughn Portland, MD  Cholecalciferol  (VITAMIN D3) 1000 units CAPS Take 1,000 Units by mouth 2 (two)  times daily.     [provider]  Cyanocobalamin  (VITAMIN B 12 PO) Take 1,000 mcg by mouth daily.    [provider]  insulin  NPH-regular Human (70-30) 100 UNIT/ML injection Inject 35 Units into the skin 2 (two) times daily.    [provider]  mirtazapine  (REMERON ) 15 MG tablet Take 1 tablet (15 mg total) by mouth at bedtime. 12/22/17   Lavaughn Portland, MD  morphine  (MSIR) 15 MG tablet Take 15 mg by mouth 2  (two) times daily. 01/13/22   [provider]  pantoprazole  (PROTONIX ) 40 MG tablet Take 40 mg by mouth daily. 10/22/19   [provider]  polyethylene glycol (MIRALAX  / GLYCOLAX ) packet Take 17 g by mouth daily. 12/23/17   Lavaughn Portland, MD  tamsulosin  (FLOMAX ) 0.4 MG CAPS capsule Take 0.4 mg by mouth daily.    [provider]  torsemide  (DEMADEX ) 20 MG tablet Take 20 mg by mouth daily. 12/11/19   [provider]  insulin  aspart protamine-insulin  aspart (NOVOLOG  70/30) (70-30) 100 UNIT/ML injection Inject 35 Units into the skin 2 (two) times daily with a meal.   04/07/11  [provider]                                                                                                                                    Allergies Patient has no known allergies.  Review of Systems Review of Systems As noted in HPI  Physical Exam Vital Signs  I have reviewed the triage vital signs BP (!) 147/58   Pulse 63   Temp 98.9 F (37.2 C)   Resp 18   Ht 5\' 7"  (1.702 m)   Wt 99.8 kg   SpO2 98%   BMI 34.46 kg/m   Physical Exam Constitutional:      General: He is not in acute distress.    Appearance: He is well-developed. He is not diaphoretic.  HENT:     Head: Normocephalic. Contusion and laceration present. No raccoon eyes or Battle's sign.      Right Ear: External ear normal.     Left Ear: External ear normal.  Eyes:     General: No scleral icterus.       Right eye: No discharge.        Left eye: No discharge.     Conjunctiva/sclera: Conjunctivae normal.     Pupils: Pupils are equal, round, and reactive to light.  Cardiovascular:     Rate and Rhythm: Regular rhythm.     Pulses:          Radial pulses are 2+ on the right side and 2+ on the left side.       Dorsalis pedis pulses are 2+ on the right side and 2+ on the left side.     Heart sounds: Normal heart sounds. No murmur heard.    No friction rub. No gallop.  Pulmonary:  Effort:  Pulmonary effort is normal. No respiratory distress.     Breath sounds: Normal breath sounds. No stridor.  Abdominal:     General: There is no distension.     Palpations: Abdomen is soft.     Tenderness: There is no abdominal tenderness.  Musculoskeletal:     Cervical back: Normal range of motion and neck supple. No bony tenderness.     Thoracic back: No bony tenderness.     Lumbar back: No bony tenderness.     Comments: Clavicle stable. Chest stable to AP/Lat compression. Pelvis stable to Lat compression. No obvious extremity deformity. No chest or abdominal wall contusion.  Skin:    General: Skin is warm.  Neurological:     Mental Status: He is alert and oriented to person, place, and time.     GCS: GCS eye subscore is 4. GCS verbal subscore is 5. GCS motor subscore is 6.     Comments: Moving all extremities      ED Results and Treatments Labs (all labs ordered are listed, but only abnormal results are displayed) Labs Reviewed  COMPREHENSIVE METABOLIC PANEL WITH GFR - Abnormal; Notable for the following components:      Result Value   Glucose, Bld 183 (*)    Calcium  8.2 (*)    Total Protein 6.2 (*)    Albumin 3.3 (*)    All other components within normal limits  CBC - Abnormal; Notable for the following components:   WBC 12.5 (*)    RBC 3.11 (*)    Hemoglobin 9.6 (*)    HCT 29.9 (*)    Platelets 74 (*)    All other components within normal limits  URINALYSIS, ROUTINE W REFLEX MICROSCOPIC  CBG MONITORING, ED                                                                                                                         EKG  EKG Interpretation Date/Time:  Friday Jun 11 2023 23:55:27 EDT Ventricular Rate:  55 PR Interval:  171 QRS Duration:  83 QT Interval:  431 QTC Calculation: 413 R Axis:   31  Text Interpretation: Sinus rhythm Low voltage, precordial leads Consider anterior infarct Confirmed by Townsend Freud [phone]) on 06/12/2023 2:03:23 AM        Radiology CT Head Wo Contrast Result Date: 06/12/2023 CLINICAL DATA:  Fall injury with facial and frontal head impact. EXAM: CT HEAD WITHOUT CONTRAST CT MAXILLOFACIAL WITHOUT CONTRAST CT CERVICAL SPINE WITHOUT CONTRAST TECHNIQUE: Multidetector CT imaging of the head, cervical spine, and maxillofacial structures were performed using the standard protocol without intravenous contrast. Multiplanar CT image reconstructions of the cervical spine and maxillofacial structures were also generated. RADIATION DOSE REDUCTION: This exam was performed according to the departmental dose-optimization program which includes automated exposure control, adjustment of the mA and/or kV according to patient size and/or use of iterative reconstruction technique. COMPARISON:  Head CT, face CT and cervical spine CT from new 02/18/2022  FINDINGS: CT HEAD FINDINGS Brain: Again are mild-to-moderate global atrophy, atrophic ventriculomegaly mild small vessel disease of the cerebral white matter. No infarct, hemorrhage or mass are seen. There is no midline shift. Basal cisterns are clear. Vascular: Scattered calcific plaques both siphons. No hyperdense central vessels. Skull: Negative for fractures or focal lesions. There is mild swelling in the left frontal scalp. Other: None. CT MAXILLOFACIAL FINDINGS Osseous: All upper teeth have been removed since the prior study. There is multifocal carious tooth decay of the mandibular teeth. Follow-up with a dentist is recommended. This is most severe involving the left mandibular 6 year molar. There is a new mildly depressed fracture of the anterior aspect of the nasal bone. No other facial fracture is seen and no mandibular fracture or dislocation. No suspicious bone lesion is seen. Orbits: There is a mild chronic depressed right orbital floor fracture deformity, unchanged. There is no acute orbital fracture. The orbital contents are unremarkable apart from old lens replacements. Sinuses: Mild  scattered opacity noted in the ethmoid air cells. Other paranasal sinuses are clear. The nasal septum deviates to the right. The septum is intact. The nasal turbinates are unremarkable. There is mild membrane thickening in the nasal passages. Both of the ostiomeatal complexes are patent. Soft tissues: There is slight swelling over the nose. No facial hematoma or mass. CT CERVICAL SPINE FINDINGS Alignment: Straightened, with chronic trace degenerative anterolisthesis at C4-5 and C7-T1, chronic narrowing and spurring of the anterior atlantodental joint. No traumatic or further malalignment. Skull base and vertebrae: No acute fracture is evident. No primary bone lesion or focal pathologic process. Bones are diffusely dense as before. Soft tissues and spinal canal: No prevertebral fluid or swelling. No visible canal hematoma. Nuchal ligament dense calcifications are again noted at C6 and C7. There are mild calcific plaques in both proximal cervical ICAs. No thyroid  or laryngeal mass. Disc levels: Moderate disc space loss and bidirectional osteophytes are again noted C3-4, C5-6 C6-7. Posterior disc osteophyte complexes are present at these levels but do not compress the cord. No herniated discs or cord compromise are seen. There is multilevel facet uncinate joint hypertrophy, multilevel acquired foraminal stenosis greatest at C3-4 and C5-6. Upper chest: Negative. Other: None. IMPRESSION: 1. No acute intracranial CT findings or depressed skull fractures. 2. Mild left frontal scalp swelling. 3. New mildly depressed fracture of the anterior aspect of the nasal bone with overlying swelling. 4. Chronic right orbital floor fracture. 5. Multifocal carious tooth decay of the mandibular teeth. Follow-up with a dentist is recommended. 6. Straightened cervical lordosis with chronic trace degenerative anterolisthesis at C4-5 and C7-T1. No traumatic or further malalignment. No evidence of cervical fractures. 7. Multilevel  degenerative disc and joint disease with multilevel acquired cervical foraminal stenosis. 8. Carotid atherosclerosis. Aortic Atherosclerosis (ICD10-I70.0). Electronically Signed   By: Denman Fischer M.D.   On: 06/12/2023 01:59   CT Cervical Spine Wo Contrast Result Date: 06/12/2023 CLINICAL DATA:  Fall injury with facial and frontal head impact. EXAM: CT HEAD WITHOUT CONTRAST CT MAXILLOFACIAL WITHOUT CONTRAST CT CERVICAL SPINE WITHOUT CONTRAST TECHNIQUE: Multidetector CT imaging of the head, cervical spine, and maxillofacial structures were performed using the standard protocol without intravenous contrast. Multiplanar CT image reconstructions of the cervical spine and maxillofacial structures were also generated. RADIATION DOSE REDUCTION: This exam was performed according to the departmental dose-optimization program which includes automated exposure control, adjustment of the mA and/or kV according to patient size and/or use of iterative reconstruction technique. COMPARISON:  Head CT,  face CT and cervical spine CT from new 02/18/2022 FINDINGS: CT HEAD FINDINGS Brain: Again are mild-to-moderate global atrophy, atrophic ventriculomegaly mild small vessel disease of the cerebral white matter. No infarct, hemorrhage or mass are seen. There is no midline shift. Basal cisterns are clear. Vascular: Scattered calcific plaques both siphons. No hyperdense central vessels. Skull: Negative for fractures or focal lesions. There is mild swelling in the left frontal scalp. Other: None. CT MAXILLOFACIAL FINDINGS Osseous: All upper teeth have been removed since the prior study. There is multifocal carious tooth decay of the mandibular teeth. Follow-up with a dentist is recommended. This is most severe involving the left mandibular 6 year molar. There is a new mildly depressed fracture of the anterior aspect of the nasal bone. No other facial fracture is seen and no mandibular fracture or dislocation. No suspicious bone lesion  is seen. Orbits: There is a mild chronic depressed right orbital floor fracture deformity, unchanged. There is no acute orbital fracture. The orbital contents are unremarkable apart from old lens replacements. Sinuses: Mild scattered opacity noted in the ethmoid air cells. Other paranasal sinuses are clear. The nasal septum deviates to the right. The septum is intact. The nasal turbinates are unremarkable. There is mild membrane thickening in the nasal passages. Both of the ostiomeatal complexes are patent. Soft tissues: There is slight swelling over the nose. No facial hematoma or mass. CT CERVICAL SPINE FINDINGS Alignment: Straightened, with chronic trace degenerative anterolisthesis at C4-5 and C7-T1, chronic narrowing and spurring of the anterior atlantodental joint. No traumatic or further malalignment. Skull base and vertebrae: No acute fracture is evident. No primary bone lesion or focal pathologic process. Bones are diffusely dense as before. Soft tissues and spinal canal: No prevertebral fluid or swelling. No visible canal hematoma. Nuchal ligament dense calcifications are again noted at C6 and C7. There are mild calcific plaques in both proximal cervical ICAs. No thyroid  or laryngeal mass. Disc levels: Moderate disc space loss and bidirectional osteophytes are again noted C3-4, C5-6 C6-7. Posterior disc osteophyte complexes are present at these levels but do not compress the cord. No herniated discs or cord compromise are seen. There is multilevel facet uncinate joint hypertrophy, multilevel acquired foraminal stenosis greatest at C3-4 and C5-6. Upper chest: Negative. Other: None. IMPRESSION: 1. No acute intracranial CT findings or depressed skull fractures. 2. Mild left frontal scalp swelling. 3. New mildly depressed fracture of the anterior aspect of the nasal bone with overlying swelling. 4. Chronic right orbital floor fracture. 5. Multifocal carious tooth decay of the mandibular teeth. Follow-up with a  dentist is recommended. 6. Straightened cervical lordosis with chronic trace degenerative anterolisthesis at C4-5 and C7-T1. No traumatic or further malalignment. No evidence of cervical fractures. 7. Multilevel degenerative disc and joint disease with multilevel acquired cervical foraminal stenosis. 8. Carotid atherosclerosis. Aortic Atherosclerosis (ICD10-I70.0). Electronically Signed   By: Denman Fischer M.D.   On: 06/12/2023 01:59   CT Maxillofacial Wo Contrast Result Date: 06/12/2023 CLINICAL DATA:  Fall injury with facial and frontal head impact. EXAM: CT HEAD WITHOUT CONTRAST CT MAXILLOFACIAL WITHOUT CONTRAST CT CERVICAL SPINE WITHOUT CONTRAST TECHNIQUE: Multidetector CT imaging of the head, cervical spine, and maxillofacial structures were performed using the standard protocol without intravenous contrast. Multiplanar CT image reconstructions of the cervical spine and maxillofacial structures were also generated. RADIATION DOSE REDUCTION: This exam was performed according to the departmental dose-optimization program which includes automated exposure control, adjustment of the mA and/or kV according to patient size and/or use  of iterative reconstruction technique. COMPARISON:  Head CT, face CT and cervical spine CT from new 02/18/2022 FINDINGS: CT HEAD FINDINGS Brain: Again are mild-to-moderate global atrophy, atrophic ventriculomegaly mild small vessel disease of the cerebral white matter. No infarct, hemorrhage or mass are seen. There is no midline shift. Basal cisterns are clear. Vascular: Scattered calcific plaques both siphons. No hyperdense central vessels. Skull: Negative for fractures or focal lesions. There is mild swelling in the left frontal scalp. Other: None. CT MAXILLOFACIAL FINDINGS Osseous: All upper teeth have been removed since the prior study. There is multifocal carious tooth decay of the mandibular teeth. Follow-up with a dentist is recommended. This is most severe involving the left  mandibular 6 year molar. There is a new mildly depressed fracture of the anterior aspect of the nasal bone. No other facial fracture is seen and no mandibular fracture or dislocation. No suspicious bone lesion is seen. Orbits: There is a mild chronic depressed right orbital floor fracture deformity, unchanged. There is no acute orbital fracture. The orbital contents are unremarkable apart from old lens replacements. Sinuses: Mild scattered opacity noted in the ethmoid air cells. Other paranasal sinuses are clear. The nasal septum deviates to the right. The septum is intact. The nasal turbinates are unremarkable. There is mild membrane thickening in the nasal passages. Both of the ostiomeatal complexes are patent. Soft tissues: There is slight swelling over the nose. No facial hematoma or mass. CT CERVICAL SPINE FINDINGS Alignment: Straightened, with chronic trace degenerative anterolisthesis at C4-5 and C7-T1, chronic narrowing and spurring of the anterior atlantodental joint. No traumatic or further malalignment. Skull base and vertebrae: No acute fracture is evident. No primary bone lesion or focal pathologic process. Bones are diffusely dense as before. Soft tissues and spinal canal: No prevertebral fluid or swelling. No visible canal hematoma. Nuchal ligament dense calcifications are again noted at C6 and C7. There are mild calcific plaques in both proximal cervical ICAs. No thyroid  or laryngeal mass. Disc levels: Moderate disc space loss and bidirectional osteophytes are again noted C3-4, C5-6 C6-7. Posterior disc osteophyte complexes are present at these levels but do not compress the cord. No herniated discs or cord compromise are seen. There is multilevel facet uncinate joint hypertrophy, multilevel acquired foraminal stenosis greatest at C3-4 and C5-6. Upper chest: Negative. Other: None. IMPRESSION: 1. No acute intracranial CT findings or depressed skull fractures. 2. Mild left frontal scalp swelling. 3.  New mildly depressed fracture of the anterior aspect of the nasal bone with overlying swelling. 4. Chronic right orbital floor fracture. 5. Multifocal carious tooth decay of the mandibular teeth. Follow-up with a dentist is recommended. 6. Straightened cervical lordosis with chronic trace degenerative anterolisthesis at C4-5 and C7-T1. No traumatic or further malalignment. No evidence of cervical fractures. 7. Multilevel degenerative disc and joint disease with multilevel acquired cervical foraminal stenosis. 8. Carotid atherosclerosis. Aortic Atherosclerosis (ICD10-I70.0). Electronically Signed   By: Denman Fischer M.D.   On: 06/12/2023 01:59     Medications Ordered in ED Medications - No data to display Procedures Procedures  (including critical care time) Medical Decision Making / ED Course   Medical Decision Making Amount and/or Complexity of Data Reviewed Labs: ordered. Decision-making details documented in ED Course. Radiology: ordered and independent interpretation performed. Decision-making details documented in ED Course. ECG/medicine tests: ordered and independent interpretation performed. Decision-making details documented in ED Course.    Fall at home. Resulting in facial trauma.  CT head, face and cervical spine notable for mild  nasal fracture.  Laceration above was previously cleaned.  Steri-Strip applied.  Labs with mild leukocytosis.  Improved from prior.  Hemoglobin stable.  No electrolyte derangements or renal sufficiency.  EKG without acute ischemic changes, dysrhythmias or blocks.    Final Clinical Impression(s) / ED Diagnoses Final diagnoses:  Contusion of face, initial encounter  Facial laceration, initial encounter   The patient appears reasonably screened and/or stabilized for discharge and I doubt any other medical condition or other [facility] requiring further screening, evaluation, or treatment in the ED at this time. I have discussed the findings, Dx and Tx plan  with the patient/family who expressed understanding and agree(s) with the plan. Discharge instructions discussed at length. The patient/family was given strict return precautions who verbalized understanding of the instructions. No further questions at time of discharge.  Disposition: Discharge  Condition: Good  ED Discharge Orders     None        Follow Up: [name], DO [address] [place] [address] [phone]  Call  to schedule an appointment for close follow up    This chart was dictated using voice recognition software.  Despite best efforts to proofread,  errors can occur which can change the documentation meaning.    Lindle Rhea, MD 06/12/23 [phone]

## 2023-06-14 ENCOUNTER — Ambulatory Visit

## 2023-06-14 DIAGNOSIS — R262 Difficulty in walking, not elsewhere classified: Secondary | ICD-10-CM

## 2023-06-14 DIAGNOSIS — C9110 Chronic lymphocytic leukemia of B-cell type not having achieved remission: Secondary | ICD-10-CM

## 2023-06-14 DIAGNOSIS — I890 Lymphedema, not elsewhere classified: Secondary | ICD-10-CM

## 2023-06-14 LAB — GLUCOSE, CAPILLARY: Glucose-Capillary: 172 mg/dL — ABNORMAL HIGH (ref 70–99)

## 2023-06-14 NOTE — Therapy (Signed)
 OUTPATIENT PHYSICAL THERAPY  LOWER EXTREMITY ONCOLOGY TREATMENT  Patient Name: Michael [name] MRN: [id number] DOB:12/01/1930, 89 y.o., male Today's Date: 06/14/2023  END OF SESSION:  PT End of Session - 06/14/23 1317     Visit Number 2    Number of Visits 25    Date for PT Re-Evaluation 08/03/23    PT Start Time 1207    PT Stop Time 1317    PT Time Calculation (min) 70 min    Activity Tolerance Patient tolerated treatment well    Behavior During Therapy [facility] for tasks assessed/performed             Past Medical History:  Diagnosis Date   Anemia    Anginal pain (HCC)    upon exertion   Arthritis    "left shoulder, lower back" (10/20/2016)   CLL (chronic lymphoblastic leukemia) dx'd 2000   Edema leg    Family history of adverse reaction to anesthesia    "daughter's BP drops"    GERD (gastroesophageal reflux disease)    History of hiatal hernia    Leukemia, chronic lymphoid (HCC) 12/08/2010   Lymphocytosis    Type II diabetes mellitus (HCC)    Past Surgical History:  Procedure Laterality Date   BOWEL RESECTION N/A 11/15/2017   Procedure: SMALL BOWEL RESECTION;  Surgeon: Oza Blumenthal, MD;  Location: [facility] OR;  Service: General;  Laterality: N/A;   CATARACT EXTRACTION W/ INTRAOCULAR LENS  IMPLANT, BILATERAL Bilateral    EYE SURGERY     Cataracts (bilateral)   GROIN DISSECTION Left 11/15/2017   Procedure: GROIN EXPLORATION WITH REMOVAL OF INGUINAL HERNIA MESH;  Surgeon: Oza Blumenthal, MD;  Location: [facility] OR;  Service: General;  Laterality: Left;   INGUINAL HERNIA REPAIR Left 11/10/2017   Procedure: LEFT INGUINAL HERNIA REPAIR ERAS PATHWAY;  Surgeon: Oza Blumenthal, MD;  Location: [facility] OR;  Service: General;  Laterality: Left;   INGUINAL HERNIA REPAIR Left 11/15/2017   Procedure: LEFT INGUINAL HERNIA REPAIR WITH MESH;  Surgeon: Oza Blumenthal, MD;  Location: [facility] OR;  Service: General;  Laterality: Left;   INSERTION OF MESH Left 11/10/2017   Procedure: INSERTION  OF MESH;  Surgeon: Oza Blumenthal, MD;  Location: [facility] OR;  Service: General;  Laterality: Left;   Patient Active Problem List   Diagnosis Date Noted   Iron  deficiency anemia 02/20/2022   Acute on chronic diastolic CHF (congestive heart failure) (HCC) 02/19/2022   Maxillary fracture (HCC) 02/19/2022   Syncope and collapse 02/19/2022   Stage 3a chronic kidney disease (CKD) (HCC) 02/19/2022   Obesity (BMI 30-39.9) 02/19/2022   Superficial retinal hemorrhage of right eye 07/16/2021   Stable treated proliferative diabetic retinopathy of right eye determined by examination associated with type 2 diabetes mellitus (HCC) 10/23/2019   Proliferative diabetic retinopathy of left eye (HCC) 10/23/2019   Chronic kidney disease, stage III (moderate) (HCC) 09/12/2019   Type II diabetes mellitus (HCC)    Acute cystitis without hematuria    Malnutrition of moderate degree 12/21/2017   Hypotension 12/14/2017   Hyponatremia 12/14/2017   ARF (acute renal failure) (HCC) 12/14/2017   Pressure ulcer 11/22/2017   Ileus (HCC) 11/14/2017   S/P left inguinal hernia repair 11/10/2017   Weight loss, non-intentional 04/12/2017   Essential hypertension 10/20/2016   Paresthesias 10/19/2016   Left inguinal hernia 10/12/2016   Protein-calorie malnutrition, mild (HCC) 01/10/2016   Diarrhea due to drug 06/17/2015   Chronic left shoulder pain 06/17/2015   Bilateral leg edema 06/17/2015   Pancytopenia, acquired (HCC)  04/22/2015   Thrombocytopenia (HCC) 10/11/2014   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   CLL (chronic lymphocytic leukemia) (HCC) 12/08/2010    PCP: [name], DO  REFERRING PROVIDER:  [name], MD  REFERRING DIAG: C91.10 (ICD-10-CM) - CLL (chronic lymphocytic leukemia) (HCC) R60.0 (ICD-10-CM) - Bilateral leg edema  THERAPY DIAG:  Lymphedema, not elsewhere classified  Difficulty in walking, not elsewhere classified  Leukemia, lymphocytic, chronic (HCC)  ONSET DATE:  12/27/22  Rationale for Evaluation and Treatment: Rehabilitation  SUBJECTIVE:                                                                                                                                                                                           SUBJECTIVE STATEMENT: I want to start treatment on the Rt leg. We got the show Blaire told us  about last time to wear over the bandages.   PERTINENT HISTORY: CLL (chronic lymphocytic leukemia) (HCC) diagnosed on 04/03/2008. 04/29/2015 - 12/01/2021 Chemotherapy  04/29/2015: Ct scan showed mild lymphadenopathy and splenomegaly 01/08/2016: response to therapy. Resolution of thoracic and abdominal adenopathy. Resolution of splenomegaly. 2. No new or progressive disease. 3. Small bowel containing left inguinal hernia and right-sided hydrocele, as before. 10/09/2016: No findings suspicious for active lymphomatous involvement. No abdominopelvic lymphadenopathy.  Spleen is normal in size. Moderate to large left inguinal/scrotal hernia containing small bowel and fat. No evidence of bowel obstruction. 04/26/2017: 1. No lymphadenopathy in the chest, abdomen, or pelvis. 11/15/2017: Large recurrent left inguinal hernia with extension of multiple small bowel loops into the inguinal canal and scrotum on the left. Proximal small bowel dilatation is noted consistent with a degree of incarceration. 11/27/2021 : 1. No lymphadenopathy. 2. No pulmonary parenchymal findings 3. No evidence of lymphoma/lymphadenopathy in the abdomen pelvis. 4. Normal spleen. 5. Short segment of small bowel enters a RIGHT inguinal hernia.. Pt to have no imaging completed on 06/11/23. Has type II diabetes.    PAIN:  Are you having pain? Yes NPRS scale: 6/10 Pain location: legs Pain orientation: Bilateral  PAIN TYPE: aching, throbbing, and tight Pain description: constant  Aggravating factors: when he first gets up, walking, sitting for a long time, weight of legs increases pressure to calves  with prolonged sitting  Relieving factors: nothing  PRECAUTIONS: None  RED FLAGS: None   WEIGHT BEARING RESTRICTIONS: No  FALLS:  Has patient fallen in last 6 months? 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  COGNITION: Overall cognitive status: Within functional limits for tasks assessed   PALPATION: Fibrosis present in bilateral LEs  OBSERVATIONS / OTHER ASSESSMENTS: papillomas and hyperkeratosis present    Media Information  Document Information  Photos  Lymphedema  06/08/2023 12:37  Attached To:  Outpatient Rehab on 06/08/23 with Corlis Dienes, Harry Lindau, PT  Source Information  Washburn, Harry Lindau, PT  [facility]  Document History    POSTURE: forward head, rounded shoulders   LYMPHEDEMA ASSESSMENTS:   LOWER EXTREMITY LANDMARK RIGHT eval  At groin   30 cm proximal to suprapatella   20 cm proximal to suprapatella 59.5  10 cm proximal to suprapatella 56.1  At midpatella / popliteal crease 47  40 cm proximal to floor at lateral plantar foot 55.8  30 cm proximal to floor at lateral plantar foot 53.8  20 cm proximal to floor at lateral plantar foot 46.9  10 cm proximal to floor at lateral plantar foot 38  Circumference of ankle/heel   5 cm proximal to 1st MTP joint 31.5  Across MTP joint 29.9  Around proximal great toe 12  (Blank rows = not tested)  LOWER EXTREMITY LANDMARK LEFT eval  At groin   30  cm proximal to suprapatella   20 cm proximal to suprapatella 57.5  10 cm proximal to suprapatella 55.9  At midpatella / popliteal crease 42.5  40 cm proximal to floor at lateral plantar foot 50.5  30 cm proximal to floor at lateral plantar foot 50  20 cm proximal to floor at lateral plantar foot 42  10 cm proximal to floor at lateral plantar foot 34.2  Circumference of ankle/heel   5 cm proximal to 1st MTP joint 30.4  Across MTP joint 30.1  Around proximal great toe 12.5  (Blank rows = not tested)  LLIS:  Flowsheet Row Outpatient Rehab from 06/08/2023 in [facility]  Lymphedema Life Impact Scale Total Score 72.06 %                                                 TREATMENT DATE:  06/14/23: Self Care At beginning of session spent time explaining to pt and his daughter Debria Fang about the anatomy of the lymphatic system and  basic principles of MLD. Also discussed pts need for looser legged pants and they are going to try [employer] after session today. Will need to limit bandaging options today due to pants legs not wide enough.  Manual Therapy MLD to Rt LE  as follows: Short neck, superficial and deep abdominals, Rt axillary and pectoral nodes, Rt inguino-axillary anastomosis, then Rt LE working from proximal to distal and retracing all steps, avoided area of hyperkeratosis as no elasticity in skin here so focused on areas of lower leg (medial and mostly lateral) where some skin stretch available, also to dorsal foot/toes then retracing all steps back to anastomosis.  Compression Bandaging: Cocoa butter applied to Rt lower leg and foot/toes, TG soft size large from foot to knee, Molelast to  toes 1-4, artiflex to foot/ankle and up lower leg for now until we can add 1/2" gray foam to lower leg, then short stretch compression bandages as follows: 1-6 cm to foot/ankle with 1/2" gray foam to dorsal foot/lateral malleolous and wagon wheel to medial malleolous, then 1-8 cm  ASH pattern, and 1-12 cm from ankle to below knee. For now pt pulled TG soft up thigh and folded top down, if this starts to bother him he knows he can fold over top of knee high bandage later. Pt and daughter were instructed to try to leave bandages on until next session but to take them off if they start to slide down.    06/08/23: education on lymphedema, anatomy and physiology of the lymphatic system, complete CDT and eventual needs for compression garments, educated about need for post op shoe   PATIENT EDUCATION:  Education details: education on lymphedema, anatomy and physiology of the lymphatic system, complete CDT and eventual needs for compression garments, educated about need for post op shoe Person educated: Patient and Child(ren) Education method: Explanation Education comprehension: verbalized understanding  HOME EXERCISE PROGRAM: Obtain post op shoe to begin bandaging  ASSESSMENT:  CLINICAL IMPRESSION: First session of CDT to Rt LE including knee high bandage. Would like to apply 1/2" gray foam to anterior and posterior lower legs but pts pant legs were not wide enough to accommodate this. They plan to go to [employer] to try to find looser sweatpants for rest of episode of care. Pts lower leg is tender to touch and he will benefit from addition of gray foam here due to this. He reports bandages were comfortable at end of session but knows he should take them off if he starts to experience any tingling that ankle pumps and marching doesn't alleviate. He was also instructed to be aware of his urinary output due to kidney disease and that he should see increase output with LE reductions. Also we can add chip pack or gray foam for pt to wear at genital area if this doesn't start to decrease with treatment. Pt verbalized understanding all and was grateful treatment has begun for this very chronic issue.    OBJECTIVE IMPAIRMENTS: decreased knowledge of condition, decreased knowledge of use  of DME, difficulty walking, decreased ROM, increased edema, and pain.   ACTIVITY LIMITATIONS: bending, sitting, standing, squatting, sleeping, stairs, and transfers  PARTICIPATION LIMITATIONS: meal prep, cleaning, laundry, community activity, and yard work  PERSONAL FACTORS: Age, Fitness, Past/current experiences, and 1 comorbidity: kidney disease are also affecting patient's functional outcome.   REHAB POTENTIAL: Good  CLINICAL DECISION MAKING: Evolving/moderate complexity  EVALUATION COMPLEXITY: Moderate   GOALS: Goals reviewed with patient? Yes  SHORT TERM GOALS: Target date: 07/06/23  Pt will demonstrate a 4 cm reduction in circumferences 30 cm superior to floor at lateral foot bilaterally to decrease risk of infection. Baseline: Goal status: INITIAL  2.  Pt will report a 25% improvement in pain in bilateral LEs to allow improved comfort.  Baseline:  Goal status: INITIAL    LONG TERM GOALS: Target date: 08/03/23  Pt will demonstrate maximal circumferential reductions as evidenced by no further significant reductions in circumferences to allow pt to be measured for compression garments.  Baseline:  Goal status: INITIAL  2.  Pt will obtain appropriate compression garments for day and night for long term management of lymphedema. Baseline:  Goal status: INITIAL  3.  Pt will benefit from a trial of a compression pump for long term management  of lymphedema. Baseline:  Goal status: INITIAL  4.  Pt and/or daughter will be independent in self MLD for long term management of lymphedema. Baseline:  Goal status: INITIAL   PLAN:  PT FREQUENCY: 3x/week  PT DURATION: 8 weeks  PLANNED INTERVENTIONS: [id number]- PT Re-evaluation, 97110-Therapeutic exercises, 97530- Therapeutic activity, 97535- Self Care, [id number]- Manual therapy, 97760- Orthotic Initial, [phone]- Orthotic/Prosthetic subsequent, Patient/Family education, Therapeutic exercises, Therapeutic activity, Neuromuscular  re-education, and Self Care  PLAN FOR NEXT SESSION: Cont complete CDT of Rt LE for now, how did pt tolerate first bandage? How was urinary output d/t kidney disease - once maximal reduction gained on Rt and garments are obtained then begin on Lt leg   Denyce Flank, PTA 06/14/2023, 1:37 PM

## 2023-06-16 ENCOUNTER — Ambulatory Visit

## 2023-06-16 DIAGNOSIS — N401 Enlarged prostate with lower urinary tract symptoms: Secondary | ICD-10-CM | POA: Diagnosis not present

## 2023-06-16 DIAGNOSIS — E113592 Type 2 diabetes mellitus with proliferative diabetic retinopathy without macular edema, left eye: Secondary | ICD-10-CM | POA: Diagnosis not present

## 2023-06-16 DIAGNOSIS — R7989 Other specified abnormal findings of blood chemistry: Secondary | ICD-10-CM | POA: Diagnosis not present

## 2023-06-16 DIAGNOSIS — I890 Lymphedema, not elsewhere classified: Secondary | ICD-10-CM

## 2023-06-16 DIAGNOSIS — C9110 Chronic lymphocytic leukemia of B-cell type not having achieved remission: Secondary | ICD-10-CM

## 2023-06-16 DIAGNOSIS — N1831 Chronic kidney disease, stage 3a: Secondary | ICD-10-CM | POA: Diagnosis not present

## 2023-06-16 DIAGNOSIS — Z125 Encounter for screening for malignant neoplasm of prostate: Secondary | ICD-10-CM | POA: Diagnosis not present

## 2023-06-16 DIAGNOSIS — E1142 Type 2 diabetes mellitus with diabetic polyneuropathy: Secondary | ICD-10-CM | POA: Diagnosis not present

## 2023-06-16 DIAGNOSIS — R262 Difficulty in walking, not elsewhere classified: Secondary | ICD-10-CM

## 2023-06-16 NOTE — Therapy (Signed)
 OUTPATIENT PHYSICAL THERAPY  LOWER EXTREMITY ONCOLOGY TREATMENT  Patient Name: Michael [name] MRN: [id number] DOB:Feb 05, 1930, 89 y.o., male Today's Date: 06/16/2023  END OF SESSION:  PT End of Session - 06/16/23 1313     Visit Number 3    Number of Visits 25    Date for PT Re-Evaluation 08/03/23    PT Start Time 1150    PT Stop Time 1305    PT Time Calculation (min) 75 min    Activity Tolerance Patient tolerated treatment well    Behavior During Therapy [facility] for tasks assessed/performed             Past Medical History:  Diagnosis Date   Anemia    Anginal pain (HCC)    upon exertion   Arthritis    "left shoulder, lower back" (10/20/2016)   CLL (chronic lymphoblastic leukemia) dx'd 2000   Edema leg    Family history of adverse reaction to anesthesia    "daughter's BP drops"    GERD (gastroesophageal reflux disease)    History of hiatal hernia    Leukemia, chronic lymphoid (HCC) 12/08/2010   Lymphocytosis    Type II diabetes mellitus (HCC)    Past Surgical History:  Procedure Laterality Date   BOWEL RESECTION N/A 11/15/2017   Procedure: SMALL BOWEL RESECTION;  Surgeon: Oza Blumenthal, MD;  Location: [facility] OR;  Service: General;  Laterality: N/A;   CATARACT EXTRACTION W/ INTRAOCULAR LENS  IMPLANT, BILATERAL Bilateral    EYE SURGERY     Cataracts (bilateral)   GROIN DISSECTION Left 11/15/2017   Procedure: GROIN EXPLORATION WITH REMOVAL OF INGUINAL HERNIA MESH;  Surgeon: Oza Blumenthal, MD;  Location: [facility] OR;  Service: General;  Laterality: Left;   INGUINAL HERNIA REPAIR Left 11/10/2017   Procedure: LEFT INGUINAL HERNIA REPAIR ERAS PATHWAY;  Surgeon: Oza Blumenthal, MD;  Location: [facility] OR;  Service: General;  Laterality: Left;   INGUINAL HERNIA REPAIR Left 11/15/2017   Procedure: LEFT INGUINAL HERNIA REPAIR WITH MESH;  Surgeon: Oza Blumenthal, MD;  Location: [facility] OR;  Service: General;  Laterality: Left;   INSERTION OF MESH Left 11/10/2017   Procedure: INSERTION  OF MESH;  Surgeon: Oza Blumenthal, MD;  Location: [facility] OR;  Service: General;  Laterality: Left;   Patient Active Problem List   Diagnosis Date Noted   Iron  deficiency anemia 02/20/2022   Acute on chronic diastolic CHF (congestive heart failure) (HCC) 02/19/2022   Maxillary fracture (HCC) 02/19/2022   Syncope and collapse 02/19/2022   Stage 3a chronic kidney disease (CKD) (HCC) 02/19/2022   Obesity (BMI 30-39.9) 02/19/2022   Superficial retinal hemorrhage of right eye 07/16/2021   Stable treated proliferative diabetic retinopathy of right eye determined by examination associated with type 2 diabetes mellitus (HCC) 10/23/2019   Proliferative diabetic retinopathy of left eye (HCC) 10/23/2019   Chronic kidney disease, stage III (moderate) (HCC) 09/12/2019   Type II diabetes mellitus (HCC)    Acute cystitis without hematuria    Malnutrition of moderate degree 12/21/2017   Hypotension 12/14/2017   Hyponatremia 12/14/2017   ARF (acute renal failure) (HCC) 12/14/2017   Pressure ulcer 11/22/2017   Ileus (HCC) 11/14/2017   S/P left inguinal hernia repair 11/10/2017   Weight loss, non-intentional 04/12/2017   Essential hypertension 10/20/2016   Paresthesias 10/19/2016   Left inguinal hernia 10/12/2016   Protein-calorie malnutrition, mild (HCC) 01/10/2016   Diarrhea due to drug 06/17/2015   Chronic left shoulder pain 06/17/2015   Bilateral leg edema 06/17/2015   Pancytopenia, acquired (HCC)  04/22/2015   Thrombocytopenia (HCC) 10/11/2014   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   CLL (chronic lymphocytic leukemia) (HCC) 12/08/2010    PCP: [name], DO  REFERRING PROVIDER:  [name], MD  REFERRING DIAG: C91.10 (ICD-10-CM) - CLL (chronic lymphocytic leukemia) (HCC) R60.0 (ICD-10-CM) - Bilateral leg edema  THERAPY DIAG:  Lymphedema, not elsewhere classified  Difficulty in walking, not elsewhere classified  Leukemia, lymphocytic, chronic (HCC)  ONSET DATE:  12/27/22  Rationale for Evaluation and Treatment: Rehabilitation  SUBJECTIVE:                                                                                                                                                                                           SUBJECTIVE STATEMENT: I kept the bandages on. I had to pull the top one back up a few times but overall it did good. It didn't bother me none.  PERTINENT HISTORY: CLL (chronic lymphocytic leukemia) (HCC) diagnosed on 04/03/2008. 04/29/2015 - 12/01/2021 Chemotherapy  04/29/2015: Ct scan showed mild lymphadenopathy and splenomegaly 01/08/2016: response to therapy. Resolution of thoracic and abdominal adenopathy. Resolution of splenomegaly. 2. No new or progressive disease. 3. Small bowel containing left inguinal hernia and right-sided hydrocele, as before. 10/09/2016: No findings suspicious for active lymphomatous involvement. No abdominopelvic lymphadenopathy.  Spleen is normal in size. Moderate to large left inguinal/scrotal hernia containing small bowel and fat. No evidence of bowel obstruction. 04/26/2017: 1. No lymphadenopathy in the chest, abdomen, or pelvis. 11/15/2017: Large recurrent left inguinal hernia with extension of multiple small bowel loops into the inguinal canal and scrotum on the left. Proximal small bowel dilatation is noted consistent with a degree of incarceration. 11/27/2021 : 1. No lymphadenopathy. 2. No pulmonary parenchymal findings 3. No evidence of lymphoma/lymphadenopathy in the abdomen pelvis. 4. Normal spleen. 5. Short segment of small bowel enters a RIGHT inguinal hernia.. Pt to have no imaging completed on 06/11/23. Has type II diabetes.    PAIN:  Are you having pain? Yes NPRS scale: 6/10 Pain location: legs Pain orientation: Bilateral  PAIN TYPE: aching, throbbing, and tight Pain description: constant  Aggravating factors: when he first gets up, walking, sitting for a long time, weight of legs increases pressure to  calves with prolonged sitting  Relieving factors: nothing  PRECAUTIONS: None  RED FLAGS: None   WEIGHT BEARING RESTRICTIONS: No  FALLS:  Has patient fallen in last 6 months? 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  COGNITION: Overall cognitive status: Within functional limits for tasks assessed   PALPATION: Fibrosis present in bilateral LEs  OBSERVATIONS / OTHER ASSESSMENTS: papillomas and hyperkeratosis present    Media Information  Document Information  Photos  Lymphedema  06/08/2023 12:37  Attached To:  Outpatient Rehab on 06/08/23 with Corlis Dienes, Harry Lindau, PT  Source Information  [place], Harry Lindau, PT  [facility]  Document History    POSTURE: forward head, rounded shoulders   LYMPHEDEMA ASSESSMENTS:   LOWER EXTREMITY LANDMARK RIGHT eval  At groin   30 cm proximal to suprapatella   20 cm proximal to suprapatella 59.5  10 cm proximal to suprapatella 56.1  At midpatella / popliteal crease 47  40 cm proximal to floor at lateral plantar foot 55.8  30 cm proximal to floor at lateral plantar foot 53.8  20 cm proximal to floor at lateral plantar foot 46.9  10 cm proximal to floor at lateral plantar foot 38  Circumference of ankle/heel   5 cm proximal to 1st MTP joint 31.5  Across MTP joint 29.9  Around proximal great toe 12  (Blank rows = not tested)  LOWER EXTREMITY LANDMARK LEFT eval  At groin    30 cm proximal to suprapatella   20 cm proximal to suprapatella 57.5  10 cm proximal to suprapatella 55.9  At midpatella / popliteal crease 42.5  40 cm proximal to floor at lateral plantar foot 50.5  30 cm proximal to floor at lateral plantar foot 50  20 cm proximal to floor at lateral plantar foot 42  10 cm proximal to floor at lateral plantar foot 34.2  Circumference of ankle/heel   5 cm proximal to 1st MTP joint 30.4  Across MTP joint 30.1  Around proximal great toe 12.5  (Blank rows = not tested)  LLIS:  Flowsheet Row Outpatient Rehab from 06/08/2023 in [facility]  Lymphedema Life Impact Scale Total Score 72.06 %                                                 TREATMENT DATE:  06/16/23: Manual Therapy Removed bandages and washed leg with soap and water assessing skin throughout which looked great.  Cut 1/2" gray foam for ant and post lower leg as pt had shorts on today so was able to add rest of bandage. MLD to Rt LE  as follows: Short neck, superficial and deep abdominals, Rt axillary and pectoral nodes, Rt inguino-axillary anastomosis, then Rt LE working from proximal to distal and retracing all steps, avoided area of hyperkeratosis as no elasticity in skin here so focused on areas of lower leg (medial and mostly lateral) where some skin stretch available, also to dorsal foot/toes then retracing all steps back to anastomosis.  Compression Bandaging: Cocoa butter applied to Rt lower leg and foot/toes, TG soft size large from foot to knee, Molelast to toes 1-4, artiflex to foot/ankle, 1/2" gray foam ant and post lower leg fixated with artiflex, then short  stretch compression bandages as follows: 1-6 cm to foot/ankle with 1/2" gray foam to dorsal foot/lateral malleolous and wagon wheel to medial malleolous, then 1-8 cm ASH pattern, and 2-12 cm from ankle to below knee (first one with heel lock). Folded TG soft over top of bandage as pt reports this ended up  sliding down his thigh.  06/14/23: Self Care At beginning of session spent time explaining to pt and his daughter Debria Fang about the anatomy of the lymphatic system and  basic principles of MLD. Also discussed pts need for looser legged pants and they are going to try [employer] after session today. Will need to limit bandaging options today due to pants legs not wide enough.  Manual Therapy MLD to Rt LE  as follows: Short neck, superficial and deep abdominals, Rt axillary and pectoral nodes, Rt inguino-axillary anastomosis, then Rt LE working from proximal to distal and retracing all steps, avoided area of hyperkeratosis as no elasticity in skin here so focused on areas of lower leg (medial and mostly lateral) where some skin stretch available, also to dorsal foot/toes then retracing all steps back to anastomosis.  Compression Bandaging: Cocoa butter applied to Rt lower leg and foot/toes, TG soft size large from foot to knee, Molelast to toes 1-4, artiflex to foot/ankle and up lower leg for now until we can add 1/2" gray foam to lower leg, then short stretch compression bandages as follows: 1-6 cm to foot/ankle with 1/2" gray foam to dorsal foot/lateral malleolous and wagon wheel to medial malleolous, then 1-8 cm ASH pattern, and 1-12 cm from ankle to below knee. For now pt pulled TG soft up thigh and folded top down, if this starts to bother him he knows he can fold over top of knee high bandage later. Pt and daughter were instructed to try to leave bandages on until next session but to take them off if they start to slide down.    06/08/23: education on lymphedema, anatomy and physiology of the lymphatic system, complete CDT and eventual needs for compression garments, educated about need for post op shoe   PATIENT EDUCATION:  Education details: education on lymphedema, anatomy and physiology of the lymphatic system, complete CDT and eventual needs for compression garments, educated about need for post op  shoe Person educated: Patient and Child(ren) Education method: Explanation Education comprehension: verbalized understanding  HOME EXERCISE PROGRAM: Obtain post op shoe to begin bandaging  ASSESSMENT:  CLINICAL IMPRESSION: Pt did great with first bandage and reports he did notice an increase in urinary output on day one. Today pt had on shorts and since he tolerated compression well was able to add rest of bandage with 1/2" lower leg foam and second 12 cm. Pt reports bandage felt heavier but was comfortable.   OBJECTIVE IMPAIRMENTS: decreased knowledge of condition, decreased knowledge of use of DME, difficulty walking, decreased ROM, increased edema, and pain.   ACTIVITY LIMITATIONS: bending, sitting, standing, squatting, sleeping, stairs, and transfers  PARTICIPATION LIMITATIONS: meal prep, cleaning, laundry, community activity, and yard work  PERSONAL FACTORS: Age, Fitness, Past/current experiences, and 1 comorbidity: kidney disease are also affecting patient's functional outcome.   REHAB POTENTIAL: Good  CLINICAL DECISION MAKING: Evolving/moderate complexity  EVALUATION COMPLEXITY: Moderate   GOALS: Goals reviewed with patient? Yes  SHORT TERM GOALS: Target date: 07/06/23  Pt will demonstrate a 4 cm reduction in circumferences 30 cm superior to floor at lateral foot bilaterally to decrease risk of infection. Baseline: Goal status: INITIAL  2.  Pt  will report a 25% improvement in pain in bilateral LEs to allow improved comfort.  Baseline:  Goal status: INITIAL    LONG TERM GOALS: Target date: 08/03/23  Pt will demonstrate maximal circumferential reductions as evidenced by no further significant reductions in circumferences to allow pt to be measured for compression garments.  Baseline:  Goal status: INITIAL  2.  Pt will obtain appropriate compression garments for day and night for long term management of lymphedema. Baseline:  Goal status: INITIAL  3.  Pt will  benefit from a trial of a compression pump for long term management of lymphedema. Baseline:  Goal status: INITIAL  4.  Pt and/or daughter will be independent in self MLD for long term management of lymphedema. Baseline:  Goal status: INITIAL   PLAN:  PT FREQUENCY: 3x/week  PT DURATION: 8 weeks  PLANNED INTERVENTIONS: 97164- PT Re-evaluation, 97110-Therapeutic exercises, 97530- Therapeutic activity, 97535- Self Care, 16109- Manual therapy, [phone]- Orthotic Initial, [phone]- Orthotic/Prosthetic subsequent, Patient/Family education, Therapeutic exercises, Therapeutic activity, Neuromuscular re-education, and Self Care  PLAN FOR NEXT SESSION: Cont complete CDT of Rt LE for now, how did pt tolerate addition of second 12 cm bandage and leg foam? How was urinary output d/t kidney disease - once maximal reduction gained on Rt and garments are obtained then begin on Lt leg   Denyce Flank, PTA 06/16/2023, 1:18 PM

## 2023-06-17 ENCOUNTER — Telehealth: Payer: Self-pay

## 2023-06-17 ENCOUNTER — Inpatient Hospital Stay: Admitting: Hematology and Oncology

## 2023-06-17 ENCOUNTER — Other Ambulatory Visit: Payer: Self-pay

## 2023-06-17 ENCOUNTER — Encounter: Payer: Self-pay | Admitting: Hematology and Oncology

## 2023-06-17 ENCOUNTER — Other Ambulatory Visit (HOSPITAL_COMMUNITY): Payer: Self-pay

## 2023-06-17 VITALS — BP 126/59 | HR 67 | Temp 98.3°F | Resp 18 | Ht 67.0 in | Wt 221.2 lb

## 2023-06-17 DIAGNOSIS — I5033 Acute on chronic diastolic (congestive) heart failure: Secondary | ICD-10-CM

## 2023-06-17 DIAGNOSIS — N183 Chronic kidney disease, stage 3 unspecified: Secondary | ICD-10-CM | POA: Diagnosis not present

## 2023-06-17 DIAGNOSIS — C9110 Chronic lymphocytic leukemia of B-cell type not having achieved remission: Secondary | ICD-10-CM

## 2023-06-17 DIAGNOSIS — R600 Localized edema: Secondary | ICD-10-CM | POA: Diagnosis not present

## 2023-06-17 DIAGNOSIS — D61818 Other pancytopenia: Secondary | ICD-10-CM | POA: Diagnosis not present

## 2023-06-17 DIAGNOSIS — R634 Abnormal weight loss: Secondary | ICD-10-CM | POA: Diagnosis not present

## 2023-06-17 MED ORDER — ACALABRUTINIB MALEATE 100 MG PO TABS
100.0000 mg | ORAL_TABLET | Freq: Two times a day (BID) | ORAL | 11 refills | Status: DC
  Filled 2023-06-22: qty 60, 30d supply, fill #0
  Filled 2023-07-16 – 2023-07-21 (×2): qty 60, 30d supply, fill #1
  Filled 2023-08-18: qty 60, 30d supply, fill #2

## 2023-06-17 MED ORDER — ACYCLOVIR 400 MG PO TABS
400.0000 mg | ORAL_TABLET | Freq: Every day | ORAL | 6 refills | Status: DC
  Filled 2023-06-17: qty 30, 30d supply, fill #0
  Filled 2023-07-20: qty 30, 30d supply, fill #1

## 2023-06-17 NOTE — Telephone Encounter (Signed)
 Oral Oncology Patient Advocate Encounter  Was successful in securing patient a $8,000.00 grant from [facility] to provide copayment coverage for Calquence.  This will keep the out of pocket expense at $0.     [employer] ID: [id number]   The billing information is as follows and has been shared with [facility].    RxBin: [id number] PCN: [id number] Member ID: [id number] Group ID: [id number] Dates of Eligibility: 05/18/23 through 05/16/24  Fund:  Chronic Lymphocytic Leukemia   Hansel Ley, CPhT Pharmacy Technician Coordinator [facility] [phone] (Ph) 06/17/2023 1:35 PM

## 2023-06-17 NOTE — Assessment & Plan Note (Addendum)
 The patient was diagnosed with CLL in 2010, Rai stage II with lymphadenopathy and splenomegaly, FISH analysis showed trisomy 94 He received ibrutinib  between April 2017 to November 2023  His last imaging from November 2023 showed no evidence of disease However, he is noted to have progressive lymphocytosis, worsening anemia and thrombocytopenia CT imaging was reviewed with the patient in comparison with his prior imaging from 2023 which show progressive splenomegaly and lymphadenopathy Due to his progressive pancytopenia, I believe that the patient needs to be started on treatment I will plan to start him on Calquence I will get pharmacist assistance for insurance prior authorization Tentatively, plan to start him on treatment on June 1 I will see him the following week for toxicity review I will start him on acyclovir for antimicrobial prophylaxis

## 2023-06-17 NOTE — Progress Notes (Signed)
 [facility] OFFICE PROGRESS NOTE  Patient Care Team: [name], DO as PCP - General (Family Medicine) [name], MD as Consulting Physician (Hematology and Oncology)  Assessment & Plan CLL (chronic lymphocytic leukemia) [facility]) The patient was diagnosed with CLL in 2010, Rai stage II with lymphadenopathy and splenomegaly, FISH analysis showed trisomy 12 He received ibrutinib  between April 2017 to November 2023  His last imaging from November 2023 showed no evidence of disease However, he is noted to have progressive lymphocytosis, worsening anemia and thrombocytopenia CT imaging was reviewed with the patient in comparison with his prior imaging from 2023 which show progressive splenomegaly and lymphadenopathy Due to his progressive pancytopenia, I believe that the patient needs to be started on treatment I will plan to start him on Calquence I will get pharmacist assistance for insurance prior authorization Tentatively, plan to start him on treatment on June 1 I will see him the following week for toxicity review I will start him on acyclovir for antimicrobial prophylaxis Bilateral leg edema He has significant bilateral lower extremity edema There is not significant lymphadenopathy in his abdominal scan to account for severe bilateral lower extremity edema He will continue physical therapy and bandages as prescribed I will order echocardiogram to rule out congestive heart failure  Orders Placed This Encounter  Procedures   ECHOCARDIOGRAM COMPLETE    Standing Status:   Future    Expected Date:   06/24/2023    Expiration Date:   06/16/2024    Where should this test be performed:   [facility]    Perflutren DEFINITY (image enhancing agent) should be administered unless hypersensitivity or allergy exist:   Administer Perflutren    Reason for exam-Echo:   Chemo  Z09     [name], MD  INTERVAL HISTORY: he returns for surveillance follow-up to review imaging  studies Since the last time I saw him, he was directed to physical therapy and rehab and he has significant lower extremity therapy and bandages He is feeling better He had accidental fall recently and went to the emergency department but did not have any major injuries Review all test results and discussed risk and benefits of starting him on Calquence  PHYSICAL EXAMINATION: ECOG PERFORMANCE STATUS: 1 - Symptomatic but completely ambulatory  Vitals:   06/17/23 1127  BP: (!) 126/59  Pulse: 67  Resp: 18  Temp: 98.3 F (36.8 C)  SpO2: 96%   Filed Weights   06/17/23 1127  Weight: 221 lb 3.2 oz (100.3 kg)    Relevant data reviewed during this visit included CBC, CMP, EKG, CT imaging of the chest, abdomen and pelvis

## 2023-06-17 NOTE — Telephone Encounter (Signed)
 Spoke with patient's daughter, Debria Fang, regarding new prescription for acyclovir sent in to the [facility]. Daughter aware that prescription should be ready for pick-up at their convenience.  Daughter also made aware that patient is getting scheduled for an echocardiogram. Patient is currently scheduled for Tuesday, June 17th at 1 PM at [facility]. Daughter aware that we are trying to get that date moved up, if possible. She will be updated if there are any changes. Also noted that patient's calquence prescription is not yet ready and is pending prior authorization by the patient's insurance company. They will be updated when the prescription is authorized. The plan is for the patient to start around June 1st. Daughter verbalized an understanding of the information. She and the patient know to callback should they have any questions or concerns.

## 2023-06-17 NOTE — Assessment & Plan Note (Addendum)
 He has significant bilateral lower extremity edema There is not significant lymphadenopathy in his abdominal scan to account for severe bilateral lower extremity edema He will continue physical therapy and bandages as prescribed I will order echocardiogram to rule out congestive heart failure

## 2023-06-17 NOTE — Telephone Encounter (Signed)
 Oral Oncology Patient Advocate Encounter  New authorization   Received notification that prior authorization for Calquence is required.   PA submitted on 06/17/23  Key [id number]  Status is pending     Hansel Ley, CPhT Oncology Pharmacy Patient Advocate  [facility]  [phone] (phone) [phone] (fax) 06/17/2023 1:27 PM

## 2023-06-18 ENCOUNTER — Telehealth: Payer: Self-pay | Admitting: Rehabilitation

## 2023-06-18 ENCOUNTER — Ambulatory Visit: Admitting: Rehabilitation

## 2023-06-18 ENCOUNTER — Other Ambulatory Visit (HOSPITAL_COMMUNITY): Payer: Self-pay

## 2023-06-18 ENCOUNTER — Encounter: Payer: Self-pay | Admitting: Rehabilitation

## 2023-06-18 DIAGNOSIS — I890 Lymphedema, not elsewhere classified: Secondary | ICD-10-CM | POA: Diagnosis not present

## 2023-06-18 DIAGNOSIS — C9110 Chronic lymphocytic leukemia of B-cell type not having achieved remission: Secondary | ICD-10-CM

## 2023-06-18 DIAGNOSIS — R262 Difficulty in walking, not elsewhere classified: Secondary | ICD-10-CM

## 2023-06-18 NOTE — Telephone Encounter (Signed)
 Called pt to see if he could move his 6/9 appt from 12pm to any other time that day due to a provider event - left voice mail.

## 2023-06-18 NOTE — Therapy (Signed)
 OUTPATIENT PHYSICAL THERAPY  LOWER EXTREMITY ONCOLOGY TREATMENT  Patient Name: Michael [name] MRN: [id number] DOB:[birth year], 89 y.o., male Today's Date: 06/18/2023  END OF SESSION:  PT End of Session - 06/18/23 1058     Visit Number 4    Number of Visits 25    Date for PT Re-Evaluation 08/03/23    PT Start Time 1102    PT Stop Time 1203    PT Time Calculation (min) 61 min    Activity Tolerance Patient tolerated treatment well    Behavior During Therapy WFL for tasks assessed/performed             Past Medical History:  Diagnosis Date   Anemia    Anginal pain (HCC)    upon exertion   Arthritis    "left shoulder, lower back" (10/20/2016)   CLL (chronic lymphoblastic leukemia) dx'd 2000   Edema leg    Family history of adverse reaction to anesthesia    "daughter's BP drops"    GERD (gastroesophageal reflux disease)    History of hiatal hernia    Leukemia, chronic lymphoid (HCC) 12/08/2010   Lymphocytosis    Type II diabetes mellitus (HCC)    Past Surgical History:  Procedure Laterality Date   BOWEL RESECTION N/A 11/15/2017   Procedure: SMALL BOWEL RESECTION;  Surgeon: Oza Blumenthal, MD;  Location: [facility] OR;  Service: General;  Laterality: N/A;   CATARACT EXTRACTION W/ INTRAOCULAR LENS  IMPLANT, BILATERAL Bilateral    EYE SURGERY     Cataracts (bilateral)   GROIN DISSECTION Left 11/15/2017   Procedure: GROIN EXPLORATION WITH REMOVAL OF INGUINAL HERNIA MESH;  Surgeon: Oza Blumenthal, MD;  Location: [facility] OR;  Service: General;  Laterality: Left;   INGUINAL HERNIA REPAIR Left 11/10/2017   Procedure: LEFT INGUINAL HERNIA REPAIR ERAS PATHWAY;  Surgeon: Oza Blumenthal, MD;  Location: [facility] OR;  Service: General;  Laterality: Left;   INGUINAL HERNIA REPAIR Left 11/15/2017   Procedure: LEFT INGUINAL HERNIA REPAIR WITH MESH;  Surgeon: Oza Blumenthal, MD;  Location: [facility] OR;  Service: General;  Laterality: Left;   INSERTION OF MESH Left 11/10/2017   Procedure: INSERTION  OF MESH;  Surgeon: Oza Blumenthal, MD;  [facility] OR;  Service: General;  Laterality: Left;   Patient Active Problem List   Diagnosis Date Noted   Iron  deficiency anemia 02/20/2022   Acute on chronic diastolic CHF (congestive heart failure) (HCC) 02/19/2022   Maxillary fracture (HCC) 02/19/2022   Syncope and collapse 02/19/2022   Stage 3a chronic kidney disease (CKD) (HCC) 02/19/2022   Obesity (BMI 30-39.9) 02/19/2022   Superficial retinal hemorrhage of right eye 07/16/2021   Stable treated proliferative diabetic retinopathy of right eye determined by examination associated with type 2 diabetes mellitus (HCC) 10/23/2019   Proliferative diabetic retinopathy of left eye (HCC) 10/23/2019   Chronic kidney disease, stage III (moderate) (HCC) 09/12/2019   Type II diabetes mellitus (HCC)    Acute cystitis without hematuria    Malnutrition of moderate degree 12/21/2017   Hypotension 12/14/2017   Hyponatremia 12/14/2017   ARF (acute renal failure) (HCC) 12/14/2017   Pressure ulcer 11/22/2017   Ileus (HCC) 11/14/2017   S/P left inguinal hernia repair 11/10/2017   Weight loss, non-intentional 04/12/2017   Essential hypertension 10/20/2016   Paresthesias 10/19/2016   Left inguinal hernia 10/12/2016   Protein-calorie malnutrition, mild (HCC) 01/10/2016   Diarrhea due to drug 06/17/2015   Chronic left shoulder pain 06/17/2015   Bilateral leg edema 06/17/2015   Pancytopenia, acquired (HCC)  04/22/2015   Thrombocytopenia (HCC) 10/11/2014   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   CLL (chronic lymphocytic leukemia) (HCC) 12/08/2010    PCP: [name], DO  REFERRING PROVIDER:  [name], MD  REFERRING DIAG: C91.10 (ICD-10-CM) - CLL (chronic lymphocytic leukemia) (HCC) R60.0 (ICD-10-CM) - Bilateral leg edema  THERAPY DIAG:  Lymphedema, not elsewhere classified  Difficulty in walking, not elsewhere classified  Leukemia, lymphocytic, chronic (HCC)  ONSET DATE:  12/27/22  Rationale for Evaluation and Treatment: Rehabilitation  SUBJECTIVE:                                                                                                                                                                                           SUBJECTIVE STATEMENT: It stayed on.    PERTINENT HISTORY: CLL (chronic lymphocytic leukemia) (HCC) diagnosed on 04/03/2008. 04/29/2015 - 12/01/2021 Chemotherapy  04/29/2015: Ct scan showed mild lymphadenopathy and splenomegaly 01/08/2016: response to therapy. Resolution of thoracic and abdominal adenopathy. Resolution of splenomegaly. 2. No new or progressive disease. 3. Small bowel containing left inguinal hernia and right-sided hydrocele, as before. 10/09/2016: No findings suspicious for active lymphomatous involvement. No abdominopelvic lymphadenopathy.  Spleen is normal in size. Moderate to large left inguinal/scrotal hernia containing small bowel and fat. No evidence of bowel obstruction. 04/26/2017: 1. No lymphadenopathy in the chest, abdomen, or pelvis. 11/15/2017: Large recurrent left inguinal hernia with extension of multiple small bowel loops into the inguinal canal and scrotum on the left. Proximal small bowel dilatation is noted consistent with a degree of incarceration. 11/27/2021 : 1. No lymphadenopathy. 2. No pulmonary parenchymal findings 3. No evidence of lymphoma/lymphadenopathy in the abdomen pelvis. 4. Normal spleen. 5. Short segment of small bowel enters a RIGHT inguinal hernia.. Pt to have no imaging completed on 06/11/23. Has type II diabetes.    PAIN:  Are you having pain? Yes NPRS scale: 6/10 Pain location: legs Pain orientation: Bilateral  PAIN TYPE: aching, throbbing, and tight Pain description: constant  Aggravating factors: when he first gets up, walking, sitting for a long time, weight of legs increases pressure to calves with prolonged sitting  Relieving factors: nothing  PRECAUTIONS: None  RED  FLAGS: None   WEIGHT BEARING RESTRICTIONS: No  FALLS:  Has patient fallen in last 6 months? 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  COGNITION: Overall cognitive status: Within functional limits for tasks assessed   PALPATION: Fibrosis present in bilateral LEs  OBSERVATIONS / OTHER ASSESSMENTS: papillomas and hyperkeratosis present    Media Information  Document Information  Photos  Lymphedema  06/08/2023 12:37  Attached To:  Outpatient Rehab on 06/08/23 with Corlis Dienes, Harry Lindau, PT  Source Information  [place], Harry Lindau, PT  [facility]  Document History    POSTURE: forward head, rounded shoulders   LYMPHEDEMA ASSESSMENTS:   LOWER EXTREMITY LANDMARK RIGHT eval 06/18/23  At groin    30 cm proximal to suprapatella    20 cm proximal to suprapatella 59.5   10 cm proximal to suprapatella 56.1 56.8  At midpatella / popliteal crease 47 48  40 cm proximal to floor at lateral plantar foot 55.8   30 cm proximal to floor at lateral plantar foot 53.8 49.3  20 cm proximal to floor at lateral plantar foot 46.9 44.9  10 cm proximal to floor at lateral plantar foot 38 34  Circumference of ankle/heel    5 cm proximal to 1st MTP joint 31.5 28.5  Across MTP joint 29.9 27.9  Around proximal great toe 12 11.0  (Blank rows = not tested)  LOWER EXTREMITY LANDMARK LEFT eval  At groin   30 cm proximal to suprapatella    20 cm proximal to suprapatella 57.5  10 cm proximal to suprapatella 55.9  At midpatella / popliteal crease 42.5  40 cm proximal to floor at lateral plantar foot 50.5  30 cm proximal to floor at lateral plantar foot 50  20 cm proximal to floor at lateral plantar foot 42  10 cm proximal to floor at lateral plantar foot 34.2  Circumference of ankle/heel   5 cm proximal to 1st MTP joint 30.4  Across MTP joint 30.1  Around proximal great toe 12.5  (Blank rows = not tested)  LLIS:  Flowsheet Row Outpatient Rehab from 06/08/2023 in [facility]  Lymphedema Life Impact Scale Total Score 72.06 %                                                 TREATMENT DATE:  06/18/23: Manual Therapy Removed bandages and washed leg with soap and water assessing skin throughout Remeasured LE  MLD to Rt LE  as follows: Short neck, superficial and deep abdominals, Rt axillary and pectoral nodes, Rt inguino-axillary anastomosis, then Rt LE working from proximal to distal and retracing all steps, avoided area of hyperkeratosis as no elasticity in skin here so focused on areas of lower leg (medial and mostly lateral) where some skin stretch available, also to dorsal foot/toes then retracing all steps back to anastomosis.  Compression Bandaging: Cocoa butter applied to Rt lower leg and foot/toes, TG soft size large from foot to knee, Molelast to toes 1-4, artiflex to foot/ankle, 1/2" gray foam ant and post lower leg fixated with artiflex, then short stretch compression bandages as follows: 1-6 cm to foot/ankle with 1/2" gray foam to dorsal foot/lateral malleolous and wagon wheel to medial malleolous, then 1-8 cm ASH pattern, and 2-12 cm from ankle to  below knee (first one with heel lock). Folded TG soft over top of bandage as pt reports this ended up sliding down his thigh.   06/16/23: Manual Therapy Removed bandages and washed leg with soap and water assessing skin throughout which looked  great.  Cut 1/2" gray foam for ant and post lower leg as pt had shorts on today so was able to add rest of bandage. MLD to Rt LE  as follows: Short neck, superficial and deep abdominals, Rt axillary and pectoral nodes, Rt inguino-axillary anastomosis, then Rt LE working from proximal to distal and retracing all steps, avoided area of hyperkeratosis as no elasticity in skin here so focused on areas of lower leg (medial and mostly lateral) where some skin stretch available, also to dorsal foot/toes then retracing all steps back to anastomosis.  Compression Bandaging: Cocoa butter applied to Rt lower leg and foot/toes, TG soft size large from foot to knee, Molelast to toes 1-4, artiflex to foot/ankle, 1/2" gray foam ant and post lower leg fixated with artiflex, then short stretch compression bandages as follows: 1-6 cm to foot/ankle with 1/2" gray foam to dorsal foot/lateral malleolous and wagon wheel to medial malleolous, then 1-8 cm ASH pattern, and 2-12 cm from ankle to below knee (first one with heel lock). Folded TG soft over top of bandage as pt reports this ended up sliding down his thigh.   06/14/23: Self Care At beginning of session spent time explaining to pt and his daughter Michael [name] about the anatomy of the lymphatic system and  basic principles of MLD. Also discussed pts need for looser legged pants and they are going to try [employer] after session today. Will need to limit bandaging options today due to pants legs not wide enough.  Manual Therapy MLD to Rt LE  as follows: Short neck, superficial and deep abdominals, Rt axillary and pectoral nodes, Rt inguino-axillary anastomosis, then Rt LE working from proximal to distal and retracing all steps, avoided area of hyperkeratosis as no elasticity in skin here so focused on areas of lower leg (medial and mostly lateral) where some skin stretch available, also to dorsal foot/toes then retracing all steps back to anastomosis.  Compression Bandaging: Cocoa  butter applied to Rt lower leg and foot/toes, TG soft size large from foot to knee, Molelast to toes 1-4, artiflex to foot/ankle and up lower leg for now until we can add 1/2" gray foam to lower leg, then short stretch compression bandages as follows: 1-6 cm to foot/ankle with 1/2" gray foam to dorsal foot/lateral malleolous and wagon wheel to medial malleolous, then 1-8 cm ASH pattern, and 1-12 cm from ankle to below knee. For now pt pulled TG soft up thigh and folded top down, if this starts to bother him he knows he can fold over top of knee high bandage later. Pt and daughter were instructed to try to leave bandages on until next session but to take them off if they start to slide down.   06/08/23: education on lymphedema, anatomy and physiology of the lymphatic system, complete CDT and eventual needs for compression garments, educated about need for post op shoe   PATIENT EDUCATION:  Education details: education on lymphedema, anatomy and physiology of the lymphatic system, complete CDT and eventual needs for compression garments, educated about need for post op shoe Person educated: Patient and Child(ren) Education method: Explanation Education comprehension: verbalized understanding  HOME EXERCISE PROGRAM: Obtain post op shoe to begin bandaging  ASSESSMENT:  CLINICAL IMPRESSION: Continued  CDT.  Pt is very happy with his progress and has reduced up to 4cm in some places.  We discussed D/C garments for the leg and pt does not think he will be able to get on a stocking and is interested in velcro.  Will wrap to see if we can get more reduction and then he may do well in a velcro day and a tribute night.    OBJECTIVE IMPAIRMENTS: decreased knowledge of condition, decreased knowledge of use of DME, difficulty walking, decreased ROM, increased edema, and pain.   ACTIVITY LIMITATIONS: bending, sitting, standing, squatting, sleeping, stairs, and transfers  PARTICIPATION LIMITATIONS: meal prep,  cleaning, laundry, community activity, and yard work  PERSONAL FACTORS: Age, Fitness, Past/current experiences, and 1 comorbidity: kidney disease are also affecting patient's functional outcome.   REHAB POTENTIAL: Good  CLINICAL DECISION MAKING: Evolving/moderate complexity  EVALUATION COMPLEXITY: Moderate   GOALS: Goals reviewed with patient? Yes  SHORT TERM GOALS: Target date: 07/06/23  Pt will demonstrate a 4 cm reduction in circumferences 30 cm superior to floor at lateral foot bilaterally to decrease risk of infection. Baseline: Goal status: INITIAL  2.  Pt will report a 25% improvement in pain in bilateral LEs to allow improved comfort.  Baseline:  Goal status: INITIAL    LONG TERM GOALS: Target date: 08/03/23  Pt will demonstrate maximal circumferential reductions as evidenced by no further significant reductions in circumferences to allow pt to be measured for compression garments.  Baseline:  Goal status: INITIAL  2.  Pt will obtain appropriate compression garments for day and night for long term management of lymphedema. Baseline:  Goal status: INITIAL  3.  Pt will benefit from a trial of a compression pump for long term management of lymphedema. Baseline:  Goal status: INITIAL  4.  Pt and/or daughter will be independent in self MLD for long term management of lymphedema. Baseline:  Goal status: INITIAL   PLAN:  PT FREQUENCY: 3x/week  PT DURATION: 8 weeks  PLANNED INTERVENTIONS: 97164- PT Re-evaluation, 97110-Therapeutic exercises, 97530- Therapeutic activity, 97535- Self Care, 16109- Manual therapy, [phone]- Orthotic Initial, [phone]- Orthotic/Prosthetic subsequent, Patient/Family education, Therapeutic exercises, Therapeutic activity, Neuromuscular re-education, and Self Care  PLAN FOR NEXT SESSION: Cont complete CDT of Rt LE for now, how did pt tolerate addition of second 12 cm bandage and leg foam? How was urinary output d/t kidney disease - once maximal  reduction gained on Rt and garments are obtained then begin on Lt leg   Amyriah Buras, Durward Gillis, PT 06/18/2023, 12:21 PM

## 2023-06-18 NOTE — Telephone Encounter (Signed)
 Oral Oncology Patient Advocate Encounter  Prior Authorization for [employer]quence has been approved.    PA# [id number]  Effective dates: 01/27/23 through 01/26/24  Patients co-pay is $1,702.50.  [employer] obtained to make co-pay $0.00.    Hansel Ley, CPhT Pharmacy Technician Coordinator [facility] [phone] (Ph) 06/18/2023 7:54 AM

## 2023-06-22 ENCOUNTER — Other Ambulatory Visit: Payer: Self-pay | Admitting: Pharmacy Technician

## 2023-06-22 ENCOUNTER — Other Ambulatory Visit (HOSPITAL_COMMUNITY): Payer: Self-pay

## 2023-06-22 ENCOUNTER — Other Ambulatory Visit: Payer: Self-pay

## 2023-06-22 DIAGNOSIS — M48061 Spinal stenosis, lumbar region without neurogenic claudication: Secondary | ICD-10-CM | POA: Diagnosis not present

## 2023-06-22 DIAGNOSIS — I129 Hypertensive chronic kidney disease with stage 1 through stage 4 chronic kidney disease, or unspecified chronic kidney disease: Secondary | ICD-10-CM | POA: Diagnosis not present

## 2023-06-22 DIAGNOSIS — E113592 Type 2 diabetes mellitus with proliferative diabetic retinopathy without macular edema, left eye: Secondary | ICD-10-CM | POA: Diagnosis not present

## 2023-06-22 DIAGNOSIS — N1831 Chronic kidney disease, stage 3a: Secondary | ICD-10-CM | POA: Diagnosis not present

## 2023-06-22 DIAGNOSIS — E1122 Type 2 diabetes mellitus with diabetic chronic kidney disease: Secondary | ICD-10-CM | POA: Diagnosis not present

## 2023-06-22 DIAGNOSIS — C9190 Lymphoid leukemia, unspecified not having achieved remission: Secondary | ICD-10-CM | POA: Diagnosis not present

## 2023-06-22 DIAGNOSIS — Z0000 Encounter for general adult medical examination without abnormal findings: Secondary | ICD-10-CM | POA: Diagnosis not present

## 2023-06-22 DIAGNOSIS — E1142 Type 2 diabetes mellitus with diabetic polyneuropathy: Secondary | ICD-10-CM | POA: Diagnosis not present

## 2023-06-22 DIAGNOSIS — N401 Enlarged prostate with lower urinary tract symptoms: Secondary | ICD-10-CM | POA: Diagnosis not present

## 2023-06-22 DIAGNOSIS — R7989 Other specified abnormal findings of blood chemistry: Secondary | ICD-10-CM | POA: Diagnosis not present

## 2023-06-22 DIAGNOSIS — Z794 Long term (current) use of insulin: Secondary | ICD-10-CM | POA: Diagnosis not present

## 2023-06-22 NOTE — Progress Notes (Unsigned)
 Specialty Pharmacy Initial Fill Coordination Note  Kelden Lavallee is a 89 y.o. male contacted today regarding refills of specialty medication(s) Acalabrutinib Maleate (CALQUENCE) .  Patient requested Delivery  on 06/24/23  to verified address [address] [place] [address]   Medication will be filled on 06/23/23.   Patient is aware of $0 copayment.  Use PDMI- [employer] grant for copay.

## 2023-06-22 NOTE — Telephone Encounter (Signed)
 Patient successfully On[employer] and drug education provided by pharmacist. Medication scheduled to be shipped on 06/23/23 for delivery on 06/24/23 from [facility] to patient's address. Patient also knows to call me at [phone] with any questions or concerns regarding receiving medication or if there is any unexpected change in co-pay.

## 2023-06-23 ENCOUNTER — Encounter: Payer: Self-pay | Admitting: Rehabilitation

## 2023-06-23 ENCOUNTER — Other Ambulatory Visit: Payer: Self-pay

## 2023-06-23 ENCOUNTER — Ambulatory Visit: Admitting: Rehabilitation

## 2023-06-23 ENCOUNTER — Telehealth: Payer: Self-pay | Admitting: Pharmacist

## 2023-06-23 DIAGNOSIS — R262 Difficulty in walking, not elsewhere classified: Secondary | ICD-10-CM

## 2023-06-23 DIAGNOSIS — C9110 Chronic lymphocytic leukemia of B-cell type not having achieved remission: Secondary | ICD-10-CM

## 2023-06-23 DIAGNOSIS — I890 Lymphedema, not elsewhere classified: Secondary | ICD-10-CM

## 2023-06-23 NOTE — Therapy (Signed)
 OUTPATIENT PHYSICAL THERAPY  LOWER EXTREMITY ONCOLOGY TREATMENT  Patient Name: Michael [name] MRN: [id number] DOB:[birth year], 89 y.o., male Today's Date: 06/23/2023  END OF SESSION:  PT End of Session - 06/23/23 1552     Visit Number 5    Number of Visits 25    Date for PT Re-Evaluation 08/03/23    PT Start Time 1100    PT Stop Time 1158    PT Time Calculation (min) 58 min    Activity Tolerance Patient tolerated treatment well    Behavior During Therapy WFL for tasks assessed/performed             Past Medical History:  Diagnosis Date   Anemia    Anginal pain (HCC)    upon exertion   Arthritis    "left shoulder, lower back" (10/20/2016)   CLL (chronic lymphoblastic leukemia) dx'd 2000   Edema leg    Family history of adverse reaction to anesthesia    "daughter's BP drops"    GERD (gastroesophageal reflux disease)    History of hiatal hernia    Leukemia, chronic lymphoid (HCC) 12/08/2010   Lymphocytosis    Type II diabetes mellitus (HCC)    Past Surgical History:  Procedure Laterality Date   BOWEL RESECTION N/A 11/15/2017   Procedure: SMALL BOWEL RESECTION;  Surgeon: Oza Blumenthal, MD;  Location: [facility] OR;  Service: General;  Laterality: N/A;   CATARACT EXTRACTION W/ INTRAOCULAR LENS  IMPLANT, BILATERAL Bilateral    EYE SURGERY     Cataracts (bilateral)   GROIN DISSECTION Left 11/15/2017   Procedure: GROIN EXPLORATION WITH REMOVAL OF INGUINAL HERNIA MESH;  Surgeon: Oza Blumenthal, MD;  Location: [facility] OR;  Service: General;  Laterality: Left;   INGUINAL HERNIA REPAIR Left 11/10/2017   Procedure: LEFT INGUINAL HERNIA REPAIR ERAS PATHWAY;  Surgeon: Oza Blumenthal, MD;  Location: [facility] OR;  Service: General;  Laterality: Left;   INGUINAL HERNIA REPAIR Left 11/15/2017   Procedure: LEFT INGUINAL HERNIA REPAIR WITH MESH;  Surgeon: Oza Blumenthal, MD;  Location: [facility] OR;  Service: General;  Laterality: Left;   INSERTION OF MESH Left 11/10/2017   Procedure: INSERTION  OF MESH;  Surgeon: Oza Blumenthal, MD;  Location: [facility] OR;  Service: General;  Laterality: Left;   Patient Active Problem List   Diagnosis Date Noted   Iron  deficiency anemia 02/20/2022   Acute on chronic diastolic CHF (congestive heart failure) (HCC) 02/19/2022   Maxillary fracture (HCC) 02/19/2022   Syncope and collapse 02/19/2022   Stage 3a chronic kidney disease (CKD) (HCC) 02/19/2022   Obesity (BMI 30-39.9) 02/19/2022   Superficial retinal hemorrhage of right eye 07/16/2021   Stable treated proliferative diabetic retinopathy of right eye determined by examination associated with type 2 diabetes mellitus (HCC) 10/23/2019   Proliferative diabetic retinopathy of left eye (HCC) 10/23/2019   Chronic kidney disease, stage III (moderate) (HCC) 09/12/2019   Type II diabetes mellitus (HCC)    Acute cystitis without hematuria    Malnutrition of moderate degree 12/21/2017   Hypotension 12/14/2017   Hyponatremia 12/14/2017   ARF (acute renal failure) (HCC) 12/14/2017   Pressure ulcer 11/22/2017   Ileus (HCC) 11/14/2017   S/P left inguinal hernia repair 11/10/2017   Weight loss, non-intentional 04/12/2017   Essential hypertension 10/20/2016   Paresthesias 10/19/2016   Left inguinal hernia 10/12/2016   Protein-calorie malnutrition, mild (HCC) 01/10/2016   Diarrhea due to drug 06/17/2015   Chronic left shoulder pain 06/17/2015   Bilateral leg edema 06/17/2015   Pancytopenia, acquired (HCC)  04/22/2015   Thrombocytopenia (HCC) 10/11/2014   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   CLL (chronic lymphocytic leukemia) (HCC) 12/08/2010    PCP: [name], DO  REFERRING PROVIDER:  [name], MD  REFERRING DIAG: C91.10 (ICD-10-CM) - CLL (chronic lymphocytic leukemia) (HCC) R60.0 (ICD-10-CM) - Bilateral leg edema  THERAPY DIAG:  Lymphedema, not elsewhere classified  Difficulty in walking, not elsewhere classified  Leukemia, lymphocytic, chronic (HCC)  ONSET DATE:  12/27/22  Rationale for Evaluation and Treatment: Rehabilitation  SUBJECTIVE:                                                                                                                                                                                           SUBJECTIVE STATEMENT: It stayed on until Monday.  They think the CLL is back. I am getting a heart echo soon.   PERTINENT HISTORY: CLL (chronic lymphocytic leukemia) (HCC) diagnosed on 04/03/2008. 04/29/2015 - 12/01/2021 Chemotherapy  04/29/2015: Ct scan showed mild lymphadenopathy and splenomegaly 01/08/2016: response to therapy.  06/11/23: Findings raise concern for possible recurrence of CLL - enlarged spleen and lymph nodes - starting new chemotherapy pill 06/27/23  PAIN:  Are you having pain? Yes NPRS scale: 6/10 Pain location: legs Pain orientation: Bilateral  PAIN TYPE: aching, throbbing, and tight Pain description: constant  Aggravating factors: when he first gets up, walking, sitting for a long time, weight of legs increases pressure to calves with prolonged sitting  Relieving factors: nothing  PRECAUTIONS: None  RED FLAGS: None   WEIGHT BEARING RESTRICTIONS: No  FALLS:  Has patient fallen in last 6 months? 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  COGNITION: Overall cognitive status: Within functional limits for tasks assessed   PALPATION: Fibrosis present in bilateral  LEs  OBSERVATIONS / OTHER ASSESSMENTS: papillomas and hyperkeratosis present    Media Information  Document Information  Photos  Lymphedema  06/08/2023 12:37  Attached To:  Outpatient Rehab on 06/08/23 with Breedlove Blue, Harry [place], PT  Source Information  [place], Harry Lindau, PT  [facility]  Document History    POSTURE:  forward head, rounded shoulders   LYMPHEDEMA ASSESSMENTS:   LOWER EXTREMITY LANDMARK RIGHT eval 06/18/23  At groin    30 cm proximal to suprapatella    20 cm proximal to suprapatella 59.5   10 cm proximal to suprapatella 56.1 56.8  At midpatella / popliteal crease 47 48  40 cm proximal to floor at lateral plantar foot 55.8   30 cm proximal to floor at lateral plantar foot 53.8 49.3  20 cm proximal to floor at lateral plantar foot 46.9 44.9  10 cm proximal to floor at lateral plantar foot 38 34  Circumference of ankle/heel    5 cm proximal to 1st MTP joint 31.5 28.5  Across MTP joint 29.9 27.9  Around proximal great toe 12 11.0  (Blank rows = not tested)  LOWER EXTREMITY LANDMARK LEFT eval  At groin   30 cm proximal to suprapatella   20 cm proximal to suprapatella 57.5  10 cm proximal to suprapatella 55.9  At midpatella / popliteal crease 42.5  40 cm proximal to floor at lateral plantar foot 50.5  30 cm proximal to floor at lateral plantar foot 50  20 cm proximal to floor at lateral plantar foot 42  10 cm proximal to floor at lateral plantar foot 34.2  Circumference of ankle/heel   5 cm proximal to 1st MTP joint 30.4  Across MTP joint 30.1  Around proximal great toe 12.5  (Blank rows = not tested)  LLIS:  Flowsheet Row Outpatient Rehab from 06/08/2023 in [facility]  Lymphedema Life Impact Scale Total Score 72.06 %                                                 TREATMENT DATE:  06/23/23: Manual Therapy Discussed getting a velcro as pt and daughter showed interest in this last time but after  talking about them they decided no to do this yet and to keep wrapping for a few more weeks.  Some confusion about how a velcro can be used but overall educated family to just let us  know when he would like to order one as he final garment.   MLD to Rt LE  as follows: Short neck, superficial and deep abdominals, Rt axillary and pectoral nodes, Rt inguino-axillary anastomosis, then Rt LE working from proximal to distal and retracing all steps, avoided area of hyperkeratosis as no elasticity in skin here so focused on areas of lower leg (medial and mostly lateral) where some skin stretch available, also to dorsal foot/toes then retracing all steps back to anastomosis.  Compression Bandaging: Cocoa butter applied to Rt lower leg and foot/toes, TG soft size large from foot to knee, Molelast to toes 1-4, artiflex to foot/ankle, 1/2" gray foam ant and post lower leg fixated with artiflex, then short stretch compression bandages as follows: 1-6 cm to foot/ankle with 1/2" gray foam to dorsal foot/lateral malleolous and wagon wheel to medial malleolous, then 1-8 cm ASH pattern, and 2-12 cm from ankle to below knee (first one with heel lock). Folded TG soft over top of bandage as pt reports this ended up sliding down his thigh.   06/18/23: Manual Therapy Removed bandages and washed leg with soap and water assessing skin throughout Remeasured LE  MLD to Rt LE  as follows: Short neck, superficial and deep abdominals, Rt axillary and pectoral  nodes, Rt inguino-axillary anastomosis, then Rt LE working from proximal to distal and retracing all steps, avoided area of hyperkeratosis as no elasticity in skin here so focused on areas of lower leg (medial and mostly lateral) where some skin stretch available, also to dorsal foot/toes then retracing all steps back to anastomosis.  Compression Bandaging: Cocoa butter applied to Rt lower leg and foot/toes, TG soft size large from foot to knee, Molelast to toes 1-4, artiflex to  foot/ankle, 1/2" gray foam ant and post lower leg fixated with artiflex, then short stretch compression bandages as follows: 1-6 cm to foot/ankle with 1/2" gray foam to dorsal foot/lateral malleolous and wagon wheel to medial malleolous, then 1-8 cm ASH pattern, and 2-12 cm from ankle to below knee (first one with heel lock). Folded TG soft over top of bandage as pt reports this ended up sliding down his thigh.   06/16/23: Manual Therapy Removed bandages and washed leg with soap and water assessing skin throughout which looked great.  Cut 1/2" gray foam for ant and post lower leg as pt had shorts on today so was able to add rest of bandage. MLD to Rt LE  as follows: Short neck, superficial and deep abdominals, Rt axillary and pectoral nodes, Rt inguino-axillary anastomosis, then Rt LE working from proximal to distal and retracing all steps, avoided area of hyperkeratosis as no elasticity in skin here so focused on areas of lower leg (medial and mostly lateral) where some skin stretch available, also to dorsal foot/toes then retracing all steps back to anastomosis.  Compression Bandaging: Cocoa butter applied to Rt lower leg and foot/toes, TG soft size large from foot to knee, Molelast to toes 1-4, artiflex to foot/ankle, 1/2" gray foam ant and post lower leg fixated with artiflex, then short stretch compression bandages as follows: 1-6 cm to foot/ankle with 1/2" gray foam to dorsal foot/lateral malleolous and wagon wheel to medial malleolous, then 1-8 cm ASH pattern, and 2-12 cm from ankle to below knee (first one with heel lock). Folded TG soft over top of bandage as pt reports this ended up sliding down his thigh.   PATIENT EDUCATION:  Education details: education on lymphedema, anatomy and physiology of the lymphatic system, complete CDT and eventual needs for compression garments, educated about need for post op shoe Person educated: Patient and Child(ren) Education method: Explanation Education  comprehension: verbalized understanding  HOME EXERCISE PROGRAM: Obtain post op shoe to begin bandaging  ASSESSMENT:  CLINICAL IMPRESSION: Continued CDT.   We discussed D/C garments again and pt would like to get a velcro but just not yet.     OBJECTIVE IMPAIRMENTS: decreased knowledge of condition, decreased knowledge of use of DME, difficulty walking, decreased ROM, increased edema, and pain.   ACTIVITY LIMITATIONS: bending, sitting, standing, squatting, sleeping, stairs, and transfers  PARTICIPATION LIMITATIONS: meal prep, cleaning, laundry, community activity, and yard work  PERSONAL FACTORS: Age, Fitness, Past/current experiences, and 1 comorbidity: kidney disease are also affecting patient's functional outcome.   REHAB POTENTIAL: Good  CLINICAL DECISION MAKING: Evolving/moderate complexity  EVALUATION COMPLEXITY: Moderate   GOALS: Goals reviewed with patient? Yes  SHORT TERM GOALS: Target date: 07/06/23  Pt will demonstrate a 4 cm reduction in circumferences 30 cm superior to floor at lateral foot bilaterally to decrease risk of infection. Baseline: Goal status: INITIAL  2.  Pt will report a 25% improvement in pain in bilateral LEs to allow improved comfort.  Baseline:  Goal status: INITIAL    LONG TERM  GOALS: Target date: 08/03/23  Pt will demonstrate maximal circumferential reductions as evidenced by no further significant reductions in circumferences to allow pt to be measured for compression garments.  Baseline:  Goal status: INITIAL  2.  Pt will obtain appropriate compression garments for day and night for long term management of lymphedema. Baseline:  Goal status: INITIAL  3.  Pt will benefit from a trial of a compression pump for long term management of lymphedema. Baseline:  Goal status: INITIAL  4.  Pt and/or daughter will be independent in self MLD for long term management of lymphedema. Baseline:  Goal status: INITIAL   PLAN:  PT FREQUENCY:  3x/week  PT DURATION: 8 weeks  PLANNED INTERVENTIONS: 97164- PT Re-evaluation, 97110-Therapeutic exercises, 97530- Therapeutic activity, 97535- Self Care, 82956- Manual therapy, [phone]- Orthotic Initial, [phone]- Orthotic/Prosthetic subsequent, Patient/Family education, Therapeutic exercises, Therapeutic activity, Neuromuscular re-education, and Self Care  PLAN FOR NEXT SESSION: Cont complete CDT of Rt LE for now, how did pt tolerate addition of second 12 cm bandage and leg foam? How was urinary output d/t kidney disease - once maximal reduction gained on Rt and garments are obtained then begin on Lt leg   Aldrick Derrig, Durward Gillis, PT 06/23/2023, 3:53 PM

## 2023-06-23 NOTE — Progress Notes (Signed)
 Patient counseled in clinic telephone visit note on 06/23/23

## 2023-06-23 NOTE — Telephone Encounter (Signed)
 [facility]       Telephone: [phone]?Fax: [phone]   Oncology Clinical Pharmacist Practitioner Initial Assessment  Michael [name] is a 89 y.o. male with a diagnosis of CLL. They were contacted today via telephone visit. He is accompanied by his daughter Michael [name].  I connected with Michael [name] today by telephone and verified that I was speaking with the correct person using two patient identifiers. I discussed the limitations, risks, security and privacy concerns of performing an evaluation and management service by telemedicine and the availability of in-person appointments. The patient/caregiver expressed understanding and agreed to proceed.  Other persons participating in the visit and their role in the encounter: Michael [name]   Patient's location: home  Provider's location: clinic  Indication/Regimen Acalabrutinib (Calquence) is being used appropriately for treatment of CLL by Dr. [name].      Wt Readings from Last 1 Encounters:  06/17/23 221 lb 3.2 oz (100.3 kg)    Estimated body surface area is 2.18 meters squared as calculated from the following:   Height as of 06/17/23: 5\' 7"  (1.702 m).   Weight as of 06/17/23: 221 lb 3.2 oz (100.3 kg).  The dosing regimen is 100 mg by mouth every 12 hours on days 1 to 28 of a 28-day cycle. This is being given  monotherapy. It is planned to continue until disease progression or unacceptable toxicity. Prescription dose and frequency assessed for appropriateness.  Patient has agreed to treatment which is documented in physician note on 06/11/23. Counseled patient on administration, dosing, side effects, monitoring, drug-food interactions, safe handling, storage, and disposal.  He will have acalabrutinib delivered tomorrow and start on 06/27/23 per Dr. Camilla Cedar instruction. She will see him with labs on 07/06/23.  Dose Modifications None at this time   Access Assessment Michael [name] will be receiving acalabrutinib  through [facility] Concerns: None Start date if known: 06/27/23  Adherence Assessment Reviewed importance on keeping a med schedule and plan for any missed doses Barriers to adherence identified? No  Allergies No Known Allergies  Vitals    06/17/2023   11:27 AM 06/12/2023    2:30 AM 06/12/2023    2:27 AM  Oncology Vitals  Height 170 cm    Weight 100.336 kg    Weight (lbs) 221 lbs 3 oz    BMI 34.64 kg/m2    Temp 98.3 F (36.8 C)  98.9 F (37.2 C)  Pulse Rate 67 53 63  BP 126/59 127/58 147/58  Resp 18 19 18   SpO2 96 % 96 % 98 %  BSA (m2) 2.18 m2       Laboratory Data    Latest Ref Rng & Units 06/11/2023   12:07 AM 06/03/2023   10:16 AM 03/05/2023   10:56 AM  CBC EXTENDED  WBC 4.0 - 10.5 K/uL 12.5  15.1  12.1   RBC 4.22 - 5.81 MIL/uL 3.11  3.35  3.86   Hemoglobin 13.0 - 17.0 g/dL 9.6  29.5  62.1   HCT 30.8 - 52.0 % 29.9  30.2  35.2   Platelets 150 - 400 K/uL 74  94  90   NEUT# 1.7 - 7.7 K/uL  4.2  2.9   Lymph# 0.7 - 4.0 K/uL  9.8  6.6        Latest Ref Rng & Units 06/11/2023   12:07 AM 06/03/2023   10:16 AM 03/05/2023   10:56 AM  CMP  Glucose 70 - 99 mg/dL 657  846  158   BUN 8 - 23 mg/dL 18  21  28    Creatinine 0.61 - 1.24 mg/dL 1.61  0.96  0.45   Sodium 135 - 145 mmol/L 137  142  139   Potassium 3.5 - 5.1 mmol/L 4.5  4.8  4.7   Chloride 98 - 111 mmol/L 107  105  101   CO2 22 - 32 mmol/L 24  32  33   Calcium  8.9 - 10.3 mg/dL 8.2  8.2  8.5   Total Protein 6.5 - 8.1 g/dL 6.2  6.6  6.5   Total Bilirubin 0.0 - 1.2 mg/dL 0.6  0.6  0.5   Alkaline Phos 38 - 126 U/L 55  56  63   AST 15 - 41 U/L 17  16  16    ALT 0 - 44 U/L 9  6  6     Lab Results  Component Value Date   MG 2.0 02/19/2022   MG 1.8 12/22/2017   MG 1.7 12/21/2017   No results found for: "CA2729"   Contraindications Contraindications were reviewed? Yes Contraindications to therapy were identified? No   Safety Precautions The following safety precautions for the use of  acalabrutinib were reviewed:  Fever: reviewed the importance of having a thermometer and the Centers for Disease Control and Prevention (CDC) definition of fever which is 100.23F (38C) or higher. Patient should call 24/7 triage at [phone] if experiencing a fever or any other symptoms Decreased hemoglobin, part of the red blood cells that carry iron  and oxygen Decreased platelet count and increased risk for bleeding Decreased white blood cells (WBCs) and increased risk for infection Diarrhea (loose and/or urgent bowel movements) Fatigue Headache Heart arrhythmias Muscle or joint pain or weakness Secondary malignancy Take antacids either 2 hours before or 2 hours after you take acalabrutinib (capsules only) Take with or without food Missed doses Handing body fluids and waste Pregnancy, sexual activity, and contraception Storage and handling  Medication Reconciliation Current Outpatient Medications  Medication Sig Dispense Refill   acetaminophen  (TYLENOL ) 650 MG CR tablet Take 1 tablet (650 mg total) by mouth 2 (two) times daily as needed for pain. (Patient taking differently: Take 650 mg by mouth 2 (two) times daily.) 30 tablet 0   Cholecalciferol  (VITAMIN D3) 1000 units CAPS Take 1,000 Units by mouth daily.     Cyanocobalamin  (VITAMIN B 12 PO) Take 1,000 mcg by mouth daily.     insulin  NPH-regular Human (70-30) 100 UNIT/ML injection Inject 35 Units into the skin 2 (two) times daily.     mirtazapine  (REMERON ) 15 MG tablet Take 1 tablet (15 mg total) by mouth at bedtime.     morphine  (MSIR) 15 MG tablet Take 15 mg by mouth 2 (two) times daily.     pantoprazole  (PROTONIX ) 40 MG tablet Take 40 mg by mouth daily.     polyethylene glycol (MIRALAX  / GLYCOLAX ) packet Take 17 g by mouth daily. 14 each 0   tamsulosin  (FLOMAX ) 0.4 MG CAPS capsule Take 0.4 mg by mouth daily.     acalabrutinib maleate (CALQUENCE) 100 MG tablet Take 1 tablet (100 mg total) by mouth 2 (two) times daily.  (Patient not taking: Reported on 06/23/2023) 60 tablet 11   acyclovir (ZOVIRAX) 400 MG tablet Take 1 tablet (400 mg total) by mouth daily. (Patient not taking: Reported on 06/23/2023) 30 tablet 6   torsemide  (DEMADEX ) 20 MG tablet Take 20 mg by mouth daily. (Patient not taking: Reported on 06/23/2023)  No current facility-administered medications for this visit.    Medication reconciliation is based on the patient's most recent medication list in the electronic medical record (EMR) including herbal products and OTC medications.   The patient's medication list was reviewed today with the patient? Yes   Drug-drug interactions (DDIs) DDIs were evaluated? Yes Significant DDIs identified? No   Drug-Food Interactions Drug-food interactions were evaluated? Yes Drug-food interactions identified? No   Follow-up Plan  Patient education handout given to patient Start acalabrutinib 100 mg by mouth every 12 hours. Will start on 06/27/23 per Dr.Gorsuch instructions Monitor for toxicities Labs, Dr. Marton Sleeper visit scheduled for 07/06/23 Michael [name] can follow up with clinical pharmacy as deemed necessary by Dr. [name] going forward   Michael [name] participated in the discussion, expressed understanding, and voiced agreement with the above plan. All questions were answered to their satisfaction. The patient was advised to contact the clinic at [phone] with any questions or concerns prior to their return visit.   I spent 30 minutes assessing the patient.  Michael [name] A. Michael [name], PharmD, BCOP, CPP  Michael [name], RPH-CPP, 06/23/2023 12:37 PM  **Disclaimer: This note was dictated with voice recognition software. Similar sounding words can inadvertently be transcribed and this note may contain transcription errors which may not have been corrected upon publication of note.**

## 2023-06-25 ENCOUNTER — Ambulatory Visit

## 2023-06-25 DIAGNOSIS — C9110 Chronic lymphocytic leukemia of B-cell type not having achieved remission: Secondary | ICD-10-CM

## 2023-06-25 DIAGNOSIS — R262 Difficulty in walking, not elsewhere classified: Secondary | ICD-10-CM

## 2023-06-25 DIAGNOSIS — I890 Lymphedema, not elsewhere classified: Secondary | ICD-10-CM

## 2023-06-25 NOTE — Therapy (Signed)
 OUTPATIENT PHYSICAL THERAPY  LOWER EXTREMITY ONCOLOGY TREATMENT  Patient Name: Michael [name] MRN: [id number] DOB:1930/05/11, 89 y.o., male Today's Date: 06/25/2023  END OF SESSION:  PT End of Session - 06/25/23 1107     Visit Number 6    Number of Visits 25    Date for PT Re-Evaluation 08/03/23    PT Start Time 1105    PT Stop Time 1209    PT Time Calculation (min) 64 min    Activity Tolerance Patient tolerated treatment well    Behavior During Therapy [facility] for tasks assessed/performed             Past Medical History:  Diagnosis Date   Anemia    Anginal pain (HCC)    upon exertion   Arthritis    "left shoulder, lower back" (10/20/2016)   CLL (chronic lymphoblastic leukemia) dx'd 2000   Edema leg    Family history of adverse reaction to anesthesia    "daughter's BP drops"    GERD (gastroesophageal reflux disease)    History of hiatal hernia    Leukemia, chronic lymphoid (HCC) 12/08/2010   Lymphocytosis    Type II diabetes mellitus (HCC)    Past Surgical History:  Procedure Laterality Date   BOWEL RESECTION N/A 11/15/2017   Procedure: SMALL BOWEL RESECTION;  Surgeon: Oza Blumenthal, MD;  Location: [facility] OR;  Service: General;  Laterality: N/A;   CATARACT EXTRACTION W/ INTRAOCULAR LENS  IMPLANT, BILATERAL Bilateral    EYE SURGERY     Cataracts (bilateral)   GROIN DISSECTION Left 11/15/2017   Procedure: GROIN EXPLORATION WITH REMOVAL OF INGUINAL HERNIA MESH;  Surgeon: Oza Blumenthal, MD;  Location: [facility] OR;  Service: General;  Laterality: Left;   INGUINAL HERNIA REPAIR Left 11/10/2017   Procedure: LEFT INGUINAL HERNIA REPAIR ERAS PATHWAY;  Surgeon: Oza Blumenthal, MD;  Location: [facility] OR;  Service: General;  Laterality: Left;   INGUINAL HERNIA REPAIR Left 11/15/2017   Procedure: LEFT INGUINAL HERNIA REPAIR WITH MESH;  Surgeon: Oza Blumenthal, MD;  Location: [facility] OR;  Service: General;  Laterality: Left;   INSERTION OF MESH Left 11/10/2017   Procedure: INSERTION  OF MESH;  Surgeon: Oza Blumenthal, MD;  Location: [facility];  Service: General;  Laterality: Left;   Patient Active Problem List   Diagnosis Date Noted   Iron  deficiency anemia 02/20/2022   Acute on chronic diastolic CHF (congestive heart failure) (HCC) 02/19/2022   Maxillary fracture (HCC) 02/19/2022   Syncope and collapse 02/19/2022   Stage 3a chronic kidney disease (CKD) (HCC) 02/19/2022   Obesity (BMI 30-39.9) 02/19/2022   Superficial retinal hemorrhage of right eye 07/16/2021   Stable treated proliferative diabetic retinopathy of right eye determined by examination associated with type 2 diabetes mellitus (HCC) 10/23/2019   Proliferative diabetic retinopathy of left eye (HCC) 10/23/2019   Chronic kidney disease, stage III (moderate) (HCC) 09/12/2019   Type II diabetes mellitus (HCC)    Acute cystitis without hematuria    Malnutrition of moderate degree 12/21/2017   Hypotension 12/14/2017   Hyponatremia 12/14/2017   ARF (acute renal failure) (HCC) 12/14/2017   Pressure ulcer 11/22/2017   Ileus (HCC) 11/14/2017   S/P left inguinal hernia repair 11/10/2017   Weight loss, non-intentional 04/12/2017   Essential hypertension 10/20/2016   Paresthesias 10/19/2016   Left inguinal hernia 10/12/2016   Protein-calorie malnutrition, mild (HCC) 01/10/2016   Diarrhea due to drug 06/17/2015   Chronic left shoulder pain 06/17/2015   Bilateral leg edema 06/17/2015   Pancytopenia, acquired (HCC)  04/22/2015   Thrombocytopenia (HCC) 10/11/2014   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   CLL (chronic lymphocytic leukemia) (HCC) 12/08/2010    PCP: [name], DO  REFERRING PROVIDER:  [name], MD  REFERRING DIAG: C91.10 (ICD-10-CM) - CLL (chronic lymphocytic leukemia) (HCC) R60.0 (ICD-10-CM) - Bilateral leg edema  THERAPY DIAG:  Lymphedema, not elsewhere classified  Difficulty in walking, not elsewhere classified  Leukemia, lymphocytic, chronic (HCC)  ONSET DATE:  12/27/22  Rationale for Evaluation and Treatment: Rehabilitation  SUBJECTIVE:                                                                                                                                                                                           SUBJECTIVE STATEMENT: I haven't been having any problems with the bandages. The new one she added was fine. I have been urinating more too.  PERTINENT HISTORY: CLL (chronic lymphocytic leukemia) (HCC) diagnosed on 04/03/2008. 04/29/2015 - 12/01/2021 Chemotherapy  04/29/2015: Ct scan showed mild lymphadenopathy and splenomegaly 01/08/2016: response to therapy.  06/11/23: Findings raise concern for possible recurrence of CLL - enlarged spleen and lymph nodes - starting new chemotherapy pill 06/27/23  PAIN:  Are you having pain? No, legs just feel sore now PRECAUTIONS: None  RED FLAGS: None   WEIGHT BEARING RESTRICTIONS: No  FALLS:  Has patient fallen in last 6 months? 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  COGNITION: Overall cognitive status: Within functional limits for tasks assessed   PALPATION: Fibrosis present in bilateral LEs  OBSERVATIONS / OTHER ASSESSMENTS: papillomas and hyperkeratosis present    Media Information  Document Information  Photos  Lymphedema  06/08/2023 12:37  Attached To:  Outpatient Rehab on 06/08/23 with Corlis Dienes, Harry Lindau, PT  Source  Information  [place], Harry Lindau, PT  [facility]  Document History    POSTURE: forward head, rounded shoulders   LYMPHEDEMA ASSESSMENTS:   LOWER EXTREMITY LANDMARK RIGHT eval 06/18/23 06/25/23  At groin     30 cm proximal to suprapatella     20 cm proximal to suprapatella 59.5  10 cm proximal to suprapatella 56.1 56.8 55.4  At midpatella / popliteal crease 47 48 42.4  40 cm proximal to floor at lateral plantar foot 55.8  46.6  30 cm proximal to floor at lateral plantar foot 53.8 49.3 47.3  20 cm proximal to floor at lateral plantar foot 46.9 44.9 42.4  10 cm proximal to floor at lateral plantar foot 38 34 31.4  Circumference of ankle/heel     5 cm proximal to 1st MTP joint 31.5 28.5 27.7  Across MTP joint 29.9 27.9 27.8  Around proximal great toe 12 11.0 11.3  (Blank rows = not tested)  LOWER EXTREMITY LANDMARK LEFT eval  At groin   30 cm proximal to suprapatella   20 cm proximal to suprapatella 57.5  10 cm proximal to suprapatella 55.9  At midpatella / popliteal crease 42.5  40 cm proximal to floor at lateral plantar foot 50.5  30 cm proximal to floor at lateral plantar foot 50  20 cm proximal to floor at lateral plantar foot 42  10 cm proximal to floor at lateral plantar foot 34.2  Circumference of ankle/heel   5 cm proximal to 1st MTP joint 30.4  Across MTP joint 30.1  Around proximal great toe 12.5  (Blank rows = not tested)  LLIS:  Flowsheet Row Outpatient Rehab from [facility]/08/2023 in [facility]  Lymphedema Life Impact Scale Total Score 72.06 %                                                 TREATMENT DATE:  06/25/23: Manual Therapy Removed bandages and washed leg with soap and water. Then re measured circumference. Pt conts with good reductions.  MLD to Rt LE  as follows: Short neck, superficial and deep abdominals, Rt axillary and pectoral nodes, Rt inguino-axillary anastomosis, then Rt LE working from proximal to  distal and retracing all steps, avoided area of hyperkeratosis as no elasticity in skin here so focused on areas of lower leg (just lateral today) where some skin stretch available, then retracing all steps back to anastomosis.  Compression Bandaging: Cocoa butter applied to Rt lower leg and foot/toes, cut new TG soft size large from foot to knee, Molelast to toes 1-4, artiflex to foot/ankle, 1/2" gray foam ant and post lower leg fixated with artiflex, then short stretch compression bandages as follows: 1-6 cm to foot/ankle with 1/2" gray foam to dorsal foot/lateral malleolous and wagon wheel to medial malleolous, then 1-8 cm ASH pattern, and 2-12 cm from ankle to below knee. Folded TG soft over top of bandage.  Made and issued chip pack for pt to wear against genitals where pt reports lymphedema still presents and fluctuates. He has noticed some improvement but reports this is not consistent. He was instructed to wear this on the outside of his underwear and then wear a second pair over this for increased compression and to hold chip pack in place. Pt verbalized good understanding.   06/23/23: Manual Therapy Discussed getting a velcro as pt and daughter showed interest in this last time but after talking about them they decided no to do this yet and to keep wrapping for a few more weeks.  Some confusion about how a velcro can be used but overall educated family to just let us  know when he would like to order one  as he final garment.   MLD to Rt LE  as follows: Short neck, superficial and deep abdominals, Rt axillary and pectoral nodes, Rt inguino-axillary anastomosis, then Rt LE working from proximal to distal and retracing all steps, avoided area of hyperkeratosis as no elasticity in skin here so focused on areas of lower leg (medial and mostly lateral) where some skin stretch available, also to dorsal foot/toes then retracing all steps back to anastomosis.  Compression Bandaging: Cocoa butter applied to Rt  lower leg and foot/toes, TG soft size large from foot to knee, Molelast to toes 1-4, artiflex to foot/ankle, 1/2" gray foam ant and post lower leg fixated with artiflex, then short stretch compression bandages as follows: 1-6 cm to foot/ankle with 1/2" gray foam to dorsal foot/lateral malleolous and wagon wheel to medial malleolous, then 1-8 cm ASH pattern, and 2-12 cm from ankle to below knee (first one with heel lock). Folded TG soft over top of bandage as pt reports this ended up sliding down his thigh.   06/18/23: Manual Therapy Removed bandages and washed leg with soap and water assessing skin throughout Remeasured LE  MLD to Rt LE  as follows: Short neck, superficial and deep abdominals, Rt axillary and pectoral nodes, Rt inguino-axillary anastomosis, then Rt LE working from proximal to distal and retracing all steps, avoided area of hyperkeratosis as no elasticity in skin here so focused on areas of lower leg (medial and mostly lateral) where some skin stretch available, also to dorsal foot/toes then retracing all steps back to anastomosis.  Compression Bandaging: Cocoa butter applied to Rt lower leg and foot/toes, TG soft size large from foot to knee, Molelast to toes 1-4, artiflex to foot/ankle, 1/2" gray foam ant and post lower leg fixated with artiflex, then short stretch compression bandages as follows: 1-6 cm to foot/ankle with 1/2" gray foam to dorsal foot/lateral malleolous and wagon wheel to medial malleolous, then 1-8 cm ASH pattern, and 2-12 cm from ankle to below knee (first one with heel lock). Folded TG soft over top of bandage as pt reports this ended up sliding down his thigh.       PATIENT EDUCATION:  Education details: education on lymphedema, anatomy and physiology of the lymphatic system, complete CDT and eventual needs for compression garments, educated about need for post op shoe Person educated: Patient and Child(ren) Education method: Explanation Education comprehension:  verbalized understanding  HOME EXERCISE PROGRAM: Obtain post op shoe to begin bandaging  ASSESSMENT:  CLINICAL IMPRESSION: Continued CDT.  Pt conts with good circumferential reductions and would like to cont bandaging as long as he is reducing then get velcro garment. Issued chip pack to address genital swelling, see above.   OBJECTIVE IMPAIRMENTS: decreased knowledge of condition, decreased knowledge of use of DME, difficulty walking, decreased ROM, increased edema, and pain.   ACTIVITY LIMITATIONS: bending, sitting, standing, squatting, sleeping, stairs, and transfers  PARTICIPATION LIMITATIONS: meal prep, cleaning, laundry, community activity, and yard work  PERSONAL FACTORS: Age, Fitness, Past/current experiences, and 1 comorbidity: kidney disease are also affecting patient's functional outcome.   REHAB POTENTIAL: Good  CLINICAL DECISION MAKING: Evolving/moderate complexity  EVALUATION COMPLEXITY: Moderate   GOALS: Goals reviewed with patient? Yes  SHORT TERM GOALS: Target date: 07/06/23  Pt will demonstrate a 4 cm reduction in circumferences 30 cm superior to floor at lateral foot bilaterally to decrease risk of infection. Baseline: Goal status: INITIAL  2.  Pt will report a 25% improvement in pain in bilateral LEs to allow  improved comfort.  Baseline:  Goal status: INITIAL    LONG TERM GOALS: Target date: 08/03/23  Pt will demonstrate maximal circumferential reductions as evidenced by no further significant reductions in circumferences to allow pt to be measured for compression garments.  Baseline:  Goal status: INITIAL  2.  Pt will obtain appropriate compression garments for day and night for long term management of lymphedema. Baseline:  Goal status: INITIAL  3.  Pt will benefit from a trial of a compression pump for long term management of lymphedema. Baseline:  Goal status: INITIAL  4.  Pt and/or daughter will be independent in self MLD for long term  management of lymphedema. Baseline:  Goal status: INITIAL   PLAN:  PT FREQUENCY: 3x/week  PT DURATION: 8 weeks  PLANNED INTERVENTIONS: [id number]- PT Re-evaluation, 97110-Therapeutic exercises, 97530- Therapeutic activity, 97535- Self Care, [id number]- Manual therapy, 97760- Orthotic Initial, [phone]- Orthotic/Prosthetic subsequent, Patient/Family education, Therapeutic exercises, Therapeutic activity, Neuromuscular re-education, and Self Care  PLAN FOR NEXT SESSION: Did chip pack seem to help genital swelling at all? Cont complete CDT of Rt LE. Cont to monitor urinary output d/t kidney disease - once maximal reduction gained on Rt and garments are obtained then begin on Lt leg   Denyce Flank, PTA 06/25/2023, 12:20 PM

## 2023-06-28 ENCOUNTER — Other Ambulatory Visit (HOSPITAL_COMMUNITY): Payer: Self-pay

## 2023-06-28 ENCOUNTER — Encounter: Payer: Self-pay | Admitting: Rehabilitation

## 2023-06-28 ENCOUNTER — Ambulatory Visit: Attending: Hematology and Oncology | Admitting: Rehabilitation

## 2023-06-28 DIAGNOSIS — C9110 Chronic lymphocytic leukemia of B-cell type not having achieved remission: Secondary | ICD-10-CM | POA: Diagnosis present

## 2023-06-28 DIAGNOSIS — R262 Difficulty in walking, not elsewhere classified: Secondary | ICD-10-CM | POA: Insufficient documentation

## 2023-06-28 DIAGNOSIS — I890 Lymphedema, not elsewhere classified: Secondary | ICD-10-CM | POA: Diagnosis present

## 2023-06-28 NOTE — Therapy (Signed)
 OUTPATIENT PHYSICAL THERAPY  LOWER EXTREMITY ONCOLOGY TREATMENT  Patient Name: Michael [name]: [id number] DOB:[birth year], 89 y.o., male Today's Date: 06/28/2023  END OF SESSION:  PT End of Session - 06/28/23 1201     Visit Number 7    Number of Visits 25    Date for PT Re-Evaluation 08/03/23    PT Start Time 1203    PT Stop Time 1300    PT Time Calculation (min) 57 min    Activity Tolerance Patient tolerated treatment well    Behavior During Therapy [facility] for tasks assessed/performed             Past Medical History:  Diagnosis Date   Anemia    Anginal pain (HCC)    upon exertion   Arthritis    "left shoulder, lower back" (10/20/2016)   CLL (chronic lymphoblastic leukemia) dx'd 2000   Edema leg    Family history of adverse reaction to anesthesia    "daughter's BP drops"    GERD (gastroesophageal reflux disease)    History of hiatal hernia    Leukemia, chronic lymphoid (HCC) 12/08/2010   Lymphocytosis    Type II diabetes mellitus (HCC)    Past Surgical History:  Procedure Laterality Date   BOWEL RESECTION N/A 11/15/2017   Procedure: SMALL BOWEL RESECTION;  Surgeon: Oza Blumenthal, MD;  Location: [facility] OR;  Service: General;  Laterality: N/A;   CATARACT EXTRACTION W/ INTRAOCULAR LENS  IMPLANT, BILATERAL Bilateral    EYE SURGERY     Cataracts (bilateral)   GROIN DISSECTION Left 11/15/2017   Procedure: GROIN EXPLORATION WITH REMOVAL OF INGUINAL HERNIA MESH;  Surgeon: Oza Blumenthal, MD;  Location: [facility] OR;  Service: General;  Laterality: Left;   INGUINAL HERNIA REPAIR Left 11/10/2017   Procedure: LEFT INGUINAL HERNIA REPAIR ERAS PATHWAY;  Surgeon: Oza Blumenthal, MD;  Location: [facility] OR;  Service: General;  Laterality: Left;   INGUINAL HERNIA REPAIR Left 11/15/2017   Procedure: LEFT INGUINAL HERNIA REPAIR WITH MESH;  Surgeon: Oza Blumenthal, MD;  Location: [facility] OR;  Service: General;  Laterality: Left;   INSERTION OF MESH Left 11/10/2017   Procedure: INSERTION  OF MESH;  Surgeon: Oza Blumenthal, MD;  Location: [facility] OR;  Service: General;  Laterality: Left;   Patient Active Problem List   Diagnosis Date Noted   Iron  deficiency anemia 02/20/2022   Acute on chronic diastolic CHF (congestive heart failure) (HCC) 02/19/2022   Maxillary fracture (HCC) 02/19/2022   Syncope and collapse 02/19/2022   Stage 3a chronic kidney disease (CKD) (HCC) 02/19/2022   Obesity (BMI 30-39.9) 02/19/2022   Superficial retinal hemorrhage of right eye 07/16/2021   Stable treated proliferative diabetic retinopathy of right eye determined by examination associated with type 2 diabetes mellitus (HCC) 10/23/2019   Proliferative diabetic retinopathy of left eye (HCC) 10/23/2019   Chronic kidney disease, stage III (moderate) (HCC) 09/12/2019   Type II diabetes mellitus (HCC)    Acute cystitis without hematuria    Malnutrition of moderate degree 12/21/2017   Hypotension 12/14/2017   Hyponatremia 12/14/2017   ARF (acute renal failure) (HCC) 12/14/2017   Pressure ulcer 11/22/2017   Ileus (HCC) 11/14/2017   S/P left inguinal hernia repair 11/10/2017   Weight loss, non-intentional 04/12/2017   Essential hypertension 10/20/2016   Paresthesias 10/19/2016   Left inguinal hernia 10/12/2016   Protein-calorie malnutrition, mild (HCC) 01/10/2016   Diarrhea due to drug 06/17/2015   Chronic left shoulder pain 06/17/2015   Bilateral leg edema 06/17/2015   Pancytopenia, acquired (HCC)  04/22/2015   Thrombocytopenia (HCC) 10/11/2014   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   CLL (chronic lymphocytic leukemia) (HCC) 12/08/2010    PCP: [name], DO  REFERRING PROVIDER:  [name], MD  REFERRING DIAG: C91.10 (ICD-10-CM) - CLL (chronic lymphocytic leukemia) (HCC) R60.0 (ICD-10-CM) - Bilateral leg edema  THERAPY DIAG:  Lymphedema, not elsewhere classified  Difficulty in walking, not elsewhere classified  Leukemia, lymphocytic, chronic (HCC)  ONSET DATE:  12/27/22  Rationale for Evaluation and Treatment: Rehabilitation  SUBJECTIVE:                                                                                                                                                                                           SUBJECTIVE STATEMENT: I have had it off since this morning.    PERTINENT HISTORY: CLL (chronic lymphocytic leukemia) (HCC) diagnosed on 04/03/2008. 04/29/2015 - 12/01/2021 Chemotherapy  04/29/2015: Ct scan showed mild lymphadenopathy and splenomegaly 01/08/2016: response to therapy.  06/11/23: Findings raise concern for possible recurrence of CLL - enlarged spleen and lymph nodes - starting new chemotherapy pill 06/27/23  PAIN:  Are you having pain? No, legs just feel sore now PRECAUTIONS: None  RED FLAGS: None   WEIGHT BEARING RESTRICTIONS: No  FALLS:  Has patient fallen in last 6 months? 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  COGNITION: Overall cognitive status: Within functional limits for tasks assessed   PALPATION: Fibrosis present in bilateral LEs  OBSERVATIONS / OTHER ASSESSMENTS: papillomas and hyperkeratosis present    Media Information  Document Information  Photos  Lymphedema  06/08/2023 12:37  Attached To:  Outpatient Rehab on 06/08/23 with Corlis Dienes, Harry Lindau, PT  Source Information  [place], Harry Lindau, PT  [facility]  Document History     POSTURE: forward head, rounded shoulders   LYMPHEDEMA ASSESSMENTS:   LOWER EXTREMITY LANDMARK RIGHT eval 06/18/23 06/25/23  At groin     30 cm proximal to suprapatella     20 cm proximal to suprapatella 59.5    10 cm proximal to suprapatella 56.1 56.8 55.4  At midpatella /  popliteal crease 47 48 42.4  40 cm proximal to floor at lateral plantar foot 55.8  46.6  30 cm proximal to floor at lateral plantar foot 53.8 49.3 47.3  20 cm proximal to floor at lateral plantar foot 46.9 44.9 42.4  10 cm proximal to floor at lateral plantar foot 38 34 31.4  Circumference of ankle/heel     5 cm proximal to 1st MTP joint 31.5 28.5 27.7  Across MTP joint 29.9 27.9 27.8  Around proximal great toe 12 11.0 11.3  (Blank rows = not tested)  LOWER EXTREMITY LANDMARK LEFT eval  At groin   30 cm proximal to suprapatella   20 cm proximal to suprapatella 57.5  10 cm proximal to suprapatella 55.9  At midpatella / popliteal crease 42.5  40 cm proximal to floor at lateral plantar foot 50.5  30 cm proximal to floor at lateral plantar foot 50  20 cm proximal to floor at lateral plantar foot 42  10 cm proximal to floor at lateral plantar foot 34.2  Circumference of ankle/heel   5 cm proximal to 1st MTP joint 30.4  Across MTP joint 30.1  Around proximal great toe 12.5  (Blank rows = not tested)  LLIS:  Flowsheet Row Outpatient Rehab from 06/08/2023 in [facility]  Lymphedema Life Impact Scale Total Score 72.06 %                                                 TREATMENT DATE:  06/28/23: Manual Therapy Removed bandages and washed leg with soap and water. Then re measured circumference. Pt conts with good reductions.  MLD to Rt LE  as follows: Short neck, superficial and deep abdominals, Rt axillary and pectoral nodes, Rt inguino-axillary anastomosis, then Rt LE working from proximal to distal and retracing all steps, avoided area of hyperkeratosis as no elasticity in skin  here so focused on areas of lower leg (just lateral today) where some skin stretch available, then retracing all steps back to anastomosis.  Compression Bandaging: Cocoa butter applied to Rt lower leg and foot/toes, cut new TG soft size large from foot to knee, Molelast to toes 1-4, artiflex to foot/ankle, 1/2" gray foam ant and post lower leg fixated with artiflex, then short stretch compression bandages as follows: 1-6 cm to foot/ankle with 1/2" gray foam to dorsal foot/lateral malleolous and wagon wheel to medial malleolous, then 1-8 cm ASH pattern, and 2-12 cm from ankle to below knee. Folded TG soft over top of bandage.   06/25/23: Manual Therapy Removed bandages and washed leg with soap and water. Then re measured circumference. Pt conts with good reductions.  MLD to Rt LE  as follows: Short neck, superficial and deep abdominals, Rt axillary and pectoral nodes, Rt inguino-axillary anastomosis, then Rt LE working from proximal to distal and retracing all steps, avoided area of hyperkeratosis as no elasticity in skin here so focused on areas of lower leg (just lateral today) where some skin stretch available, then retracing all steps back to anastomosis.  Compression Bandaging: Cocoa butter applied to Rt lower leg and foot/toes, cut new TG soft size large from foot to knee, Molelast to toes 1-4, artiflex to foot/ankle, 1/2" gray foam ant and post lower leg fixated with artiflex, then short stretch compression bandages as follows: 1-6 cm to foot/ankle with 1/2" gray  foam to dorsal foot/lateral malleolous and wagon wheel to medial malleolous, then 1-8 cm ASH pattern, and 2-12 cm from ankle to below knee. Folded TG soft over top of bandage.  Made and issued chip pack for pt to wear against genitals where pt reports lymphedema still presents and fluctuates. He has noticed some improvement but reports this is not consistent. He was instructed to wear this on the outside of his underwear and then wear a second  pair over this for increased compression and to hold chip pack in place. Pt verbalized good understanding.   06/23/23: Manual Therapy Discussed getting a velcro as pt and daughter showed interest in this last time but after talking about them they decided no to do this yet and to keep wrapping for a few more weeks.  Some confusion about how a velcro can be used but overall educated family to just let us  know when he would like to order one as he final garment.   MLD to Rt LE  as follows: Short neck, superficial and deep abdominals, Rt axillary and pectoral nodes, Rt inguino-axillary anastomosis, then Rt LE working from proximal to distal and retracing all steps, avoided area of hyperkeratosis as no elasticity in skin here so focused on areas of lower leg (medial and mostly lateral) where some skin stretch available, also to dorsal foot/toes then retracing all steps back to anastomosis.  Compression Bandaging: Cocoa butter applied to Rt lower leg and foot/toes, TG soft size large from foot to knee, Molelast to toes 1-4, artiflex to foot/ankle, 1/2" gray foam ant and post lower leg fixated with artiflex, then short stretch compression bandages as follows: 1-6 cm to foot/ankle with 1/2" gray foam to dorsal foot/lateral malleolous and wagon wheel to medial malleolous, then 1-8 cm ASH pattern, and 2-12 cm from ankle to below knee (first one with heel lock). Folded TG soft over top of bandage as pt reports this ended up sliding down his thigh.   06/18/23: Manual Therapy Removed bandages and washed leg with soap and water assessing skin throughout Remeasured LE  MLD to Rt LE  as follows: Short neck, superficial and deep abdominals, Rt axillary and pectoral nodes, Rt inguino-axillary anastomosis, then Rt LE working from proximal to distal and retracing all steps, avoided area of hyperkeratosis as no elasticity in skin here so focused on areas of lower leg (medial and mostly lateral) where some skin stretch  available, also to dorsal foot/toes then retracing all steps back to anastomosis.  Compression Bandaging: Cocoa butter applied to Rt lower leg and foot/toes, TG soft size large from foot to knee, Molelast to toes 1-4, artiflex to foot/ankle, 1/2" gray foam ant and post lower leg fixated with artiflex, then short stretch compression bandages as follows: 1-6 cm to foot/ankle with 1/2" gray foam to dorsal foot/lateral malleolous and wagon wheel to medial malleolous, then 1-8 cm ASH pattern, and 2-12 cm from ankle to below knee (first one with heel lock). Folded TG soft over top of bandage as pt reports this ended up sliding down his thigh.       PATIENT EDUCATION:  Education details: education on lymphedema, anatomy and physiology of the lymphatic system, complete CDT and eventual needs for compression garments, educated about need for post op shoe Person educated: Patient and Child(ren) Education method: Explanation Education comprehension: verbalized understanding  HOME EXERCISE PROGRAM: Obtain post op shoe to begin bandaging  ASSESSMENT:  CLINICAL IMPRESSION: Continued CDT.  Pt conts with good circumferential reductions and  would like to cont bandaging as long as he is reducing then get velcro garment.   OBJECTIVE IMPAIRMENTS: decreased knowledge of condition, decreased knowledge of use of DME, difficulty walking, decreased ROM, increased edema, and pain.   ACTIVITY LIMITATIONS: bending, sitting, standing, squatting, sleeping, stairs, and transfers  PARTICIPATION LIMITATIONS: meal prep, cleaning, laundry, community activity, and yard work  PERSONAL FACTORS: Age, Fitness, Past/current experiences, and 1 comorbidity: kidney disease are also affecting patient's functional outcome.   REHAB POTENTIAL: Good  CLINICAL DECISION MAKING: Evolving/moderate complexity  EVALUATION COMPLEXITY: Moderate   GOALS: Goals reviewed with patient? Yes  SHORT TERM GOALS: Target date: 07/06/23  Pt  will demonstrate a 4 cm reduction in circumferences 30 cm superior to floor at lateral foot bilaterally to decrease risk of infection. Baseline: Goal status: INITIAL  2.  Pt will report a 25% improvement in pain in bilateral LEs to allow improved comfort.  Baseline:  Goal status: INITIAL    LONG TERM GOALS: Target date: 08/03/23  Pt will demonstrate maximal circumferential reductions as evidenced by no further significant reductions in circumferences to allow pt to be measured for compression garments.  Baseline:  Goal status: INITIAL  2.  Pt will obtain appropriate compression garments for day and night for long term management of lymphedema. Baseline:  Goal status: INITIAL  3.  Pt will benefit from a trial of a compression pump for long term management of lymphedema. Baseline:  Goal status: INITIAL  4.  Pt and/or daughter will be independent in self MLD for long term management of lymphedema. Baseline:  Goal status: INITIAL   PLAN:  PT FREQUENCY: 3x/week  PT DURATION: 8 weeks  PLANNED INTERVENTIONS: [id number]- PT Re-evaluation, [id number]-Therapeutic exercises, 97530- Therapeutic activity, 97535- Self Care, [id number]- Manual therapy, 97760- Orthotic Initial, [phone]- Orthotic/Prosthetic subsequent, Patient/Family education, Therapeutic exercises, Therapeutic activity, Neuromuscular re-education, and Self Care  PLAN FOR NEXT SESSION:  Cont complete CDT of Rt LE. Cont to monitor urinary output d/t kidney disease - once maximal reduction gained on Rt and garments are obtained then begin on Lt leg   Envy Meno, Durward Gillis, PT 06/28/2023, 1:17 PM

## 2023-06-30 ENCOUNTER — Encounter: Payer: Self-pay | Admitting: Rehabilitation

## 2023-06-30 ENCOUNTER — Ambulatory Visit: Admitting: Rehabilitation

## 2023-06-30 DIAGNOSIS — C9110 Chronic lymphocytic leukemia of B-cell type not having achieved remission: Secondary | ICD-10-CM

## 2023-06-30 DIAGNOSIS — R262 Difficulty in walking, not elsewhere classified: Secondary | ICD-10-CM

## 2023-06-30 DIAGNOSIS — I890 Lymphedema, not elsewhere classified: Secondary | ICD-10-CM | POA: Diagnosis not present

## 2023-06-30 NOTE — Therapy (Signed)
 OUTPATIENT PHYSICAL THERAPY  LOWER EXTREMITY ONCOLOGY TREATMENT  Patient Name: Michael [name] MRN: [id number] DOB:[birth year], 89 y.o., male Today's Date: 06/30/2023  END OF SESSION:  PT End of Session - 06/30/23 1157     Visit Number 8    Number of Visits 25    Date for PT Re-Evaluation 08/03/23    PT Start Time 1202    PT Stop Time 1259    PT Time Calculation (min) 57 min    Activity Tolerance Patient tolerated treatment well    Behavior During Therapy [employer] for tasks assessed/performed             Past Medical History:  Diagnosis Date   Anemia    Anginal pain (HCC)    upon exertion   Arthritis    "left shoulder, lower back" (10/20/2016)   CLL (chronic lymphoblastic leukemia) dx'd 2000   Edema leg    Family history of adverse reaction to anesthesia    "daughter's BP drops"    GERD (gastroesophageal reflux disease)    History of hiatal hernia    Leukemia, chronic lymphoid (HCC) 12/08/2010   Lymphocytosis    Type II diabetes mellitus (HCC)    Past Surgical History:  Procedure Laterality Date   BOWEL RESECTION N/A 11/15/2017   Procedure: SMALL BOWEL RESECTION;  Surgeon: Oza Blumenthal, MD;  Location: [facility] OR;  Service: General;  Laterality: N/A;   CATARACT EXTRACTION W/ INTRAOCULAR LENS  IMPLANT, BILATERAL Bilateral    EYE SURGERY     Cataracts (bilateral)   GROIN DISSECTION Left 11/15/2017   Procedure: GROIN EXPLORATION WITH REMOVAL OF INGUINAL HERNIA MESH;  Surgeon: Oza Blumenthal, MD;  Location: [facility] OR;  Service: General;  Laterality: Left;   INGUINAL HERNIA REPAIR Left 11/10/2017   Procedure: LEFT INGUINAL HERNIA REPAIR ERAS PATHWAY;  Surgeon: Oza Blumenthal, MD;  Location: [facility] OR;  Service: General;  Laterality: Left;   INGUINAL HERNIA REPAIR Left 11/15/2017   Procedure: LEFT INGUINAL HERNIA REPAIR WITH MESH;  Surgeon: Oza Blumenthal, MD;  Location: [facility] OR;  Service: General;  Laterality: Left;   INSERTION OF MESH Left 11/10/2017   Procedure: INSERTION  OF MESH;  Surgeon: Oza Blumenthal, MD;  Location[facility];  Service: General;  Laterality: Left;   Patient Active Problem List   Diagnosis Date Noted   Iron  deficiency anemia 02/20/2022   Acute on chronic diastolic CHF (congestive heart failure) (HCC) 02/19/2022   Maxillary fracture (HCC) 02/19/2022   Syncope and collapse 02/19/2022   Stage 3a chronic kidney disease (CKD) (HCC) 02/19/2022   Obesity (BMI 30-39.9) 02/19/2022   Superficial retinal hemorrhage of right eye 07/16/2021   Stable treated proliferative diabetic retinopathy of right eye determined by examination associated with type 2 diabetes mellitus (HCC) 10/23/2019   Proliferative diabetic retinopathy of left eye (HCC) 10/23/2019   Chronic kidney disease, stage III (moderate) (HCC) 09/12/2019   Type II diabetes mellitus (HCC)    Acute cystitis without hematuria    Malnutrition of moderate degree 12/21/2017   Hypotension 12/14/2017   Hyponatremia 12/14/2017   ARF (acute renal failure) (HCC) 12/14/2017   Pressure ulcer 11/22/2017   Ileus (HCC) 11/14/2017   S/P left inguinal hernia repair 11/10/2017   Weight loss, non-intentional 04/12/2017   Essential hypertension 10/20/2016   Paresthesias 10/19/2016   Left inguinal hernia 10/12/2016   Protein-calorie malnutrition, mild (HCC) 01/10/2016   Diarrhea due to drug 06/17/2015   Chronic left shoulder pain 06/17/2015   Bilateral leg edema 06/17/2015   Pancytopenia, acquired (HCC)  04/22/2015   Thrombocytopenia (HCC) 10/11/2014   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   CLL (chronic lymphocytic leukemia) (HCC) 12/08/2010    PCP: [name], DO  REFERRING PROVIDER:  [name], MD  REFERRING DIAG: C91.10 (ICD-10-CM) - CLL (chronic lymphocytic leukemia) (HCC) R60.0 (ICD-10-CM) - Bilateral leg edema  THERAPY DIAG:  Lymphedema, not elsewhere classified  Difficulty in walking, not elsewhere classified  Leukemia, lymphocytic, chronic (HCC)  ONSET DATE:  12/27/22  Rationale for Evaluation and Treatment: Rehabilitation  SUBJECTIVE:                                                                                                                                                                                           SUBJECTIVE STATEMENT: I have had it off since this morning.    PERTINENT HISTORY: CLL (chronic lymphocytic leukemia) (HCC) diagnosed on 04/03/2008. 04/29/2015 - 12/01/2021 Chemotherapy  04/29/2015: Ct scan showed mild lymphadenopathy and splenomegaly 01/08/2016: response to therapy.  06/11/23: Findings raise concern for possible recurrence of CLL - enlarged spleen and lymph nodes - starting new chemotherapy pill 06/27/23  PAIN:  Are you having pain? No, legs just feel sore now PRECAUTIONS: None  RED FLAGS: None   WEIGHT BEARING RESTRICTIONS: No  FALLS:  Has patient fallen in last 6 months? 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  COGNITION: Overall cognitive status: Within functional limits for tasks assessed   PALPATION: Fibrosis present in bilateral LEs  OBSERVATIONS / OTHER ASSESSMENTS: papillomas and hyperkeratosis present    Media Information  Document Information  Photos  Lymphedema  06/08/2023 12:37  Attached To:  Outpatient Rehab on 06/08/23 with Corlis Dienes, Harry Lindau, PT  Source Information  [place], Harry Lindau, PT  [facility]  Document History     POSTURE: forward head, rounded shoulders   LYMPHEDEMA ASSESSMENTS:   LOWER EXTREMITY LANDMARK RIGHT eval 06/18/23 06/25/23  At groin     30 cm proximal to suprapatella     20 cm proximal to suprapatella 59.5    10 cm proximal to suprapatella 56.1 56.8 55.4  At midpatella /  popliteal crease 47 48 42.4  40 cm proximal to floor at lateral plantar foot 55.8  46.6  30 cm proximal to floor at lateral plantar foot 53.8 49.3 47.3  20 cm proximal to floor at lateral plantar foot 46.9 44.9 42.4  10 cm proximal to floor at lateral plantar foot 38 34 31.4  Circumference of ankle/heel     5 cm proximal to 1st MTP joint 31.5 28.5 27.7  Across MTP joint 29.9 27.9 27.8  Around proximal great toe 12 11.0 11.3  (Blank rows = not tested)  LOWER EXTREMITY LANDMARK LEFT eval  At groin   30 cm proximal to suprapatella   20 cm proximal to suprapatella 57.5  10 cm proximal to suprapatella 55.9  At midpatella / popliteal crease 42.5  40 cm proximal to floor at lateral plantar foot 50.5  30 cm proximal to floor at lateral plantar foot 50  20 cm proximal to floor at lateral plantar foot 42  10 cm proximal to floor at lateral plantar foot 34.2  Circumference of ankle/heel   5 cm proximal to 1st MTP joint 30.4  Across MTP joint 30.1  Around proximal great toe 12.5  (Blank rows = not tested)  LLIS:  Flowsheet Row Outpatient Rehab from 06/08/2023 in [facility]  Lymphedema Life Impact Scale Total Score 72.06 %                                                 TREATMENT DATE:  06/30/23: Manual Therapy Removed bandages and washed leg with soap and water.   MLD to Rt LE  as follows: Short neck, superficial and deep abdominals, Rt axillary and pectoral nodes, Rt inguino-axillary anastomosis, then Rt LE working from proximal to distal and retracing all steps,  then retracing all steps back to anastomosis.  Compression Bandaging: Cocoa butter applied to Rt lower leg and  foot/toes, cut new TG soft size large from foot to knee, Molelast to toes 1-4, artiflex to foot/ankle, 1/2" gray foam ant and post lower leg fixated with artiflex, then short stretch compression bandages as follows: 1-6 cm to foot/ankle with 1/2" gray foam to dorsal foot/lateral malleolous and wagon wheel to medial malleolous, then 1-8 cm ASH pattern, and 2-12 cm from ankle to below knee. Folded TG soft over top of bandage.  06/28/23: Manual Therapy MLD to Rt LE  as follows: Short neck, superficial and deep abdominals, Rt axillary and pectoral nodes, Rt inguino-axillary anastomosis, then Rt LE working from proximal to distal and retracing all steps, avoided area of hyperkeratosis as no elasticity in skin here so focused on areas of lower leg (just lateral today) where some skin stretch available, then retracing all steps back to anastomosis.  Compression Bandaging: Cocoa butter applied to Rt lower leg and foot/toes, cut new TG soft size large from foot to knee, Molelast to toes 1-4, artiflex to foot/ankle, 1/2" gray foam ant and post lower leg fixated with artiflex, then short stretch compression bandages as follows: 1-6 cm to foot/ankle with 1/2" gray foam to dorsal foot/lateral malleolous and wagon wheel to medial malleolous, then 1-8 cm ASH pattern, and 2-12 cm from ankle to below knee. Folded TG soft over top of bandage.   06/25/23: Manual Therapy Removed bandages and washed leg with soap and water. Then re measured circumference. Pt conts with  good reductions.  MLD to Rt LE  as follows: Short neck, superficial and deep abdominals, Rt axillary and pectoral nodes, Rt inguino-axillary anastomosis, then Rt LE working from proximal to distal and retracing all steps, avoided area of hyperkeratosis as no elasticity in skin here so focused on areas of lower leg (just lateral today) where some skin stretch available, then retracing all steps back to anastomosis.  Compression Bandaging: Cocoa butter applied to Rt  lower leg and foot/toes, cut new TG soft size large from foot to knee, Molelast to toes 1-4, artiflex to foot/ankle, 1/2" gray foam ant and post lower leg fixated with artiflex, then short stretch compression bandages as follows: 1-6 cm to foot/ankle with 1/2" gray foam to dorsal foot/lateral malleolous and wagon wheel to medial malleolous, then 1-8 cm ASH pattern, and 2-12 cm from ankle to below knee. Folded TG soft over top of bandage.  Made and issued chip pack for pt to wear against genitals where pt reports lymphedema still presents and fluctuates. He has noticed some improvement but reports this is not consistent. He was instructed to wear this on the outside of his underwear and then wear a second pair over this for increased compression and to hold chip pack in place. Pt verbalized good understanding.    PATIENT EDUCATION:  Education details: education on lymphedema, anatomy and physiology of the lymphatic system, complete CDT and eventual needs for compression garments, educated about need for post op shoe Person educated: Patient and Child(ren) Education method: Explanation Education comprehension: verbalized understanding  HOME EXERCISE PROGRAM: Obtain post op shoe to begin bandaging  ASSESSMENT:  CLINICAL IMPRESSION: Continued CDT.  Left LE is now smaller than the Rt.  Will measure next visit and discuss velcro.    OBJECTIVE IMPAIRMENTS: decreased knowledge of condition, decreased knowledge of use of DME, difficulty walking, decreased ROM, increased edema, and pain.   ACTIVITY LIMITATIONS: bending, sitting, standing, squatting, sleeping, stairs, and transfers  PARTICIPATION LIMITATIONS: meal prep, cleaning, laundry, community activity, and yard work  PERSONAL FACTORS: Age, Fitness, Past/current experiences, and 1 comorbidity: kidney disease are also affecting patient's functional outcome.   REHAB POTENTIAL: Good  CLINICAL DECISION MAKING: Evolving/moderate  complexity  EVALUATION COMPLEXITY: Moderate   GOALS: Goals reviewed with patient? Yes  SHORT TERM GOALS: Target date: 07/06/23  Pt will demonstrate a 4 cm reduction in circumferences 30 cm superior to floor at lateral foot bilaterally to decrease risk of infection. Baseline: Goal status: INITIAL  2.  Pt will report a 25% improvement in pain in bilateral LEs to allow improved comfort.  Baseline:  Goal status: INITIAL    LONG TERM GOALS: Target date: 08/03/23  Pt will demonstrate maximal circumferential reductions as evidenced by no further significant reductions in circumferences to allow pt to be measured for compression garments.  Baseline:  Goal status: INITIAL  2.  Pt will obtain appropriate compression garments for day and night for long term management of lymphedema. Baseline:  Goal status: INITIAL  3.  Pt will benefit from a trial of a compression pump for long term management of lymphedema. Baseline:  Goal status: INITIAL  4.  Pt and/or daughter will be independent in self MLD for long term management of lymphedema. Baseline:  Goal status: INITIAL   PLAN:  PT FREQUENCY: 3x/week  PT DURATION: 8 weeks  PLANNED INTERVENTIONS: [id number]- PT Re-evaluation, 97110-Therapeutic exercises, 97530- Therapeutic activity, 97535- Self Care, [id number]- Manual therapy, [id number]- Orthotic Initial, [id number]- Orthotic/Prosthetic subsequent, Patient/Family education, Therapeutic exercises, Therapeutic activity, Neuromuscular  re-education, and Self Care  PLAN FOR NEXT SESSION:  Cont complete CDT of Rt LE. Cont to monitor urinary output d/t kidney disease - once maximal reduction gained on Rt and garments are obtained then begin on Lt leg   Emmaly Leech, Durward Gillis, PT 06/30/2023, 1:01 PM

## 2023-07-02 ENCOUNTER — Ambulatory Visit: Admitting: Rehabilitation

## 2023-07-02 DIAGNOSIS — R262 Difficulty in walking, not elsewhere classified: Secondary | ICD-10-CM

## 2023-07-02 DIAGNOSIS — I890 Lymphedema, not elsewhere classified: Secondary | ICD-10-CM

## 2023-07-02 DIAGNOSIS — C9110 Chronic lymphocytic leukemia of B-cell type not having achieved remission: Secondary | ICD-10-CM

## 2023-07-02 NOTE — Therapy (Signed)
 OUTPATIENT PHYSICAL THERAPY  LOWER EXTREMITY ONCOLOGY TREATMENT  Patient Name: Michael [name] MRN: [id number] DOB:1930-08-08, 89 y.o., male Today's Date: 07/05/2023  END OF SESSION:  PT End of Session - 07/05/23 0848     Visit Number 9    Number of Visits 25    Date for PT Re-Evaluation 08/03/23    PT Start Time 1046    PT Stop Time 1200    PT Time Calculation (min) 74 min    Activity Tolerance Patient tolerated treatment well    Behavior During Therapy WFL for tasks assessed/performed              Past Medical History:  Diagnosis Date   Anemia    Anginal pain (HCC)    upon exertion   Arthritis    "left shoulder, lower back" (10/20/2016)   CLL (chronic lymphoblastic leukemia) dx'd 2000   Edema leg    Family history of adverse reaction to anesthesia    "daughter's BP drops"    GERD (gastroesophageal reflux disease)    History of hiatal hernia    Leukemia, chronic lymphoid (HCC) 12/08/2010   Lymphocytosis    Type II diabetes mellitus (HCC)    Past Surgical History:  Procedure Laterality Date   BOWEL RESECTION N/A 11/15/2017   Procedure: SMALL BOWEL RESECTION;  Surgeon: Oza Blumenthal, MD;  Location: [facility] OR;  Service: General;  Laterality: N/A;   CATARACT EXTRACTION W/ INTRAOCULAR LENS  IMPLANT, BILATERAL Bilateral    EYE SURGERY     Cataracts (bilateral)   GROIN DISSECTION Left 11/15/2017   Procedure: GROIN EXPLORATION WITH REMOVAL OF INGUINAL HERNIA MESH;  Surgeon: Oza Blumenthal, MD;  Location: [facility] OR;  Service: General;  Laterality: Left;   INGUINAL HERNIA REPAIR Left 11/10/2017   Procedure: LEFT INGUINAL HERNIA REPAIR ERAS PATHWAY;  Surgeon: Oza Blumenthal, MD;  Location: [facility] OR;  Service: General;  Laterality: Left;   INGUINAL HERNIA REPAIR Left 11/15/2017   Procedure: LEFT INGUINAL HERNIA REPAIR WITH MESH;  Surgeon: Oza Blumenthal, MD;  Location: [facility] OR;  Service: General;  Laterality: Left;   INSERTION OF MESH Left 11/10/2017   Procedure: INSERTION  OF MESH;  Surgeon: Oza Blumenthal, MD;  Location: [facility] OR;  Service: General;  Laterality: Left;   Patient Active Problem List   Diagnosis Date Noted   Iron  deficiency anemia 02/20/2022   Acute on chronic diastolic CHF (congestive heart failure) (HCC) 02/19/2022   Maxillary fracture (HCC) 02/19/2022   Syncope and collapse 02/19/2022   Stage 3a chronic kidney disease (CKD) (HCC) 02/19/2022   Obesity (BMI 30-39.9) 02/19/2022   Superficial retinal hemorrhage of right eye 07/16/2021   Stable treated proliferative diabetic retinopathy of right eye determined by examination associated with type 2 diabetes mellitus (HCC) 10/23/2019   Proliferative diabetic retinopathy of left eye (HCC) 10/23/2019   Chronic kidney disease, stage III (moderate) (HCC) 09/12/2019   Type II diabetes mellitus (HCC)    Acute cystitis without hematuria    Malnutrition of moderate degree 12/21/2017   Hypotension 12/14/2017   Hyponatremia 12/14/2017   ARF (acute renal failure) (HCC) 12/14/2017   Pressure ulcer 11/22/2017   Ileus (HCC) 11/14/2017   S/P left inguinal hernia repair 11/10/2017   Weight loss, non-intentional 04/12/2017   Essential hypertension 10/20/2016   Paresthesias 10/19/2016   Left inguinal hernia 10/12/2016   Protein-calorie malnutrition, mild (HCC) 01/10/2016   Diarrhea due to drug 06/17/2015   Chronic left shoulder pain 06/17/2015   Bilateral leg edema 06/17/2015   Pancytopenia, acquired (  HCC) 04/22/2015   Thrombocytopenia (HCC) 10/11/2014   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   CLL (chronic lymphocytic leukemia) (HCC) 12/08/2010    PCP: [name], DO  REFERRING PROVIDER:  [name], MD  REFERRING DIAG: C91.10 (ICD-10-CM) - CLL (chronic lymphocytic leukemia) (HCC) R60.0 (ICD-10-CM) - Bilateral leg edema  THERAPY DIAG:  Lymphedema, not elsewhere classified  Difficulty in walking, not elsewhere classified  Leukemia, lymphocytic, chronic (HCC)  ONSET DATE:  12/27/22  Rationale for Evaluation and Treatment: Rehabilitation  SUBJECTIVE:                                                                                                                                                                                           SUBJECTIVE STATEMENT: I have had it off since this morning.    PERTINENT HISTORY: CLL (chronic lymphocytic leukemia) (HCC) diagnosed on 04/03/2008. 04/29/2015 - 12/01/2021 Chemotherapy  04/29/2015: Ct scan showed mild lymphadenopathy and splenomegaly 01/08/2016: response to therapy.  06/11/23: Findings raise concern for possible recurrence of CLL - enlarged spleen and lymph nodes - starting new chemotherapy pill 06/27/23  PAIN:  Are you having pain? No, legs just feel sore now PRECAUTIONS: None  RED FLAGS: None   WEIGHT BEARING RESTRICTIONS: No  FALLS:  Has patient fallen in last 6 months? 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  COGNITION: Overall cognitive status: Within functional limits for tasks assessed   PALPATION: Fibrosis present in bilateral LEs  OBSERVATIONS / OTHER ASSESSMENTS: papillomas and hyperkeratosis present    Media Information  Document Information EVAL    07/02/23  POSTURE: forward head, rounded shoulders   LYMPHEDEMA ASSESSMENTS:   LOWER EXTREMITY LANDMARK RIGHT eval 06/18/23 06/25/23 07/02/23  At groin      30 cm proximal to suprapatella      20 cm proximal  to suprapatella 59.5     10 cm proximal to suprapatella 56.1 56.8 55.4 56.5  At midpatella / popliteal crease 47 48 42.4 46  40 cm proximal to floor at lateral plantar foot 55.8  46.6 45  30 cm proximal to floor at lateral  plantar foot 53.8 49.3 47.3 45.3  20 cm proximal to floor at lateral plantar foot 46.9 44.9 42.4 40.0  10 cm proximal to floor at lateral plantar foot 38 34 31.4 31.2  Circumference of ankle/heel      5 cm proximal to 1st MTP joint 31.5 28.5 27.7 26.5  Across MTP joint 29.9 27.9 27.8 26.5  Around proximal great toe 12 11.0 11.3 10.2  (Blank rows = not tested)  LOWER EXTREMITY LANDMARK LEFT eval  At groin   30 cm proximal to suprapatella   20 cm proximal to suprapatella 57.5  10 cm proximal to suprapatella 55.9  At midpatella / popliteal crease 42.5  40 cm proximal to floor at lateral plantar foot 50.5  30 cm proximal to floor at lateral plantar foot 50  20 cm proximal to floor at lateral plantar foot 42  10 cm proximal to floor at lateral plantar foot 34.2  Circumference of ankle/heel   5 cm proximal to 1st MTP joint 30.4  Across MTP joint 30.1  Around proximal great toe 12.5  (Blank rows = not tested)  LLIS:  Flowsheet Row Outpatient Rehab from 06/08/2023 in [facility]  Lymphedema Life Impact Scale Total Score 72.06 %                                                 TREATMENT DATE:  07/05/23: Manual Therapy Removed bandages and washed leg with soap and water. Remeasured and discussed a velcro - measured for this and will enter into abilico   MLD to Rt LE  as follows: Short neck, superficial and deep abdominals, Rt axillary and pectoral nodes, Rt inguino-axillary anastomosis, then Rt LE working from proximal to distal and retracing all steps,  then retracing all steps back to anastomosis.  Compression Bandaging: Cocoa butter applied to Rt lower leg and foot/toes, cut new TG soft size large from foot to knee, Molelast to toes 1-4,  artiflex to foot/ankle, 1/2" gray foam ant and post lower leg fixated with artiflex, then short stretch compression bandages as follows: 1-6 cm to foot/ankle with 1/2" gray foam to dorsal foot/lateral malleolous and wagon wheel to medial malleolous, then 1-8 cm ASH pattern, and 2-12 cm from ankle to below knee. Folded TG soft over top of bandage.  06/30/23: Manual Therapy Removed bandages and washed leg with soap and water.   MLD to Rt LE  as follows: Short neck, superficial and deep abdominals, Rt axillary and pectoral nodes, Rt inguino-axillary anastomosis, then Rt LE working from proximal to distal and retracing all steps,  then retracing all steps back to anastomosis.  Compression Bandaging: Cocoa butter applied to Rt lower leg and foot/toes, cut new TG soft size large from foot to knee, Molelast to toes 1-4, artiflex to foot/ankle, 1/2" gray foam ant and post lower leg fixated with artiflex, then short stretch compression bandages as follows: 1-6 cm to foot/ankle with 1/2" gray foam to dorsal foot/lateral malleolous and wagon wheel to medial malleolous, then 1-8 cm ASH pattern, and 2-12 cm from ankle to below knee. Folded TG soft over top of bandage.  06/28/23: Manual Therapy MLD to Rt LE  as follows: Short neck, superficial and deep abdominals, Rt axillary and pectoral nodes, Rt inguino-axillary anastomosis, then Rt LE working from proximal to distal and retracing all steps, avoided area  of hyperkeratosis as no elasticity in skin here so focused on areas of lower leg (just lateral today) where some skin stretch available, then retracing all steps back to anastomosis.  Compression Bandaging: Cocoa butter applied to Rt lower leg and foot/toes, cut new TG soft size large from foot to knee, Molelast to toes 1-4, artiflex to foot/ankle, 1/2" gray foam ant and post lower leg fixated with artiflex, then short stretch compression bandages as follows: 1-6 cm to foot/ankle with 1/2" gray foam to dorsal  foot/lateral malleolous and wagon wheel to medial malleolous, then 1-8 cm ASH pattern, and 2-12 cm from ankle to below knee. Folded TG soft over top of bandage.   06/25/23: Manual Therapy Removed bandages and washed leg with soap and water. Then re measured circumference. Pt conts with good reductions.  MLD to Rt LE  as follows: Short neck, superficial and deep abdominals, Rt axillary and pectoral nodes, Rt inguino-axillary anastomosis, then Rt LE working from proximal to distal and retracing all steps, avoided area of hyperkeratosis as no elasticity in skin here so focused on areas of lower leg (just lateral today) where some skin stretch available, then retracing all steps back to anastomosis.  Compression Bandaging: Cocoa butter applied to Rt lower leg and foot/toes, cut new TG soft size large from foot to knee, Molelast to toes 1-4, artiflex to foot/ankle, 1/2" gray foam ant and post lower leg fixated with artiflex, then short stretch compression bandages as follows: 1-6 cm to foot/ankle with 1/2" gray foam to dorsal foot/lateral malleolous and wagon wheel to medial malleolous, then 1-8 cm ASH pattern, and 2-12 cm from ankle to below knee. Folded TG soft over top of bandage.  Made and issued chip pack for pt to wear against genitals where pt reports lymphedema still presents and fluctuates. He has noticed some improvement but reports this is not consistent. He was instructed to wear this on the outside of his underwear and then wear a second pair over this for increased compression and to hold chip pack in place. Pt verbalized good understanding.    PATIENT EDUCATION:  Education details: education on lymphedema, anatomy and physiology of the lymphatic system, complete CDT and eventual needs for compression garments, educated about need for post op shoe Person educated: Patient and Child(ren) Education method: Explanation Education comprehension: verbalized understanding  HOME EXERCISE  PROGRAM: Obtain post op shoe to begin bandaging  ASSESSMENT:  CLINICAL IMPRESSION: Continued CDT.  Left LE is now smaller than the Rt.  Pt will benefit from a velcro wrap garment instead of a regular stocking due to the inability to even donn his regular socks.  He will fit in a juzo leg wrap in a 2XL long and a L Reg foot piece.  He will most likely need to get more than 1 but will start with one to see if he would like to order additional products.  He has stage 3 most likely primary lymphedema with skin changes, papillomas, and dry flaky skin.    OBJECTIVE IMPAIRMENTS: decreased knowledge of condition, decreased knowledge of use of DME, difficulty walking, decreased ROM, increased edema, and pain.   ACTIVITY LIMITATIONS: bending, sitting, standing, squatting, sleeping, stairs, and transfers  PARTICIPATION LIMITATIONS: meal prep, cleaning, laundry, community activity, and yard work  PERSONAL FACTORS: Age, Fitness, Past/current experiences, and 1 comorbidity: kidney disease are also affecting patient's functional outcome.   REHAB POTENTIAL: Good  CLINICAL DECISION MAKING: Evolving/moderate complexity  EVALUATION COMPLEXITY: Moderate   GOALS: Goals reviewed with  patient? Yes  SHORT TERM GOALS: Target date: 07/06/23  Pt will demonstrate a 4 cm reduction in circumferences 30 cm superior to floor at lateral foot bilaterally to decrease risk of infection. Baseline: Goal status: INITIAL  2.  Pt will report a 25% improvement in pain in bilateral LEs to allow improved comfort.  Baseline:  Goal status: INITIAL    LONG TERM GOALS: Target date: 08/03/23  Pt will demonstrate maximal circumferential reductions as evidenced by no further significant reductions in circumferences to allow pt to be measured for compression garments.  Baseline:  Goal status: INITIAL  2.  Pt will obtain appropriate compression garments for day and night for long term management of lymphedema. Baseline:  Goal  status: INITIAL  3.  Pt will benefit from a trial of a compression pump for long term management of lymphedema. Baseline:  Goal status: INITIAL  4.  Pt and/or daughter will be independent in self MLD for long term management of lymphedema. Baseline:  Goal status: INITIAL   PLAN:  PT FREQUENCY: 3x/week  PT DURATION: 8 weeks  PLANNED INTERVENTIONS: [id number]- PT Re-evaluation, 97110-Therapeutic exercises, 97530- Therapeutic activity, 97535- Self Care, 16109- Manual therapy, 97760- Orthotic Initial, [phone]- Orthotic/Prosthetic subsequent, Patient/Family education, Therapeutic exercises, Therapeutic activity, Neuromuscular re-education, and Self Care  PLAN FOR NEXT SESSION:  Cont complete CDT of Rt LE. Cont to monitor urinary output d/t kidney disease - once maximal reduction gained on Rt and garments are obtained then begin on Lt leg   Encarnacion Harris, PT 07/05/2023, 8:48 AM

## 2023-07-05 ENCOUNTER — Encounter: Payer: Self-pay | Admitting: Rehabilitation

## 2023-07-05 ENCOUNTER — Ambulatory Visit: Admitting: Rehabilitation

## 2023-07-05 DIAGNOSIS — I890 Lymphedema, not elsewhere classified: Secondary | ICD-10-CM | POA: Diagnosis not present

## 2023-07-05 DIAGNOSIS — R262 Difficulty in walking, not elsewhere classified: Secondary | ICD-10-CM

## 2023-07-05 DIAGNOSIS — C9110 Chronic lymphocytic leukemia of B-cell type not having achieved remission: Secondary | ICD-10-CM

## 2023-07-05 NOTE — Therapy (Signed)
 OUTPATIENT PHYSICAL THERAPY  LOWER EXTREMITY ONCOLOGY TREATMENT  Patient Name: Michael [name] MRN: [id number] DOB:09-23-1930, 89 y.o., male Today's Date: 07/05/2023  END OF SESSION:  PT End of Session - 07/05/23 1623     Visit Number 10    Number of Visits 25    Date for PT Re-Evaluation 08/03/23    PT Start Time 1400    PT Stop Time 1453    PT Time Calculation (min) 53 min    Activity Tolerance Patient tolerated treatment well    Behavior During Therapy [facility] for tasks assessed/performed              Past Medical History:  Diagnosis Date   Anemia    Anginal pain (HCC)    upon exertion   Arthritis    "left shoulder, lower back" (10/20/2016)   CLL (chronic lymphoblastic leukemia) dx'd 2000   Edema leg    Family history of adverse reaction to anesthesia    "daughter's BP drops"    GERD (gastroesophageal reflux disease)    History of hiatal hernia    Leukemia, chronic lymphoid (HCC) 12/08/2010   Lymphocytosis    Type II diabetes mellitus (HCC)    Past Surgical History:  Procedure Laterality Date   BOWEL RESECTION N/A 11/15/2017   Procedure: SMALL BOWEL RESECTION;  Surgeon: Oza Blumenthal, MD;  Location: [facility] OR;  Service: General;  Laterality: N/A;   CATARACT EXTRACTION W/ INTRAOCULAR LENS  IMPLANT, BILATERAL Bilateral    EYE SURGERY     Cataracts (bilateral)   GROIN DISSECTION Left 11/15/2017   Procedure: GROIN EXPLORATION WITH REMOVAL OF INGUINAL HERNIA MESH;  Surgeon: Oza Blumenthal, MD;  Location: [facility] OR;  Service: General;  Laterality: Left;   INGUINAL HERNIA REPAIR Left 11/10/2017   Procedure: LEFT INGUINAL HERNIA REPAIR ERAS PATHWAY;  Surgeon: Oza Blumenthal, MD;  Location: [facility] OR;  Service: General;  Laterality: Left;   INGUINAL HERNIA REPAIR Left 11/15/2017   Procedure: LEFT INGUINAL HERNIA REPAIR WITH MESH;  Surgeon: Oza Blumenthal, MD;  Location: [facility] OR;  Service: General;  Laterality: Left;   INSERTION OF MESH Left 11/10/2017   Procedure:  INSERTION OF MESH;  Surgeon: Oza Blumenthal, MD;  Location: [facility] OR;  Service: General;  Laterality: Left;   Patient Active Problem List   Diagnosis Date Noted   Iron  deficiency anemia 02/20/2022   Acute on chronic diastolic CHF (congestive heart failure) (HCC) 02/19/2022   Maxillary fracture (HCC) 02/19/2022   Syncope and collapse 02/19/2022   Stage 3a chronic kidney disease (CKD) (HCC) 02/19/2022   Obesity (BMI 30-39.9) 02/19/2022   Superficial retinal hemorrhage of right eye 07/16/2021   Stable treated proliferative diabetic retinopathy of right eye determined by examination associated with type 2 diabetes mellitus (HCC) 10/23/2019   Proliferative diabetic retinopathy of left eye (HCC) 10/23/2019   Chronic kidney disease, stage III (moderate) (HCC) 09/12/2019   Type II diabetes mellitus (HCC)    Acute cystitis without hematuria    Malnutrition of moderate degree 12/21/2017   Hypotension 12/14/2017   Hyponatremia 12/14/2017   ARF (acute renal failure) (HCC) 12/14/2017   Pressure ulcer 11/22/2017   Ileus (HCC) 11/14/2017   S/P left inguinal hernia repair 11/10/2017   Weight loss, non-intentional 04/12/2017   Essential hypertension 10/20/2016   Paresthesias 10/19/2016   Left inguinal hernia 10/12/2016   Protein-calorie malnutrition, mild (HCC) 01/10/2016   Diarrhea due to drug 06/17/2015   Chronic left shoulder pain 06/17/2015   Bilateral leg edema 06/17/2015   Pancytopenia, acquired (  HCC) 04/22/2015   Thrombocytopenia (HCC) 10/11/2014   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   CLL (chronic lymphocytic leukemia) (HCC) 12/08/2010    PCP: [name], DO  REFERRING PROVIDER:  [name], MD  REFERRING DIAG: C91.10 (ICD-10-CM) - CLL (chronic lymphocytic leukemia) (HCC) R60.0 (ICD-10-CM) - Bilateral leg edema  THERAPY DIAG:  Lymphedema, not elsewhere classified  Difficulty in walking, not elsewhere classified  Leukemia, lymphocytic, chronic  (HCC)  ONSET DATE: 12/27/22  Rationale for Evaluation and Treatment: Rehabilitation  SUBJECTIVE:                                                                                                                                                                                           SUBJECTIVE STATEMENT: I have had it off since this morning.    PERTINENT HISTORY: CLL (chronic lymphocytic leukemia) (HCC) diagnosed on 04/03/2008. 04/29/2015 - 12/01/2021 Chemotherapy  04/29/2015: Ct scan showed mild lymphadenopathy and splenomegaly 01/08/2016: response to therapy.  06/11/23: Findings raise concern for possible recurrence of CLL - enlarged spleen and lymph nodes - starting new chemotherapy pill 06/27/23  PAIN:  Are you having pain? No, legs just feel sore now PRECAUTIONS: None  RED FLAGS: None   WEIGHT BEARING RESTRICTIONS: No  FALLS:  Has patient fallen in last 6 months? 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  COGNITION: Overall cognitive status: Within functional limits for tasks assessed   PALPATION: Fibrosis present in bilateral LEs  OBSERVATIONS / OTHER ASSESSMENTS: papillomas and hyperkeratosis present    Media Information  Document Information EVAL    07/02/23  POSTURE: forward head, rounded shoulders   LYMPHEDEMA ASSESSMENTS:   LOWER EXTREMITY LANDMARK RIGHT eval 06/18/23 06/25/23 07/02/23  At groin      30 cm proximal to suprapatella       20 cm proximal to suprapatella 59.5     10 cm proximal to suprapatella 56.1 56.8 55.4 56.5  At midpatella / popliteal crease 47 48 42.4 46  40 cm proximal to floor at lateral plantar foot 55.8  46.6 45  30 cm proximal to floor at lateral  plantar foot 53.8 49.3 47.3 45.3  20 cm proximal to floor at lateral plantar foot 46.9 44.9 42.4 40.0  10 cm proximal to floor at lateral plantar foot 38 34 31.4 31.2  Circumference of ankle/heel      5 cm proximal to 1st MTP joint 31.5 28.5 27.7 26.5  Across MTP joint 29.9 27.9 27.8 26.5  Around proximal great toe 12 11.0 11.3 10.2  (Blank rows = not tested)  LOWER EXTREMITY LANDMARK LEFT eval  At groin   30 cm proximal to suprapatella   20 cm proximal to suprapatella 57.5  10 cm proximal to suprapatella 55.9  At midpatella / popliteal crease 42.5  40 cm proximal to floor at lateral plantar foot 50.5  30 cm proximal to floor at lateral plantar foot 50  20 cm proximal to floor at lateral plantar foot 42  10 cm proximal to floor at lateral plantar foot 34.2  Circumference of ankle/heel   5 cm proximal to 1st MTP joint 30.4  Across MTP joint 30.1  Around proximal great toe 12.5  (Blank rows = not tested)  LLIS:  Flowsheet Row Outpatient Rehab from 06/08/2023 in [facility]  Lymphedema Life Impact Scale Total Score 72.06 %                                                 TREATMENT DATE:  07/05/23: Manual Therapy Removed bandages and washed leg with soap and water. Remeasured and discussed a velcro - measured for this and will enter into abilico   MLD to Rt LE  as follows: Short neck, superficial and deep abdominals, Rt axillary and pectoral nodes, Rt inguino-axillary anastomosis, then Rt LE working from proximal to distal and retracing all steps,  then retracing all steps back to anastomosis.  Compression Bandaging: Cocoa butter applied to Rt lower leg and foot/toes, cut new TG soft size large from foot to knee,  Molelast to toes 1-4, artiflex to foot/ankle, 1/2" gray foam ant and post lower leg fixated with artiflex, then short stretch compression bandages as follows: 1-6 cm to foot/ankle with 1/2" gray foam to dorsal foot/lateral malleolous and wagon wheel to medial malleolous, then 1-8 cm ASH pattern, and 2-12 cm from ankle to below knee. Folded TG soft over top of bandage.  06/30/23: Manual Therapy Removed bandages and washed leg with soap and water.   MLD to Rt LE  as follows: Short neck, superficial and deep abdominals, Rt axillary and pectoral nodes, Rt inguino-axillary anastomosis, then Rt LE working from proximal to distal and retracing all steps,  then retracing all steps back to anastomosis.  Compression Bandaging: Cocoa butter applied to Rt lower leg and foot/toes, cut new TG soft size large from foot to knee, Molelast to toes 1-4, artiflex to foot/ankle, 1/2" gray foam ant and post lower leg fixated with artiflex, then short stretch compression bandages as follows: 1-6 cm to foot/ankle with 1/2" gray foam to dorsal foot/lateral malleolous and wagon wheel to medial malleolous, then 1-8 cm ASH pattern, and 2-12 cm from ankle to below knee. Folded TG soft over top of bandage.  06/28/23: Manual Therapy MLD to Rt LE  as follows: Short neck, superficial and deep abdominals, Rt axillary and pectoral nodes, Rt inguino-axillary anastomosis, then Rt LE working from proximal to distal and retracing all steps, avoided area  of hyperkeratosis as no elasticity in skin here so focused on areas of lower leg (just lateral today) where some skin stretch available, then retracing all steps back to anastomosis.  Compression Bandaging: Cocoa butter applied to Rt lower leg and foot/toes, cut new TG soft size large from foot to knee, Molelast to toes 1-4, artiflex to foot/ankle, 1/2" gray foam ant and post lower leg fixated with artiflex, then short stretch compression bandages as follows: 1-6 cm to foot/ankle with 1/2" gray  foam to dorsal foot/lateral malleolous and wagon wheel to medial malleolous, then 1-8 cm ASH pattern, and 2-12 cm from ankle to below knee. Folded TG soft over top of bandage.   06/25/23: Manual Therapy Removed bandages and washed leg with soap and water. Then re measured circumference. Pt conts with good reductions.  MLD to Rt LE  as follows: Short neck, superficial and deep abdominals, Rt axillary and pectoral nodes, Rt inguino-axillary anastomosis, then Rt LE working from proximal to distal and retracing all steps, avoided area of hyperkeratosis as no elasticity in skin here so focused on areas of lower leg (just lateral today) where some skin stretch available, then retracing all steps back to anastomosis.  Compression Bandaging: Cocoa butter applied to Rt lower leg and foot/toes, cut new TG soft size large from foot to knee, Molelast to toes 1-4, artiflex to foot/ankle, 1/2" gray foam ant and post lower leg fixated with artiflex, then short stretch compression bandages as follows: 1-6 cm to foot/ankle with 1/2" gray foam to dorsal foot/lateral malleolous and wagon wheel to medial malleolous, then 1-8 cm ASH pattern, and 2-12 cm from ankle to below knee. Folded TG soft over top of bandage.  Made and issued chip pack for pt to wear against genitals where pt reports lymphedema still presents and fluctuates. He has noticed some improvement but reports this is not consistent. He was instructed to wear this on the outside of his underwear and then wear a second pair over this for increased compression and to hold chip pack in place. Pt verbalized good understanding.    PATIENT EDUCATION:  Education details: education on lymphedema, anatomy and physiology of the lymphatic system, complete CDT and eventual needs for compression garments, educated about need for post op shoe Person educated: Patient and Child(ren) Education method: Explanation Education comprehension: verbalized understanding  HOME  EXERCISE PROGRAM: Obtain post op shoe to begin bandaging  ASSESSMENT:  CLINICAL IMPRESSION: Continued CDT.    OBJECTIVE IMPAIRMENTS: decreased knowledge of condition, decreased knowledge of use of DME, difficulty walking, decreased ROM, increased edema, and pain.   ACTIVITY LIMITATIONS: bending, sitting, standing, squatting, sleeping, stairs, and transfers  PARTICIPATION LIMITATIONS: meal prep, cleaning, laundry, community activity, and yard work  PERSONAL FACTORS: Age, Fitness, Past/current experiences, and 1 comorbidity: kidney disease are also affecting patient's functional outcome.   REHAB POTENTIAL: Good  CLINICAL DECISION MAKING: Evolving/moderate complexity  EVALUATION COMPLEXITY: Moderate   GOALS: Goals reviewed with patient? Yes  SHORT TERM GOALS: Target date: 07/06/23  Pt will demonstrate a 4 cm reduction in circumferences 30 cm superior to floor at lateral foot bilaterally to decrease risk of infection. Baseline: Goal status: INITIAL  2.  Pt will report a 25% improvement in pain in bilateral LEs to allow improved comfort.  Baseline:  Goal status: INITIAL    LONG TERM GOALS: Target date: 08/03/23  Pt will demonstrate maximal circumferential reductions as evidenced by no further significant reductions in circumferences to allow pt to be measured for compression garments.  Baseline:  Goal status: INITIAL  2.  Pt will obtain appropriate compression garments for day and night for long term management of lymphedema. Baseline:  Goal status: INITIAL  3.  Pt will benefit from a trial of a compression pump for long term management of lymphedema. Baseline:  Goal status: INITIAL  4.  Pt and/or daughter will be independent in self MLD for long term management of lymphedema. Baseline:  Goal status: INITIAL   PLAN:  PT FREQUENCY: 3x/week  PT DURATION: 8 weeks  PLANNED INTERVENTIONS: [id number]- PT Re-evaluation, 97110-Therapeutic exercises, 97530- Therapeutic  activity, 97535- Self Care, [id number]- Manual therapy, 97760- Orthotic Initial, [phone]- Orthotic/Prosthetic subsequent, Patient/Family education, Therapeutic exercises, Therapeutic activity, Neuromuscular re-education, and Self Care  PLAN FOR NEXT SESSION:  Cont complete CDT of Rt LE. Cont to monitor urinary output d/t kidney disease - once maximal reduction gained on Rt and garments are obtained then begin on Lt leg   Encarnacion Harris, PT 07/05/2023, 4:25 PM

## 2023-07-06 ENCOUNTER — Encounter: Payer: Self-pay | Admitting: Hematology and Oncology

## 2023-07-06 ENCOUNTER — Inpatient Hospital Stay: Admitting: Hematology and Oncology

## 2023-07-06 ENCOUNTER — Inpatient Hospital Stay: Attending: Hematology and Oncology

## 2023-07-06 VITALS — BP 121/57 | HR 67 | Resp 18 | Ht 67.0 in | Wt 202.3 lb

## 2023-07-06 DIAGNOSIS — R161 Splenomegaly, not elsewhere classified: Secondary | ICD-10-CM | POA: Insufficient documentation

## 2023-07-06 DIAGNOSIS — D61818 Other pancytopenia: Secondary | ICD-10-CM | POA: Diagnosis not present

## 2023-07-06 DIAGNOSIS — C9110 Chronic lymphocytic leukemia of B-cell type not having achieved remission: Secondary | ICD-10-CM | POA: Insufficient documentation

## 2023-07-06 LAB — COMPREHENSIVE METABOLIC PANEL WITH GFR
ALT: 5 U/L (ref 0–44)
AST: 13 U/L — ABNORMAL LOW (ref 15–41)
Albumin: 4 g/dL (ref 3.5–5.0)
Alkaline Phosphatase: 49 U/L (ref 38–126)
Anion gap: 6 (ref 5–15)
BUN: 25 mg/dL — ABNORMAL HIGH (ref 8–23)
CO2: 26 mmol/L (ref 22–32)
Calcium: 8.2 mg/dL — ABNORMAL LOW (ref 8.9–10.3)
Chloride: 106 mmol/L (ref 98–111)
Creatinine, Ser: 1.35 mg/dL — ABNORMAL HIGH (ref 0.61–1.24)
GFR, Estimated: 50 mL/min — ABNORMAL LOW
Glucose, Bld: 157 mg/dL — ABNORMAL HIGH (ref 70–99)
Potassium: 4.2 mmol/L (ref 3.5–5.1)
Sodium: 138 mmol/L (ref 135–145)
Total Bilirubin: 0.5 mg/dL (ref 0.0–1.2)
Total Protein: 6.7 g/dL (ref 6.5–8.1)

## 2023-07-06 LAB — CBC WITH DIFFERENTIAL/PLATELET
Abs Immature Granulocytes: 0.06 10*3/uL (ref 0.00–0.07)
Basophils Absolute: 0.1 10*3/uL (ref 0.0–0.1)
Basophils Relative: 1 %
Eosinophils Absolute: 0.1 10*3/uL (ref 0.0–0.5)
Eosinophils Relative: 1 %
HCT: 30.2 % — ABNORMAL LOW (ref 39.0–52.0)
Hemoglobin: 10.3 g/dL — ABNORMAL LOW (ref 13.0–17.0)
Immature Granulocytes: 1 %
Lymphocytes Relative: 76 %
Lymphs Abs: 7.6 10*3/uL — ABNORMAL HIGH (ref 0.7–4.0)
MCH: 31.4 pg (ref 26.0–34.0)
MCHC: 34.1 g/dL (ref 30.0–36.0)
MCV: 92.1 fL (ref 80.0–100.0)
Monocytes Absolute: 0.1 10*3/uL (ref 0.1–1.0)
Monocytes Relative: 1 %
Neutro Abs: 2 10*3/uL (ref 1.7–7.7)
Neutrophils Relative %: 20 %
Platelets: 69 10*3/uL — ABNORMAL LOW (ref 150–400)
RBC: 3.28 MIL/uL — ABNORMAL LOW (ref 4.22–5.81)
RDW: 15.4 % (ref 11.5–15.5)
Smear Review: NORMAL
WBC: 10 10*3/uL (ref 4.0–10.5)
nRBC: 0 % (ref 0.0–0.2)

## 2023-07-06 NOTE — Progress Notes (Signed)
 [facility] OFFICE PROGRESS NOTE  Patient Care Team: [name], DO as PCP - General (Family Medicine) [name], MD as Consulting Physician (Hematology and Oncology)  Assessment & Plan CLL (chronic lymphocytic leukemia) [facility]) The patient was diagnosed with CLL in 2010, Rai stage II with lymphadenopathy and splenomegaly, FISH analysis showed trisomy 12 He received ibrutinib  between April 2017 to November 2023  CT imaging from May showed progressive splenomegaly and lymphadenopathy Due to symptomatic pancytopenia, we made decision to resume treatment with Calquence  on June 1 Overall, he tolerated treatment well He has lost weight but protein level has improved The only side effect is mild worsening pancytopenia Will proceed with treatment as prescribed and I will see him again next week for further follow-up Pancytopenia, 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 The patient denies recent history of bleeding such as epistaxis, hematuria or hematochezia. He is asymptomatic from the anemia & low platelets.   Will continue treatment and I plan to see him again next week for further follow-up    No orders of the defined types were placed in this encounter.    [name], MD  INTERVAL HISTORY: he returns for treatment follow-up Complications related to previous cycle of chemotherapy included pancytopenia,, weight loss,, and persistent leg swelling Since last time I saw him, he has lost almost 17 pounds He felt that the leg bandages are helping him He denies side effects from treatment The patient denies any recent signs or symptoms of bleeding such as spontaneous epistaxis, hematuria or hematochezia.  PHYSICAL EXAMINATION: ECOG PERFORMANCE STATUS: 2 - Symptomatic, <50% confined to bed  No results found for: "CAN125"    Latest Ref Rng & Units 07/06/2023    1:10 PM 06/11/2023   12:07 AM 06/03/2023   10:16 AM  CBC  WBC 4.0 - 10.5 K/uL 10.0   12.5  15.1   Hemoglobin 13.0 - 17.0 g/dL 91.4  9.6  78.2   Hematocrit 39.0 - 52.0 % 30.2  29.9  30.2   Platelets 150 - 400 K/uL 69  74  94       Chemistry      Component Value Date/Time   NA 138 07/06/2023 1310   NA 140 10/09/2016 1106   K 4.2 07/06/2023 1310   K 4.7 10/09/2016 1106   CL 106 07/06/2023 1310   CL 104 03/28/2012 1426   CO2 26 07/06/2023 1310   CO2 28 10/09/2016 1106   BUN 25 (H) 07/06/2023 1310   BUN 20.8 10/09/2016 1106   CREATININE 1.35 (H) 07/06/2023 1310   CREATININE 1.30 (H) 06/02/2022 1123   CREATININE 1.1 10/09/2016 1106      Component Value Date/Time   CALCIUM  8.2 (L) 07/06/2023 1310   CALCIUM  9.1 10/09/2016 1106   ALKPHOS 49 07/06/2023 1310   ALKPHOS 46 10/09/2016 1106   AST 13 (L) 07/06/2023 1310   AST 16 06/02/2022 1123   AST 17 10/09/2016 1106   ALT 5 07/06/2023 1310   ALT 8 06/02/2022 1123   ALT 8 10/09/2016 1106   BILITOT 0.5 07/06/2023 1310   BILITOT 0.5 06/02/2022 1123   BILITOT 0.23 10/09/2016 1106       Vitals:   07/06/23 1335  BP: (!) 121/57  Pulse: 67  Resp: 18  SpO2: 99%   Filed Weights   07/06/23 1335  Weight: 202 lb 4.8 oz (91.8 kg)   Other relevant data reviewed during this visit included CBC and CMP

## 2023-07-06 NOTE — Assessment & Plan Note (Addendum)
 The patient was diagnosed with CLL in 2010, Rai stage II with lymphadenopathy and splenomegaly, FISH analysis showed trisomy 17 He received ibrutinib  between April 2017 to November 2023  CT imaging from May showed progressive splenomegaly and lymphadenopathy Due to symptomatic pancytopenia, we made decision to resume treatment with Calquence  on June 1 Overall, he tolerated treatment well He has lost weight but protein level has improved The only side effect is mild worsening pancytopenia Will proceed with treatment as prescribed and I will see him again next week for further follow-up

## 2023-07-06 NOTE — Assessment & Plan Note (Addendum)
 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 The patient denies recent history of bleeding such as epistaxis, hematuria or hematochezia. He is asymptomatic from the anemia & low platelets.   Will continue treatment and I plan to see him again next week for further follow-up

## 2023-07-07 ENCOUNTER — Telehealth: Payer: Self-pay | Admitting: Hematology and Oncology

## 2023-07-07 ENCOUNTER — Ambulatory Visit: Admitting: Rehabilitation

## 2023-07-07 DIAGNOSIS — C9110 Chronic lymphocytic leukemia of B-cell type not having achieved remission: Secondary | ICD-10-CM

## 2023-07-07 DIAGNOSIS — I890 Lymphedema, not elsewhere classified: Secondary | ICD-10-CM

## 2023-07-07 DIAGNOSIS — R262 Difficulty in walking, not elsewhere classified: Secondary | ICD-10-CM

## 2023-07-07 NOTE — Therapy (Signed)
 OUTPATIENT PHYSICAL THERAPY  LOWER EXTREMITY ONCOLOGY TREATMENT  Patient Name: Michael [name] MRN: [id number] DOB:[birth year], 89 y.o., male Today's Date: 07/07/2023  END OF SESSION:  PT End of Session - 07/07/23 1157     Visit Number 11    Number of Visits 25    Date for PT Re-Evaluation 08/03/23    PT Start Time 1200    PT Stop Time 1302    PT Time Calculation (min) 62 min    Activity Tolerance Patient tolerated treatment well    Behavior During Therapy [facility] for tasks assessed/performed              Past Medical History:  Diagnosis Date   Anemia    Anginal pain (HCC)    upon exertion   Arthritis    left shoulder, lower back (10/20/2016)   CLL (chronic lymphoblastic leukemia) dx'd 2000   Edema leg    Family history of adverse reaction to anesthesia    daughter's BP drops    GERD (gastroesophageal reflux disease)    History of hiatal hernia    Leukemia, chronic lymphoid (HCC) 12/08/2010   Lymphocytosis    Type II diabetes mellitus (HCC)    Past Surgical History:  Procedure Laterality Date   BOWEL RESECTION N/A 11/15/2017   Procedure: SMALL BOWEL RESECTION;  Surgeon: Oza Blumenthal, MD;  Location: [facility] OR;  Service: General;  Laterality: N/A;   CATARACT EXTRACTION W/ INTRAOCULAR LENS  IMPLANT, BILATERAL Bilateral    EYE SURGERY     Cataracts (bilateral)   GROIN DISSECTION Left 11/15/2017   Procedure: GROIN EXPLORATION WITH REMOVAL OF INGUINAL HERNIA MESH;  Surgeon: Oza Blumenthal, MD;  Location: [facility] OR;  Service: General;  Laterality: Left;   INGUINAL HERNIA REPAIR Left 11/10/2017   Procedure: LEFT INGUINAL HERNIA REPAIR ERAS PATHWAY;  Surgeon: Oza Blumenthal, MD;  Location: [facility] OR;  Service: General;  Laterality: Left;   INGUINAL HERNIA REPAIR Left 11/15/2017   Procedure: LEFT INGUINAL HERNIA REPAIR WITH MESH;  Surgeon: Oza Blumenthal, MD;  Location: [facility] OR;  Service: General;  Laterality: Left;   INSERTION OF MESH Left 11/10/2017   Procedure:  INSERTION OF MESH;  Surgeon: Oza Blumenthal, MD;  Location: [facility];  Service: General;  Laterality: Left;   Patient Active Problem List   Diagnosis Date Noted   Iron  deficiency anemia 02/20/2022   Acute on chronic diastolic CHF (congestive heart failure) (HCC) 02/19/2022   Maxillary fracture (HCC) 02/19/2022   Syncope and collapse 02/19/2022   Stage 3a chronic kidney disease (CKD) (HCC) 02/19/2022   Obesity (BMI 30-39.9) 02/19/2022   Superficial retinal hemorrhage of right eye 07/16/2021   Stable treated proliferative diabetic retinopathy of right eye determined by examination associated with type 2 diabetes mellitus (HCC) 10/23/2019   Proliferative diabetic retinopathy of left eye (HCC) 10/23/2019   Chronic kidney disease, stage III (moderate) (HCC) 09/12/2019   Type II diabetes mellitus (HCC)    Acute cystitis without hematuria    Malnutrition of moderate degree 12/21/2017   Hypotension 12/14/2017   Hyponatremia 12/14/2017   ARF (acute renal failure) (HCC) 12/14/2017   Pressure ulcer 11/22/2017   Ileus (HCC) 11/14/2017   S/P left inguinal hernia repair 11/10/2017   Weight loss, non-intentional 04/12/2017   Essential hypertension 10/20/2016   Paresthesias 10/19/2016   Left inguinal hernia 10/12/2016   Protein-calorie malnutrition, mild (HCC) 01/10/2016   Diarrhea due to drug 06/17/2015   Chronic left shoulder pain 06/17/2015   Bilateral leg edema 06/17/2015   Pancytopenia, acquired (  HCC) 04/22/2015   Thrombocytopenia (HCC) 10/11/2014   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   CLL (chronic lymphocytic leukemia) (HCC) 12/08/2010    PCP: [name], DO  REFERRING PROVIDER:  [name], MD  REFERRING DIAG: C91.10 (ICD-10-CM) - CLL (chronic lymphocytic leukemia) (HCC) R60.0 (ICD-10-CM) - Bilateral leg edema  THERAPY DIAG:  Lymphedema, not elsewhere classified  Difficulty in walking, not elsewhere classified  Leukemia, lymphocytic, chronic  (HCC)  ONSET DATE: 12/27/22  Rationale for Evaluation and Treatment: Rehabilitation  SUBJECTIVE:                                                                                                                                                                                           SUBJECTIVE STATEMENT:   The toe wrap got loose, but otherwise the wrap did well. Pts MD told him Monday they had sent script for compression wrap.  PERTINENT HISTORY: CLL (chronic lymphocytic leukemia) (HCC) diagnosed on 04/03/2008. 04/29/2015 - 12/01/2021 Chemotherapy  04/29/2015: Ct scan showed mild lymphadenopathy and splenomegaly 01/08/2016: response to therapy.  06/11/23: Findings raise concern for possible recurrence of CLL - enlarged spleen and lymph nodes - starting new chemotherapy pill 06/27/23  PAIN:  Are you having pain? No, legs just feel sore now PRECAUTIONS: None  RED FLAGS: None   WEIGHT BEARING RESTRICTIONS: No  FALLS:  Has patient fallen in last 6 months? 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  COGNITION: Overall cognitive status: Within functional limits for tasks assessed   PALPATION: Fibrosis present in bilateral LEs  OBSERVATIONS / OTHER ASSESSMENTS: papillomas and hyperkeratosis present    Media Information  Document Information EVAL    07/02/23  POSTURE: forward head, rounded shoulders   LYMPHEDEMA ASSESSMENTS:   LOWER EXTREMITY  LANDMARK RIGHT eval 06/18/23 06/25/23 07/02/23  At groin      30 cm proximal to suprapatella      20 cm proximal to suprapatella 59.5     10 cm proximal to suprapatella 56.1 56.8 55.4 56.5  At midpatella / popliteal crease 47 48 42.4 46  40 cm proximal to floor at  lateral plantar foot 55.8  46.6 45  30 cm proximal to floor at lateral plantar foot 53.8 49.3 47.3 45.3  20 cm proximal to floor at lateral plantar foot 46.9 44.9 42.4 40.0  10 cm proximal to floor at lateral plantar foot 38 34 31.4 31.2  Circumference of ankle/heel      5 cm proximal to 1st MTP joint 31.5 28.5 27.7 26.5  Across MTP joint 29.9 27.9 27.8 26.5  Around proximal great toe 12 11.0 11.3 10.2  (Blank rows = not tested)  LOWER EXTREMITY LANDMARK LEFT eval  At groin   30 cm proximal to suprapatella   20 cm proximal to suprapatella 57.5  10 cm proximal to suprapatella 55.9  At midpatella / popliteal crease 42.5  40 cm proximal to floor at lateral plantar foot 50.5  30 cm proximal to floor at lateral plantar foot 50  20 cm proximal to floor at lateral plantar foot 42  10 cm proximal to floor at lateral plantar foot 34.2  Circumference of ankle/heel   5 cm proximal to 1st MTP joint 30.4  Across MTP joint 30.1  Around proximal great toe 12.5  (Blank rows = not tested)  LLIS:  Flowsheet Row Outpatient Rehab from 06/08/2023 in [facility]  Lymphedema Life Impact Scale Total Score 72.06 %                                                 TREATMENT DATE:  07/07/2023 Removed bandages and washed leg with soap and water. MLD to Rt LE  as follows: Short neck, superficial and deep abdominals, Rt axillary and pectoral nodes, Rt inguino-axillary anastomosis, then Rt LE working from proximal to distal and retracing all steps,  then retracing all steps back to anastomosis.  Compression Bandaging: Cocoa butter applied to Rt lower leg and foot/toes, cut new TG soft size large from foot to knee,  Molelast to toes 1-4, artiflex to foot/ankle, 1/2 gray foam ant and post lower leg fixated with artiflex, then short stretch compression bandages as follows: 1-6 cm to foot/ankle with 1/2 gray foam to dorsal foot/lateral malleolous and wagon wheel to medial malleolous, then 1-8 cm ASH pattern, and 2-12 cm from ankle to below knee. Folded TG soft over top of bandage.   07/05/23: Manual Therapy Removed bandages and washed leg with soap and water. Remeasured and discussed a velcro - measured for this and will enter into abilico   MLD to Rt LE  as follows: Short neck, superficial and deep abdominals, Rt axillary and pectoral nodes, Rt inguino-axillary anastomosis, then Rt LE working from proximal to distal and retracing all steps,  then retracing all steps back to anastomosis.  Compression Bandaging: Cocoa butter applied to Rt lower leg and foot/toes, cut new TG soft size large from foot to knee, Molelast to toes 1-4, artiflex to foot/ankle, 1/2 gray foam ant and post lower leg fixated with artiflex, then short stretch compression bandages as follows: 1-6 cm to foot/ankle with 1/2 gray foam to dorsal foot/lateral malleolous and wagon wheel to medial malleolous, then 1-8 cm ASH pattern, and 2-12 cm from ankle to below knee. Folded TG soft over top of bandage.  06/30/23: Manual Therapy Removed bandages and washed leg with soap and water.   MLD to Rt LE  as follows: Short neck, superficial and deep  abdominals, Rt axillary and pectoral nodes, Rt inguino-axillary anastomosis, then Rt LE working from proximal to distal and retracing all steps,  then retracing all steps back to anastomosis.  Compression Bandaging: Cocoa butter applied to Rt lower leg and foot/toes, cut new TG soft size large from foot to knee, Molelast to toes 1-4, artiflex to foot/ankle, 1/2 gray foam ant and post lower leg fixated with artiflex, then short stretch compression bandages as follows: 1-6 cm to foot/ankle with 1/2 gray foam to  dorsal foot/lateral malleolous and wagon wheel to medial malleolous, then 1-8 cm ASH pattern, and 2-12 cm from ankle to below knee. Folded TG soft over top of bandage.  06/28/23: Manual Therapy MLD to Rt LE  as follows: Short neck, superficial and deep abdominals, Rt axillary and pectoral nodes, Rt inguino-axillary anastomosis, then Rt LE working from proximal to distal and retracing all steps, avoided area of hyperkeratosis as no elasticity in skin here so focused on areas of lower leg (just lateral today) where some skin stretch available, then retracing all steps back to anastomosis.  Compression Bandaging: Cocoa butter applied to Rt lower leg and foot/toes, cut new TG soft size large from foot to knee, Molelast to toes 1-4, artiflex to foot/ankle, 1/2 gray foam ant and post lower leg fixated with artiflex, then short stretch compression bandages as follows: 1-6 cm to foot/ankle with 1/2 gray foam to dorsal foot/lateral malleolous and wagon wheel to medial malleolous, then 1-8 cm ASH pattern, and 2-12 cm from ankle to below knee. Folded TG soft over top of bandage.   06/25/23: Manual Therapy Removed bandages and washed leg with soap and water. Then re measured circumference. Pt conts with good reductions.  MLD to Rt LE  as follows: Short neck, superficial and deep abdominals, Rt axillary and pectoral nodes, Rt inguino-axillary anastomosis, then Rt LE working from proximal to distal and retracing all steps, avoided area of hyperkeratosis as no elasticity in skin here so focused on areas of lower leg (just lateral today) where some skin stretch available, then retracing all steps back to anastomosis.  Compression Bandaging: Cocoa butter applied to Rt lower leg and foot/toes, cut new TG soft size large from foot to knee, Molelast to toes 1-4, artiflex to foot/ankle, 1/2 gray foam ant and post lower leg fixated with artiflex, then short stretch compression bandages as follows: 1-6 cm to foot/ankle with  1/2 gray foam to dorsal foot/lateral malleolous and wagon wheel to medial malleolous, then 1-8 cm ASH pattern, and 2-12 cm from ankle to below knee. Folded TG soft over top of bandage.  Made and issued chip pack for pt to wear against genitals where pt reports lymphedema still presents and fluctuates. He has noticed some improvement but reports this is not consistent. He was instructed to wear this on the outside of his underwear and then wear a second pair over this for increased compression and to hold chip pack in place. Pt verbalized good understanding.    PATIENT EDUCATION:  Education details: education on lymphedema, anatomy and physiology of the lymphatic system, complete CDT and eventual needs for compression garments, educated about need for post op shoe Person educated: Patient and Child(ren) Education method: Explanation Education comprehension: verbalized understanding  HOME EXERCISE PROGRAM: Obtain post op shoe to begin bandaging  ASSESSMENT:  CLINICAL IMPRESSION: Continued CDT.Aaron Aas Pts script was sent per MD.   OBJECTIVE IMPAIRMENTS: decreased knowledge of condition, decreased knowledge of use of DME, difficulty walking, decreased ROM, increased edema, and pain.  ACTIVITY LIMITATIONS: bending, sitting, standing, squatting, sleeping, stairs, and transfers  PARTICIPATION LIMITATIONS: meal prep, cleaning, laundry, community activity, and yard work  PERSONAL FACTORS: Age, Fitness, Past/current experiences, and 1 comorbidity: kidney disease are also affecting patient's functional outcome.   REHAB POTENTIAL: Good  CLINICAL DECISION MAKING: Evolving/moderate complexity  EVALUATION COMPLEXITY: Moderate   GOALS: Goals reviewed with patient? Yes  SHORT TERM GOALS: Target date: 07/06/23  Pt will demonstrate a 4 cm reduction in circumferences 30 cm superior to floor at lateral foot bilaterally to decrease risk of infection. Baseline: Goal status: INITIAL  2.  Pt will report  a 25% improvement in pain in bilateral LEs to allow improved comfort.  Baseline:  Goal status: INITIAL    LONG TERM GOALS: Target date: 08/03/23  Pt will demonstrate maximal circumferential reductions as evidenced by no further significant reductions in circumferences to allow pt to be measured for compression garments.  Baseline:  Goal status: INITIAL  2.  Pt will obtain appropriate compression garments for day and night for long term management of lymphedema. Baseline:  Goal status: INITIAL  3.  Pt will benefit from a trial of a compression pump for long term management of lymphedema. Baseline:  Goal status: INITIAL  4.  Pt and/or daughter will be independent in self MLD for long term management of lymphedema. Baseline:  Goal status: INITIAL   PLAN:  PT FREQUENCY: 3x/week  PT DURATION: 8 weeks  PLANNED INTERVENTIONS: [id number]- PT Re-evaluation, 97110-Therapeutic exercises, 97530- Therapeutic activity, 97535- Self Care, [id number]- Manual therapy, 97760- Orthotic Initial, [phone]- Orthotic/Prosthetic subsequent, Patient/Family education, Therapeutic exercises, Therapeutic activity, Neuromuscular re-education, and Self Care  PLAN FOR NEXT SESSION:  Cont complete CDT of Rt LE. Cont to monitor urinary output d/t kidney disease - once maximal reduction gained on Rt and garments are obtained then begin on Lt leg   Latisha Poland, PT 07/07/2023, 1:03 PM

## 2023-07-09 ENCOUNTER — Ambulatory Visit: Admitting: Rehabilitation

## 2023-07-09 ENCOUNTER — Encounter: Payer: Self-pay | Admitting: Rehabilitation

## 2023-07-09 DIAGNOSIS — C9110 Chronic lymphocytic leukemia of B-cell type not having achieved remission: Secondary | ICD-10-CM

## 2023-07-09 DIAGNOSIS — I890 Lymphedema, not elsewhere classified: Secondary | ICD-10-CM | POA: Diagnosis not present

## 2023-07-09 DIAGNOSIS — R262 Difficulty in walking, not elsewhere classified: Secondary | ICD-10-CM

## 2023-07-09 NOTE — Therapy (Signed)
 OUTPATIENT PHYSICAL THERAPY  LOWER EXTREMITY ONCOLOGY TREATMENT  Patient Name: Michael [name] MRN: [id number] DOB:March 09, 1930, 89 y.o., male Today's Date: 07/09/2023  END OF SESSION:  PT End of Session - 07/09/23 1052     Visit Number 12    Number of Visits 25    Date for PT Re-Evaluation 08/03/23    PT Start Time 1100    PT Stop Time 1200    PT Time Calculation (min) 60 min    Activity Tolerance Patient tolerated treatment well    Behavior During Therapy [facility] for tasks assessed/performed           Past Medical History:  Diagnosis Date   Anemia    Anginal pain (HCC)    upon exertion   Arthritis    left shoulder, lower back (10/20/2016)   CLL (chronic lymphoblastic leukemia) dx'd 2000   Edema leg    Family history of adverse reaction to anesthesia    daughter's BP drops    GERD (gastroesophageal reflux disease)    History of hiatal hernia    Leukemia, chronic lymphoid (HCC) 12/08/2010   Lymphocytosis    Type II diabetes mellitus (HCC)    Past Surgical History:  Procedure Laterality Date   BOWEL RESECTION N/A 11/15/2017   Procedure: SMALL BOWEL RESECTION;  Surgeon: Oza Blumenthal, MD;  Location: [facility] OR;  Service: General;  Laterality: N/A;   CATARACT EXTRACTION W/ INTRAOCULAR LENS  IMPLANT, BILATERAL Bilateral    EYE SURGERY     Cataracts (bilateral)   GROIN DISSECTION Left 11/15/2017   Procedure: GROIN EXPLORATION WITH REMOVAL OF INGUINAL HERNIA MESH;  Surgeon: Oza Blumenthal, MD;  Location: [facility] OR;  Service: General;  Laterality: Left;   INGUINAL HERNIA REPAIR Left 11/10/2017   Procedure: LEFT INGUINAL HERNIA REPAIR ERAS PATHWAY;  Surgeon: Oza Blumenthal, MD;  Location: [facility] OR;  Service: General;  Laterality: Left;   INGUINAL HERNIA REPAIR Left 11/15/2017   Procedure: LEFT INGUINAL HERNIA REPAIR WITH MESH;  Surgeon: Oza Blumenthal, MD;  Location: [facility] OR;  Service: General;  Laterality: Left;   INSERTION OF MESH Left 11/10/2017   Procedure: INSERTION OF  MESH;  Surgeon: Oza Blumenthal, MD;  Location: [facility] OR;  Service: General;  Laterality: Left;   Patient Active Problem List   Diagnosis Date Noted   Iron  deficiency anemia 02/20/2022   Acute on chronic diastolic CHF (congestive heart failure) (HCC) 02/19/2022   Maxillary fracture (HCC) 02/19/2022   Syncope and collapse 02/19/2022   Stage 3a chronic kidney disease (CKD) (HCC) 02/19/2022   Obesity (BMI 30-39.9) 02/19/2022   Superficial retinal hemorrhage of right eye 07/16/2021   Stable treated proliferative diabetic retinopathy of right eye determined by examination associated with type 2 diabetes mellitus (HCC) 10/23/2019   Proliferative diabetic retinopathy of left eye (HCC) 10/23/2019   Chronic kidney disease, stage III (moderate) (HCC) 09/12/2019   Type II diabetes mellitus (HCC)    Acute cystitis without hematuria    Malnutrition of moderate degree 12/21/2017   Hypotension 12/14/2017   Hyponatremia 12/14/2017   ARF (acute renal failure) (HCC) 12/14/2017   Pressure ulcer 11/22/2017   Ileus (HCC) 11/14/2017   S/P left inguinal hernia repair 11/10/2017   Weight loss, non-intentional 04/12/2017   Essential hypertension 10/20/2016   Paresthesias 10/19/2016   Left inguinal hernia 10/12/2016   Protein-calorie malnutrition, mild (HCC) 01/10/2016   Diarrhea due to drug 06/17/2015   Chronic left shoulder pain 06/17/2015   Bilateral leg edema 06/17/2015   Pancytopenia, acquired (HCC) 04/22/2015  Thrombocytopenia (HCC) 10/11/2014   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   CLL (chronic lymphocytic leukemia) (HCC) 12/08/2010    PCP: [name], DO  REFERRING PROVIDER:  [name], MD  REFERRING DIAG: C91.10 (ICD-10-CM) - CLL (chronic lymphocytic leukemia) (HCC) R60.0 (ICD-10-CM) - Bilateral leg edema  THERAPY DIAG:  Lymphedema, not elsewhere classified  Difficulty in walking, not elsewhere classified  Leukemia, lymphocytic, chronic (HCC)  ONSET DATE:  12/27/22  Rationale for Evaluation and Treatment: Rehabilitation  SUBJECTIVE:                                                                                                                                                                                           SUBJECTIVE STATEMENT:   Nothing new  PERTINENT HISTORY: CLL (chronic lymphocytic leukemia) (HCC) diagnosed on 04/03/2008. 04/29/2015 - 12/01/2021 Chemotherapy  04/29/2015: Ct scan showed mild lymphadenopathy and splenomegaly 01/08/2016: response to therapy.  06/11/23: Findings raise concern for possible recurrence of CLL - enlarged spleen and lymph nodes - starting new chemotherapy pill 06/27/23  PAIN:  Are you having pain? No, legs just feel sore now PRECAUTIONS: None  RED FLAGS: None   WEIGHT BEARING RESTRICTIONS: No  FALLS:  Has patient fallen in last 6 months? 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  COGNITION: Overall cognitive status: Within functional limits for tasks assessed   PALPATION: Fibrosis present in bilateral LEs  OBSERVATIONS / OTHER ASSESSMENTS: papillomas and hyperkeratosis present    Media Information  Document Information EVAL    07/02/23  POSTURE: forward head, rounded shoulders   LYMPHEDEMA ASSESSMENTS:   LOWER EXTREMITY LANDMARK RIGHT eval 06/18/23 06/25/23 07/02/23 07/09/23  At groin       30 cm proximal to suprapatella       20 cm proximal to suprapatella  59.5      10 cm proximal to suprapatella 56.1 56.8 55.4 56.5 55.5  At midpatella / popliteal crease 47 48 42.4 46 46  40 cm proximal to floor at lateral plantar foot 55.8  46.6 45 45.3  30 cm proximal to floor at lateral plantar foot 53.8  49.3 47.3 45.3 43.5  20 cm proximal to floor at lateral plantar foot 46.9 44.9 42.4 40.0 38.4  10 cm proximal to floor at lateral plantar foot 38 34 31.4 31.2 31  Circumference of ankle/heel       5 cm proximal to 1st MTP joint 31.5 28.5 27.7 26.5 26.5  Across MTP joint 29.9 27.9 27.8 26.5 27  Around proximal great toe 12 11.0 11.3 10.2 10.2  (Blank rows = not tested)  LOWER EXTREMITY LANDMARK LEFT eval  At groin   30 cm proximal to suprapatella   20 cm proximal to suprapatella 57.5  10 cm proximal to suprapatella 55.9  At midpatella / popliteal crease 42.5  40 cm proximal to floor at lateral plantar foot 50.5  30 cm proximal to floor at lateral plantar foot 50  20 cm proximal to floor at lateral plantar foot 42  10 cm proximal to floor at lateral plantar foot 34.2  Circumference of ankle/heel   5 cm proximal to 1st MTP joint 30.4  Across MTP joint 30.1  Around proximal great toe 12.5  (Blank rows = not tested)  LLIS:  Flowsheet Row Outpatient Rehab from 06/08/2023 in [facility]  Lymphedema Life Impact Scale Total Score 72.06 %                                              TREATMENT DATE:  07/09/2023 Removed bandages and washed leg with soap and water. Measured leg  MLD to Rt LE  as follows: Short neck, superficial and deep abdominals, Rt axillary and pectoral nodes, Rt inguino-axillary anastomosis, then Rt LE working from proximal to distal and retracing all steps,  then retracing all steps back to anastomosis.  Compression Bandaging: Cocoa butter applied to Rt lower leg and foot/toes, cut new TG soft size large from foot to knee, Molelast to toes 1-4, artiflex to foot/ankle, 1/2 gray foam ant and post lower leg  fixated with artiflex, then short stretch compression bandages as follows: 1-6 cm to foot/ankle with 1/2 gray foam to dorsal foot/lateral malleolous and wagon wheel to medial malleolous, then 1-8 cm ASH pattern, and 2-12 cm from ankle to below knee. Folded TG soft over top of bandage  07/07/2023 Removed bandages and washed leg with soap and water. MLD to Rt LE  as follows: Short neck, superficial and deep abdominals, Rt axillary and pectoral nodes, Rt inguino-axillary anastomosis, then Rt LE working from proximal to distal and retracing all steps,  then retracing all steps back to anastomosis.  Compression Bandaging: Cocoa butter applied to Rt lower leg and foot/toes, cut new TG soft size large from foot to knee, Molelast to toes 1-4, artiflex to foot/ankle, 1/2 gray foam ant and post lower leg fixated with artiflex, then short stretch compression bandages as follows: 1-6 cm to foot/ankle with 1/2 gray foam to dorsal foot/lateral malleolous and wagon wheel to medial malleolous, then 1-8 cm ASH pattern, and 2-12 cm from ankle to below knee. Folded TG soft over top of bandage.   07/05/23: Manual Therapy Removed bandages and washed leg with soap and water. Remeasured and discussed a velcro - measured for this and will enter into abilico   MLD to Rt LE  as follows: Short neck, superficial and deep abdominals, Rt axillary and pectoral nodes, Rt inguino-axillary anastomosis, then Rt LE working from proximal  to distal and retracing all steps,  then retracing all steps back to anastomosis.  Compression Bandaging: Cocoa butter applied to Rt lower leg and foot/toes, cut new TG soft size large from foot to knee, Molelast to toes 1-4, artiflex to foot/ankle, 1/2 gray foam ant and post lower leg fixated with artiflex, then short stretch compression bandages as follows: 1-6 cm to foot/ankle with 1/2 gray foam to dorsal foot/lateral malleolous and wagon wheel to medial malleolous, then 1-8 cm ASH pattern, and 2-12  cm from ankle to below knee. Folded TG soft over top of bandage.   PATIENT EDUCATION:  Education details: education on lymphedema, anatomy and physiology of the lymphatic system, complete CDT and eventual needs for compression garments, educated about need for post op shoe Person educated: Patient and Child(ren) Education method: Explanation Education comprehension: verbalized understanding  HOME EXERCISE PROGRAM: Obtain post op shoe to begin bandaging  ASSESSMENT:  CLINICAL IMPRESSION: Continued CDT. Velcro is now ordered   OBJECTIVE IMPAIRMENTS: decreased knowledge of condition, decreased knowledge of use of DME, difficulty walking, decreased ROM, increased edema, and pain.   ACTIVITY LIMITATIONS: bending, sitting, standing, squatting, sleeping, stairs, and transfers  PARTICIPATION LIMITATIONS: meal prep, cleaning, laundry, community activity, and yard work  PERSONAL FACTORS: Age, Fitness, Past/current experiences, and 1 comorbidity: kidney disease are also affecting patient's functional outcome.   REHAB POTENTIAL: Good  CLINICAL DECISION MAKING: Evolving/moderate complexity  EVALUATION COMPLEXITY: Moderate   GOALS: Goals reviewed with patient? Yes  SHORT TERM GOALS: Target date: 07/06/23  Pt will demonstrate a 4 cm reduction in circumferences 30 cm superior to floor at lateral foot bilaterally to decrease risk of infection. Baseline: Goal status: INITIAL  2.  Pt will report a 25% improvement in pain in bilateral LEs to allow improved comfort.  Baseline:  Goal status: INITIAL    LONG TERM GOALS: Target date: 08/03/23  Pt will demonstrate maximal circumferential reductions as evidenced by no further significant reductions in circumferences to allow pt to be measured for compression garments.  Baseline:  Goal status: INITIAL  2.  Pt will obtain appropriate compression garments for day and night for long term management of lymphedema. Baseline:  Goal status:  INITIAL  3.  Pt will benefit from a trial of a compression pump for long term management of lymphedema. Baseline:  Goal status: INITIAL  4.  Pt and/or daughter will be independent in self MLD for long term management of lymphedema. Baseline:  Goal status: INITIAL   PLAN:  PT FREQUENCY: 3x/week  PT DURATION: 8 weeks  PLANNED INTERVENTIONS: [id number]- PT Re-evaluation, 97110-Therapeutic exercises, 97530- Therapeutic activity, 97535- Self Care, [id number]- Manual therapy, 97760- Orthotic Initial, [phone]- Orthotic/Prosthetic subsequent, Patient/Family education, Therapeutic exercises, Therapeutic activity, Neuromuscular re-education, and Self Care  PLAN FOR NEXT SESSION:  Cont complete CDT of Rt LE. Cont to monitor urinary output d/t kidney disease - once maximal reduction gained on Rt and garments are obtained then begin on Lt leg   Encarnacion Harris, PT 07/09/2023, 12:03 PM

## 2023-07-12 ENCOUNTER — Other Ambulatory Visit: Payer: Self-pay | Admitting: Hematology and Oncology

## 2023-07-12 ENCOUNTER — Ambulatory Visit: Admitting: Rehabilitation

## 2023-07-12 DIAGNOSIS — I890 Lymphedema, not elsewhere classified: Secondary | ICD-10-CM | POA: Diagnosis not present

## 2023-07-12 DIAGNOSIS — C9110 Chronic lymphocytic leukemia of B-cell type not having achieved remission: Secondary | ICD-10-CM

## 2023-07-12 DIAGNOSIS — R262 Difficulty in walking, not elsewhere classified: Secondary | ICD-10-CM

## 2023-07-13 ENCOUNTER — Inpatient Hospital Stay (HOSPITAL_BASED_OUTPATIENT_CLINIC_OR_DEPARTMENT_OTHER): Admitting: Hematology and Oncology

## 2023-07-13 ENCOUNTER — Encounter: Payer: Self-pay | Admitting: Hematology and Oncology

## 2023-07-13 ENCOUNTER — Ambulatory Visit (HOSPITAL_COMMUNITY)
Admission: RE | Admit: 2023-07-13 | Discharge: 2023-07-13 | Disposition: A | Discharge status: Home / Self Care | Source: Ambulatory Visit | Attending: Hematology and Oncology | Admitting: Hematology and Oncology

## 2023-07-13 ENCOUNTER — Encounter: Payer: Self-pay | Admitting: Rehabilitation

## 2023-07-13 ENCOUNTER — Inpatient Hospital Stay

## 2023-07-13 ENCOUNTER — Other Ambulatory Visit: Payer: Self-pay

## 2023-07-13 VITALS — BP 119/45 | HR 71 | Temp 97.7°F | Resp 18 | Ht 67.0 in | Wt 203.8 lb

## 2023-07-13 DIAGNOSIS — C9110 Chronic lymphocytic leukemia of B-cell type not having achieved remission: Secondary | ICD-10-CM

## 2023-07-13 DIAGNOSIS — I5030 Unspecified diastolic (congestive) heart failure: Secondary | ICD-10-CM

## 2023-07-13 DIAGNOSIS — I517 Cardiomegaly: Secondary | ICD-10-CM

## 2023-07-13 DIAGNOSIS — E119 Type 2 diabetes mellitus without complications: Secondary | ICD-10-CM | POA: Diagnosis not present

## 2023-07-13 DIAGNOSIS — R600 Localized edema: Secondary | ICD-10-CM | POA: Insufficient documentation

## 2023-07-13 DIAGNOSIS — I5033 Acute on chronic diastolic (congestive) heart failure: Secondary | ICD-10-CM | POA: Insufficient documentation

## 2023-07-13 LAB — CBC WITH DIFFERENTIAL/PLATELET
Abs Immature Granulocytes: 0.04 10*3/uL (ref 0.00–0.07)
Basophils Absolute: 0 10*3/uL (ref 0.0–0.1)
Basophils Relative: 1 %
Eosinophils Absolute: 0 10*3/uL (ref 0.0–0.5)
Eosinophils Relative: 1 %
HCT: 29.3 % — ABNORMAL LOW (ref 39.0–52.0)
Hemoglobin: 9.9 g/dL — ABNORMAL LOW (ref 13.0–17.0)
Immature Granulocytes: 1 %
Lymphocytes Relative: 58 %
Lymphs Abs: 3.8 10*3/uL (ref 0.7–4.0)
MCH: 31.5 pg (ref 26.0–34.0)
MCHC: 33.8 g/dL (ref 30.0–36.0)
MCV: 93.3 fL (ref 80.0–100.0)
Monocytes Absolute: 0.2 10*3/uL (ref 0.1–1.0)
Monocytes Relative: 3 %
Neutro Abs: 2.2 10*3/uL (ref 1.7–7.7)
Neutrophils Relative %: 36 %
Platelets: 59 10*3/uL — ABNORMAL LOW (ref 150–400)
RBC: 3.14 MIL/uL — ABNORMAL LOW (ref 4.22–5.81)
RDW: 15.3 % (ref 11.5–15.5)
WBC: 6.3 10*3/uL (ref 4.0–10.5)
nRBC: 0 % (ref 0.0–0.2)

## 2023-07-13 LAB — ECHOCARDIOGRAM COMPLETE
AR max vel: 2.68 cm2
AV Area VTI: 3.04 cm2
AV Area mean vel: 2.78 cm2
AV Mean grad: 4 mmHg
AV Peak grad: 7.4 mmHg
Ao pk vel: 1.36 m/s
Area-P 1/2: 2.93 cm2
Height: 67 in
S' Lateral: 2.9 cm
Weight: 3260.8 [oz_av]

## 2023-07-13 LAB — COMPREHENSIVE METABOLIC PANEL WITH GFR
ALT: 5 U/L (ref 0–44)
AST: 13 U/L — ABNORMAL LOW (ref 15–41)
Albumin: 3.6 g/dL (ref 3.5–5.0)
Alkaline Phosphatase: 43 U/L (ref 38–126)
Anion gap: 7 (ref 5–15)
BUN: 18 mg/dL (ref 8–23)
CO2: 28 mmol/L (ref 22–32)
Calcium: 8.2 mg/dL — ABNORMAL LOW (ref 8.9–10.3)
Chloride: 104 mmol/L (ref 98–111)
Creatinine, Ser: 1.28 mg/dL — ABNORMAL HIGH (ref 0.61–1.24)
GFR, Estimated: 53 mL/min — ABNORMAL LOW (ref >60)
Glucose, Bld: 258 mg/dL — ABNORMAL HIGH (ref 70–99)
Potassium: 3.8 mmol/L (ref 3.5–5.1)
Sodium: 139 mmol/L (ref 135–145)
Total Bilirubin: 0.7 mg/dL (ref 0.0–1.2)
Total Protein: 6.3 g/dL — ABNORMAL LOW (ref 6.5–8.1)

## 2023-07-13 NOTE — Therapy (Signed)
 OUTPATIENT PHYSICAL THERAPY  LOWER EXTREMITY ONCOLOGY TREATMENT  Patient Name: Michael [name] MRN: [id number] DOB:[birth year], 89 y.o., male Today's Date: 07/13/2023  END OF SESSION:  PT End of Session - 07/13/23 0946     Visit Number 13    Number of Visits 25    Date for PT Re-Evaluation 08/03/23    PT Start Time 1107    PT Stop Time 1200    PT Time Calculation (min) 53 min    Activity Tolerance Patient tolerated treatment well    Behavior During Therapy [facility] for tasks assessed/performed           Past Medical History:  Diagnosis Date   Anemia    Anginal pain (HCC)    upon exertion   Arthritis    left shoulder, lower back (10/20/2016)   CLL (chronic lymphoblastic leukemia) dx'd 2000   Edema leg    Family history of adverse reaction to anesthesia    daughter's BP drops    GERD (gastroesophageal reflux disease)    History of hiatal hernia    Leukemia, chronic lymphoid (HCC) 12/08/2010   Lymphocytosis    Type II diabetes mellitus (HCC)    Past Surgical History:  Procedure Laterality Date   BOWEL RESECTION N/A 11/15/2017   Procedure: SMALL BOWEL RESECTION;  Surgeon: Oza Blumenthal, MD;  Location: [facility] OR;  Service: General;  Laterality: N/A;   CATARACT EXTRACTION W/ INTRAOCULAR LENS  IMPLANT, BILATERAL Bilateral    EYE SURGERY     Cataracts (bilateral)   GROIN DISSECTION Left 11/15/2017   Procedure: GROIN EXPLORATION WITH REMOVAL OF INGUINAL HERNIA MESH;  Surgeon: Oza Blumenthal, MD;  Location: [facility] OR;  Service: General;  Laterality: Left;   INGUINAL HERNIA REPAIR Left 11/10/2017   Procedure: LEFT INGUINAL HERNIA REPAIR ERAS PATHWAY;  Surgeon: Oza Blumenthal, MD;  Location: [facility] OR;  Service: General;  Laterality: Left;   INGUINAL HERNIA REPAIR Left 11/15/2017   Procedure: LEFT INGUINAL HERNIA REPAIR WITH MESH;  Surgeon: Oza Blumenthal, MD;  Location: [facility] OR;  Service: General;  Laterality: Left;   INSERTION OF MESH Left 11/10/2017   Procedure: INSERTION OF  MESH;  Surgeon: Oza Blumenthal, MD;  Location: [facility] OR;  Service: General;  Laterality: Left;   Patient Active Problem List   Diagnosis Date Noted   Iron  deficiency anemia 02/20/2022   Acute on chronic diastolic CHF (congestive heart failure) (HCC) 02/19/2022   Maxillary fracture (HCC) 02/19/2022   Syncope and collapse 02/19/2022   Stage 3a chronic kidney disease (CKD) (HCC) 02/19/2022   Obesity (BMI 30-39.9) 02/19/2022   Superficial retinal hemorrhage of right eye 07/16/2021   Stable treated proliferative diabetic retinopathy of right eye determined by examination associated with type 2 diabetes mellitus (HCC) 10/23/2019   Proliferative diabetic retinopathy of left eye (HCC) 10/23/2019   Chronic kidney disease, stage III (moderate) (HCC) 09/12/2019   Type II diabetes mellitus (HCC)    Acute cystitis without hematuria    Malnutrition of moderate degree 12/21/2017   Hypotension 12/14/2017   Hyponatremia 12/14/2017   ARF (acute renal failure) (HCC) 12/14/2017   Pressure ulcer 11/22/2017   Ileus (HCC) 11/14/2017   S/P left inguinal hernia repair 11/10/2017   Weight loss, non-intentional 04/12/2017   Essential hypertension 10/20/2016   Paresthesias 10/19/2016   Left inguinal hernia 10/12/2016   Protein-calorie malnutrition, mild (HCC) 01/10/2016   Diarrhea due to drug 06/17/2015   Chronic left shoulder pain 06/17/2015   Bilateral leg edema 06/17/2015   Pancytopenia, acquired (HCC) 04/22/2015  Thrombocytopenia (HCC) 10/11/2014   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   CLL (chronic lymphocytic leukemia) (HCC) 12/08/2010    PCP: [name], DO  REFERRING PROVIDER:  [name], MD  REFERRING DIAG: C91.10 (ICD-10-CM) - CLL (chronic lymphocytic leukemia) (HCC) R60.0 (ICD-10-CM) - Bilateral leg edema  THERAPY DIAG:  Lymphedema, not elsewhere classified  Difficulty in walking, not elsewhere classified  Leukemia, lymphocytic, chronic (HCC)  ONSET DATE:  12/27/22  Rationale for Evaluation and Treatment: Rehabilitation  SUBJECTIVE:                                                                                                                                                                                           SUBJECTIVE STATEMENT:   Nothing new  PERTINENT HISTORY: CLL (chronic lymphocytic leukemia) (HCC) diagnosed on 04/03/2008. 04/29/2015 - 12/01/2021 Chemotherapy  04/29/2015: Ct scan showed mild lymphadenopathy and splenomegaly 01/08/2016: response to therapy.  06/11/23: Findings raise concern for possible recurrence of CLL - enlarged spleen and lymph nodes - starting new chemotherapy pill 06/27/23  PAIN:  Are you having pain? No, legs just feel sore now PRECAUTIONS: None  RED FLAGS: None   WEIGHT BEARING RESTRICTIONS: No  FALLS:  Has patient fallen in last 6 months? 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  COGNITION: Overall cognitive status: Within functional limits for tasks assessed   PALPATION: Fibrosis present in bilateral LEs  OBSERVATIONS / OTHER ASSESSMENTS: papillomas and hyperkeratosis present    Media Information  Document Information EVAL    07/02/23  POSTURE: forward head, rounded shoulders   LYMPHEDEMA ASSESSMENTS:   LOWER EXTREMITY LANDMARK RIGHT eval 06/18/23 06/25/23 07/02/23 07/09/23  At groin       30 cm proximal to suprapatella       20 cm proximal to suprapatella  59.5      10 cm proximal to suprapatella 56.1 56.8 55.4 56.5 55.5  At midpatella / popliteal crease 47 48 42.4 46 46  40 cm proximal to floor at lateral plantar foot 55.8  46.6 45 45.3  30 cm proximal to floor at lateral plantar foot 53.8  49.3 47.3 45.3 43.5  20 cm proximal to floor at lateral plantar foot 46.9 44.9 42.4 40.0 38.4  10 cm proximal to floor at lateral plantar foot 38 34 31.4 31.2 31  Circumference of ankle/heel       5 cm proximal to 1st MTP joint 31.5 28.5 27.7 26.5 26.5  Across MTP joint 29.9 27.9 27.8 26.5 27  Around proximal great toe 12 11.0 11.3 10.2 10.2  (Blank rows = not tested)  LOWER EXTREMITY LANDMARK LEFT eval  At groin   30 cm proximal to suprapatella   20 cm proximal to suprapatella 57.5  10 cm proximal to suprapatella 55.9  At midpatella / popliteal crease 42.5  40 cm proximal to floor at lateral plantar foot 50.5  30 cm proximal to floor at lateral plantar foot 50  20 cm proximal to floor at lateral plantar foot 42  10 cm proximal to floor at lateral plantar foot 34.2  Circumference of ankle/heel   5 cm proximal to 1st MTP joint 30.4  Across MTP joint 30.1  Around proximal great toe 12.5  (Blank rows = not tested)  LLIS:  Flowsheet Row Outpatient Rehab from 06/08/2023 in [facility]  Lymphedema Life Impact Scale Total Score 72.06 %                                              TREATMENT DATE:  07/13/2023 MLD to Rt LE  as follows: Short neck, superficial and deep abdominals, Rt axillary and pectoral nodes, Rt inguino-axillary anastomosis, then Rt LE working from proximal to distal and retracing all steps,  then retracing all steps back to anastomosis.  Compression Bandaging: Cocoa butter applied to Rt lower leg and foot/toes, cut new TG soft size large from foot to knee, Molelast to toes 1-4, artiflex to foot/ankle, 1/2 gray foam ant and post lower leg fixated with artiflex, then short stretch compression bandages as  follows: 1-6 cm to foot/ankle with 1/2 gray foam to dorsal foot/lateral malleolous and wagon wheel to medial malleolous, then 1-8 cm ASH pattern, and 2-12 cm from ankle to below knee. Folded TG soft over top of bandage  07/09/2023 Removed bandages and washed leg with soap and water. Measured leg  MLD to Rt LE  as follows: Short neck, superficial and deep abdominals, Rt axillary and pectoral nodes, Rt inguino-axillary anastomosis, then Rt LE working from proximal to distal and retracing all steps,  then retracing all steps back to anastomosis.  Compression Bandaging: Cocoa butter applied to Rt lower leg and foot/toes, cut new TG soft size large from foot to knee, Molelast to toes 1-4, artiflex to foot/ankle, 1/2 gray foam ant and post lower leg fixated with artiflex, then short stretch compression bandages as follows: 1-6 cm to foot/ankle with 1/2 gray foam to dorsal foot/lateral malleolous and wagon wheel to medial malleolous, then 1-8 cm ASH pattern, and 2-12 cm from ankle to below knee. Folded TG soft over top of bandage  07/07/2023 Removed bandages and washed leg with soap and water. MLD to Rt LE  as follows: Short neck, superficial and deep abdominals, Rt axillary and pectoral nodes, Rt inguino-axillary anastomosis, then Rt LE working from proximal to distal and retracing all steps,  then retracing all steps back to anastomosis.  Compression Bandaging: Cocoa butter applied to Rt lower leg and foot/toes, cut new  TG soft size large from foot to knee, Molelast to toes 1-4, artiflex to foot/ankle, 1/2 gray foam ant and post lower leg fixated with artiflex, then short stretch compression bandages as follows: 1-6 cm to foot/ankle with 1/2 gray foam to dorsal foot/lateral malleolous and wagon wheel to medial malleolous, then 1-8 cm ASH pattern, and 2-12 cm from ankle to below knee. Folded TG soft over top of bandage.   07/05/23: Manual Therapy Removed bandages and washed leg with soap and  water. Remeasured and discussed a velcro - measured for this and will enter into abilico   MLD to Rt LE  as follows: Short neck, superficial and deep abdominals, Rt axillary and pectoral nodes, Rt inguino-axillary anastomosis, then Rt LE working from proximal to distal and retracing all steps,  then retracing all steps back to anastomosis.  Compression Bandaging: Cocoa butter applied to Rt lower leg and foot/toes, cut new TG soft size large from foot to knee, Molelast to toes 1-4, artiflex to foot/ankle, 1/2 gray foam ant and post lower leg fixated with artiflex, then short stretch compression bandages as follows: 1-6 cm to foot/ankle with 1/2 gray foam to dorsal foot/lateral malleolous and wagon wheel to medial malleolous, then 1-8 cm ASH pattern, and 2-12 cm from ankle to below knee. Folded TG soft over top of bandage.   PATIENT EDUCATION:  Education details: education on lymphedema, anatomy and physiology of the lymphatic system, complete CDT and eventual needs for compression garments, educated about need for post op shoe Person educated: Patient and Child(ren) Education method: Explanation Education comprehension: verbalized understanding  HOME EXERCISE PROGRAM: Obtain post op shoe to begin bandaging  ASSESSMENT:  CLINICAL IMPRESSION: Continued CDT. Velcro is now ordered. Discussed weaning from foam with patient per his request and told him I would discuss with valerie but we would most likely stick with the foam due to his skin status.   OBJECTIVE IMPAIRMENTS: decreased knowledge of condition, decreased knowledge of use of DME, difficulty walking, decreased ROM, increased edema, and pain.   ACTIVITY LIMITATIONS: bending, sitting, standing, squatting, sleeping, stairs, and transfers  PARTICIPATION LIMITATIONS: meal prep, cleaning, laundry, community activity, and yard work  PERSONAL FACTORS: Age, Fitness, Past/current experiences, and 1 comorbidity: kidney disease are also affecting  patient's functional outcome.   REHAB POTENTIAL: Good  CLINICAL DECISION MAKING: Evolving/moderate complexity  EVALUATION COMPLEXITY: Moderate   GOALS: Goals reviewed with patient? Yes  SHORT TERM GOALS: Target date: 07/06/23  Pt will demonstrate a 4 cm reduction in circumferences 30 cm superior to floor at lateral foot bilaterally to decrease risk of infection. Baseline: Goal status: INITIAL  2.  Pt will report a 25% improvement in pain in bilateral LEs to allow improved comfort.  Baseline:  Goal status: INITIAL    LONG TERM GOALS: Target date: 08/03/23  Pt will demonstrate maximal circumferential reductions as evidenced by no further significant reductions in circumferences to allow pt to be measured for compression garments.  Baseline:  Goal status: INITIAL  2.  Pt will obtain appropriate compression garments for day and night for long term management of lymphedema. Baseline:  Goal status: INITIAL  3.  Pt will benefit from a trial of a compression pump for long term management of lymphedema. Baseline:  Goal status: INITIAL  4.  Pt and/or daughter will be independent in self MLD for long term management of lymphedema. Baseline:  Goal status: INITIAL   PLAN:  PT FREQUENCY: 3x/week  PT DURATION: 8 weeks  PLANNED INTERVENTIONS: [id number]- PT  Re-evaluation, 97110-Therapeutic exercises, 97530- Therapeutic activity, 97535- Self Care, [id number]- Manual therapy, [phone]- Orthotic Initial, [id number]- Orthotic/Prosthetic subsequent, Patient/Family education, Therapeutic exercises, Therapeutic activity, Neuromuscular re-education, and Self Care  PLAN FOR NEXT SESSION:  Cont complete CDT of Rt LE. Cont to monitor urinary output d/t kidney disease - once maximal reduction gained on Rt and garments are obtained then begin on Lt leg   Encarnacion Harris, PT 07/13/2023, 9:47 AM

## 2023-07-13 NOTE — Assessment & Plan Note (Addendum)
 The patient was diagnosed with CLL in 2010, Rai stage II with lymphadenopathy and splenomegaly, FISH analysis showed trisomy 11 He received ibrutinib  between April 2017 to November 2023  CT imaging from May showed progressive splenomegaly and lymphadenopathy Due to symptomatic pancytopenia, we made decision to resume treatment with Calquence  on June 1 Overall, he tolerated treatment well He has lost weight but protein level is stable Unfortunately, he is developing slight worsening thrombocytopenia Lymphocyte count has improved I recommend the patient to hold Calquence  for a week I will see him next week for further follow-up

## 2023-07-13 NOTE — Progress Notes (Signed)
 [facility] OFFICE PROGRESS NOTE  Patient Care Team: [name], DO as PCP - General (Family Medicine) [name], MD as Consulting Physician (Hematology and Oncology)  Assessment & Plan CLL (chronic lymphocytic leukemia) [facility]) The patient was diagnosed with CLL in 2010, Rai stage II with lymphadenopathy and splenomegaly, FISH analysis showed trisomy 12 He received ibrutinib  between April 2017 to November 2023  CT imaging from May showed progressive splenomegaly and lymphadenopathy Due to symptomatic pancytopenia, we made decision to resume treatment with Calquence  on June 1 Overall, he tolerated treatment well He has lost weight but protein level is stable Unfortunately, he is developing slight worsening thrombocytopenia Lymphocyte count has improved I recommend the patient to hold Calquence  for a week I will see him next week for further follow-up  No orders of the defined types were placed in this encounter.    [name], MD  INTERVAL HISTORY: he returns for treatment follow-up Complications related to previous cycle of chemotherapy included pancytopenia, The patient denies any recent signs or symptoms of bleeding such as spontaneous epistaxis, hematuria or hematochezia.  PHYSICAL EXAMINATION: ECOG PERFORMANCE STATUS: 1 - Symptomatic but completely ambulatory  No results found for: CAN125    Latest Ref Rng & Units 07/13/2023   11:29 AM 07/06/2023    1:10 PM 06/11/2023   12:07 AM  CBC  WBC 4.0 - 10.5 K/uL 6.3  10.0  12.5   Hemoglobin 13.0 - 17.0 g/dL 9.9  16.1  9.6   Hematocrit 39.0 - 52.0 % 29.3  30.2  29.9   Platelets 150 - 400 K/uL 59  69  74       Chemistry      Component Value Date/Time   NA 139 07/13/2023 1129   NA 140 10/09/2016 1106   K 3.8 07/13/2023 1129   K 4.7 10/09/2016 1106   CL 104 07/13/2023 1129   CL 104 03/28/2012 1426   CO2 28 07/13/2023 1129   CO2 28 10/09/2016 1106   BUN 18 07/13/2023 1129   BUN 20.8 10/09/2016 1106    CREATININE 1.28 (H) 07/13/2023 1129   CREATININE 1.30 (H) 06/02/2022 1123   CREATININE 1.1 10/09/2016 1106      Component Value Date/Time   CALCIUM  8.2 (L) 07/13/2023 1129   CALCIUM  9.1 10/09/2016 1106   ALKPHOS 43 07/13/2023 1129   ALKPHOS 46 10/09/2016 1106   AST 13 (L) 07/13/2023 1129   AST 16 06/02/2022 1123   AST 17 10/09/2016 1106   ALT 5 07/13/2023 1129   ALT 8 06/02/2022 1123   ALT 8 10/09/2016 1106   BILITOT 0.7 07/13/2023 1129   BILITOT 0.5 06/02/2022 1123   BILITOT 0.23 10/09/2016 1106       Vitals:   07/13/23 1155  BP: (!) 119/45  Pulse: 71  Resp: 18  Temp: 97.7 F (36.5 C)  SpO2: 100%   Filed Weights   07/13/23 1155  Weight: 203 lb 12.8 oz (92.4 kg)   Other relevant data reviewed during this visit included CBC and CMP

## 2023-07-14 ENCOUNTER — Encounter: Payer: Self-pay | Admitting: Rehabilitation

## 2023-07-14 ENCOUNTER — Other Ambulatory Visit: Payer: Self-pay

## 2023-07-14 ENCOUNTER — Ambulatory Visit: Admitting: Rehabilitation

## 2023-07-14 DIAGNOSIS — I890 Lymphedema, not elsewhere classified: Secondary | ICD-10-CM | POA: Diagnosis not present

## 2023-07-14 DIAGNOSIS — C9110 Chronic lymphocytic leukemia of B-cell type not having achieved remission: Secondary | ICD-10-CM

## 2023-07-14 DIAGNOSIS — R262 Difficulty in walking, not elsewhere classified: Secondary | ICD-10-CM

## 2023-07-14 NOTE — Therapy (Signed)
 OUTPATIENT PHYSICAL THERAPY  LOWER EXTREMITY ONCOLOGY TREATMENT  Patient Name: Michael [name] MRN: [id number] DOB:January 24, 1931, 89 y.o., male Today's Date: 07/14/2023  END OF SESSION:  PT End of Session - 07/14/23 1316     Visit Number 14    Number of Visits 25    Date for PT Re-Evaluation 08/03/23    PT Start Time 1200    PT Stop Time 1300    PT Time Calculation (min) 60 min    Activity Tolerance Patient tolerated treatment well    Behavior During Therapy [facility] for tasks assessed/performed           Past Medical History:  Diagnosis Date   Anemia    Anginal pain (HCC)    upon exertion   Arthritis    left shoulder, lower back (10/20/2016)   CLL (chronic lymphoblastic leukemia) dx'd 2000   Edema leg    Family history of adverse reaction to anesthesia    daughter's BP drops    GERD (gastroesophageal reflux disease)    History of hiatal hernia    Leukemia, chronic lymphoid (HCC) 12/08/2010   Lymphocytosis    Type II diabetes mellitus (HCC)    Past Surgical History:  Procedure Laterality Date   BOWEL RESECTION N/A 11/15/2017   Procedure: SMALL BOWEL RESECTION;  Surgeon: Oza Blumenthal, MD;  Location: [facility] OR;  Service: General;  Laterality: N/A;   CATARACT EXTRACTION W/ INTRAOCULAR LENS  IMPLANT, BILATERAL Bilateral    EYE SURGERY     Cataracts (bilateral)   GROIN DISSECTION Left 11/15/2017   Procedure: GROIN EXPLORATION WITH REMOVAL OF INGUINAL HERNIA MESH;  Surgeon: Oza Blumenthal, MD;  Location: [facility] OR;  Service: General;  Laterality: Left;   INGUINAL HERNIA REPAIR Left 11/10/2017   Procedure: LEFT INGUINAL HERNIA REPAIR ERAS PATHWAY;  Surgeon: Oza Blumenthal, MD;  Location: [facility] OR;  Service: General;  Laterality: Left;   INGUINAL HERNIA REPAIR Left 11/15/2017   Procedure: LEFT INGUINAL HERNIA REPAIR WITH MESH;  Surgeon: Oza Blumenthal, MD;  Location: [facility] OR;  Service: General;  Laterality: Left;   INSERTION OF MESH Left 11/10/2017   Procedure: INSERTION OF  MESH;  Surgeon: Oza Blumenthal, MD;  Location: [facility] OR;  Service: General;  Laterality: Left;   Patient Active Problem List   Diagnosis Date Noted   Iron  deficiency anemia 02/20/2022   Acute on chronic diastolic CHF (congestive heart failure) (HCC) 02/19/2022   Maxillary fracture (HCC) 02/19/2022   Syncope and collapse 02/19/2022   Stage 3a chronic kidney disease (CKD) (HCC) 02/19/2022   Obesity (BMI 30-39.9) 02/19/2022   Superficial retinal hemorrhage of right eye 07/16/2021   Stable treated proliferative diabetic retinopathy of right eye determined by examination associated with type 2 diabetes mellitus (HCC) 10/23/2019   Proliferative diabetic retinopathy of left eye (HCC) 10/23/2019   Chronic kidney disease, stage III (moderate) (HCC) 09/12/2019   Type II diabetes mellitus (HCC)    Acute cystitis without hematuria    Malnutrition of moderate degree 12/21/2017   Hypotension 12/14/2017   Hyponatremia 12/14/2017   ARF (acute renal failure) (HCC) 12/14/2017   Pressure ulcer 11/22/2017   Ileus (HCC) 11/14/2017   S/P left inguinal hernia repair 11/10/2017   Weight loss, non-intentional 04/12/2017   Essential hypertension 10/20/2016   Paresthesias 10/19/2016   Left inguinal hernia 10/12/2016   Protein-calorie malnutrition, mild (HCC) 01/10/2016   Diarrhea due to drug 06/17/2015   Chronic left shoulder pain 06/17/2015   Bilateral leg edema 06/17/2015   Pancytopenia, acquired (HCC) 04/22/2015  Thrombocytopenia (HCC) 10/11/2014   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   CLL (chronic lymphocytic leukemia) (HCC) 12/08/2010    PCP: [name], DO  REFERRING PROVIDER:  [name], MD  REFERRING DIAG: C91.10 (ICD-10-CM) - CLL (chronic lymphocytic leukemia) (HCC) R60.0 (ICD-10-CM) - Bilateral leg edema  THERAPY DIAG:  Lymphedema, not elsewhere classified  Difficulty in walking, not elsewhere classified  Leukemia, lymphocytic, chronic (HCC)  ONSET DATE:  12/27/22  Rationale for Evaluation and Treatment: Rehabilitation  SUBJECTIVE:                                                                                                                                                                                           SUBJECTIVE STATEMENT:   Nothing new  PERTINENT HISTORY: CLL (chronic lymphocytic leukemia) (HCC) diagnosed on 04/03/2008. 04/29/2015 - 12/01/2021 Chemotherapy  04/29/2015: Ct scan showed mild lymphadenopathy and splenomegaly 01/08/2016: response to therapy.  06/11/23: Findings raise concern for possible recurrence of CLL - enlarged spleen and lymph nodes - starting new chemotherapy pill 06/27/23  PAIN:  Are you having pain? No, legs just feel sore now PRECAUTIONS: None  RED FLAGS: None   WEIGHT BEARING RESTRICTIONS: No  FALLS:  Has patient fallen in last 6 months? 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  COGNITION: Overall cognitive status: Within functional limits for tasks assessed   PALPATION: Fibrosis present in bilateral LEs  OBSERVATIONS / OTHER ASSESSMENTS: papillomas and hyperkeratosis present    Media Information  Document Information EVAL    07/02/23  POSTURE: forward head, rounded shoulders   LYMPHEDEMA ASSESSMENTS:   LOWER EXTREMITY LANDMARK RIGHT eval 06/18/23 06/25/23 07/02/23 07/09/23  At groin       30 cm proximal to suprapatella       20 cm proximal to suprapatella  59.5      10 cm proximal to suprapatella 56.1 56.8 55.4 56.5 55.5  At midpatella / popliteal crease 47 48 42.4 46 46  40 cm proximal to floor at lateral plantar foot 55.8  46.6 45 45.3  30 cm proximal to floor at lateral plantar foot 53.8  49.3 47.3 45.3 43.5  20 cm proximal to floor at lateral plantar foot 46.9 44.9 42.4 40.0 38.4  10 cm proximal to floor at lateral plantar foot 38 34 31.4 31.2 31  Circumference of ankle/heel       5 cm proximal to 1st MTP joint 31.5 28.5 27.7 26.5 26.5  Across MTP joint 29.9 27.9 27.8 26.5 27  Around proximal great toe 12 11.0 11.3 10.2 10.2  (Blank rows = not tested)  LOWER EXTREMITY LANDMARK LEFT eval  At groin   30 cm proximal to suprapatella   20 cm proximal to suprapatella 57.5  10 cm proximal to suprapatella 55.9  At midpatella / popliteal crease 42.5  40 cm proximal to floor at lateral plantar foot 50.5  30 cm proximal to floor at lateral plantar foot 50  20 cm proximal to floor at lateral plantar foot 42  10 cm proximal to floor at lateral plantar foot 34.2  Circumference of ankle/heel   5 cm proximal to 1st MTP joint 30.4  Across MTP joint 30.1  Around proximal great toe 12.5  (Blank rows = not tested)  LLIS:  Flowsheet Row Outpatient Rehab from 06/08/2023 in [facility]  Lymphedema Life Impact Scale Total Score 72.06 %                                              TREATMENT DATE:  07/14/2023 Removed bandages and washed leg with soap and water. MLD to Rt LE  as follows: Short neck, superficial and deep abdominals, Rt axillary and pectoral nodes, Rt inguino-axillary anastomosis, then Rt LE working from proximal to distal and retracing all steps,  then retracing all steps back to anastomosis.  Compression Bandaging: Cocoa butter applied to Rt lower leg and foot/toes, cut new TG soft size large from foot to knee, Molelast to toes 1-4, artiflex to foot/ankle, 1/2 gray foam ant and post lower leg fixated with  artiflex, then short stretch compression bandages as follows: 1-6 cm to foot/ankle with 1/2 gray foam to dorsal foot/lateral malleolous and wagon wheel to medial malleolous, then 1-8 cm ASH pattern, and 2-12 cm from ankle to below knee. Folded TG soft over top of bandage  07/13/2023 MLD to Rt LE  as follows: Short neck, superficial and deep abdominals, Rt axillary and pectoral nodes, Rt inguino-axillary anastomosis, then Rt LE working from proximal to distal and retracing all steps,  then retracing all steps back to anastomosis.  Compression Bandaging: Cocoa butter applied to Rt lower leg and foot/toes, cut new TG soft size large from foot to knee, Molelast to toes 1-4, artiflex to foot/ankle, 1/2 gray foam ant and post lower leg fixated with artiflex, then short stretch compression bandages as follows: 1-6 cm to foot/ankle with 1/2 gray foam to dorsal foot/lateral malleolous and wagon wheel to medial malleolous, then 1-8 cm ASH pattern, and 2-12 cm from ankle to below knee. Folded TG soft over top of bandage  07/09/2023 Removed bandages and washed leg with soap and water. Measured leg  MLD to Rt LE  as follows: Short neck, superficial and deep abdominals, Rt axillary and pectoral nodes, Rt inguino-axillary anastomosis, then Rt LE working from proximal to distal and retracing all steps,  then retracing all steps back to anastomosis.  Compression Bandaging: Cocoa butter applied to Rt lower leg and foot/toes, cut new  TG soft size large from foot to knee, Molelast to toes 1-4, artiflex to foot/ankle, 1/2 gray foam ant and post lower leg fixated with artiflex, then short stretch compression bandages as follows: 1-6 cm to foot/ankle with 1/2 gray foam to dorsal foot/lateral malleolous and wagon wheel to medial malleolous, then 1-8 cm ASH pattern, and 2-12 cm from ankle to below knee. Folded TG soft over top of bandage  07/07/2023 Removed bandages and washed leg with soap and water. MLD to Rt LE  as  follows: Short neck, superficial and deep abdominals, Rt axillary and pectoral nodes, Rt inguino-axillary anastomosis, then Rt LE working from proximal to distal and retracing all steps,  then retracing all steps back to anastomosis.  Compression Bandaging: Cocoa butter applied to Rt lower leg and foot/toes, cut new TG soft size large from foot to knee, Molelast to toes 1-4, artiflex to foot/ankle, 1/2 gray foam ant and post lower leg fixated with artiflex, then short stretch compression bandages as follows: 1-6 cm to foot/ankle with 1/2 gray foam to dorsal foot/lateral malleolous and wagon wheel to medial malleolous, then 1-8 cm ASH pattern, and 2-12 cm from ankle to below knee. Folded TG soft over top of bandage.   07/05/23: Manual Therapy Removed bandages and washed leg with soap and water. Remeasured and discussed a velcro - measured for this and will enter into abilico   MLD to Rt LE  as follows: Short neck, superficial and deep abdominals, Rt axillary and pectoral nodes, Rt inguino-axillary anastomosis, then Rt LE working from proximal to distal and retracing all steps,  then retracing all steps back to anastomosis.  Compression Bandaging: Cocoa butter applied to Rt lower leg and foot/toes, cut new TG soft size large from foot to knee, Molelast to toes 1-4, artiflex to foot/ankle, 1/2 gray foam ant and post lower leg fixated with artiflex, then short stretch compression bandages as follows: 1-6 cm to foot/ankle with 1/2 gray foam to dorsal foot/lateral malleolous and wagon wheel to medial malleolous, then 1-8 cm ASH pattern, and 2-12 cm from ankle to below knee. Folded TG soft over top of bandage.   PATIENT EDUCATION:  Education details: education on lymphedema, anatomy and physiology of the lymphatic system, complete CDT and eventual needs for compression garments, educated about need for post op shoe Person educated: Patient and Child(ren) Education method: Explanation Education  comprehension: verbalized understanding  HOME EXERCISE PROGRAM: Obtain post op shoe to begin bandaging  ASSESSMENT:  CLINICAL IMPRESSION: Continued CDT. Velcro is now ordered. Discussed weaning from foam with patient per his request and told him I would discuss with valerie but we would most likely stick with the foam due to his skin status.   OBJECTIVE IMPAIRMENTS: decreased knowledge of condition, decreased knowledge of use of DME, difficulty walking, decreased ROM, increased edema, and pain.   ACTIVITY LIMITATIONS: bending, sitting, standing, squatting, sleeping, stairs, and transfers  PARTICIPATION LIMITATIONS: meal prep, cleaning, laundry, community activity, and yard work  PERSONAL FACTORS: Age, Fitness, Past/current experiences, and 1 comorbidity: kidney disease are also affecting patient's functional outcome.   REHAB POTENTIAL: Good  CLINICAL DECISION MAKING: Evolving/moderate complexity  EVALUATION COMPLEXITY: Moderate   GOALS: Goals reviewed with patient? Yes  SHORT TERM GOALS: Target date: 07/06/23  Pt will demonstrate a 4 cm reduction in circumferences 30 cm superior to floor at lateral foot bilaterally to decrease risk of infection. Baseline: Goal status: INITIAL  2.  Pt will report a 25% improvement in pain in bilateral LEs to  allow improved comfort.  Baseline:  Goal status: INITIAL    LONG TERM GOALS: Target date: 08/03/23  Pt will demonstrate maximal circumferential reductions as evidenced by no further significant reductions in circumferences to allow pt to be measured for compression garments.  Baseline:  Goal status: INITIAL  2.  Pt will obtain appropriate compression garments for day and night for long term management of lymphedema. Baseline:  Goal status: INITIAL  3.  Pt will benefit from a trial of a compression pump for long term management of lymphedema. Baseline:  Goal status: INITIAL  4.  Pt and/or daughter will be independent in self MLD  for long term management of lymphedema. Baseline:  Goal status: INITIAL   PLAN:  PT FREQUENCY: 3x/week  PT DURATION: 8 weeks  PLANNED INTERVENTIONS: [id number]- PT Re-evaluation, 97110-Therapeutic exercises, 97530- Therapeutic activity, 97535- Self Care, [id number]- Manual therapy, 97760- Orthotic Initial, [id number]- Orthotic/Prosthetic subsequent, Patient/Family education, Therapeutic exercises, Therapeutic activity, Neuromuscular re-education, and Self Care  PLAN FOR NEXT SESSION:  Cont complete CDT of Rt LE. Cont to monitor urinary output d/t kidney disease - once maximal reduction gained on Rt and garments are obtained then begin on Lt leg   Kailin Principato, Durward Gillis, PT 07/14/2023, 1:16 PM

## 2023-07-16 ENCOUNTER — Ambulatory Visit: Admitting: Rehabilitation

## 2023-07-16 ENCOUNTER — Encounter: Payer: Self-pay | Admitting: Rehabilitation

## 2023-07-16 ENCOUNTER — Other Ambulatory Visit: Payer: Self-pay

## 2023-07-16 DIAGNOSIS — C9110 Chronic lymphocytic leukemia of B-cell type not having achieved remission: Secondary | ICD-10-CM

## 2023-07-16 DIAGNOSIS — R262 Difficulty in walking, not elsewhere classified: Secondary | ICD-10-CM

## 2023-07-16 DIAGNOSIS — I890 Lymphedema, not elsewhere classified: Secondary | ICD-10-CM | POA: Diagnosis not present

## 2023-07-16 NOTE — Therapy (Addendum)
 OUTPATIENT PHYSICAL THERAPY  LOWER EXTREMITY ONCOLOGY TREATMENT  Patient Name: Michael [name] MRN: [id number] DOB:[birth year], 89 y.o., male Today's Date: 07/16/2023  END OF SESSION:  PT End of Session - 07/16/23 1156     Visit Number 15    Number of Visits 25    Date for PT Re-Evaluation 08/03/23    PT Start Time 1055    PT Stop Time 1150    PT Time Calculation (min) 55 min    Activity Tolerance Patient tolerated treatment well    Behavior During Therapy [facility] for tasks assessed/performed            Past Medical History:  Diagnosis Date   Anemia    Anginal pain (HCC)    upon exertion   Arthritis    left shoulder, lower back (10/20/2016)   CLL (chronic lymphoblastic leukemia) dx'd 2000   Edema leg    Family history of adverse reaction to anesthesia    daughter's BP drops    GERD (gastroesophageal reflux disease)    History of hiatal hernia    Leukemia, chronic lymphoid (HCC) 12/08/2010   Lymphocytosis    Type II diabetes mellitus (HCC)    Past Surgical History:  Procedure Laterality Date   BOWEL RESECTION N/A 11/15/2017   Procedure: SMALL BOWEL RESECTION;  Surgeon: Oza Blumenthal, MD;  Location: [facility] OR;  Service: General;  Laterality: N/A;   CATARACT EXTRACTION W/ INTRAOCULAR LENS  IMPLANT, BILATERAL Bilateral    EYE SURGERY     Cataracts (bilateral)   GROIN DISSECTION Left 11/15/2017   Procedure: GROIN EXPLORATION WITH REMOVAL OF INGUINAL HERNIA MESH;  Surgeon: Oza Blumenthal, MD;  Location: [facility] OR;  Service: General;  Laterality: Left;   INGUINAL HERNIA REPAIR Left 11/10/2017   Procedure: LEFT INGUINAL HERNIA REPAIR ERAS PATHWAY;  Surgeon: Oza Blumenthal, MD;  Location: [facility] OR;  Service: General;  Laterality: Left;   INGUINAL HERNIA REPAIR Left 11/15/2017   Procedure: LEFT INGUINAL HERNIA REPAIR WITH MESH;  Surgeon: Oza Blumenthal, MD;  Location: [facility] OR;  Service: General;  Laterality: Left;   INSERTION OF MESH Left 11/10/2017   Procedure: INSERTION  OF MESH;  Surgeon: Oza Blumenthal, MD;  Location: [facility] OR;  Service: General;  Laterality: Left;   Patient Active Problem List   Diagnosis Date Noted   Iron  deficiency anemia 02/20/2022   Acute on chronic diastolic CHF (congestive heart failure) (HCC) 02/19/2022   Maxillary fracture (HCC) 02/19/2022   Syncope and collapse 02/19/2022   Stage 3a chronic kidney disease (CKD) (HCC) 02/19/2022   Obesity (BMI 30-39.9) 02/19/2022   Superficial retinal hemorrhage of right eye 07/16/2021   Stable treated proliferative diabetic retinopathy of right eye determined by examination associated with type 2 diabetes mellitus (HCC) 10/23/2019   Proliferative diabetic retinopathy of left eye (HCC) 10/23/2019   Chronic kidney disease, stage III (moderate) (HCC) 09/12/2019   Type II diabetes mellitus (HCC)    Acute cystitis without hematuria    Malnutrition of moderate degree 12/21/2017   Hypotension 12/14/2017   Hyponatremia 12/14/2017   ARF (acute renal failure) (HCC) 12/14/2017   Pressure ulcer 11/22/2017   Ileus (HCC) 11/14/2017   S/P left inguinal hernia repair 11/10/2017   Weight loss, non-intentional 04/12/2017   Essential hypertension 10/20/2016   Paresthesias 10/19/2016   Left inguinal hernia 10/12/2016   Protein-calorie malnutrition, mild (HCC) 01/10/2016   Diarrhea due to drug 06/17/2015   Chronic left shoulder pain 06/17/2015   Bilateral leg edema 06/17/2015   Pancytopenia, acquired (HCC) 04/22/2015  Thrombocytopenia (HCC) 10/11/2014   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   CLL (chronic lymphocytic leukemia) (HCC) 12/08/2010    PCP: [name], DO  REFERRING PROVIDER:  [name], MD  REFERRING DIAG: C91.10 (ICD-10-CM) - CLL (chronic lymphocytic leukemia) (HCC) R60.0 (ICD-10-CM) - Bilateral leg edema  THERAPY DIAG:  Lymphedema, not elsewhere classified  Difficulty in walking, not elsewhere classified  Leukemia, lymphocytic, chronic (HCC)  ONSET DATE:  12/27/22  Rationale for Evaluation and Treatment: Rehabilitation  SUBJECTIVE:                                                                                                                                                                                           SUBJECTIVE STATEMENT:   Nothing new  PERTINENT HISTORY: CLL (chronic lymphocytic leukemia) (HCC) diagnosed on 04/03/2008. 04/29/2015 - 12/01/2021 Chemotherapy  04/29/2015: Ct scan showed mild lymphadenopathy and splenomegaly 01/08/2016: response to therapy.  06/11/23: Findings raise concern for possible recurrence of CLL - enlarged spleen and lymph nodes - starting new chemotherapy pill 06/27/23  PAIN:  Are you having pain? No, legs just feel sore now PRECAUTIONS: None  RED FLAGS: None   WEIGHT BEARING RESTRICTIONS: No  FALLS:  Has patient fallen in last 6 months? 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  COGNITION: Overall cognitive status: Within functional limits for tasks assessed   PALPATION: Fibrosis present in bilateral LEs  OBSERVATIONS / OTHER ASSESSMENTS: papillomas and hyperkeratosis present    Media Information  Document Information EVAL    07/02/23  POSTURE: forward head, rounded shoulders   LYMPHEDEMA ASSESSMENTS:   LOWER EXTREMITY LANDMARK RIGHT eval 06/18/23 06/25/23 07/02/23 07/09/23 07/16/23  At groin        30 cm proximal to suprapatella        20 cm proximal to  suprapatella 59.5       10 cm proximal to suprapatella 56.1 56.8 55.4 56.5 55.5   At midpatella / popliteal crease 47 48 42.4 46 46 46  40 cm proximal to floor at lateral plantar foot 55.8  46.6 45 45.3 45  30 cm proximal  to floor at lateral plantar foot 53.8 49.3 47.3 45.3 43.5 42.7  20 cm proximal to floor at lateral plantar foot 46.9 44.9 42.4 40.0 38.4 39.7  10 cm proximal to floor at lateral plantar foot 38 34 31.4 31.2 31 32  Circumference of ankle/heel        5 cm proximal to 1st MTP joint 31.5 28.5 27.7 26.5 26.5 27.5  Across MTP joint 29.9 27.9 27.8 26.5 27 27   Around proximal great toe 12 11.0 11.3 10.2 10.2 10  (Blank rows = not tested)  LOWER EXTREMITY LANDMARK LEFT eval  At groin   30 cm proximal to suprapatella   20 cm proximal to suprapatella 57.5  10 cm proximal to suprapatella 55.9  At midpatella / popliteal crease 42.5  40 cm proximal to floor at lateral plantar foot 50.5  30 cm proximal to floor at lateral plantar foot 50  20 cm proximal to floor at lateral plantar foot 42  10 cm proximal to floor at lateral plantar foot 34.2  Circumference of ankle/heel   5 cm proximal to 1st MTP joint 30.4  Across MTP joint 30.1  Around proximal great toe 12.5  (Blank rows = not tested)  LLIS:  Flowsheet Row Outpatient Rehab from 06/08/2023 in [facility]  Lymphedema Life Impact Scale Total Score 72.06 %                                              TREATMENT DATE:  07/16/2023 MLD to Rt LE  as follows: Short neck, superficial and deep abdominals, Rt axillary and pectoral nodes, Rt inguino-axillary anastomosis, then Rt LE working from proximal to distal and retracing all steps,  then retracing all steps back to anastomosis.  Compression Bandaging: Cocoa butter applied to Rt lower leg and foot/toes, cut new TG soft size large from foot to knee, Molelast to toes 1-4, artiflex to foot/ankle, 1/2 gray foam ant and post lower leg fixated with artiflex,  then short stretch compression bandages as follows: 1-6 cm to foot/ankle with 1/2 gray foam to dorsal foot/lateral malleolous and wagon wheel to medial malleolous, then 1-8 cm ASH pattern, and 2-12 cm from ankle to below knee. Folded TG soft over top of bandage  07/14/2023 Removed bandages and washed leg with soap and water. MLD to Rt LE  as follows: Short neck, superficial and deep abdominals, Rt axillary and pectoral nodes, Rt inguino-axillary anastomosis, then Rt LE working from proximal to distal and retracing all steps,  then retracing all steps back to anastomosis.  Compression Bandaging: Cocoa butter applied to Rt lower leg and foot/toes, cut new TG soft size large from foot to knee, Molelast to toes 1-4, artiflex to foot/ankle, 1/2 gray foam ant and post lower leg fixated with artiflex, then short stretch compression bandages as follows: 1-6 cm to foot/ankle with 1/2 gray foam to dorsal foot/lateral malleolous and wagon wheel to medial malleolous, then 1-8 cm ASH pattern, and 2-12 cm from ankle to below knee. Folded TG soft over top of bandage  07/13/2023 MLD to Rt LE  as follows: Short neck, superficial and deep abdominals, Rt axillary and pectoral nodes, Rt inguino-axillary anastomosis, then Rt LE working from proximal to distal and retracing all steps,  then retracing all steps back to anastomosis.  Compression Bandaging: Cocoa butter applied to Rt lower leg and foot/toes,  cut new TG soft size large from foot to knee, Molelast to toes 1-4, artiflex to foot/ankle, 1/2 gray foam ant and post lower leg fixated with artiflex, then short stretch compression bandages as follows: 1-6 cm to foot/ankle with 1/2 gray foam to dorsal foot/lateral malleolous and wagon wheel to medial malleolous, then 1-8 cm ASH pattern, and 2-12 cm from ankle to below knee. Folded TG soft over top of bandage  07/09/2023 Removed bandages and washed leg with soap and water. Measured leg  MLD to Rt LE  as follows: Short  neck, superficial and deep abdominals, Rt axillary and pectoral nodes, Rt inguino-axillary anastomosis, then Rt LE working from proximal to distal and retracing all steps,  then retracing all steps back to anastomosis.  Compression Bandaging: Cocoa butter applied to Rt lower leg and foot/toes, cut new TG soft size large from foot to knee, Molelast to toes 1-4, artiflex to foot/ankle, 1/2 gray foam ant and post lower leg fixated with artiflex, then short stretch compression bandages as follows: 1-6 cm to foot/ankle with 1/2 gray foam to dorsal foot/lateral malleolous and wagon wheel to medial malleolous, then 1-8 cm ASH pattern, and 2-12 cm from ankle to below knee. Folded TG soft over top of bandage  07/07/2023 Removed bandages and washed leg with soap and water. MLD to Rt LE  as follows: Short neck, superficial and deep abdominals, Rt axillary and pectoral nodes, Rt inguino-axillary anastomosis, then Rt LE working from proximal to distal and retracing all steps,  then retracing all steps back to anastomosis.  Compression Bandaging: Cocoa butter applied to Rt lower leg and foot/toes, cut new TG soft size large from foot to knee, Molelast to toes 1-4, artiflex to foot/ankle, 1/2 gray foam ant and post lower leg fixated with artiflex, then short stretch compression bandages as follows: 1-6 cm to foot/ankle with 1/2 gray foam to dorsal foot/lateral malleolous and wagon wheel to medial malleolous, then 1-8 cm ASH pattern, and 2-12 cm from ankle to below knee. Folded TG soft over top of bandage.   07/05/23: Manual Therapy Removed bandages and washed leg with soap and water. Remeasured and discussed a velcro - measured for this and will enter into abilico   MLD to Rt LE  as follows: Short neck, superficial and deep abdominals, Rt axillary and pectoral nodes, Rt inguino-axillary anastomosis, then Rt LE working from proximal to distal and retracing all steps,  then retracing all steps back to anastomosis.   Compression Bandaging: Cocoa butter applied to Rt lower leg and foot/toes, cut new TG soft size large from foot to knee, Molelast to toes 1-4, artiflex to foot/ankle, 1/2 gray foam ant and post lower leg fixated with artiflex, then short stretch compression bandages as follows: 1-6 cm to foot/ankle with 1/2 gray foam to dorsal foot/lateral malleolous and wagon wheel to medial malleolous, then 1-8 cm ASH pattern, and 2-12 cm from ankle to below knee. Folded TG soft over top of bandage.   PATIENT EDUCATION:  Education details: education on lymphedema, anatomy and physiology of the lymphatic system, complete CDT and eventual needs for compression garments, educated about need for post op shoe Person educated: Patient and Child(ren) Education method: Explanation Education comprehension: verbalized understanding  HOME EXERCISE PROGRAM: Obtain post op shoe to begin bandaging  ASSESSMENT:  CLINICAL IMPRESSION: Continued CDT. Velcro is now ordered. Leg measurements are holding steady x 2-3 weeks now. Copilot AI showing a change from 14.74L to 10L   OBJECTIVE IMPAIRMENTS: decreased knowledge of condition,  decreased knowledge of use of DME, difficulty walking, decreased ROM, increased edema, and pain.   ACTIVITY LIMITATIONS: bending, sitting, standing, squatting, sleeping, stairs, and transfers  PARTICIPATION LIMITATIONS: meal prep, cleaning, laundry, community activity, and yard work  PERSONAL FACTORS: Age, Fitness, Past/current experiences, and 1 comorbidity: kidney disease are also affecting patient's functional outcome.   REHAB POTENTIAL: Good  CLINICAL DECISION MAKING: Evolving/moderate complexity  EVALUATION COMPLEXITY: Moderate   GOALS: Goals reviewed with patient? Yes  SHORT TERM GOALS: Target date: 07/06/23  Pt will demonstrate a 4 cm reduction in circumferences 30 cm superior to floor at lateral foot bilaterally to decrease risk of infection. Baseline: Goal status:  INITIAL  2.  Pt will report a 25% improvement in pain in bilateral LEs to allow improved comfort.  Baseline:  Goal status: INITIAL    LONG TERM GOALS: Target date: 08/03/23  Pt will demonstrate maximal circumferential reductions as evidenced by no further significant reductions in circumferences to allow pt to be measured for compression garments.  Baseline:  Goal status: INITIAL  2.  Pt will obtain appropriate compression garments for day and night for long term management of lymphedema. Baseline:  Goal status: INITIAL  3.  Pt will benefit from a trial of a compression pump for long term management of lymphedema. Baseline:  Goal status: INITIAL  4.  Pt and/or daughter will be independent in self MLD for long term management of lymphedema. Baseline:  Goal status: INITIAL   PLAN:  PT FREQUENCY: 3x/week  PT DURATION: 8 weeks  PLANNED INTERVENTIONS: [id number]- PT Re-evaluation, 97110-Therapeutic exercises, 97530- Therapeutic activity, 97535- Self Care, [id number]- Manual therapy, 97760- Orthotic Initial, [phone]- Orthotic/Prosthetic subsequent, Patient/Family education, Therapeutic exercises, Therapeutic activity, Neuromuscular re-education, and Self Care  PLAN FOR NEXT SESSION:  Cont complete CDT of Rt LE. Cont to monitor urinary output d/t kidney disease - once maximal reduction gained on Rt and garments are obtained then begin on Lt leg   Kalya Troeger, Durward Gillis, PT 07/16/2023, 11:56 AM

## 2023-07-19 ENCOUNTER — Ambulatory Visit: Admitting: Rehabilitation

## 2023-07-19 ENCOUNTER — Encounter: Payer: Self-pay | Admitting: Rehabilitation

## 2023-07-19 DIAGNOSIS — I890 Lymphedema, not elsewhere classified: Secondary | ICD-10-CM

## 2023-07-19 DIAGNOSIS — C9110 Chronic lymphocytic leukemia of B-cell type not having achieved remission: Secondary | ICD-10-CM

## 2023-07-19 DIAGNOSIS — R262 Difficulty in walking, not elsewhere classified: Secondary | ICD-10-CM

## 2023-07-19 NOTE — Therapy (Addendum)
 OUTPATIENT PHYSICAL THERAPY  LOWER EXTREMITY ONCOLOGY TREATMENT  Patient Name: Michael [name] MRN: [id number] DOB:1930/12/07, 89 y.o., male Today's Date: 07/19/2023  END OF SESSION:  PT End of Session - 07/19/23 1358     Visit Number 16    Number of Visits 25    Date for PT Re-Evaluation 08/03/23    PT Start Time 1200    PT Stop Time 1258    PT Time Calculation (min) 58 min    Activity Tolerance Patient tolerated treatment well    Behavior During Therapy [facility] for tasks assessed/performed            Past Medical History:  Diagnosis Date   Anemia    Anginal pain (HCC)    upon exertion   Arthritis    left shoulder, lower back (10/20/2016)   CLL (chronic lymphoblastic leukemia) dx'd 2000   Edema leg    Family history of adverse reaction to anesthesia    daughter's BP drops    GERD (gastroesophageal reflux disease)    History of hiatal hernia    Leukemia, chronic lymphoid (HCC) 12/08/2010   Lymphocytosis    Type II diabetes mellitus (HCC)    Past Surgical History:  Procedure Laterality Date   BOWEL RESECTION N/A 11/15/2017   Procedure: SMALL BOWEL RESECTION;  Surgeon: Vernetta Berg, MD;  Location: [facility] OR;  Service: General;  Laterality: N/A;   CATARACT EXTRACTION W/ INTRAOCULAR LENS  IMPLANT, BILATERAL Bilateral    EYE SURGERY     Cataracts (bilateral)   GROIN DISSECTION Left 11/15/2017   Procedure: GROIN EXPLORATION WITH REMOVAL OF INGUINAL HERNIA MESH;  Surgeon: Vernetta Berg, MD;  Location: [facility] OR;  Service: General;  Laterality: Left;   INGUINAL HERNIA REPAIR Left 11/10/2017   Procedure: LEFT INGUINAL HERNIA REPAIR ERAS PATHWAY;  Surgeon: Vernetta Berg, MD;  Location: [facility] OR;  Service: General;  Laterality: Left;   INGUINAL HERNIA REPAIR Left 11/15/2017   Procedure: LEFT INGUINAL HERNIA REPAIR WITH MESH;  Surgeon: Vernetta Berg, MD;  Location: [facility] OR;  Service: General;  Laterality: Left;   INSERTION OF MESH Left 11/10/2017   Procedure: INSERTION  OF MESH;  Surgeon: Vernetta Berg, MD;  Location: [facility];  Service: General;  Laterality: Left;   Patient Active Problem List   Diagnosis Date Noted   Iron  deficiency anemia 02/20/2022   Acute on chronic diastolic CHF (congestive heart failure) (HCC) 02/19/2022   Maxillary fracture (HCC) 02/19/2022   Syncope and collapse 02/19/2022   Stage 3a chronic kidney disease (CKD) (HCC) 02/19/2022   Obesity (BMI 30-39.9) 02/19/2022   Superficial retinal hemorrhage of right eye 07/16/2021   Stable treated proliferative diabetic retinopathy of right eye determined by examination associated with type 2 diabetes mellitus (HCC) 10/23/2019   Proliferative diabetic retinopathy of left eye (HCC) 10/23/2019   Chronic kidney disease, stage III (moderate) (HCC) 09/12/2019   Type II diabetes mellitus (HCC)    Acute cystitis without hematuria    Malnutrition of moderate degree 12/21/2017   Hypotension 12/14/2017   Hyponatremia 12/14/2017   ARF (acute renal failure) (HCC) 12/14/2017   Pressure ulcer 11/22/2017   Ileus (HCC) 11/14/2017   S/P left inguinal hernia repair 11/10/2017   Weight loss, non-intentional 04/12/2017   Essential hypertension 10/20/2016   Paresthesias 10/19/2016   Left inguinal hernia 10/12/2016   Protein-calorie malnutrition, mild (HCC) 01/10/2016   Diarrhea due to drug 06/17/2015   Chronic left shoulder pain 06/17/2015   Bilateral leg edema 06/17/2015   Pancytopenia, acquired (HCC) 04/22/2015  Thrombocytopenia (HCC) 10/11/2014   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   CLL (chronic lymphocytic leukemia) (HCC) 12/08/2010    PCP: Lonell Collet, DO  REFERRING PROVIDER:  Lonn Hicks, MD  REFERRING DIAG: C91.10 (ICD-10-CM) - CLL (chronic lymphocytic leukemia) (HCC) R60.0 (ICD-10-CM) - Bilateral leg edema  THERAPY DIAG:  Lymphedema, not elsewhere classified  Difficulty in walking, not elsewhere classified  Leukemia, lymphocytic, chronic (HCC)  ONSET DATE:  12/27/22  Rationale for Evaluation and Treatment: Rehabilitation  SUBJECTIVE:                                                                                                                                                                                           SUBJECTIVE STATEMENT:   Nothing new  PERTINENT HISTORY: CLL (chronic lymphocytic leukemia) (HCC) diagnosed on 04/03/2008. 04/29/2015 - 12/01/2021 Chemotherapy  04/29/2015: Ct scan showed mild lymphadenopathy and splenomegaly 01/08/2016: response to therapy.  06/11/23: Findings raise concern for possible recurrence of CLL - enlarged spleen and lymph nodes - starting new chemotherapy pill 06/27/23  PAIN:  Are you having pain? No, legs just feel sore now PRECAUTIONS: None  RED FLAGS: None   WEIGHT BEARING RESTRICTIONS: No  FALLS:  Has patient fallen in last 6 months? 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  COGNITION: Overall cognitive status: Within functional limits for tasks assessed   PALPATION: Fibrosis present in bilateral LEs  OBSERVATIONS / OTHER ASSESSMENTS: papillomas and hyperkeratosis present    Media Information  Document Information EVAL    07/02/23  POSTURE: forward head, rounded shoulders   LYMPHEDEMA ASSESSMENTS:   LOWER EXTREMITY LANDMARK RIGHT eval 06/18/23 06/25/23 07/02/23 07/09/23 07/16/23  At groin        30 cm proximal to suprapatella        20 cm proximal to  suprapatella 59.5       10 cm proximal to suprapatella 56.1 56.8 55.4 56.5 55.5   At midpatella / popliteal crease 47 48 42.4 46 46 46  40 cm proximal to floor at lateral plantar foot 55.8  46.6 45 45.3 45  30 cm proximal  to floor at lateral plantar foot 53.8 49.3 47.3 45.3 43.5 42.7  20 cm proximal to floor at lateral plantar foot 46.9 44.9 42.4 40.0 38.4 39.7  10 cm proximal to floor at lateral plantar foot 38 34 31.4 31.2 31 32  Circumference of ankle/heel        5 cm proximal to 1st MTP joint 31.5 28.5 27.7 26.5 26.5 27.5  Across MTP joint 29.9 27.9 27.8 26.5 27 27   Around proximal great toe 12 11.0 11.3 10.2 10.2 10  (Blank rows = not tested)  LOWER EXTREMITY LANDMARK LEFT eval  At groin   30 cm proximal to suprapatella   20 cm proximal to suprapatella 57.5  10 cm proximal to suprapatella 55.9  At midpatella / popliteal crease 42.5  40 cm proximal to floor at lateral plantar foot 50.5  30 cm proximal to floor at lateral plantar foot 50  20 cm proximal to floor at lateral plantar foot 42  10 cm proximal to floor at lateral plantar foot 34.2  Circumference of ankle/heel   5 cm proximal to 1st MTP joint 30.4  Across MTP joint 30.1  Around proximal great toe 12.5  (Blank rows = not tested)  LLIS:  Flowsheet Row Outpatient Rehab from 06/08/2023 in [facility]  Lymphedema Life Impact Scale Total Score 72.06 %                                              TREATMENT DATE:  07/19/2023 MLD to Rt LE  as follows: Short neck, superficial and deep abdominals, Rt axillary and pectoral nodes, Rt inguino-axillary anastomosis, then Rt LE working from proximal to distal and retracing all steps,  then retracing all steps back to anastomosis.  Compression Bandaging: Cocoa butter applied to Rt lower leg and foot/toes, cut new TG soft size large from foot to knee, Molelast to toes 1-4, artiflex to foot/ankle, 1/2 gray foam ant and post lower leg fixated with artiflex,  then short stretch compression bandages as follows: 1-6 cm to foot/ankle with 1/2 gray foam to dorsal foot/lateral malleolous and wagon wheel to medial malleolous, then 1-8 cm ASH pattern, and 2-12 cm from ankle to below knee. Folded TG soft over top of bandage  07/16/2023 MLD to Rt LE  as follows: Short neck, superficial and deep abdominals, Rt axillary and pectoral nodes, Rt inguino-axillary anastomosis, then Rt LE working from proximal to distal and retracing all steps,  then retracing all steps back to anastomosis.  Compression Bandaging: Cocoa butter applied to Rt lower leg and foot/toes, cut new TG soft size large from foot to knee, Molelast to toes 1-4, artiflex to foot/ankle, 1/2 gray foam ant and post lower leg fixated with artiflex, then short stretch compression bandages as follows: 1-6 cm to foot/ankle with 1/2 gray foam to dorsal foot/lateral malleolous and wagon wheel to medial malleolous, then 1-8 cm ASH pattern, and 2-12 cm from ankle to below knee. Folded TG soft over top of bandage  07/14/2023 Removed bandages and washed leg with soap and water. MLD to Rt LE  as follows: Short neck, superficial and deep abdominals, Rt axillary and pectoral nodes, Rt inguino-axillary anastomosis, then Rt LE working from proximal to distal and retracing all steps,  then retracing all steps back to anastomosis.  Compression Bandaging: Cocoa butter applied to Rt lower leg and foot/toes,  cut new TG soft size large from foot to knee, Molelast to toes 1-4, artiflex to foot/ankle, 1/2 gray foam ant and post lower leg fixated with artiflex, then short stretch compression bandages as follows: 1-6 cm to foot/ankle with 1/2 gray foam to dorsal foot/lateral malleolous and wagon wheel to medial malleolous, then 1-8 cm ASH pattern, and 2-12 cm from ankle to below knee. Folded TG soft over top of bandage  PATIENT EDUCATION:  Education details: education on lymphedema, anatomy and physiology of the lymphatic system,  complete CDT and eventual needs for compression garments, educated about need for post op shoe Person educated: Patient and Child(ren) Education method: Explanation Education comprehension: verbalized understanding  HOME EXERCISE PROGRAM: Obtain post op shoe to begin bandaging  ASSESSMENT:  CLINICAL IMPRESSION: Continued CDT. Velcro is now ordered.  Pt is getting tired of the amount of PT appointments needed for CDT and wants to investigate getting velcro for the left leg next visit.  Seeing where he fits in the size chart and the amount of reduction needed.  Emailed [employer] regarding status. [employer] just says processing.   OBJECTIVE IMPAIRMENTS: decreased knowledge of condition, decreased knowledge of use of DME, difficulty walking, decreased ROM, increased edema, and pain.   ACTIVITY LIMITATIONS: bending, sitting, standing, squatting, sleeping, stairs, and transfers  PARTICIPATION LIMITATIONS: meal prep, cleaning, laundry, community activity, and yard work  PERSONAL FACTORS: Age, Fitness, Past/current experiences, and 1 comorbidity: kidney disease are also affecting patient's functional outcome.   REHAB POTENTIAL: Good  CLINICAL DECISION MAKING: Evolving/moderate complexity  EVALUATION COMPLEXITY: Moderate   GOALS: Goals reviewed with patient? Yes  SHORT TERM GOALS: Target date: 07/06/23  Pt will demonstrate a 4 cm reduction in circumferences 30 cm superior to floor at lateral foot bilaterally to decrease risk of infection. Baseline: Goal status: INITIAL  2.  Pt will report a 25% improvement in pain in bilateral LEs to allow improved comfort.  Baseline:  Goal status: INITIAL    LONG TERM GOALS: Target date: 08/03/23  Pt will demonstrate maximal circumferential reductions as evidenced by no further significant reductions in circumferences to allow pt to be measured for compression garments.  Baseline:  Goal status: INITIAL  2.  Pt will obtain appropriate  compression garments for day and night for long term management of lymphedema. Baseline:  Goal status: INITIAL  3.  Pt will benefit from a trial of a compression pump for long term management of lymphedema. Baseline:  Goal status: INITIAL  4.  Pt and/or daughter will be independent in self MLD for long term management of lymphedema. Baseline:  Goal status: INITIAL   PLAN:  PT FREQUENCY: 3x/week  PT DURATION: 8 weeks  PLANNED INTERVENTIONS: [id number]- PT Re-evaluation, 97110-Therapeutic exercises, 97530- Therapeutic activity, 97535- Self Care, [id number]- Manual therapy, 97760- Orthotic Initial, [phone]- Orthotic/Prosthetic subsequent, Patient/Family education, Therapeutic exercises, Therapeutic activity, Neuromuscular re-education, and Self Care  PLAN FOR NEXT SESSION:  Cont complete CDT of Rt LE. Cont to monitor urinary output d/t kidney disease - once maximal reduction gained on Rt and garments are obtained then begin on Lt leg   Larue Saddie SAUNDERS, PT 07/19/2023, 3:31 PM

## 2023-07-20 ENCOUNTER — Encounter: Payer: Self-pay | Admitting: Hematology and Oncology

## 2023-07-20 ENCOUNTER — Inpatient Hospital Stay

## 2023-07-20 ENCOUNTER — Inpatient Hospital Stay (HOSPITAL_BASED_OUTPATIENT_CLINIC_OR_DEPARTMENT_OTHER): Admitting: Hematology and Oncology

## 2023-07-20 ENCOUNTER — Other Ambulatory Visit (HOSPITAL_COMMUNITY): Payer: Self-pay

## 2023-07-20 ENCOUNTER — Other Ambulatory Visit: Payer: Self-pay

## 2023-07-20 VITALS — BP 107/43 | HR 70 | Temp 97.8°F | Resp 18 | Ht 67.0 in | Wt 202.0 lb

## 2023-07-20 DIAGNOSIS — C9110 Chronic lymphocytic leukemia of B-cell type not having achieved remission: Secondary | ICD-10-CM

## 2023-07-20 DIAGNOSIS — R161 Splenomegaly, not elsewhere classified: Secondary | ICD-10-CM | POA: Diagnosis not present

## 2023-07-20 DIAGNOSIS — D61818 Other pancytopenia: Secondary | ICD-10-CM

## 2023-07-20 LAB — COMPREHENSIVE METABOLIC PANEL WITH GFR
ALT: 5 U/L (ref 0–44)
AST: 13 U/L — ABNORMAL LOW (ref 15–41)
Albumin: 3.7 g/dL (ref 3.5–5.0)
Alkaline Phosphatase: 47 U/L (ref 38–126)
Anion gap: 6 (ref 5–15)
BUN: 21 mg/dL (ref 8–23)
CO2: 29 mmol/L (ref 22–32)
Calcium: 8.6 mg/dL — ABNORMAL LOW (ref 8.9–10.3)
Chloride: 107 mmol/L (ref 98–111)
Creatinine, Ser: 1.35 mg/dL — ABNORMAL HIGH (ref 0.61–1.24)
GFR, Estimated: 50 mL/min — ABNORMAL LOW (ref >60)
Glucose, Bld: 77 mg/dL (ref 70–99)
Potassium: 3.9 mmol/L (ref 3.5–5.1)
Sodium: 142 mmol/L (ref 135–145)
Total Bilirubin: 0.4 mg/dL (ref 0.0–1.2)
Total Protein: 6.7 g/dL (ref 6.5–8.1)

## 2023-07-20 LAB — CBC WITH DIFFERENTIAL/PLATELET
Abs Immature Granulocytes: 0 10*3/uL (ref 0.00–0.07)
Basophils Absolute: 0 10*3/uL (ref 0.0–0.1)
Basophils Relative: 0 %
Eosinophils Absolute: 0 10*3/uL (ref 0.0–0.5)
Eosinophils Relative: 0 %
HCT: 30.2 % — ABNORMAL LOW (ref 39.0–52.0)
Hemoglobin: 10.5 g/dL — ABNORMAL LOW (ref 13.0–17.0)
Lymphocytes Relative: 52 %
Lymphs Abs: 3.1 10*3/uL (ref 0.7–4.0)
MCH: 31.3 pg (ref 26.0–34.0)
MCHC: 34.8 g/dL (ref 30.0–36.0)
MCV: 90.1 fL (ref 80.0–100.0)
Monocytes Absolute: 0.2 10*3/uL (ref 0.1–1.0)
Monocytes Relative: 3 %
Neutro Abs: 2.7 10*3/uL (ref 1.7–7.7)
Neutrophils Relative %: 45 %
Platelets: 62 10*3/uL — ABNORMAL LOW (ref 150–400)
RBC: 3.35 MIL/uL — ABNORMAL LOW (ref 4.22–5.81)
RDW: 14.8 % (ref 11.5–15.5)
Smear Review: NORMAL
WBC: 6 10*3/uL (ref 4.0–10.5)
nRBC: 0 % (ref 0.0–0.2)

## 2023-07-20 NOTE — Progress Notes (Addendum)
 [facility] OFFICE PROGRESS NOTE  Patient Care Team: [name], DO as PCP - General (Family Medicine) Lonn Hicks, MD as Consulting Physician (Hematology and Oncology)  Assessment & Plan CLL (chronic lymphocytic leukemia) [facility]) The patient was diagnosed with CLL in 2010, Rai stage II with lymphadenopathy and splenomegaly, FISH analysis showed trisomy 12 He received ibrutinib  between April 2017 to November 2023  CT imaging from May showed progressive splenomegaly and lymphadenopathy Calquence  was started on June 1 Treatment was recently interrupted due to progressive thrombocytopenia His blood counts are improving but the patient is weak due to viral sinus congestion I recommend holding treatment for another week and resume on July 1 I will see him the week after for toxicity review Pancytopenia, acquired [facility]) This is likely due to the bone marrow disease. The patient denies recent history of bleeding such as epistaxis, hematuria or hematochezia. He is asymptomatic from the anemia & low platelets.   As above, his treatment is placed on hold for another week, to resume Calquence  on July 1 I will see him on July 10 for further review   No orders of the defined types were placed in this encounter.    Hicks Lonn, MD  INTERVAL HISTORY: he returns for treatment follow-up Complications related to previous cycle of chemotherapy included pancytopenia,, infection,, and leg edema He developed sinus congestion a few days ago No fever or chills Denies recent bleeding We discussed timing of resumption of Calquence  and I review his blood work today PHYSICAL EXAMINATION: ECOG PERFORMANCE STATUS: 1 - Symptomatic but completely ambulatory  No results found for: CAN125    Latest Ref Rng & Units 07/20/2023   12:01 PM 07/13/2023   11:29 AM 07/06/2023    1:10 PM  CBC  WBC 4.0 - 10.5 K/uL 6.0  6.3  10.0   Hemoglobin 13.0 - 17.0 g/dL 89.4  9.9  89.6   Hematocrit 39.0 - 52.0  % 30.2  29.3  30.2   Platelets 150 - 400 K/uL 62  59  69       Chemistry      Component Value Date/Time   NA 142 07/20/2023 1201   NA 140 10/09/2016 1106   K 3.9 07/20/2023 1201   K 4.7 10/09/2016 1106   CL 107 07/20/2023 1201   CL 104 03/28/2012 1426   CO2 29 07/20/2023 1201   CO2 28 10/09/2016 1106   BUN 21 07/20/2023 1201   BUN 20.8 10/09/2016 1106   CREATININE 1.35 (H) 07/20/2023 1201   CREATININE 1.30 (H) 06/02/2022 1123   CREATININE 1.1 10/09/2016 1106      Component Value Date/Time   CALCIUM  8.6 (L) 07/20/2023 1201   CALCIUM  9.1 10/09/2016 1106   ALKPHOS 47 07/20/2023 1201   ALKPHOS 46 10/09/2016 1106   AST 13 (L) 07/20/2023 1201   AST 16 06/02/2022 1123   AST 17 10/09/2016 1106   ALT 5 07/20/2023 1201   ALT 8 06/02/2022 1123   ALT 8 10/09/2016 1106   BILITOT 0.4 07/20/2023 1201   BILITOT 0.5 06/02/2022 1123   BILITOT 0.23 10/09/2016 1106

## 2023-07-20 NOTE — Assessment & Plan Note (Addendum)
 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 The patient denies recent history of bleeding such as epistaxis, hematuria or hematochezia. He is asymptomatic from the anemia & low platelets.   As above, his treatment is placed on hold for another week, to resume Calquence  on July 1 I will see him on July 10 for further review

## 2023-07-20 NOTE — Assessment & Plan Note (Addendum)
 The patient was diagnosed with CLL in 2010, Rai stage II with lymphadenopathy and splenomegaly, FISH analysis showed trisomy 102 He received ibrutinib  between April 2017 to November 2023  CT imaging from May showed progressive splenomegaly and lymphadenopathy Calquence  was started on June 1 Treatment was recently interrupted due to progressive thrombocytopenia His blood counts are improving but the patient is weak due to viral sinus congestion I recommend holding treatment for another week and resume on July 1 I will see him the week after for toxicity review

## 2023-07-21 ENCOUNTER — Ambulatory Visit

## 2023-07-21 ENCOUNTER — Other Ambulatory Visit (HOSPITAL_COMMUNITY): Payer: Self-pay

## 2023-07-21 ENCOUNTER — Other Ambulatory Visit: Payer: Self-pay

## 2023-07-21 DIAGNOSIS — R262 Difficulty in walking, not elsewhere classified: Secondary | ICD-10-CM

## 2023-07-21 DIAGNOSIS — I890 Lymphedema, not elsewhere classified: Secondary | ICD-10-CM

## 2023-07-21 DIAGNOSIS — C9110 Chronic lymphocytic leukemia of B-cell type not having achieved remission: Secondary | ICD-10-CM

## 2023-07-21 NOTE — Therapy (Signed)
 OUTPATIENT PHYSICAL THERAPY  LOWER EXTREMITY ONCOLOGY TREATMENT  Patient Name: Michael [name] MRN: [id number] DOB:[birth year], 89 y.o., male Today's Date: 07/21/2023  END OF SESSION:  PT End of Session - 07/21/23 1218     Visit Number 17    Number of Visits 25    Date for PT Re-Evaluation 08/03/23    PT Start Time 1100    PT Stop Time 1202    PT Time Calculation (min) 62 min    Activity Tolerance Patient tolerated treatment well    Behavior During Therapy [facility] for tasks assessed/performed            Past Medical History:  Diagnosis Date   Anemia    Anginal pain (HCC)    upon exertion   Arthritis    left shoulder, lower back (10/20/2016)   CLL (chronic lymphoblastic leukemia) dx'd 2000   Edema leg    Family history of adverse reaction to anesthesia    daughter's BP drops    GERD (gastroesophageal reflux disease)    History of hiatal hernia    Leukemia, chronic lymphoid (HCC) 12/08/2010   Lymphocytosis    Type II diabetes mellitus (HCC)    Past Surgical History:  Procedure Laterality Date   BOWEL RESECTION N/A 11/15/2017   Procedure: SMALL BOWEL RESECTION;  Surgeon: Vernetta Berg, MD;  Location: [facility] OR;  Service: General;  Laterality: N/A;   CATARACT EXTRACTION W/ INTRAOCULAR LENS  IMPLANT, BILATERAL Bilateral    EYE SURGERY     Cataracts (bilateral)   GROIN DISSECTION Left 11/15/2017   Procedure: GROIN EXPLORATION WITH REMOVAL OF INGUINAL HERNIA MESH;  Surgeon: Vernetta Berg, MD;  Location: [facility] OR;  Service: General;  Laterality: Left;   INGUINAL HERNIA REPAIR Left 11/10/2017   Procedure: LEFT INGUINAL HERNIA REPAIR ERAS PATHWAY;  Surgeon: Vernetta Berg, MD;  Location: [facility] OR;  Service: General;  Laterality: Left;   INGUINAL HERNIA REPAIR Left 11/15/2017   Procedure: LEFT INGUINAL HERNIA REPAIR WITH MESH;  Surgeon: Vernetta Berg, MD;  Location: [facility] OR;  Service: General;  Laterality: Left;   INSERTION OF MESH Left 11/10/2017   Procedure: INSERTION  OF MESH;  Surgeon: Vernetta Berg, MD;  Location: [facility] OR;  Service: General;  Laterality: Left;   Patient Active Problem List   Diagnosis Date Noted   Iron  deficiency anemia 02/20/2022   Acute on chronic diastolic CHF (congestive heart failure) (HCC) 02/19/2022   Maxillary fracture (HCC) 02/19/2022   Syncope and collapse 02/19/2022   Stage 3a chronic kidney disease (CKD) (HCC) 02/19/2022   Obesity (BMI 30-39.9) 02/19/2022   Superficial retinal hemorrhage of right eye 07/16/2021   Stable treated proliferative diabetic retinopathy of right eye determined by examination associated with type 2 diabetes mellitus (HCC) 10/23/2019   Proliferative diabetic retinopathy of left eye (HCC) 10/23/2019   Chronic kidney disease, stage III (moderate) (HCC) 09/12/2019   Type II diabetes mellitus (HCC)    Acute cystitis without hematuria    Malnutrition of moderate degree 12/21/2017   Hypotension 12/14/2017   Hyponatremia 12/14/2017   ARF (acute renal failure) (HCC) 12/14/2017   Pressure ulcer 11/22/2017   Ileus (HCC) 11/14/2017   S/P left inguinal hernia repair 11/10/2017   Weight loss, non-intentional 04/12/2017   Essential hypertension 10/20/2016   Paresthesias 10/19/2016   Left inguinal hernia 10/12/2016   Protein-calorie malnutrition, mild (HCC) 01/10/2016   Diarrhea due to drug 06/17/2015   Chronic left shoulder pain 06/17/2015   Bilateral leg edema 06/17/2015   Pancytopenia, acquired (HCC) 04/22/2015  Thrombocytopenia (HCC) 10/11/2014   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   CLL (chronic lymphocytic leukemia) (HCC) 12/08/2010    PCP: Lonell Collet, DO  REFERRING PROVIDER:  Lonn Hicks, MD  REFERRING DIAG: C91.10 (ICD-10-CM) - CLL (chronic lymphocytic leukemia) (HCC) R60.0 (ICD-10-CM) - Bilateral leg edema  THERAPY DIAG:  Lymphedema, not elsewhere classified  Difficulty in walking, not elsewhere classified  Leukemia, lymphocytic, chronic (HCC)  ONSET DATE:  12/27/22  Rationale for Evaluation and Treatment: Rehabilitation  SUBJECTIVE:                                                                                                                                                                                           SUBJECTIVE STATEMENT:   Nothing new  PERTINENT HISTORY: CLL (chronic lymphocytic leukemia) (HCC) diagnosed on 04/03/2008. 04/29/2015 - 12/01/2021 Chemotherapy  04/29/2015: Ct scan showed mild lymphadenopathy and splenomegaly 01/08/2016: response to therapy.  06/11/23: Findings raise concern for possible recurrence of CLL - enlarged spleen and lymph nodes - starting new chemotherapy pill 06/27/23  PAIN:  Are you having pain? No, legs just feel sore now PRECAUTIONS: None  RED FLAGS: None   WEIGHT BEARING RESTRICTIONS: No  FALLS:  Has patient fallen in last 6 months? 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  COGNITION: Overall cognitive status: Within functional limits for tasks assessed   PALPATION: Fibrosis present in bilateral LEs  OBSERVATIONS / OTHER ASSESSMENTS: papillomas and hyperkeratosis present    Media Information  Document Information EVAL    07/02/23  POSTURE: forward head, rounded shoulders   LYMPHEDEMA ASSESSMENTS:   LOWER EXTREMITY LANDMARK RIGHT eval 06/18/23 06/25/23 07/02/23 07/09/23 07/16/23  At groin        30 cm proximal to suprapatella        20 cm proximal to  suprapatella 59.5       10 cm proximal to suprapatella 56.1 56.8 55.4 56.5 55.5   At midpatella / popliteal crease 47 48 42.4 46 46 46  40 cm proximal to floor at lateral plantar foot 55.8  46.6 45 45.3 45  30 cm proximal  to floor at lateral plantar foot 53.8 49.3 47.3 45.3 43.5 42.7  20 cm proximal to floor at lateral plantar foot 46.9 44.9 42.4 40.0 38.4 39.7  10 cm proximal to floor at lateral plantar foot 38 34 31.4 31.2 31 32  Circumference of ankle/heel        5 cm proximal to 1st MTP joint 31.5 28.5 27.7 26.5 26.5 27.5  Across MTP joint 29.9 27.9 27.8 26.5 27 27   Around proximal great toe 12 11.0 11.3 10.2 10.2 10  (Blank rows = not tested)  LOWER EXTREMITY LANDMARK LEFT eval  At groin   30 cm proximal to suprapatella   20 cm proximal to suprapatella 57.5  10 cm proximal to suprapatella 55.9  At midpatella / popliteal crease 42.5  40 cm proximal to floor at lateral plantar foot 50.5  30 cm proximal to floor at lateral plantar foot 50  20 cm proximal to floor at lateral plantar foot 42  10 cm proximal to floor at lateral plantar foot 34.2  Circumference of ankle/heel   5 cm proximal to 1st MTP joint 30.4  Across MTP joint 30.1  Around proximal great toe 12.5  (Blank rows = not tested)  LLIS:  Flowsheet Row Outpatient Rehab from 06/08/2023 in [facility]  Lymphedema Life Impact Scale Total Score 72.06 %                                              TREATMENT DATE:  07/19/2023 MLD to Rt LE  as follows: Short neck, superficial and deep abdominals, Rt axillary and pectoral nodes, Rt inguino-axillary anastomosis, then Rt LE working from proximal to distal and retracing all steps,  then retracing all steps back to anastomosis.  Compression Bandaging: Cocoa butter applied to Rt lower leg and foot/toes, TG soft size large from foot to knee, Molelast to toes 1-4, artiflex to foot/ankle, 1/2 gray foam ant and post lower leg fixated with Idealbinde, then  short stretch compression bandages as follows: 1-6 cm to foot/ankle with 1/2 gray foam to dorsal foot/lateral malleolous and wagon wheel to medial malleolous, then 1-8 cm ASH pattern, and 2-12 cm from ankle to below knee, first in herring bone and second spiral. Folded TG soft over top of bandage  07/16/2023 MLD to Rt LE  as follows: Short neck, superficial and deep abdominals, Rt axillary and pectoral nodes, Rt inguino-axillary anastomosis, then Rt LE working from proximal to distal and retracing all steps,  then retracing all steps back to anastomosis.  Compression Bandaging: Cocoa butter applied to Rt lower leg and foot/toes, cut new TG soft size large from foot to knee, Molelast to toes 1-4, artiflex to foot/ankle, 1/2 gray foam ant and post lower leg fixated with artiflex, then short stretch compression bandages as follows: 1-6 cm to foot/ankle with 1/2 gray foam to dorsal foot/lateral malleolous and wagon wheel to medial malleolous, then 1-8 cm ASH pattern, and 2-12 cm from ankle to below knee. Folded TG soft over top of bandage  07/14/2023 Removed bandages and washed leg with soap and water. MLD to Rt LE  as follows: Short neck, superficial and deep abdominals, Rt axillary and pectoral nodes, Rt inguino-axillary anastomosis, then Rt LE working from proximal to distal and retracing all steps,  then retracing all steps back to anastomosis.  Compression Bandaging: Cocoa butter applied to  Rt lower leg and foot/toes, cut new TG soft size large from foot to knee, Molelast to toes 1-4, artiflex to foot/ankle, 1/2 gray foam ant and post lower leg fixated with artiflex, then short stretch compression bandages as follows: 1-6 cm to foot/ankle with 1/2 gray foam to dorsal foot/lateral malleolous and wagon wheel to medial malleolous, then 1-8 cm ASH pattern, and 2-12 cm from ankle to below knee. Folded TG soft over top of bandage  PATIENT EDUCATION:  Education details: education on lymphedema, anatomy and  physiology of the lymphatic system, complete CDT and eventual needs for compression garments, educated about need for post op shoe Person educated: Patient and Child(ren) Education method: Explanation Education comprehension: verbalized understanding  HOME EXERCISE PROGRAM: Obtain post op shoe to begin bandaging  ASSESSMENT:  CLINICAL IMPRESSION: Pt paid for his portion of garment this morning over the phone before arriving and was told this should arrive in 7-10 business days. Continued with Rt LE CDT and added Idealbinde to fixate foam as we were out of this when we began therapy but have some now.  OBJECTIVE IMPAIRMENTS: decreased knowledge of condition, decreased knowledge of use of DME, difficulty walking, decreased ROM, increased edema, and pain.   ACTIVITY LIMITATIONS: bending, sitting, standing, squatting, sleeping, stairs, and transfers  PARTICIPATION LIMITATIONS: meal prep, cleaning, laundry, community activity, and yard work  PERSONAL FACTORS: Age, Fitness, Past/current experiences, and 1 comorbidity: kidney disease are also affecting patient's functional outcome.   REHAB POTENTIAL: Good  CLINICAL DECISION MAKING: Evolving/moderate complexity  EVALUATION COMPLEXITY: Moderate   GOALS: Goals reviewed with patient? Yes  SHORT TERM GOALS: Target date: 07/06/23  Pt will demonstrate a 4 cm reduction in circumferences 30 cm superior to floor at lateral foot bilaterally to decrease risk of infection. Baseline: Goal status: INITIAL  2.  Pt will report a 25% improvement in pain in bilateral LEs to allow improved comfort.  Baseline:  Goal status: INITIAL    LONG TERM GOALS: Target date: 08/03/23  Pt will demonstrate maximal circumferential reductions as evidenced by no further significant reductions in circumferences to allow pt to be measured for compression garments.  Baseline:  Goal status: INITIAL  2.  Pt will obtain appropriate compression garments for day and night  for long term management of lymphedema. Baseline:  Goal status: INITIAL  3.  Pt will benefit from a trial of a compression pump for long term management of lymphedema. Baseline:  Goal status: INITIAL  4.  Pt and/or daughter will be independent in self MLD for long term management of lymphedema. Baseline:  Goal status: INITIAL   PLAN:  PT FREQUENCY: 3x/week  PT DURATION: 8 weeks  PLANNED INTERVENTIONS: 97164- PT Re-evaluation, 97110-Therapeutic exercises, 97530- Therapeutic activity, 97535- Self Care, [id number]- Manual therapy, 97760- Orthotic Initial, [phone]- Orthotic/Prosthetic subsequent, Patient/Family education, Therapeutic exercises, Therapeutic activity, Neuromuscular re-education, and Self Care  PLAN FOR NEXT SESSION:  Cont complete CDT of Rt LE until velcro garment arrives. Cont to monitor urinary output d/t kidney disease. Once garment arrives begin bandaging Lt LE, should be able to use same leg foam but will need new dorsal foot foam cut for Lt side.   Aden Berwyn Caldron, PTA 07/21/2023, 1:24 PM

## 2023-07-21 NOTE — Progress Notes (Signed)
 Specialty Pharmacy Refill Coordination Note  Michael [name] is a 89 y.o. male contacted today regarding refills of specialty medication(s) Acalabrutinib  Maleate (CALQUENCE )   Patient requested Delivery   Delivery date: 07/28/23   Verified address: [address] [place] [address]   Medication will be filled on 07/27/23.

## 2023-07-21 NOTE — Progress Notes (Signed)
 Specialty Pharmacy Ongoing Clinical Assessment Note  Michael [name] is a 89 y.o. male who is being followed by the specialty pharmacy service for RxSp Oncology   Patient's specialty medication(s) reviewed today: Acalabrutinib  Maleate (CALQUENCE )   Missed doses in the last 4 weeks: 0   Patient/Caregiver did not have any additional questions or concerns.   Therapeutic benefit summary: Patient is achieving benefit   Adverse events/side effects summary: No adverse events/side effects   Patient's therapy is appropriate to: Continue    Goals Addressed             This Visit's Progress    Maintain optimal adherence to therapy   On track    Patient is on track. Patient will maintain adherence         Follow up: 3 months  [facility] Specialty Pharmacist

## 2023-07-23 ENCOUNTER — Ambulatory Visit

## 2023-07-23 DIAGNOSIS — I890 Lymphedema, not elsewhere classified: Secondary | ICD-10-CM | POA: Diagnosis not present

## 2023-07-23 DIAGNOSIS — C9110 Chronic lymphocytic leukemia of B-cell type not having achieved remission: Secondary | ICD-10-CM

## 2023-07-23 DIAGNOSIS — R262 Difficulty in walking, not elsewhere classified: Secondary | ICD-10-CM

## 2023-07-23 NOTE — Therapy (Signed)
 OUTPATIENT PHYSICAL THERAPY  LOWER EXTREMITY ONCOLOGY TREATMENT  Patient Name: Michael [name] MRN: [id number] DOB:Sep 19, 1930, 89 y.o., male Today's Date: 07/23/2023  END OF SESSION:  PT End of Session - 07/23/23 1116     Visit Number 18    Number of Visits 25    Date for PT Re-Evaluation 08/03/23    PT Start Time 1105    PT Stop Time 1205    PT Time Calculation (min) 60 min    Activity Tolerance Patient tolerated treatment well    Behavior During Therapy [facility] for tasks assessed/performed            Past Medical History:  Diagnosis Date   Anemia    Anginal pain (HCC)    upon exertion   Arthritis    left shoulder, lower back (10/20/2016)   CLL (chronic lymphoblastic leukemia) dx'd 2000   Edema leg    Family history of adverse reaction to anesthesia    daughter's BP drops    GERD (gastroesophageal reflux disease)    History of hiatal hernia    Leukemia, chronic lymphoid (HCC) 12/08/2010   Lymphocytosis    Type II diabetes mellitus (HCC)    Past Surgical History:  Procedure Laterality Date   BOWEL RESECTION N/A 11/15/2017   Procedure: SMALL BOWEL RESECTION;  Surgeon: Vernetta Berg, MD;  Location: [facility] OR;  Service: General;  Laterality: N/A;   CATARACT EXTRACTION W/ INTRAOCULAR LENS  IMPLANT, BILATERAL Bilateral    EYE SURGERY     Cataracts (bilateral)   GROIN DISSECTION Left 11/15/2017   Procedure: GROIN EXPLORATION WITH REMOVAL OF INGUINAL HERNIA MESH;  Surgeon: Vernetta Berg, MD;  Location: [facility] OR;  Service: General;  Laterality: Left;   INGUINAL HERNIA REPAIR Left 11/10/2017   Procedure: LEFT INGUINAL HERNIA REPAIR ERAS PATHWAY;  Surgeon: Vernetta Berg, MD;  Location: [facility] OR;  Service: General;  Laterality: Left;   INGUINAL HERNIA REPAIR Left 11/15/2017   Procedure: LEFT INGUINAL HERNIA REPAIR WITH MESH;  Surgeon: Vernetta Berg, MD;  Location: [facility] OR;  Service: General;  Laterality: Left;   INSERTION OF MESH Left 11/10/2017   Procedure: INSERTION  OF MESH;  Surgeon: Vernetta Berg, MD;  Location: [facility] OR;  Service: General;  Laterality: Left;   Patient Active Problem List   Diagnosis Date Noted   Iron  deficiency anemia 02/20/2022   Acute on chronic diastolic CHF (congestive heart failure) (HCC) 02/19/2022   Maxillary fracture (HCC) 02/19/2022   Syncope and collapse 02/19/2022   Stage 3a chronic kidney disease (CKD) (HCC) 02/19/2022   Obesity (BMI 30-39.9) 02/19/2022   Superficial retinal hemorrhage of right eye 07/16/2021   Stable treated proliferative diabetic retinopathy of right eye determined by examination associated with type 2 diabetes mellitus (HCC) 10/23/2019   Proliferative diabetic retinopathy of left eye (HCC) 10/23/2019   Chronic kidney disease, stage III (moderate) (HCC) 09/12/2019   Type II diabetes mellitus (HCC)    Acute cystitis without hematuria    Malnutrition of moderate degree 12/21/2017   Hypotension 12/14/2017   Hyponatremia 12/14/2017   ARF (acute renal failure) (HCC) 12/14/2017   Pressure ulcer 11/22/2017   Ileus (HCC) 11/14/2017   S/P left inguinal hernia repair 11/10/2017   Weight loss, non-intentional 04/12/2017   Essential hypertension 10/20/2016   Paresthesias 10/19/2016   Left inguinal hernia 10/12/2016   Protein-calorie malnutrition, mild (HCC) 01/10/2016   Diarrhea due to drug 06/17/2015   Chronic left shoulder pain 06/17/2015   Bilateral leg edema 06/17/2015   Pancytopenia, acquired (HCC) 04/22/2015  Thrombocytopenia (HCC) 10/11/2014   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   CLL (chronic lymphocytic leukemia) (HCC) 12/08/2010    PCP: Lonell Collet, DO  REFERRING PROVIDER:  Lonn Hicks, MD  REFERRING DIAG: C91.10 (ICD-10-CM) - CLL (chronic lymphocytic leukemia) (HCC) R60.0 (ICD-10-CM) - Bilateral leg edema  THERAPY DIAG:  Lymphedema, not elsewhere classified  Difficulty in walking, not elsewhere classified  Leukemia, lymphocytic, chronic (HCC)  ONSET DATE:  12/27/22  Rationale for Evaluation and Treatment: Rehabilitation  SUBJECTIVE:                                                                                                                                                                                           SUBJECTIVE STATEMENT:  The bandage stayed on really well and my toes felt better.   PERTINENT HISTORY: CLL (chronic lymphocytic leukemia) (HCC) diagnosed on 04/03/2008. 04/29/2015 - 12/01/2021 Chemotherapy  04/29/2015: Ct scan showed mild lymphadenopathy and splenomegaly 01/08/2016: response to therapy.  06/11/23: Findings raise concern for possible recurrence of CLL - enlarged spleen and lymph nodes - starting new chemotherapy pill 06/27/23  PAIN:  Are you having pain? No, legs just feel sore now  PRECAUTIONS: None  RED FLAGS: None   WEIGHT BEARING RESTRICTIONS: No  FALLS:  Has patient fallen in last 6 months? 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  COGNITION: Overall cognitive status: Within functional limits for tasks assessed   PALPATION: Fibrosis present in bilateral LEs  OBSERVATIONS / OTHER ASSESSMENTS: papillomas and hyperkeratosis present    Media Information  Document Information EVAL    07/02/23  POSTURE: forward head, rounded shoulders   LYMPHEDEMA ASSESSMENTS:   LOWER EXTREMITY LANDMARK RIGHT eval 06/18/23 06/25/23 07/02/23 07/09/23 07/16/23 07/23/23  At groin          30 cm proximal to suprapatella         20 cm proximal to suprapatella 59.5        10 cm proximal to suprapatella 56.1 56.8 55.4 56.5 55.5  54.2  At midpatella / popliteal crease 47 48 42.4 46 46 46 43.9  40 cm proximal  to floor at lateral plantar foot 55.8  46.6 45 45.3 45 41.5  30 cm proximal to floor at lateral plantar foot 53.8 49.3 47.3 45.3 43.5 42.7 37.8  20 cm proximal to floor at lateral plantar foot 46.9 44.9 42.4 40.0 38.4 39.7 35.2  10 cm proximal to floor at lateral plantar foot 38 34 31.4 31.2 31 32 28.4  Circumference of ankle/heel         5 cm proximal to 1st MTP joint 31.5 28.5 27.7 26.5 26.5 27.5 26.3  Across MTP joint 29.9 27.9 27.8 26.5 27 27  25.8  Around proximal great toe 12 11.0 11.3 10.2 10.2 10 10   (Blank rows = not tested)  LOWER EXTREMITY LANDMARK LEFT eval  At groin   30 cm proximal to suprapatella   20 cm proximal to suprapatella 57.5  10 cm proximal to suprapatella 55.9  At midpatella / popliteal crease 42.5  40 cm proximal to floor at lateral plantar foot 50.5  30 cm proximal to floor at lateral plantar foot 50  20 cm proximal to floor at lateral plantar foot 42  10 cm proximal to floor at lateral plantar foot 34.2  Circumference of ankle/heel   5 cm proximal to 1st MTP joint 30.4  Across MTP joint 30.1  Around proximal great toe 12.5  (Blank rows = not tested)  LLIS:  Flowsheet Row Outpatient Rehab from 06/08/2023 in [facility]  Lymphedema Life Impact Scale Total Score 72.06 %                                              TREATMENT DATE:  07/23/23: Manual Therapy Weekly circumference measurements taken, excellent reductions since start of care! MLD to Rt LE  as follows: Short neck, superficial and deep abdominals, Rt axillary and pectoral nodes, Rt inguino-axillary anastomosis, then Rt LE working from proximal to distal and retracing all steps,  then retracing all steps back to anastomosis.  Compression Bandaging:  Cocoa butter applied to Rt lower leg and foot/toes, TG soft size large from foot to knee, Molelast to toes 1-4, artiflex to foot/ankle, 1/2 gray foam ant and post lower leg fixated with Idealbinde, then short stretch compression bandages as follows: 1-6 cm to foot/ankle with 1/2 gray foam to dorsal foot/lateral malleolous and wagon wheel to medial malleolous, then 1-8 cm ASH pattern, and 2-12 cm from ankle to below knee, first in herring bone and second spiral. Folded TG soft over top of bandage  07/19/2023 MLD to Rt LE  as follows: Short neck, superficial and deep abdominals, Rt axillary and pectoral nodes, Rt inguino-axillary anastomosis, then Rt LE working from proximal to distal and retracing all steps,  then retracing all steps back to anastomosis.  Compression Bandaging: Cocoa butter applied to Rt lower leg and foot/toes, TG soft size large from foot to knee, Molelast to toes 1-4, artiflex to foot/ankle, 1/2 gray foam ant and post lower leg fixated with Idealbinde, then short stretch compression bandages as follows: 1-6 cm to foot/ankle with 1/2 gray foam to dorsal foot/lateral malleolous and wagon wheel to medial malleolous, then 1-8 cm ASH pattern, and 2-12 cm from ankle to below knee, first in herring bone and second spiral. Folded TG soft over top of bandage  07/16/2023 MLD to Rt LE  as follows: Short neck, superficial and deep abdominals, Rt axillary and  pectoral nodes, Rt inguino-axillary anastomosis, then Rt LE working from proximal to distal and retracing all steps,  then retracing all steps back to anastomosis.  Compression Bandaging: Cocoa butter applied to Rt lower leg and foot/toes, cut new TG soft size large from foot to knee, Molelast to toes 1-4, artiflex to foot/ankle, 1/2 gray foam ant and post lower leg fixated with artiflex, then short stretch compression bandages as follows: 1-6 cm to foot/ankle with 1/2 gray foam to dorsal foot/lateral malleolous and wagon wheel to medial  malleolous, then 1-8 cm ASH pattern, and 2-12 cm from ankle to below knee. Folded TG soft over top of bandage  07/14/2023 Removed bandages and washed leg with soap and water. MLD to Rt LE  as follows: Short neck, superficial and deep abdominals, Rt axillary and pectoral nodes, Rt inguino-axillary anastomosis, then Rt LE working from proximal to distal and retracing all steps,  then retracing all steps back to anastomosis.  Compression Bandaging: Cocoa butter applied to Rt lower leg and foot/toes, cut new TG soft size large from foot to knee, Molelast to toes 1-4, artiflex to foot/ankle, 1/2 gray foam ant and post lower leg fixated with artiflex, then short stretch compression bandages as follows: 1-6 cm to foot/ankle with 1/2 gray foam to dorsal foot/lateral malleolous and wagon wheel to medial malleolous, then 1-8 cm ASH pattern, and 2-12 cm from ankle to below knee. Folded TG soft over top of bandage  PATIENT EDUCATION:  Education details: education on lymphedema, anatomy and physiology of the lymphatic system, complete CDT and eventual needs for compression garments, educated about need for post op shoe Person educated: Patient and Child(ren) Education method: Explanation Education comprehension: verbalized understanding  HOME EXERCISE PROGRAM: Obtain post op shoe to begin bandaging  ASSESSMENT:  CLINICAL IMPRESSION: Continued with CDT to Rt LE as now we are just awaiting arrival of velcro garments. Then we can begin treatment to the Lt LE.   OBJECTIVE IMPAIRMENTS: decreased knowledge of condition, decreased knowledge of use of DME, difficulty walking, decreased ROM, increased edema, and pain.   ACTIVITY LIMITATIONS: bending, sitting, standing, squatting, sleeping, stairs, and transfers  PARTICIPATION LIMITATIONS: meal prep, cleaning, laundry, community activity, and yard work  PERSONAL FACTORS: Age, Fitness, Past/current experiences, and 1 comorbidity: kidney disease are also  affecting patient's functional outcome.   REHAB POTENTIAL: Good  CLINICAL DECISION MAKING: Evolving/moderate complexity  EVALUATION COMPLEXITY: Moderate   GOALS: Goals reviewed with patient? Yes  SHORT TERM GOALS: Target date: 07/06/23  Pt will demonstrate a 4 cm reduction in circumferences 30 cm superior to floor at lateral foot bilaterally to decrease risk of infection. Baseline: Goal status: INITIAL  2.  Pt will report a 25% improvement in pain in bilateral LEs to allow improved comfort.  Baseline:  Goal status: INITIAL    LONG TERM GOALS: Target date: 08/03/23  Pt will demonstrate maximal circumferential reductions as evidenced by no further significant reductions in circumferences to allow pt to be measured for compression garments.  Baseline:  Goal status: INITIAL  2.  Pt will obtain appropriate compression garments for day and night for long term management of lymphedema. Baseline:  Goal status: INITIAL  3.  Pt will benefit from a trial of a compression pump for long term management of lymphedema. Baseline:  Goal status: INITIAL  4.  Pt and/or daughter will be independent in self MLD for long term management of lymphedema. Baseline:  Goal status: INITIAL   PLAN:  PT FREQUENCY: 3x/week  PT DURATION:  8 weeks  PLANNED INTERVENTIONS: [id number]- PT Re-evaluation, 97110-Therapeutic exercises, 97530- Therapeutic activity, 97535- Self Care, [id number]- Manual therapy, [phone]- Orthotic Initial, [phone]- Orthotic/Prosthetic subsequent, Patient/Family education, Therapeutic exercises, Therapeutic activity, Neuromuscular re-education, and Self Care  PLAN FOR NEXT SESSION:  Cont complete CDT of Rt LE until velcro garment arrives. Cont to monitor urinary output d/t kidney disease. Once garment arrives begin bandaging Lt LE, should be able to use same leg foam but will need new dorsal foot foam cut for Lt side.   Aden Berwyn Caldron, PTA 07/23/2023, 12:14 PM

## 2023-07-26 ENCOUNTER — Encounter: Payer: Self-pay | Admitting: Rehabilitation

## 2023-07-26 ENCOUNTER — Ambulatory Visit: Admitting: Rehabilitation

## 2023-07-26 DIAGNOSIS — C9110 Chronic lymphocytic leukemia of B-cell type not having achieved remission: Secondary | ICD-10-CM

## 2023-07-26 DIAGNOSIS — I890 Lymphedema, not elsewhere classified: Secondary | ICD-10-CM | POA: Diagnosis not present

## 2023-07-26 DIAGNOSIS — R262 Difficulty in walking, not elsewhere classified: Secondary | ICD-10-CM

## 2023-07-26 NOTE — Therapy (Signed)
 OUTPATIENT PHYSICAL THERAPY  LOWER EXTREMITY ONCOLOGY TREATMENT  Patient Name: Michael [name] MRN: [id number] DOB:Jun 04, 1930, 89 y.o., male Today's Date: 07/26/2023  END OF SESSION:  PT End of Session - 07/26/23 1346     Visit Number 19    Number of Visits 25    Date for PT Re-Evaluation 08/03/23    PT Start Time 1300    PT Stop Time 1345    PT Time Calculation (min) 45 min    Activity Tolerance Patient tolerated treatment well    Behavior During Therapy [facility] for tasks assessed/performed             Past Medical History:  Diagnosis Date   Anemia    Anginal pain (HCC)    upon exertion   Arthritis    left shoulder, lower back (10/20/2016)   CLL (chronic lymphoblastic leukemia) dx'd 2000   Edema leg    Family history of adverse reaction to anesthesia    daughter's BP drops    GERD (gastroesophageal reflux disease)    History of hiatal hernia    Leukemia, chronic lymphoid (HCC) 12/08/2010   Lymphocytosis    Type II diabetes mellitus (HCC)    Past Surgical History:  Procedure Laterality Date   BOWEL RESECTION N/A 11/15/2017   Procedure: SMALL BOWEL RESECTION;  Surgeon: Vernetta Berg, MD;  Location: [facility] OR;  Service: General;  Laterality: N/A;   CATARACT EXTRACTION W/ INTRAOCULAR LENS  IMPLANT, BILATERAL Bilateral    EYE SURGERY     Cataracts (bilateral)   GROIN DISSECTION Left 11/15/2017   Procedure: GROIN EXPLORATION WITH REMOVAL OF INGUINAL HERNIA MESH;  Surgeon: Vernetta Berg, MD;  Location: [facility] OR;  Service: General;  Laterality: Left;   INGUINAL HERNIA REPAIR Left 11/10/2017   Procedure: LEFT INGUINAL HERNIA REPAIR ERAS PATHWAY;  Surgeon: Vernetta Berg, MD;  Location: [facility] OR;  Service: General;  Laterality: Left;   INGUINAL HERNIA REPAIR Left 11/15/2017   Procedure: LEFT INGUINAL HERNIA REPAIR WITH MESH;  Surgeon: Vernetta Berg, MD;  Location: [facility] OR;  Service: General;  Laterality: Left;   INSERTION OF MESH Left 11/10/2017   Procedure:  INSERTION OF MESH;  Surgeon: Vernetta Berg, MD;  Location: [facility] OR;  Service: General;  Laterality: Left;   Patient Active Problem List   Diagnosis Date Noted   Iron  deficiency anemia 02/20/2022   Acute on chronic diastolic CHF (congestive heart failure) (HCC) 02/19/2022   Maxillary fracture (HCC) 02/19/2022   Syncope and collapse 02/19/2022   Stage 3a chronic kidney disease (CKD) (HCC) 02/19/2022   Obesity (BMI 30-39.9) 02/19/2022   Superficial retinal hemorrhage of right eye 07/16/2021   Stable treated proliferative diabetic retinopathy of right eye determined by examination associated with type 2 diabetes mellitus (HCC) 10/23/2019   Proliferative diabetic retinopathy of left eye (HCC) 10/23/2019   Chronic kidney disease, stage III (moderate) (HCC) 09/12/2019   Type II diabetes mellitus (HCC)    Acute cystitis without hematuria    Malnutrition of moderate degree 12/21/2017   Hypotension 12/14/2017   Hyponatremia 12/14/2017   ARF (acute renal failure) (HCC) 12/14/2017   Pressure ulcer 11/22/2017   Ileus (HCC) 11/14/2017   S/P left inguinal hernia repair 11/10/2017   Weight loss, non-intentional 04/12/2017   Essential hypertension 10/20/2016   Paresthesias 10/19/2016   Left inguinal hernia 10/12/2016   Protein-calorie malnutrition, mild (HCC) 01/10/2016   Diarrhea due to drug 06/17/2015   Chronic left shoulder pain 06/17/2015   Bilateral leg edema 06/17/2015   Pancytopenia, acquired (HCC)  04/22/2015   Thrombocytopenia (HCC) 10/11/2014   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   CLL (chronic lymphocytic leukemia) (HCC) 12/08/2010    PCP: Lonell Collet, DO  REFERRING PROVIDER:  Lonn Hicks, MD  REFERRING DIAG: C91.10 (ICD-10-CM) - CLL (chronic lymphocytic leukemia) (HCC) R60.0 (ICD-10-CM) - Bilateral leg edema  THERAPY DIAG:  Lymphedema, not elsewhere classified  Difficulty in walking, not elsewhere classified  Leukemia, lymphocytic, chronic  (HCC)  ONSET DATE: 12/27/22  Rationale for Evaluation and Treatment: Rehabilitation  SUBJECTIVE:                                                                                                                                                                                           SUBJECTIVE STATEMENT:  I got my velcro!  PERTINENT HISTORY: CLL (chronic lymphocytic leukemia) (HCC) diagnosed on 04/03/2008. 04/29/2015 - 12/01/2021 Chemotherapy  04/29/2015: Ct scan showed mild lymphadenopathy and splenomegaly 01/08/2016: response to therapy.  06/11/23: Findings raise concern for possible recurrence of CLL - enlarged spleen and lymph nodes - starting new chemotherapy pill 06/27/23  PAIN:  Are you having pain? No, legs just feel sore now  PRECAUTIONS: None  RED FLAGS: None   WEIGHT BEARING RESTRICTIONS: No  FALLS:  Has patient fallen in last 6 months? 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  COGNITION: Overall cognitive status: Within functional limits for tasks assessed   PALPATION: Fibrosis present in bilateral LEs  OBSERVATIONS / OTHER ASSESSMENTS: papillomas and hyperkeratosis present    Media Information  Document Information EVAL    07/02/23  POSTURE: forward head, rounded shoulders   LYMPHEDEMA ASSESSMENTS:   LOWER EXTREMITY LANDMARK RIGHT eval 06/18/23 06/25/23 07/02/23 07/09/23 07/16/23 07/23/23  At groin         30 cm proximal to  suprapatella         20 cm proximal to suprapatella 59.5        10 cm proximal to suprapatella 56.1 56.8 55.4 56.5 55.5  54.2  At midpatella / popliteal crease 47 48 42.4 46 46 46 43.9  40 cm proximal to floor at lateral plantar  foot 55.8  46.6 45 45.3 45 41.5  30 cm proximal to floor at lateral plantar foot 53.8 49.3 47.3 45.3 43.5 42.7 37.8  20 cm proximal to floor at lateral plantar foot 46.9 44.9 42.4 40.0 38.4 39.7 35.2  10 cm proximal to floor at lateral plantar foot 38 34 31.4 31.2 31 32 28.4  Circumference of ankle/heel         5 cm proximal to 1st MTP joint 31.5 28.5 27.7 26.5 26.5 27.5 26.3  Across MTP joint 29.9 27.9 27.8 26.5 27 27  25.8  Around proximal great toe 12 11.0 11.3 10.2 10.2 10 10   (Blank rows = not tested)  LOWER EXTREMITY LANDMARK LEFT eval 07/26/23  At groin    30 cm proximal to suprapatella    20 cm proximal to suprapatella 57.5   10 cm proximal to suprapatella 55.9   At midpatella / popliteal crease 42.5   40 cm proximal to floor at lateral plantar foot 50.5 47  30 cm proximal to floor at lateral plantar foot 50 48.3  20 cm proximal to floor at lateral plantar foot 42 42.3  10 cm proximal to floor at lateral plantar foot 34.2 33.6  Circumference of ankle/heel    5 cm proximal to 1st MTP joint 30.4   Across MTP joint 30.1   Around proximal great toe 12.5   (Blank rows = not tested)  LLIS:  Flowsheet Row Outpatient Rehab from 06/08/2023 in [facility]  Lymphedema Life Impact Scale Total Score 72.06 %                                              TREATMENT DATE:  07/26/23 Manual Therapy Pt arrives with new velcro garment - educated pt on use and applied.  Pt has a sock donner at home he can try for the liner and he is able to reach the velcro at all spots.  It fit well - even has a little extra room at the top due to more reductions being made, but it could be trimmed if needed.  Education on velcro, washing, changing the  colors, etc.   Discussed Left leg and pt would like to bandage a few times and see what it does So applied bandage for the LEFT leg: Compression Bandaging: Cocoa butter applied to left lower leg and foot/toes, TG soft size large from foot to knee, Molelast to toes 1-3 (had trouble getting it around 4), artiflex to foot/ankle, 1/2 gray foam ant and post lower leg fixated with Idealbinde, then short stretch compression bandages as follows: 1-6 cm to foot/ankle with 1/2 gray foam to dorsal foot/lateral malleolous and wagon wheel to medial malleolous, then 1-8 cm ASH pattern working a bit up the lower leg and then 1-12 cm from ankle to below knee, Folded TG soft over top of bandage.  Education on removing 1 if needed if any SOB or adverse effects occur.   Pt demonstrating safety with gait.   07/23/23: Manual Therapy Weekly circumference measurements taken, excellent reductions since start of care! MLD to Rt LE  as follows: Short neck, superficial and deep abdominals, Rt axillary and pectoral nodes, Rt inguino-axillary anastomosis, then Rt LE working from proximal to distal and retracing all steps,  then retracing all steps back to anastomosis.  Compression Bandaging: Cocoa butter applied to Rt lower leg  and foot/toes, TG soft size large from foot to knee, Molelast to toes 1-4, artiflex to foot/ankle, 1/2 gray foam ant and post lower leg fixated with Idealbinde, then short stretch compression bandages as follows: 1-6 cm to foot/ankle with 1/2 gray foam to dorsal foot/lateral malleolous and wagon wheel to medial malleolous, then 1-8 cm ASH pattern, and 2-12 cm from ankle to below knee, first in herring bone and second spiral. Folded TG soft over top of bandage  07/19/2023 MLD to Rt LE  as follows: Short neck, superficial and deep abdominals, Rt axillary and pectoral nodes, Rt inguino-axillary anastomosis, then Rt LE working from proximal to distal and retracing all steps,  then retracing all steps back to  anastomosis.  Compression Bandaging: Cocoa butter applied to Rt lower leg and foot/toes, TG soft size large from foot to knee, Molelast to toes 1-4, artiflex to foot/ankle, 1/2 gray foam ant and post lower leg fixated with Idealbinde, then short stretch compression bandages as follows: 1-6 cm to foot/ankle with 1/2 gray foam to dorsal foot/lateral malleolous and wagon wheel to medial malleolous, then 1-8 cm ASH pattern, and 2-12 cm from ankle to below knee, first in herring bone and second spiral. Folded TG soft over top of bandage  PATIENT EDUCATION:  Education details: education on lymphedema, anatomy and physiology of the lymphatic system, complete CDT and eventual needs for compression garments, educated about need for post op shoe Person educated: Patient and Child(ren) Education method: Explanation Education comprehension: verbalized understanding  HOME EXERCISE PROGRAM: Obtain post op shoe to begin bandaging  ASSESSMENT:  CLINICAL IMPRESSION: Velcro applied to the Rt leg which fits well and started with CDT on the Left leg. Pt will most likely want to order velcro for that one soon.    OBJECTIVE IMPAIRMENTS: decreased knowledge of condition, decreased knowledge of use of DME, difficulty walking, decreased ROM, increased edema, and pain.   ACTIVITY LIMITATIONS: bending, sitting, standing, squatting, sleeping, stairs, and transfers  PARTICIPATION LIMITATIONS: meal prep, cleaning, laundry, community activity, and yard work  PERSONAL FACTORS: Age, Fitness, Past/current experiences, and 1 comorbidity: kidney disease are also affecting patient's functional outcome.   REHAB POTENTIAL: Good  CLINICAL DECISION MAKING: Evolving/moderate complexity  EVALUATION COMPLEXITY: Moderate   GOALS: Goals reviewed with patient? Yes  SHORT TERM GOALS: Target date: 07/06/23  Pt will demonstrate a 4 cm reduction in circumferences 30 cm superior to floor at lateral foot bilaterally to decrease  risk of infection. Baseline: Goal status: INITIAL  2.  Pt will report a 25% improvement in pain in bilateral LEs to allow improved comfort.  Baseline:  Goal status: INITIAL    LONG TERM GOALS: Target date: 08/03/23  Pt will demonstrate maximal circumferential reductions as evidenced by no further significant reductions in circumferences to allow pt to be measured for compression garments.  Baseline:  Goal status: INITIAL  2.  Pt will obtain appropriate compression garments for day and night for long term management of lymphedema. Baseline:  Goal status: INITIAL  3.  Pt will benefit from a trial of a compression pump for long term management of lymphedema. Baseline:  Goal status: INITIAL  4.  Pt and/or daughter will be independent in self MLD for long term management of lymphedema. Baseline:  Goal status: INITIAL   PLAN:  PT FREQUENCY: 3x/week  PT DURATION: 8 weeks  PLANNED INTERVENTIONS: [id number]- PT Re-evaluation, 97110-Therapeutic exercises, 97530- Therapeutic activity, 97535- Self Care, [id number]- Manual therapy, [id number]- Orthotic Initial, [id number]- Orthotic/Prosthetic subsequent, Patient/Family education, Therapeutic exercises,  Therapeutic activity, Neuromuscular re-education, and Self Care  PLAN FOR NEXT SESSION:  Cont complete CDT of Rt LE until velcro garment arrives. Cont to monitor urinary output d/t kidney disease. Once garment arrives begin bandaging Lt LE, should be able to use same leg foam but will need new dorsal foot foam cut for Lt side.   Larue Saddie SAUNDERS, PT 07/26/2023, 1:49 PM

## 2023-07-27 ENCOUNTER — Other Ambulatory Visit: Payer: Self-pay

## 2023-07-28 ENCOUNTER — Encounter: Payer: Self-pay | Admitting: Rehabilitation

## 2023-07-28 ENCOUNTER — Ambulatory Visit: Attending: Hematology and Oncology | Admitting: Rehabilitation

## 2023-07-28 DIAGNOSIS — C9110 Chronic lymphocytic leukemia of B-cell type not having achieved remission: Secondary | ICD-10-CM | POA: Insufficient documentation

## 2023-07-28 DIAGNOSIS — I890 Lymphedema, not elsewhere classified: Secondary | ICD-10-CM | POA: Insufficient documentation

## 2023-07-28 DIAGNOSIS — R262 Difficulty in walking, not elsewhere classified: Secondary | ICD-10-CM | POA: Diagnosis not present

## 2023-07-28 NOTE — Therapy (Signed)
 OUTPATIENT PHYSICAL THERAPY  LOWER EXTREMITY ONCOLOGY TREATMENT  Patient Name: Michael [name] MRN: [id number] DOB:Apr 13, 1930, 89 y.o., male Today's Date: 07/28/2023  END OF SESSION:  PT End of Session - 07/28/23 1351     Visit Number 20    Number of Visits 25    Date for PT Re-Evaluation 08/03/23    PT Start Time 1250    PT Stop Time 1330    PT Time Calculation (min) 40 min    Activity Tolerance Patient tolerated treatment well    Behavior During Therapy [facility] for tasks assessed/performed              Past Medical History:  Diagnosis Date   Anemia    Anginal pain (HCC)    upon exertion   Arthritis    left shoulder, lower back (10/20/2016)   CLL (chronic lymphoblastic leukemia) dx'd 2000   Edema leg    Family history of adverse reaction to anesthesia    daughter's BP drops    GERD (gastroesophageal reflux disease)    History of hiatal hernia    Leukemia, chronic lymphoid (HCC) 12/08/2010   Lymphocytosis    Type II diabetes mellitus (HCC)    Past Surgical History:  Procedure Laterality Date   BOWEL RESECTION N/A 11/15/2017   Procedure: SMALL BOWEL RESECTION;  Surgeon: Vernetta Berg, MD;  Location: [facility] OR;  Service: General;  Laterality: N/A;   CATARACT EXTRACTION W/ INTRAOCULAR LENS  IMPLANT, BILATERAL Bilateral    EYE SURGERY     Cataracts (bilateral)   GROIN DISSECTION Left 11/15/2017   Procedure: GROIN EXPLORATION WITH REMOVAL OF INGUINAL HERNIA MESH;  Surgeon: Vernetta Berg, MD;  Location: [facility] OR;  Service: General;  Laterality: Left;   INGUINAL HERNIA REPAIR Left 11/10/2017   Procedure: LEFT INGUINAL HERNIA REPAIR ERAS PATHWAY;  Surgeon: Vernetta Berg, MD;  Location: [facility] OR;  Service: General;  Laterality: Left;   INGUINAL HERNIA REPAIR Left 11/15/2017   Procedure: LEFT INGUINAL HERNIA REPAIR WITH MESH;  Surgeon: Vernetta Berg, MD;  Location: [facility] OR;  Service: General;  Laterality: Left;   INSERTION OF MESH Left 11/10/2017   Procedure:  INSERTION OF MESH;  Surgeon: Vernetta Berg, MD;  Location: [facility] OR;  Service: General;  Laterality: Left;   Patient Active Problem List   Diagnosis Date Noted   Iron  deficiency anemia 02/20/2022   Acute on chronic diastolic CHF (congestive heart failure) (HCC) 02/19/2022   Maxillary fracture (HCC) 02/19/2022   Syncope and collapse 02/19/2022   Stage 3a chronic kidney disease (CKD) (HCC) 02/19/2022   Obesity (BMI 30-39.9) 02/19/2022   Superficial retinal hemorrhage of right eye 07/16/2021   Stable treated proliferative diabetic retinopathy of right eye determined by examination associated with type 2 diabetes mellitus (HCC) 10/23/2019   Proliferative diabetic retinopathy of left eye (HCC) 10/23/2019   Chronic kidney disease, stage III (moderate) (HCC) 09/12/2019   Type II diabetes mellitus (HCC)    Acute cystitis without hematuria    Malnutrition of moderate degree 12/21/2017   Hypotension 12/14/2017   Hyponatremia 12/14/2017   ARF (acute renal failure) (HCC) 12/14/2017   Pressure ulcer 11/22/2017   Ileus (HCC) 11/14/2017   S/P left inguinal hernia repair 11/10/2017   Weight loss, non-intentional 04/12/2017   Essential hypertension 10/20/2016   Paresthesias 10/19/2016   Left inguinal hernia 10/12/2016   Protein-calorie malnutrition, mild (HCC) 01/10/2016   Diarrhea due to drug 06/17/2015   Chronic left shoulder pain 06/17/2015   Bilateral leg edema 06/17/2015   Pancytopenia, acquired (  HCC) 04/22/2015   Thrombocytopenia (HCC) 10/11/2014   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   CLL (chronic lymphocytic leukemia) (HCC) 12/08/2010    PCP: Lonell Collet, DO  REFERRING PROVIDER:  Lonn Hicks, MD  REFERRING DIAG: C91.10 (ICD-10-CM) - CLL (chronic lymphocytic leukemia) (HCC) R60.0 (ICD-10-CM) - Bilateral leg edema  THERAPY DIAG:  Lymphedema, not elsewhere classified  Difficulty in walking, not elsewhere classified  Leukemia, lymphocytic, chronic  (HCC)  ONSET DATE: 12/27/22  Rationale for Evaluation and Treatment: Rehabilitation  SUBJECTIVE:                                                                                                                                                                                           SUBJECTIVE STATEMENT:  I had the hardest time with my left leg wrapped.  Its like I couldn't move with it on.    PERTINENT HISTORY: CLL (chronic lymphocytic leukemia) (HCC) diagnosed on 04/03/2008. 04/29/2015 - 12/01/2021 Chemotherapy  04/29/2015: Ct scan showed mild lymphadenopathy and splenomegaly 01/08/2016: response to therapy.  06/11/23: Findings raise concern for possible recurrence of CLL - enlarged spleen and lymph nodes - starting new chemotherapy pill 06/27/23  PAIN:  Are you having pain? No, legs just feel sore now  PRECAUTIONS: None  RED FLAGS: None   WEIGHT BEARING RESTRICTIONS: No  FALLS:  Has patient fallen in last 6 months? 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  COGNITION: Overall cognitive status: Within functional limits for tasks assessed   PALPATION: Fibrosis present in bilateral LEs  OBSERVATIONS / OTHER ASSESSMENTS: papillomas and hyperkeratosis present    Media Information  Document Information EVAL    07/02/23  POSTURE: forward head, rounded shoulders   LYMPHEDEMA ASSESSMENTS:   LOWER EXTREMITY LANDMARK RIGHT eval 06/18/23 06/25/23  07/02/23 07/09/23 07/16/23 07/23/23  At groin         30 cm proximal to suprapatella         20 cm proximal to suprapatella 59.5        10 cm proximal to suprapatella 56.1 56.8 55.4 56.5 55.5  54.2  At midpatella /  popliteal crease 47 48 42.4 46 46 46 43.9  40 cm proximal to floor at lateral plantar foot 55.8  46.6 45 45.3 45 41.5  30 cm proximal to floor at lateral plantar foot 53.8 49.3 47.3 45.3 43.5 42.7 37.8  20 cm proximal to floor at lateral plantar foot 46.9 44.9 42.4 40.0 38.4 39.7 35.2  10 cm proximal to floor at lateral plantar foot 38 34 31.4 31.2 31 32 28.4  Circumference of ankle/heel         5 cm proximal to 1st MTP joint 31.5 28.5 27.7 26.5 26.5 27.5 26.3  Across MTP joint 29.9 27.9 27.8 26.5 27 27  25.8  Around proximal great toe 12 11.0 11.3 10.2 10.2 10 10   (Blank rows = not tested)  LOWER EXTREMITY LANDMARK LEFT eval 07/26/23 7/2//25  At groin     30 cm proximal to suprapatella     20 cm proximal to suprapatella 57.5    10 cm proximal to suprapatella 55.9    At midpatella / popliteal crease 42.5    40 cm proximal to floor at lateral plantar foot 50.5 47 45  30 cm proximal to floor at lateral plantar foot 50 48.3 47  20 cm proximal to floor at lateral plantar foot 42 42.3 41.5  10 cm proximal to floor at lateral plantar foot 34.2 33.6 33  Circumference of ankle/heel     5 cm proximal to 1st MTP joint 30.4    Across MTP joint 30.1    Around proximal great toe 12.5    (Blank rows = not tested)  LLIS:  Flowsheet Row Outpatient Rehab from 06/08/2023 in [facility]  Lymphedema Life Impact Scale Total Score 72.06 %                                              TREATMENT DATE:  07/28/23 Removed left leg wrap and remeasured leg while discussing difficulties with the wrap Discussed POC and decided to measure for velcro and not complete bandaging for the left leg Added order to abilico with note to [facility] to see if they could use the other  prescription and notes.  Pt fits in Large long leg wrap and large regular foot Juzo reversible wrap.   Applied cocoa butter bil Then donned velcro to the Rt leg and tg soft only to the left and regular shoes.    07/26/23 Manual Therapy Pt arrives with new velcro garment - educated pt on use and applied.  Pt has a sock donner at home he can try for the liner and he is able to reach the velcro at all spots.  It fit well - even has a little extra room at the top due to more reductions being made, but it could be trimmed if needed.  Education on velcro, washing, changing the colors, etc.   Discussed Left leg and pt would like to bandage a few times and see what it does So applied bandage for the LEFT leg: Compression Bandaging: Cocoa butter applied to left lower leg and foot/toes, TG soft size large from foot to knee, Molelast to toes 1-3 (had trouble getting it around 4), artiflex to foot/ankle, 1/2 gray foam ant and post lower leg fixated with Idealbinde, then short stretch compression bandages as follows: 1-6 cm to foot/ankle with 1/2 gray foam to dorsal  foot/lateral malleolous and wagon wheel to medial malleolous, then 1-8 cm ASH pattern working a bit up the lower leg and then 1-12 cm from ankle to below knee, Folded TG soft over top of bandage.  Education on removing 1 if needed if any SOB or adverse effects occur.   Pt demonstrating safety with gait.   07/23/23: Manual Therapy Weekly circumference measurements taken, excellent reductions since start of care! MLD to Rt LE  as follows: Short neck, superficial and deep abdominals, Rt axillary and pectoral nodes, Rt inguino-axillary anastomosis, then Rt LE working from proximal to distal and retracing all steps,  then retracing all steps back to anastomosis.  Compression Bandaging: Cocoa butter applied to Rt lower leg and foot/toes, TG soft size large from foot to knee, Molelast to toes 1-4, artiflex to foot/ankle, 1/2 gray foam ant and post lower  leg fixated with Idealbinde, then short stretch compression bandages as follows: 1-6 cm to foot/ankle with 1/2 gray foam to dorsal foot/lateral malleolous and wagon wheel to medial malleolous, then 1-8 cm ASH pattern, and 2-12 cm from ankle to below knee, first in herring bone and second spiral. Folded TG soft over top of bandage  07/19/2023 MLD to Rt LE  as follows: Short neck, superficial and deep abdominals, Rt axillary and pectoral nodes, Rt inguino-axillary anastomosis, then Rt LE working from proximal to distal and retracing all steps,  then retracing all steps back to anastomosis.  Compression Bandaging: Cocoa butter applied to Rt lower leg and foot/toes, TG soft size large from foot to knee, Molelast to toes 1-4, artiflex to foot/ankle, 1/2 gray foam ant and post lower leg fixated with Idealbinde, then short stretch compression bandages as follows: 1-6 cm to foot/ankle with 1/2 gray foam to dorsal foot/lateral malleolous and wagon wheel to medial malleolous, then 1-8 cm ASH pattern, and 2-12 cm from ankle to below knee, first in herring bone and second spiral. Folded TG soft over top of bandage  PATIENT EDUCATION:  Education details: education on lymphedema, anatomy and physiology of the lymphatic system, complete CDT and eventual needs for compression garments, educated about need for post op shoe Person educated: Patient and Child(ren) Education method: Explanation Education comprehension: verbalized understanding  HOME EXERCISE PROGRAM: Obtain post op shoe to begin bandaging  ASSESSMENT:  CLINICAL IMPRESSION: Pt is not feeling well (cold symtpoms) and had a hard time with mobility with the left leg bandaged so we will not bandage the left and order a velcro with pt using Tg soft for that leg for now.  Had some reduction since application on Monday.  Pt felt good upon leaving.  We will recheck in 1 week to see how the Rt leg is doing with the velcro.     OBJECTIVE IMPAIRMENTS:  decreased knowledge of condition, decreased knowledge of use of DME, difficulty walking, decreased ROM, increased edema, and pain.   ACTIVITY LIMITATIONS: bending, sitting, standing, squatting, sleeping, stairs, and transfers  PARTICIPATION LIMITATIONS: meal prep, cleaning, laundry, community activity, and yard work  PERSONAL FACTORS: Age, Fitness, Past/current experiences, and 1 comorbidity: kidney disease are also affecting patient's functional outcome.   REHAB POTENTIAL: Good  CLINICAL DECISION MAKING: Evolving/moderate complexity  EVALUATION COMPLEXITY: Moderate   GOALS: Goals reviewed with patient? Yes  SHORT TERM GOALS: Target date: 07/06/23  Pt will demonstrate a 4 cm reduction in circumferences 30 cm superior to floor at lateral foot bilaterally to decrease risk of infection. Baseline: Goal status: MET on Rt leg   2.  Pt will report a 25% improvement in pain in bilateral LEs to allow improved comfort.  Baseline:  Goal status: MET    LONG TERM GOALS: Target date: 08/03/23  Pt will demonstrate maximal circumferential reductions as evidenced by no further significant reductions in circumferences to allow pt to be measured for compression garments.  Baseline:  Goal status: MET  2.  Pt will obtain appropriate compression garments for day and night for long term management of lymphedema. Baseline:  Goal status: ALMOST MET  3.  Pt will benefit from a trial of a compression pump for long term management of lymphedema. Baseline:  Goal status: INITIAL  4.  Pt and/or daughter will be independent in self MLD for long term management of lymphedema. Baseline:  Goal status: INITIAL   PLAN:  PT FREQUENCY: 3x/week  PT DURATION: 8 weeks  PLANNED INTERVENTIONS: [id number]- PT Re-evaluation, 97110-Therapeutic exercises, 97530- Therapeutic activity, 97535- Self Care, [id number]- Manual therapy, 97760- Orthotic Initial, [phone]- Orthotic/Prosthetic subsequent, Patient/Family education,  Therapeutic exercises, Therapeutic activity, Neuromuscular re-education, and Self Care  PLAN FOR NEXT SESSION:  recheck leg status, would pt like to try pump? Or teach proximal MLD   Larue Saddie SAUNDERS, PT 07/28/2023, 1:52 PM

## 2023-08-02 ENCOUNTER — Ambulatory Visit: Admitting: Rehabilitation

## 2023-08-04 ENCOUNTER — Encounter: Payer: Self-pay | Admitting: Rehabilitation

## 2023-08-04 ENCOUNTER — Ambulatory Visit: Admitting: Rehabilitation

## 2023-08-04 DIAGNOSIS — R262 Difficulty in walking, not elsewhere classified: Secondary | ICD-10-CM | POA: Diagnosis not present

## 2023-08-04 DIAGNOSIS — I890 Lymphedema, not elsewhere classified: Secondary | ICD-10-CM | POA: Diagnosis not present

## 2023-08-04 DIAGNOSIS — C9110 Chronic lymphocytic leukemia of B-cell type not having achieved remission: Secondary | ICD-10-CM

## 2023-08-04 NOTE — Therapy (Signed)
 OUTPATIENT PHYSICAL THERAPY  LOWER EXTREMITY ONCOLOGY TREATMENT  Patient Name: Michael [name] MRN: [id number] DOB:1930/09/08, 89 y.o., male Today's Date: 08/04/2023  END OF SESSION:  PT End of Session - 08/04/23 1348     Visit Number 21    Number of Visits 25    PT Start Time 1200    PT Stop Time 1240    PT Time Calculation (min) 40 min    Activity Tolerance Patient limited by lethargy;Patient limited by fatigue    Behavior During Therapy [facility] for tasks assessed/performed               Past Medical History:  Diagnosis Date   Anemia    Anginal pain (HCC)    upon exertion   Arthritis    left shoulder, lower back (10/20/2016)   CLL (chronic lymphoblastic leukemia) dx'd 2000   Edema leg    Family history of adverse reaction to anesthesia    daughter's BP drops    GERD (gastroesophageal reflux disease)    History of hiatal hernia    Leukemia, chronic lymphoid (HCC) 12/08/2010   Lymphocytosis    Type II diabetes mellitus (HCC)    Past Surgical History:  Procedure Laterality Date   BOWEL RESECTION N/A 11/15/2017   Procedure: SMALL BOWEL RESECTION;  Surgeon: Vernetta Berg, MD;  Location: [facility] OR;  Service: General;  Laterality: N/A;   CATARACT EXTRACTION W/ INTRAOCULAR LENS  IMPLANT, BILATERAL Bilateral    EYE SURGERY     Cataracts (bilateral)   GROIN DISSECTION Left 11/15/2017   Procedure: GROIN EXPLORATION WITH REMOVAL OF INGUINAL HERNIA MESH;  Surgeon: Vernetta Berg, MD;  Location: [facility] OR;  Service: General;  Laterality: Left;   INGUINAL HERNIA REPAIR Left 11/10/2017   Procedure: LEFT INGUINAL HERNIA REPAIR ERAS PATHWAY;  Surgeon: Vernetta Berg, MD;  Location: [facility] OR;  Service: General;  Laterality: Left;   INGUINAL HERNIA REPAIR Left 11/15/2017   Procedure: LEFT INGUINAL HERNIA REPAIR WITH MESH;  Surgeon: Vernetta Berg, MD;  Location: [facility] OR;  Service: General;  Laterality: Left;   INSERTION OF MESH Left 11/10/2017   Procedure: INSERTION OF MESH;   Surgeon: Vernetta Berg, MD;  Location: [facility] OR;  Service: General;  Laterality: Left;   Patient Active Problem List   Diagnosis Date Noted   Iron  deficiency anemia 02/20/2022   Acute on chronic diastolic CHF (congestive heart failure) (HCC) 02/19/2022   Maxillary fracture (HCC) 02/19/2022   Syncope and collapse 02/19/2022   Stage 3a chronic kidney disease (CKD) (HCC) 02/19/2022   Obesity (BMI 30-39.9) 02/19/2022   Superficial retinal hemorrhage of right eye 07/16/2021   Stable treated proliferative diabetic retinopathy of right eye determined by examination associated with type 2 diabetes mellitus (HCC) 10/23/2019   Proliferative diabetic retinopathy of left eye (HCC) 10/23/2019   Chronic kidney disease, stage III (moderate) (HCC) 09/12/2019   Type II diabetes mellitus (HCC)    Acute cystitis without hematuria    Malnutrition of moderate degree 12/21/2017   Hypotension 12/14/2017   Hyponatremia 12/14/2017   ARF (acute renal failure) (HCC) 12/14/2017   Pressure ulcer 11/22/2017   Ileus (HCC) 11/14/2017   S/P left inguinal hernia repair 11/10/2017   Weight loss, non-intentional 04/12/2017   Essential hypertension 10/20/2016   Paresthesias 10/19/2016   Left inguinal hernia 10/12/2016   Protein-calorie malnutrition, mild (HCC) 01/10/2016   Diarrhea due to drug 06/17/2015   Chronic left shoulder pain 06/17/2015   Bilateral leg edema 06/17/2015   Pancytopenia, acquired (HCC) 04/22/2015  Thrombocytopenia (HCC) 10/11/2014   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   CLL (chronic lymphocytic leukemia) (HCC) 12/08/2010    PCP: Lonell Collet, DO  REFERRING PROVIDER:  Lonn Hicks, MD  REFERRING DIAG: C91.10 (ICD-10-CM) - CLL (chronic lymphocytic leukemia) (HCC) R60.0 (ICD-10-CM) - Bilateral leg edema  THERAPY DIAG:  Lymphedema, not elsewhere classified  Difficulty in walking, not elsewhere classified  Leukemia, lymphocytic, chronic (HCC)  ONSET DATE:  12/27/22  Rationale for Evaluation and Treatment: Rehabilitation  SUBJECTIVE:                                                                                                                                                                                           SUBJECTIVE STATEMENT:  I am just really not feeling well.  Dr. Lonn thinks it is a virus  PERTINENT HISTORY: CLL (chronic lymphocytic leukemia) (HCC) diagnosed on 04/03/2008. 04/29/2015 - 12/01/2021 Chemotherapy  04/29/2015: Ct scan showed mild lymphadenopathy and splenomegaly 01/08/2016: response to therapy.  06/11/23: Findings raise concern for possible recurrence of CLL - enlarged spleen and lymph nodes - starting new chemotherapy pill 06/27/23  PAIN:  Are you having pain? No, legs just feel sore now  PRECAUTIONS: None  RED FLAGS: None   WEIGHT BEARING RESTRICTIONS: No  FALLS:  Has patient fallen in last 6 months? 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  COGNITION: Overall cognitive status: Within functional limits for tasks assessed   PALPATION: Fibrosis present in bilateral LEs  OBSERVATIONS / OTHER ASSESSMENTS: papillomas and hyperkeratosis present    Media Information  Document Information EVAL    07/02/23  POSTURE: forward head, rounded shoulders   LYMPHEDEMA ASSESSMENTS:   LOWER EXTREMITY LANDMARK RIGHT eval 06/18/23 06/25/23 07/02/23 07/09/23 07/16/23 07/23/23 08/04/23  At  groin          30 cm proximal to suprapatella          20 cm proximal to suprapatella 59.5         10 cm proximal to suprapatella 56.1 56.8 55.4 56.5 55.5  54.2   At midpatella / popliteal crease 47 48 42.4  46 46 46 43.9 45  40 cm proximal to floor at lateral plantar foot 55.8  46.6 45 45.3 45 41.5 49.6  30 cm proximal to floor at lateral plantar foot 53.8 49.3 47.3 45.3 43.5 42.7 37.8 50.3  20 cm proximal to floor at lateral plantar foot 46.9 44.9 42.4 40.0 38.4 39.7 35.2 44.4  10 cm proximal to floor at lateral plantar foot 38 34 31.4 31.2 31 32 28.4 33.5  Circumference of ankle/heel          5 cm proximal to 1st MTP joint 31.5 28.5 27.7 26.5 26.5 27.5 26.3 29.2  Across MTP joint 29.9 27.9 27.8 26.5 27 27  25.8 28.6  Around proximal great toe 12 11.0 11.3 10.2 10.2 10 10  10.5  (Blank rows = not tested)  LOWER EXTREMITY LANDMARK LEFT eval 07/26/23 7/2//25  At groin     30 cm proximal to suprapatella     20 cm proximal to suprapatella 57.5    10 cm proximal to suprapatella 55.9    At midpatella / popliteal crease 42.5    40 cm proximal to floor at lateral plantar foot 50.5 47 45  30 cm proximal to floor at lateral plantar foot 50 48.3 47  20 cm proximal to floor at lateral plantar foot 42 42.3 41.5  10 cm proximal to floor at lateral plantar foot 34.2 33.6 33  Circumference of ankle/heel     5 cm proximal to 1st MTP joint 30.4    Across MTP joint 30.1    Around proximal great toe 12.5    (Blank rows = not tested)  LLIS:  Flowsheet Row Outpatient Rehab from 06/08/2023 in [facility]  Lymphedema Life Impact Scale Total Score 72.06 %                                              TREATMENT DATE:  08/04/23 Pt not moving well today. Only able to walk to room 9 vs 10 today and PTA had to stop and help him.  We also used a W/C for exit.   He is not feeling well and was told it is a virus and will see Dr. Lonn tomorrow for labs.   Remeasured the Rt  LE Applied cocoa butter with seated MLD to the lower leg only Then applied velcro  07/28/23 Removed left leg wrap and remeasured leg while discussing difficulties with the wrap Discussed POC and decided to measure for velcro and not complete bandaging for the left leg Added order to abilico with note to [facility] to see if they could use the other prescription and notes.  Pt fits in Large long leg wrap and large regular foot Juzo reversible wrap.   Applied cocoa butter bil Then donned velcro to the Rt leg and tg soft only to the left and regular shoes.    07/26/23 Manual Therapy Pt arrives with new velcro garment - educated pt on use and applied.  Pt has a sock donner at home he can try for the liner and he is able to reach the velcro at all spots.  It fit well - even has a little extra room at the top due to more reductions being made, but it could be trimmed if needed.  Education on velcro, washing, changing the colors, etc.   Discussed Left leg and pt  would like to bandage a few times and see what it does So applied bandage for the LEFT leg: Compression Bandaging: Cocoa butter applied to left lower leg and foot/toes, TG soft size large from foot to knee, Molelast to toes 1-3 (had trouble getting it around 4), artiflex to foot/ankle, 1/2 gray foam ant and post lower leg fixated with Idealbinde, then short stretch compression bandages as follows: 1-6 cm to foot/ankle with 1/2 gray foam to dorsal foot/lateral malleolous and wagon wheel to medial malleolous, then 1-8 cm ASH pattern working a bit up the lower leg and then 1-12 cm from ankle to below knee, Folded TG soft over top of bandage.  Education on removing 1 if needed if any SOB or adverse effects occur.   Pt demonstrating safety with gait.   07/23/23: Manual Therapy Weekly circumference measurements taken, excellent reductions since start of care! MLD to Rt LE  as follows: Short neck, superficial and deep abdominals, Rt axillary and  pectoral nodes, Rt inguino-axillary anastomosis, then Rt LE working from proximal to distal and retracing all steps,  then retracing all steps back to anastomosis.  Compression Bandaging: Cocoa butter applied to Rt lower leg and foot/toes, TG soft size large from foot to knee, Molelast to toes 1-4, artiflex to foot/ankle, 1/2 gray foam ant and post lower leg fixated with Idealbinde, then short stretch compression bandages as follows: 1-6 cm to foot/ankle with 1/2 gray foam to dorsal foot/lateral malleolous and wagon wheel to medial malleolous, then 1-8 cm ASH pattern, and 2-12 cm from ankle to below knee, first in herring bone and second spiral. Folded TG soft over top of bandage  07/19/2023 MLD to Rt LE  as follows: Short neck, superficial and deep abdominals, Rt axillary and pectoral nodes, Rt inguino-axillary anastomosis, then Rt LE working from proximal to distal and retracing all steps,  then retracing all steps back to anastomosis.  Compression Bandaging: Cocoa butter applied to Rt lower leg and foot/toes, TG soft size large from foot to knee, Molelast to toes 1-4, artiflex to foot/ankle, 1/2 gray foam ant and post lower leg fixated with Idealbinde, then short stretch compression bandages as follows: 1-6 cm to foot/ankle with 1/2 gray foam to dorsal foot/lateral malleolous and wagon wheel to medial malleolous, then 1-8 cm ASH pattern, and 2-12 cm from ankle to below knee, first in herring bone and second spiral. Folded TG soft over top of bandage  PATIENT EDUCATION:  Education details: education on lymphedema, anatomy and physiology of the lymphatic system, complete CDT and eventual needs for compression garments, educated about need for post op shoe Person educated: Patient and Child(ren) Education method: Explanation Education comprehension: verbalized understanding  HOME EXERCISE PROGRAM: Obtain post op shoe to begin bandaging  ASSESSMENT:  CLINICAL IMPRESSION: Pt is still not feeling  well (cold symtpoms) and had a hard time with mobility and fatigue.  He will get his labs rechecked tomorrow.  Had some increase in size over the past week with velcro use but they have been able to apply the velcro.  Decided to have pt let us  know if they need to recheck anything.  His other velcro will be ordered soon per abilico status.      OBJECTIVE IMPAIRMENTS: decreased knowledge of condition, decreased knowledge of use of DME, difficulty walking, decreased ROM, increased edema, and pain.   ACTIVITY LIMITATIONS: bending, sitting, standing, squatting, sleeping, stairs, and transfers  PARTICIPATION LIMITATIONS: meal prep, cleaning, laundry, community activity, and yard work  PERSONAL FACTORS:  Age, Fitness, Past/current experiences, and 1 comorbidity: kidney disease are also affecting patient's functional outcome.   REHAB POTENTIAL: Good  CLINICAL DECISION MAKING: Evolving/moderate complexity  EVALUATION COMPLEXITY: Moderate   GOALS: Goals reviewed with patient? Yes  SHORT TERM GOALS: Target date: 07/06/23  Pt will demonstrate a 4 cm reduction in circumferences 30 cm superior to floor at lateral foot bilaterally to decrease risk of infection. Baseline: Goal status: MET on Rt leg   2.  Pt will report a 25% improvement in pain in bilateral LEs to allow improved comfort.  Baseline:  Goal status: MET    LONG TERM GOALS: Target date: 08/03/23  Pt will demonstrate maximal circumferential reductions as evidenced by no further significant reductions in circumferences to allow pt to be measured for compression garments.  Baseline:  Goal status: MET  2.  Pt will obtain appropriate compression garments for day and night for long term management of lymphedema. Baseline:  Goal status: MET  3.  Pt will benefit from a trial of a compression pump for long term management of lymphedema. Baseline:  Goal status: Deferred   4.  Pt and/or daughter will be independent in self MLD for long  term management of lymphedema. Baseline:  Goal status: Deferred    PLAN:  PT FREQUENCY: 3x/week  PT DURATION: 8 weeks  PLANNED INTERVENTIONS: [id number]- PT Re-evaluation, 97110-Therapeutic exercises, 97530- Therapeutic activity, 97535- Self Care, [id number]- Manual therapy, 97760- Orthotic Initial, [id number]- Orthotic/Prosthetic subsequent, Patient/Family education, Therapeutic exercises, Therapeutic activity, Neuromuscular re-education, and Self Care  PLAN FOR NEXT SESSION:  recheck as needed   Larue Saddie SAUNDERS, PT 08/04/2023, 1:48 PM

## 2023-08-05 ENCOUNTER — Other Ambulatory Visit (HOSPITAL_COMMUNITY): Payer: Self-pay

## 2023-08-05 ENCOUNTER — Inpatient Hospital Stay (HOSPITAL_BASED_OUTPATIENT_CLINIC_OR_DEPARTMENT_OTHER): Admitting: Hematology and Oncology

## 2023-08-05 ENCOUNTER — Inpatient Hospital Stay: Attending: Hematology and Oncology

## 2023-08-05 ENCOUNTER — Encounter: Payer: Self-pay | Admitting: Hematology and Oncology

## 2023-08-05 ENCOUNTER — Other Ambulatory Visit: Payer: Self-pay

## 2023-08-05 ENCOUNTER — Other Ambulatory Visit: Payer: Self-pay | Admitting: *Deleted

## 2023-08-05 VITALS — BP 138/78 | HR 82 | Temp 97.7°F | Resp 18 | Ht 67.0 in | Wt 207.8 lb

## 2023-08-05 DIAGNOSIS — C9110 Chronic lymphocytic leukemia of B-cell type not having achieved remission: Secondary | ICD-10-CM

## 2023-08-05 DIAGNOSIS — N183 Chronic kidney disease, stage 3 unspecified: Secondary | ICD-10-CM | POA: Diagnosis not present

## 2023-08-05 DIAGNOSIS — R310 Gross hematuria: Secondary | ICD-10-CM | POA: Diagnosis not present

## 2023-08-05 DIAGNOSIS — T451X5A Adverse effect of antineoplastic and immunosuppressive drugs, initial encounter: Secondary | ICD-10-CM | POA: Diagnosis not present

## 2023-08-05 DIAGNOSIS — E119 Type 2 diabetes mellitus without complications: Secondary | ICD-10-CM

## 2023-08-05 DIAGNOSIS — E1122 Type 2 diabetes mellitus with diabetic chronic kidney disease: Secondary | ICD-10-CM | POA: Insufficient documentation

## 2023-08-05 DIAGNOSIS — D6481 Anemia due to antineoplastic chemotherapy: Secondary | ICD-10-CM | POA: Diagnosis not present

## 2023-08-05 DIAGNOSIS — R319 Hematuria, unspecified: Secondary | ICD-10-CM | POA: Insufficient documentation

## 2023-08-05 LAB — COMPREHENSIVE METABOLIC PANEL WITH GFR
ALT: 28 U/L (ref 0–44)
AST: 28 U/L (ref 15–41)
Albumin: 3.1 g/dL — ABNORMAL LOW (ref 3.5–5.0)
Alkaline Phosphatase: 60 U/L (ref 38–126)
Anion gap: 9 (ref 5–15)
BUN: 57 mg/dL — ABNORMAL HIGH (ref 8–23)
CO2: 26 mmol/L (ref 22–32)
Calcium: 8.5 mg/dL — ABNORMAL LOW (ref 8.9–10.3)
Chloride: 102 mmol/L (ref 98–111)
Creatinine, Ser: 1.87 mg/dL — ABNORMAL HIGH (ref 0.61–1.24)
GFR, Estimated: 34 mL/min — ABNORMAL LOW (ref >60)
Glucose, Bld: 203 mg/dL — ABNORMAL HIGH (ref 70–99)
Potassium: 4.2 mmol/L (ref 3.5–5.1)
Sodium: 137 mmol/L (ref 135–145)
Total Bilirubin: 0.7 mg/dL (ref 0.0–1.2)
Total Protein: 6.8 g/dL (ref 6.5–8.1)

## 2023-08-05 LAB — CBC WITH DIFFERENTIAL/PLATELET
Abs Immature Granulocytes: 0.01 K/uL (ref 0.00–0.07)
Basophils Absolute: 0 K/uL (ref 0.0–0.1)
Basophils Relative: 0 %
Eosinophils Absolute: 0 K/uL (ref 0.0–0.5)
Eosinophils Relative: 0 %
HCT: 24.3 % — ABNORMAL LOW (ref 39.0–52.0)
Hemoglobin: 8.2 g/dL — ABNORMAL LOW (ref 13.0–17.0)
Immature Granulocytes: 0 %
Lymphocytes Relative: 57 %
Lymphs Abs: 2.6 K/uL (ref 0.7–4.0)
MCH: 31.2 pg (ref 26.0–34.0)
MCHC: 33.7 g/dL (ref 30.0–36.0)
MCV: 92.4 fL (ref 80.0–100.0)
Monocytes Absolute: 0.1 K/uL (ref 0.1–1.0)
Monocytes Relative: 2 %
Neutro Abs: 1.8 K/uL (ref 1.7–7.7)
Neutrophils Relative %: 41 %
Platelets: 79 K/uL — ABNORMAL LOW (ref 150–400)
RBC: 2.63 MIL/uL — ABNORMAL LOW (ref 4.22–5.81)
RDW: 14.6 % (ref 11.5–15.5)
Smear Review: NORMAL
WBC: 4.5 K/uL (ref 4.0–10.5)
nRBC: 0 % (ref 0.0–0.2)

## 2023-08-05 LAB — URINALYSIS, COMPLETE (UACMP) WITH MICROSCOPIC
Bacteria, UA: NONE SEEN
Bilirubin Urine: NEGATIVE
Glucose, UA: NEGATIVE mg/dL
Hgb urine dipstick: NEGATIVE
Ketones, ur: NEGATIVE mg/dL
Leukocytes,Ua: NEGATIVE
Nitrite: NEGATIVE
Protein, ur: NEGATIVE mg/dL
Specific Gravity, Urine: 1.015 (ref 1.005–1.030)
pH: 5 (ref 5.0–8.0)

## 2023-08-05 MED ORDER — PREDNISONE 20 MG PO TABS
40.0000 mg | ORAL_TABLET | Freq: Every day | ORAL | 0 refills | Status: DC
  Filled 2023-08-05 (×2): qty 60, 30d supply, fill #0

## 2023-08-05 NOTE — Assessment & Plan Note (Addendum)
 He is aware of risk of hyperglycemia on prednisone  I will call him next week and if he is better, we will reduce the prednisone 

## 2023-08-05 NOTE — Assessment & Plan Note (Addendum)
 He has slight worsening renal failure due to poor oral intake We discussed importance of adequate hydration

## 2023-08-05 NOTE — Assessment & Plan Note (Addendum)
 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

## 2023-08-05 NOTE — Progress Notes (Signed)
 [facility] OFFICE PROGRESS NOTE  Patient Care Team: [name], DO as PCP - General (Family Medicine) Lonn Hicks, MD as Consulting Physician (Hematology and Oncology)  Assessment & Plan Gross hematuria He complained of mild hematuria Will order urinalysis and urine culture CLL (chronic lymphocytic leukemia) (HCC) The patient was diagnosed with CLL in 2010, Rai stage II with lymphadenopathy and splenomegaly, FISH analysis showed trisomy 12 He received ibrutinib  between April 2017 to November 2023  CT imaging from May showed progressive splenomegaly and lymphadenopathy Calquence  was started on June 1 Treatment was recently interrupted due to progressive thrombocytopenia He resumed treatment again on July 1 and came back with anorexia, poor oral intake, slight acute renal failure and worsening anemia Will put his treatment on hold I recommend short course of prednisone  I will see him again in 2 weeks for further follow-up Stage 3 chronic kidney disease, unspecified whether stage 3a or 3b CKD (HCC) He has slight worsening renal failure due to poor oral intake We discussed importance of adequate hydration Anemia due to antineoplastic chemotherapy Will place treatment on hold due to worsening anemia Type 2 diabetes mellitus without complication, without long-term current use of insulin  (HCC) He is aware of risk of hyperglycemia on prednisone  I will call him next week and if he is better, we will reduce the prednisone   Orders Placed This Encounter  Procedures   Urine Culture    Standing Status:   Future    Number of Occurrences:   1    Expected Date:   08/05/2023    Expiration Date:   08/04/2024   Urinalysis, Complete w Microscopic    Standing Status:   Future    Number of Occurrences:   1    Expected Date:   08/05/2023    Expiration Date:   08/04/2024     Hicks Lonn, MD  INTERVAL HISTORY: he returns for treatment follow-up Complications related to previous  cycle of chemotherapy included pancytopenia,, elevated serum creatinine, and poor appetite and hematuria He denies pain on urination No fever or chills Since he resumed chemotherapy, he has very poor appetite He has no recent weight loss  PHYSICAL EXAMINATION: ECOG PERFORMANCE STATUS: 2 - Symptomatic, <50% confined to bed  No results found for: CAN125    Latest Ref Rng & Units 08/05/2023   10:42 AM 07/20/2023   12:01 PM 07/13/2023   11:29 AM  CBC  WBC 4.0 - 10.5 K/uL 4.5  6.0  6.3   Hemoglobin 13.0 - 17.0 g/dL 8.2  89.4  9.9   Hematocrit 39.0 - 52.0 % 24.3  30.2  29.3   Platelets 150 - 400 K/uL 79  62  59       Chemistry      Component Value Date/Time   NA 137 08/05/2023 1042   NA 140 10/09/2016 1106   K 4.2 08/05/2023 1042   K 4.7 10/09/2016 1106   CL 102 08/05/2023 1042   CL 104 03/28/2012 1426   CO2 26 08/05/2023 1042   CO2 28 10/09/2016 1106   BUN 57 (H) 08/05/2023 1042   BUN 20.8 10/09/2016 1106   CREATININE 1.87 (H) 08/05/2023 1042   CREATININE 1.30 (H) 06/02/2022 1123   CREATININE 1.1 10/09/2016 1106      Component Value Date/Time   CALCIUM  8.5 (L) 08/05/2023 1042   CALCIUM  9.1 10/09/2016 1106   ALKPHOS 60 08/05/2023 1042   ALKPHOS 46 10/09/2016 1106   AST 28 08/05/2023 1042   AST  16 06/02/2022 1123   AST 17 10/09/2016 1106   ALT 28 08/05/2023 1042   ALT 8 06/02/2022 1123   ALT 8 10/09/2016 1106   BILITOT 0.7 08/05/2023 1042   BILITOT 0.5 06/02/2022 1123   BILITOT 0.23 10/09/2016 1106       Vitals:   08/05/23 1118  BP: 138/78  Pulse: 82  Resp: 18  Temp: 97.7 F (36.5 C)  SpO2: 100%   Filed Weights   08/05/23 1118  Weight: 207 lb 12.8 oz (94.3 kg)   Other relevant data reviewed during this visit included CBC, CMP, urinalysis

## 2023-08-05 NOTE — Assessment & Plan Note (Addendum)
 Will place treatment on hold due to worsening anemia

## 2023-08-05 NOTE — Assessment & Plan Note (Addendum)
 He complained of mild hematuria Will order urinalysis and urine culture

## 2023-08-06 ENCOUNTER — Ambulatory Visit: Payer: Self-pay | Admitting: Hematology and Oncology

## 2023-08-06 LAB — URINE CULTURE: Culture: <10000 — AB

## 2023-08-06 NOTE — Telephone Encounter (Signed)
-----   Message from Almarie Bedford sent at 08/06/2023 12:38 PM EDT ----- Pls let him know urine culture is neg for inf ----- Message ----- From: Interface, Lab In [place] Sent: 08/05/2023  12:00 PM EDT To: Almarie Bedford, MD

## 2023-08-06 NOTE — Telephone Encounter (Signed)
 Notified patient of negative urine culture results. Patient verbalized an understanding of the information.  Confirmed upcoming appointments for 7/24.

## 2023-08-09 ENCOUNTER — Other Ambulatory Visit (HOSPITAL_COMMUNITY): Payer: Self-pay

## 2023-08-09 ENCOUNTER — Encounter: Admitting: Rehabilitation

## 2023-08-11 ENCOUNTER — Encounter: Admitting: Rehabilitation

## 2023-08-13 ENCOUNTER — Encounter: Admitting: Rehabilitation

## 2023-08-16 ENCOUNTER — Telehealth: Payer: Self-pay | Admitting: *Deleted

## 2023-08-16 NOTE — Telephone Encounter (Signed)
 Patient returned call.  Communicated instructions.  No further questions.

## 2023-08-16 NOTE — Telephone Encounter (Signed)
 Lonn Hicks, MD  Claudene Suzen SAILOR, RN Hi,  He was placed on prednisone  I recommend reducing it to 20 mg until I see him back this week   Attempted to reach the patient to advise of medication changes.  Left message pending call back.

## 2023-08-18 ENCOUNTER — Other Ambulatory Visit: Payer: Self-pay

## 2023-08-19 ENCOUNTER — Inpatient Hospital Stay: Admitting: Hematology and Oncology

## 2023-08-19 ENCOUNTER — Inpatient Hospital Stay

## 2023-08-20 ENCOUNTER — Other Ambulatory Visit: Payer: Self-pay

## 2023-08-27 ENCOUNTER — Inpatient Hospital Stay: Admitting: Hematology and Oncology

## 2023-08-27 ENCOUNTER — Inpatient Hospital Stay: Attending: Hematology and Oncology

## 2023-08-27 ENCOUNTER — Other Ambulatory Visit (HOSPITAL_COMMUNITY): Payer: Self-pay

## 2023-08-27 ENCOUNTER — Other Ambulatory Visit (HOSPITAL_BASED_OUTPATIENT_CLINIC_OR_DEPARTMENT_OTHER): Payer: Self-pay

## 2023-08-27 ENCOUNTER — Other Ambulatory Visit: Payer: Self-pay

## 2023-08-27 ENCOUNTER — Encounter: Payer: Self-pay | Admitting: Hematology and Oncology

## 2023-08-27 VITALS — BP 111/45 | HR 77 | Temp 98.6°F | Resp 18 | Ht 67.0 in | Wt 200.6 lb

## 2023-08-27 DIAGNOSIS — R634 Abnormal weight loss: Secondary | ICD-10-CM | POA: Insufficient documentation

## 2023-08-27 DIAGNOSIS — R161 Splenomegaly, not elsewhere classified: Secondary | ICD-10-CM | POA: Diagnosis not present

## 2023-08-27 DIAGNOSIS — R591 Generalized enlarged lymph nodes: Secondary | ICD-10-CM | POA: Diagnosis not present

## 2023-08-27 DIAGNOSIS — D696 Thrombocytopenia, unspecified: Secondary | ICD-10-CM

## 2023-08-27 DIAGNOSIS — D693 Immune thrombocytopenic purpura: Secondary | ICD-10-CM | POA: Diagnosis not present

## 2023-08-27 DIAGNOSIS — C9110 Chronic lymphocytic leukemia of B-cell type not having achieved remission: Secondary | ICD-10-CM

## 2023-08-27 LAB — CBC WITH DIFFERENTIAL/PLATELET
Abs Immature Granulocytes: 0.03 K/uL (ref 0.00–0.07)
Basophils Absolute: 0 K/uL (ref 0.0–0.1)
Basophils Relative: 1 %
Eosinophils Absolute: 0.1 K/uL (ref 0.0–0.5)
Eosinophils Relative: 1 %
HCT: 28.4 % — ABNORMAL LOW (ref 39.0–52.0)
Hemoglobin: 9.5 g/dL — ABNORMAL LOW (ref 13.0–17.0)
Immature Granulocytes: 1 %
Lymphocytes Relative: 30 %
Lymphs Abs: 1.5 K/uL (ref 0.7–4.0)
MCH: 31.7 pg (ref 26.0–34.0)
MCHC: 33.5 g/dL (ref 30.0–36.0)
MCV: 94.7 fL (ref 80.0–100.0)
Monocytes Absolute: 0.6 K/uL (ref 0.1–1.0)
Monocytes Relative: 13 %
Neutro Abs: 2.7 K/uL (ref 1.7–7.7)
Neutrophils Relative %: 54 %
Platelets: 51 K/uL — ABNORMAL LOW (ref 150–400)
RBC: 3 MIL/uL — ABNORMAL LOW (ref 4.22–5.81)
RDW: 16.7 % — ABNORMAL HIGH (ref 11.5–15.5)
WBC: 5 K/uL (ref 4.0–10.5)
nRBC: 0 % (ref 0.0–0.2)

## 2023-08-27 LAB — COMPREHENSIVE METABOLIC PANEL WITH GFR
ALT: 7 U/L (ref 0–44)
AST: 11 U/L — ABNORMAL LOW (ref 15–41)
Albumin: 3.2 g/dL — ABNORMAL LOW (ref 3.5–5.0)
Alkaline Phosphatase: 44 U/L (ref 38–126)
Anion gap: 8 (ref 5–15)
BUN: 24 mg/dL — ABNORMAL HIGH (ref 8–23)
CO2: 31 mmol/L (ref 22–32)
Calcium: 8.1 mg/dL — ABNORMAL LOW (ref 8.9–10.3)
Chloride: 103 mmol/L (ref 98–111)
Creatinine, Ser: 1.27 mg/dL — ABNORMAL HIGH (ref 0.61–1.24)
GFR, Estimated: 53 mL/min — ABNORMAL LOW (ref >60)
Glucose, Bld: 169 mg/dL — ABNORMAL HIGH (ref 70–99)
Potassium: 3.4 mmol/L — ABNORMAL LOW (ref 3.5–5.1)
Sodium: 142 mmol/L (ref 135–145)
Total Bilirubin: 0.6 mg/dL (ref 0.0–1.2)
Total Protein: 6.3 g/dL — ABNORMAL LOW (ref 6.5–8.1)

## 2023-08-27 MED ORDER — PREDNISONE 20 MG PO TABS
20.0000 mg | ORAL_TABLET | Freq: Every day | ORAL | 0 refills | Status: DC
Start: 2023-08-27 — End: 2023-09-06
  Filled 2023-08-27 – 2023-08-28 (×6): qty 60, 60d supply, fill #0

## 2023-08-27 NOTE — Assessment & Plan Note (Addendum)
 The patient was diagnosed with CLL in 2010, Rai stage II with lymphadenopathy and splenomegaly, FISH analysis showed trisomy 23 He received ibrutinib  between April 2017 to November 2023  CT imaging from May showed progressive splenomegaly and lymphadenopathy Calquence  was started on June 1 Treatment was recently interrupted due to progressive thrombocytopenia He resumed treatment again on July 1 and came back with anorexia, poor oral intake, slight acute renal failure and worsening anemia Despite treatment placed on hold and prednisone  therapy, he has progressive pancytopenia with worsening thrombocytopenia We are not able to resume Calquence  I recommend discontinuation of Calquence  He will continue low-dose prednisone  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

## 2023-08-27 NOTE — Assessment & Plan Note (Addendum)
 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

## 2023-08-27 NOTE — Progress Notes (Signed)
 [facility] OFFICE PROGRESS NOTE  Patient Care Team: [name], DO as PCP - General (Family Medicine) Lonn Hicks, MD as Consulting Physician (Hematology and Oncology)  Assessment & Plan CLL (chronic lymphocytic leukemia) Michael [name]) The patient was diagnosed with CLL in 2010, Rai stage II with lymphadenopathy and splenomegaly, FISH analysis showed trisomy 12 He received ibrutinib  between April 2017 to November 2023  CT imaging from May showed progressive splenomegaly and lymphadenopathy Calquence  was started on June 1 Treatment was recently interrupted due to progressive thrombocytopenia He resumed treatment again on July 1 and came back with anorexia, poor oral intake, slight acute renal failure and worsening anemia Despite treatment placed on hold and prednisone  therapy, he has progressive pancytopenia with worsening thrombocytopenia We are not able to resume Calquence  I recommend discontinuation of Calquence  He will continue low-dose prednisone  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 Thrombocytopenia (HCC) 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 He is not responding to prednisone  I recommend trial of rituximab and he agrees We will order hepatitis B panel next week We discussed weekly rituximab for 4 doses to start in 2 weeks I will see him on a weekly basis for further follow-up He will continue on low-dose prednisone  for now I will see him weekly to monitor for signs of bleeding  Orders Placed This Encounter  Procedures   Hepatitis B surface antigen    Standing Status:   Standing    Number of Occurrences:   1    Expiration Date:   08/26/2024   Hepatitis B core antibody, total    Standing Status:   Standing    Number of Occurrences:   1    Expiration Date:   08/26/2024   Lactate dehydrogenase    Standing Status:   Standing    Number of Occurrences:   4     Expiration Date:   08/26/2024   Uric acid    Standing Status:   Standing    Number of Occurrences:   4    Expiration Date:   08/26/2024   VITAMIN D  25 Hydroxy (Vit-D Deficiency, Fractures)    Standing Status:   Future    Expected Date:   09/03/2023    Expiration Date:   08/26/2024     Hicks Lonn, MD  INTERVAL HISTORY: he returns for surveillance follow-up for CLL Since last time I saw him, he continues to feel bad He has lost some weight Despite being on prednisone , his appetite has not improved He denies worsening leg swelling on prednisone  No recent bleeding We discussed risk and benefits of rituximab  PHYSICAL EXAMINATION: ECOG PERFORMANCE STATUS: 2 - Symptomatic, <50% confined to bed  Vitals:   08/27/23 1025  BP: (!) 111/45  Pulse: 77  Resp: 18  Temp: 98.6 F (37 C)  SpO2: 100%   Filed Weights   08/27/23 1025  Weight: 200 lb 9.6 oz (91 kg)    Relevant data reviewed during this visit included CBC and CMP

## 2023-08-28 ENCOUNTER — Other Ambulatory Visit (HOSPITAL_COMMUNITY): Payer: Self-pay

## 2023-08-28 ENCOUNTER — Other Ambulatory Visit: Payer: Self-pay

## 2023-08-30 ENCOUNTER — Inpatient Hospital Stay (HOSPITAL_COMMUNITY)
Admission: EM | Admit: 2023-08-30 | Discharge: 2023-09-27 | DRG: 870 | Disposition: E | Attending: Critical Care Medicine | Admitting: Critical Care Medicine

## 2023-08-30 ENCOUNTER — Other Ambulatory Visit: Payer: Self-pay

## 2023-08-30 ENCOUNTER — Other Ambulatory Visit (HOSPITAL_COMMUNITY): Payer: Self-pay

## 2023-08-30 DIAGNOSIS — I4891 Unspecified atrial fibrillation: Secondary | ICD-10-CM | POA: Diagnosis not present

## 2023-08-30 DIAGNOSIS — R579 Shock, unspecified: Secondary | ICD-10-CM | POA: Diagnosis present

## 2023-08-30 DIAGNOSIS — N179 Acute kidney failure, unspecified: Secondary | ICD-10-CM

## 2023-08-30 DIAGNOSIS — A4152 Sepsis due to Pseudomonas: Secondary | ICD-10-CM | POA: Diagnosis not present

## 2023-08-30 DIAGNOSIS — Z79891 Long term (current) use of opiate analgesic: Secondary | ICD-10-CM

## 2023-08-30 DIAGNOSIS — Z806 Family history of leukemia: Secondary | ICD-10-CM

## 2023-08-30 DIAGNOSIS — R55 Syncope and collapse: Secondary | ICD-10-CM | POA: Diagnosis not present

## 2023-08-30 DIAGNOSIS — G8929 Other chronic pain: Secondary | ICD-10-CM | POA: Diagnosis present

## 2023-08-30 DIAGNOSIS — N1831 Chronic kidney disease, stage 3a: Secondary | ICD-10-CM | POA: Diagnosis present

## 2023-08-30 DIAGNOSIS — Z809 Family history of malignant neoplasm, unspecified: Secondary | ICD-10-CM

## 2023-08-30 DIAGNOSIS — E66812 Obesity, class 2: Secondary | ICD-10-CM | POA: Diagnosis present

## 2023-08-30 DIAGNOSIS — E1151 Type 2 diabetes mellitus with diabetic peripheral angiopathy without gangrene: Secondary | ICD-10-CM | POA: Diagnosis present

## 2023-08-30 DIAGNOSIS — Z7952 Long term (current) use of systemic steroids: Secondary | ICD-10-CM

## 2023-08-30 DIAGNOSIS — R7989 Other specified abnormal findings of blood chemistry: Secondary | ICD-10-CM

## 2023-08-30 DIAGNOSIS — R000 Tachycardia, unspecified: Secondary | ICD-10-CM | POA: Diagnosis not present

## 2023-08-30 DIAGNOSIS — G9341 Metabolic encephalopathy: Secondary | ICD-10-CM | POA: Diagnosis present

## 2023-08-30 DIAGNOSIS — Z79899 Other long term (current) drug therapy: Secondary | ICD-10-CM

## 2023-08-30 DIAGNOSIS — Z961 Presence of intraocular lens: Secondary | ICD-10-CM | POA: Diagnosis present

## 2023-08-30 DIAGNOSIS — J9602 Acute respiratory failure with hypercapnia: Secondary | ICD-10-CM | POA: Diagnosis present

## 2023-08-30 DIAGNOSIS — R404 Transient alteration of awareness: Secondary | ICD-10-CM | POA: Diagnosis not present

## 2023-08-30 DIAGNOSIS — E872 Acidosis, unspecified: Secondary | ICD-10-CM | POA: Diagnosis present

## 2023-08-30 DIAGNOSIS — I890 Lymphedema, not elsewhere classified: Secondary | ICD-10-CM | POA: Diagnosis present

## 2023-08-30 DIAGNOSIS — E871 Hypo-osmolality and hyponatremia: Secondary | ICD-10-CM | POA: Diagnosis present

## 2023-08-30 DIAGNOSIS — U071 COVID-19: Secondary | ICD-10-CM | POA: Diagnosis present

## 2023-08-30 DIAGNOSIS — D6959 Other secondary thrombocytopenia: Secondary | ICD-10-CM | POA: Diagnosis present

## 2023-08-30 DIAGNOSIS — Z87891 Personal history of nicotine dependence: Secondary | ICD-10-CM

## 2023-08-30 DIAGNOSIS — D693 Immune thrombocytopenic purpura: Secondary | ICD-10-CM | POA: Diagnosis present

## 2023-08-30 DIAGNOSIS — Z833 Family history of diabetes mellitus: Secondary | ICD-10-CM

## 2023-08-30 DIAGNOSIS — E1165 Type 2 diabetes mellitus with hyperglycemia: Secondary | ICD-10-CM | POA: Diagnosis present

## 2023-08-30 DIAGNOSIS — C9110 Chronic lymphocytic leukemia of B-cell type not having achieved remission: Secondary | ICD-10-CM | POA: Diagnosis present

## 2023-08-30 DIAGNOSIS — Z8249 Family history of ischemic heart disease and other diseases of the circulatory system: Secondary | ICD-10-CM

## 2023-08-30 DIAGNOSIS — F05 Delirium due to known physiological condition: Secondary | ICD-10-CM | POA: Diagnosis not present

## 2023-08-30 DIAGNOSIS — M7981 Nontraumatic hematoma of soft tissue: Secondary | ICD-10-CM | POA: Diagnosis not present

## 2023-08-30 DIAGNOSIS — Z515 Encounter for palliative care: Secondary | ICD-10-CM

## 2023-08-30 DIAGNOSIS — D61818 Other pancytopenia: Secondary | ICD-10-CM | POA: Diagnosis present

## 2023-08-30 DIAGNOSIS — Z6835 Body mass index (BMI) 35.0-35.9, adult: Secondary | ICD-10-CM

## 2023-08-30 DIAGNOSIS — E8809 Other disorders of plasma-protein metabolism, not elsewhere classified: Secondary | ICD-10-CM | POA: Diagnosis present

## 2023-08-30 DIAGNOSIS — N170 Acute kidney failure with tubular necrosis: Secondary | ICD-10-CM | POA: Diagnosis present

## 2023-08-30 DIAGNOSIS — A419 Sepsis, unspecified organism: Principal | ICD-10-CM

## 2023-08-30 DIAGNOSIS — E1122 Type 2 diabetes mellitus with diabetic chronic kidney disease: Secondary | ICD-10-CM | POA: Diagnosis present

## 2023-08-30 DIAGNOSIS — Z66 Do not resuscitate: Secondary | ICD-10-CM | POA: Diagnosis present

## 2023-08-30 DIAGNOSIS — K219 Gastro-esophageal reflux disease without esophagitis: Secondary | ICD-10-CM | POA: Diagnosis present

## 2023-08-30 DIAGNOSIS — J1282 Pneumonia due to coronavirus disease 2019: Secondary | ICD-10-CM | POA: Diagnosis present

## 2023-08-30 DIAGNOSIS — R6521 Severe sepsis with septic shock: Secondary | ICD-10-CM | POA: Diagnosis present

## 2023-08-30 DIAGNOSIS — E11649 Type 2 diabetes mellitus with hypoglycemia without coma: Secondary | ICD-10-CM | POA: Diagnosis not present

## 2023-08-30 DIAGNOSIS — J9601 Acute respiratory failure with hypoxia: Secondary | ICD-10-CM | POA: Diagnosis present

## 2023-08-30 MED ORDER — ROCURONIUM BROMIDE 10 MG/ML (PF) SYRINGE
PREFILLED_SYRINGE | INTRAVENOUS | Status: AC | PRN
  Administered 2023-08-30: 100 mg via INTRAVENOUS

## 2023-08-30 MED ORDER — ETOMIDATE 2 MG/ML IV SOLN
INTRAVENOUS | Status: AC | PRN
  Administered 2023-08-30: 20 mg via INTRAVENOUS

## 2023-08-30 NOTE — ED Triage Notes (Signed)
 Pt BIB EMS w/ Resp distress from home. Per EMS, pt was at home at the table and suddenly went unresponsive; witnessed by family. Upon EMS arrival pt was breathing on his own, but unresponsive. BP 60/30 intially. 500cc of NS given, BP up to 101/54 in route. EMS assisting ventilations on arrival.

## 2023-08-30 NOTE — ED Notes (Signed)
 Etomidate  2356 Roc 2357 Intubation complete at 2358 by Raford MD

## 2023-08-31 ENCOUNTER — Inpatient Hospital Stay (HOSPITAL_COMMUNITY)

## 2023-08-31 ENCOUNTER — Emergency Department (HOSPITAL_COMMUNITY)

## 2023-08-31 ENCOUNTER — Other Ambulatory Visit: Payer: Self-pay

## 2023-08-31 DIAGNOSIS — N170 Acute kidney failure with tubular necrosis: Secondary | ICD-10-CM | POA: Diagnosis not present

## 2023-08-31 DIAGNOSIS — J1282 Pneumonia due to coronavirus disease 2019: Secondary | ICD-10-CM

## 2023-08-31 DIAGNOSIS — A419 Sepsis, unspecified organism: Secondary | ICD-10-CM | POA: Diagnosis not present

## 2023-08-31 DIAGNOSIS — R918 Other nonspecific abnormal finding of lung field: Secondary | ICD-10-CM | POA: Diagnosis not present

## 2023-08-31 DIAGNOSIS — J90 Pleural effusion, not elsewhere classified: Secondary | ICD-10-CM | POA: Diagnosis not present

## 2023-08-31 DIAGNOSIS — N1831 Chronic kidney disease, stage 3a: Secondary | ICD-10-CM | POA: Diagnosis not present

## 2023-08-31 DIAGNOSIS — J9600 Acute respiratory failure, unspecified whether with hypoxia or hypercapnia: Secondary | ICD-10-CM

## 2023-08-31 DIAGNOSIS — E66812 Obesity, class 2: Secondary | ICD-10-CM | POA: Diagnosis not present

## 2023-08-31 DIAGNOSIS — R579 Shock, unspecified: Secondary | ICD-10-CM | POA: Diagnosis not present

## 2023-08-31 DIAGNOSIS — Z452 Encounter for adjustment and management of vascular access device: Secondary | ICD-10-CM | POA: Diagnosis not present

## 2023-08-31 DIAGNOSIS — R0902 Hypoxemia: Secondary | ICD-10-CM | POA: Diagnosis not present

## 2023-08-31 DIAGNOSIS — J9601 Acute respiratory failure with hypoxia: Secondary | ICD-10-CM | POA: Diagnosis not present

## 2023-08-31 DIAGNOSIS — C9110 Chronic lymphocytic leukemia of B-cell type not having achieved remission: Secondary | ICD-10-CM | POA: Diagnosis not present

## 2023-08-31 DIAGNOSIS — N179 Acute kidney failure, unspecified: Secondary | ICD-10-CM

## 2023-08-31 DIAGNOSIS — E1122 Type 2 diabetes mellitus with diabetic chronic kidney disease: Secondary | ICD-10-CM | POA: Diagnosis not present

## 2023-08-31 DIAGNOSIS — J9811 Atelectasis: Secondary | ICD-10-CM | POA: Diagnosis not present

## 2023-08-31 DIAGNOSIS — D696 Thrombocytopenia, unspecified: Secondary | ICD-10-CM | POA: Diagnosis not present

## 2023-08-31 DIAGNOSIS — B965 Pseudomonas (aeruginosa) (mallei) (pseudomallei) as the cause of diseases classified elsewhere: Secondary | ICD-10-CM | POA: Diagnosis not present

## 2023-08-31 DIAGNOSIS — Z66 Do not resuscitate: Secondary | ICD-10-CM | POA: Diagnosis not present

## 2023-08-31 DIAGNOSIS — D693 Immune thrombocytopenic purpura: Secondary | ICD-10-CM | POA: Diagnosis not present

## 2023-08-31 DIAGNOSIS — E11649 Type 2 diabetes mellitus with hypoglycemia without coma: Secondary | ICD-10-CM | POA: Diagnosis not present

## 2023-08-31 DIAGNOSIS — J9602 Acute respiratory failure with hypercapnia: Secondary | ICD-10-CM | POA: Diagnosis not present

## 2023-08-31 DIAGNOSIS — E872 Acidosis, unspecified: Secondary | ICD-10-CM | POA: Diagnosis not present

## 2023-08-31 DIAGNOSIS — I2510 Atherosclerotic heart disease of native coronary artery without angina pectoris: Secondary | ICD-10-CM | POA: Diagnosis not present

## 2023-08-31 DIAGNOSIS — E1151 Type 2 diabetes mellitus with diabetic peripheral angiopathy without gangrene: Secondary | ICD-10-CM | POA: Diagnosis not present

## 2023-08-31 DIAGNOSIS — G9341 Metabolic encephalopathy: Secondary | ICD-10-CM | POA: Diagnosis not present

## 2023-08-31 DIAGNOSIS — E119 Type 2 diabetes mellitus without complications: Secondary | ICD-10-CM

## 2023-08-31 DIAGNOSIS — D61818 Other pancytopenia: Secondary | ICD-10-CM | POA: Diagnosis not present

## 2023-08-31 DIAGNOSIS — U071 COVID-19: Secondary | ICD-10-CM | POA: Diagnosis not present

## 2023-08-31 DIAGNOSIS — Z515 Encounter for palliative care: Secondary | ICD-10-CM | POA: Diagnosis not present

## 2023-08-31 DIAGNOSIS — E8729 Other acidosis: Secondary | ICD-10-CM | POA: Diagnosis not present

## 2023-08-31 DIAGNOSIS — E871 Hypo-osmolality and hyponatremia: Secondary | ICD-10-CM | POA: Diagnosis not present

## 2023-08-31 DIAGNOSIS — R0603 Acute respiratory distress: Secondary | ICD-10-CM | POA: Diagnosis not present

## 2023-08-31 DIAGNOSIS — A4150 Gram-negative sepsis, unspecified: Secondary | ICD-10-CM | POA: Diagnosis not present

## 2023-08-31 DIAGNOSIS — A4152 Sepsis due to Pseudomonas: Secondary | ICD-10-CM | POA: Diagnosis not present

## 2023-08-31 DIAGNOSIS — Z4682 Encounter for fitting and adjustment of non-vascular catheter: Secondary | ICD-10-CM | POA: Diagnosis not present

## 2023-08-31 DIAGNOSIS — I4891 Unspecified atrial fibrillation: Secondary | ICD-10-CM | POA: Diagnosis not present

## 2023-08-31 DIAGNOSIS — F05 Delirium due to known physiological condition: Secondary | ICD-10-CM | POA: Diagnosis not present

## 2023-08-31 DIAGNOSIS — E1165 Type 2 diabetes mellitus with hyperglycemia: Secondary | ICD-10-CM | POA: Diagnosis not present

## 2023-08-31 DIAGNOSIS — R6521 Severe sepsis with septic shock: Secondary | ICD-10-CM | POA: Diagnosis not present

## 2023-08-31 LAB — BLOOD CULTURE ID PANEL (REFLEXED) - BCID2

## 2023-08-31 LAB — COMPREHENSIVE METABOLIC PANEL WITH GFR
ALT: 16 U/L (ref 0–44)
ALT: 19 U/L (ref 0–44)
AST: 33 U/L (ref 15–41)
AST: 51 U/L — ABNORMAL HIGH (ref 15–41)
Albumin: 2 g/dL — ABNORMAL LOW (ref 3.5–5.0)
Albumin: 1.6 g/dL — ABNORMAL LOW (ref 3.5–5.0)
Alkaline Phosphatase: 38 U/L (ref 38–126)
Alkaline Phosphatase: 45 U/L (ref 38–126)
Anion gap: 11 (ref 5–15)
Anion gap: 15 (ref 5–15)
BUN: 30 mg/dL — ABNORMAL HIGH (ref 8–23)
BUN: 34 mg/dL — ABNORMAL HIGH (ref 8–23)
CO2: 24 mmol/L (ref 22–32)
CO2: 19 mmol/L — ABNORMAL LOW (ref 22–32)
Calcium: 7.4 mg/dL — ABNORMAL LOW (ref 8.9–10.3)
Calcium: 6.6 mg/dL — ABNORMAL LOW (ref 8.9–10.3)
Chloride: 101 mmol/L (ref 98–111)
Chloride: 100 mmol/L (ref 98–111)
Creatinine, Ser: 2.13 mg/dL — ABNORMAL HIGH (ref 0.61–1.24)
Creatinine, Ser: 2.07 mg/dL — ABNORMAL HIGH (ref 0.61–1.24)
GFR, Estimated: 29 mL/min — ABNORMAL LOW (ref >60)
GFR, Estimated: 30 mL/min — ABNORMAL LOW (ref >60)
Glucose, Bld: 193 mg/dL — ABNORMAL HIGH (ref 70–99)
Glucose, Bld: 151 mg/dL — ABNORMAL HIGH (ref 70–99)
Potassium: 3.7 mmol/L (ref 3.5–5.1)
Potassium: 4.2 mmol/L (ref 3.5–5.1)
Sodium: 136 mmol/L (ref 135–145)
Sodium: 134 mmol/L — ABNORMAL LOW (ref 135–145)
Total Bilirubin: 1.1 mg/dL (ref 0.0–1.2)
Total Bilirubin: 1 mg/dL (ref 0.0–1.2)
Total Protein: 5.1 g/dL — ABNORMAL LOW (ref 6.5–8.1)
Total Protein: 4.6 g/dL — ABNORMAL LOW (ref 6.5–8.1)

## 2023-08-31 LAB — GLUCOSE, CAPILLARY
Glucose-Capillary: 141 mg/dL — ABNORMAL HIGH (ref 70–99)
Glucose-Capillary: 127 mg/dL — ABNORMAL HIGH (ref 70–99)
Glucose-Capillary: 132 mg/dL — ABNORMAL HIGH (ref 70–99)
Glucose-Capillary: 156 mg/dL — ABNORMAL HIGH (ref 70–99)
Glucose-Capillary: 170 mg/dL — ABNORMAL HIGH (ref 70–99)
Glucose-Capillary: 179 mg/dL — ABNORMAL HIGH (ref 70–99)

## 2023-08-31 LAB — I-STAT CG4 LACTIC ACID, ED
Lactic Acid, Venous: 4.9 mmol/L (ref 0.5–1.9)
Lactic Acid, Venous: 6 mmol/L (ref 0.5–1.9)

## 2023-08-31 LAB — URINALYSIS, W/ REFLEX TO CULTURE (INFECTION SUSPECTED)
Bilirubin Urine: NEGATIVE
Glucose, UA: 50 mg/dL — AB
Ketones, ur: NEGATIVE mg/dL
Leukocytes,Ua: NEGATIVE
Nitrite: NEGATIVE
Protein, ur: 30 mg/dL — AB
Specific Gravity, Urine: 1.015 (ref 1.005–1.030)
pH: 5 (ref 5.0–8.0)

## 2023-08-31 LAB — CBC
HCT: 24.7 % — ABNORMAL LOW (ref 39.0–52.0)
Hemoglobin: 7.7 g/dL — ABNORMAL LOW (ref 13.0–17.0)
MCH: 30.8 pg (ref 26.0–34.0)
MCHC: 31.2 g/dL (ref 30.0–36.0)
MCV: 98.8 fL (ref 80.0–100.0)
Platelets: 27 K/uL — CL (ref 150–400)
RBC: 2.5 MIL/uL — ABNORMAL LOW (ref 4.22–5.81)
RDW: 15.9 % — ABNORMAL HIGH (ref 11.5–15.5)
WBC: 6.5 K/uL (ref 4.0–10.5)
nRBC: 2.3 % — ABNORMAL HIGH (ref 0.0–0.2)

## 2023-08-31 LAB — I-STAT ARTERIAL BLOOD GAS, ED
Acid-base deficit: 3 mmol/L — ABNORMAL HIGH (ref 0.0–2.0)
Acid-base deficit: 4 mmol/L — ABNORMAL HIGH (ref 0.0–2.0)
Bicarbonate: 25.8 mmol/L (ref 20.0–28.0)
Bicarbonate: 25.9 mmol/L (ref 20.0–28.0)
Calcium, Ion: 1.12 mmol/L — ABNORMAL LOW (ref 1.15–1.40)
Calcium, Ion: 1.14 mmol/L — ABNORMAL LOW (ref 1.15–1.40)
HCT: 24 % — ABNORMAL LOW (ref 39.0–52.0)
HCT: 26 % — ABNORMAL LOW (ref 39.0–52.0)
Hemoglobin: 8.2 g/dL — ABNORMAL LOW (ref 13.0–17.0)
Hemoglobin: 8.8 g/dL — ABNORMAL LOW (ref 13.0–17.0)
O2 Saturation: 57 %
O2 Saturation: 98 %
Patient temperature: 102.1
Patient temperature: 103.1
Potassium: 3.5 mmol/L (ref 3.5–5.1)
Potassium: 3.6 mmol/L (ref 3.5–5.1)
Sodium: 138 mmol/L (ref 135–145)
Sodium: 138 mmol/L (ref 135–145)
TCO2: 28 mmol/L (ref 22–32)
TCO2: 28 mmol/L (ref 22–32)
pCO2 arterial: 74.8 mmHg (ref 32–48)
pCO2 arterial: 80.8 mmHg (ref 32–48)
pH, Arterial: 7.123 — CL (ref 7.35–7.45)
pH, Arterial: 7.161 — CL (ref 7.35–7.45)
pO2, Arterial: 161 mmHg — ABNORMAL HIGH (ref 83–108)
pO2, Arterial: 45 mmHg — ABNORMAL LOW (ref 83–108)

## 2023-08-31 LAB — STREP PNEUMONIAE URINARY ANTIGEN: Strep Pneumo Urinary Antigen: NEGATIVE

## 2023-08-31 LAB — POCT I-STAT 7, (LYTES, BLD GAS, ICA,H+H)
Acid-base deficit: 10 mmol/L — ABNORMAL HIGH (ref 0.0–2.0)
Acid-base deficit: 6 mmol/L — ABNORMAL HIGH (ref 0.0–2.0)
Bicarbonate: 18.7 mmol/L — ABNORMAL LOW (ref 20.0–28.0)
Bicarbonate: 19.2 mmol/L — ABNORMAL LOW (ref 20.0–28.0)
Calcium, Ion: 1.1 mmol/L — ABNORMAL LOW (ref 1.15–1.40)
Calcium, Ion: 0.99 mmol/L — ABNORMAL LOW (ref 1.15–1.40)
HCT: 28 % — ABNORMAL LOW (ref 39.0–52.0)
HCT: 23 % — ABNORMAL LOW (ref 39.0–52.0)
Hemoglobin: 9.5 g/dL — ABNORMAL LOW (ref 13.0–17.0)
Hemoglobin: 7.8 g/dL — ABNORMAL LOW (ref 13.0–17.0)
O2 Saturation: 92 %
O2 Saturation: 99 %
Patient temperature: 37.8
Patient temperature: 39.1
Potassium: 3.8 mmol/L (ref 3.5–5.1)
Potassium: 4 mmol/L (ref 3.5–5.1)
Sodium: 135 mmol/L (ref 135–145)
Sodium: 134 mmol/L — ABNORMAL LOW (ref 135–145)
TCO2: 20 mmol/L — ABNORMAL LOW (ref 22–32)
TCO2: 20 mmol/L — ABNORMAL LOW (ref 22–32)
pCO2 arterial: 58.8 mmHg — ABNORMAL HIGH (ref 32–48)
pCO2 arterial: 36.2 mmHg (ref 32–48)
pH, Arterial: 7.115 — CL (ref 7.35–7.45)
pH, Arterial: 7.341 — ABNORMAL LOW (ref 7.35–7.45)
pO2, Arterial: 90 mmHg (ref 83–108)
pO2, Arterial: 140 mmHg — ABNORMAL HIGH (ref 83–108)

## 2023-08-31 LAB — RENAL FUNCTION PANEL
Albumin: <1.5 g/dL — ABNORMAL LOW (ref 3.5–5.0)
Anion gap: 14 (ref 5–15)
BUN: 36 mg/dL — ABNORMAL HIGH (ref 8–23)
CO2: 19 mmol/L — ABNORMAL LOW (ref 22–32)
Calcium: 7.3 mg/dL — ABNORMAL LOW (ref 8.9–10.3)
Chloride: 99 mmol/L (ref 98–111)
Creatinine, Ser: 1.94 mg/dL — ABNORMAL HIGH (ref 0.61–1.24)
GFR, Estimated: 32 mL/min — ABNORMAL LOW (ref >60)
Glucose, Bld: 158 mg/dL — ABNORMAL HIGH (ref 70–99)
Phosphorus: 4.5 mg/dL (ref 2.5–4.6)
Potassium: 4.3 mmol/L (ref 3.5–5.1)
Sodium: 132 mmol/L — ABNORMAL LOW (ref 135–145)

## 2023-08-31 LAB — RESP PANEL BY RT-PCR (RSV, FLU A&B, COVID)  RVPGX2
Influenza A by PCR: NEGATIVE
Influenza B by PCR: NEGATIVE
Resp Syncytial Virus by PCR: NEGATIVE
SARS Coronavirus 2 by RT PCR: POSITIVE — AB

## 2023-08-31 LAB — CBC WITH DIFFERENTIAL/PLATELET
Abs Immature Granulocytes: 0 K/uL (ref 0.00–0.07)
Basophils Absolute: 0 K/uL (ref 0.0–0.1)
Basophils Relative: 0 %
Eosinophils Absolute: 0 K/uL (ref 0.0–0.5)
Eosinophils Relative: 0 %
HCT: 25.5 % — ABNORMAL LOW (ref 39.0–52.0)
Hemoglobin: 7.9 g/dL — ABNORMAL LOW (ref 13.0–17.0)
Immature Granulocytes: 0 %
Lymphocytes Relative: 58 %
Lymphs Abs: 2 K/uL (ref 0.7–4.0)
MCH: 30.7 pg (ref 26.0–34.0)
MCHC: 31 g/dL (ref 30.0–36.0)
MCV: 99.2 fL (ref 80.0–100.0)
Monocytes Absolute: 0.9 K/uL (ref 0.1–1.0)
Monocytes Relative: 27 %
Neutro Abs: 0.5 K/uL — ABNORMAL LOW (ref 1.7–7.7)
Neutrophils Relative %: 15 %
Platelets: 40 K/uL — ABNORMAL LOW (ref 150–400)
RBC: 2.57 MIL/uL — ABNORMAL LOW (ref 4.22–5.81)
RDW: 15.9 % — ABNORMAL HIGH (ref 11.5–15.5)
Smear Review: NORMAL
WBC: 3.5 K/uL — ABNORMAL LOW (ref 4.0–10.5)
nRBC: 0 % (ref 0.0–0.2)

## 2023-08-31 LAB — BASIC METABOLIC PANEL WITH GFR
Anion gap: 16 — ABNORMAL HIGH (ref 5–15)
BUN: 29 mg/dL — ABNORMAL HIGH (ref 8–23)
CO2: 33 mmol/L — ABNORMAL HIGH (ref 22–32)
Calcium: 5.7 mg/dL — CL (ref 8.9–10.3)
Chloride: 86 mmol/L — ABNORMAL LOW (ref 98–111)
Creatinine, Ser: 2.01 mg/dL — ABNORMAL HIGH (ref 0.61–1.24)
GFR, Estimated: 31 mL/min — ABNORMAL LOW (ref >60)
Glucose, Bld: 626 mg/dL (ref 70–99)
Potassium: 3.5 mmol/L (ref 3.5–5.1)
Sodium: 135 mmol/L (ref 135–145)

## 2023-08-31 LAB — COOXEMETRY PANEL
Carboxyhemoglobin: 1.5 % (ref 0.5–1.5)
Methemoglobin: 1.5 % (ref 0.0–1.5)
O2 Saturation: 74.2 %
Total hemoglobin: 8.9 g/dL — ABNORMAL LOW (ref 12.0–16.0)

## 2023-08-31 LAB — PROCALCITONIN: Procalcitonin: 75.2 ng/mL

## 2023-08-31 LAB — MAGNESIUM
Magnesium: 1.3 mg/dL — ABNORMAL LOW (ref 1.7–2.4)
Magnesium: 1.2 mg/dL — ABNORMAL LOW (ref 1.7–2.4)
Magnesium: 1.4 mg/dL — ABNORMAL LOW (ref 1.7–2.4)

## 2023-08-31 LAB — TROPONIN I (HIGH SENSITIVITY)
Troponin I (High Sensitivity): 25 ng/L — ABNORMAL HIGH (ref <18)
Troponin I (High Sensitivity): 31 ng/L — ABNORMAL HIGH (ref <18)

## 2023-08-31 LAB — PROTIME-INR
INR: 1.3 — ABNORMAL HIGH (ref 0.8–1.2)
Prothrombin Time: 17.3 s — ABNORMAL HIGH (ref 11.4–15.2)

## 2023-08-31 LAB — LACTIC ACID, PLASMA
Lactic Acid, Venous: 7.2 mmol/L (ref 0.5–1.9)
Lactic Acid, Venous: 6 mmol/L (ref 0.5–1.9)
Lactic Acid, Venous: 6.5 mmol/L (ref 0.5–1.9)

## 2023-08-31 LAB — PHOSPHORUS
Phosphorus: 4.1 mg/dL (ref 2.5–4.6)
Phosphorus: 4.5 mg/dL (ref 2.5–4.6)

## 2023-08-31 LAB — MRSA NEXT GEN BY PCR, NASAL: MRSA by PCR Next Gen: NOT DETECTED

## 2023-08-31 LAB — BRAIN NATRIURETIC PEPTIDE: B Natriuretic Peptide: 464.6 pg/mL — ABNORMAL HIGH (ref 0.0–100.0)

## 2023-08-31 MED ORDER — ORAL CARE MOUTH RINSE: 15.0000 mL | OROMUCOSAL | Status: DC | PRN

## 2023-08-31 MED ORDER — VASOPRESSIN 20 UNITS/100 ML INFUSION FOR SHOCK
0.0000 [IU]/min | INTRAVENOUS | Status: DC
  Administered 2023-08-31 – 2023-09-04 (×15): 0.04 [IU]/min via INTRAVENOUS
  Administered 2023-09-05 (×2): 0.03 [IU]/min via INTRAVENOUS
  Filled 2023-08-31 (×10): qty 100
  Filled 2023-08-31: qty 200
  Filled 2023-08-31 (×5): qty 100

## 2023-08-31 MED ORDER — LACTATED RINGERS IV BOLUS
1000.0000 mL | Freq: Once | INTRAVENOUS | Status: AC
  Administered 2023-08-31: 1000 mL via INTRAVENOUS

## 2023-08-31 MED ORDER — ACETAMINOPHEN 160 MG/5ML PO SOLN
650.0000 mg | ORAL | Status: DC | PRN
  Administered 2023-08-31 – 2023-09-02 (×4): 650 mg
  Filled 2023-08-31 (×4): qty 20.3

## 2023-08-31 MED ORDER — THIAMINE MONONITRATE 100 MG PO TABS
100.0000 mg | ORAL_TABLET | Freq: Every day | ORAL | Status: DC
  Administered 2023-08-31 – 2023-09-02 (×3): 100 mg
  Filled 2023-08-31 (×4): qty 1

## 2023-08-31 MED ORDER — POTASSIUM CHLORIDE 20 MEQ PO PACK
40.0000 meq | PACK | Freq: Once | ORAL | Status: AC
  Administered 2023-08-31: 40 meq
  Filled 2023-08-31: qty 2

## 2023-08-31 MED ORDER — ALBUTEROL SULFATE (2.5 MG/3ML) 0.083% IN NEBU
10.0000 mg | INHALATION_SOLUTION | Freq: Once | RESPIRATORY_TRACT | Status: AC
  Administered 2023-08-31: 10 mg via RESPIRATORY_TRACT
  Filled 2023-08-31: qty 12

## 2023-08-31 MED ORDER — VITAL 1.5 CAL PO LIQD
1000.0000 mL | ORAL | Status: DC
  Administered 2023-08-31: 1000 mL
  Filled 2023-08-31: qty 1000

## 2023-08-31 MED ORDER — HYDROCORTISONE SOD SUC (PF) 100 MG IJ SOLR
100.0000 mg | Freq: Two times a day (BID) | INTRAMUSCULAR | Status: DC
  Administered 2023-08-31 – 2023-09-01 (×3): 100 mg via INTRAVENOUS
  Filled 2023-08-31 (×3): qty 2

## 2023-08-31 MED ORDER — REVEFENACIN 175 MCG/3ML IN SOLN
175.0000 ug | Freq: Every day | RESPIRATORY_TRACT | Status: DC
  Administered 2023-08-31 – 2023-09-05 (×6): 175 ug via RESPIRATORY_TRACT
  Filled 2023-08-31 (×6): qty 3

## 2023-08-31 MED ORDER — DOCUSATE SODIUM 100 MG PO CAPS: 100.0000 mg | ORAL_CAPSULE | Freq: Two times a day (BID) | ORAL | Status: DC | PRN

## 2023-08-31 MED ORDER — ACETAMINOPHEN 650 MG RE SUPP
650.0000 mg | Freq: Once | RECTAL | Status: AC
  Administered 2023-08-31: 650 mg via RECTAL
  Filled 2023-08-31: qty 1

## 2023-08-31 MED ORDER — VANCOMYCIN HCL 750 MG/150ML IV SOLN
750.0000 mg | INTRAVENOUS | Status: DC
  Filled 2023-08-31: qty 150

## 2023-08-31 MED ORDER — CHLORHEXIDINE GLUCONATE CLOTH 2 % EX PADS
6.0000 | MEDICATED_PAD | Freq: Every day | CUTANEOUS | Status: DC
  Administered 2023-08-31 – 2023-09-05 (×6): 6 via TOPICAL

## 2023-08-31 MED ORDER — POLYETHYLENE GLYCOL 3350 17 G PO PACK
17.0000 g | PACK | Freq: Every day | ORAL | Status: DC | PRN
Start: 2023-08-31 — End: 2023-08-31

## 2023-08-31 MED ORDER — CALCIUM GLUCONATE-NACL 2-0.675 GM/100ML-% IV SOLN
2.0000 g | Freq: Once | INTRAVENOUS | Status: AC
  Administered 2023-08-31: 2000 mg via INTRAVENOUS
  Filled 2023-08-31: qty 100

## 2023-08-31 MED ORDER — SODIUM CHLORIDE 0.9 % IV SOLN
2.0000 g | Freq: Once | INTRAVENOUS | Status: AC
  Administered 2023-08-31: 2 g via INTRAVENOUS
  Filled 2023-08-31: qty 12.5

## 2023-08-31 MED ORDER — MIDAZOLAM HCL 2 MG/2ML IJ SOLN
2.0000 mg | Freq: Once | INTRAMUSCULAR | Status: AC
  Administered 2023-08-31: 2 mg via INTRAVENOUS

## 2023-08-31 MED ORDER — DEXTROSE 50 % IV SOLN: 0.0000 mL | INTRAVENOUS | Status: DC | PRN

## 2023-08-31 MED ORDER — INSULIN REGULAR(HUMAN) IN NACL 100-0.9 UT/100ML-% IV SOLN: INTRAVENOUS | Status: DC

## 2023-08-31 MED ORDER — DOCUSATE SODIUM 50 MG/5ML PO LIQD
100.0000 mg | Freq: Two times a day (BID) | ORAL | Status: DC
  Administered 2023-09-01 – 2023-09-02 (×2): 100 mg
  Filled 2023-08-31 (×3): qty 10

## 2023-08-31 MED ORDER — FENTANYL BOLUS VIA INFUSION
100.0000 ug | Freq: Once | INTRAVENOUS | Status: AC
  Administered 2023-08-31: 100 ug via INTRAVENOUS
  Filled 2023-08-31: qty 100

## 2023-08-31 MED ORDER — MAGNESIUM SULFATE 2 GM/50ML IV SOLN
2.0000 g | Freq: Once | INTRAVENOUS | Status: AC
  Administered 2023-08-31: 2 g via INTRAVENOUS
  Filled 2023-08-31: qty 50

## 2023-08-31 MED ORDER — MIDAZOLAM HCL 2 MG/2ML IJ SOLN
INTRAMUSCULAR | Status: AC
  Filled 2023-08-31: qty 2

## 2023-08-31 MED ORDER — PANTOPRAZOLE SODIUM 40 MG IV SOLR
40.0000 mg | Freq: Every day | INTRAVENOUS | Status: DC
  Administered 2023-08-31: 40 mg via INTRAVENOUS
  Filled 2023-08-31: qty 10

## 2023-08-31 MED ORDER — POLYETHYLENE GLYCOL 3350 17 G PO PACK
17.0000 g | PACK | Freq: Every day | ORAL | Status: DC
  Administered 2023-09-01: 17 g
  Filled 2023-08-31 (×2): qty 1

## 2023-08-31 MED ORDER — MAGNESIUM SULFATE 2 GM/50ML IV SOLN
2.0000 g | INTRAVENOUS | Status: AC
  Administered 2023-08-31 (×2): 2 g via INTRAVENOUS
  Filled 2023-08-31 (×2): qty 50

## 2023-08-31 MED ORDER — MIDAZOLAM HCL 2 MG/2ML IJ SOLN
4.0000 mg | Freq: Once | INTRAMUSCULAR | Status: AC
  Administered 2023-08-31: 4 mg via INTRAVENOUS

## 2023-08-31 MED ORDER — CALCIUM GLUCONATE-NACL 1-0.675 GM/50ML-% IV SOLN
1.0000 g | Freq: Once | INTRAVENOUS | Status: AC
  Administered 2023-08-31: 1000 mg via INTRAVENOUS
  Filled 2023-08-31: qty 50

## 2023-08-31 MED ORDER — IOHEXOL 350 MG/ML SOLN
65.0000 mL | Freq: Once | INTRAVENOUS | Status: AC | PRN
  Administered 2023-08-31: 65 mL via INTRAVENOUS

## 2023-08-31 MED ORDER — VANCOMYCIN HCL 2000 MG/400ML IV SOLN
2000.0000 mg | Freq: Once | INTRAVENOUS | Status: AC
  Administered 2023-08-31: 2000 mg via INTRAVENOUS
  Filled 2023-08-31: qty 400

## 2023-08-31 MED ORDER — ORAL CARE MOUTH RINSE
15.0000 mL | OROMUCOSAL | Status: DC
  Administered 2023-08-31 – 2023-09-05 (×62): 15 mL via OROMUCOSAL

## 2023-08-31 MED ORDER — IPRATROPIUM-ALBUTEROL 0.5-2.5 (3) MG/3ML IN SOLN: 3.0000 mL | RESPIRATORY_TRACT | Status: DC | PRN

## 2023-08-31 MED ORDER — NOREPINEPHRINE 4 MG/250ML-% IV SOLN
0.0000 ug/min | INTRAVENOUS | Status: DC
  Administered 2023-08-31 (×3): 28 ug/min via INTRAVENOUS
  Administered 2023-08-31: 4 ug/min via INTRAVENOUS
  Administered 2023-08-31 (×2): 30 ug/min via INTRAVENOUS
  Administered 2023-08-31: 26 ug/min via INTRAVENOUS
  Administered 2023-08-31 (×3): 28 ug/min via INTRAVENOUS
  Administered 2023-09-01: 23 ug/min via INTRAVENOUS
  Administered 2023-09-01: 15 ug/min via INTRAVENOUS
  Administered 2023-09-01: 11 ug/min via INTRAVENOUS
  Administered 2023-09-01: 18 ug/min via INTRAVENOUS
  Administered 2023-09-01: 21 ug/min via INTRAVENOUS
  Administered 2023-09-02: 13 ug/min via INTRAVENOUS
  Administered 2023-09-02 (×2): 14 ug/min via INTRAVENOUS
  Administered 2023-09-02: 13 ug/min via INTRAVENOUS
  Administered 2023-09-02: 10 ug/min via INTRAVENOUS
  Administered 2023-09-03: 11 ug/min via INTRAVENOUS
  Administered 2023-09-03 (×2): 13 ug/min via INTRAVENOUS
  Administered 2023-09-04: 6 ug/min via INTRAVENOUS
  Administered 2023-09-04: 9 ug/min via INTRAVENOUS
  Administered 2023-09-04: 8 ug/min via INTRAVENOUS
  Administered 2023-09-05: 5 ug/min via INTRAVENOUS
  Filled 2023-08-31 (×4): qty 250
  Filled 2023-08-31: qty 500
  Filled 2023-08-31 (×3): qty 250
  Filled 2023-08-31: qty 750
  Filled 2023-08-31 (×11): qty 250
  Filled 2023-08-31: qty 500
  Filled 2023-08-31 (×3): qty 250

## 2023-08-31 MED ORDER — LORAZEPAM 2 MG/ML IJ SOLN: 2.0000 mg | Freq: Once | INTRAMUSCULAR | Status: DC

## 2023-08-31 MED ORDER — SODIUM BICARBONATE 8.4 % IV SOLN
INTRAVENOUS | Status: AC
  Filled 2023-08-31: qty 50

## 2023-08-31 MED ORDER — SODIUM CHLORIDE 0.9 % IV SOLN
2.0000 g | INTRAVENOUS | Status: DC
  Administered 2023-08-31: 2 g via INTRAVENOUS
  Filled 2023-08-31: qty 12.5

## 2023-08-31 MED ORDER — METRONIDAZOLE 500 MG/100ML IV SOLN
500.0000 mg | Freq: Once | INTRAVENOUS | Status: AC
  Administered 2023-08-31: 500 mg via INTRAVENOUS
  Filled 2023-08-31: qty 100

## 2023-08-31 MED ORDER — SODIUM BICARBONATE 8.4 % IV SOLN
INTRAVENOUS | Status: DC
  Filled 2023-08-31: qty 150

## 2023-08-31 MED ORDER — FENTANYL BOLUS VIA INFUSION: 30.0000 ug | INTRAVENOUS | Status: DC | PRN

## 2023-08-31 MED ORDER — ARFORMOTEROL TARTRATE 15 MCG/2ML IN NEBU
15.0000 ug | INHALATION_SOLUTION | Freq: Two times a day (BID) | RESPIRATORY_TRACT | Status: DC
  Administered 2023-08-31 – 2023-09-05 (×10): 15 ug via RESPIRATORY_TRACT
  Filled 2023-08-31 (×11): qty 2

## 2023-08-31 MED ORDER — LACTATED RINGERS IV BOLUS (SEPSIS)
2500.0000 mL | Freq: Once | INTRAVENOUS | Status: AC
  Administered 2023-08-31: 2500 mL via INTRAVENOUS

## 2023-08-31 MED ORDER — LACTATED RINGERS IV SOLN: INTRAVENOUS | Status: AC

## 2023-08-31 MED ORDER — INSULIN ASPART 100 UNIT/ML IJ SOLN
0.0000 [IU] | INTRAMUSCULAR | Status: DC
  Administered 2023-08-31 (×3): 4 [IU] via SUBCUTANEOUS
  Administered 2023-09-01 (×2): 7 [IU] via SUBCUTANEOUS
  Administered 2023-09-01: 11 [IU] via SUBCUTANEOUS
  Administered 2023-09-01: 3 [IU] via SUBCUTANEOUS
  Administered 2023-09-01 – 2023-09-02 (×3): 7 [IU] via SUBCUTANEOUS
  Administered 2023-09-02: 4 [IU] via SUBCUTANEOUS
  Administered 2023-09-02 (×4): 7 [IU] via SUBCUTANEOUS
  Administered 2023-09-03 – 2023-09-04 (×5): 4 [IU] via SUBCUTANEOUS

## 2023-08-31 MED ORDER — INSULIN ASPART 100 UNIT/ML IJ SOLN
0.0000 [IU] | INTRAMUSCULAR | Status: DC
  Administered 2023-08-31: 1 [IU] via SUBCUTANEOUS

## 2023-08-31 MED ORDER — FENTANYL 2500MCG IN NS 250ML (10MCG/ML) PREMIX INFUSION
0.0000 ug/h | INTRAVENOUS | Status: DC
  Administered 2023-08-31: 25 ug/h via INTRAVENOUS
  Administered 2023-09-01: 150 ug/h via INTRAVENOUS
  Filled 2023-08-31 (×2): qty 250

## 2023-08-31 MED ORDER — VANCOMYCIN HCL IN DEXTROSE 1-5 GM/200ML-% IV SOLN: 1000.0000 mg | Freq: Once | INTRAVENOUS | Status: DC

## 2023-08-31 MED ORDER — SODIUM BICARBONATE 8.4 % IV SOLN
100.0000 meq | Freq: Once | INTRAVENOUS | Status: AC
  Administered 2023-08-31: 100 meq via INTRAVENOUS
  Filled 2023-08-31: qty 50

## 2023-08-31 NOTE — Progress Notes (Signed)
 PHARMACY - PHYSICIAN COMMUNICATION CRITICAL VALUE ALERT - BLOOD CULTURE IDENTIFICATION (BCID)  Michael [name] is an 89 y.o. male who presented to [facility] on 08/30/2023 with a chief complaint of respiratory distress.  Assessment:  Started on ABX for sepsis, found to be Covid +, blood cx initially growing GNR in 1 bottle, now growing GNR and MRSE in another bottle.  Name of physician contacted: APaliwal MD  Current antibiotics: cefepime   Changes to prescribed antibiotics recommended:  No changes for now; pt still covered w/ vanc given earlier, could consider resuming if this represents a polymicrobial infection.  Results for orders placed or performed during the hospital encounter of 08/30/23  Blood Culture ID Panel (Reflexed) (Collected: 08/31/2023 12:07 AM)  Result Value Ref Range   Enterococcus faecalis NOT DETECTED NOT DETECTED   Enterococcus Faecium NOT DETECTED NOT DETECTED   Listeria monocytogenes NOT DETECTED NOT DETECTED   Staphylococcus species DETECTED (A) NOT DETECTED   Staphylococcus aureus (BCID) NOT DETECTED NOT DETECTED   Staphylococcus epidermidis DETECTED (A) NOT DETECTED   Staphylococcus lugdunensis NOT DETECTED NOT DETECTED   Streptococcus species NOT DETECTED NOT DETECTED   Streptococcus agalactiae NOT DETECTED NOT DETECTED   Streptococcus pneumoniae NOT DETECTED NOT DETECTED   Streptococcus pyogenes NOT DETECTED NOT DETECTED   A.calcoaceticus-baumannii NOT DETECTED NOT DETECTED   Bacteroides fragilis NOT DETECTED NOT DETECTED   Enterobacterales NOT DETECTED NOT DETECTED   Enterobacter cloacae complex NOT DETECTED NOT DETECTED   Escherichia coli NOT DETECTED NOT DETECTED   Klebsiella aerogenes NOT DETECTED NOT DETECTED   Klebsiella oxytoca NOT DETECTED NOT DETECTED   Klebsiella pneumoniae NOT DETECTED NOT DETECTED   Proteus species NOT DETECTED NOT DETECTED   Salmonella species NOT DETECTED NOT DETECTED   Serratia marcescens NOT DETECTED NOT DETECTED    Haemophilus influenzae NOT DETECTED NOT DETECTED   Neisseria meningitidis NOT DETECTED NOT DETECTED   Pseudomonas aeruginosa NOT DETECTED NOT DETECTED   Stenotrophomonas maltophilia NOT DETECTED NOT DETECTED   Candida albicans NOT DETECTED NOT DETECTED   Candida auris NOT DETECTED NOT DETECTED   Candida glabrata NOT DETECTED NOT DETECTED   Candida krusei NOT DETECTED NOT DETECTED   Candida parapsilosis NOT DETECTED NOT DETECTED   Candida tropicalis NOT DETECTED NOT DETECTED   Cryptococcus neoformans/gattii NOT DETECTED NOT DETECTED   Methicillin resistance mecA/C DETECTED (A) NOT DETECTED    Marvetta Dauphin, PharmD, BCPS  08/31/2023  11:24 PM

## 2023-08-31 NOTE — Sepsis Progress Note (Addendum)
 Elink monitoring for the code sepsis protocol.   23: Notified bedside nurse of need to draw 2nd lactic acid.   51: MD Osei placed orders for 3rd LA  0505: Notified bedside nurse of need to draw 3rd lactic acid.

## 2023-08-31 NOTE — Progress Notes (Signed)
 eLink Physician-Brief Progress Note Patient Name: Michael [name] DOB: 1931/01/03 MRN: [id number]   Date of Service  08/31/2023  HPI/Events of Note  Patient with known CLL and ITP on chronic steroids who presents with COVID and peripheral arterial disease.  Chronic pain.  eICU Interventions  In addition to fentanyl  drip, and fentanyl  boluses from bag.     Intervention Category Minor Interventions: Routine modifications to care plan (e.g. PRN medications for pain, fever)  Meaghen Vecchiarelli 08/31/2023, 8:57 PM

## 2023-08-31 NOTE — Progress Notes (Addendum)
 Decreased GFR noted.  PT is critically ill and high risk for PE given malignancy, COVID and chronic lymphedema  Plan Benefit outweighs risk  Proceed w/ CT angio

## 2023-08-31 NOTE — Progress Notes (Signed)
 Pharmacy Antibiotic Note  Michael [name] is a 89 y.o. male admitted on 08/30/2023 with sepsis in settng of COVID + respiratory distress > intubated, Tm 104> 102 LA6 Cr2.1  Pharmacy has been consulted for Cefepime  and vancomycin   dosing.  Plan: Vancomycin  2gm x1 in ED last pm > 750mg  IV q24hr Cefepime  2gmx1 in ED last pm > 2gm q24h   Height: 5' 7 (170.2 cm) Weight: 97.1 kg (214 lb 1.1 oz) IBW/kg (Calculated) : 66.1  Temp (24hrs), Avg:101 F (38.3 C), Min:99.6 F (37.6 C), Max:104.1 F (40.1 C)  Recent Labs  Lab 08/27/23 0944 08/31/23 0009 08/31/23 0019 08/31/23 0312  WBC 5.0 3.5*  --   --   CREATININE 1.27* 2.13*  --   --   LATICACIDVEN  --   --  4.9* 6.0*    Estimated Creatinine Clearance: 25.1 mL/min (A) (by C-G formula based on SCr of 2.13 mg/dL (H)).    No Known Allergies  Antimicrobials this admission:   Dose adjustments this admission:   Microbiology results: 8/5 BCx: ip 8/5 COVID +  8/5 MRSA PCR negative    Olam Chalk Pharm.D. CPP, BCPS Clinical Pharmacist [phone] 08/31/2023 11:49 AM

## 2023-08-31 NOTE — Progress Notes (Signed)
 Initial Nutrition Assessment  DOCUMENTATION CODES:  Not applicable  INTERVENTION:  Initiate trickle tube feeding given current sepsis and instability: Vital 1.5 at 15 ml/hr  Once clinically improving, recommend TF advancement: Titrate Vital 1.5 by 10ml q12h to goal rate of 17ml/hr ( daily) Add 60ml ProSource TF20 once daily Provides 1620 kcal, 73g protein, free water daily  Monitor magnesium  and phosphorus daily x 4 occurrences, MD to replete as needed, as pt is at risk for refeeding syndrome.  Thiamine  100mg  daily x 7 days.   NUTRITION DIAGNOSIS:  Inadequate oral intake related to acute illness as evidenced by NPO status.  GOAL:  Patient will meet greater than or equal to 90% of their needs  MONITOR:  Vent status, Labs, Weight trends, TF tolerance  REASON FOR ASSESSMENT:  Consult, Ventilator Enteral/tube feeding initiation and management  ASSESSMENT:  Pt admitted with c/o respiratory distress, found to be COVID positive and septic on admission. PMH significant for T2DM, GERD, CLL, immune related thrombocytopenia/ITP.  8/5: admitted, intubated  Patient is currently intubated on ventilator support MV: 16.5 L/min Temp (24hrs), Avg:101 F (38.3 C), Min:99.6 F (37.6 C), Max:104.1 F (40.1 C) MAP goal >65 MAP (a-line): 48-60  CT angiogram pending to r/o PE.   Spoke with pt's daughters present at bedside. He and his youngest daughter live together. She prepares meals for him 3x/d. They usually go to the grocery store on Wednesdays. Over the last ~3 months, they report that he has been eating significantly less than his baseline. His daughter will prepare meals for him and sometimes he wont eat. It has been difficult for him to consume a whole sandwich recently.   They report recurrence of his CLL and has been followed by Oncology with plans to initiate immunotherapy next week. He has chronic anemia for which he takes iron  and receives infusions d/t low  hemoglobin levels, frequency not reported.  He is generally independent with most ADL's and still drives, as recently as yesterday. He uses a cane for mobility. Despite significant lymphedema, they report this does not hinder his ability to get around. They have noticed he has had a slight increase in fatigue requiring him to hold on to walls when getting around the house.   They report that his Oncologist was concerned as he has lost 17 lbs recently stating that at his most recent appointment about 1 week ago, he weighed 200 lbs. However they report that this weight loss is d/t fluid losses secondary to his lymphedema.   Reviewed weight history which reflects that pt's highest weight was 100.3 kg on 05/22 and lowest documented weight was 91 kg on 08/01. This is a weight loss of 9.3% which is clinically significant for time frame however unable to utilize given presence of lymphedema.   OGT marking at 55cm, unable to see side port on abd xray. Reached out to RN to advance further and repeat xray. Read pending.   Drains/lines: OGT  A-line  Medications: colace BID, solu-cortef , SSI 0-9 units q4h, miralax  daily Drips: LR @ 150ml/hr Levo @ 28mcg/min Vaso @ 0.04units/min  Labs:  BUN 30 Cr 2.13 Ionized calcium  1.10 Magnesium  1.3 GFR 29 Lactic acid 6.0 CBG's 127, 141 x 3 hours  NUTRITION - FOCUSED PHYSICAL EXAM: Flowsheet Row Most Recent Value  Orbital Region No depletion  Upper Arm Region Unable to assess  [mild pitting edema]  Thoracic and Lumbar Region Moderate depletion  Buccal Region Unable to [employer] Region No depletion  Clavicle  Bone Region No depletion  Clavicle and Acromion Bone Region No depletion  Scapular Bone Region No depletion  Dorsal Hand Unable to assess  [moderate edema non-pitting]  Patellar Region Unable to assess  Anterior Thigh Region Unable to assess  [severe pitting edema]  Posterior Calf Region Unable to assess  [chronic lymphedema, blistering]   Edema (RD Assessment) Severe  Hair Reviewed  Eyes Other (Comment)  [white spots on outer eye]  Mouth Unable to assess  Skin Reviewed  Nails Reviewed    Diet Order:   Diet Order             Diet NPO time specified  Diet effective now                   EDUCATION NEEDS:  No education needs have been identified at this time  Skin:  Skin Assessment: Reviewed RN Assessment  Last BM:  unknown/PTA  Height:  Ht Readings from Last 1 Encounters:  08/31/23 5' 7 (1.702 m)    Weight:  Wt Readings from Last 1 Encounters:  08/31/23 97.1 kg    Ideal Body Weight:  67.3 kg  BMI:  Body mass index is 33.53 kg/m.  Estimated Nutritional Needs:   Kcal:  1600-1800  Protein:  80-95g  Fluid:  >/=1.6L  Allie Harlow Carrizales, RDN, LDN Clinical Nutrition See AMiON for contact information.

## 2023-08-31 NOTE — Procedures (Signed)
 Arterial Catheter Insertion Procedure Note  Michael [name]  [id number]  11/20/1930  Date:08/31/23  Time:12:07 PM    Provider Performing: Jeralyn FORBES Banner    Procedure: Insertion of Arterial Line (63379) with US  guidance (23062)   Indication(s) Blood pressure monitoring and/or need for frequent ABGs  Consent Risks of the procedure as well as the alternatives and risks of each were explained to the patient and/or caregiver.  Consent for the procedure was obtained and is signed in the bedside chart  Anesthesia None   Time Out Verified patient identification, verified procedure, site/side was marked, verified correct patient position, special equipment/implants available, medications/allergies/relevant history reviewed, required imaging and test results available.   Sterile Technique Maximal sterile technique including full sterile barrier drape, hand hygiene, sterile gown, sterile gloves, mask, hair covering, sterile ultrasound probe cover (if used).   Procedure Description Area of catheter insertion was cleaned with chlorhexidine  and draped in sterile fashion. With real-time ultrasound guidance an arterial catheter was placed into the right axillary artery.  Appropriate arterial tracings confirmed on monitor.     Complications/Tolerance None; patient tolerated the procedure well.   EBL Minimal   Specimen(s) None

## 2023-08-31 NOTE — Procedures (Signed)
 Central Venous Catheter Insertion Procedure Note  Michael [name]  [id number]  Dec 25, 1930  Date:08/31/23  Time:12:06 PM   Provider Performing:Pete FORBES Jenna   Procedure: Insertion of Non-tunneled Central Venous [phone]) with US  guidance (23062)   Indication(s) Difficult access  Consent Risks of the procedure as well as the alternatives and risks of each were explained to the patient and/or caregiver.  Consent for the procedure was obtained and is signed in the bedside chart  Anesthesia Topical only with 1% lidocaine    Timeout Verified patient identification, verified procedure, site/side was marked, verified correct patient position, special equipment/implants available, medications/allergies/relevant history reviewed, required imaging and test results available.  Sterile Technique Maximal sterile technique including full sterile barrier drape, hand hygiene, sterile gown, sterile gloves, mask, hair covering, sterile ultrasound probe cover (if used).  Procedure Description Area of catheter insertion was cleaned with chlorhexidine  and draped in sterile fashion.  With real-time ultrasound guidance a central venous catheter was placed into the right internal jugular vein. Nonpulsatile blood flow and easy flushing noted in all ports.  The catheter was sutured in place and sterile dressing applied.  Complications/Tolerance None; patient tolerated the procedure well. Chest X-ray is ordered to verify placement for internal jugular or subclavian cannulation.   Chest x-ray is not ordered for femoral cannulation.  EBL Minimal  Specimen(s) None

## 2023-08-31 NOTE — Progress Notes (Signed)
 LA not fully clearing. CT scan does not show an uncontrolled sepsis source; physical exam does not fully explain this. Got an hour-long albuterol  neb today- possibly contributing.  Recheck LA in AM.  Michael SHAUNNA Gaskins, DO 08/31/23 7:43 PM [facility]

## 2023-08-31 NOTE — Progress Notes (Signed)
 Notified of possible ETT displacement in CT-- able to be advanced shortly after by RT with symmetric bilateral breath sounds and capturing volumes on the vent. Possible audible air leak from cuff, but still capturing volumes. Given sedation boluses with fentanyl  and versed  before transport back upstairs.  At bedside no audible leak. Has Ppeak 29. Capturing volumes appropriately.  Getting CXR to confirm appropriate placement now.  CAT now, start brovana  and yupelri  after. Duonebs PRN.   Leita SHAUNNA Gaskins, DO 08/31/23 2:01 PM [facility]  For contact information, see Amion. If no response to pager, please call PCCM consult pager. After hours, 7PM- 7AM, please call Elink.

## 2023-08-31 NOTE — Progress Notes (Signed)
 NAME:  Michael [name], MRN:  [id number], DOB:  06-23-1930, LOS: 0 ADMISSION DATE:  08/30/2023 CONSULTATION DATE:  08/31/2023 REFERRING MD:  Raford - EDP, CHIEF COMPLAINT:  Respiratory distress   History of Present Illness:  89 year old man who presented to [facility] ED 8/4 with respiratory distress. PMHx significant for T2DM, GERD, CLL (diagnosed 2010 with LAD/splenomegaly), immune-related thrombocytopenia/ITP.  Patient was brought to ED for respiratory distress. Per family, became suddenly unresponsive at home while sitting at the table. On EMS arrival, +spontaneous respirations but unresponsive and hypotensive with SBP 60s. NS 500mL given with improvement in SBP to 100s. Required BVM ventilations with EMS and was intubated on ED arrival. In ED, patient was febrile to 104.1, HR 109, BP 150/108, RR 19. SpO2 97%. Labs were notable for WBC 3.5, Hgb 7.9 (baseline ~9-10), Plt 40 (baseline ~60s). INR 1.3. Na 136, K 3.7, CO2 24, BUN/Cr 30/2.13 (baseline Cr. 1-1.3). Mg 1.3. LFTs WNL. BNP 464, trop 25. LA 4.9 > 6.0. ABG 7.123/80.8/161/25.8. UA +small gluc/Hgb/prot. COVID positive. CXR unremarkable. CT Head Negative for acute abnormality, +mucosal thickening of sinuses/mastoid effusions and L middle ear opacification. Broad-spectrum antibiotics started.  PCCM consulted for ICU admission.  History is obtained from patient's daughters (at bedside). They report he has had increased fatigue for several months, followed by poor PO intake/lack of appetite for ~2 weeks. Recently had chills at home (unknown if febrile) and respiratory symptoms such as cough and congestion. No n/v/d or changes in bowel or urinary habits to their knowledge. At baseline, patient is reportedly independent at home, lives with his daughter who provides minimal assistance (patient still drives short distances). We briefly discussed code status and patient's daughters confirmed that he would want everything including compressions if  necessary.  Pertinent Medical History:   Past Medical History:  Diagnosis Date   Anemia    Anginal pain (HCC)    upon exertion   Arthritis    left shoulder, lower back (10/20/2016)   CLL (chronic lymphoblastic leukemia) dx'd 2000   Edema leg    Family history of adverse reaction to anesthesia    daughter's BP drops    GERD (gastroesophageal reflux disease)    History of hiatal hernia    Leukemia, chronic lymphoid (HCC) 12/08/2010   Lymphocytosis    Type II diabetes mellitus (HCC)    Significant Hospital Events: Including procedures, antibiotic start and stop dates in addition to other pertinent events   8/5 - Presented to [facility] ED via EMS for respiratory distress/unresponsiveness at home; required BVM ventilation with EMS. Intubated on ED arrival. Febrile in ED to 104F. COVID+. PCCM consulted for admission. After xfer to ICU remained on high dose pressors, POCUS hyperdynamic. IVC not collapsing. Getting CT angiogram to r/o PE. Broad spec abx continued. Added stress dose steroids   Interim History / Subjective:  .sedated on vent high pressor needs   Objective:  Blood pressure (!) 122/97, pulse (!) 116, temperature (!) 100.4 F (38 C), resp. rate (!) 35, height 5' 7 (1.702 m), weight 97.1 kg, SpO2 97%.    FiO2 (%):  [40 %-60 %] 60 % Set Rate:  [15 bmp-24 bmp] 24 bmp Vt Set:  [500 mL] 500 mL PEEP:  [5 cmH20-8 cmH20] 8 cmH20 Plateau Pressure:  [22 cmH20] 22 cmH20   Intake/Output Summary (Last 24 hours) at 08/31/2023 0740 Last data filed at 08/31/2023 0700 Gross per 24 hour  Intake 5710.09 ml  Output 325 ml  Net 5385.09 ml   [employer]  08/31/23 0003 08/31/23 0500  Weight: 91 kg 97.1 kg   Physical Examination: General this is a 89 year old male who is sedated on fent gtt HENT pupils small equally reactive orally intubated Pulm diffuse rhonchi. RR 35 (rate set now at 30) PIP 22 Card tachy RRR. POCUS. Hyperdynamic. IVC w/out resp variation Abd soft hypoactive  Ext  warm chronic venous lymphedema  Neuro sedated + cough grimaced to pain  Gu dec'd [name] Problem List:    Assessment & Plan:  Acute hypoxic and hypercarbic respiratory failure 2/2  COVID-19 infection -initial CXR neg., BNP mid 400s.  Plan Cont full vent support Adjust Ve for hypercarbia and repeat CXR Add BDs PAD protocol RASS goal -1 Repeat CXR today and in am  F/u resp culture as well as urine antigens VAP bundle  Cont cefepime  and vanc empirically Given shock state, Covid infection w/ hypoxia and concern for evolving PNA (perhaps just not showing on CXR currently) will add systemic steroids.  Daily assessment for weaning  Also obtaining CT angio (given CLL, covid, LE lymph edema) high risk and could also explain respiratory failure and shock state   Septic shock w/ lactic acidosis.  Echo in June this year w/ nml EF and wall motion Received 4.5 liters crystalloid  Plan Titrate norepi and vasopressin  w/ goal MAP >65 Added stress dose steroids Trend procal Abx as above F/u pending cultures Repeat lactic acid  Will need CVL for CVP monitoring as well as obtaining SCVO2  AKI, likely ATN in the setting of sepsis/hypotension Now s/p volume resuscitation Plan MAP goal > 65 Foley for strict I&O Renal dose meds Serial chems  Acute metabolic encephalopathy 2/2 sepsis and hypercarbia  Plan Supportive care  Fluid and electrolyte imbalance: Hypocalcemia Plan Give calcium  gluconate today  Serial chems   T2DM w/ hyperglycemia->anticipate will get worse w/ steroids Plan SSI goal 140-180 Will likely have to adjust sensitivity   GERD Plan Cont PPI  CLL (diagnosed 2010 with LAD/splenomegaly) Immune-related thrombocytopenia/ITP Plan Trend CBC (WBC, Plt) Monitor for signs of active bleeding Transfuse for Plt < 20K or hemodynamically significant bleeding Outpatient follow up per Oncology protocol  Chronic BLE lymphedema Plan OT/PT consult  Best  Practice: (right click and Reselect all SmartList Selections daily)   Diet/type: NPO DVT prophylaxis: SCDs - thrombocytopenic GI prophylaxis: PPI Lines: N/A Foley:  Yes, and it is still needed Code Status:  full code Last date of multidisciplinary goals of care discussion [Pending]    Critical care time: 32 min

## 2023-08-31 NOTE — H&P (Signed)
 NAME:  Michael [name], MRN:  [id number], DOB:  10-20-1930, LOS: 0 ADMISSION DATE:  08/30/2023 CONSULTATION DATE:  08/31/2023 REFERRING MD:  Raford - EDP, CHIEF COMPLAINT:  Respiratory distress   History of Present Illness:  89 year old man who presented to [facility] ED 8/4 with respiratory distress. PMHx significant for T2DM, GERD, CLL (diagnosed 2010 with LAD/splenomegaly), immune-related thrombocytopenia/ITP.  Patient was brought to ED for respiratory distress. Per family, became suddenly unresponsive at home while sitting at the table. On EMS arrival, +spontaneous respirations but unresponsive and hypotensive with SBP 60s. NS 500mL given with improvement in SBP to 100s. Required BVM ventilations with EMS and was intubated on ED arrival. In ED, patient was febrile to 104.1, HR 109, BP 150/108, RR 19. SpO2 97%. Labs were notable for WBC 3.5, Hjgb 7.9 (baseline ~9-10), Plt 40 (baseline ~60s). INR 1.3. Na 136, K 3.7, CO2 24, BUN/Cr 30/2.13 (baseline Cr. 1-1.3). Mg 1.3. LFTs WNL. BNP 464, trop 25. LA 4.9 > 6.0. ABG 7.123/80.8/161/25.8. UA +small gluc/Hgb/prot. COVID positive. CXR unremarkable. CT Head NAICA, +mucosal thickening of sinuses/mastoid effusions and L middle ear opacification. Broad-spectrum antibiotics started.  PCCM consulted for ICU admission.  History is obtained from patient's daughters (at bedside). They report he has had increased fatigue for several months, followed by poor PO intake/lack of appetite for ~2 weeks. Recently had chills at home (unknown if febrile) and respiratory symptoms such as cough and congestion. No n/v/d or changes in bowel or urinary habits to their knowledge. At baseline, patient is reportedly independent at home, lives with his daughter who provides minimal assistance (patient still drives short distances). We briefly discussed code status and patient's daughters confirmed that he would want everything including compressions if necessary.  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 Intubated on ED arrival. Febrile in ED to 104F. COVID+. PCCM consulted for admission.  Interim History / Subjective:  PCCM consulted for ICU admission.  Objective:  Blood pressure (!) 117/40, pulse (!) 110, temperature 100.3 F (37.9 C), resp. rate (!) 25, height 5' 7 (1.702 m), weight 91 kg, SpO2 92%.    FiO2 (%):  [40 %] 40 % Set Rate:  [15 bmp-24 bmp] 24 bmp Vt Set:  [500 mL] 500 mL PEEP:  [5 cmH20] 5 cmH20 Plateau Pressure:  [22 cmH20] 22 cmH20   Intake/Output Summary (Last 24 hours) at 08/31/2023 0355 Last data filed at 08/31/2023 9682 Gross per 24 hour  Intake 2520.87 ml  Output --  Net 2520.87 ml   Filed Weights   08/31/23 0003  Weight: 91 kg   Physical Examination: General: Acutely ill-appearing elderly man in NAD. HEENT: [place]/AT, anicteric sclera, PERRL 3mm sluggish, moist mucous membranes. Copious oral secretions. Neuro: Unresponsive, mildly sedated. Does not respond  to verbal, tactile or noxious stimuli. Does not withdraw to pain. Not following commands. No spontaneous movement of extremities noted on my exam.+Corneal, +Cough, and +Gag  CV: Mildly tachycardic, regular rhythm, no m/g/r. PULM: Breathing mildly labored and somewhat paradoxical with belly breathing on vent (PEEP 8,  FiO2 60%). Lung fields with faint expiratory wheeze, poor air movement at bilateral bases. GI: Soft, nontender, nondistended. Normoactive bowel sounds. Extremities: Bilateral chronic-appearing 2-3+ pitting woody lymphedema noted, R > L. Skin: Warm/dry, lymphedema as described above, no other rashes.  Resolved Hospital Problem List:    Assessment & Plan:  Acute ventilator-dependent respiratory failure in the setting of COVID-19 infection COVID-19 infection - Admit to ICU for close monitoring - Continue full vent support (4-8cc/kg IBW) - Wean FiO2 for O2 sat > 90% - Daily WUA/SBT once appropriate from a ventilator requirements standpoint - VAP bundle - Consider steroids - Bronchodilators PRN - Pulmonary hygiene - PAD protocol for sedation: Fentanyl  for goal RASS 0 to -1 - Follow ABG, CXR  Concern for septic shock in the setting of the above Lactic acidosis - Goal MAP > 65 - Fluid resuscitation as tolerated, s/p LR x 4.5L total - Levophed  titrated to goal MAP, consider adding vaso - Trend WBC, fever curve - F/u Cx data - Continue broad-spectrum antibiotics (cefepime , Flagyl , vanc)  AKI, likely ATN in the setting of sepsis/hypotension - Trend BMP - Replete electrolytes as indicated - Monitor I&Os - Avoid nephrotoxic agents as able - Ensure adequate renal perfusion  T2DM - SSI - CBGs Q4H - Goal CBG 140-180  GERD - PPI  CLL (diagnosed 2010 with LAD/splenomegaly) Immune-related thrombocytopenia/ITP - Trend CBC (WBC, Plt) - Monitor for signs of active bleeding - Transfuse for Plt < 20K or hemodynamically significant bleeding - Outpatient follow up per Oncology protocol  Chronic BLE lymphedema - OT/PT consult  Best Practice: (right click and Reselect all SmartList Selections daily)   Diet/type: NPO DVT prophylaxis: SCDs - thrombocytopenic GI prophylaxis: PPI Lines: N/A Foley:  Yes, and it is still needed Code Status:  full code Last date of multidisciplinary  goals of care discussion [Pending]  Labs:  CBC: Recent Labs  Lab 08/27/23 0944 08/31/23 0009 08/31/23 0044 08/31/23 0121  WBC 5.0 3.5*  --   --   NEUTROABS 2.7 0.5*  --   --   HGB 9.5* 7.9* 8.2* 8.8*  HCT 28.4* 25.5* 24.0* 26.0*  MCV 94.7 99.2  --   --   PLT 51* 40*  --   --    Basic Metabolic Panel: Recent Labs  Lab 08/27/23 0944 08/31/23 0009 08/31/23 0044 08/31/23 0121  NA 142 136 138 138  K 3.4* 3.7 3.5 3.6  CL 103 101  --   --   CO2 31 24  --   --   GLUCOSE 169* 193*  --   --   BUN 24* 30*  --   --   CREATININE 1.27* 2.13*  --   --   CALCIUM  8.1* 7.4*  --   --   MG  --  1.3*  --   --    GFR: Estimated Creatinine Clearance: 24.3 mL/min (A) (by C-G formula based on SCr of 2.13 mg/dL (H)). Recent Labs  Lab 08/27/23 0944 08/31/23 0009 08/31/23 0019 08/31/23 0312  WBC 5.0 3.5*  --   --   LATICACIDVEN  --   --  4.9* 6.0*   Liver Function Tests: Recent Labs  Lab 08/27/23 0944 08/31/23 0009  AST  11* 33  ALT 7 16  ALKPHOS 44 38  BILITOT 0.6 1.1  PROT 6.3* 5.1*  ALBUMIN 3.2* 2.0*   No results for input(s): LIPASE, AMYLASE in the last 168 hours. No results for input(s): AMMONIA in the last 168 hours.  ABG:    Component Value Date/Time   PHART 7.123 (LL) 08/31/2023 0121   PCO2ART 80.8 (HH) 08/31/2023 0121   PO2ART 161 (H) 08/31/2023 0121   HCO3 25.8 08/31/2023 0121   TCO2 28 08/31/2023 0121   ACIDBASEDEF 4.0 (H) 08/31/2023 0121   O2SAT 98 08/31/2023 0121   Coagulation Profile: Recent Labs  Lab 08/31/23 0009  INR 1.3*   Cardiac Enzymes: No results for input(s): CKTOTAL, CKMB, CKMBINDEX, TROPONINI in the last 168 hours.  HbA1C: Hgb A1c MFr Bld  Date/Time Value Ref Range Status  06/02/2022 12:17 PM 9.0 (H) 4.8 - 5.6 % Final    Comment:    (NOTE) Pre diabetes:          5.7%-6.4%  Diabetes:              >6.4%  Glycemic control for   <7.0% adults with diabetes   02/19/2022 06:20 AM 6.5 (H) 4.8 - 5.6 % Final    Comment:     (NOTE) Pre diabetes:          5.7%-6.4%  Diabetes:              >6.4%  Glycemic control for   <7.0% adults with diabetes    CBG: No results for input(s): GLUCAP in the last 168 hours.  Review of Systems:   Patient is encephalopathic and/or intubated; therefore, history has been obtained from chart review.   Past Medical History:  He,  has a past medical history of Anemia, Anginal pain (HCC), Arthritis, CLL (chronic lymphoblastic leukemia) (dx'd 2000), Edema leg, Family history of adverse reaction to anesthesia, GERD (gastroesophageal reflux disease), History of hiatal hernia, Leukemia, chronic lymphoid (HCC) (12/08/2010), Lymphocytosis, and Type II diabetes mellitus (HCC).   Surgical History:   Past Surgical History:  Procedure Laterality Date   BOWEL RESECTION N/A 11/15/2017   Procedure: SMALL BOWEL RESECTION;  Surgeon: Vernetta Berg, MD;  Location: [facility] OR;  Service: General;  Laterality: N/A;   CATARACT EXTRACTION W/ INTRAOCULAR LENS  IMPLANT, BILATERAL Bilateral    EYE SURGERY     Cataracts (bilateral)   GROIN DISSECTION Left 11/15/2017   Procedure: GROIN EXPLORATION WITH REMOVAL OF INGUINAL HERNIA MESH;  Surgeon: Vernetta Berg, MD;  Location: [facility] OR;  Service: General;  Laterality: Left;   INGUINAL HERNIA REPAIR Left 11/10/2017   Procedure: LEFT INGUINAL HERNIA REPAIR ERAS PATHWAY;  Surgeon: Vernetta Berg, MD;  Location: [facility] OR;  Service: General;  Laterality: Left;   INGUINAL HERNIA REPAIR Left 11/15/2017   Procedure: LEFT INGUINAL HERNIA REPAIR WITH MESH;  Surgeon: Vernetta Berg, MD;  Location: [facility] OR;  Service: General;  Laterality: Left;   INSERTION OF MESH Left 11/10/2017   Procedure: INSERTION OF MESH;  Surgeon: Vernetta Berg, MD;  Location: [facility] OR;  Service: General;  Laterality: Left;   Social History:   reports that he has never smoked. He quit smokeless tobacco use about 8 years ago.  His smokeless tobacco use included snuff. He reports that he does  not currently use alcohol. He reports that he does not use drugs.   Family History:  His family history includes Cancer in his mother; Diabetes in his father; Hypertension in his father.   Allergies: No Known  Allergies   Home Medications: Prior to Admission medications   Medication Sig Start Date End Date Taking? Authorizing Provider  acetaminophen  (TYLENOL ) 650 MG CR tablet Take 1 tablet (650 mg total) by mouth 2 (two) times daily as needed for pain. Patient taking differently: Take 650 mg by mouth 2 (two) times daily. 12/22/17  Yes Jerri Keys, MD  acyclovir  (ZOVIRAX ) 400 MG tablet Take 1 tablet (400 mg total) by mouth daily. 06/17/23  Yes Gorsuch, Ni, MD  Cholecalciferol  (VITAMIN D3) 1000 units CAPS Take 1,000 Units by mouth daily.   Yes [provider]  Cyanocobalamin  (VITAMIN B 12 PO) Take 1,000 mcg by mouth daily.   Yes [provider]  insulin  NPH-regular Human (70-30) 100 UNIT/ML injection Inject 35 Units into the skin 2 (two) times daily.   Yes [provider]  morphine  (MSIR) 30 MG tablet Take 15 mg by mouth daily as needed for moderate pain (pain score 4-6). 08/13/23  Yes [provider]  pantoprazole  (PROTONIX ) 40 MG tablet Take 40 mg by mouth daily. 10/22/19  Yes [provider]  polyethylene glycol (MIRALAX  / GLYCOLAX ) packet Take 17 g by mouth daily. Patient taking differently: Take 17 g by mouth daily as needed for mild constipation. 12/23/17  Yes Jerri Keys, MD  predniSONE  (DELTASONE ) 20 MG tablet Take 1 tablet (20 mg total) by mouth daily with breakfast. 08/27/23  Yes Gorsuch, Ni, MD  SV IRON  325 (65 Fe) MG tablet Take 325 mg by mouth daily. 07/01/23  Yes [provider]  tamsulosin  (FLOMAX ) 0.4 MG CAPS capsule Take 0.4 mg by mouth daily.   Yes [provider]  torsemide  (DEMADEX ) 20 MG tablet Take 20 mg by mouth daily. 12/11/19  Yes [provider]  ipratropium (ATROVENT) 0.06 % nasal spray Place 1 spray into both  nostrils daily.    [provider]  insulin  aspart protamine-insulin  aspart (NOVOLOG  70/30) (70-30) 100 UNIT/ML injection Inject 35 Units into the skin 2 (two) times daily with a meal.   04/07/11  [provider]    Critical care time:   The patient is critically ill with multiple organ system failure and requires high complexity decision making for assessment and support, frequent evaluation and titration of therapies, advanced monitoring, review of radiographic studies and interpretation of complex data.   Critical Care Time devoted to patient care services, exclusive of separately billable procedures, described in this note is 40 minutes.  Corean CHRISTELLA Cedrick Partain, PA-C [facility] 08/31/23 3:55 AM  Please see [email or web address] for pager details.  From 7A-7P if no response, please call [phone] After hours, please call ELink [phone]

## 2023-08-31 NOTE — Progress Notes (Addendum)
 eLink Physician-Brief Progress Note Patient Name: Dearies Meikle DOB: May 17, 1930 MRN: [id number]   Date of Service  08/31/2023  HPI/Events of Note  89 year old male who presented to the emergency department with shortness of breath.  Found to be febrile to 104 F.  COVID-positive.  Intubated in ED for respiratory distress.  Chest x-ray surprisingly clear.  ABG reviewed significant hypercapnia.  Hemodynamically unstable requiring pressors.  eICU Interventions  Patient's chart reviewed.  Pertinent labs and imaging studies reviewed.  Video assessment of patient done to the best of my ability. Impression: Severe sepsis with shock, suspect gram-negative infection. COVID-19 positivity. Hypercapnic respiratory failure currently on invasive mechanical ventilation. Acute stage III CKD Chronic thrombocytopenia Would rule out VTE/PE considering the fact that he is COVID-positive, hypoxemic with clear lungs on chest imaging and hypotensive.  Unfortunately, he cannot be anticoagulated on account of thrombocytopenia.     Intervention Category Evaluation Type: New Patient Evaluation  Jerilynn Berg 08/31/2023, 5:46 AM  Addendum: ABG with improvement in respiratory component of acidosis but worsening metabolic acidosis.  Lactic acid level is 6.0.  Will give bicarb pushes and start patient on bicarb infusion.  Orders placed.  6:35 AM.

## 2023-08-31 NOTE — ED Notes (Signed)
 Pt BP in the 80s systolic, Raford MD notified. New orders placed.

## 2023-08-31 NOTE — Progress Notes (Signed)
 GNR - 2 of 4 aerobic, nothing detected on BCID  PHARMACY - PHYSICIAN COMMUNICATION CRITICAL VALUE ALERT - BLOOD CULTURE IDENTIFICATION (BCID)  Michael [name] is an 89 y.o. male who presented to [facility] on 08/30/2023 with a chief complaint of respiratory distress  Assessment: 26 YOM with hx CLL who presented with unresponsiveness/respiratory distress, found to have COVID but also concern for sepsis. Possible sources include PNA vs sinusitis/OM/mastoiditis seen on head CT. Now with 2 of 4 blood culture bottles (aerobic) growing GNR with BCID not detecting anything.  Name of physician (or Provider) Contacted: CCM (via rounding pharmacists on the unit)  Current antibiotics: Vancomycin  + Cefepime   Changes to prescribed antibiotics recommended:  Narrow to Cefepime  monotherapy for now - pending speciation of the organism  Results for orders placed or performed during the hospital encounter of 08/30/23  Blood Culture ID Panel (Reflexed) (Collected: 08/31/2023 12:23 AM)  Result Value Ref Range   Enterococcus faecalis NOT DETECTED NOT DETECTED   Enterococcus Faecium NOT DETECTED NOT DETECTED   Listeria monocytogenes NOT DETECTED NOT DETECTED   Staphylococcus species NOT DETECTED NOT DETECTED   Staphylococcus aureus (BCID) NOT DETECTED NOT DETECTED   Staphylococcus epidermidis NOT DETECTED NOT DETECTED   Staphylococcus lugdunensis NOT DETECTED NOT DETECTED   Streptococcus species NOT DETECTED NOT DETECTED   Streptococcus agalactiae NOT DETECTED NOT DETECTED   Streptococcus pneumoniae NOT DETECTED NOT DETECTED   Streptococcus pyogenes NOT DETECTED NOT DETECTED   A.calcoaceticus-baumannii NOT DETECTED NOT DETECTED   Bacteroides fragilis NOT DETECTED NOT DETECTED   Enterobacterales NOT DETECTED NOT DETECTED   Enterobacter cloacae complex NOT DETECTED NOT DETECTED   Escherichia coli NOT DETECTED NOT DETECTED   Klebsiella aerogenes NOT DETECTED NOT DETECTED   Klebsiella oxytoca NOT DETECTED NOT  DETECTED   Klebsiella pneumoniae NOT DETECTED NOT DETECTED   Proteus species NOT DETECTED NOT DETECTED   Salmonella species NOT DETECTED NOT DETECTED   Serratia marcescens NOT DETECTED NOT DETECTED   Haemophilus influenzae NOT DETECTED NOT DETECTED   Neisseria meningitidis NOT DETECTED NOT DETECTED   Pseudomonas aeruginosa NOT DETECTED NOT DETECTED   Stenotrophomonas maltophilia NOT DETECTED NOT DETECTED   Candida albicans NOT DETECTED NOT DETECTED   Candida auris NOT DETECTED NOT DETECTED   Candida glabrata NOT DETECTED NOT DETECTED   Candida krusei NOT DETECTED NOT DETECTED   Candida parapsilosis NOT DETECTED NOT DETECTED   Candida tropicalis NOT DETECTED NOT DETECTED   Cryptococcus neoformans/gattii NOT DETECTED NOT DETECTED    Thank you for allowing pharmacy to be a part of this patient's care.  Almarie Lunger, PharmD, BCPS, BCIDP Infectious Diseases Clinical Pharmacist 08/31/2023 2:42 PM   **Pharmacist phone directory can now be found on [email or web address] ([email or web address]).  Listed under [facility].

## 2023-08-31 NOTE — ED Provider Notes (Signed)
 [place] EMERGENCY DEPARTMENT AT [facility] Provider Note   CSN: [id number] Arrival date & time: 08/30/23  2348     Patient presents with: Respiratory Distress   Michael [name] is a 89 y.o. male.   The history is provided by the EMS personnel. The history is limited by the condition of the patient (Patient unresponsive).   He has history of diabetes, chronic lymphocytic leukemia, immune thrombocytopenic purpura and was brought in by EMS because of loss of consciousness.  EMS reports family states that he was at home at the table and suddenly became unresponsive.  EMS noted spontaneous breathing but patient unresponsive, initial blood pressure 60/30.  He was given 500 mL of normal saline and blood pressure rose to 101/54.  EMS noted some decreased respirations and were assisting respirations by bag/valve/mask.  They were not getting consistent pulse oximetry readings.    Prior to Admission medications   Medication Sig Start Date End Date Taking? Authorizing Provider  acetaminophen  (TYLENOL ) 650 MG CR tablet Take 1 tablet (650 mg total) by mouth 2 (two) times daily as needed for pain. Patient taking differently: Take 650 mg by mouth 2 (two) times daily. 12/22/17   Jerri Keys, MD  acyclovir  (ZOVIRAX ) 400 MG tablet Take 1 tablet (400 mg total) by mouth daily. Patient not taking: Reported on 06/23/2023 06/17/23   Lonn Hicks, MD  Cholecalciferol  (VITAMIN D3) 1000 units CAPS Take 1,000 Units by mouth daily.    [provider]  Cyanocobalamin  (VITAMIN B 12 PO) Take 1,000 mcg by mouth daily.    [provider]  insulin  NPH-regular Human (70-30) 100 UNIT/ML injection Inject 35 Units into the skin 2 (two) times daily.    [provider]  mirtazapine  (REMERON ) 15 MG tablet Take 1 tablet (15 mg total) by mouth at bedtime. 12/22/17   Jerri Keys, MD  morphine  (MSIR) 15 MG tablet Take 15 mg by mouth 2 (two) times daily. 01/13/22   [provider]   pantoprazole  (PROTONIX ) 40 MG tablet Take 40 mg by mouth daily. 10/22/19   [provider]  polyethylene glycol (MIRALAX  / GLYCOLAX ) packet Take 17 g by mouth daily. 12/23/17   Jerri Keys, MD  predniSONE  (DELTASONE ) 20 MG tablet Take 1 tablet (20 mg total) by mouth daily with breakfast. 08/27/23   Lonn Hicks, MD  tamsulosin  (FLOMAX ) 0.4 MG CAPS capsule Take 0.4 mg by mouth daily.    [provider]  torsemide  (DEMADEX ) 20 MG tablet Take 20 mg by mouth daily. Patient not taking: Reported on 06/23/2023 12/11/19   [provider]  insulin  aspart protamine-insulin  aspart (NOVOLOG  70/30) (70-30) 100 UNIT/ML injection Inject 35 Units into the skin 2 (two) times daily with a meal.   04/07/11  [provider]    Allergies: Patient has no known allergies.    Review of Systems  Unable to perform ROS: Patient unresponsive    Updated Vital Signs BP (!) 150/108   Pulse (!) 109   Temp (!) 104.1 F (40.1 C) (Rectal)   Resp 19   Ht 5' 7 (1.702 m)   Wt 91 kg   SpO2 97%   BMI 31.42 kg/m   Physical Exam Vitals and nursing note reviewed.   89 year old male, resting comfortably and in no acute distress. Vital signs are significant for elevated temperature, heart rate, blood pressure. Oxygen saturation is 97%, which is normal. Head is normocephalic and atraumatic.  Pupils are 2-3 mm and minimally reactive.  Corneal  reflexes present. Neck has JVD noted when laying supine. Back has no presacral edema. Lungs have symmetric air movement without overt rales or wheezes or rhonchi. Chest moves symmetrically. Heart has regular rate and rhythm without murmur. Abdomen is soft, flat. Extremities have 3+ brawny edema. Skin is warm and dry without rash. Neurologic: Unresponsive to painful stimuli, generally tremulous, absent gag reflex, no spontaneous movement.  (all labs ordered are listed, but only abnormal results are displayed) Labs Reviewed  RESP PANEL BY RT-PCR (RSV,  FLU A&B, COVID)  [id number]  CULTURE, BLOOD (ROUTINE X 2)  CULTURE, BLOOD (ROUTINE X 2)  COMPREHENSIVE METABOLIC PANEL WITH GFR  CBC WITH DIFFERENTIAL/PLATELET  PROTIME-INR  URINALYSIS, W/ REFLEX TO CULTURE (INFECTION SUSPECTED)  BRAIN NATRIURETIC PEPTIDE  MAGNESIUM   I-STAT CG4 LACTIC ACID, ED  TROPONIN I (HIGH SENSITIVITY)    EKG: EKG Interpretation Date/Time:  Monday August 30 2023 23:53:30 EDT Ventricular Rate:  126 PR Interval:  168 QRS Duration:  84 QT Interval:  347 QTC Calculation: 476 R Axis:   73  Text Interpretation: Sinus tachycardia Probable left atrial enlargement RSR' in V1 or V2, probably normal variant Borderline prolonged QT interval Artifact in lead(s) I II III aVR aVL V1 V4 V5 LOW VOLTAGE When compared with ECG of 06/11/2023, HEART RATE has increased Confirmed by Raford Lenis ([id number]) on 08/31/2023 12:14:47 AM  Radiology: No results found.   Procedure Name: Intubation Date/Time: 08/31/2023 12:25 AM  Performed by: Raford Lenis, MDPre-anesthesia Checklist: Patient identified, Patient being monitored, Emergency Drugs available, Timeout performed and Suction available Oxygen Delivery Method: Non-rebreather mask Preoxygenation: Pre-oxygenation with 100% oxygen Induction Type: Rapid sequence Ventilation: Two handed mask ventilation required Laryngoscope Size: Glidescope and 3 Grade View: Grade I Tube size: 7.5 mm Number of attempts: 1 Airway Equipment and Method: Rigid stylet and Video-laryngoscopy Placement Confirmation: ETT inserted through vocal cords under direct vision, CO2 detector and Breath sounds checked- equal and bilateral Secured at: 24 cm Tube secured with: ETT holder Dental Injury: Teeth and Oropharynx as per pre-operative assessment        Medications Ordered in the ED  lactated ringers  infusion (has no administration in time range)  lactated ringers  bolus 2,500 mL (has no administration in time range)  ceFEPIme  (MAXIPIME ) 2 g in sodium chloride   0.9 % 100 mL IVPB (has no administration in time range)  metroNIDAZOLE  (FLAGYL ) IVPB 500 mg (has no administration in time range)  vancomycin  (VANCOCIN ) IVPB 1000 mg/200 mL premix (has no administration in time range)  etomidate  (AMIDATE ) injection (20 mg Intravenous Given 08/30/23 2356)  rocuronium  (ZEMURON ) injection (100 mg Intravenous Given 08/30/23 2357)                                    Medical Decision Making Amount and/or Complexity of Data Reviewed Labs: ordered. Radiology: ordered.  Risk OTC drugs. Prescription drug management. Decision regarding hospitalization.   Fever and respiratory distress.  This is her presentation with a wide range of treatment options and carries with it a high risk of morbidity and complications.  Differential diagnosis includes, but is not limited to, pneumonia, urinary tract infection, occult stroke.  I have initiated the sepsis pathway and ordered antibiotics as well as IV fluids.  Because of tenuous respiratory status and absent gag reflex, I elected to intubate him.  In addition to standard sepsis labs, I have ordered CT of head and magnesium  level.  Initial lactic acid  level has come back elevated at 4.9 confirming presence of sepsis.  Following IV fluids, lactic acid level actually rose.  I ordered additional IV fluids.  I reviewed his laboratory tests, and my interpretation is no evidence of UTI, mildly elevated troponin felt to be from demand ischemia, acute kidney injury with creatinine 2.13 compared with 1.27 on 08/27/2023, low magnesium  level, elevated BNP consistent with heart failure, pancytopenia with hemoglobin having dropped 1.6 g compared with 08/27/2023.  Arterial blood gas obtained after he was placed on a ventilator shows respiratory acidosis, but even with improvement in PCO2, pH has not improved indicating significant ongoing metabolic acidosis.  Chest x-ray showed appropriate placement of endotracheal tube and orogastric tube, no evidence  of pneumonia, abdomen x-ray confirms appropriate placement of enteric tube, CT of head shows no acute intracranial abnormality but extensive mucosal thickening throughout the paranasal sinuses and mastoid effusions.  I have independently viewed all the images, and agree with the radiologist's interpretation.  I have discussed the case with Dr. Layman of critical care service who agrees to admit the patient.  Patient's blood pressure dropped down into the hypotensive range and I ordered additional fluids as well as initiation of norepinephrine  infusion.  Following this, blood pressure has come up.  Family has arrived and I have discussed with him how severely ill the patient was including a significant risk of death.  CRITICAL CARE Performed by: Alm Lias Total critical care time: 215 minutes Critical care time was exclusive of separately billable procedures and treating other patients. Critical care was necessary to treat or prevent imminent or life-threatening deterioration. Critical care was time spent personally by me on the following activities: development of treatment plan with patient and/or surrogate as well as nursing, discussions with consultants, evaluation of patient's response to treatment, examination of patient, obtaining history from patient or surrogate, ordering and performing treatments and interventions, ordering and review of laboratory studies, ordering and review of radiographic studies, pulse oximetry and re-evaluation of patient's condition.     Final diagnoses:  Sepsis due to undetermined organism (HCC)  Acute respiratory failure with hypoxia and hypercapnia (HCC)  Acute kidney injury (nontraumatic) (HCC)  Pancytopenia (HCC)  Elevated troponin  Elevated brain natriuretic peptide (BNP) level    ED Discharge Orders     None          Lias Alm, MD 08/31/23 [phone]

## 2023-09-01 ENCOUNTER — Inpatient Hospital Stay (HOSPITAL_COMMUNITY)

## 2023-09-01 ENCOUNTER — Other Ambulatory Visit: Payer: Self-pay | Admitting: Hematology and Oncology

## 2023-09-01 DIAGNOSIS — R6521 Severe sepsis with septic shock: Secondary | ICD-10-CM

## 2023-09-01 DIAGNOSIS — R579 Shock, unspecified: Secondary | ICD-10-CM | POA: Diagnosis not present

## 2023-09-01 DIAGNOSIS — A4150 Gram-negative sepsis, unspecified: Secondary | ICD-10-CM | POA: Diagnosis not present

## 2023-09-01 DIAGNOSIS — J9602 Acute respiratory failure with hypercapnia: Secondary | ICD-10-CM | POA: Diagnosis not present

## 2023-09-01 DIAGNOSIS — G9341 Metabolic encephalopathy: Secondary | ICD-10-CM

## 2023-09-01 DIAGNOSIS — N179 Acute kidney failure, unspecified: Secondary | ICD-10-CM | POA: Diagnosis not present

## 2023-09-01 DIAGNOSIS — J9601 Acute respiratory failure with hypoxia: Secondary | ICD-10-CM

## 2023-09-01 DIAGNOSIS — E1165 Type 2 diabetes mellitus with hyperglycemia: Secondary | ICD-10-CM

## 2023-09-01 LAB — POCT I-STAT 7, (LYTES, BLD GAS, ICA,H+H)
Acid-base deficit: 6 mmol/L — ABNORMAL HIGH (ref 0.0–2.0)
Bicarbonate: 20.5 mmol/L (ref 20.0–28.0)
Calcium, Ion: 1.05 mmol/L — ABNORMAL LOW (ref 1.15–1.40)
HCT: 27 % — ABNORMAL LOW (ref 39.0–52.0)
Hemoglobin: 9.2 g/dL — ABNORMAL LOW (ref 13.0–17.0)
O2 Saturation: 99 %
Patient temperature: 36.4
Potassium: 4.3 mmol/L (ref 3.5–5.1)
Sodium: 131 mmol/L — ABNORMAL LOW (ref 135–145)
TCO2: 22 mmol/L (ref 22–32)
pCO2 arterial: 41.8 mmHg (ref 32–48)
pH, Arterial: 7.296 — ABNORMAL LOW (ref 7.35–7.45)
pO2, Arterial: 149 mmHg — ABNORMAL HIGH (ref 83–108)

## 2023-09-01 LAB — LACTIC ACID, PLASMA
Lactic Acid, Venous: 4.8 mmol/L (ref 0.5–1.9)
Lactic Acid, Venous: 3.9 mmol/L (ref 0.5–1.9)

## 2023-09-01 LAB — CBC
HCT: 29.2 % — ABNORMAL LOW (ref 39.0–52.0)
Hemoglobin: 9.4 g/dL — ABNORMAL LOW (ref 13.0–17.0)
MCH: 31.1 pg (ref 26.0–34.0)
MCHC: 32.2 g/dL (ref 30.0–36.0)
MCV: 96.7 fL (ref 80.0–100.0)
Platelets: 18 K/uL — CL (ref 150–400)
RBC: 3.02 MIL/uL — ABNORMAL LOW (ref 4.22–5.81)
RDW: 16.1 % — ABNORMAL HIGH (ref 11.5–15.5)
WBC: 7.4 K/uL (ref 4.0–10.5)
nRBC: 0.4 % — ABNORMAL HIGH (ref 0.0–0.2)

## 2023-09-01 LAB — GLUCOSE, CAPILLARY
Glucose-Capillary: 136 mg/dL — ABNORMAL HIGH (ref 70–99)
Glucose-Capillary: 218 mg/dL — ABNORMAL HIGH (ref 70–99)
Glucose-Capillary: 229 mg/dL — ABNORMAL HIGH (ref 70–99)
Glucose-Capillary: 237 mg/dL — ABNORMAL HIGH (ref 70–99)
Glucose-Capillary: 267 mg/dL — ABNORMAL HIGH (ref 70–99)
Glucose-Capillary: 236 mg/dL — ABNORMAL HIGH (ref 70–99)

## 2023-09-01 LAB — COMPREHENSIVE METABOLIC PANEL WITH GFR
ALT: 21 U/L (ref 0–44)
AST: 47 U/L — ABNORMAL HIGH (ref 15–41)
Albumin: <1.5 g/dL — ABNORMAL LOW (ref 3.5–5.0)
Alkaline Phosphatase: 52 U/L (ref 38–126)
Anion gap: 12 (ref 5–15)
BUN: 39 mg/dL — ABNORMAL HIGH (ref 8–23)
CO2: 20 mmol/L — ABNORMAL LOW (ref 22–32)
Calcium: 7.3 mg/dL — ABNORMAL LOW (ref 8.9–10.3)
Chloride: 100 mmol/L (ref 98–111)
Creatinine, Ser: 1.78 mg/dL — ABNORMAL HIGH (ref 0.61–1.24)
GFR, Estimated: 36 mL/min — ABNORMAL LOW (ref >60)
Glucose, Bld: 206 mg/dL — ABNORMAL HIGH (ref 70–99)
Potassium: 4.3 mmol/L (ref 3.5–5.1)
Sodium: 132 mmol/L — ABNORMAL LOW (ref 135–145)
Total Bilirubin: 1 mg/dL (ref 0.0–1.2)
Total Protein: 4.5 g/dL — ABNORMAL LOW (ref 6.5–8.1)

## 2023-09-01 LAB — PHOSPHORUS: Phosphorus: 4.7 mg/dL — ABNORMAL HIGH (ref 2.5–4.6)

## 2023-09-01 LAB — HEMOGLOBIN A1C
Hgb A1c MFr Bld: 6.7 % — ABNORMAL HIGH (ref 4.8–5.6)
Mean Plasma Glucose: 146 mg/dL

## 2023-09-01 LAB — LEGIONELLA PNEUMOPHILA SEROGP 1 UR AG: L. pneumophila Serogp 1 Ur Ag: NEGATIVE

## 2023-09-01 LAB — MAGNESIUM: Magnesium: 2.5 mg/dL — ABNORMAL HIGH (ref 1.7–2.4)

## 2023-09-01 MED ORDER — INSULIN ASPART 100 UNIT/ML IJ SOLN: 2.0000 [IU] | INTRAMUSCULAR | Status: DC

## 2023-09-01 MED ORDER — PANTOPRAZOLE SODIUM 40 MG IV SOLR
40.0000 mg | Freq: Two times a day (BID) | INTRAVENOUS | Status: DC
  Administered 2023-09-01 – 2023-09-05 (×8): 40 mg via INTRAVENOUS
  Filled 2023-09-01 (×8): qty 10

## 2023-09-01 MED ORDER — DEXMEDETOMIDINE HCL IN NACL 400 MCG/100ML IV SOLN
0.0000 ug/kg/h | INTRAVENOUS | Status: DC
  Administered 2023-09-01: 0.4 ug/kg/h via INTRAVENOUS
  Administered 2023-09-01 – 2023-09-03 (×6): 0.5 ug/kg/h via INTRAVENOUS
  Administered 2023-09-03 (×2): 0.4 ug/kg/h via INTRAVENOUS
  Administered 2023-09-04 (×2): 1 ug/kg/h via INTRAVENOUS
  Administered 2023-09-04: 0.06 ug/kg/h via INTRAVENOUS
  Administered 2023-09-05: 0.8 ug/kg/h via INTRAVENOUS
  Administered 2023-09-05 (×3): 1 ug/kg/h via INTRAVENOUS
  Filled 2023-09-01 (×2): qty 100
  Filled 2023-09-01: qty 200
  Filled 2023-09-01 (×12): qty 100

## 2023-09-01 MED ORDER — INSULIN GLARGINE-YFGN 100 UNIT/ML ~~LOC~~ SOLN
5.0000 [IU] | Freq: Two times a day (BID) | SUBCUTANEOUS | Status: DC
  Administered 2023-09-01: 5 [IU] via SUBCUTANEOUS
  Filled 2023-09-01 (×2): qty 0.05

## 2023-09-01 MED ORDER — FENTANYL CITRATE PF 50 MCG/ML IJ SOSY: 25.0000 ug | PREFILLED_SYRINGE | INTRAMUSCULAR | Status: DC | PRN

## 2023-09-01 MED ORDER — PROSOURCE TF20 ENFIT COMPATIBL EN LIQD
60.0000 mL | Freq: Every day | ENTERAL | Status: DC
  Administered 2023-09-01 – 2023-09-02 (×2): 60 mL
  Filled 2023-09-01 (×3): qty 60

## 2023-09-01 MED ORDER — INSULIN ASPART 100 UNIT/ML IJ SOLN
7.0000 [IU] | INTRAMUSCULAR | Status: DC
  Administered 2023-09-01 – 2023-09-02 (×6): 7 [IU] via SUBCUTANEOUS

## 2023-09-01 MED ORDER — VITAL 1.5 CAL PO LIQD
1000.0000 mL | ORAL | Status: DC
  Administered 2023-09-01 – 2023-09-02 (×3): 1000 mL
  Filled 2023-09-01: qty 1000

## 2023-09-01 MED ORDER — INSULIN GLARGINE-YFGN 100 UNIT/ML ~~LOC~~ SOLN
10.0000 [IU] | Freq: Two times a day (BID) | SUBCUTANEOUS | Status: DC
  Administered 2023-09-01 – 2023-09-02 (×2): 10 [IU] via SUBCUTANEOUS
  Filled 2023-09-01 (×3): qty 0.1

## 2023-09-01 MED ORDER — SODIUM CHLORIDE 0.9 % IV SOLN
2.0000 g | Freq: Two times a day (BID) | INTRAVENOUS | Status: DC
  Administered 2023-09-01 – 2023-09-05 (×9): 2 g via INTRAVENOUS
  Filled 2023-09-01 (×10): qty 12.5

## 2023-09-01 MED ORDER — LACTATED RINGERS IV BOLUS
500.0000 mL | Freq: Once | INTRAVENOUS | Status: AC
  Administered 2023-09-01: 500 mL via INTRAVENOUS

## 2023-09-01 MED ORDER — SODIUM CHLORIDE 0.9 % IV SOLN
250.0000 mg | Freq: Four times a day (QID) | INTRAVENOUS | Status: AC
  Administered 2023-09-01 – 2023-09-03 (×11): 250 mg via INTRAVENOUS
  Filled 2023-09-01 (×12): qty 250

## 2023-09-01 MED ORDER — FENTANYL CITRATE PF 50 MCG/ML IJ SOSY
25.0000 ug | PREFILLED_SYRINGE | INTRAMUSCULAR | Status: DC | PRN
  Administered 2023-09-01 – 2023-09-02 (×4): 100 ug via INTRAVENOUS
  Filled 2023-09-01 (×4): qty 2

## 2023-09-01 MED ORDER — VITAL HP 1.0 CAL PO LIQD: 1000.0000 mL | ORAL | Status: DC

## 2023-09-01 MED ORDER — CALCIUM GLUCONATE-NACL 2-0.675 GM/100ML-% IV SOLN
2.0000 g | Freq: Once | INTRAVENOUS | Status: AC
  Administered 2023-09-01: 2000 mg via INTRAVENOUS
  Filled 2023-09-01: qty 100

## 2023-09-01 MED ORDER — INSULIN ASPART 100 UNIT/ML IJ SOLN
2.0000 [IU] | INTRAMUSCULAR | Status: DC
  Administered 2023-09-01: 2 [IU] via SUBCUTANEOUS

## 2023-09-01 MED ORDER — SODIUM CHLORIDE 0.9% IV SOLUTION: Freq: Once | INTRAVENOUS | Status: AC

## 2023-09-01 NOTE — Progress Notes (Signed)
 NAME:  Michael [name], MRN:  [id number], DOB:  April 07, 1930, LOS: 1 ADMISSION DATE:  08/30/2023 CONSULTATION DATE:  08/31/2023 REFERRING MD:  Raford - EDP, CHIEF COMPLAINT:  Respiratory distress   History of Present Illness:  89 year old man who presented to [facility] ED 8/4 with respiratory distress. PMHx significant for T2DM, GERD, CLL (diagnosed 2010 with LAD/splenomegaly), immune-related thrombocytopenia/ITP.  Patient was brought to ED for respiratory distress. Per family, became suddenly unresponsive at home while sitting at the table. On EMS arrival, +spontaneous respirations but unresponsive and hypotensive with SBP 60s. NS 500mL given with improvement in SBP to 100s. Required BVM ventilations with EMS and was intubated on ED arrival. In ED, patient was febrile to 104.1, HR 109, BP 150/108, RR 19. SpO2 97%. Labs were notable for WBC 3.5, Hgb 7.9 (baseline ~9-10), Plt 40 (baseline ~60s). INR 1.3. Na 136, K 3.7, CO2 24, BUN/Cr 30/2.13 (baseline Cr. 1-1.3). Mg 1.3. LFTs WNL. BNP 464, trop 25. LA 4.9 > 6.0. ABG 7.123/80.8/161/25.8. UA +small gluc/Hgb/prot. COVID positive. CXR unremarkable. CT Head Negative for acute abnormality, +mucosal thickening of sinuses/mastoid effusions and L middle ear opacification. Broad-spectrum antibiotics started.  PCCM consulted for ICU admission.  History is obtained from patient's daughters (at bedside). They report he has had increased fatigue for several months, followed by poor PO intake/lack of appetite for ~2 weeks. Recently had chills at home (unknown if febrile) and respiratory symptoms such as cough and congestion. No n/v/d or changes in bowel or urinary habits to their knowledge. At baseline, patient is reportedly independent at home, lives with his daughter who provides minimal assistance (patient still drives short distances). We briefly discussed code status and patient's daughters confirmed that he would want everything including compressions if  necessary.  Pertinent Medical History:   Past Medical History:  Diagnosis Date   Anemia    Anginal pain (HCC)    upon exertion   Arthritis    left shoulder, lower back (10/20/2016)   CLL (chronic lymphoblastic leukemia) dx'd 2000   Edema leg    Family history of adverse reaction to anesthesia    daughter's BP drops    GERD (gastroesophageal reflux disease)    History of hiatal hernia    Leukemia, chronic lymphoid (HCC) 12/08/2010   Lymphocytosis    Type II diabetes mellitus (HCC)    Significant Hospital Events: Including procedures, antibiotic start and stop dates in addition to other pertinent events   8/5 - Presented to [facility] ED via EMS for respiratory distress/unresponsiveness at home; required BVM ventilation with EMS. Intubated on ED arrival. Febrile in ED to 104F. COVID+. PCCM consulted for admission. After xfer to ICU remained on high dose pressors, POCUS hyperdynamic. IVC not collapsing. Getting CT angiogram to r/o PE. Broad spec abx continued. Added stress dose steroids. CT angio neg for PE. BC + GNR AND GPC; BCI staph epidermis, favoring primary GNR  8/6 platelet count down to 18,000, spoke with heme/ONC, giving pulse steroids today 1 g daily x 3 days, pressor requirements improved overnight, lactate slowly improving.  Remains tachycardic.  Tube feeds going.  Changing fentanyl  to Precedex   Interim History / Subjective:  Pressor requirements have improved over the night, lactate clearing slowly.  Platelets now down to 18,000.  He is a little more awake  Objective:  Blood pressure (!) 123/56, pulse (!) 120, temperature 98.6 F (37 C), temperature source Bladder, resp. rate 19, height 5' 7 (1.702 m), weight 103 kg, SpO2 100%. CVP:  [2 mmHg-6 mmHg] 5  mmHg  Vent Mode: PRVC FiO2 (%):  [40 %-60 %] 40 % Set Rate:  [26 bmp-30 bmp] 26 bmp Vt Set:  [530 mL] 530 mL PEEP:  [8 cmH20] 8 cmH20 Plateau Pressure:  [20 cmH20-26 cmH20] 20 cmH20   Intake/Output Summary (Last 24 hours)  at 09/01/2023 9187 Last data filed at 09/01/2023 0700 Gross per 24 hour  Intake 4646.43 ml  Output 1655 ml  Net 2991.43 ml   Filed Weights   08/31/23 0003 08/31/23 0500 09/01/23 0500  Weight: 91 kg 97.1 kg 103 kg   Physical Examination: General This is a 89 year old male patient he is currently still on full Pulmonary clear, diminished in the bases no accessory use ventilatory support, current Plateau pressures are 20, driving pressure is 15, arterial blood gas reviewed and primarily metabolic acidosis persists Pcxr fairly clear. Little vol loss left base. Support tubes/lines good. Certainly now worse  Cardiac sinus tachycardia no murmur rub or gallop Extremities chronic lymphedema lower extremities pulses are palpable increasing anasarca Abdomen soft nontender GU Foley catheter with clear yellow urine Neuro opens eyes to light stimulation, starting to localize, no focal deficits appreciated  Resolved Hospital Problem List:    Assessment & Plan:  Acute hypoxic and hypercarbic respiratory failure 2/2  COVID-19 infection +/-CAP -CT w/ really not impressive RLL airspace disease. ABG reviewed still w/ metabolic acidosis Plan Cont full vent support Repeat ABG this am  cont BDs PAD protocol RASS goal -1 Repeat CXR this am  F/u resp culture as well as urine legionella antigens VAP bundle  Cont cefepime  and vanc day 2 Day 2 steroids (hydrocort  for shock, PNA and COVID) but changing to 1gm/d x 3d Daily assessment for weaning   Sinus tachycardia, Septic shock w/ GNR bacteremia and  lactic acidosis.  Echo in June this year w/ nml EF and wall motion Pressors down a little, lactate finally trending down  Also had GPC in one of cultures but think this was contaminate  Plan Titrate norepi and vasopressin  w/ goal MAP >65 Added stress dose steroids, now day 2 Trend procal Abx as above, day 2 cefepime  and vanc. Awaiting cultures. Given how sick he is will hold off from stopping Vanc right now  especially as starting immunosuppressant dosing steroids Giving 500 ml LR bolus w/ the tachycardia. CVP ~5 and even though + 8 liters has had sig insensible loss w/ fever and metabolic demand.  Repeat lactate today   AKI, likely ATN in the setting of sepsis/hypotension Now s/p volume resuscitation-->slowly improving Plan MAP goal > 65 Foley for strict I&O Renal dose meds Serial chems  Acute metabolic encephalopathy 2/2 sepsis and hypercarbia  Plan Supportive care Change fent to dex  PAD protocol  Fluid and electrolyte imbalance: Hypocalcemia Plan Repeat calcium  gluconate today   T2DM w/ hyperglycemia->anticipate will get worse w/ steroids Plan SSI goal 140-180 Adding basal dosing 5 units q 12  GERD Plan Cont PPI  CLL (diagnosed 2010 with LAD/splenomegaly) Immune-related thrombocytopenia/ITP Plan Trend CBC (WBC, Plt) Monitor for signs of active bleeding PLTs 18K today. Will cont supportive care. If he needs procedure at this point would transfuse. I've reached out to his primary heme/onc will see what additional recs. Will change steroids to 250 mg IV q 6 x 3d.   Chronic BLE lymphedema Plan OT/PT consult  Best Practice: (right click and Reselect all SmartList Selections daily)   Diet/type: NPO, started tube feeds DVT prophylaxis: SCDs - thrombocytopenic GI prophylaxis: PPI Lines: N/A Foley:  Yes,  and it is still needed Code Status:  full code Last date of multidisciplinary goals of care discussion [Pending]    Critical care time: 55 min

## 2023-09-01 NOTE — Progress Notes (Signed)
 Nutrition Brief Note  Consult received for enteral nutrition initiation and management.  TF ordered for trickles yesterday given poor blood pressure control and pressor support. Noted improvement today, therefore will place order for tube feeding tiration to goal as recommended below. Reached out to RN and updated on nutrition plan of care.   Titrate Vital 1.5 by 10ml q12h to goal rate of 80ml/hr (1080ml daily) Add 60ml ProSource TF20 once daily Provides 1620 kcal, 73g protein, 825ml free water daily  Will continue to monitor and adjust nutrition interventions as appropriate.   Allie Zubin Pontillo, RDN, LDN Clinical Nutrition See AMiON for contact information.

## 2023-09-01 NOTE — Progress Notes (Signed)
 PHARMACY NOTE:  ANTIMICROBIAL RENAL DOSAGE ADJUSTMENT  Current antimicrobial regimen includes a mismatch between antimicrobial dosage and estimated renal function.  As per policy approved by the Pharmacy & Therapeutics and Medical Executive Committees, the antimicrobial dosage will be adjusted accordingly.  Current antimicrobial dosage:  cefepime  q24h  Indication: bactremia  Renal Function:  Estimated Creatinine Clearance: 30.9 mL/min (A) (by C-G formula based on SCr of 1.78 mg/dL (H)). []      On intermittent HD, scheduled: []      On CRRT    Antimicrobial dosage has been changed to:  q12h  Additional comments:   Thank you for allowing pharmacy to be a part of this patient's care.  Michael [name], [facility] 09/01/2023 10:01 AM

## 2023-09-01 NOTE — Progress Notes (Signed)
 Michael [name]   DOB:22-Dec-1930   FM#:[id number]    ASSESSMENT & PLAN:  CLL (chronic lymphocytic leukemia) (HCC) The patient was diagnosed with CLL in 2010, Rai stage II with lymphadenopathy and splenomegaly, FISH analysis showed trisomy 80 He received ibrutinib  between April 2017 to November 2023  CT imaging from May showed progressive splenomegaly and lymphadenopathy Calquence  was started on June 1 Treatment was recently interrupted due to progressive thrombocytopenia and that was subsequently discontinued  I will cancel his appointment at the cancer center Continue supportive care  Thrombocytopenia [facility]) 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 He was not responding to low-dose prednisone  in the outpatient clinic.  The maximum dose he was given was 40 mg.  He is currently receiving stress dose steroids He was supposed to get trial of rituximab next week Everything will be placed on hold I anticipate his platelet count will continue to drop with his sickness and broad-spectrum IV antibiotics Transfuse if needed if bleeding or platelet count less than 10,000  COVID-positive, respiratory failure Currently on maximum supportive care and ventilatory support  Discharge planning He will likely be here for several days Will defer treatment to the ICU team I spoke with the daughter today and reviewed plan of care with her  Almarie Bedford, MD 09/01/2023 2:26 PM  Subjective:  I was informed by the ICU team that the patient has been admitted to the hospital with septic shock and is currently intubated and ventilated.  He is on IV antibiotics and high-dose steroids His daughter by the bedside No observed bleeding so far despite progressive thrombocytopenia  Objective:  Vitals:   09/01/23 1245 09/01/23 1300  BP:    Pulse: (!) 112 (!) 103  Resp: (!) 26 (!) 26  Temp:    SpO2: 100% 100%     Intake/Output Summary (Last 24 hours) at 09/01/2023 1426 Last data  filed at 09/01/2023 1300 Gross per 24 hour  Intake 4456.31 ml  Output 1995 ml  Net 2461.31 ml

## 2023-09-01 NOTE — Plan of Care (Signed)
  Problem: Respiratory: Goal: Will maintain a patent airway Outcome: Progressing   Problem: Clinical Measurements: Goal: Ability to maintain clinical measurements within normal limits will improve Outcome: Progressing   Problem: Nutrition: Goal: Adequate nutrition will be maintained Outcome: Progressing   Problem: Elimination: Goal: Will not experience complications related to bowel motility Outcome: Progressing   Problem: Skin Integrity: Goal: Risk for impaired skin integrity will decrease Outcome: Not Progressing

## 2023-09-01 NOTE — TOC Initial Note (Signed)
 Transition of Care [facility]) - Initial/Assessment Note    Patient Details  Name: Michael [name] MRN: [id number] Date of Birth: 1930-06-24  Transition of [facility]) CM/SW Contact:    Sudie Erminio Deems, RN Phone Number: 09/01/2023, 1:12 PM  Clinical Narrative:  Patient presented for respiratory distress-on full vent support. Case Manager was able to speak with daughter Jori. Case Manager did speak with Jori and she states she lives with her dad. PTA she states patient was independent and able to drive to his PCP appointments. Patient does have a cane in the home. Case Manager will continue to follow for disposition needs.               Expected Discharge Plan:  (TBD) Barriers to Discharge: Continued Medical Work up   Patient Goals and CMS Choice     Choice offered to / list presented to : NA      Expected Discharge Plan and Services In-house Referral: NA Discharge Planning Services: CM Consult                       DME Agency: NA    Current home services: DME (cane) Criminal Activity/Legal Involvement Pertinent to Current Situation/Hospitalization: No - Comment as needed  Activities of Daily Living      Permission Sought/Granted Permission sought to share information with : Family Supports, Case Manager     Emotional Assessment Appearance:: Appears stated age Attitude/Demeanor/Rapport: Intubated (Following Commands or Not Following Commands) Affect (typically observed): Unable to Assess   Alcohol / Substance Use: Not Applicable Psych Involvement: No (comment)  Admission diagnosis:  Shock (HCC) [R57.9] Elevated troponin [R79.89] Acute kidney injury (nontraumatic) (HCC) [N17.9] Elevated brain natriuretic peptide (BNP) level [R79.89] Elevated random blood glucose level [R73.9] Pancytopenia (HCC) [D61.818] Sepsis due to undetermined organism (HCC) [A41.9] Acute respiratory failure with hypoxia and hypercapnia (HCC) [J96.01, J96.02] Patient Active  Problem List   Diagnosis Date Noted   Shock (HCC) 08/31/2023   Chronic ITP (idiopathic thrombocytopenia) (HCC) 08/27/2023   Hematuria 08/05/2023   Anemia due to antineoplastic chemotherapy 08/05/2023   Iron  deficiency anemia 02/20/2022   Acute on chronic diastolic CHF (congestive heart failure) (HCC) 02/19/2022   Maxillary fracture (HCC) 02/19/2022   Syncope and collapse 02/19/2022   Stage 3a chronic kidney disease (CKD) (HCC) 02/19/2022   Obesity (BMI 30-39.9) 02/19/2022   Superficial retinal hemorrhage of right eye 07/16/2021   Stable treated proliferative diabetic retinopathy of right eye determined by examination associated with type 2 diabetes mellitus (HCC) 10/23/2019   Proliferative diabetic retinopathy of left eye (HCC) 10/23/2019   Chronic kidney disease, stage III (moderate) (HCC) 09/12/2019   Type II diabetes mellitus (HCC)    Acute cystitis without hematuria    Malnutrition of moderate degree 12/21/2017   Hypotension 12/14/2017   Hyponatremia 12/14/2017   ARF (acute renal failure) (HCC) 12/14/2017   Pressure ulcer 11/22/2017   Ileus (HCC) 11/14/2017   S/P left inguinal hernia repair 11/10/2017   Weight loss, non-intentional 04/12/2017   Essential hypertension 10/20/2016   Paresthesias 10/19/2016   Left inguinal hernia 10/12/2016   Protein-calorie malnutrition, mild (HCC) 01/10/2016   Diarrhea due to drug 06/17/2015   Chronic left shoulder pain 06/17/2015   Bilateral leg edema 06/17/2015   Pancytopenia, acquired (HCC) 04/22/2015   Thrombocytopenia (HCC) 10/11/2014   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 [place] [address] Phone: [phone] Fax: [phone]  [facility] [address] [place], [place] - [address] [place] [address] Phone:  [phone] Fax: [phone]  [facility] [address] [place] - [address] [facility] [place] [address] Phone: [phone] Fax: [phone]  Social Drivers of Health (SDOH) Social History: SDOH Screenings   Food Insecurity: No Food Insecurity (02/09/2023)  Housing: Unknown (02/09/2023)  Transportation Needs: No Transportation Needs (02/09/2023)  Utilities: Not At Risk (02/20/2022)  Depression (PHQ2-9): Low Risk  (02/09/2023)  Social Connections: Moderately Isolated (02/09/2023)  Tobacco Use: Medium Risk (08/27/2023)  Health Literacy: Adequate Health Literacy (10/20/2022)    Readmission Risk Interventions     No data to display

## 2023-09-02 DIAGNOSIS — E1165 Type 2 diabetes mellitus with hyperglycemia: Secondary | ICD-10-CM | POA: Diagnosis not present

## 2023-09-02 DIAGNOSIS — U071 COVID-19: Secondary | ICD-10-CM | POA: Diagnosis not present

## 2023-09-02 DIAGNOSIS — J9602 Acute respiratory failure with hypercapnia: Secondary | ICD-10-CM | POA: Diagnosis not present

## 2023-09-02 DIAGNOSIS — J9601 Acute respiratory failure with hypoxia: Secondary | ICD-10-CM | POA: Diagnosis not present

## 2023-09-02 DIAGNOSIS — D696 Thrombocytopenia, unspecified: Secondary | ICD-10-CM

## 2023-09-02 LAB — PHOSPHORUS: Phosphorus: 3 mg/dL (ref 2.5–4.6)

## 2023-09-02 LAB — LACTIC ACID, PLASMA: Lactic Acid, Venous: 2.6 mmol/L (ref 0.5–1.9)

## 2023-09-02 LAB — BPAM PLATELET PHERESIS
Blood Product Expiration Date: 202508082359
ISSUE DATE / TIME: 202508061556
Unit Type and Rh: 5100

## 2023-09-02 LAB — PREPARE PLATELET PHERESIS: Unit division: 0

## 2023-09-02 LAB — GLUCOSE, CAPILLARY
Glucose-Capillary: 226 mg/dL — ABNORMAL HIGH (ref 70–99)
Glucose-Capillary: 199 mg/dL — ABNORMAL HIGH (ref 70–99)
Glucose-Capillary: 236 mg/dL — ABNORMAL HIGH (ref 70–99)
Glucose-Capillary: 235 mg/dL — ABNORMAL HIGH (ref 70–99)
Glucose-Capillary: 219 mg/dL — ABNORMAL HIGH (ref 70–99)
Glucose-Capillary: 202 mg/dL — ABNORMAL HIGH (ref 70–99)

## 2023-09-02 LAB — COMPREHENSIVE METABOLIC PANEL WITH GFR
ALT: 22 U/L (ref 0–44)
AST: 53 U/L — ABNORMAL HIGH (ref 15–41)
Albumin: <1.5 g/dL — ABNORMAL LOW (ref 3.5–5.0)
Alkaline Phosphatase: 57 U/L (ref 38–126)
Anion gap: 8 (ref 5–15)
BUN: 36 mg/dL — ABNORMAL HIGH (ref 8–23)
CO2: 22 mmol/L (ref 22–32)
Calcium: 7.5 mg/dL — ABNORMAL LOW (ref 8.9–10.3)
Chloride: 103 mmol/L (ref 98–111)
Creatinine, Ser: 1.11 mg/dL (ref 0.61–1.24)
GFR, Estimated: >60 mL/min (ref >60)
Glucose, Bld: 229 mg/dL — ABNORMAL HIGH (ref 70–99)
Potassium: 4.7 mmol/L (ref 3.5–5.1)
Sodium: 133 mmol/L — ABNORMAL LOW (ref 135–145)
Total Bilirubin: 1.1 mg/dL (ref 0.0–1.2)
Total Protein: 4.7 g/dL — ABNORMAL LOW (ref 6.5–8.1)

## 2023-09-02 LAB — CULTURE, BLOOD (ROUTINE X 2): Special Requests: ADEQUATE

## 2023-09-02 LAB — MAGNESIUM: Magnesium: 2.5 mg/dL — ABNORMAL HIGH (ref 1.7–2.4)

## 2023-09-02 MED ORDER — INSULIN ASPART 100 UNIT/ML IJ SOLN
9.0000 [IU] | INTRAMUSCULAR | Status: DC
  Administered 2023-09-02 – 2023-09-03 (×4): 9 [IU] via SUBCUTANEOUS

## 2023-09-02 MED ORDER — FENTANYL CITRATE PF 50 MCG/ML IJ SOSY
12.5000 ug | PREFILLED_SYRINGE | INTRAMUSCULAR | Status: DC | PRN
  Administered 2023-09-02 – 2023-09-03 (×3): 25 ug via INTRAVENOUS
  Administered 2023-09-04: 12.5 ug via INTRAVENOUS
  Administered 2023-09-05 (×2): 25 ug via INTRAVENOUS
  Filled 2023-09-02 (×7): qty 1

## 2023-09-02 MED ORDER — INSULIN GLARGINE-YFGN 100 UNIT/ML ~~LOC~~ SOLN
20.0000 [IU] | Freq: Two times a day (BID) | SUBCUTANEOUS | Status: DC
  Administered 2023-09-02 – 2023-09-04 (×4): 20 [IU] via SUBCUTANEOUS
  Filled 2023-09-02 (×5): qty 0.2

## 2023-09-02 NOTE — Progress Notes (Signed)
 NAME:  Michael [name], MRN:  [id number], DOB:  [birth year], LOS: 2 ADMISSION DATE:  08/30/2023 CONSULTATION DATE:  08/31/2023 REFERRING MD:  Raford - EDP, CHIEF COMPLAINT:  Respiratory distress   History of Present Illness:  89 year old man who presented to [facility] ED 8/4 with respiratory distress. PMHx significant for T2DM, GERD, CLL (diagnosed 2010 with LAD/splenomegaly), immune-related thrombocytopenia/ITP.  Patient was brought to ED for respiratory distress. Per family, became suddenly unresponsive at home while sitting at the table. On EMS arrival, +spontaneous respirations but unresponsive and hypotensive with SBP 60s. NS 500mL given with improvement in SBP to 100s. Required BVM ventilations with EMS and was intubated on ED arrival. In ED, patient was febrile to 104.1, HR 109, BP 150/108, RR 19. SpO2 97%. Labs were notable for WBC 3.5, Hgb 7.9 (baseline ~9-10), Plt 40 (baseline ~60s). INR 1.3. Na 136, K 3.7, CO2 24, BUN/Cr 30/2.13 (baseline Cr. 1-1.3). Mg 1.3. LFTs WNL. BNP 464, trop 25. LA 4.9 > 6.0. ABG 7.123/80.8/161/25.8. UA +small gluc/Hgb/prot. COVID positive. CXR unremarkable. CT Head Negative for acute abnormality, +mucosal thickening of sinuses/mastoid effusions and L middle ear opacification. Broad-spectrum antibiotics started.  PCCM consulted for ICU admission.  History is obtained from patient's daughters (at bedside). They report he has had increased fatigue for several months, followed by poor PO intake/lack of appetite for ~2 weeks. Recently had chills at home (unknown if febrile) and respiratory symptoms such as cough and congestion. No n/v/d or changes in bowel or urinary habits to their knowledge. At baseline, patient is reportedly independent at home, lives with his daughter who provides minimal assistance (patient still drives short distances). We briefly discussed code status and patient's daughters confirmed that he would want everything including compressions if  necessary.  Pertinent Medical History:   Past Medical History:  Diagnosis Date   Anemia    Anginal pain (HCC)    upon exertion   Arthritis    left shoulder, lower back (10/20/2016)   CLL (chronic lymphoblastic leukemia) dx'd 2000   Edema leg    Family history of adverse reaction to anesthesia    daughter's BP drops    GERD (gastroesophageal reflux disease)    History of hiatal hernia    Leukemia, chronic lymphoid (HCC) 12/08/2010   Lymphocytosis    Type II diabetes mellitus (HCC)    Significant Hospital Events: Including procedures, antibiotic start and stop dates in addition to other pertinent events   8/5 - Presented to [facility] ED via EMS for respiratory distress/unresponsiveness at home; required BVM ventilation with EMS. Intubated on ED arrival. Febrile in ED to 104F. COVID+. PCCM consulted for admission. After xfer to ICU remained on high dose pressors, POCUS hyperdynamic. IVC not collapsing. Getting CT angiogram to r/o PE. Broad spec abx continued. Added stress dose steroids. CT angio neg for PE. BC + GNR AND GPC; BCI staph epidermis, favoring primary GNR  8/6 platelet count down to 18,000, spoke with heme/ONC, giving pulse steroids today 1 g daily x 3 days, pressor requirements improved overnight, lactate slowly improving.  Remains tachycardic.  Tube feeds going.  Changing fentanyl  to Precedex   Interim History / Subjective:  Failed SBT this am r/t significant tachypnea, agitation, respiratory distress.  Received fentanyl  100mcg at 6am for agitation per RN - now very sedate.  Objective:  Blood pressure (!) 124/45, pulse (!) 114, temperature (!) 101.1 F (38.4 C), resp. rate (!) 26, height 5' 7 (1.702 m), weight 101.2 kg, SpO2 100%. CVP:  [3 mmHg-40 mmHg] 9  mmHg  Vent Mode: PRVC FiO2 (%):  [40 %] 40 % Set Rate:  [26 bmp] 26 bmp Vt Set:  [530 mL] 530 mL PEEP:  [5 cmH20-8 cmH20] 5 cmH20 Plateau Pressure:  [19 cmH20-21 cmH20] 20 cmH20   Intake/Output Summary (Last 24 hours)  at 09/02/2023 0850 Last data filed at 09/02/2023 9171 Gross per 24 hour  Intake 3458.03 ml  Output 2425 ml  Net 1033.03 ml   Filed Weights   08/31/23 0500 09/01/23 0500 09/02/23 0500  Weight: 97.1 kg 103 kg 101.2 kg   Physical Examination: General:  elderly male, NAD on vent  HEENT: MM pink/moist, ETT Neuro: RASS -3, pupils 2mm sluggish, L cataract CV: s1s2 rrr, no m/r/g PULM:  resps even non labored on vent, diminished bases,few scattered rhonchi  GI: soft, bsx4 active  Extremities: warm/dry, chronic BLE lymphedema  Skin: no rashes or lesions  Resolved Hospital Problem List:    Assessment & Plan:  Acute hypoxic and hypercarbic respiratory failure 2/2  COVID-19 infection +/-CAP Plan Cont full vent support - failed SBT this am.  F/u ABG cont BDs PAD protocol RASS goal -1 - limit sedation  Repeat CXR this am  F/u resp culture  VAP bundle  Cont cefepime  and vanc day 3 Day 3 steroids  - 1gm/d x 3d Daily SBT  Sinus tachycardia, Septic shock w/ GNR bacteremia and lactic acidosis.  Echo in June this year w/ nml EF and wall motion Weaning pressors, lactate finally trending down  Also had GPC in one of cultures but think this was contaminate  Plan Titrate norepi and vasopressin  w/ goal MAP >65 Trend procal Abx as above, day 3 cefepime  (vanc off) Trend lactate   AKI, likely ATN in the setting of sepsis/hypotension Now s/p volume resuscitation-->slowly improving Plan MAP goal > 65 Foley for strict I&O Renal dose meds Serial chems  Acute metabolic encephalopathy 2/2 sepsis and hypercarbia  Plan Supportive care Change fent to dex  PAD protocol  Fluid and electrolyte imbalance: Hypocalcemia Plan Repeat calcium  gluconate today   T2DM w/ hyperglycemia->anticipate will get worse w/ steroids Plan Semglee  5 units BID  TF coverage 2 units q4h  SSI  GERD Plan Cont PPI  CLL (diagnosed 2010 with LAD/splenomegaly) Immune-related thrombocytopenia/ITP Thrombocytopenia  with spontaneous thigh hematoma Plan Trend CBC (WBC, Plt) S/p 1 unit plt 8/6 Heme/onc following  steroids to 250 mg IV q 6 x 3d.   Chronic BLE lymphedema Plan OT/PT consult  Best Practice: (right click and Reselect all SmartList Selections daily)   Diet/type: NPO, started tube feeds DVT prophylaxis: SCDs - thrombocytopenic GI prophylaxis: PPI Lines: N/A Foley:  Yes, and it is still needed Code Status:  full code Last date of multidisciplinary goals of care discussion [Pending]    Critical care time: 34 min    Rockie Myers, NP Pulmonary/Critical Care Medicine  09/02/2023  8:50 AM   See Tracey for personal pager PCCM on call pager [phone] until 7pm. Please call Elink 7p-7a. [phone]

## 2023-09-02 NOTE — Plan of Care (Signed)
  Problem: Clinical Measurements: Goal: Ability to maintain clinical measurements within normal limits will improve Outcome: Progressing   Problem: Nutrition: Goal: Adequate nutrition will be maintained Outcome: Progressing   Problem: Elimination: Goal: Will not experience complications related to bowel motility Outcome: Progressing   Problem: Pain Managment: Goal: General experience of comfort will improve and/or be controlled Outcome: Progressing   Problem: Safety: Goal: Ability to remain free from injury will improve Outcome: Progressing   Problem: Skin Integrity: Goal: Risk for impaired skin integrity will decrease Outcome: Progressing

## 2023-09-02 NOTE — Progress Notes (Signed)
   09/02/23 0754  Daily Weaning Assessment  Daily Assessment of Readiness to Wean Wean protocol criteria met (SBT performed)  SBT Method CPAP 5 cm H20 and PS 5 cm H20 (12/5)  Patient response Failed SBT terminated  Reason SBT Terminated RR > 35 breaths/min or Frequency/TV ratio >105;Excessive agitation, marked accessory muscle use, abdominal paradox, or diaphoresis noted

## 2023-09-03 ENCOUNTER — Inpatient Hospital Stay: Admitting: Hematology and Oncology

## 2023-09-03 ENCOUNTER — Inpatient Hospital Stay

## 2023-09-03 ENCOUNTER — Inpatient Hospital Stay (HOSPITAL_COMMUNITY)

## 2023-09-03 DIAGNOSIS — A4152 Sepsis due to Pseudomonas: Secondary | ICD-10-CM | POA: Diagnosis not present

## 2023-09-03 DIAGNOSIS — D693 Immune thrombocytopenic purpura: Secondary | ICD-10-CM

## 2023-09-03 DIAGNOSIS — R6521 Severe sepsis with septic shock: Secondary | ICD-10-CM | POA: Diagnosis not present

## 2023-09-03 DIAGNOSIS — B965 Pseudomonas (aeruginosa) (mallei) (pseudomallei) as the cause of diseases classified elsewhere: Secondary | ICD-10-CM

## 2023-09-03 DIAGNOSIS — U071 COVID-19: Secondary | ICD-10-CM | POA: Diagnosis not present

## 2023-09-03 LAB — CBC
HCT: 22.8 % — ABNORMAL LOW (ref 39.0–52.0)
HCT: 21.4 % — ABNORMAL LOW (ref 39.0–52.0)
HCT: 23.1 % — ABNORMAL LOW (ref 39.0–52.0)
Hemoglobin: 7.5 g/dL — ABNORMAL LOW (ref 13.0–17.0)
Hemoglobin: 7.1 g/dL — ABNORMAL LOW (ref 13.0–17.0)
Hemoglobin: 7.9 g/dL — ABNORMAL LOW (ref 13.0–17.0)
MCH: 30.9 pg (ref 26.0–34.0)
MCH: 31 pg (ref 26.0–34.0)
MCH: 31.1 pg (ref 26.0–34.0)
MCHC: 32.9 g/dL (ref 30.0–36.0)
MCHC: 33.2 g/dL (ref 30.0–36.0)
MCHC: 34.2 g/dL (ref 30.0–36.0)
MCV: 93.8 fL (ref 80.0–100.0)
MCV: 93.4 fL (ref 80.0–100.0)
MCV: 90.9 fL (ref 80.0–100.0)
Platelets: <5 K/uL — CL (ref 150–400)
Platelets: 5 K/uL — CL (ref 150–400)
Platelets: 10 K/uL — CL (ref 150–400)
RBC: 2.43 MIL/uL — ABNORMAL LOW (ref 4.22–5.81)
RBC: 2.29 MIL/uL — ABNORMAL LOW (ref 4.22–5.81)
RBC: 2.54 MIL/uL — ABNORMAL LOW (ref 4.22–5.81)
RDW: 15.8 % — ABNORMAL HIGH (ref 11.5–15.5)
RDW: 16 % — ABNORMAL HIGH (ref 11.5–15.5)
RDW: 16.1 % — ABNORMAL HIGH (ref 11.5–15.5)
WBC: 4.2 K/uL (ref 4.0–10.5)
WBC: 4.2 K/uL (ref 4.0–10.5)
WBC: 4.6 K/uL (ref 4.0–10.5)
nRBC: 2.1 % — ABNORMAL HIGH (ref 0.0–0.2)
nRBC: 0 % (ref 0.0–0.2)
nRBC: 0 % (ref 0.0–0.2)

## 2023-09-03 LAB — BASIC METABOLIC PANEL WITH GFR
Anion gap: 8 (ref 5–15)
BUN: 34 mg/dL — ABNORMAL HIGH (ref 8–23)
CO2: 23 mmol/L (ref 22–32)
Calcium: 7.5 mg/dL — ABNORMAL LOW (ref 8.9–10.3)
Chloride: 105 mmol/L (ref 98–111)
Creatinine, Ser: 0.94 mg/dL (ref 0.61–1.24)
GFR, Estimated: >60 mL/min (ref >60)
Glucose, Bld: 180 mg/dL — ABNORMAL HIGH (ref 70–99)
Potassium: 4.3 mmol/L (ref 3.5–5.1)
Sodium: 136 mmol/L (ref 135–145)

## 2023-09-03 LAB — GLUCOSE, CAPILLARY
Glucose-Capillary: 108 mg/dL — ABNORMAL HIGH (ref 70–99)
Glucose-Capillary: 109 mg/dL — ABNORMAL HIGH (ref 70–99)
Glucose-Capillary: 128 mg/dL — ABNORMAL HIGH (ref 70–99)
Glucose-Capillary: 162 mg/dL — ABNORMAL HIGH (ref 70–99)
Glucose-Capillary: 169 mg/dL — ABNORMAL HIGH (ref 70–99)
Glucose-Capillary: 17 mg/dL — CL (ref 70–99)
Glucose-Capillary: 173 mg/dL — ABNORMAL HIGH (ref 70–99)
Glucose-Capillary: 22 mg/dL — CL (ref 70–99)
Glucose-Capillary: 51 mg/dL — ABNORMAL LOW (ref 70–99)
Glucose-Capillary: 52 mg/dL — ABNORMAL LOW (ref 70–99)
Glucose-Capillary: 68 mg/dL — ABNORMAL LOW (ref 70–99)

## 2023-09-03 LAB — PHOSPHORUS: Phosphorus: 2.3 mg/dL — ABNORMAL LOW (ref 2.5–4.6)

## 2023-09-03 LAB — LACTIC ACID, PLASMA: Lactic Acid, Venous: 2 mmol/L (ref 0.5–1.9)

## 2023-09-03 LAB — MAGNESIUM: Magnesium: 2.5 mg/dL — ABNORMAL HIGH (ref 1.7–2.4)

## 2023-09-03 LAB — PREPARE RBC (CROSSMATCH)

## 2023-09-03 MED ORDER — HYDROCERIN EX CREA
TOPICAL_CREAM | Freq: Every day | CUTANEOUS | Status: DC
  Filled 2023-09-03: qty 113

## 2023-09-03 MED ORDER — DEXTROSE 50 % IV SOLN
12.5000 g | INTRAVENOUS | Status: AC
  Administered 2023-09-03: 12.5 g via INTRAVENOUS
  Filled 2023-09-03: qty 50

## 2023-09-03 MED ORDER — GERHARDT'S BUTT CREAM
1.0000 | TOPICAL_CREAM | Freq: Every day | CUTANEOUS | Status: DC
  Administered 2023-09-03 – 2023-09-05 (×3): 1 via TOPICAL
  Filled 2023-09-03: qty 60

## 2023-09-03 MED ORDER — SODIUM CHLORIDE 0.9% IV SOLUTION: Freq: Once | INTRAVENOUS | Status: AC

## 2023-09-03 MED ORDER — AMIODARONE HCL IN DEXTROSE 360-4.14 MG/200ML-% IV SOLN
30.0000 mg/h | INTRAVENOUS | Status: DC
  Administered 2023-09-03 – 2023-09-05 (×4): 30 mg/h via INTRAVENOUS
  Filled 2023-09-03 (×3): qty 200

## 2023-09-03 MED ORDER — AMIODARONE LOAD VIA INFUSION
150.0000 mg | Freq: Once | INTRAVENOUS | Status: AC
  Administered 2023-09-03: 150 mg via INTRAVENOUS
  Filled 2023-09-03: qty 83.34

## 2023-09-03 MED ORDER — AMIODARONE HCL IN DEXTROSE 360-4.14 MG/200ML-% IV SOLN
60.0000 mg/h | INTRAVENOUS | Status: AC
  Administered 2023-09-03 (×2): 60 mg/h via INTRAVENOUS
  Filled 2023-09-03: qty 400
  Filled 2023-09-03: qty 200

## 2023-09-03 MED ORDER — ACETAMINOPHEN 10 MG/ML IV SOLN
1000.0000 mg | Freq: Once | INTRAVENOUS | Status: AC | PRN
  Administered 2023-09-03: 1000 mg via INTRAVENOUS
  Filled 2023-09-03: qty 100

## 2023-09-03 MED ORDER — SODIUM CHLORIDE 0.9% IV SOLUTION: Freq: Once | INTRAVENOUS | Status: DC

## 2023-09-03 NOTE — Progress Notes (Signed)
 NAME:  Michael [name], MRN:  [id number], DOB:  09/14/1930, LOS: 3 ADMISSION DATE:  08/30/2023 CONSULTATION DATE:  08/31/2023 REFERRING MD:  Raford - EDP, CHIEF COMPLAINT:  Respiratory distress   History of Present Illness:  89 year old man who presented to [facility] ED 8/4 with respiratory distress. PMHx significant for T2DM, GERD, CLL (diagnosed 2010 with LAD/splenomegaly), immune-related thrombocytopenia/ITP.  Patient was brought to ED for respiratory distress. Per family, became suddenly unresponsive at home while sitting at the table. On EMS arrival, +spontaneous respirations but unresponsive and hypotensive with SBP 60s. NS 500mL given with improvement in SBP to 100s. Required BVM ventilations with EMS and was intubated on ED arrival. In ED, patient was febrile to 104.1, HR 109, BP 150/108, RR 19. SpO2 97%. Labs were notable for WBC 3.5, Hgb 7.9 (baseline ~9-10), Plt 40 (baseline ~60s). INR 1.3. Na 136, K 3.7, CO2 24, BUN/Cr 30/2.13 (baseline Cr. 1-1.3). Mg 1.3. LFTs WNL. BNP 464, trop 25. LA 4.9 > 6.0. ABG 7.123/80.8/161/25.8. UA +small gluc/Hgb/prot. COVID positive. CXR unremarkable. CT Head Negative for acute abnormality, +mucosal thickening of sinuses/mastoid effusions and L middle ear opacification. Broad-spectrum antibiotics started.  PCCM consulted for ICU admission.  History is obtained from patient's daughters (at bedside). They report he has had increased fatigue for several months, followed by poor PO intake/lack of appetite for ~2 weeks. Recently had chills at home (unknown if febrile) and respiratory symptoms such as cough and congestion. No n/v/d or changes in bowel or urinary habits to their knowledge. At baseline, patient is reportedly independent at home, lives with his daughter who provides minimal assistance (patient still drives short distances). We briefly discussed code status and patient's daughters confirmed that he would want everything including compressions if  necessary.  Pertinent Medical History:   Past Medical History:  Diagnosis Date   Anemia    Anginal pain (HCC)    upon exertion   Arthritis    left shoulder, lower back (10/20/2016)   CLL (chronic lymphoblastic leukemia) dx'd 2000   Edema leg    Family history of adverse reaction to anesthesia    daughter's BP drops    GERD (gastroesophageal reflux disease)    History of hiatal hernia    Leukemia, chronic lymphoid (HCC) 12/08/2010   Lymphocytosis    Type II diabetes mellitus (HCC)    Significant Hospital Events: Including procedures, antibiotic start and stop dates in addition to other pertinent events   8/5 - Presented to [facility] ED via EMS for respiratory distress/unresponsiveness at home; required BVM ventilation with EMS. Intubated on ED arrival. Febrile in ED to 104F. COVID+. PCCM consulted for admission. After xfer to ICU remained on high dose pressors, POCUS hyperdynamic. IVC not collapsing. Getting CT angiogram to r/o PE. Broad spec abx continued. Added stress dose steroids. CT angio neg for PE. BC + GNR AND GPC; BCI staph epidermis, favoring primary GNR  8/6 platelet count down to 18,000, spoke with heme/ONC, giving pulse steroids today 1 g daily x 3 days, pressor requirements improved overnight, lactate slowly improving.  Remains tachycardic.  Tube feeds going.  Changing fentanyl  to Precedex  8/7- Platelet < 5, hematoma worsening, lactate slowing improving, off sedation right now- not following commands, made DNR, more than likely will transition to comfort in the next 24-48 hrs if not improving   Interim History / Subjective:  Weaning but not following commands or doing anything purposeful  Daughter at beside  Hematoma on right lower extremity expanding  Severe thrombocytopenia   Objective:  Blood pressure (!) 108/56, pulse (!) 115, temperature (!) 96.8 F (36 C), temperature source Bladder, resp. rate (!) 21, height 5' 7 (1.702 m), weight 103.5 kg, SpO2 100%. CVP:  [5  mmHg-33 mmHg] 11 mmHg  Vent Mode: PSV;CPAP FiO2 (%):  [40 %] 40 % Set Rate:  [26 bmp] 26 bmp Vt Set:  [530 mL] 530 mL PEEP:  [5 cmH20] 5 cmH20 Pressure Support:  [10 cmH20-12 cmH20] 10 cmH20 Plateau Pressure:  [23 cmH20] 23 cmH20   Intake/Output Summary (Last 24 hours) at 09/03/2023 1342 Last data filed at 09/03/2023 1336 Gross per 24 hour  Intake 3859.57 ml  Output 2378 ml  Net 1481.57 ml   Filed Weights   09/01/23 0500 09/02/23 0500 09/03/23 0500  Weight: 103 kg 101.2 kg 103.5 kg   Physical Examination: General: acute on chronic very ill elderly male, lying in icu bed on vent  HEENT: Normocephalic, PERRLA intact- sluggish, ETT, OG, Pink MM, Missing teeth  CV: s1,s2, RRR, no MRG, No JVD  pulm: clear, diminished, no distress on vent  Abs: bs active, soft  Extremities: lymphadenopathy b/l lower extremities   Skin: significant purpura, blistering lower extremities, right lower extremity large hematoma  Neuro: Rass -4, not responding to painful stimuli- minimal sedation, cough gag reflex present  GU: foley intact     Resolved Hospital Problem List:    Assessment & Plan:  Acute hypoxic and hypercarbic respiratory failure 2/2  COVID-19 infection +/-CAP P:  Continue ventilator support and lung protective strategies  Continue LTVV  Wean PEEP and Fio2 requirements to sat goal of >92%  HOB > 30 degrees Plat < 30  Aim for Driving pressures < 15  Intermittent Chest X-ray and ABGS Obtain and follow cultures-blood and tracheal aspirate VAP and PAD protocols in place  Continue cefepime  and vanc  Did wean today, but mentation not purposeful, patient critically ill, discussed with daughters at length, that patient is critically ill and has not made any significant improvement but unfortunately worsening, discussed GOC, recommend DNR- no shock, no CPR or ACLS medications if cardiac arrest, daughters in agreement- DNR order placed    Afib RVR- 8/8 suspect due to shock state, worsening  ITP- platelets <5, bleeding at right lower extremity hematoma, hgb decreasing  Initially having Sinus tachycardia, Septic shock w/ GNR bacteremia and lactic acidosis.  Echo in June this year w/ nml EF and wall motion P: Amio bolus with infusion  Cannot utilize AC due to ITP, platelets < 5, hgb decreasing  Continue levophed  infusion at this time, MAP >65   AKI, likely ATN in the setting of sepsis/hypotension Acute Kidney Injury  Improving but concerned with increased in pressor requirements/bleeding in setting of ITP if this creatinine will worsen once again  P: Continue to trend renal function daily  Continue to monitor and optimize electrolytes daily Continue to monitor urine output Continue strict I/Os Continue Adequate renal perfusion  Avoid nephrotoxic agents  Continue GOC conversation   Acute metabolic encephalopathy 2/2 sepsis and hypercarbia  P: Continue vent as above Continue supportive care Limit sedation as possible  Delirium precautions   Fluid and electrolyte imbalance: Hypocalcemia Received  calcium  gluconate on 8/7 Corrected calcium  for hypoalbuminemia- 9.1 P: Continue to follow and trend with daily BMETS  T2DM w/ hyperglycemia->anticipate will get worse w/ steroids P: Continue semglee  5 units BID Continue tube feed coverage Continue SSI, cbg q 4hrs   GERD P: Continue PPI  CLL (diagnosed 2010 with LAD/splenomegaly) Immune-related thrombocytopenia/ITP Thrombocytopenia with  spontaneous thigh hematoma Hgb 7.1 Plt <5  P: Continue steroids to 250 mg IV q6hr Received 1 unit of platelets prior to shift change, repeat plt unchanged <5 -ordered 3 units of plts, 1 unit of PRBCs   Chronic BLE lymphedema P: OT/PT consult    GOC-  Discussed with Jori and Annetta in regards to patient's current critical care status, overall prognosis, and GOC. Patient still battling septic shock with severe ITP, patient requiring levophed  gtt. Discussed with daughters that  I fear patient will continue to worsen despite our aggressive medical interventions being provided. Patient is on 2 forms of life support- vasopressors and mechanical ventilation which he is dependent upon. Recommended that if patient continues to decline or proceeds into cardiac arrest that we do not provide CPR/defib- b/c this would not provide benefit but increase suffering and harm.  Daughters in agreement. Also recommended that if patient does not have significant improvement or continues to worsen that we should focus on quality of life versus quantity, and transition to comfort care allowing the patient to die peacefully. Daughters also agreement and notifying other siblings local and out of state to come visit.  P: Revisit goals of care if patient declines, if patient deteriorates- will need to notify family to transition to comfort    Best Practice: (right click and Reselect all SmartList Selections daily)   Diet/type: NPO, started tube feeds DVT prophylaxis: SCDs - thrombocytopenic GI prophylaxis: PPI Lines: N/A Foley:  Yes, and it is still needed Code Status:  DNR  Last date of multidisciplinary goals of care discussion  GOC occurring on 8/8 with Connie and Annetta- daughters of patient     Critical care time: 60 min    Christian Remy Voiles AGACNP-BC   [facility] 09/03/2023, 2:14 PM  Please see [email or web address] for pager details.  From 7A-7P if no response, please call [phone]. After hours, please call ELink [phone].

## 2023-09-03 NOTE — Progress Notes (Signed)
 Nutrition Brief Note  Noted this am that OG tube was dislodged.  Discussed with APP, decision made to hold off on any enteral tube replacement at this time due to low platelets. Pt received platelets 8/6 at 8:50 am. Repeat platelet result remains at 5.  No further nutrition intervention planned at this time. Critical care discussing GOC with family.  Please see RD note from 8/6 if nutrition interventions warranted.    Powell SQUIBB., RD, LDN, CNSC See AMiON for contact information

## 2023-09-03 NOTE — Consult Note (Addendum)
 WOC Nurse Consult Note: Reason for Consult: Requested to assess blisters on lower extremity legs. Wound type: Partial thickness. High edema, symptoms compatible with venous insufficiency Pressure Injury POA: NA Measurement: blisters/vesicles spread on legs, ankle and knee. Wound bed: 100% red. Major of blisters are not ruptured. Clear fluid inside. Drainage (amount, consistency, odor) Minimum, serous, no odor. Periwound: Hemosiderin staining, edema 4+/4+, dry peeling skin. Dressing procedure/placement/frequency: Cleanse the skin with saline, pat dry. Apply Xeroform and cover with foam dressing, change daily or PRN. - Apply Eucerin cream daily on the dry skin. - Raise the pt legs above the level of your heart to decrease the edema.  Note: decided don't apply a compressive therapy regarding the volume of liquid can overload the heart, pt is in critical illness. Not taking diuretic.  WOC team will not plan to follow further. Please reconsult if further assistance is needed. Thank-you,  Lela Holm BSN, CNS, RN, [employer], WOCN  (Phone [phone])

## 2023-09-03 NOTE — Plan of Care (Signed)
  Problem: Coping: Goal: Psychosocial and spiritual needs will be supported Outcome: Progressing   Problem: Respiratory: Goal: Will maintain a patent airway Outcome: Progressing   Problem: Clinical Measurements: Goal: Diagnostic test results will improve Outcome: Not Progressing Goal: Cardiovascular complication will be avoided Outcome: Not Progressing   Problem: Elimination: Goal: Will not experience complications related to urinary retention Outcome: Progressing   Problem: Pain Managment: Goal: General experience of comfort will improve and/or be controlled Outcome: Progressing   Problem: Safety: Goal: Ability to remain free from injury will improve Outcome: Progressing

## 2023-09-03 NOTE — Progress Notes (Addendum)
 eLink Physician-Brief Progress Note Patient Name: Michael [name] DOB: [birth year] MRN: [id number]   Date of Service  09/03/2023  HPI/Events of Note  89 y/o gentleman with a history of CLL, Dm2, GERD who presented with respiratory failure and covid with septic shock, found to have GNR bacteremia.   OG tube was misplaced and tube feeds were spilling out of his mouth. Stable respiratory status  eICU Interventions  DC OG tube CXR already pending.   9450 - Platelets <5, Transfuse   Intervention Category Intermediate Interventions: Respiratory distress - evaluation and management  Michael [name] 09/03/2023, 3:53 AM

## 2023-09-04 DIAGNOSIS — B965 Pseudomonas (aeruginosa) (mallei) (pseudomallei) as the cause of diseases classified elsewhere: Secondary | ICD-10-CM | POA: Diagnosis not present

## 2023-09-04 DIAGNOSIS — R6521 Severe sepsis with septic shock: Secondary | ICD-10-CM | POA: Diagnosis not present

## 2023-09-04 DIAGNOSIS — U071 COVID-19: Secondary | ICD-10-CM | POA: Diagnosis not present

## 2023-09-04 DIAGNOSIS — A4152 Sepsis due to Pseudomonas: Secondary | ICD-10-CM | POA: Diagnosis not present

## 2023-09-04 LAB — COMPREHENSIVE METABOLIC PANEL WITH GFR
ALT: 20 U/L (ref 0–44)
AST: 14 U/L — ABNORMAL LOW (ref 15–41)
Albumin: <1.5 g/dL — ABNORMAL LOW (ref 3.5–5.0)
Alkaline Phosphatase: 38 U/L (ref 38–126)
Anion gap: 10 (ref 5–15)
BUN: 41 mg/dL — ABNORMAL HIGH (ref 8–23)
CO2: 20 mmol/L — ABNORMAL LOW (ref 22–32)
Calcium: 7.4 mg/dL — ABNORMAL LOW (ref 8.9–10.3)
Chloride: 105 mmol/L (ref 98–111)
Creatinine, Ser: 0.94 mg/dL (ref 0.61–1.24)
GFR, Estimated: >60 mL/min (ref >60)
Glucose, Bld: 208 mg/dL — ABNORMAL HIGH (ref 70–99)
Potassium: 4.2 mmol/L (ref 3.5–5.1)
Sodium: 135 mmol/L (ref 135–145)
Total Bilirubin: 1.1 mg/dL (ref 0.0–1.2)
Total Protein: 4.9 g/dL — ABNORMAL LOW (ref 6.5–8.1)

## 2023-09-04 LAB — PREPARE PLATELET PHERESIS
Unit division: 0
Unit division: 0
Unit division: 0
Unit division: 0

## 2023-09-04 LAB — BASIC METABOLIC PANEL WITH GFR
Anion gap: 11 (ref 5–15)
BUN: 47 mg/dL — ABNORMAL HIGH (ref 8–23)
CO2: 22 mmol/L (ref 22–32)
Calcium: 7.6 mg/dL — ABNORMAL LOW (ref 8.9–10.3)
Chloride: 104 mmol/L (ref 98–111)
Creatinine, Ser: 1.03 mg/dL (ref 0.61–1.24)
GFR, Estimated: >60 mL/min (ref >60)
Glucose, Bld: 114 mg/dL — ABNORMAL HIGH (ref 70–99)
Potassium: 3.9 mmol/L (ref 3.5–5.1)
Sodium: 137 mmol/L (ref 135–145)

## 2023-09-04 LAB — TYPE AND SCREEN
ABO/RH(D): O POS
Antibody Screen: NEGATIVE
Unit division: 0

## 2023-09-04 LAB — GLUCOSE, CAPILLARY
Glucose-Capillary: 167 mg/dL — ABNORMAL HIGH (ref 70–99)
Glucose-Capillary: 184 mg/dL — ABNORMAL HIGH (ref 70–99)
Glucose-Capillary: 67 mg/dL — ABNORMAL LOW (ref 70–99)
Glucose-Capillary: 70 mg/dL (ref 70–99)
Glucose-Capillary: 95 mg/dL (ref 70–99)
Glucose-Capillary: 96 mg/dL (ref 70–99)

## 2023-09-04 LAB — BPAM PLATELET PHERESIS
Blood Product Expiration Date: 202508112359
Blood Product Expiration Date: 202508092359
Blood Product Expiration Date: 202508102359
Blood Product Expiration Date: 202508112359
ISSUE DATE / TIME: 202508080613
ISSUE DATE / TIME: 202508081307
ISSUE DATE / TIME: 202508081616
Unit Type and Rh: 6200
Unit Type and Rh: 5100
Unit Type and Rh: 6200
Unit Type and Rh: 7300

## 2023-09-04 LAB — BPAM RBC
Blood Product Expiration Date: 202509032359
ISSUE DATE / TIME: 202508081307
Unit Type and Rh: 5100

## 2023-09-04 MED ORDER — DEXTROSE 50 % IV SOLN
INTRAVENOUS | Status: AC
  Administered 2023-09-04: 12.5 g via INTRAVENOUS
  Filled 2023-09-04: qty 50

## 2023-09-04 MED ORDER — DEXTROSE 50 % IV SOLN: 12.5000 g | INTRAVENOUS | Status: AC

## 2023-09-04 MED ORDER — FUROSEMIDE 10 MG/ML IJ SOLN
80.0000 mg | Freq: Three times a day (TID) | INTRAMUSCULAR | Status: AC
  Administered 2023-09-04 – 2023-09-05 (×3): 80 mg via INTRAVENOUS
  Filled 2023-09-04 (×3): qty 8

## 2023-09-04 MED ORDER — INSULIN GLARGINE-YFGN 100 UNIT/ML ~~LOC~~ SOLN
15.0000 [IU] | Freq: Two times a day (BID) | SUBCUTANEOUS | Status: DC
  Administered 2023-09-04: 15 [IU] via SUBCUTANEOUS
  Filled 2023-09-04 (×3): qty 0.15

## 2023-09-04 NOTE — IPAL (Signed)
  Interdisciplinary Goals of Care Family Meeting   Date carried out: 09/04/2023  Location of the meeting: Bedside  Member's involved: Physician and Family Member or next of kin  Durable Power of Attorney or Environmental health practitioner: daughter    Discussion: We discussed goals of care for Michael [name] .  I met with Michael [name] daughter today. We reviewed his ongoing care and she related that although the family is struggling with it, they are planning on transition to comfort care this afternoon. We reviewed this process and what this would look like, and they will let me know when they are ready.  Code status:   Code Status: Limited: Do not attempt resuscitation (DNR) -DNR-LIMITED -Do Not Intubate/DNI    Disposition: Continue current acute care; eventually transitioning to inpatient comfort care  Time spent for the meeting: 20 min.    Michael SHAUNNA Gaskins, DO  09/04/2023, 11:24 AM

## 2023-09-04 NOTE — Plan of Care (Signed)
  Problem: Respiratory: Goal: Will maintain a patent airway Outcome: Progressing   Problem: Elimination: Goal: Will not experience complications related to bowel motility Outcome: Progressing   Problem: Safety: Goal: Ability to remain free from injury will improve Outcome: Progressing

## 2023-09-04 NOTE — Progress Notes (Signed)
 Hypoglycemia -decrease long acting insulin  to 15 units BID, stop TF coverage  Michael SHAUNNA Gaskins, DO 09/04/23 4:21 PM [facility]

## 2023-09-04 NOTE — Progress Notes (Signed)
 NAME:  Michael [name], MRN:  [id number], DOB:  12/21/1930, LOS: 4 ADMISSION DATE:  08/30/2023 CONSULTATION DATE:  08/31/2023 REFERRING MD:  Raford - EDP, CHIEF COMPLAINT:  Respiratory distress   History of Present Illness:  89 year old man who presented to [facility] ED 8/4 with respiratory distress. PMHx significant for T2DM, GERD, CLL (diagnosed 2010 with LAD/splenomegaly), immune-related thrombocytopenia/ITP.  Patient was brought to ED for respiratory distress. Per family, became suddenly unresponsive at home while sitting at the table. On EMS arrival, +spontaneous respirations but unresponsive and hypotensive with SBP 60s. NS 500mL given with improvement in SBP to 100s. Required BVM ventilations with EMS and was intubated on ED arrival. In ED, patient was febrile to 104.1, HR 109, BP 150/108, RR 19. SpO2 97%. Labs were notable for WBC 3.5, Hgb 7.9 (baseline ~9-10), Plt 40 (baseline ~60s). INR 1.3. Na 136, K 3.7, CO2 24, BUN/Cr 30/2.13 (baseline Cr. 1-1.3). Mg 1.3. LFTs WNL. BNP 464, trop 25. LA 4.9 > 6.0. ABG 7.123/80.8/161/25.8. UA +small gluc/Hgb/prot. COVID positive. CXR unremarkable. CT Head Negative for acute abnormality, +mucosal thickening of sinuses/mastoid effusions and L middle ear opacification. Broad-spectrum antibiotics started.  PCCM consulted for ICU admission.  History is obtained from patient's daughters (at bedside). They report he has had increased fatigue for several months, followed by poor PO intake/lack of appetite for ~2 weeks. Recently had chills at home (unknown if febrile) and respiratory symptoms such as cough and congestion. No n/v/d or changes in bowel or urinary habits to their knowledge. At baseline, patient is reportedly independent at home, lives with his daughter who provides minimal assistance (patient still drives short distances). We briefly discussed code status and patient's daughters confirmed that he would want everything including compressions if  necessary.  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 Intubated on ED arrival. Febrile in ED to 104F. COVID+. PCCM consulted for admission. After xfer to ICU remained on high dose pressors, POCUS hyperdynamic. IVC not collapsing. Getting CT angiogram to r/o PE. Broad spec abx continued. Added stress dose steroids. CT angio neg for PE. BC + GNR AND GPC; BCI staph epidermis, favoring primary GNR  8/6 platelet count down to 18,000, spoke with heme/ONC, giving pulse steroids today 1 g daily x 3 days, pressor requirements improved overnight, lactate slowly improving.  Remains tachycardic.  Tube feeds going.  Changing fentanyl  to Precedex  8/7- Platelet < 5, hematoma worsening, lactate slowing improving, off sedation right now- not following commands, made DNR, more than likely will transition to comfort in the next 24-48 hrs if not improving   Interim History / Subjective:  No acute events overnight.   Objective:  Blood pressure 126/63, pulse 100, temperature (!) 100.9 F (38.3 C), resp. rate (!) 25, height 5' 7 (  1.702 m), weight 107.9 kg, SpO2 100%. CVP:  [7 mmHg-21 mmHg] 9 mmHg  Vent Mode: PRVC FiO2 (%):  [40 %] 40 % Set Rate:  [26 bmp] 26 bmp Vt Set:  [530 mL] 530 mL PEEP:  [5 cmH20] 5 cmH20 Plateau Pressure:   [21 cmH20-22 cmH20] 22 cmH20   Intake/Output Summary (Last 24 hours) at 09/04/2023 1126 Last data filed at 09/04/2023 0900 Gross per 24 hour  Intake 4154.02 ml  Output 1924 ml  Net 2230.02 ml   Filed Weights   09/02/23 0500 09/03/23 0500 09/04/23 0500  Weight: 101.2 kg 103.5 kg 107.9 kg   Physical Examination: General: critically ill appearing man lying in bed in NAD HEENT [place]/AT, eyes anicteric, ETT in place CV: S1S2, tachycardic, regular rhythm  pulm: tachypneic on SBT with labored breathing, rhales bilaterally Abs:  soft, NT Extremities:  +edema, significant lymphedema with chronic skin changes b/l shins  Skin: significant purpura, blistering lower extremities, right lower extremity large hematoma  Neuro: RASS -3, not following commands GU: foley with clear yellow urine  I/O + 13L  BUN 41 Cr 0.94 WBC 4.6 H/H 7.9/23.1 Platelets 10 Blood cultures 8/7 NGTD Blood cultures 8/5 pseudomonas, GPR- still pending, staph epi in 1/4    Resolved Hospital Problem List:    Assessment & Plan:  Acute hypoxic and hypercarbic respiratory failure 2/2  COVID-19 viral pneumonia. CT this admission with fairly minimal infiltrates relative to overall presentation. -LTVV -VAP prevention protocol -PAD protocol for sedation -daily SAT & SBT; failed -diuresis  Septic shock due to pseudomonas bacteremia- suspect portal of entry was skin breakdown on legs -vasopressors to maintain MAP >65-- still on norepi and vaso -con't cefepime   Afib with RVR, new onset due to critical illness. Back in sinus rhythm today. -no AC due to thrombocytopenia -amiodarone   AKI, likely ATN in the setting of sepsis  -diuresis -strict I/O -renally dose meds, avoid nephrotoxic meds  Acute metabolic encephalopathy 2/2 sepsis and hypercarbia  -PAD protocol -family has been diligently at bedside.   T2DM w/ hyperglycemia -semglee  20 units BID -aspart TF coverage 9 units q4h -SSI PRN -goal BG  140-180  GERD -PPI  CLL (diagnosed 2010 with LAD/splenomegaly) ITP Acute on chronic thrombocytopenia with spontaneous thigh hematoma -con't steroids, no indication for platelets today  Chronic BLE lymphedema -diuresis -would need lymphedema management as OP long-term, but if he will be transitioning to comfort care, no reason to address this acutely   See ipal today-- tentatively planning to transition to comfort later today.    Best Practice: (right click and Reselect all SmartList Selections daily)   Diet/type: tubefeeds DVT prophylaxis: SCDs - thrombocytopenic GI prophylaxis: PPI Lines: N/A Foley:  Yes, and it is still needed Code Status:  DNR  Last date of multidisciplinary goals of care discussion : 8/9      Critical care time:      This patient is critically ill with multiple organ system failure which requires frequent high complexity decision making, assessment, support, evaluation, and titration of therapies. This was completed through the application of advanced monitoring technologies and extensive interpretation of multiple databases. During this encounter critical care time was devoted to patient care services described in this note for 41 minutes.  Leita SHAUNNA Gaskins, DO 09/04/23 11:45 AM [facility]  For contact information, see Amion. If no response to pager, please call PCCM consult pager. After hours, 7PM- 7AM, please call Elink.

## 2023-09-05 ENCOUNTER — Inpatient Hospital Stay (HOSPITAL_COMMUNITY)

## 2023-09-05 DIAGNOSIS — U071 COVID-19: Secondary | ICD-10-CM | POA: Diagnosis not present

## 2023-09-05 DIAGNOSIS — A4152 Sepsis due to Pseudomonas: Secondary | ICD-10-CM | POA: Diagnosis not present

## 2023-09-05 DIAGNOSIS — B965 Pseudomonas (aeruginosa) (mallei) (pseudomallei) as the cause of diseases classified elsewhere: Secondary | ICD-10-CM | POA: Diagnosis not present

## 2023-09-05 DIAGNOSIS — R6521 Severe sepsis with septic shock: Secondary | ICD-10-CM | POA: Diagnosis not present

## 2023-09-05 LAB — BASIC METABOLIC PANEL WITH GFR
Anion gap: 12 (ref 5–15)
BUN: 57 mg/dL — ABNORMAL HIGH (ref 8–23)
CO2: 23 mmol/L (ref 22–32)
Calcium: 7.4 mg/dL — ABNORMAL LOW (ref 8.9–10.3)
Chloride: 104 mmol/L (ref 98–111)
Creatinine, Ser: 1.15 mg/dL (ref 0.61–1.24)
GFR, Estimated: >60 mL/min (ref >60)
Glucose, Bld: 100 mg/dL — ABNORMAL HIGH (ref 70–99)
Potassium: 3.8 mmol/L (ref 3.5–5.1)
Sodium: 139 mmol/L (ref 135–145)

## 2023-09-05 LAB — GLUCOSE, CAPILLARY
Glucose-Capillary: 101 mg/dL — ABNORMAL HIGH (ref 70–99)
Glucose-Capillary: 101 mg/dL — ABNORMAL HIGH (ref 70–99)
Glucose-Capillary: 118 mg/dL — ABNORMAL HIGH (ref 70–99)
Glucose-Capillary: 188 mg/dL — ABNORMAL HIGH (ref 70–99)
Glucose-Capillary: 69 mg/dL — ABNORMAL LOW (ref 70–99)
Glucose-Capillary: 78 mg/dL (ref 70–99)
Glucose-Capillary: 99 mg/dL (ref 70–99)

## 2023-09-05 MED ORDER — DEXTROSE IN LACTATED RINGERS 5 % IV SOLN: INTRAVENOUS | Status: DC

## 2023-09-05 MED ORDER — MIDAZOLAM HCL 2 MG/2ML IJ SOLN
1.0000 mg | INTRAMUSCULAR | Status: DC | PRN
  Administered 2023-09-05: 1 mg via INTRAVENOUS

## 2023-09-05 MED ORDER — POTASSIUM CHLORIDE 20 MEQ PO PACK
40.0000 meq | PACK | Freq: Once | ORAL | Status: DC
  Filled 2023-09-05: qty 2

## 2023-09-05 MED ORDER — FENTANYL 2500MCG IN NS 250ML (10MCG/ML) PREMIX INFUSION
0.0000 ug/h | INTRAVENOUS | Status: DC
  Administered 2023-09-05: 50 ug/h via INTRAVENOUS
  Filled 2023-09-05: qty 250

## 2023-09-05 MED ORDER — HALOPERIDOL LACTATE 5 MG/ML IJ SOLN: 2.5000 mg | INTRAMUSCULAR | Status: DC | PRN

## 2023-09-05 MED ORDER — GLYCOPYRROLATE 0.2 MG/ML IJ SOLN
0.2000 mg | INTRAMUSCULAR | Status: DC | PRN
  Filled 2023-09-05: qty 1

## 2023-09-05 MED ORDER — FENTANYL BOLUS VIA INFUSION
100.0000 ug | INTRAVENOUS | Status: DC | PRN
  Administered 2023-09-05 (×2): 100 ug via INTRAVENOUS

## 2023-09-05 MED ORDER — DEXTROSE 50 % IV SOLN
12.5000 g | INTRAVENOUS | Status: AC
  Administered 2023-09-05: 12.5 g via INTRAVENOUS

## 2023-09-05 MED ORDER — MAGNESIUM SULFATE 2 GM/50ML IV SOLN
2.0000 g | Freq: Once | INTRAVENOUS | Status: DC
  Administered 2023-09-05: 2 g via INTRAVENOUS
  Filled 2023-09-05: qty 50

## 2023-09-05 MED ORDER — ONDANSETRON 4 MG PO TBDP
4.0000 mg | ORAL_TABLET | Freq: Four times a day (QID) | ORAL | Status: DC | PRN
Start: 2023-09-05 — End: 2023-09-06

## 2023-09-05 MED ORDER — POLYVINYL ALCOHOL 1.4 % OP SOLN: 1.0000 [drp] | Freq: Four times a day (QID) | OPHTHALMIC | Status: DC | PRN

## 2023-09-05 MED ORDER — SODIUM CHLORIDE 0.9 % IV SOLN: INTRAVENOUS | Status: DC

## 2023-09-05 MED ORDER — GLYCOPYRROLATE 1 MG PO TABS: 1.0000 mg | ORAL_TABLET | ORAL | Status: DC | PRN

## 2023-09-05 MED ORDER — FENTANYL CITRATE PF 50 MCG/ML IJ SOSY
100.0000 ug | PREFILLED_SYRINGE | Freq: Once | INTRAMUSCULAR | Status: AC
  Administered 2023-09-05: 100 ug via INTRAVENOUS
  Filled 2023-09-05: qty 2

## 2023-09-05 MED ORDER — FUROSEMIDE 10 MG/ML IJ SOLN
160.0000 mg | Freq: Three times a day (TID) | INTRAVENOUS | Status: DC
  Filled 2023-09-05 (×3): qty 16

## 2023-09-05 MED ORDER — GLYCOPYRROLATE 0.2 MG/ML IJ SOLN: 0.2000 mg | INTRAMUSCULAR | Status: DC | PRN

## 2023-09-05 MED ORDER — MIDAZOLAM BOLUS VIA INFUSION (WITHDRAWAL LIFE SUSTAINING TX): 2.0000 mg | INTRAVENOUS | Status: DC | PRN

## 2023-09-05 MED ORDER — FUROSEMIDE 10 MG/ML IJ SOLN
INTRAMUSCULAR | Status: AC
  Filled 2023-09-05: qty 8

## 2023-09-05 MED ORDER — MIDAZOLAM-SODIUM CHLORIDE 100-0.9 MG/100ML-% IV SOLN
0.0000 mg/h | INTRAVENOUS | Status: DC
  Administered 2023-09-05: 1 mg/h via INTRAVENOUS
  Filled 2023-09-05: qty 100

## 2023-09-05 MED ORDER — DIPHENHYDRAMINE HCL 50 MG/ML IJ SOLN: 25.0000 mg | INTRAMUSCULAR | Status: DC | PRN

## 2023-09-05 MED ORDER — ONDANSETRON HCL 4 MG/2ML IJ SOLN: 4.0000 mg | Freq: Four times a day (QID) | INTRAMUSCULAR | Status: DC | PRN

## 2023-09-06 LAB — CULTURE, BLOOD (ROUTINE X 2): Special Requests: ADEQUATE

## 2023-09-06 LAB — BLOOD CULTURE ID PANEL (REFLEXED) - BCID2

## 2023-09-07 LAB — CULTURE, BLOOD (ROUTINE X 2)
Culture: NO GROWTH
Culture: NO GROWTH

## 2023-09-10 ENCOUNTER — Inpatient Hospital Stay

## 2023-09-10 ENCOUNTER — Inpatient Hospital Stay: Admitting: Hematology and Oncology

## 2023-09-15 DIAGNOSIS — I890 Lymphedema, not elsewhere classified: Secondary | ICD-10-CM | POA: Diagnosis not present

## 2023-09-17 ENCOUNTER — Inpatient Hospital Stay: Admitting: Hematology and Oncology

## 2023-09-17 ENCOUNTER — Other Ambulatory Visit: Payer: Self-pay

## 2023-09-17 ENCOUNTER — Inpatient Hospital Stay

## 2023-09-17 NOTE — Progress Notes (Signed)
 Disenrolling - Patient is Deceased

## 2023-09-24 ENCOUNTER — Inpatient Hospital Stay: Admitting: Hematology and Oncology

## 2023-09-24 ENCOUNTER — Inpatient Hospital Stay

## 2023-09-27 NOTE — Procedures (Signed)
 Extubation Procedure Note  Patient Details:   Name: Nils Thor DOB: 20-Dec-1930 MRN: [id number]   Airway Documentation:    Vent end date: 09-17-2023 Vent end time: 1440    Pt extubated to comfort care per MD order with RN and family at bedside.  Margarie CHRISTELLA Breen 09-17-23, 4:17 PM

## 2023-09-27 NOTE — IPAL (Signed)
  Interdisciplinary Goals of Care Family Meeting   Date carried out: 12-Sep-2023  Location of the meeting: Bedside  Member's involved: Physician, Bedside Registered Nurse, and Family Member or next of kin  Durable Power of Attorney or acting medical decision maker: 3 daughters (one via video chat)    Discussion: We discussed goals of care for [employer] .  Family understands his severe condition, failure to wean from MV, and they plan to withdraw aggressive life support measures. They want him to be comfortable and at peace. All questions were answered.  Code status:   Code Status: Do not attempt resuscitation (DNR) - Comfort care   Disposition: In-patient comfort care  Time spent for the meeting: 15 min    Leita SHAUNNA Gaskins, DO  September 12, 2023, 12:06 PM

## 2023-09-27 NOTE — Progress Notes (Signed)
 NAME:  Michael [name], MRN:  [id number], DOB:  Aug 03, 1930, LOS: 5 ADMISSION DATE:  08/30/2023 CONSULTATION DATE:  08/31/2023 REFERRING MD:  Raford - EDP, CHIEF COMPLAINT:  Respiratory distress   History of Present Illness:  89 year old man who presented to [facility] ED 8/4 with respiratory distress. PMHx significant for T2DM, GERD, CLL (diagnosed 2010 with LAD/splenomegaly), immune-related thrombocytopenia/ITP.  Patient was brought to ED for respiratory distress. Per family, became suddenly unresponsive at home while sitting at the table. On EMS arrival, +spontaneous respirations but unresponsive and hypotensive with SBP 60s. NS 500mL given with improvement in SBP to 100s. Required BVM ventilations with EMS and was intubated on ED arrival. In ED, patient was febrile to 104.1, HR 109, BP 150/108, RR 19. SpO2 97%. Labs were notable for WBC 3.5, Hgb 7.9 (baseline ~9-10), Plt 40 (baseline ~60s). INR 1.3. Na 136, K 3.7, CO2 24, BUN/Cr 30/2.13 (baseline Cr. 1-1.3). Mg 1.3. LFTs WNL. BNP 464, trop 25. LA 4.9 > 6.0. ABG 7.123/80.8/161/25.8. UA +small gluc/Hgb/prot. COVID positive. CXR unremarkable. CT Head Negative for acute abnormality, +mucosal thickening of sinuses/mastoid effusions and L middle ear opacification. Broad-spectrum antibiotics started.  PCCM consulted for ICU admission.  History is obtained from patient's daughters (at bedside). They report he has had increased fatigue for several months, followed by poor PO intake/lack of appetite for ~2 weeks. Recently had chills at home (unknown if febrile) and respiratory symptoms such as cough and congestion. No n/v/d or changes in bowel or urinary habits to their knowledge. At baseline, patient is reportedly independent at home, lives with his daughter who provides minimal assistance (patient still drives short distances). We briefly discussed code status and patient's daughters confirmed that he would want everything including compressions if  necessary.  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 Intubated on ED arrival. Febrile in ED to 104F. COVID+. PCCM consulted for admission. After xfer to ICU remained on high dose pressors, POCUS hyperdynamic. IVC not collapsing. Getting CT angiogram to r/o PE. Broad spec abx continued. Added stress dose steroids. CT angio neg for PE. BC + GNR AND GPC; BCI staph epidermis, favoring primary GNR  8/6 platelet count down to 18,000, spoke with heme/ONC, giving pulse steroids today 1 g daily x 3 days, pressor requirements improved overnight, lactate slowly improving.  Remains tachycardic.  Tube feeds going.  Changing fentanyl  to Precedex  8/7- Platelet < 5, hematoma worsening, lactate slowing improving, off sedation right now- not following commands, made DNR, more than likely will transition to comfort in the next 24-48 hrs if not improving   Interim History / Subjective:  Overnight no acute events. Remains on minimal sedation for vent synchrony.  Objective:  Blood pressure (!) 143/52, pulse 74, temperature 99.7 F (37.6 C), resp.  rate 19, height 5' 7 (1.702 m), weight 99.4 kg, SpO2 100%. CVP:  [9 mmHg] 9 mmHg  Vent Mode: PRVC FiO2 (%):  [40 %] 40 % Set Rate:  [26 bmp] 26 bmp Vt Set:  [530 mL] 530 mL PEEP:  [5  cmH20] 5 cmH20 Plateau Pressure:  [21 cmH20] 21 cmH20   Intake/Output Summary (Last 24 hours) at 09-06-23 1057 Last data filed at 2023/09/06 1000 Gross per 24 hour  Intake 2476.44 ml  Output 3890 ml  Net -1413.56 ml   Filed Weights   09/03/23 0500 09/04/23 0500 2023-09-06 0500  Weight: 103.5 kg 107.9 kg 99.4 kg   Physical Examination: General: critically ill appearing man lying in bed in NAD HEENT [place]/AT, eyes anicteric, ETT CV: S1S2, RRR pulm: tachypneic, low volumes and high rate on 8/5 SBT within minutes. No rhales.  Abs:  soft, NT Extremities:  +edema, lymephedema changes BLE  Skin: hematoma extension RLE,  Neuro: RASS -1 GU: foley with amber urine  I/O -500cc, still net + 12.7L  BUN 57 Cr 1.15 Blood cultures 8/7 NGTD Blood cultures 8/5 pseudomonas, GPR- still pending, staph epi in 1/4    Resolved Hospital Problem List:    Assessment & Plan:  Acute hypoxic and hypercarbic respiratory failure 2/2  COVID-19 viral pneumonia. CT this admission with fairly minimal infiltrates relative to overall presentation. Septic shock due to pseudomonas bacteremia- suspect portal of entry was skin breakdown on legs Afib with RVR, new onset due to critical illness. Back in sinus rhythm today. AKI, likely ATN in the setting of sepsis  Acute metabolic encephalopathy 2/2 sepsis and hypercarbia  T2DM w/ hyperglycemia GERD CLL (diagnosed 2010 with LAD/splenomegaly) ITP Acute on chronic thrombocytopenia with spontaneous thigh hematoma Chronic BLE lymphedema  -I examined him and updated the daughter that was at bedside this morning during my exam and his SBT.  -Later I had a family meeting after everyone arrived-- they understand his QOL will never be the same and he would not have wanted this level of care -they understand he has continued to fail vent weaning and will require meds to keep him comfortable -versed  & fentanyl  to control air hunger, pain, anxiety -planning for terminal  extubation once we ensure he is comfortable -liberalized visitation  Best Practice: (right click and Reselect all SmartList Selections daily)      Critical care time:      This patient is critically ill with multiple organ system failure which requires frequent high complexity decision making, assessment, support, evaluation, and titration of therapies. This was completed through the application of advanced monitoring technologies and extensive interpretation of multiple databases. During this encounter critical care time was devoted to patient care services described in this note for 45 minutes.  Leita SHAUNNA Gaskins, DO 09-06-23 12:25 PM [facility]  For contact information, see Amion. If no response to pager, please call PCCM consult pager. After hours, 7PM- 7AM, please call Elink.

## 2023-09-27 NOTE — Death Summary Note (Addendum)
 DEATH SUMMARY   Patient Details  Name: Michael [name] MRN: [id number] DOB: December 25, 1930  Admission/Discharge Information   Admit Date:  09/21/23  Date of Death: Date of Death: 27-Sep-2023  Time of Death: Time of Death: Oct 04, 1456  Length of Stay: 5  Referring Physician: [name], DO   Reason(s) for Hospitalization  Acute respiratory failure due to covid 19 viral pneumonia  Diagnoses  Preliminary cause of death: acute respiratory failure Secondary Diagnoses (including complications and co-morbidities):  Principal Problem:   Shock (HCC) Acute hypoxic and hypercarbic respiratory failure 2/2  COVID-19 viral pneumonia. CT this admission with fairly minimal infiltrates relative to overall presentation. Septic shock due to Pseudomonas bacteremia- suspect portal of entry was skin breakdown on legs Afib with RVR, new onset due to critical illness. Back in sinus rhythm today. AKI, likely ATN in the setting of sepsis  Acute metabolic encephalopathy 2/2 sepsis and hypercarbia  T2DM w/ hyperglycemia GERD CLL (diagnosed Oct 04, 2008 with LAD/splenomegaly) ITP Acute on chronic thrombocytopenia with spontaneous thigh hematoma Chronic BLE lymphedema  Brief Hospital Course (including significant findings, care, treatment, and services provided and events leading to death)  Michael [name] is a 89 y.o. year old male who  presented to [facility] ED 2023/09/21 with respiratory distress. PMHx significant for T2DM, GERD, CLL (diagnosed 10-04-08 with LAD/splenomegaly), immune-related thrombocytopenia/ITP.   Patient was brought to ED for respiratory distress. Per family, became suddenly unresponsive at home while sitting at the table. On EMS arrival, +spontaneous respirations but unresponsive and hypotensive with SBP 60s. NS 500mL given with improvement in SBP to 100s. Required BVM ventilations with EMS and was intubated on ED arrival. In ED, patient was febrile to 104.1, HR 109, BP 150/108, RR 19. SpO2 97%. Labs were notable for WBC  3.5, Hgb 7.9 (baseline ~9-10), Plt 40 (baseline ~60s). INR 1.3. Na 136, K 3.7, CO2 24, BUN/Cr 30/2.13 (baseline Cr. 1-1.3). Mg 1.3. LFTs WNL. BNP 464, trop 25. LA 4.9 > 6.0. ABG 7.123/80.8/161/25.8. UA +small gluc/Hgb/prot. COVID positive. CXR unremarkable. CT Head Negative for acute abnormality, +mucosal thickening of sinuses/mastoid effusions and L middle ear opacification. Broad-spectrum antibiotics started.   PCCM consulted for ICU admission.   History is obtained from patient's daughters (at bedside). They report he has had increased fatigue for several months, followed by poor PO intake/lack of appetite for ~2 weeks. Recently had chills at home (unknown if febrile) and respiratory symptoms such as cough and congestion. No n/v/d or changes in bowel or urinary habits to their knowledge. At baseline, patient is reportedly independent at home, lives with his daughter who provides minimal assistance (patient still drives short distances). We briefly discussed code status and patient's daughters confirmed that he would want everything including compressions if necessary.  Michael [name] was admitted to ICU and managed with vasopressors, broad antibiotics which were deescalated as culture information became available, and stress dose steroids. He was managed with conventional vent settings and sedation to maintain comfort and safety on the vent. PE was ruled out as a potential cause of his decompensation leading to hospitalization. Despite aggressive measures, he failed to improve significantly over several days. Due to his sepsis his platelet count fell dangerously slow, requiring platelet transfusions and pulse dose steroids to help manage his ITP. Due to failure to improve, family elected to purse comfort focused care and withdraw aggressive life support. They knew he was independent and would not want such prolonged aggressive care. He passed away in the ICU with family.  Pertinent Labs and Studies  Significant Diagnostic Studies DG CHEST PORT 1 VIEW Result Date: 09/03/2023 CLINICAL DATA:  Acute respiratory failure. EXAM: PORTABLE CHEST 1 VIEW COMPARISON:  09/01/2023 FINDINGS: Right IJ central venous catheter unchanged. Endotracheal tube tip 2.4 cm above the carina. Lungs are hypoinflated demonstrate mild hazy perihilar/infrahilar vessels likely minimal vascular congestion. Lateral left base cut off the image as the left base is not adequately evaluated on this exam. Mild hazy density over the left base. Cardiomediastinal silhouette and remainder of the exam is unchanged. IMPRESSION: 1. Hypoinflation with suggestion of minimal vascular congestion. 2. Mild hazy density over the left base which may be due to atelectasis or infection and not adequately evaluated on this exam. 3. Tubes and lines as described. Electronically Signed   By: Toribio Agreste M.D.   On: 09/03/2023 08:23   DG Chest Port 1 View Result Date: 09/01/2023 CLINICAL DATA:  Endotracheal tube, pneumonia. EXAM: PORTABLE CHEST 1 VIEW COMPARISON:  August 31, 2023. FINDINGS: The heart size and mediastinal contours are within normal limits. Endotracheal tube is in grossly good position. Nasogastric tube is seen entering stomach. Right internal jugular catheter is unchanged. Both lungs are clear. The visualized skeletal structures are unremarkable. IMPRESSION: Stable support apparatus. No acute cardiopulmonary abnormality seen. Electronically Signed   By: Lynwood Landy Raddle M.D.   On: 09/01/2023 12:39   DG Abd Portable 1V Result Date: 08/31/2023 CLINICAL DATA:  NG tube placement. EXAM: PORTABLE ABDOMEN - 1 VIEW COMPARISON:  Radiograph earlier today FINDINGS: Tip and side port of the enteric tube below the diaphragm in the stomach. Nonobstructive upper abdominal bowel gas pattern. Scattered densities project over the abdomen likely related to ingested material. IMPRESSION: Tip and side port of the enteric tube below the diaphragm in the stomach.  Electronically Signed   By: Andrea Gasman M.D.   On: 08/31/2023 15:15   DG CHEST PORT 1 VIEW Result Date: 08/31/2023 CLINICAL DATA:  Central line placement. EXAM: PORTABLE CHEST 1 VIEW COMPARISON:  Earlier today FINDINGS: Tip of the right internal jugular central venous catheter overlies the SVC. Endotracheal tube and enteric tube remain in place. No pneumothorax. Normal heart size with stable mediastinal contours. Mild bibasilar atelectasis. No pulmonary edema. Chronic shoulder arthropathy. IMPRESSION: Tip of the right internal jugular central venous catheter overlies the SVC. No pneumothorax. Electronically Signed   By: Andrea Gasman M.D.   On: 08/31/2023 15:14   DG CHEST PORT 1 VIEW Result Date: 08/31/2023 CLINICAL DATA:  Hypoxia. EXAM: PORTABLE CHEST 1 VIEW COMPARISON:  Radiograph and CT earlier today FINDINGS: Endotracheal tube tip 4 cm from the carina. Tip and side port of the enteric tube below the diaphragm in the stomach right internal jugular central venous catheter tip overlies the SVC. Pleural effusions on earlier CT are not well demonstrated by radiograph. Mild bibasilar atelectasis without confluent airspace disease. No pulmonary edema. No pneumothorax. Stable heart size and mediastinal contours. IMPRESSION: 1. Stable support apparatus. 2. Mild bibasilar atelectasis. Pleural effusions on earlier CT are not well demonstrated by radiograph. Electronically Signed   By: Andrea Gasman M.D.   On: 08/31/2023 15:13   CT Angio Chest Pulmonary Embolism (PE) W or WO Contrast Result Date: 08/31/2023 CLINICAL DATA:  Pulmonary embolism (PE) suspected, high prob. Respiratory distress. EXAM: CT ANGIOGRAPHY CHEST WITH CONTRAST TECHNIQUE: Multidetector CT imaging of the chest was performed using the standard protocol during bolus administration of intravenous contrast. Multiplanar CT image reconstructions and MIPs were obtained to evaluate the vascular anatomy. RADIATION DOSE REDUCTION: This exam  was  performed according to the departmental dose-optimization program which includes automated exposure control, adjustment of the mA and/or kV according to patient size and/or use of iterative reconstruction technique. CONTRAST:  65mL OMNIPAQUE  IOHEXOL  350 MG/ML SOLN COMPARISON:  06/11/2023 FINDINGS: Cardiovascular: Study limited by respiratory motion in the lower lobes. No visible pulmonary emboli to the segmental level. Heart is normal size. Aorta is normal caliber. Scattered coronary artery and aortic calcifications. Mediastinum/Nodes: No mediastinal, hilar, or axillary adenopathy. Trachea and esophagus are unremarkable. Thyroid  unremarkable. Endotracheal tube tip in the midtrachea. Lungs/Pleura: Trace bilateral pleural effusions. Dependent atelectasis in the lower lobes. Upper Abdomen: No acute findings. NG tube in the stomach which is decompressed. Musculoskeletal: Chest wall soft tissues are unremarkable. No acute bony abnormality. Review of the MIP images confirms the above findings. IMPRESSION: No evidence for pulmonary embolus. Study somewhat limited by respiratory motion in the lower lobes. Trace bilateral pleural effusions with dependent atelectasis. Coronary artery disease. Aortic Atherosclerosis (ICD10-I70.0). Electronically Signed   By: Franky Crease M.D.   On: 08/31/2023 14:41   CT Head Wo Contrast Result Date: 08/31/2023 EXAM: CT HEAD WITHOUT 08/31/2023 01:04:29 AM TECHNIQUE: CT of the head was performed without the administration of intravenous contrast. Automated exposure control, iterative reconstruction, and/or weight based adjustment of the mA/kV was utilized to reduce the radiation dose to as low as reasonably achievable. COMPARISON: 06/12/2023 CLINICAL HISTORY: Mental status change, unknown cause. AMS, respiratory distress. FINDINGS: BRAIN AND VENTRICLES: No acute intracranial hemorrhage. No mass effect or midline shift. No extra-axial fluid collection. Gray-white differentiation is maintained.  No hydrocephalus. Chronic microvascular ischemia and generalized atrophy. ORBITS: No acute abnormality. SINUSES AND MASTOIDS: Extensive mucosal thickening throughout the paranasal sinuses. Bilateral mastoid effusions. Opacification of the left middle ear. SOFT TISSUES AND SKULL: No acute skull fracture. No acute soft tissue abnormality. IMPRESSION: 1. No acute intracranial abnormality. 2. Extensive mucosal thickening throughout the paranasal sinuses, bilateral mastoid effusions, and opacification of the left middle ear. Correlate for sinusitis, otitis media, and mastoiditis. Electronically signed by: Norman Gatlin MD 08/31/2023 01:26 AM EDT RP Workstation: [id number]   DG Abdomen 1 View Result Date: 08/31/2023 EXAM: 1 VIEW XRAY OF THE ABDOMEN 08/31/2023 12:22:00 AM COMPARISON: None available. CLINICAL HISTORY: OG tube. Encounter for OG tube. FINDINGS: BOWEL: Nonobstructive bowel gas pattern. SOFT TISSUES: No abnormal calcifications or opaque urinary calculi. Enteric tube tip in the stomach. BONES: No acute osseous abnormality. IMPRESSION: 1. Enteric tube tip in the stomach. Electronically signed by: Norman Gatlin MD 08/31/2023 12:56 AM EDT RP Workstation: [id number]   DG Chest Port 1 View Result Date: 08/31/2023 EXAM: 1 VIEW XRAY OF THE CHEST 08/31/2023 12:21:18 AM COMPARISON: None available. CLINICAL HISTORY: Questionable sepsis - evaluate for abnormality. Encounter for intubation. FINDINGS: LUNGS AND PLEURA: No focal pulmonary opacity. No pulmonary edema. No pleural effusion. No pneumothorax. HEART AND MEDIASTINUM: No acute abnormality of the cardiac and mediastinal silhouettes. BONES AND SOFT TISSUES: No acute osseous abnormality. LINES AND TUBES: Endotracheal tube tip in the mid intrathoracic trachea 4 cm from the carina. Subdiaphragmatic enteric tube. IMPRESSION: 1. Endotracheal tube tip in the mid intrathoracic trachea. Electronically signed by: Norman Gatlin MD 08/31/2023 12:55 AM EDT RP Workstation:  [id number]    Microbiology Recent Results (from the past 240 hours)  Resp panel by RT-PCR (RSV, Flu A&B, Covid) Anterior Nasal Swab     Status: Abnormal   Collection Time: 08/31/23 12:07 AM   Specimen: Anterior Nasal Swab  Result Value Ref Range Status   SARS Coronavirus  2 by RT PCR POSITIVE (A) NEGATIVE Final   Influenza A by PCR NEGATIVE NEGATIVE Final   Influenza B by PCR NEGATIVE NEGATIVE Final    Comment: (NOTE) The Xpert Xpress SARS-CoV-2/FLU/RSV plus assay is intended as an aid in the diagnosis of influenza from Nasopharyngeal swab specimens and should not be used as a sole basis for treatment. Nasal washings and aspirates are unacceptable for Xpert Xpress SARS-CoV-2/FLU/RSV testing.  Fact Sheet for Patients: BloggerCourse.com  Fact Sheet for Healthcare Providers: SeriousBroker.it  This test is not yet approved or cleared by the [place]  FDA and has been authorized for detection and/or diagnosis of SARS-CoV-2 by FDA under an Emergency Use Authorization (EUA). This EUA will remain in effect (meaning this test can be used) for the duration of the COVID-19 declaration under Section 564(b)(1) of the Act, 21 U.S.C. section 360bbb-3(b)(1), unless the authorization is terminated or revoked.     Resp Syncytial Virus by PCR NEGATIVE NEGATIVE Final    Comment: (NOTE) Fact Sheet for Patients: BloggerCourse.com  Fact Sheet for Healthcare Providers: SeriousBroker.it  This test is not yet approved or cleared by the [place]  FDA and has been authorized for detection and/or diagnosis of SARS-CoV-2 by FDA under an Emergency Use Authorization (EUA). This EUA will remain in effect (meaning this test can be used) for the duration of the COVID-19 declaration under Section 564(b)(1) of the Act, 21 U.S.C. section 360bbb-3(b)(1), unless the authorization is terminated  or revoked.  Performed at [facility], [address] [place], [place] [address]   Blood Culture (routine x 2)     Status: Abnormal   Collection Time: 08/31/23 12:07 AM   Specimen: BLOOD RIGHT HAND  Result Value Ref Range Status   Specimen Description BLOOD RIGHT HAND  Final   Special Requests   Final    BOTTLES DRAWN AEROBIC AND ANAEROBIC Blood Culture adequate volume   Culture  Setup Time   Final    GRAM NEGATIVE RODS IN BOTH AEROBIC AND ANAEROBIC BOTTLES GRAM POSITIVE COCCI IN CLUSTERS ANAEROBIC BOTTLE ONLY CRITICAL RESULT CALLED TO, READ BACK BY AND VERIFIED WITH: PHARMD VSABRA DAUPHIN [id number] @ 2314 FH    Culture (A)  Final    PSEUDOMONAS SPECIES SUSCEPTIBILITIES PERFORMED ON PREVIOUS CULTURE WITHIN THE LAST 5 DAYS. STAPHYLOCOCCUS EPIDERMIDIS THE SIGNIFICANCE OF ISOLATING THIS ORGANISM FROM A SINGLE SET OF BLOOD CULTURES WHEN MULTIPLE SETS ARE DRAWN IS UNCERTAIN. PLEASE NOTIFY THE MICROBIOLOGY DEPARTMENT WITHIN ONE WEEK IF SPECIATION AND SENSITIVITIES ARE REQUIRED. Performed at [facility], [address], [place], [place] [address]    Report Status 09/02/2023 FINAL  Final  Blood Culture ID Panel (Reflexed)     Status: Abnormal   Collection Time: 08/31/23 12:07 AM  Result Value Ref Range Status   Enterococcus faecalis NOT DETECTED NOT DETECTED Final   Enterococcus Faecium NOT DETECTED NOT DETECTED Final   Listeria monocytogenes NOT DETECTED NOT DETECTED Final   Staphylococcus species DETECTED (A) NOT DETECTED Final    Comment: CRITICAL RESULT CALLED TO, READ BACK BY AND VERIFIED WITH: PHARMD VSABRA DAUPHIN [id number] @ 2314 FH    Staphylococcus aureus (BCID) NOT DETECTED NOT DETECTED Final   Staphylococcus epidermidis DETECTED (A) NOT DETECTED Final    Comment: Methicillin (oxacillin) resistant coagulase negative staphylococcus. Possible blood culture contaminant (unless isolated from more than one blood culture draw or clinical case suggests pathogenicity). No antibiotic  treatment is indicated for blood  culture contaminants. CRITICAL RESULT CALLED TO, READ BACK BY  AND VERIFIED WITH: MAYA ALONSO DAUPHIN [id number] @ 2314 FH    Staphylococcus lugdunensis NOT DETECTED NOT DETECTED Final   Streptococcus species NOT DETECTED NOT DETECTED Final   Streptococcus agalactiae NOT DETECTED NOT DETECTED Final   Streptococcus pneumoniae NOT DETECTED NOT DETECTED Final   Streptococcus pyogenes NOT DETECTED NOT DETECTED Final   A.calcoaceticus-baumannii NOT DETECTED NOT DETECTED Final   Bacteroides fragilis NOT DETECTED NOT DETECTED Final   Enterobacterales NOT DETECTED NOT DETECTED Final   Enterobacter cloacae complex NOT DETECTED NOT DETECTED Final   Escherichia coli NOT DETECTED NOT DETECTED Final   Klebsiella aerogenes NOT DETECTED NOT DETECTED Final   Klebsiella oxytoca NOT DETECTED NOT DETECTED Final   Klebsiella pneumoniae NOT DETECTED NOT DETECTED Final   Proteus species NOT DETECTED NOT DETECTED Final   Salmonella species NOT DETECTED NOT DETECTED Final   Serratia marcescens NOT DETECTED NOT DETECTED Final   Haemophilus influenzae NOT DETECTED NOT DETECTED Final   Neisseria meningitidis NOT DETECTED NOT DETECTED Final   Pseudomonas aeruginosa NOT DETECTED NOT DETECTED Final   Stenotrophomonas maltophilia NOT DETECTED NOT DETECTED Final   Candida albicans NOT DETECTED NOT DETECTED Final   Candida auris NOT DETECTED NOT DETECTED Final   Candida glabrata NOT DETECTED NOT DETECTED Final   Candida krusei NOT DETECTED NOT DETECTED Final   Candida parapsilosis NOT DETECTED NOT DETECTED Final   Candida tropicalis NOT DETECTED NOT DETECTED Final   Cryptococcus neoformans/gattii NOT DETECTED NOT DETECTED Final   Methicillin resistance mecA/C DETECTED (A) NOT DETECTED Final    Comment: CRITICAL RESULT CALLED TO, READ BACK BY AND VERIFIED WITH: MAYA ALONSO DAUPHIN [id number] @ 2314 FH Performed at [facility], [address], [place], [place] [address]   Blood Culture  (routine x 2)     Status: Abnormal (Preliminary result)   Collection Time: 08/31/23 12:23 AM   Specimen: BLOOD  Result Value Ref Range Status   Specimen Description BLOOD LEFT ANTECUBITAL  Final   Special Requests   Final    BOTTLES DRAWN AEROBIC AND ANAEROBIC Blood Culture adequate volume   Culture  Setup Time   Final    GRAM NEGATIVE RODS IN BOTH AEROBIC AND ANAEROBIC BOTTLES CRITICAL RESULT CALLED TO, READ BACK BY AND VERIFIED WITH: PHARMD ELIZABETH MARTIN ON 08/31/23 @ 1426 BY DRT GRAM POSITIVE RODS ANAEROBIC BOTTLE ONLY CRITICAL RESULT CALLED TO, READ BACK BY AND VERIFIED WITH: PHARMD J LEDFORD 09/04/2023 @ 0703 BY AB Performed at [facility], [address], [place], [place] [address]    Culture PSEUDOMONAS SPECIES GRAM POSITIVE RODS  (A)  Final   Report Status PENDING  Incomplete   Organism ID, Bacteria PSEUDOMONAS SPECIES  Final      Susceptibility   Pseudomonas species - MIC*    CEFTAZIDIME 4 SENSITIVE Sensitive     CIPROFLOXACIN <=0.25 SENSITIVE Sensitive     GENTAMICIN <=1 SENSITIVE Sensitive     IMIPENEM 2 SENSITIVE Sensitive     PIP/TAZO <=4 SENSITIVE Sensitive ug/mL    CEFEPIME  1 SENSITIVE Sensitive     * PSEUDOMONAS SPECIES  Blood Culture ID Panel (Reflexed)     Status: None   Collection Time: 08/31/23 12:23 AM  Result Value Ref Range Status   Enterococcus faecalis NOT DETECTED NOT DETECTED Final   Enterococcus Faecium NOT DETECTED NOT DETECTED Final   Listeria monocytogenes NOT DETECTED NOT DETECTED Final   Staphylococcus species NOT DETECTED NOT DETECTED Final   Staphylococcus aureus (BCID) NOT DETECTED NOT DETECTED  Final   Staphylococcus epidermidis NOT DETECTED NOT DETECTED Final   Staphylococcus lugdunensis NOT DETECTED NOT DETECTED Final   Streptococcus species NOT DETECTED NOT DETECTED Final   Streptococcus agalactiae NOT DETECTED NOT DETECTED Final   Streptococcus pneumoniae NOT DETECTED NOT DETECTED Final   Streptococcus pyogenes NOT DETECTED NOT  DETECTED Final   A.calcoaceticus-baumannii NOT DETECTED NOT DETECTED Final   Bacteroides fragilis NOT DETECTED NOT DETECTED Final   Enterobacterales NOT DETECTED NOT DETECTED Final   Enterobacter cloacae complex NOT DETECTED NOT DETECTED Final   Escherichia coli NOT DETECTED NOT DETECTED Final   Klebsiella aerogenes NOT DETECTED NOT DETECTED Final   Klebsiella oxytoca NOT DETECTED NOT DETECTED Final   Klebsiella pneumoniae NOT DETECTED NOT DETECTED Final   Proteus species NOT DETECTED NOT DETECTED Final   Salmonella species NOT DETECTED NOT DETECTED Final   Serratia marcescens NOT DETECTED NOT DETECTED Final   Haemophilus influenzae NOT DETECTED NOT DETECTED Final   Neisseria meningitidis NOT DETECTED NOT DETECTED Final   Pseudomonas aeruginosa NOT DETECTED NOT DETECTED Final   Stenotrophomonas maltophilia NOT DETECTED NOT DETECTED Final   Candida albicans NOT DETECTED NOT DETECTED Final   Candida auris NOT DETECTED NOT DETECTED Final   Candida glabrata NOT DETECTED NOT DETECTED Final   Candida krusei NOT DETECTED NOT DETECTED Final   Candida parapsilosis NOT DETECTED NOT DETECTED Final   Candida tropicalis NOT DETECTED NOT DETECTED Final   Cryptococcus neoformans/gattii NOT DETECTED NOT DETECTED Final    Comment: Performed at [facility], [address], [place], [place] [address]  MRSA Next Gen by PCR, Nasal     Status: None   Collection Time: 08/31/23  5:56 AM  Result Value Ref Range Status   MRSA by PCR Next Gen NOT DETECTED NOT DETECTED Final    Comment: (NOTE) The GeneXpert MRSA Assay (FDA approved for NASAL specimens only), is one component of a comprehensive MRSA colonization surveillance program. It is not intended to diagnose MRSA infection nor to guide or monitor treatment for MRSA infections. Test performance is not FDA approved in patients less than 55 years old. Performed at [facility], [address], [place], [place] [address]   Culture, blood  (Routine X 2) w Reflex to ID Panel     Status: None (Preliminary result)   Collection Time: 09/02/23  7:31 PM   Specimen: BLOOD RIGHT HAND  Result Value Ref Range Status   Specimen Description BLOOD RIGHT HAND  Final   Special Requests   Final    BOTTLES DRAWN AEROBIC ONLY Blood Culture results may not be optimal due to an inadequate volume of blood received in culture bottles   Culture   Final    NO GROWTH 3 DAYS Performed at [facility], [address] [place], [place] [address]    Report Status PENDING  Incomplete  Culture, blood (Routine X 2) w Reflex to ID Panel     Status: None (Preliminary result)   Collection Time: 09/02/23  7:31 PM   Specimen: BLOOD RIGHT HAND  Result Value Ref Range Status   Specimen Description BLOOD RIGHT HAND  Final   Special Requests   Final    BOTTLES DRAWN AEROBIC ONLY Blood Culture results may not be optimal due to an inadequate volume of blood received in culture bottles   Culture   Final    NO GROWTH 3 DAYS Performed at [facility], [address] [place], [place] [address]  Report Status PENDING  Incomplete    Lab Basic Metabolic Panel: Recent Labs  Lab 08/31/23 1057 08/31/23 1211 08/31/23 1533 08/31/23 1700 09/01/23 0400 09/01/23 0834 09/02/23 0414 09/03/23 0404 09/04/23 0400 09/04/23 1808 09/20/2023 1056  NA 135   < > 134* 132* 132*   < > 133* 136 135 137 139  K 3.5   < > 4.2 4.3 4.3   < > 4.7 4.3 4.2 3.9 3.8  CL 86*  --  100 99 100  --  103 105 105 104 104  CO2 33*  --  19* 19* 20*  --  22 23 20* 22 23  GLUCOSE 626*  --  151* 158* 206*  --  229* 180* 208* 114* 100*  BUN 29*  --  34* 36* 39*  --  36* 34* 41* 47* 57*  CREATININE 2.01*  --  2.07* 1.94* 1.78*  --  1.11 0.94 0.94 1.03 1.15  CALCIUM  5.7*  --  6.6* 7.3* 7.3*  --  7.5* 7.5* 7.4* 7.6* 7.4*  MG 1.2*  --  1.4*  --  2.5*  --  2.5* 2.5*  --   --   --   PHOS 4.1  --  4.5 4.5 4.7*  --  3.0 2.3*  --   --   --    < > = values in this interval not displayed.    Liver Function Tests: Recent Labs  Lab 08/31/23 0009 08/31/23 1533 08/31/23 1700 09/01/23 0400 09/02/23 0414 09/04/23 0400  AST 33 51*  --  47* 53* 14*  ALT 16 19  --  21 22 20   ALKPHOS 38 45  --  52 57 38  BILITOT 1.1 1.0  --  1.0 1.1 1.1  PROT 5.1* 4.6*  --  4.5* 4.7* 4.9*  ALBUMIN 2.0* 1.6* <1.5* <1.5* <1.5* <1.5*   No results for input(s): LIPASE, AMYLASE in the last 168 hours. No results for input(s): AMMONIA in the last 168 hours. CBC: Recent Labs  Lab 08/31/23 0009 08/31/23 0044 08/31/23 1057 08/31/23 1211 09/01/23 0400 09/01/23 0834 09/03/23 0404 09/03/23 1110 09/03/23 1730  WBC 3.5*  --  6.5  --  7.4  --  4.2 4.2 4.6  NEUTROABS 0.5*  --   --   --   --   --   --   --   --   HGB 7.9*   < > 7.7*   < > 9.4* 9.2* 7.5* 7.1* 7.9*  HCT 25.5*   < > 24.7*   < > 29.2* 27.0* 22.8* 21.4* 23.1*  MCV 99.2  --  98.8  --  96.7  --  93.8 93.4 90.9  PLT 40*  --  27*  --  18*  --  <5* 5* 10*   < > = values in this interval not displayed.   Cardiac Enzymes: No results for input(s): CKTOTAL, CKMB, CKMBINDEX, TROPONINI in the last 168 hours. Sepsis Labs: Recent Labs  Lab 08/31/23 1057 08/31/23 1535 09/01/23 0400 09/01/23 1013 09/02/23 0855 09/03/23 0404 09/03/23 1110 09/03/23 1730  PROCALCITON 75.20  --   --   --   --   --   --   --   WBC 6.5  --  7.4  --   --  4.2 4.2 4.6  LATICACIDVEN 7.2*   < > 4.8* 3.9* 2.6* 2.0*  --   --    < > = values in this interval not displayed.    Procedures/Operations  [facility] A-line  Leita SHAUNNA Gaskins 10-01-23, 3:17 PM

## 2023-09-27 DEATH — deceased

## 2023-10-01 ENCOUNTER — Inpatient Hospital Stay: Admitting: Hematology and Oncology

## 2023-10-01 ENCOUNTER — Inpatient Hospital Stay

## 2023-10-08 ENCOUNTER — Inpatient Hospital Stay

## 2023-10-08 ENCOUNTER — Inpatient Hospital Stay: Admitting: Hematology and Oncology

## 2023-10-15 ENCOUNTER — Inpatient Hospital Stay: Admitting: Hematology and Oncology

## 2023-10-15 ENCOUNTER — Inpatient Hospital Stay
